# Patient Record
Sex: Female | Born: 1960 | Race: Black or African American | Hispanic: No | Marital: Married | Smoking: Current every day smoker
Health system: Southern US, Community
[De-identification: ages and names within clinical notes are randomized; demographics above are authoritative.]

## PROBLEM LIST (undated history)

## (undated) DIAGNOSIS — M810 Age-related osteoporosis without current pathological fracture: Secondary | ICD-10-CM

## (undated) DIAGNOSIS — M51369 Other intervertebral disc degeneration, lumbar region without mention of lumbar back pain or lower extremity pain: Secondary | ICD-10-CM

## (undated) DIAGNOSIS — C801 Malignant (primary) neoplasm, unspecified: Secondary | ICD-10-CM

## (undated) DIAGNOSIS — D573 Sickle-cell trait: Secondary | ICD-10-CM

## (undated) DIAGNOSIS — D649 Anemia, unspecified: Secondary | ICD-10-CM

## (undated) DIAGNOSIS — L0292 Furuncle, unspecified: Secondary | ICD-10-CM

## (undated) DIAGNOSIS — I10 Essential (primary) hypertension: Secondary | ICD-10-CM

## (undated) DIAGNOSIS — E7800 Pure hypercholesterolemia, unspecified: Secondary | ICD-10-CM

## (undated) DIAGNOSIS — M1990 Unspecified osteoarthritis, unspecified site: Secondary | ICD-10-CM

## (undated) DIAGNOSIS — L0293 Carbuncle, unspecified: Secondary | ICD-10-CM

## (undated) DIAGNOSIS — D759 Disease of blood and blood-forming organs, unspecified: Secondary | ICD-10-CM

## (undated) HISTORY — PX: BRAIN SURGERY: SHX531

## (undated) HISTORY — DX: Carbuncle, unspecified: L02.93

## (undated) HISTORY — DX: Furuncle, unspecified: L02.92

## (undated) HISTORY — PX: THERAPEUTIC ABORTION: SHX798

## (undated) HISTORY — DX: Unspecified osteoarthritis, unspecified site: M19.90

## (undated) HISTORY — DX: Anemia, unspecified: D64.9

## (undated) HISTORY — PX: APPENDECTOMY: SHX54

---

## 2001-01-21 ENCOUNTER — Encounter: Payer: Self-pay | Admitting: Emergency Medicine

## 2001-01-21 ENCOUNTER — Emergency Department (HOSPITAL_COMMUNITY): Admission: EM | Admit: 2001-01-21 | Discharge: 2001-01-21 | Payer: Self-pay | Admitting: Emergency Medicine

## 2001-11-25 ENCOUNTER — Emergency Department (HOSPITAL_COMMUNITY): Admission: EM | Admit: 2001-11-25 | Discharge: 2001-11-25 | Payer: Self-pay | Admitting: *Deleted

## 2002-02-03 ENCOUNTER — Encounter: Payer: Self-pay | Admitting: *Deleted

## 2002-02-03 ENCOUNTER — Emergency Department (HOSPITAL_COMMUNITY): Admission: EM | Admit: 2002-02-03 | Discharge: 2002-02-03 | Payer: Self-pay | Admitting: *Deleted

## 2002-02-11 ENCOUNTER — Encounter: Payer: Self-pay | Admitting: Obstetrics and Gynecology

## 2002-02-11 ENCOUNTER — Ambulatory Visit (HOSPITAL_COMMUNITY): Admission: RE | Admit: 2002-02-11 | Discharge: 2002-02-11 | Payer: Self-pay | Admitting: Obstetrics and Gynecology

## 2002-09-05 ENCOUNTER — Emergency Department (HOSPITAL_COMMUNITY): Admission: EM | Admit: 2002-09-05 | Discharge: 2002-09-05 | Payer: Self-pay | Admitting: Emergency Medicine

## 2002-12-15 ENCOUNTER — Emergency Department (HOSPITAL_COMMUNITY): Admission: EM | Admit: 2002-12-15 | Discharge: 2002-12-15 | Payer: Self-pay | Admitting: Emergency Medicine

## 2003-06-14 ENCOUNTER — Emergency Department (HOSPITAL_COMMUNITY): Admission: EM | Admit: 2003-06-14 | Discharge: 2003-06-14 | Payer: Self-pay | Admitting: Emergency Medicine

## 2003-08-06 ENCOUNTER — Emergency Department (HOSPITAL_COMMUNITY): Admission: EM | Admit: 2003-08-06 | Discharge: 2003-08-06 | Payer: Self-pay | Admitting: Emergency Medicine

## 2003-09-10 ENCOUNTER — Ambulatory Visit (HOSPITAL_COMMUNITY): Admission: RE | Admit: 2003-09-10 | Discharge: 2003-09-10 | Payer: Self-pay | Admitting: Internal Medicine

## 2003-12-22 ENCOUNTER — Emergency Department (HOSPITAL_COMMUNITY): Admission: EM | Admit: 2003-12-22 | Discharge: 2003-12-22 | Payer: Self-pay | Admitting: *Deleted

## 2004-04-03 ENCOUNTER — Emergency Department (HOSPITAL_COMMUNITY): Admission: EM | Admit: 2004-04-03 | Discharge: 2004-04-03 | Payer: Self-pay | Admitting: Emergency Medicine

## 2004-12-16 ENCOUNTER — Emergency Department (HOSPITAL_COMMUNITY): Admission: EM | Admit: 2004-12-16 | Discharge: 2004-12-16 | Payer: Self-pay | Admitting: *Deleted

## 2005-03-28 ENCOUNTER — Emergency Department (HOSPITAL_COMMUNITY): Admission: EM | Admit: 2005-03-28 | Discharge: 2005-03-28 | Payer: Self-pay | Admitting: Emergency Medicine

## 2005-09-25 ENCOUNTER — Emergency Department (HOSPITAL_COMMUNITY): Admission: EM | Admit: 2005-09-25 | Discharge: 2005-09-25 | Payer: Self-pay | Admitting: *Deleted

## 2005-11-23 ENCOUNTER — Emergency Department (HOSPITAL_COMMUNITY): Admission: EM | Admit: 2005-11-23 | Discharge: 2005-11-23 | Payer: Self-pay | Admitting: Emergency Medicine

## 2006-01-05 ENCOUNTER — Emergency Department (HOSPITAL_COMMUNITY): Admission: EM | Admit: 2006-01-05 | Discharge: 2006-01-05 | Payer: Self-pay | Admitting: Emergency Medicine
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
## 2006-02-02 ENCOUNTER — Emergency Department (HOSPITAL_COMMUNITY): Admission: EM | Admit: 2006-02-02 | Discharge: 2006-02-02 | Payer: Self-pay | Admitting: Emergency Medicine

## 2006-03-07 ENCOUNTER — Emergency Department (HOSPITAL_COMMUNITY): Admission: EM | Admit: 2006-03-07 | Discharge: 2006-03-07 | Payer: Self-pay | Admitting: Emergency Medicine

## 2006-03-28 ENCOUNTER — Emergency Department (HOSPITAL_COMMUNITY): Admission: EM | Admit: 2006-03-28 | Discharge: 2006-03-28 | Payer: Self-pay | Admitting: Emergency Medicine

## 2006-04-24 ENCOUNTER — Emergency Department (HOSPITAL_COMMUNITY): Admission: EM | Admit: 2006-04-24 | Discharge: 2006-04-24 | Payer: Self-pay | Admitting: Emergency Medicine

## 2006-06-17 ENCOUNTER — Emergency Department (HOSPITAL_COMMUNITY): Admission: EM | Admit: 2006-06-17 | Discharge: 2006-06-17 | Payer: Self-pay | Admitting: Emergency Medicine

## 2006-06-19 ENCOUNTER — Emergency Department (HOSPITAL_COMMUNITY): Admission: EM | Admit: 2006-06-19 | Discharge: 2006-06-19 | Payer: Self-pay | Admitting: Emergency Medicine

## 2006-06-30 ENCOUNTER — Emergency Department (HOSPITAL_COMMUNITY): Admission: EM | Admit: 2006-06-30 | Discharge: 2006-06-30 | Payer: Self-pay | Admitting: Emergency Medicine

## 2006-07-09 ENCOUNTER — Emergency Department (HOSPITAL_COMMUNITY): Admission: EM | Admit: 2006-07-09 | Discharge: 2006-07-09 | Payer: Self-pay | Admitting: Emergency Medicine

## 2006-07-16 ENCOUNTER — Emergency Department (HOSPITAL_COMMUNITY): Admission: EM | Admit: 2006-07-16 | Discharge: 2006-07-16 | Payer: Self-pay | Admitting: Emergency Medicine

## 2006-07-17 ENCOUNTER — Emergency Department (HOSPITAL_COMMUNITY): Admission: EM | Admit: 2006-07-17 | Discharge: 2006-07-17 | Payer: Self-pay | Admitting: Emergency Medicine

## 2006-07-25 ENCOUNTER — Emergency Department (HOSPITAL_COMMUNITY): Admission: EM | Admit: 2006-07-25 | Discharge: 2006-07-25 | Payer: Self-pay | Admitting: Emergency Medicine

## 2006-08-12 ENCOUNTER — Emergency Department (HOSPITAL_COMMUNITY): Admission: EM | Admit: 2006-08-12 | Discharge: 2006-08-12 | Payer: Self-pay | Admitting: Emergency Medicine

## 2006-08-18 ENCOUNTER — Emergency Department (HOSPITAL_COMMUNITY): Admission: EM | Admit: 2006-08-18 | Discharge: 2006-08-18 | Payer: Self-pay | Admitting: Emergency Medicine

## 2006-09-04 ENCOUNTER — Ambulatory Visit (HOSPITAL_COMMUNITY): Admission: RE | Admit: 2006-09-04 | Discharge: 2006-09-04 | Payer: Self-pay | Admitting: Family Medicine

## 2006-10-08 ENCOUNTER — Emergency Department (HOSPITAL_COMMUNITY): Admission: EM | Admit: 2006-10-08 | Discharge: 2006-10-08 | Payer: Self-pay | Admitting: Emergency Medicine

## 2006-12-29 ENCOUNTER — Emergency Department (HOSPITAL_COMMUNITY): Admission: EM | Admit: 2006-12-29 | Discharge: 2006-12-29 | Payer: Self-pay | Admitting: Emergency Medicine

## 2007-02-03 ENCOUNTER — Emergency Department (HOSPITAL_COMMUNITY): Admission: EM | Admit: 2007-02-03 | Discharge: 2007-02-03 | Payer: Self-pay | Admitting: Emergency Medicine

## 2007-03-05 ENCOUNTER — Observation Stay (HOSPITAL_COMMUNITY): Admission: RE | Admit: 2007-03-05 | Discharge: 2007-03-06 | Payer: Self-pay | Admitting: General Surgery

## 2007-03-05 ENCOUNTER — Encounter (INDEPENDENT_AMBULATORY_CARE_PROVIDER_SITE_OTHER): Payer: Self-pay | Admitting: General Surgery

## 2007-03-07 ENCOUNTER — Encounter (HOSPITAL_COMMUNITY): Admission: RE | Admit: 2007-03-07 | Discharge: 2007-04-02 | Payer: Self-pay | Admitting: General Surgery

## 2007-04-05 ENCOUNTER — Emergency Department (HOSPITAL_COMMUNITY): Admission: EM | Admit: 2007-04-05 | Discharge: 2007-04-05 | Payer: Self-pay | Admitting: Emergency Medicine

## 2007-05-01 ENCOUNTER — Emergency Department (HOSPITAL_COMMUNITY): Admission: EM | Admit: 2007-05-01 | Discharge: 2007-05-01 | Payer: Self-pay | Admitting: Emergency Medicine

## 2007-10-21 ENCOUNTER — Emergency Department (HOSPITAL_COMMUNITY): Admission: EM | Admit: 2007-10-21 | Discharge: 2007-10-21 | Payer: Self-pay | Admitting: Emergency Medicine

## 2007-11-08 ENCOUNTER — Emergency Department (HOSPITAL_COMMUNITY): Admission: EM | Admit: 2007-11-08 | Discharge: 2007-11-08 | Payer: Self-pay | Admitting: Emergency Medicine

## 2007-12-10 ENCOUNTER — Emergency Department (HOSPITAL_COMMUNITY): Admission: EM | Admit: 2007-12-10 | Discharge: 2007-12-10 | Payer: Self-pay | Admitting: Emergency Medicine

## 2008-01-19 ENCOUNTER — Emergency Department (HOSPITAL_COMMUNITY): Admission: EM | Admit: 2008-01-19 | Discharge: 2008-01-19 | Payer: Self-pay | Admitting: Emergency Medicine

## 2008-02-23 ENCOUNTER — Emergency Department (HOSPITAL_COMMUNITY): Admission: EM | Admit: 2008-02-23 | Discharge: 2008-02-23 | Payer: Self-pay | Admitting: Emergency Medicine

## 2008-03-11 ENCOUNTER — Emergency Department (HOSPITAL_COMMUNITY): Admission: EM | Admit: 2008-03-11 | Discharge: 2008-03-11 | Payer: Self-pay | Admitting: Emergency Medicine

## 2008-03-13 ENCOUNTER — Emergency Department (HOSPITAL_COMMUNITY): Admission: EM | Admit: 2008-03-13 | Discharge: 2008-03-13 | Payer: Self-pay | Admitting: Emergency Medicine

## 2008-04-22 ENCOUNTER — Emergency Department (HOSPITAL_COMMUNITY): Admission: EM | Admit: 2008-04-22 | Discharge: 2008-04-22 | Payer: Self-pay | Admitting: Emergency Medicine

## 2008-04-22 IMAGING — CT CT CHEST W/ CM
1 series · 15 of 33 positions shown, 19 images · IV contrast (Omnipaque 300)
Comparison: Prior plain films including earlier today.

CLINICAL DATA: Chest tightness.  Cough.  Headache.  Possible
density in the right lung on the plain film.  Smoker.

CT CHEST WITH CONTRAST
TECHNIQUE: Multidetector CT imaging of the chest was performed
following the standard protocol during bolus administration of
intravenous contrast.
Contrast: 80 ml [A2]

[Series 2: chestroutine 5.0 b40f · axial · 0.70mm/px · z∈[-333,-68]mm · 15 of 63 slices shown, 19 images]
[im 5/63  mediastinal]
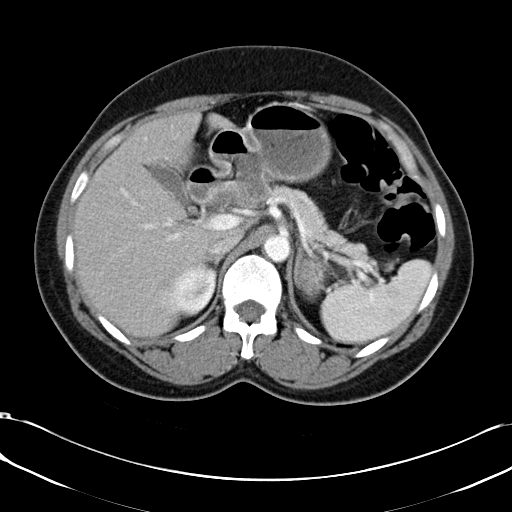
[im 5/63  lung]
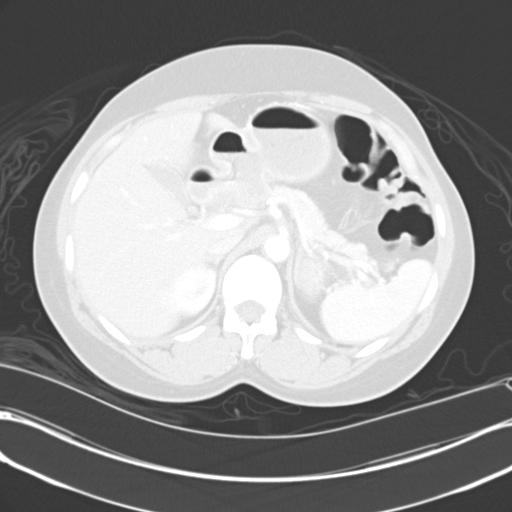
[im 10/63  lung]
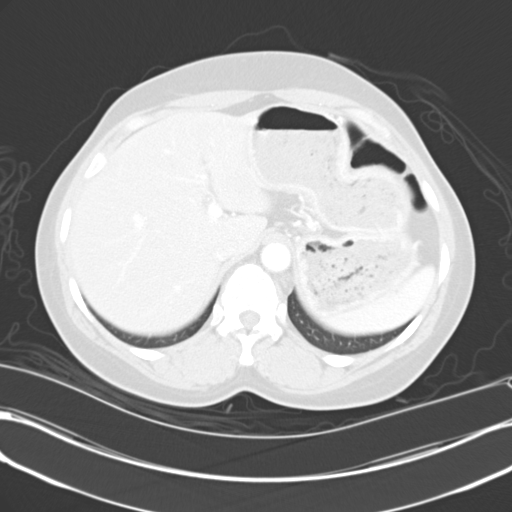
[im 13/63  lung]
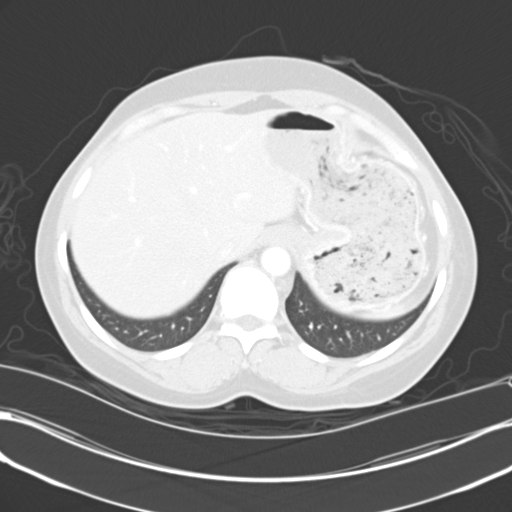
[im 17/63  lung]
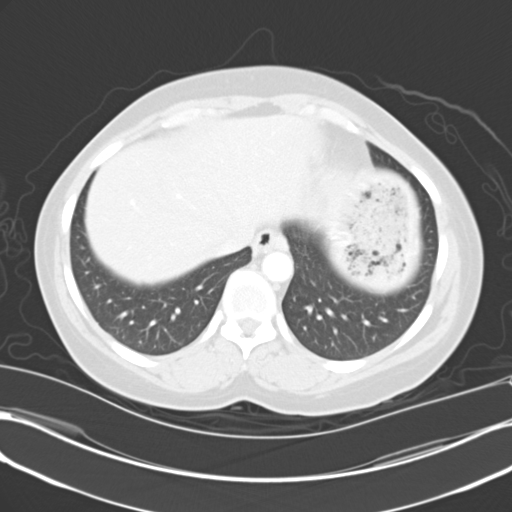
[im 21/63  mediastinal]
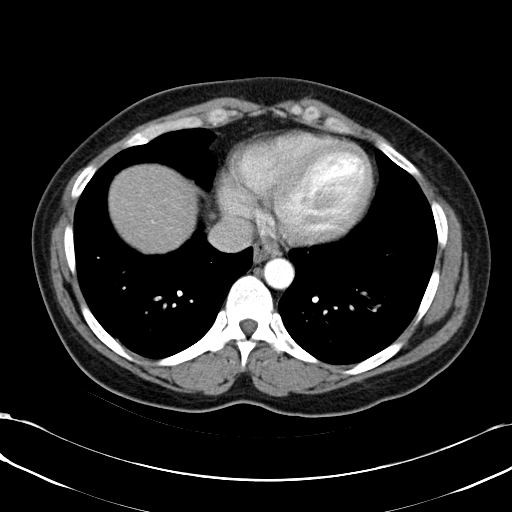
[im 21/63  lung]
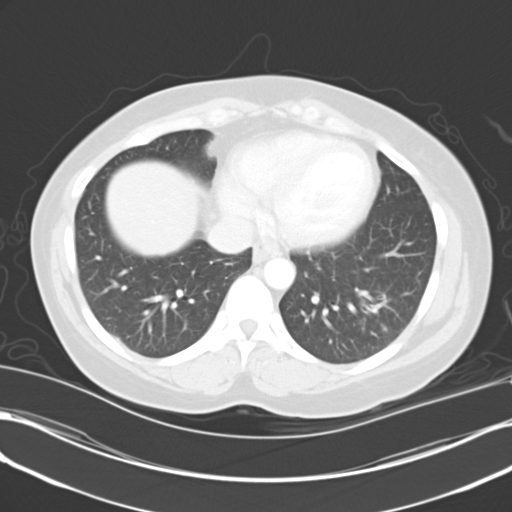
[im 25/63  lung]
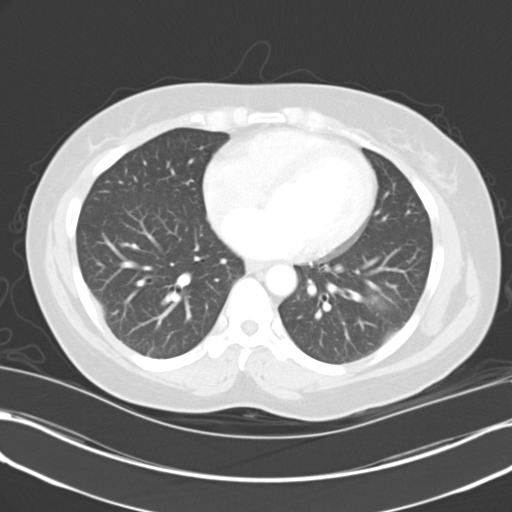
[im 28/63  lung]
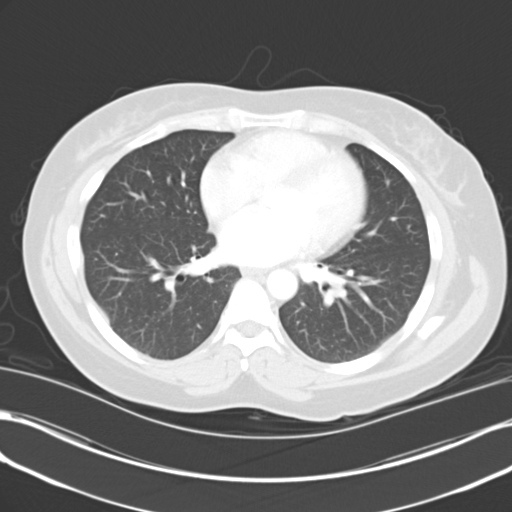
[im 33/63  lung]
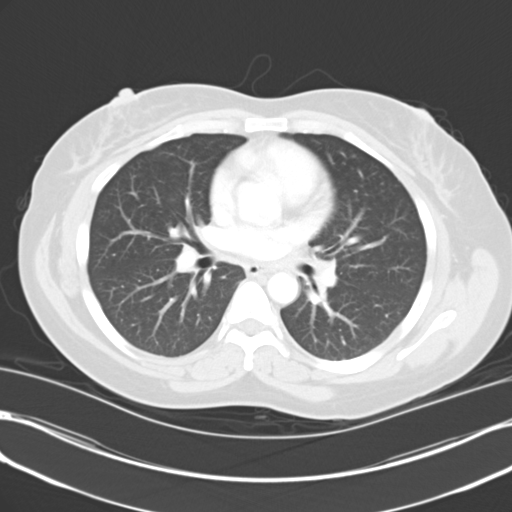
[im 35/63  mediastinal]
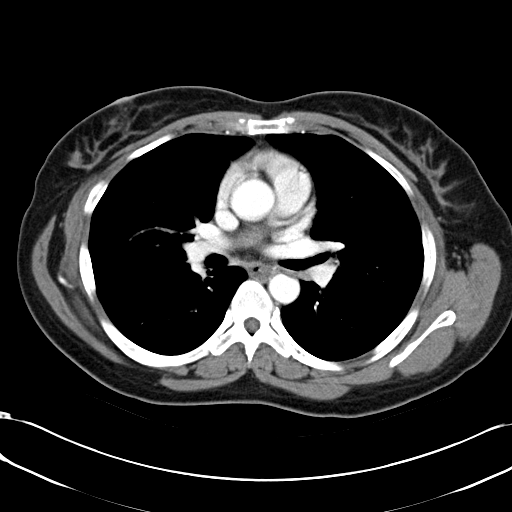
[im 35/63  lung]
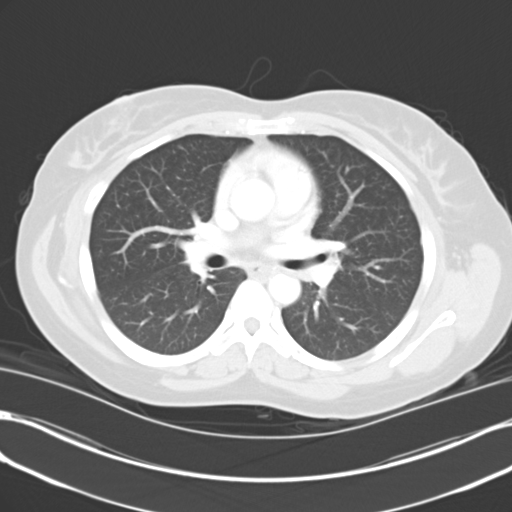
[im 38/63  lung]
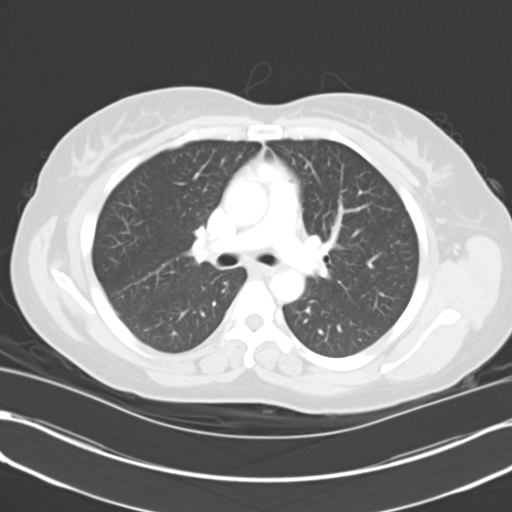
[im 42/63  lung]
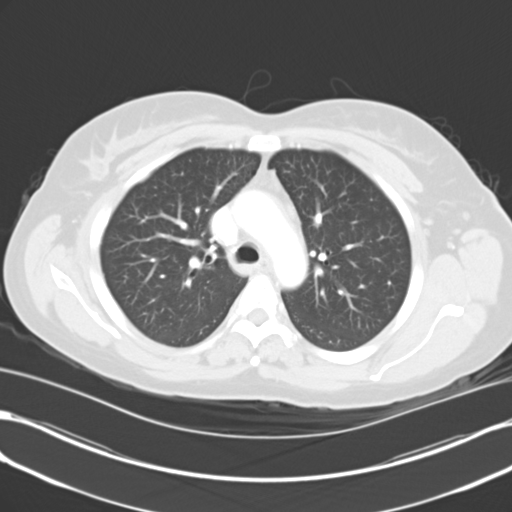
[im 46/63  lung]
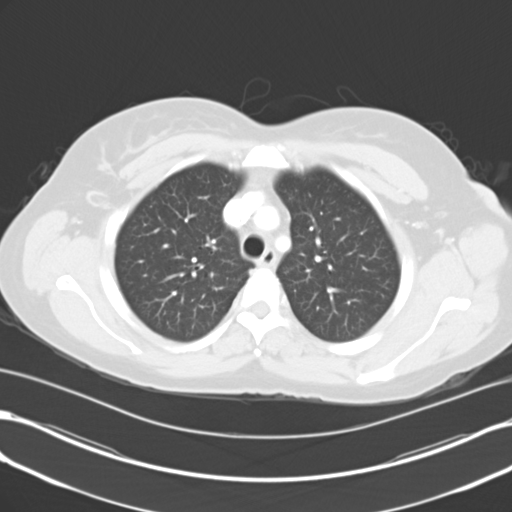
[im 50/63  mediastinal]
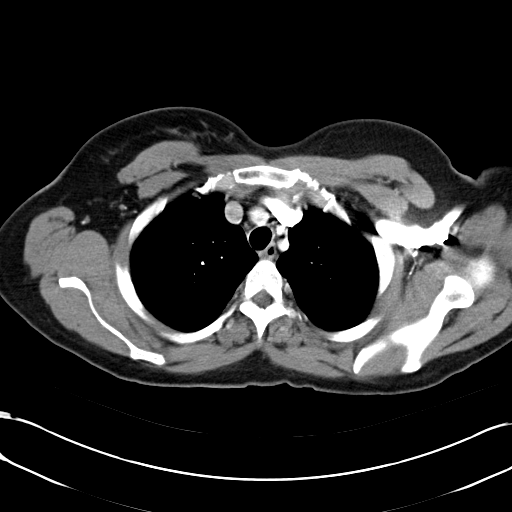
[im 50/63  lung]
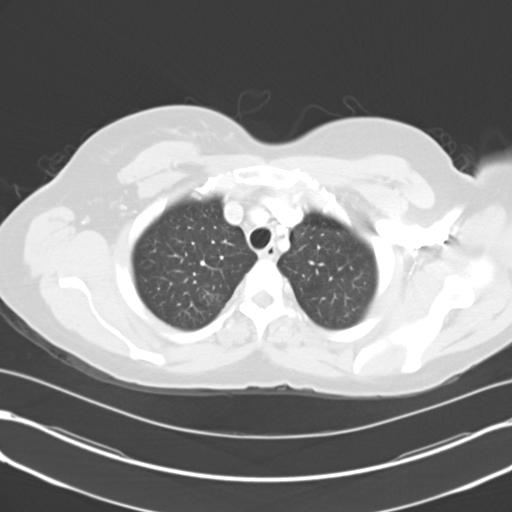
[im 53/63  lung]
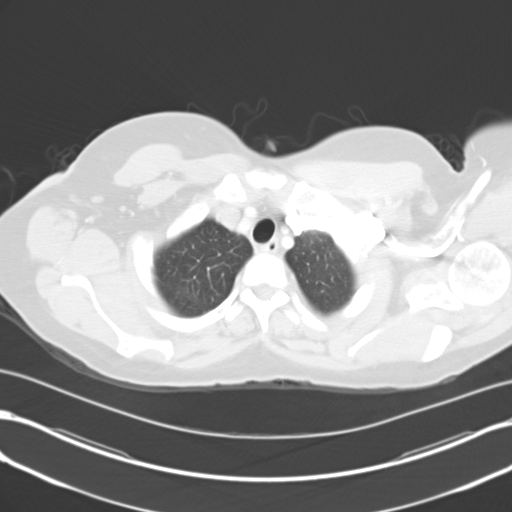
[im 58/63  lung]
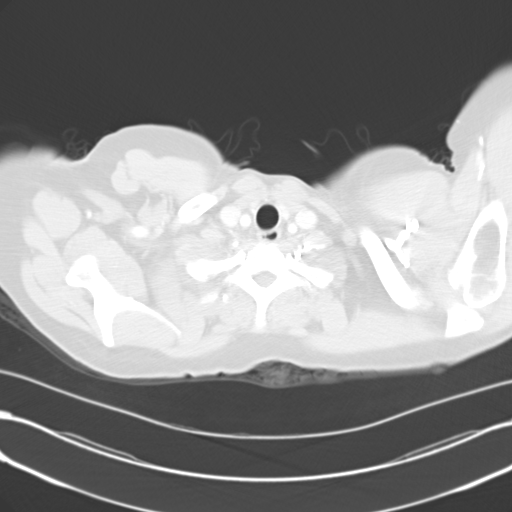

[15 of 33 positions shown; findings below may reference images not displayed]

FINDINGS: Lung windows demonstrate clustered peribronchovascular
nodular and ground-glass opacity in the left lower lobe on image
41.
No right-sided nodule or mass.

Soft tissue windows demonstrate early atherosclerosis in the
descending thoracic aorta.  Heart size upper limits of normal. No
pericardial or pleural effusion.  The plain film abnormality is
secondary to an area of pleural thickening on image 38.  Similar
findings more posteriorly inferiorly on image 42. No mediastinal or
hilar adenopathy. Mildly prominent para esophageal lymph node
measuring 7 mm on image 47.  Most likely reactive.

Limited abdominal imaging demonstrates mild fatty infiltration of
the liver. No acute osseous abnormality.
IMPRESSION: 1.  The plain film abnormality is secondary to 1 of 2 foci of mild
right-sided pleural thickening.  No evidence of right-sided
pulmonary nodule or mass.
2.  Mild left lower lobe airspace opacity and nodularity is most
consistent with infection.  Consider atypical etiologies.
3.  Fatty infiltration of the liver.

## 2008-04-22 IMAGING — CR DG CHEST 2V
2 series · 2 of 2 positions shown · non-contrast
Comparison: [DATE]

CLINICAL DATA: Chest pain and cough

CHEST - 2 VIEW

[view not recorded (1 of 2)]
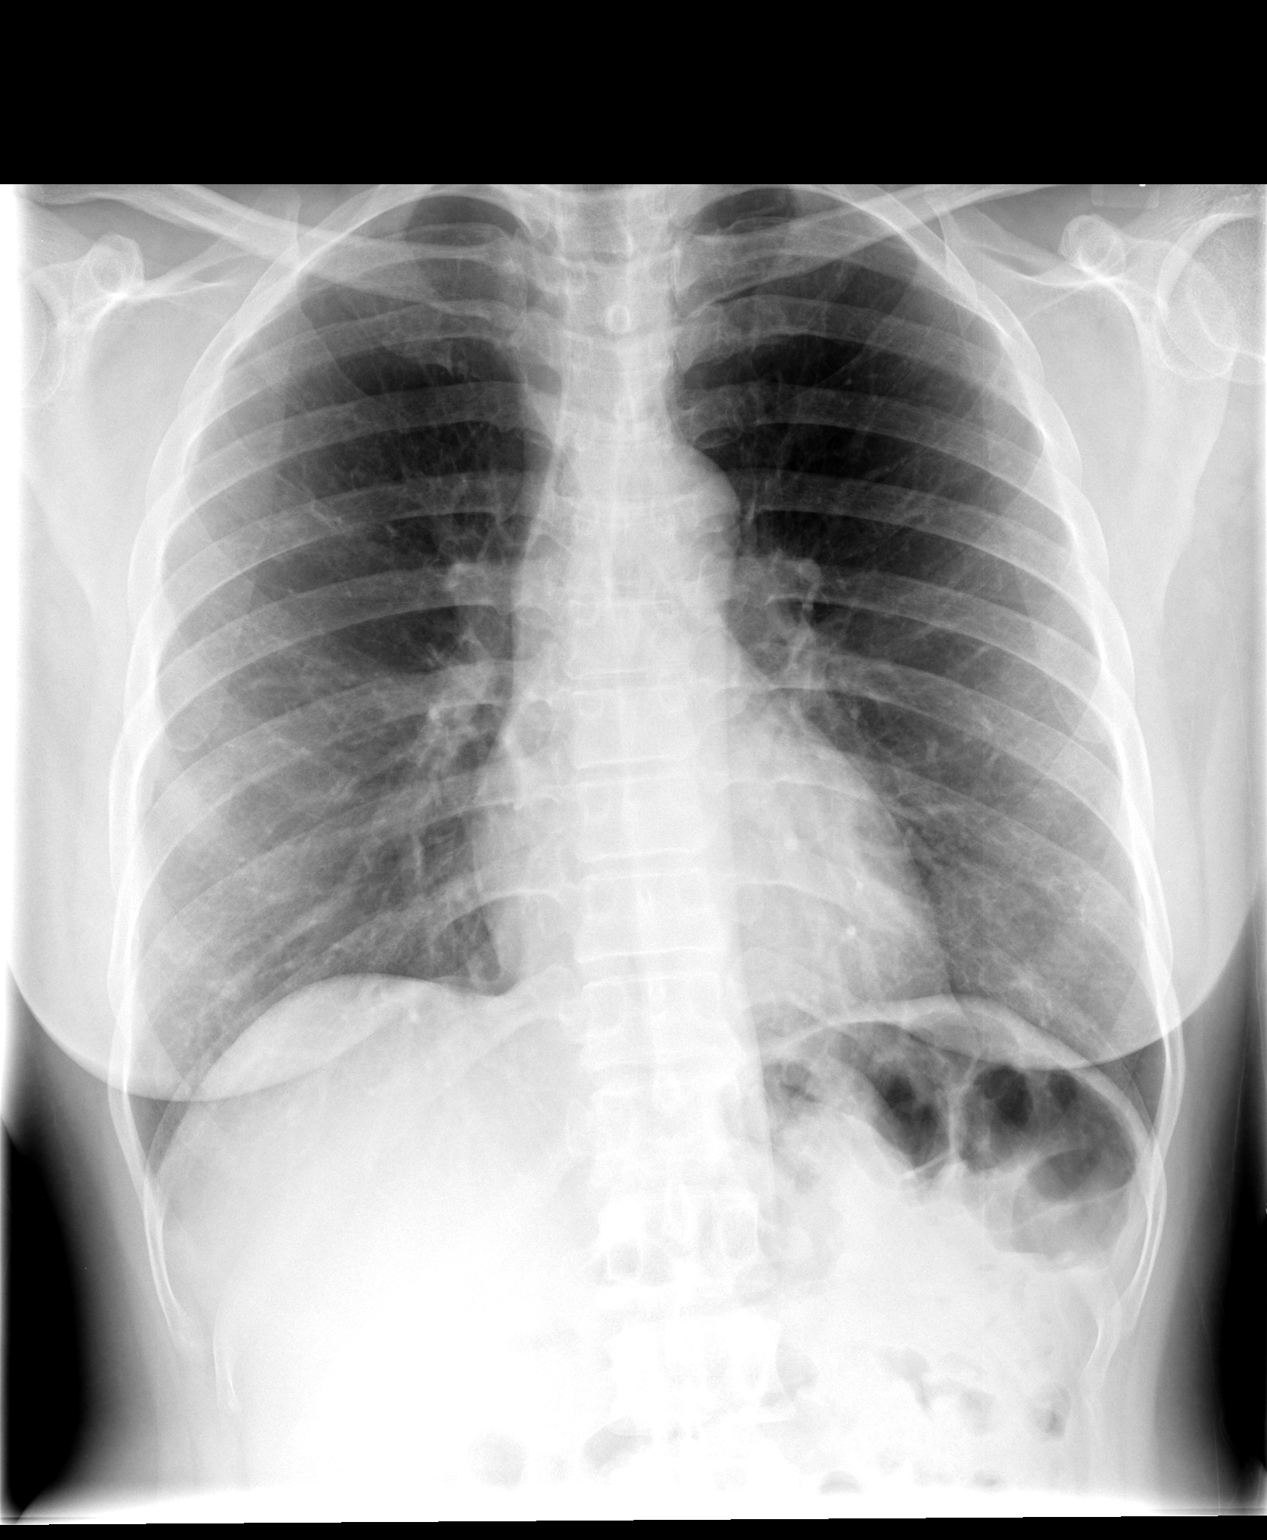

[view not recorded (2 of 2)]
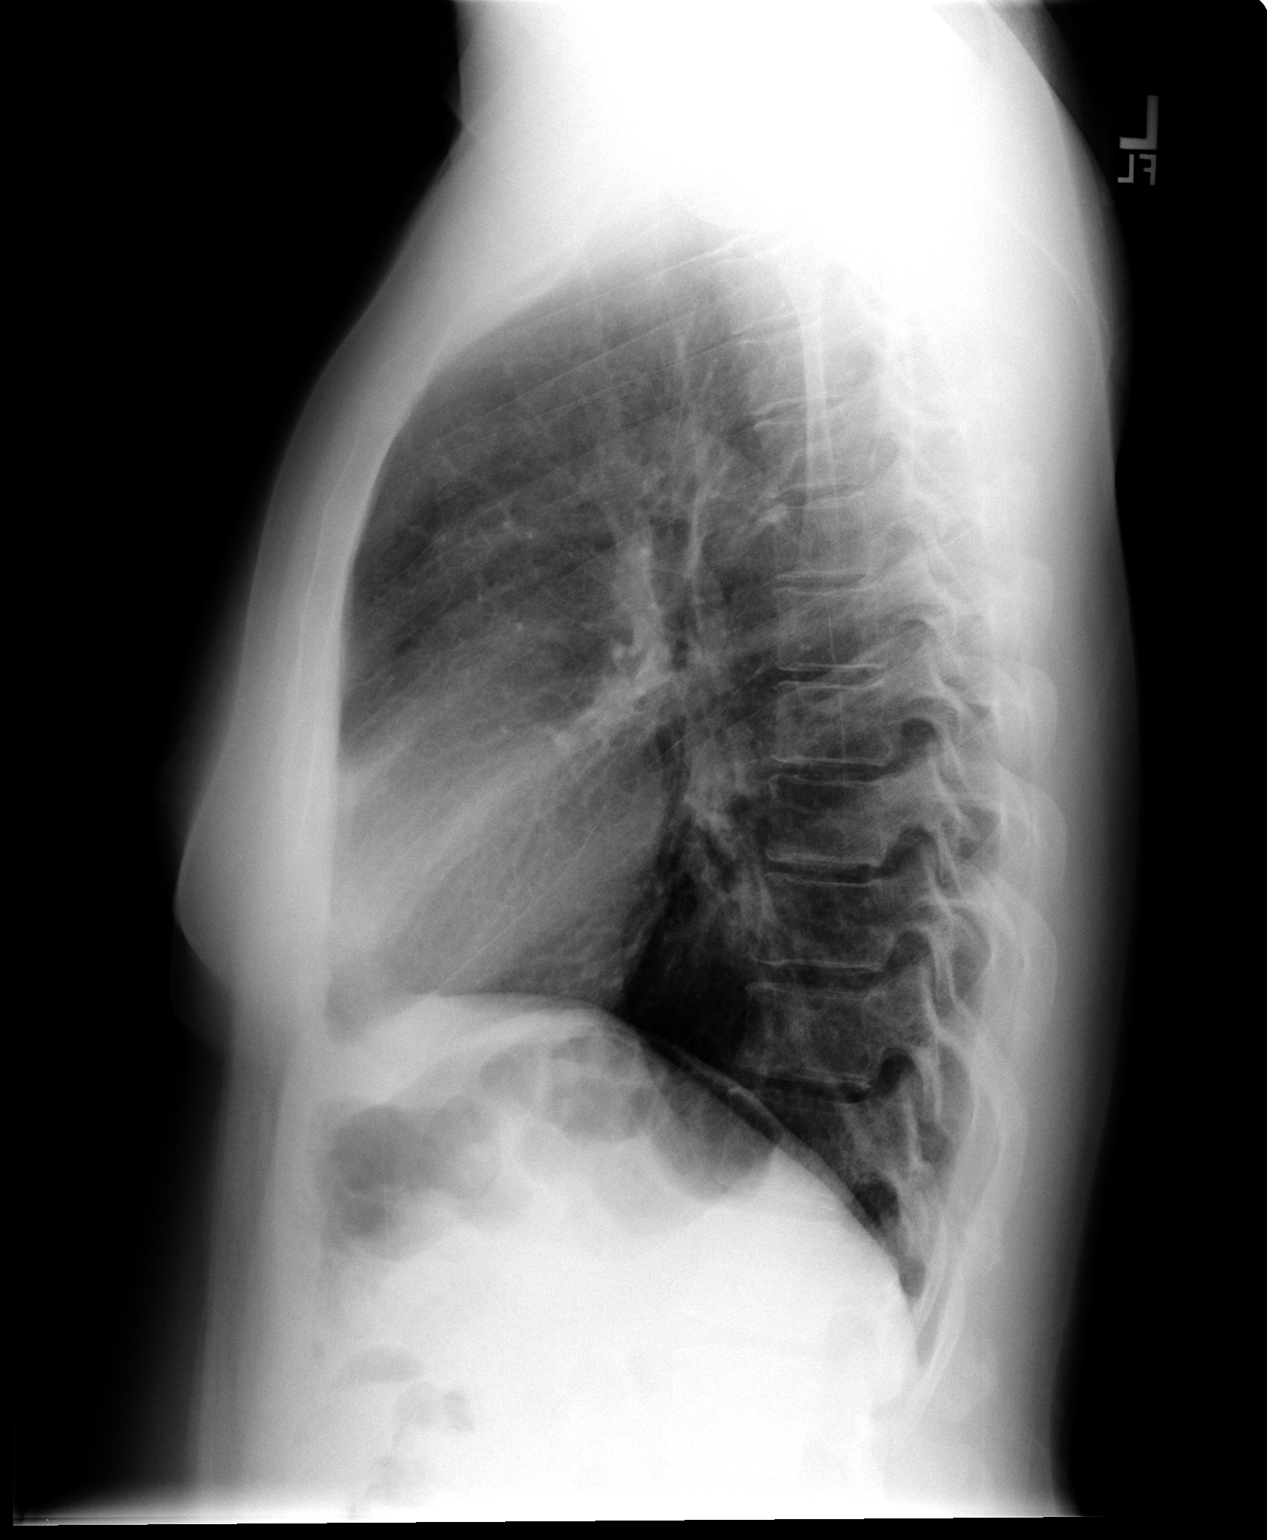

[2 of 2 positions shown; findings below may reference images not displayed]

FINDINGS: The cardiac silhouette, mediastinal and hilar contours
are within normal limits and stable.  No infiltrates, edema or
effusions.  There is a persistent area of density in the right
lower lung laterally.  Given this patient's history of smoking the
chest CT may be helpful for further evaluation.  The bony thorax is
intact.
IMPRESSION: 1.  No acute cardiopulmonary findings.
2.  Persistent vague density in the right lung.  Given history of
smoking a chest CT is suggested for further evaluation.

## 2008-09-08 ENCOUNTER — Emergency Department (HOSPITAL_COMMUNITY): Admission: EM | Admit: 2008-09-08 | Discharge: 2008-09-08 | Payer: Self-pay | Admitting: Emergency Medicine

## 2008-12-13 ENCOUNTER — Emergency Department (HOSPITAL_COMMUNITY): Admission: EM | Admit: 2008-12-13 | Discharge: 2008-12-13 | Payer: Self-pay | Admitting: Emergency Medicine

## 2008-12-24 ENCOUNTER — Emergency Department (HOSPITAL_COMMUNITY): Admission: EM | Admit: 2008-12-24 | Discharge: 2008-12-24 | Payer: Self-pay | Admitting: Emergency Medicine

## 2009-02-07 ENCOUNTER — Emergency Department (HOSPITAL_COMMUNITY): Admission: EM | Admit: 2009-02-07 | Discharge: 2009-02-07 | Payer: Self-pay | Admitting: Emergency Medicine

## 2009-02-17 ENCOUNTER — Emergency Department (HOSPITAL_COMMUNITY): Admission: EM | Admit: 2009-02-17 | Discharge: 2009-02-17 | Payer: Self-pay | Admitting: Emergency Medicine

## 2009-05-23 ENCOUNTER — Emergency Department (HOSPITAL_COMMUNITY): Admission: EM | Admit: 2009-05-23 | Discharge: 2009-05-23 | Payer: Self-pay | Admitting: Emergency Medicine

## 2009-06-24 ENCOUNTER — Emergency Department (HOSPITAL_COMMUNITY): Admission: EM | Admit: 2009-06-24 | Discharge: 2009-06-24 | Payer: Self-pay | Admitting: Emergency Medicine

## 2009-09-16 ENCOUNTER — Emergency Department (HOSPITAL_COMMUNITY): Admission: EM | Admit: 2009-09-16 | Discharge: 2009-09-16 | Payer: Self-pay | Admitting: Emergency Medicine

## 2009-09-29 ENCOUNTER — Emergency Department (HOSPITAL_COMMUNITY): Admission: EM | Admit: 2009-09-29 | Discharge: 2009-09-29 | Payer: Self-pay | Admitting: Emergency Medicine

## 2009-12-09 ENCOUNTER — Emergency Department (HOSPITAL_COMMUNITY): Admission: EM | Admit: 2009-12-09 | Discharge: 2009-12-09 | Payer: Self-pay | Admitting: Emergency Medicine

## 2009-12-18 ENCOUNTER — Emergency Department (HOSPITAL_COMMUNITY): Admission: EM | Admit: 2009-12-18 | Discharge: 2009-12-18 | Payer: Self-pay | Admitting: Emergency Medicine

## 2009-12-21 ENCOUNTER — Ambulatory Visit (HOSPITAL_COMMUNITY): Admission: RE | Admit: 2009-12-21 | Discharge: 2009-12-21 | Payer: Self-pay | Admitting: General Surgery

## 2009-12-21 HISTORY — PX: INCISION AND DRAINAGE PERIRECTAL ABSCESS: SHX1804

## 2010-02-09 ENCOUNTER — Ambulatory Visit (HOSPITAL_COMMUNITY): Admission: RE | Admit: 2010-02-09 | Payer: Self-pay | Admitting: Family Medicine

## 2010-02-12 ENCOUNTER — Emergency Department (HOSPITAL_COMMUNITY): Admission: EM | Admit: 2010-02-12 | Discharge: 2010-02-12 | Payer: Self-pay | Admitting: Emergency Medicine

## 2010-02-26 ENCOUNTER — Emergency Department (HOSPITAL_COMMUNITY): Admission: EM | Admit: 2010-02-26 | Discharge: 2010-02-26 | Payer: Self-pay | Admitting: Emergency Medicine

## 2010-03-17 ENCOUNTER — Emergency Department (HOSPITAL_COMMUNITY)
Admission: EM | Admit: 2010-03-17 | Discharge: 2010-03-17 | Payer: Self-pay | Source: Home / Self Care | Admitting: Emergency Medicine

## 2010-03-28 ENCOUNTER — Emergency Department (HOSPITAL_COMMUNITY)
Admission: EM | Admit: 2010-03-28 | Discharge: 2010-03-28 | Payer: Self-pay | Source: Home / Self Care | Admitting: Emergency Medicine

## 2010-04-19 ENCOUNTER — Emergency Department (HOSPITAL_COMMUNITY)
Admission: EM | Admit: 2010-04-19 | Discharge: 2010-04-19 | Payer: Self-pay | Source: Home / Self Care | Admitting: Emergency Medicine

## 2010-04-24 ENCOUNTER — Encounter: Payer: Self-pay | Admitting: Obstetrics and Gynecology

## 2010-04-25 ENCOUNTER — Other Ambulatory Visit: Payer: Self-pay

## 2010-04-25 DIAGNOSIS — Z1239 Encounter for other screening for malignant neoplasm of breast: Secondary | ICD-10-CM

## 2010-05-04 ENCOUNTER — Ambulatory Visit (HOSPITAL_COMMUNITY)
Admission: RE | Admit: 2010-05-04 | Discharge: 2010-05-04 | Disposition: A | Discharge status: Home / Self Care | Payer: Self-pay | Source: Ambulatory Visit

## 2010-05-04 DIAGNOSIS — Z1231 Encounter for screening mammogram for malignant neoplasm of breast: Secondary | ICD-10-CM | POA: Insufficient documentation

## 2010-05-04 DIAGNOSIS — Z1239 Encounter for other screening for malignant neoplasm of breast: Secondary | ICD-10-CM

## 2010-05-19 ENCOUNTER — Emergency Department (HOSPITAL_COMMUNITY)
Admission: EM | Admit: 2010-05-19 | Discharge: 2010-05-19 | Disposition: A | Discharge status: Home / Self Care | Payer: Medicaid Other | Attending: Emergency Medicine | Admitting: Emergency Medicine

## 2010-05-19 DIAGNOSIS — L0231 Cutaneous abscess of buttock: Secondary | ICD-10-CM | POA: Insufficient documentation

## 2010-05-19 DIAGNOSIS — Z8614 Personal history of Methicillin resistant Staphylococcus aureus infection: Secondary | ICD-10-CM | POA: Insufficient documentation

## 2010-05-30 ENCOUNTER — Emergency Department (HOSPITAL_COMMUNITY)
Admission: EM | Admit: 2010-05-30 | Discharge: 2010-05-30 | Disposition: A | Discharge status: Home / Self Care | Payer: Medicaid Other | Attending: Emergency Medicine | Admitting: Emergency Medicine

## 2010-05-30 ENCOUNTER — Emergency Department (HOSPITAL_COMMUNITY): Payer: Medicaid Other

## 2010-05-30 DIAGNOSIS — Z79899 Other long term (current) drug therapy: Secondary | ICD-10-CM | POA: Insufficient documentation

## 2010-05-30 DIAGNOSIS — M79609 Pain in unspecified limb: Secondary | ICD-10-CM | POA: Insufficient documentation

## 2010-05-30 IMAGING — CR DG FOOT COMPLETE 3+V*L*
3 series · 3 of 3 positions shown · non-contrast
Comparison: None.

CLINICAL DATA: Foot pain.  No known injury.

LEFT FOOT - COMPLETE 3+ VIEW

[view not recorded (1 of 3)]
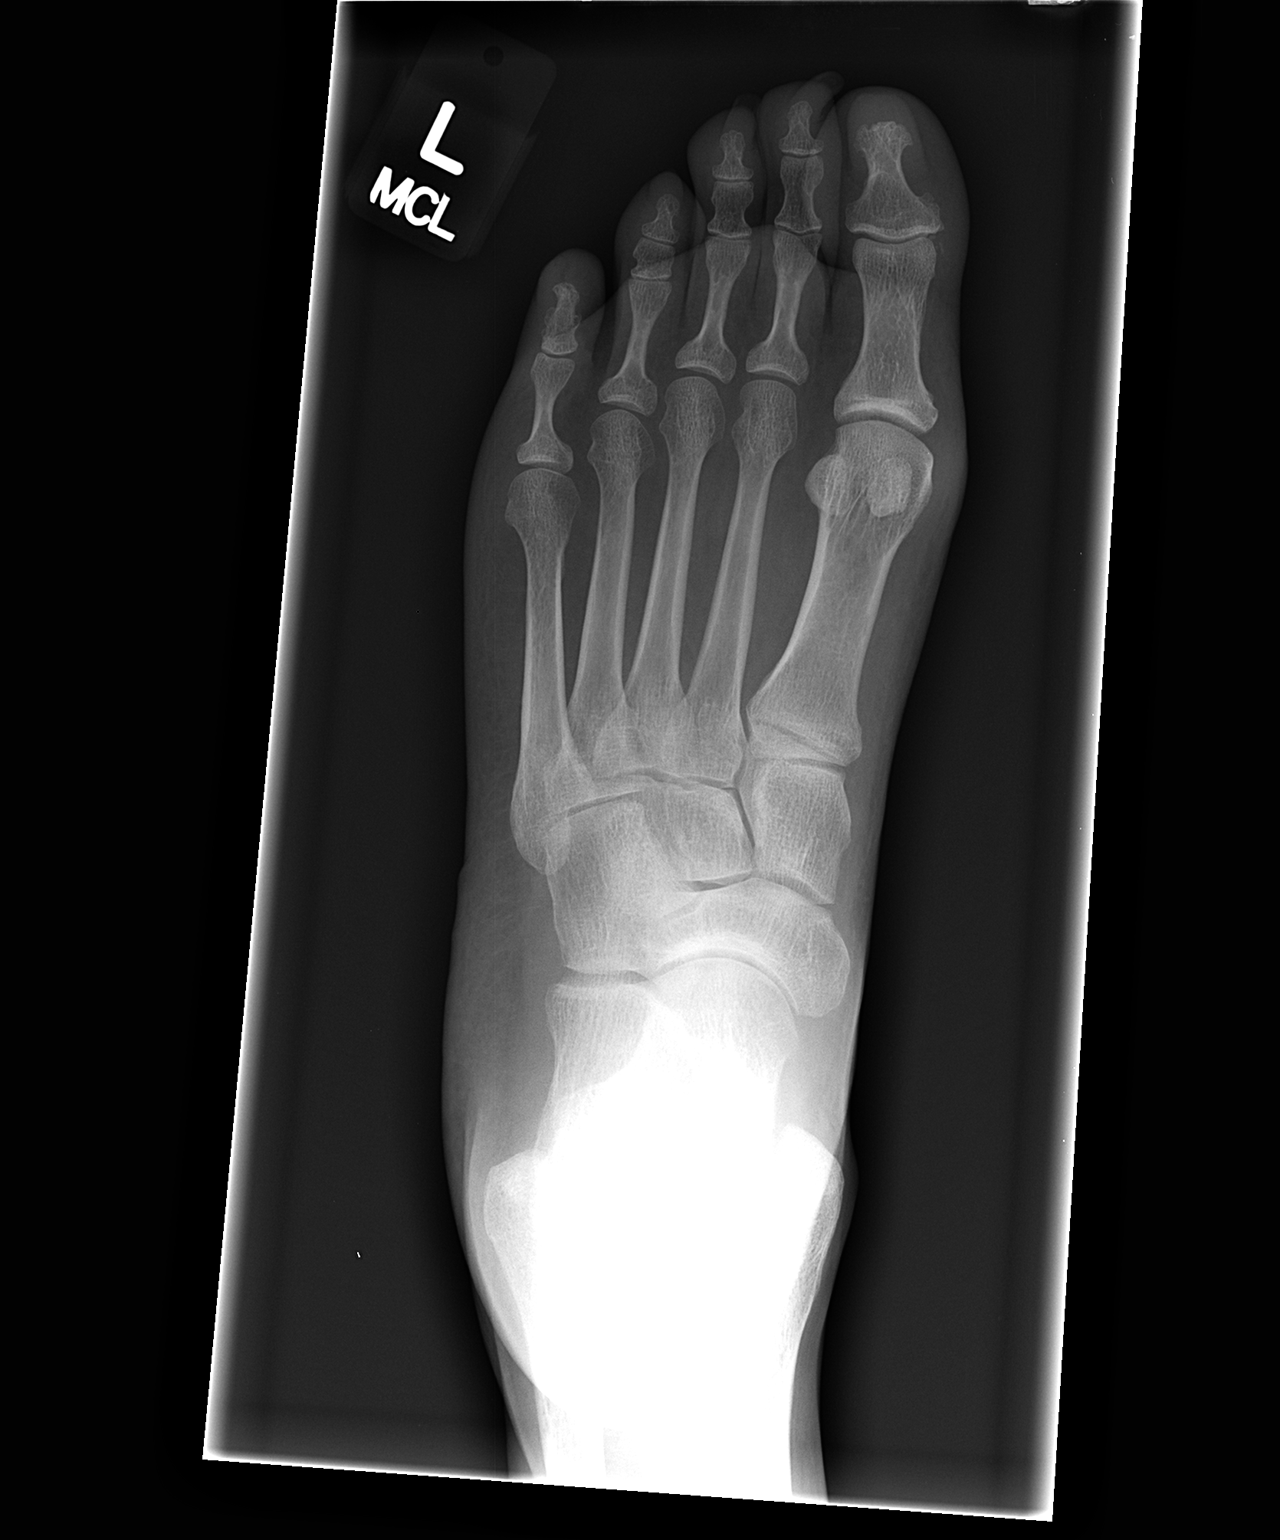

[view not recorded (2 of 3)]
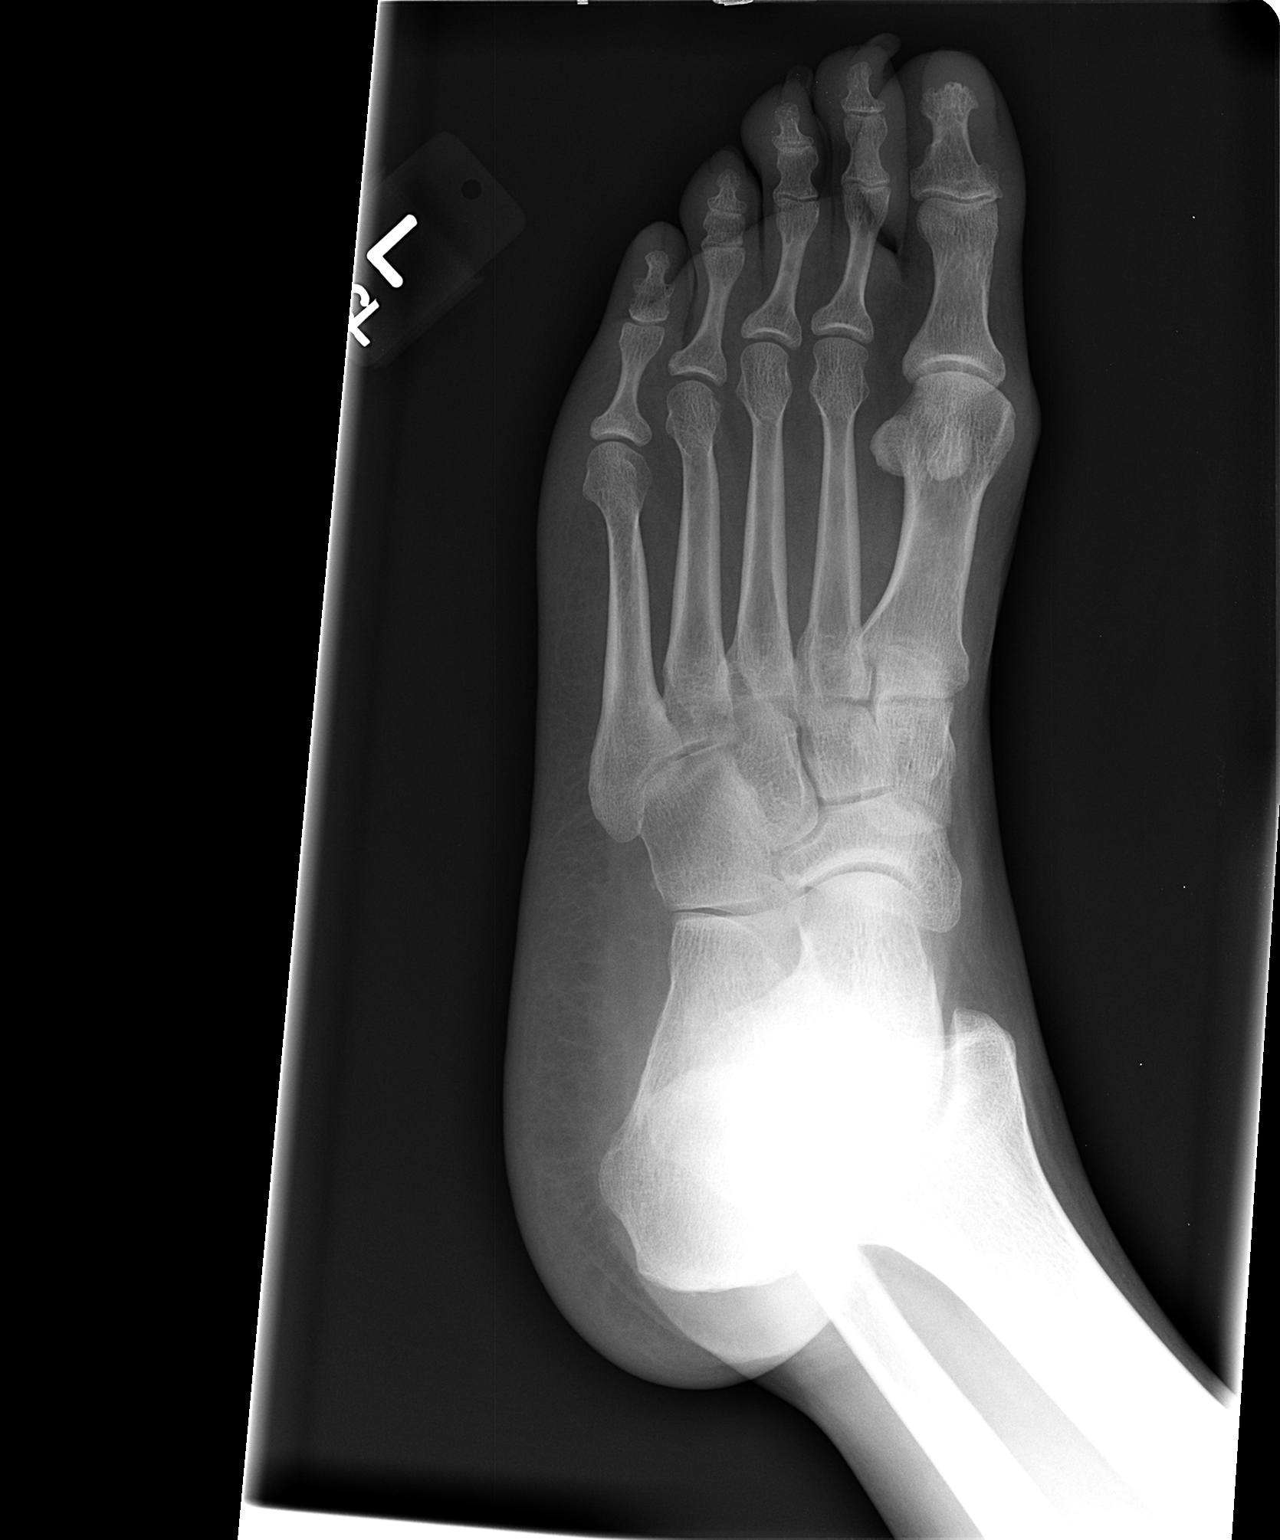

[view not recorded (3 of 3)]
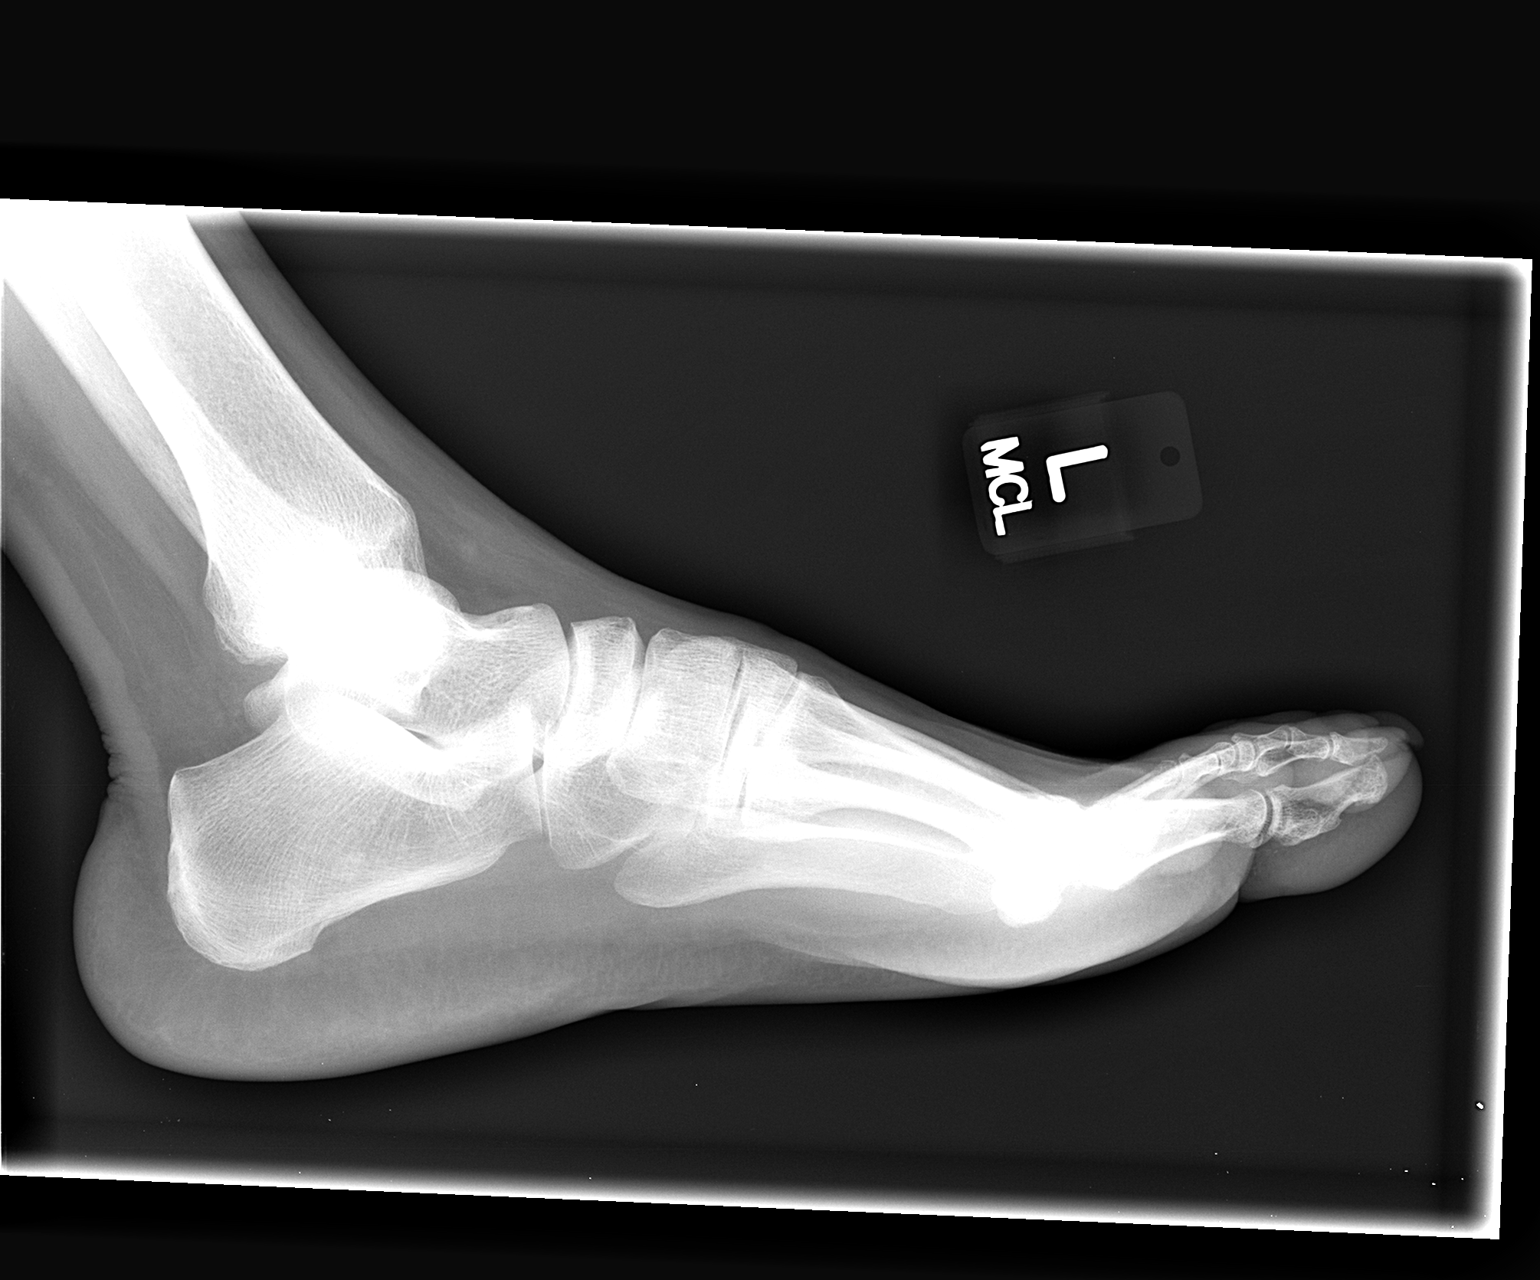

[3 of 3 positions shown; findings below may reference images not displayed]

FINDINGS: Anatomic alignment of the left foot.  Soft tissues appear
within normal limits.  There is no fracture.
IMPRESSION: Negative radiographs of the left foot.

## 2010-06-03 ENCOUNTER — Emergency Department (HOSPITAL_COMMUNITY)
Admission: EM | Admit: 2010-06-03 | Discharge: 2010-06-03 | Disposition: A | Discharge status: Home / Self Care | Payer: Medicaid Other | Attending: Emergency Medicine | Admitting: Emergency Medicine

## 2010-06-03 DIAGNOSIS — N764 Abscess of vulva: Secondary | ICD-10-CM | POA: Insufficient documentation

## 2010-06-12 LAB — GC/CHLAMYDIA PROBE AMP, GENITAL
Chlamydia, DNA Probe: NEGATIVE
GC Probe Amp, Genital: NEGATIVE

## 2010-06-12 LAB — URINE MICROSCOPIC-ADD ON

## 2010-06-12 LAB — URINALYSIS, ROUTINE W REFLEX MICROSCOPIC
Bilirubin Urine: NEGATIVE
Glucose, UA: NEGATIVE mg/dL
Leukocytes, UA: NEGATIVE
Protein, ur: NEGATIVE mg/dL

## 2010-06-12 LAB — WET PREP, GENITAL
Trich, Wet Prep: NONE SEEN
Yeast Wet Prep HPF POC: NONE SEEN

## 2010-06-15 LAB — ANAEROBIC CULTURE

## 2010-06-15 LAB — BASIC METABOLIC PANEL
BUN: 6 mg/dL (ref 6–23)
CO2: 26 mEq/L (ref 19–32)
Calcium: 9.3 mg/dL (ref 8.4–10.5)
Chloride: 103 mEq/L (ref 96–112)
Creatinine, Ser: 0.73 mg/dL (ref 0.4–1.2)
GFR calc Af Amer: >60 mL/min (ref >60)
Glucose, Bld: 90 mg/dL (ref 70–99)
Potassium: 3.6 mEq/L (ref 3.5–5.1)

## 2010-06-15 LAB — CBC
HCT: 35.2 % — ABNORMAL LOW (ref 36.0–46.0)
MCH: 29.6 pg (ref 26.0–34.0)
MCV: 87.1 fL (ref 78.0–100.0)
RBC: 4.03 MIL/uL (ref 3.87–5.11)

## 2010-06-15 LAB — DIFFERENTIAL
Eosinophils Absolute: 0.1 10*3/uL (ref 0.0–0.7)
Lymphocytes Relative: 30 % (ref 12–46)
Lymphs Abs: 1.9 10*3/uL (ref 0.7–4.0)
Monocytes Relative: 9 % (ref 3–12)
Neutro Abs: 3.8 10*3/uL (ref 1.7–7.7)
Neutrophils Relative %: 59 % (ref 43–77)

## 2010-06-15 LAB — CULTURE, ROUTINE-ABSCESS: Culture: NO GROWTH

## 2010-06-15 LAB — SURGICAL PCR SCREEN: MRSA, PCR: NEGATIVE

## 2010-06-15 LAB — RAPID STREP SCREEN (MED CTR MEBANE ONLY): Streptococcus, Group A Screen (Direct): NEGATIVE

## 2010-06-24 ENCOUNTER — Emergency Department (HOSPITAL_COMMUNITY)
Admission: EM | Admit: 2010-06-24 | Discharge: 2010-06-24 | Disposition: A | Discharge status: Home / Self Care | Payer: Medicaid Other | Attending: Emergency Medicine | Admitting: Emergency Medicine

## 2010-06-24 DIAGNOSIS — L02219 Cutaneous abscess of trunk, unspecified: Secondary | ICD-10-CM | POA: Insufficient documentation

## 2010-06-24 DIAGNOSIS — L03319 Cellulitis of trunk, unspecified: Secondary | ICD-10-CM | POA: Insufficient documentation

## 2010-07-07 LAB — BASIC METABOLIC PANEL
BUN: 2 mg/dL — ABNORMAL LOW (ref 6–23)
Chloride: 105 mEq/L (ref 96–112)
GFR calc non Af Amer: >60 mL/min (ref >60)
Glucose, Bld: 103 mg/dL — ABNORMAL HIGH (ref 70–99)
Potassium: 3.2 mEq/L — ABNORMAL LOW (ref 3.5–5.1)

## 2010-07-07 LAB — CBC
HCT: 32.2 % — ABNORMAL LOW (ref 36.0–46.0)
MCV: 86.8 fL (ref 78.0–100.0)
Platelets: 310 10*3/uL (ref 150–400)
RDW: 13 % (ref 11.5–15.5)

## 2010-07-07 LAB — POCT CARDIAC MARKERS

## 2010-07-07 LAB — DIFFERENTIAL
Basophils Absolute: 0.1 10*3/uL (ref 0.0–0.1)
Lymphs Abs: 1.9 10*3/uL (ref 0.7–4.0)
Monocytes Absolute: 0.3 10*3/uL (ref 0.1–1.0)

## 2010-07-08 ENCOUNTER — Emergency Department (HOSPITAL_COMMUNITY)
Admission: EM | Admit: 2010-07-08 | Discharge: 2010-07-08 | Disposition: A | Discharge status: Home / Self Care | Payer: Medicaid Other | Attending: Emergency Medicine | Admitting: Emergency Medicine

## 2010-07-08 ENCOUNTER — Emergency Department (HOSPITAL_COMMUNITY): Payer: Medicaid Other

## 2010-07-08 DIAGNOSIS — K219 Gastro-esophageal reflux disease without esophagitis: Secondary | ICD-10-CM | POA: Insufficient documentation

## 2010-07-08 DIAGNOSIS — I10 Essential (primary) hypertension: Secondary | ICD-10-CM | POA: Insufficient documentation

## 2010-07-08 DIAGNOSIS — R079 Chest pain, unspecified: Secondary | ICD-10-CM | POA: Insufficient documentation

## 2010-07-08 DIAGNOSIS — R1013 Epigastric pain: Secondary | ICD-10-CM | POA: Insufficient documentation

## 2010-07-08 LAB — CBC
MCH: 28.1 pg (ref 26.0–34.0)
MCV: 82.5 fL (ref 78.0–100.0)
Platelets: 329 10*3/uL (ref 150–400)
RDW: 13 % (ref 11.5–15.5)

## 2010-07-08 LAB — BASIC METABOLIC PANEL
BUN: 7 mg/dL (ref 6–23)
Chloride: 104 mEq/L (ref 96–112)
Glucose, Bld: 107 mg/dL — ABNORMAL HIGH (ref 70–99)
Potassium: 3.3 mEq/L — ABNORMAL LOW (ref 3.5–5.1)

## 2010-07-08 LAB — POCT CARDIAC MARKERS: Troponin i, poc: <0.05 ng/mL (ref 0.00–0.09)

## 2010-07-08 LAB — DIFFERENTIAL
Eosinophils Absolute: 0.1 10*3/uL (ref 0.0–0.7)
Eosinophils Relative: 2 % (ref 0–5)
Lymphs Abs: 2.6 10*3/uL (ref 0.7–4.0)
Monocytes Relative: 6 % (ref 3–12)

## 2010-07-08 IMAGING — CR DG CHEST 1V PORT
1 series · 1 of 1 positions shown · non-contrast
Comparison: Chest radiograph performed [DATE]

CLINICAL DATA: Chest pain and mid back pain; history of
hypertension and smoking.

PORTABLE CHEST - 1 VIEW

[view not recorded]
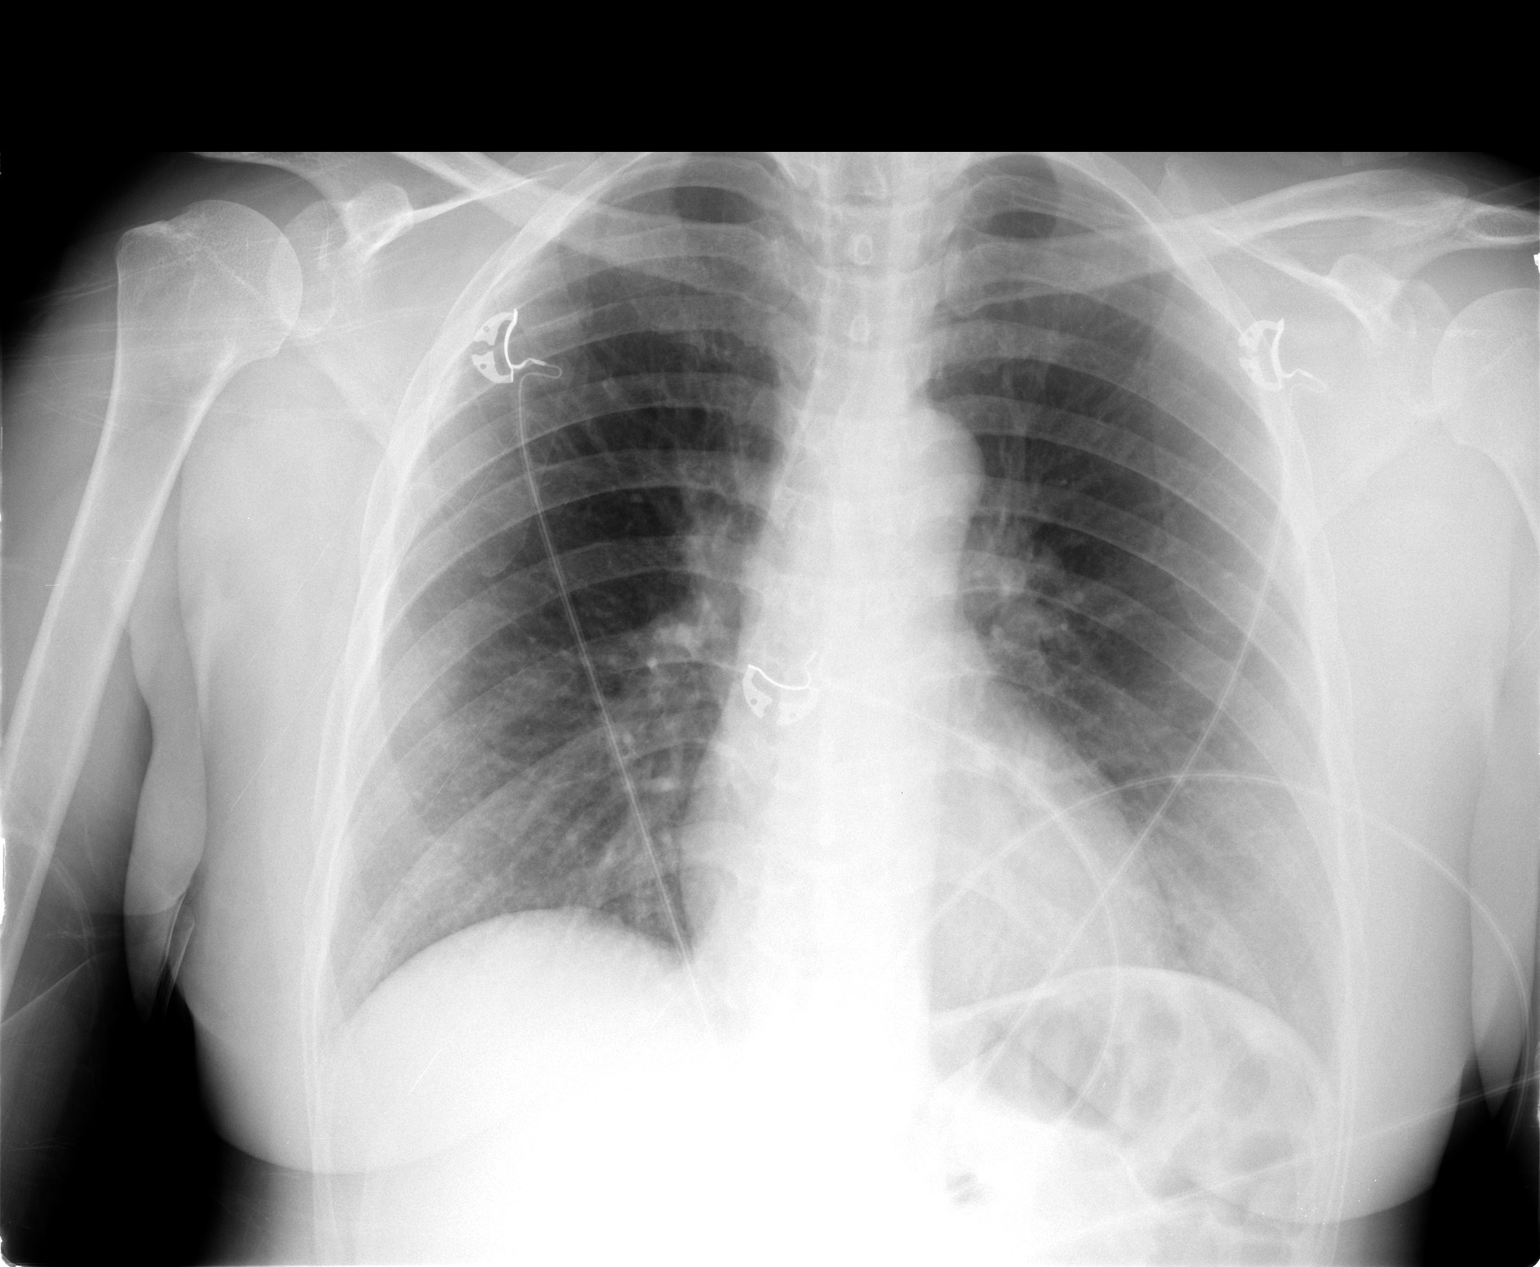

[1 of 1 positions shown; findings below may reference images not displayed]

FINDINGS: The lungs are well-aerated and clear.  There is no
evidence of focal opacification, pleural effusion or pneumothorax.

The cardiomediastinal silhouette is within normal limits.  No acute
osseous abnormalities are seen.
IMPRESSION: No acute cardiopulmonary process seen.

## 2010-07-21 ENCOUNTER — Other Ambulatory Visit (HOSPITAL_COMMUNITY): Payer: Self-pay | Admitting: Family Medicine

## 2010-07-21 DIAGNOSIS — Z1239 Encounter for other screening for malignant neoplasm of breast: Secondary | ICD-10-CM

## 2010-07-24 ENCOUNTER — Emergency Department (HOSPITAL_COMMUNITY)
Admission: EM | Admit: 2010-07-24 | Discharge: 2010-07-24 | Disposition: A | Discharge status: Home / Self Care | Payer: Medicaid Other | Attending: Emergency Medicine | Admitting: Emergency Medicine

## 2010-07-24 DIAGNOSIS — I10 Essential (primary) hypertension: Secondary | ICD-10-CM | POA: Insufficient documentation

## 2010-07-24 DIAGNOSIS — N764 Abscess of vulva: Secondary | ICD-10-CM | POA: Insufficient documentation

## 2010-08-15 NOTE — Op Note (Signed)
Amber Drake, Amber Drake NO.:  [id number]   MEDICAL RECORD NO.:  [id number]          PATIENT TYPE:  OBV   LOCATION:  A310                          FACILITY:  [facility]   PHYSICIAN:  Barbaraann Barthel, M.D. 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  2. Bilateral perianal and buttock abscesses.  3. An abscess on the right axilla.   PROCEDURES:  1. Rigid proctoscopy.  2. Excision of left posterior fistula in ano.  3. Drainage of bilateral buttock abscesses.  4. Incision and drainage of abscess of right axilla.   SPECIMEN:  Fistulous tract and cultures of perianal fistula abscesses  and right axilla abscess.   NOTE:  This is 50 year old black female who has had over a year's worth  of perianal and buttock discomfort.  She has had multiple abscesses down  in this area.  These have been treated with antibiotics and I&D  procedures in the emergency room.  She presented to my office with this  long history and it was obvious that she had a chronic subacute  infection area and we would plan to do this in the operating room.  She  was placed on antibiotics and we discussed the need to take care of this  fistula in ano and that this was an extensive procedure that would  require debridement and drainage and packing the wound open.  We  discussed in detail.  We discussed complications not limited to but  including bleeding, infection and the possibility of recurrence.  Informed consent was obtained.   GROSS OPERATIVE FINDINGS:  The patient had a fistula in ano that opened  in the midline from her left perianal area that affected chronic  abscesses that formed in her left buttock.  She also had other abscess  on her right buttock as well.  The fistula in ano with the fistulous  tract into the midline of her anal area was on the left side.  Rigid  proctoscopy  was done to 25 cm.  There was an area where there was an  internal fistulous tract noted in the midline.  There was also an  abscess on her right axilla.   TECHNIQUE:  The patient was placed in the lithotomy position and after  digital examination and rigid proctoscopy, I cannulated the external  opening and opened this over the cannula towards the midline that was  involved with abscesses in her left perianal and buttock area.  This was  quite extensive.  We unroofed quite a number of fistulous tracts and  abscesses in this area.  The wound was then irrigated and packed open.  An abscess was also drained and debrided with chronic areas of abscesses  on the right buttock as well.  These were all irrigated and packed open.  The bleeding was controlled with a cautery device and the wound was  packed open with 2-inch roll gauze that was soaked in saline and ABD  pads and the pantyhose Surgiflex  type of dressing was applied.  We then  changed gloves and attention was then turned to the right axillary  abscess.  This was a small abscess which was I&D'd and cultures were  obtained and again this was irrigated and packed open and a sterile  dressing was applied.  Prior to closure all sponge, needle and  instrument counts were found to be correct.  Estimated blood loss is  less than 100 mL.  The  patient received at least a liter of crystalloids intraoperatively, and  there were no complications.  The physical therapy department will be  coordinating outpatient wound care with this patient and family.  They  will begin dressing changes on March 06, 2007.      Barbaraann Barthel, M.D.  Electronically Signed     WB/MEDQ  D:  03/05/2007  T:  03/05/2007  Job:  [id number]   cc:   Physical Therapy Department

## 2010-08-18 NOTE — Consult Note (Signed)
[name], [name] NO.:  [id number]   MEDICAL RECORD NO.:  [id number]          PATIENT TYPE:  EMS   LOCATION:  ED                            FACILITY:  [facility]   PHYSICIAN:  Barbaraann Barthel, M.D. DATE OF BIRTH:  March 08, 1961   DATE OF CONSULTATION:  DATE OF DISCHARGE:                                   CONSULTATION   Note:  Surgery was asked to see this 50 year old black female for perianal  discomfort.  In essence, she had a previous I&D procedure in her right  perianal area, and she had some tenderness that was still present there;  however, she had no erythema, no drainage and no obvious abscess clinically  in this area.  She has no fluctuance and no crepitance and essentially no  obvious abscess present, and she is a non-diabetic.   IMPRESSION:  She has some residual tenderness from her previous I&D  procedure with no obvious abscess present.  She has been given parenteral  Cipro, and I have given her a prescription for Cipro and Flagyl, in case  there is a small inflammatory process that is beginning; however, this is  not obvious to me.  I have told her to practice good hygeine after having a  bowel movement, and I will follow her in my office, and she is told to come  to the emergency room if there are any acute changes.      Barbaraann Barthel, M.D.  Electronically Signed     WB/MEDQ  D:  11/23/2005  T:  11/23/2005  Job:  [id number]   cc:   Barbaraann Barthel, M.D.  Fax: [phone]

## 2010-08-18 NOTE — Op Note (Signed)
Amber Drake, Amber Drake                          ACCOUNT NO.:  [id number]   MEDICAL RECORD NO.:  [id number]                   PATIENT TYPE:  AMB   LOCATION:  DAY                                  FACILITY:  [facility]   PHYSICIAN:  R. Roetta Sessions, M.D.              DATE OF BIRTH:  10-Apr-1960   DATE OF PROCEDURE:  DATE OF DISCHARGE:                                 OPERATIVE REPORT   PROCEDURE:  Screening colonoscopy.   ENDOSCOPIST:  Gerrit Friends. Rourk, M.D.   INDICATIONS FOR PROCEDURE:  The patient is a 50 year old lady who complains  of intermittent, low volume painless rectal bleeding.  She has 1-2 bowel  movements daily.  She denies constipation or diarrhea.  She has not had any  melena or abdominal pain.  No change in weight and no upper GI tract  symptoms.  No family history of colorectal neoplasia.  She has never had her  colon imaged previously.  Colonoscopy is now being done.  She was referred  by  Dr. Greta Doom in the ED at [facility].  Please see my  handwritten H&P.   PROCEDURE NOTE:  O2 saturation, blood pressure, pulse and respirations were  monitored throughout the entire procedure.  Conscious sedation: Versed 3 mg IV, Demerol 75 mg IV in divided doses.   INSTRUMENT:  Olympus video chip system.   FINDINGS:  A digital exam revealed no abnormalities.   ENDOSCOPIC FINDINGS:  The prep was good.   RECTUM:  An on-face view of the anal canal revealed only anal canal  hemorrhoids.   COLON:  The colonic mucosa was surveyed from the rectosigmoid junction  through the left transverse and right colon to the area of the appendiceal  orifice, ileocecal valve, and cecum.  These structures were well seen and  photographed for the record.   From this level the scope was slowly withdrawn.  All previously mentioned  mucosal surfaces were again seen.  The colonic mucosa appeared normal.  The  patient tolerated the procedure well and was reacted in endoscopy.   IMPRESSION:  1.  Anal canal hemorrhoids, otherwise normal. rectum.  2. Normal colon.   I suspect that the patient has had low volume bleeding from hemorrhoids.   RECOMMENDATIONS:  1. Hemorrhoid literature provided to the patient.  2  A 10-day course of Anusol suppositories, 1 per rectum at bedtime.  1. Follow up with Dr. Katrinka Blazing and associates, her primary care physician.      ___________________________________________                                            Jonathon Bellows, M.D.   RMR/MEDQ  D:  09/10/2003  T:  09/10/2003  Job:  [id number]   cc:   Dirk Dress. Katrinka Blazing, M.D.  [address]  [place]  [place] [address]  Fax: [phone]   Nicoletta Dress. Colon Branch, M.D.  [address] [place]  [place] [address]  Fax: [phone]   R. Roetta Sessions, M.D.  [address]  [place]  [place] [address]  Fax: [phone]

## 2010-10-09 ENCOUNTER — Other Ambulatory Visit (HOSPITAL_COMMUNITY): Payer: Medicaid Other

## 2010-10-10 ENCOUNTER — Other Ambulatory Visit (HOSPITAL_COMMUNITY): Payer: Self-pay

## 2010-10-12 ENCOUNTER — Encounter (HOSPITAL_COMMUNITY): Payer: Self-pay

## 2010-10-12 ENCOUNTER — Encounter (HOSPITAL_COMMUNITY)
Admission: RE | Admit: 2010-10-12 | Discharge: 2010-10-12 | Disposition: A | Discharge status: Home / Self Care | Payer: Medicaid Other | Source: Ambulatory Visit | Attending: General Surgery | Admitting: General Surgery

## 2010-10-12 HISTORY — DX: Disease of blood and blood-forming organs, unspecified: D75.9

## 2010-10-12 HISTORY — DX: Sickle-cell trait: D57.3

## 2010-10-12 HISTORY — DX: Essential (primary) hypertension: I10

## 2010-10-12 LAB — DIFFERENTIAL
Basophils Absolute: 0 10*3/uL (ref 0.0–0.1)
Eosinophils Relative: 1 % (ref 0–5)
Lymphocytes Relative: 37 % (ref 12–46)
Lymphs Abs: 1.8 10*3/uL (ref 0.7–4.0)
Monocytes Absolute: 0.3 10*3/uL (ref 0.1–1.0)
Monocytes Relative: 6 % (ref 3–12)

## 2010-10-12 LAB — CBC
HCT: 34.3 % — ABNORMAL LOW (ref 36.0–46.0)
Hemoglobin: 11.9 g/dL — ABNORMAL LOW (ref 12.0–15.0)
MCV: 83.1 fL (ref 78.0–100.0)
RDW: 13.3 % (ref 11.5–15.5)
WBC: 5 10*3/uL (ref 4.0–10.5)

## 2010-10-12 LAB — BASIC METABOLIC PANEL
BUN: 8 mg/dL (ref 6–23)
CO2: 27 mEq/L (ref 19–32)
Calcium: 9.8 mg/dL (ref 8.4–10.5)
Creatinine, Ser: 0.58 mg/dL (ref 0.50–1.10)
Glucose, Bld: 89 mg/dL (ref 70–99)

## 2010-10-12 LAB — SURGICAL PCR SCREEN
MRSA, PCR: NEGATIVE
Staphylococcus aureus: NEGATIVE

## 2010-10-12 LAB — HCG, SERUM, QUALITATIVE: Preg, Serum: NEGATIVE

## 2010-10-12 NOTE — Patient Instructions (Addendum)
20 Amber Drake  10/12/2010   Your procedure is scheduled on:  10/17/2010  Report to [facility] at 0830 AM.  Call this number if you have problems the morning of surgery: [phone]   Remember:   Do not eat food:After Midnight.  Do not drink clear liquids: After Midnight.  Take these medicines the morning of surgery with A SIP OF WATER: none   Do not wear jewelry, make-up or nail polish.  Do not bring valuables to the hospital.  Contacts, dentures or bridgework may not be worn into surgery.  Leave suitcase in the car. After surgery it may be brought to your room.  For patients admitted to the hospital, checkout time is 11:00 AM the day of discharge.   Patients discharged the day of surgery will not be allowed to drive home.  Name and phone number of your driver: daughter or sister  Special Instructions: CHG Shower Shower 2 days before surgery and 1 day before surgery with Hibiclens.   Please read over the following fact sheets that you were given: Pain Booklet, MRSA Information, Surgical Site Infection Prevention and Anesthesia Post-op Instructions    PATIENT INSTRUCTIONS POST-ANESTHESIA  IMMEDIATELY FOLLOWING SURGERY:  Do not drive or operate machinery for the first twenty four hours after surgery.  Do not make any important decisions for twenty four hours after surgery or while taking narcotic pain medications or sedatives.  If you develop intractable nausea and vomiting or a severe headache please notify your doctor immediately.  FOLLOW-UP:  Please make an appointment with your surgeon as instructed. You do not need to follow up with anesthesia unless specifically instructed to do so.  WOUND CARE INSTRUCTIONS (if applicable):  Keep a dry clean dressing on the anesthesia/puncture wound site if there is drainage.  Once the wound has quit draining you may leave it open to air.  Generally you should leave the bandage intact for twenty four hours unless there is drainage.  If the  epidural site drains for more than 36-48 hours please call the anesthesia department.  QUESTIONS?:  Please feel free to call your physician or the hospital operator if you have any questions, and they will be happy to assist you.     [facility] Anesthesia Department [address] [place] [phone]    PATIENT INSTRUCTIONS POST-ANESTHESIA  IMMEDIATELY FOLLOWING SURGERY:  Do not drive or operate machinery for the first twenty four hours after surgery.  Do not make any important decisions for twenty four hours after surgery or while taking narcotic pain medications or sedatives.  If you develop intractable nausea and vomiting or a severe headache please notify your doctor immediately.  FOLLOW-UP:  Please make an appointment with your surgeon as instructed. You do not need to follow up with anesthesia unless specifically instructed to do so.  WOUND CARE INSTRUCTIONS (if applicable):  Keep a dry clean dressing on the anesthesia/puncture wound site if there is drainage.  Once the wound has quit draining you may leave it open to air.  Generally you should leave the bandage intact for twenty four hours unless there is drainage.  If the epidural site drains for more than 36-48 hours please call the anesthesia department.  QUESTIONS?:  Please feel free to call your physician or the hospital operator if you have any questions, and they will be happy to assist you.     [facility] Anesthesia Department [address] [place] [phone]

## 2010-10-13 ENCOUNTER — Ambulatory Visit (HOSPITAL_COMMUNITY): Admission: RE | Admit: 2010-10-13 | Payer: Medicaid Other | Source: Ambulatory Visit | Admitting: General Surgery

## 2010-10-13 ENCOUNTER — Encounter (HOSPITAL_COMMUNITY): Admission: RE | Payer: Self-pay | Source: Ambulatory Visit

## 2010-10-13 SURGERY — MINOR INCISION AND DRAINAGE OF ABSCESS
Anesthesia: Choice

## 2010-10-17 ENCOUNTER — Other Ambulatory Visit: Payer: Self-pay | Admitting: General Surgery

## 2010-10-17 ENCOUNTER — Ambulatory Visit (HOSPITAL_COMMUNITY)
Admission: RE | Admit: 2010-10-17 | Discharge: 2010-10-17 | Disposition: A | Discharge status: Home / Self Care | Payer: Medicaid Other | Source: Ambulatory Visit | Attending: General Surgery | Admitting: General Surgery

## 2010-10-17 ENCOUNTER — Ambulatory Visit (HOSPITAL_COMMUNITY): Payer: Medicaid Other | Admitting: Anesthesiology

## 2010-10-17 ENCOUNTER — Encounter (HOSPITAL_COMMUNITY): Payer: Self-pay | Admitting: Anesthesiology

## 2010-10-17 ENCOUNTER — Encounter (HOSPITAL_COMMUNITY)
Admission: RE | Disposition: A | Discharge status: Home / Self Care | Payer: Self-pay | Source: Ambulatory Visit | Attending: General Surgery

## 2010-10-17 DIAGNOSIS — J40 Bronchitis, not specified as acute or chronic: Secondary | ICD-10-CM | POA: Diagnosis present

## 2010-10-17 DIAGNOSIS — K612 Anorectal abscess: Secondary | ICD-10-CM | POA: Insufficient documentation

## 2010-10-17 DIAGNOSIS — D573 Sickle-cell trait: Secondary | ICD-10-CM | POA: Insufficient documentation

## 2010-10-17 DIAGNOSIS — Z01812 Encounter for preprocedural laboratory examination: Secondary | ICD-10-CM | POA: Insufficient documentation

## 2010-10-17 DIAGNOSIS — I10 Essential (primary) hypertension: Secondary | ICD-10-CM | POA: Insufficient documentation

## 2010-10-17 DIAGNOSIS — Z720 Tobacco use: Secondary | ICD-10-CM | POA: Diagnosis present

## 2010-10-17 DIAGNOSIS — N9089 Other specified noninflammatory disorders of vulva and perineum: Secondary | ICD-10-CM | POA: Insufficient documentation

## 2010-10-17 HISTORY — PX: PROCTOSCOPY: SHX2266

## 2010-10-17 SURGERY — INCISION AND DRAINAGE, ABSCESS
Anesthesia: Spinal | Site: Rectum | Wound class: Dirty or Infected

## 2010-10-17 MED ORDER — LIDOCAINE IN DEXTROSE 5-7.5 % IV SOLN
INTRAVENOUS | Status: AC
  Filled 2010-10-17: qty 2

## 2010-10-17 MED ORDER — FENTANYL CITRATE 0.05 MG/ML IJ SOLN
INTRAMUSCULAR | Status: AC
  Filled 2010-10-17: qty 2

## 2010-10-17 MED ORDER — MIDAZOLAM HCL 2 MG/2ML IJ SOLN
INTRAMUSCULAR | Status: AC
  Filled 2010-10-17: qty 2

## 2010-10-17 MED ORDER — LIDOCAINE VISCOUS 2 % MT SOLN
OROMUCOSAL | Status: AC
  Filled 2010-10-17: qty 20

## 2010-10-17 MED ORDER — DIPHENHYDRAMINE HCL 50 MG/ML IJ SOLN
INTRAMUSCULAR | Status: DC | PRN
  Administered 2010-10-17: 25 mg via INTRAVENOUS

## 2010-10-17 MED ORDER — BUPIVACAINE HCL (PF) 0.5 % IJ SOLN
INTRAMUSCULAR | Status: AC
  Filled 2010-10-17: qty 30

## 2010-10-17 MED ORDER — CIPROFLOXACIN IN D5W 400 MG/200ML IV SOLN
INTRAVENOUS | Status: AC
  Filled 2010-10-17: qty 200

## 2010-10-17 MED ORDER — FENTANYL CITRATE 0.05 MG/ML IJ SOLN
INTRAMUSCULAR | Status: DC | PRN
  Administered 2010-10-17: 25 ug via INTRATHECAL

## 2010-10-17 MED ORDER — FENTANYL CITRATE 0.05 MG/ML IJ SOLN
INTRAMUSCULAR | Status: DC | PRN
  Administered 2010-10-17: 25 ug via INTRAVENOUS
  Administered 2010-10-17: 50 ug via INTRAVENOUS

## 2010-10-17 MED ORDER — LACTATED RINGERS IV SOLN
INTRAVENOUS | Status: DC
  Administered 2010-10-17 (×2): via INTRAVENOUS

## 2010-10-17 MED ORDER — DIPHENHYDRAMINE HCL 50 MG/ML IJ SOLN
INTRAMUSCULAR | Status: AC
  Filled 2010-10-17: qty 1

## 2010-10-17 MED ORDER — LIDOCAINE HCL (PF) 0.5 % IJ SOLN
INTRAMUSCULAR | Status: DC | PRN
  Administered 2010-10-17: 75 mg via INTRATHECAL

## 2010-10-17 MED ORDER — PROPOFOL 10 MG/ML IV EMUL
INTRAVENOUS | Status: AC
  Filled 2010-10-17: qty 20

## 2010-10-17 MED ORDER — CIPROFLOXACIN IN D5W 400 MG/200ML IV SOLN
400.0000 mg | INTRAVENOUS | Status: AC
  Administered 2010-10-17: 400 mg via INTRAVENOUS

## 2010-10-17 MED ORDER — PROPOFOL 10 MG/ML IV EMUL
INTRAVENOUS | Status: DC | PRN
  Administered 2010-10-17: 25 ug/kg/min via INTRAVENOUS

## 2010-10-17 MED ORDER — MIDAZOLAM HCL 2 MG/2ML IJ SOLN
1.0000 mg | INTRAMUSCULAR | Status: DC | PRN
  Administered 2010-10-17: 2 mg via INTRAVENOUS

## 2010-10-17 MED ORDER — MIDAZOLAM HCL 5 MG/5ML IJ SOLN
INTRAMUSCULAR | Status: DC | PRN
  Administered 2010-10-17: 2 mg via INTRAVENOUS

## 2010-10-17 MED ORDER — LIDOCAINE HCL (PF) 1 % IJ SOLN
INTRAMUSCULAR | Status: AC
  Filled 2010-10-17: qty 5

## 2010-10-17 SURGICAL SUPPLY — 35 items
APL FBRTP 16 NS LF PRCTSCP (MISCELLANEOUS) ×4
BAG HAMPER (MISCELLANEOUS) ×3 IMPLANT
BANDAGE CONFORM 2  STR LF (GAUZE/BANDAGES/DRESSINGS) ×3 IMPLANT
CLOTH BEACON ORANGE TIMEOUT ST (SAFETY) ×3 IMPLANT
COVER LIGHT HANDLE STERIS (MISCELLANEOUS) ×6 IMPLANT
DRAPE PROXIMA HALF (DRAPES) ×1 IMPLANT
ELECT REM PT RETURN 9FT ADLT (ELECTROSURGICAL) ×3
ELECTRODE REM PT RTRN 9FT ADLT (ELECTROSURGICAL) ×2 IMPLANT
FORMALIN 10 PREFIL 120ML (MISCELLANEOUS) ×3 IMPLANT
GLOVE SKINSENSE NS SZ7.0 (GLOVE) ×1
GLOVE SKINSENSE STRL SZ7.0 (GLOVE) ×2 IMPLANT
GLOVE SS BIOGEL STRL SZ 6.5 (GLOVE) IMPLANT
GLOVE SUPERSENSE BIOGEL SZ 6.5 (GLOVE) ×2
GOWN BRE IMP SLV AUR XL STRL (GOWN DISPOSABLE) ×7 IMPLANT
KIT ROOM TURNOVER APOR (KITS) ×3 IMPLANT
MANIFOLD NEPTUNE II (INSTRUMENTS) ×3 IMPLANT
MARKER SKIN DUAL TIP RULER LAB (MISCELLANEOUS) ×3 IMPLANT
NDL HYPO 25X5/8 SAFETYGLIDE (NEEDLE) ×2 IMPLANT
NEEDLE HYPO 25X5/8 SAFETYGLIDE (NEEDLE) ×3 IMPLANT
NS IRRIG 1000ML POUR BTL (IV SOLUTION) ×3 IMPLANT
PACK BASIC LIMB (CUSTOM PROCEDURE TRAY) IMPLANT
PACK MINOR (CUSTOM PROCEDURE TRAY) ×3 IMPLANT
PAD ABD 5X9 TENDERSORB (GAUZE/BANDAGES/DRESSINGS) ×3 IMPLANT
PAD ARMBOARD 7.5X6 YLW CONV (MISCELLANEOUS) ×3 IMPLANT
SET BASIN LINEN APH (SET/KITS/TRAYS/PACK) ×3 IMPLANT
SHEET LAVH (DRAPES) ×1 IMPLANT
SIGMOIDOSCOPE DISPOSABLE (MISCELLANEOUS) ×3 IMPLANT
SPONGE GAUZE 4X4 12PLY (GAUZE/BANDAGES/DRESSINGS) ×3 IMPLANT
SUCT SIGMOIDOSCOPE TIP 18 W/TU (SUCTIONS) ×3 IMPLANT
SUT CHROMIC 2 0 SH (SUTURE) ×6 IMPLANT
SWAB CULTURE LIQ STUART DBL (MISCELLANEOUS) ×1 IMPLANT
SWAB PROCTOSCOPIC (MISCELLANEOUS) ×6 IMPLANT
SYR BULB IRRIGATION 50ML (SYRINGE) ×3 IMPLANT
TOWEL OR 17X26 4PK STRL BLUE (TOWEL DISPOSABLE) ×3 IMPLANT
WATER STERILE IRR 1000ML POUR (IV SOLUTION) ×6 IMPLANT

## 2010-10-17 NOTE — Op Note (Signed)
NAMESHARRI, LOYA NO.:  [id number]  MEDICAL RECORD NO.:  [id number]  LOCATION:  APPO                          FACILITY:  [facility]  PHYSICIAN:  Barbaraann Barthel, M.D. DATE OF BIRTH:  September 11, 1960  DATE OF PROCEDURE:  10/17/2010 DATE OF DISCHARGE:                              OPERATIVE REPORT   PREOPERATIVE DIAGNOSIS:  Chronic perianal abscesses.  POSTOPERATIVE DIAGNOSIS:  Chronic perianal abscesses.  PROCEDURE: 1. Examination under anesthesia. 2. Rigid proctoscopy to 20 cm. 3. Excision of perianal and (left labia majora cyst).  SPECIMEN:  Cystic type lesion left labia majora.  Wound classification, infected.  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  She was seen in my office and it was difficult to examine her, at that time she had a draining area near her perianal area and also near her vagina. This was actually located near the labia majora, this was draining at that time, she was placed on antibiotics and we had planned for an examination under anesthesia and with excision and drainage and possible treatment of perianal abscess or fistula in anal.  We discussed preoperatively complications not limited to but including bleeding, infection, and the possibility of recurrence in this patient. Informed consent was obtained.  SPECIMENS:  Taken the patient had the excision of an indurated area which was no longer draining, but was previously draining and this was possibly a cyst.  We also had cultures aerobic cultures taken of this area.  TECHNIQUE:  The patient was placed in a lithotomy position after the adequate administration of spinal anesthesia.  A vaginal exam was carried out with speculum as well as a rectal examination with rigid proctoscopy.  After placing her in the lithotomy position and after a speculum examination which did not reveal any fistulous  tract.  We also did a rectal examination which did not reveal any internal fistula opening and normal rectal mucosa to 20 cm.  I then excised elliptically around the area that was indurated and had previously been draining thinking that this was probably a chronic inflamed cyst.  This was done.  Cultures were obtained.  The wound was packed open with 2 x 2 soaked in saline solution and a sterile dressing was applied.  Prior to closure all sponge, needle instrument counts found to be correct. Estimated blood loss was minimal.  The patient received 500 mL of crystalloids intraoperatively.  No drains were placed and there were no complications.     Barbaraann Barthel, M.D.     WB/MEDQ  D:  10/17/2010  T:  10/17/2010  Job:  [id number]

## 2010-10-17 NOTE — Progress Notes (Signed)
Documented for anesthesia

## 2010-10-17 NOTE — Anesthesia Postprocedure Evaluation (Signed)
  Anesthesia Post-op Note  Patient: AMBERA FEDELE  Procedure(s) Performed:  INCISION AND DRAINAGE ABSCESS - Incision & Drainage of Perianal Abscess  procedure began at 1009; PROCTOSCOPY - Rigid Proctoscopy/Possible Fistula in Ano  Procedure ended at 1003  Patient Location: PACU  Anesthesia Type: Spinal  Level of Consciousness: alert   Airway and Oxygen Therapy: Patient Spontanous Breathing  Post-op Pain: none  Post-op Assessment: Patient's Cardiovascular Status Stable, Respiratory Function Stable, Patent Airway, No signs of Nausea or vomiting and Pain level controlled  Post-op Vital Signs: stable  Complications: No apparent anesthesia complications

## 2010-10-17 NOTE — Brief Op Note (Addendum)
10/17/2010  10:21 AM  PATIENT:  Amber Drake  50 y.o. female  PRE-OPERATIVE DIAGNOSIS:  perianal abscess  POST-OPERATIVE DIAGNOSIS:  perianal abscess  PROCEDURE:  Procedure(s): INCISION AND DRAINAGE ABSCESS PROCTOSCOPY vaginal exam under anesthesia  SURGEON:  Surgeon(s): Marlane Hatcher  PHYSICIAN ASSISTANT:   ASSISTANTS: none   ANESTHESIA:   spinal  ESTIMATED BLOOD LOSS: * No blood loss amount entered *   BLOOD ADMINISTERED:none  DRAINS: none   LOCAL MEDICATIONS USED:  NONE  SPECIMEN:  Excision  DISPOSITION OF SPECIMEN:  PATHOLOGY, cultures taken of left labia majorum  COUNTS:  YES  TOURNIQUET:  * No tourniquets in log *  DICTATION #: [id number]  PLAN OF CARE: F/U in my office 10:00 AM  10/18/10  PATIENT DISPOSITION:  discharge from short stay   Delay start of Pharmacological VTE agent (>24hrs) due to surgical blood loss or risk of bleeding:  not applicable

## 2010-10-17 NOTE — Transfer of Care (Signed)
Immediate Anesthesia Transfer of Care Note  Patient: Amber Drake  Procedure(s) Performed:  INCISION AND DRAINAGE ABSCESS - Incision & Drainage of Perianal Abscess  procedure began at 1009; PROCTOSCOPY - Rigid Proctoscopy/Possible Fistula in Ano  Procedure ended at 1003  Patient Location: PACU  Anesthesia Type: Spinal  Level of Consciousness: awake and oriented  Airway & Oxygen Therapy: Patient Spontanous Breathing and Patient connected to face mask oxygen  Post-op Assessment: Report given to PACU RN, Post -op Vital signs reviewed and stable and Patient moving all extremities  Post vital signs: Reviewed and stable  Complications: No apparent anesthesia complications

## 2010-10-17 NOTE — Anesthesia Procedure Notes (Addendum)
Spinal Block  Patient location during procedure: OR Start time: 10/17/2010 9:36 AM Staffing CRNA/Resident: Marylene Buerger Performed by: resident/CRNA  Spinal Block Patient position: sitting  Spinal Block  Staffing CRNA/Resident: Marylene Buerger Performed by: resident/CRNA  Spinal Block Patient position: sitting Prep: Betadine Patient monitoring: heart rate, cardiac monitor, continuous pulse ox and blood pressure Approach: midline Location: L4-5 Injection technique: single-shot Needle Needle type: Sprotte  Needle gauge: 24 G Needle length: 10 cm  Spinal Block  Additional Notes [id number] 2012  -  10 Lidocaine 70 Mg. Fentanyl  25 Mcg.

## 2010-10-17 NOTE — H&P (Signed)
Amber Drake is an 50 y.o. female.   Chief Complaint: perianal abscess HPI: chronic hx of perianal abscess  Past Medical History  Diagnosis Date  . Sickle cell trait   . Hypertension     does not take meds  . Blood dyscrasia     sickle cell trait    Past Surgical History  Procedure Date  . Appendectomy   . Incision and drainage perirectal abscess 12/21/09  . Cesarean section     x 2    Family History  Problem Relation Age of Onset  . Kidney failure Daughter   . Sickle cell anemia Son    Social History:  reports that she has been smoking Cigarettes.  She has a 7.5 pack-year smoking history. She does not have any smokeless tobacco history on file. She reports that she does not drink alcohol or use illicit drugs.  Allergies:  Allergies  Allergen Reactions  . Penicillins Hives  . Tape Itching    Medications Prior to Admission  Medication Dose Route Frequency Provider Last Rate Last Dose  . ciprofloxacin (CIPRO) IVPB 400 mg  400 mg Intravenous 120 min pre-op Marlane Hatcher      . lactated ringers infusion   Intravenous Continuous Laurene Footman 20 mL/hr at 10/17/10 1478     Medications Prior to Admission  Medication Sig Dispense Refill  . doxepin (SINEQUAN) 100 MG capsule Take 100 mg by mouth 2 (two) times daily. Take for 10 days      . traMADol (ULTRAM-ER) 100 MG 24 hr tablet Take 100 mg by mouth daily.          No results found for this or any previous visit (from the past 48 hour(s)). No results found.  Review of Systems  Constitutional: Negative.   HENT: Negative.   Eyes: Negative.   Respiratory: Positive for cough.   Cardiovascular: Negative.   Gastrointestinal: Negative.   Genitourinary: Negative.   Musculoskeletal: Negative.   Skin: Positive for itching.  Neurological: Negative.   Endo/Heme/Allergies: Negative.   Psychiatric/Behavioral: Negative.     Blood pressure 163/86, pulse 65, temperature 98.3 F (36.8 C), temperature source Oral, resp.  rate 13, last menstrual period 07/18/2010, SpO2 96.00%. Physical Exam  Respiratory: Breath sounds normal.  Musculoskeletal: Normal range of motion.     Assessment/Plan I and D of perianal abscess  Benzion Mesta S 10/17/2010, 8:47 AM

## 2010-10-17 NOTE — Anesthesia Preprocedure Evaluation (Addendum)
Anesthesia Evaluation  Name, MR# and DOB Patient awake  General Assessment Comment  Reviewed: Allergy & Precautions, H&P  and Patient's Chart, lab work & pertinent test results  History of Anesthesia Complications Negative for: history of anesthetic complications  Airway Mallampati: II TM Distance: >3 FB Neck ROM: Full    Dental  (+) Edentulous Upper   Pulmonary    pulmonary exam normal   Cardiovascular hypertension, Regular Normal   Neuro/Psych  GI/Hepatic/Renal   Endo/Other   Abdominal   Musculoskeletal  Hematology  (+) Blood dyscrasia, Sickle cell trait, ,   Peds  Reproductive/Obstetrics   Anesthesia Other Findings             Anesthesia Physical Anesthesia Plan  ASA: II  Anesthesia Plan: Spinal   Post-op Pain Management:    Induction:   Airway Management Planned: Nasal Cannula  Additional Equipment:   Intra-op Plan:   Post-operative Plan:   Informed Consent: I have reviewed the patients History and Physical, chart, labs and discussed the procedure including the risks, benefits and alternatives for the proposed anesthesia with the patient or authorized representative who has indicated his/her understanding and acceptance.     Plan Discussed with: CRNA  Anesthesia Plan Comments: (Saddle block, propofol sedation)        Anesthesia Quick Evaluation

## 2010-10-20 LAB — CULTURE, ROUTINE-ABSCESS
Culture: NO GROWTH
Gram Stain: NONE SEEN

## 2010-10-27 ENCOUNTER — Encounter (HOSPITAL_COMMUNITY): Payer: Self-pay | Admitting: General Surgery

## 2010-10-28 ENCOUNTER — Encounter (HOSPITAL_COMMUNITY): Payer: Self-pay | Admitting: Emergency Medicine

## 2010-10-28 ENCOUNTER — Emergency Department (HOSPITAL_COMMUNITY)
Admission: EM | Admit: 2010-10-28 | Discharge: 2010-10-28 | Disposition: A | Discharge status: Home / Self Care | Payer: Medicaid Other | Attending: Emergency Medicine | Admitting: Emergency Medicine

## 2010-10-28 DIAGNOSIS — N764 Abscess of vulva: Secondary | ICD-10-CM | POA: Insufficient documentation

## 2010-10-28 DIAGNOSIS — K612 Anorectal abscess: Secondary | ICD-10-CM

## 2010-10-28 DIAGNOSIS — F17200 Nicotine dependence, unspecified, uncomplicated: Secondary | ICD-10-CM | POA: Insufficient documentation

## 2010-10-28 MED ORDER — DOXYCYCLINE HYCLATE 100 MG PO TABS
100.0000 mg | ORAL_TABLET | Freq: Once | ORAL | Status: AC
Start: 2010-10-28 — End: 2010-10-28
  Administered 2010-10-28: 100 mg via ORAL
  Filled 2010-10-28: qty 1

## 2010-10-28 MED ORDER — DOXYCYCLINE HYCLATE 100 MG PO CAPS: 100.0000 mg | ORAL_CAPSULE | Freq: Two times a day (BID) | ORAL | Status: AC

## 2010-10-28 MED ORDER — LIDOCAINE-EPINEPHRINE 2 %-1:100000 IJ SOLN
1.7000 mL | Freq: Once | INTRAMUSCULAR | Status: AC
  Administered 2010-10-28: 1.7 mL

## 2010-10-28 MED ORDER — OXYCODONE-ACETAMINOPHEN 5-325 MG PO TABS: 1.0000 | ORAL_TABLET | ORAL | Status: AC | PRN

## 2010-10-28 MED ORDER — LIDOCAINE-EPINEPHRINE (PF) 2 %-1:200000 IJ SOLN
INTRAMUSCULAR | Status: AC
  Administered 2010-10-28: 10:00:00
  Filled 2010-10-28: qty 20

## 2010-10-28 NOTE — ED Notes (Signed)
edp in with pt at this time  

## 2010-10-28 NOTE — ED Notes (Signed)
In with EDP to I&D abscess area. Pt tolerated well but with pain. Nad. Area cleaned and dressed. Advised of several warm/hot tub soaks daily

## 2010-10-28 NOTE — ED Provider Notes (Signed)
Scribed for Dr. Ethelda Chick, the patient was seen in room 9. This chart was scribed by Jannette Fogo. This patient's care was started at 09:56.    Chief Complaint  Patient presents with  . Recurrent Skin Infections   HPI Amber Drake is a 50 y.o. female who presents to the Emergency Department for left labia majora abscess. Patient has a history of chronic perianal abscesses. She had an excision of perianal and left labia majora cyst on 10/17/2010 by Dr. Barbaraann Barthel, M.D. Patient states since the abscesses have been healing but then they came back. She complains of pain and pus drainage from the left labia majora. She denies any fever or other symptoms. Patient does not have pain medication at home. Last meal was yesterday at 18:00PM but the patient drank some water today. History of frequent tobacco use. There are no other associated symptoms and no other alleviating or aggravating factors.    Past Medical History  Diagnosis Date  . Sickle cell trait   . Hypertension     does not take meds  . Blood dyscrasia     sickle cell trait  Perianal abscesses  Past Surgical History  Procedure Date  . Appendectomy   . Incision and drainage perirectal abscess 12/21/09  . Cesarean section     x 2  . Proctoscopy 10/17/2010    Procedure: PROCTOSCOPY;  Surgeon: Marlane Hatcher;  Location: [facility];  Service: General;  Laterality: N/A;  Rigid Proctoscopy/Possible Fistula in Ano  Procedure ended at 1003    MEDICATIONS:  Previous Medications   DOXEPIN (SINEQUAN) 100 MG CAPSULE    Take 100 mg by mouth 2 (two) times daily. Take for 10 days   IBUPROFEN (ADVIL,MOTRIN) 200 MG TABLET    Take 200 mg by mouth every 6 (six) hours as needed. Pain    MEDROXYPROGESTERONE (DEPO-PROVERA) 400 MG/ML SUSP    Inject into the muscle once.     MULTIPLE VITAMIN (MULTIVITAMIN) TABLET    Take 1 tablet by mouth daily.     TRAMADOL (ULTRAM) 50 MG TABLET    Take 50 mg by mouth every 6 (six) hours as needed.     TRAMADOL (ULTRAM-ER) 100 MG 24 HR TABLET    Take 100 mg by mouth daily.       ALLERGIES:  Allergies as of 10/28/2010 - Review Complete 10/28/2010  Allergen Reaction Noted  . Penicillins Anaphylaxis and Hives 10/06/2010  . Penicillins cross reactors Other (See Comments) 10/28/2010  . Tape Itching 10/12/2010     Family History  Problem Relation Age of Onset  . Kidney failure Daughter   . Sickle cell anemia Daughter   . Sickle cell anemia Son     History  Substance Use Topics  . Smoking status: Current Everyday Smoker -- 0.5 packs/day for 30 years    Types: Cigarettes  . Smokeless tobacco: Not on file  . Alcohol Use: No    OB History    Grav Para Term Preterm Abortions TAB SAB Ect Mult Living   4 2 2  2            Review of Systems  Constitutional: Negative.   HENT: Negative.   Respiratory: Negative.   Cardiovascular: Negative.   Gastrointestinal: Negative.   Musculoskeletal: Negative.   Skin: Positive for wound (chronic abscesses ).  Neurological: Negative.   Hematological: Negative.   Psychiatric/Behavioral: Negative.     Physical Exam  BP 140/86  Pulse 69  Temp(Src) 98.4 F (36.9 C) (  Oral)  Resp 14  Ht 5\' 4"  (1.626 m)  Wt 166 lb (75.297 kg)  BMI 28.49 kg/m2  SpO2 100%  LMP 08/12/2010    Physical Exam  Constitutional: She appears well-developed and well-nourished.  HENT:  Head: Normocephalic and atraumatic.  Eyes: Conjunctivae are normal. Pupils are equal, round, and reactive to light.  Neck: Neck supple. No tracheal deviation present. No thyromegaly present.  Cardiovascular: Normal rate and regular rhythm.   No murmur heard. Pulmonary/Chest: Effort normal and breath sounds normal.  Abdominal: Soft. Bowel sounds are normal. She exhibits no distension. There is no tenderness.  Genitourinary:       0.5 cm abscess over the left labia majora. Healing abscess to left perineum   Musculoskeletal: Normal range of motion. She exhibits no edema and no  tenderness.  Neurological: She is alert. Coordination normal.  Skin: Skin is warm and dry.  Psychiatric: She has a normal mood and affect.    OTHER DATA REVIEWED: Nursing notes, vital signs, and past medical records reviewed. Prior Records reviewed and indicate: DATE OF PROCEDURE: 10/17/2010  PHYSICIAN: Barbaraann Barthel, M.D.  PREOPERATIVE and POSTOPERATIVE DIAGNOSIS: Chronic perianal abscesses.  DIAGNOSIS: Chronic perianal abscesses.  PROCEDURE:  1. Examination under anesthesia.  2. Rigid proctoscopy to 20 cm.  3. Excision of perianal and (left labia majora cyst).   DIAGNOSTIC STUDIES: Oxygen Saturation is 100% on room airl, normal by my interpretation.     PROCEDURES: INCISION AND DRAINAGE PROCEDURE NOTE: Patient identification was confirmed and consent was obtained . This procedure was performed by Dr. Ethelda Chick at 10:07AM,  Size: 0.5 cm Site: Left labia majora Sterile procedures observed Needle size: 25 gauge  Anesthetic used (type and amt): 2% Lido with Epi  Blade size: 11 Drainage: minimal amount of pus and blood.  Packing used: no  Site anesthetized, incision made over site, wound drained and explored loculations, rinsed with copious amounts of normal saline, covered with dry, sterile dressing. Patient tolerated procedure well without complications. Instructions for care discussed verbally and pt provided with additional written instructions for homecare and f/u. Procedure time out: 10:18AM.   ED COURSE / COORDINATION OF CARE: 10:07 - ED physician at bedside performing I&D of abscess.  10:25 - Call out to Dr. Malvin Johns  10:35 - The case was discussed with Dr. Malvin Johns who recommends Rx of Doxycycline and Percocet, and office follow up on 10/30/10 at 10AM.    MDM:     IMPRESSION: Abscess of anal and rectal regions    PLAN: Discharge  Follow up with Dr. Malvin Johns on 10/30/10 at 10AM.   CONDITION ON DISCHARGE: Stable    MEDICATIONS GIVEN IN THE  E.D.  Medications  Multiple Vitamin (MULTIVITAMIN) tablet (not administered)  ibuprofen (ADVIL,MOTRIN) 200 MG tablet (not administered)  traMADol (ULTRAM) 50 MG tablet (not administered)  doxycycline (VIBRAMYCIN) 100 MG capsule (not administered)  doxycycline (VIBRAMYCIN) 100 MG capsule (not administered)  lidocaine-EPINEPHrine (XYLOCAINE W/EPI) 2 %-1:200000 (PF) injection (   Given 10/28/10 1015)  lidocaine-EPINEPHrine (XYLOCAINE W/EPI) 2 %-1:100000 (with pres) injection 1.7 mL (1.7 mL Infiltration Given 10/28/10 1015)  doxycycline (VIBRA-TABS) tablet 100 mg (100 mg Oral Given 10/28/10 1032)     DISCHARGE MEDICATIONS: New Prescriptions   DOXYCYCLINE (VIBRAMYCIN) 100 MG CAPSULE    Take 1 capsule (100 mg total) by mouth 2 (two) times daily.   DOXYCYCLINE (VIBRAMYCIN) 100 MG CAPSULE    Take 1 capsule (100 mg total) by mouth 2 (two) times daily.     Procedures  I personally performed the services described in this documentation, which was scribed in my presence. The recorded information has been reviewed and considered. Doug Sou, MD   Doug Sou, MD 10/28/10 [phone]

## 2010-10-28 NOTE — ED Notes (Signed)
Abscess or boil L groin

## 2010-10-28 NOTE — ED Notes (Signed)
edp talking with dr Malvin Johns on phone at this time.

## 2011-01-08 LAB — CULTURE, ROUTINE-ABSCESS
Culture: NO GROWTH
Gram Stain: NONE SEEN
Gram Stain: NONE SEEN

## 2011-01-08 LAB — DIFFERENTIAL
Eosinophils Absolute: 0 — ABNORMAL LOW
Lymphs Abs: 2.2
Monocytes Relative: 5
Neutro Abs: 3.7
Neutrophils Relative %: 59

## 2011-01-08 LAB — ANAEROBIC CULTURE

## 2011-01-08 LAB — CBC
Platelets: 363
RBC: 3.91
WBC: 6.2

## 2011-01-08 LAB — HCG, QUANTITATIVE, PREGNANCY: hCG, Beta Chain, Quant, S: <2

## 2011-01-08 LAB — BASIC METABOLIC PANEL
BUN: 9
Calcium: 9.1
Creatinine, Ser: 0.7
GFR calc Af Amer: >60
GFR calc non Af Amer: >60

## 2011-01-11 LAB — URINALYSIS, ROUTINE W REFLEX MICROSCOPIC
Bilirubin Urine: NEGATIVE
Glucose, UA: NEGATIVE
Ketones, ur: NEGATIVE
Leukocytes, UA: NEGATIVE
Protein, ur: NEGATIVE
pH: 6

## 2011-01-11 LAB — URINE MICROSCOPIC-ADD ON

## 2011-01-16 LAB — PREGNANCY, URINE: Preg Test, Ur: NEGATIVE

## 2011-01-16 LAB — URINALYSIS, ROUTINE W REFLEX MICROSCOPIC
Glucose, UA: NEGATIVE
Protein, ur: NEGATIVE

## 2011-01-16 LAB — URINE MICROSCOPIC-ADD ON

## 2011-02-04 ENCOUNTER — Emergency Department (HOSPITAL_COMMUNITY): Payer: Medicaid Other

## 2011-02-04 ENCOUNTER — Encounter (HOSPITAL_COMMUNITY): Payer: Self-pay | Admitting: Emergency Medicine

## 2011-02-04 ENCOUNTER — Other Ambulatory Visit: Payer: Self-pay

## 2011-02-04 ENCOUNTER — Emergency Department (HOSPITAL_COMMUNITY)
Admission: EM | Admit: 2011-02-04 | Discharge: 2011-02-04 | Disposition: A | Discharge status: Home / Self Care | Payer: Medicaid Other | Attending: Emergency Medicine | Admitting: Emergency Medicine

## 2011-02-04 DIAGNOSIS — M25449 Effusion, unspecified hand: Secondary | ICD-10-CM | POA: Insufficient documentation

## 2011-02-04 DIAGNOSIS — D573 Sickle-cell trait: Secondary | ICD-10-CM | POA: Insufficient documentation

## 2011-02-04 DIAGNOSIS — R609 Edema, unspecified: Secondary | ICD-10-CM | POA: Insufficient documentation

## 2011-02-04 DIAGNOSIS — I10 Essential (primary) hypertension: Secondary | ICD-10-CM | POA: Insufficient documentation

## 2011-02-04 DIAGNOSIS — M7989 Other specified soft tissue disorders: Secondary | ICD-10-CM

## 2011-02-04 DIAGNOSIS — R0789 Other chest pain: Secondary | ICD-10-CM | POA: Insufficient documentation

## 2011-02-04 DIAGNOSIS — F17200 Nicotine dependence, unspecified, uncomplicated: Secondary | ICD-10-CM | POA: Insufficient documentation

## 2011-02-04 LAB — COMPREHENSIVE METABOLIC PANEL
Alkaline Phosphatase: 57 U/L (ref 39–117)
BUN: 10 mg/dL (ref 6–23)
CO2: 23 mEq/L (ref 19–32)
Chloride: 105 mEq/L (ref 96–112)
GFR calc Af Amer: >90 mL/min (ref >90)
Glucose, Bld: 104 mg/dL — ABNORMAL HIGH (ref 70–99)
Potassium: 3.9 mEq/L (ref 3.5–5.1)
Total Bilirubin: 0.1 mg/dL — ABNORMAL LOW (ref 0.3–1.2)

## 2011-02-04 LAB — POCT I-STAT TROPONIN I: Troponin i, poc: 0 ng/mL (ref 0.00–0.08)

## 2011-02-04 LAB — DIFFERENTIAL
Eosinophils Absolute: 0.1 10*3/uL (ref 0.0–0.7)
Lymphs Abs: 1.7 10*3/uL (ref 0.7–4.0)
Monocytes Relative: 5 % (ref 3–12)
Neutro Abs: 3 10*3/uL (ref 1.7–7.7)
Neutrophils Relative %: 60 % (ref 43–77)

## 2011-02-04 LAB — CBC
Hemoglobin: 11.4 g/dL — ABNORMAL LOW (ref 12.0–15.0)
MCH: 29.1 pg (ref 26.0–34.0)
RBC: 3.92 MIL/uL (ref 3.87–5.11)

## 2011-02-04 MED ORDER — TRAMADOL HCL 50 MG PO TABS: 50.0000 mg | ORAL_TABLET | Freq: Four times a day (QID) | ORAL | Status: AC | PRN

## 2011-02-04 NOTE — ED Notes (Signed)
Patient c/o intermittent chest pain starting several days ago and hand swelling for approximately one week. Describes chest pain as pressure that was in the middle of her chest and mid back. Denies n/v and denies diaphoresis at time of symptom onset.

## 2011-02-04 NOTE — ED Provider Notes (Signed)
Scribed for Benny Lennert, MD, the patient was seen in room [facility]04/[facility] . This chart was scribed by Ellie Lunch.   CSN: [id number] Arrival date & time: 02/04/2011  7:35 AM   First MD Initiated Contact with Patient 02/04/11 [phone]      Chief Complaint  Patient presents with  . Chest Pain  . Hand Swelling     (Consider location/radiation/quality/duration/timing/severity/associated sxs/prior treatment) HPI  Amber Drake is a 50 y.o. female who presents to the Emergency Department complaining of swelling in her fingers for the past 2 days. Pt says swelling constant. Swelling is worse at night and in the morning when she wakes up. Pt reports some associated numbness in her fingers. Pt denies any pain associated with the swelling. Nothing makes the swelling better. 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  Additionally pt reports intermittent chest pain starting 3 days ago. Pt describes the pain as a "gasy pain." Pain starts in right arm and radiates to chest and back., Pt says pain is moderate at it's worst. Pt denies any current pain. Each episode lasts ~ 5 minutes. Pt has had one episode in the last 3 days. Pt denies diaphoresis or SOB during episode. Pain is not made worse by exertion. Pain is resolved with OTC GasX. Pt denies a history of heart problems. Pt denies a family history of heart problems. Pt smokes .5 packs of cigarettes a day. There are no other associated symptoms and no other alleviating or aggravating factors.   PCP Dr. Felecia Shelling  Past Medical History  Diagnosis Date  . Sickle cell trait   . Hypertension     does not take meds  . Blood dyscrasia     sickle cell trait    Past Surgical History  Procedure Date  . Appendectomy   . Incision and drainage perirectal abscess 12/21/09  . Cesarean section     x 2  . Proctoscopy 10/17/2010    Procedure: PROCTOSCOPY;  Surgeon: Marlane Hatcher;  Location: [facility];   Service: General;  Laterality: N/A;  Rigid Proctoscopy/Possible Fistula in Ano  Procedure ended at 1003    Family History  Problem Relation Age of Onset  . Kidney failure Daughter   . Sickle cell anemia Daughter   . Sickle cell anemia Son     History  Substance Use Topics  . Smoking status: Current Everyday Smoker -- 0.5 packs/day for 30 years    Types: Cigarettes  . Smokeless tobacco: Not on file  . Alcohol Use: No    OB History    Grav Para Term Preterm Abortions TAB SAB Ect Mult Living   4 2 2  2            Review of Systems  Constitutional: Negative for fatigue.  HENT: Negative for congestion, sinus pressure and ear discharge.   Eyes: Negative for discharge.  Respiratory: Negative for cough.   Cardiovascular: Negative for chest pain.  Gastrointestinal: Negative for abdominal pain and diarrhea.  Genitourinary: Negative for frequency and hematuria.  Musculoskeletal: Positive for joint swelling (finger swelling). Negative for back pain.  Skin: Negative for rash.  Neurological: Negative for seizures and headaches.  Hematological: Negative.   Psychiatric/Behavioral: Negative for hallucinations.  All other systems reviewed and are negative.    Allergies  Penicillins; Penicillins cross reactors; and Tape  Home Medications   Current Outpatient Rx  Name Route Sig Dispense Refill  .  IBUPROFEN 200 MG PO TABS Oral Take 200 mg by mouth every 6 (six) hours as needed. Pain     . ONE-DAILY MULTI VITAMINS PO TABS Oral Take 1 tablet by mouth daily.      . TRAMADOL HCL 50 MG PO TABS Oral Take 50 mg by mouth every 6 (six) hours as needed. For pain    . MEDROXYPROGESTERONE ACETATE 400 MG/ML IM SUSP Intramuscular Inject 400 mg into the muscle every 3 (three) months. Next dose is due 02/23/11    . TRAMADOL HCL 50 MG PO TABS Oral Take 1 tablet (50 mg total) by mouth every 6 (six) hours as needed for pain. Maximum dose= 8 tablets per day 15 tablet 0    BP 165/86  Pulse 75   Temp(Src) 98.3 F (36.8 C) (Oral)  Resp 18  Ht 5\' 4"  (1.626 m)  Wt 165 lb (74.844 kg)  BMI 28.32 kg/m2  SpO2 100%  Physical Exam  Constitutional: She is oriented to person, place, and time. She appears well-developed.  HENT:  Head: Normocephalic and atraumatic.  Eyes: Conjunctivae and EOM are normal. No scleral icterus.  Neck: Neck supple. No thyromegaly present.  Cardiovascular: Normal rate and regular rhythm.  Exam reveals no gallop and no friction rub.   No murmur heard. Pulmonary/Chest: No stridor. She has no wheezes. She has no rales. She exhibits no tenderness.  Abdominal: She exhibits no distension. There is no tenderness. There is no rebound.  Musculoskeletal: Normal range of motion. She exhibits edema.       Mild swelling/stiffness noted on all 10 digits.   Lymphadenopathy:    She has no cervical adenopathy.  Neurological: She is oriented to person, place, and time. Coordination normal.  Skin: Skin is dry. No rash noted. No erythema.       Dry skin noted on hands  Psychiatric: She has a normal mood and affect. Her behavior is normal.    ED Course  Procedures (including critical care time) OTHER DATA REVIEWED: Nursing notes, vital signs, and past medical records reviewed.   DIAGNOSTIC STUDIES: Oxygen Saturation is 100% on room air, normal by my interpretation.    Results for orders placed during the hospital encounter of 02/04/11  CBC      Component Value Range   WBC 5.0  4.0 - 10.5 (K/uL)   RBC 3.92  3.87 - 5.11 (MIL/uL)   Hemoglobin 11.4 (*) 12.0 - 15.0 (g/dL)   HCT 96.0 (*) 45.4 - 46.0 (%)   MCV 83.9  78.0 - 100.0 (fL)   MCH 29.1  26.0 - 34.0 (pg)   MCHC 34.7  30.0 - 36.0 (g/dL)   RDW 09.8  11.9 - 14.7 (%)   Platelets 342  150 - 400 (K/uL)  DIFFERENTIAL      Component Value Range   Neutrophils Relative 60  43 - 77 (%)   Neutro Abs 3.0  1.7 - 7.7 (K/uL)   Lymphocytes Relative 33  12 - 46 (%)   Lymphs Abs 1.7  0.7 - 4.0 (K/uL)   Monocytes Relative 5  3  - 12 (%)   Monocytes Absolute 0.3  0.1 - 1.0 (K/uL)   Eosinophils Relative 2  0 - 5 (%)   Eosinophils Absolute 0.1  0.0 - 0.7 (K/uL)   Basophils Relative 0  0 - 1 (%)   Basophils Absolute 0.0  0.0 - 0.1 (K/uL)  COMPREHENSIVE METABOLIC PANEL      Component Value Range   Sodium 136  135 - 145 (mEq/L)   Potassium 3.9  3.5 - 5.1 (mEq/L)   Chloride 105  96 - 112 (mEq/L)   CO2 23  19 - 32 (mEq/L)   Glucose, Bld 104 (*) 70 - 99 (mg/dL)   BUN 10  6 - 23 (mg/dL)   Creatinine, Ser 1.61  0.50 - 1.10 (mg/dL)   Calcium 9.5  8.4 - 09.6 (mg/dL)   Total Protein 6.8  6.0 - 8.3 (g/dL)   Albumin 3.2 (*) 3.5 - 5.2 (g/dL)   AST 12  0 - 37 (U/L)   ALT 6  0 - 35 (U/L)   Alkaline Phosphatase 57  39 - 117 (U/L)   Total Bilirubin 0.1 (*) 0.3 - 1.2 (mg/dL)   GFR calc non Af Amer >90  >90 (mL/min)   GFR calc Af Amer >90  >90 (mL/min)  POCT I-STAT TROPONIN I      Component Value Range   Troponin i, poc 0.00  0.00 - 0.08 (ng/mL)   Comment 3            Dg Chest 2 View  02/04/2011  *RADIOLOGY REPORT*  Clinical Data: Chest pain.  CHEST - 2 VIEW  Comparison: 07/08/2010  Findings: Heart and mediastinal contours are within normal limits. No focal opacities or effusions.  No acute bony abnormality.  IMPRESSION: No active cardiopulmonary disease.  Original Report Authenticated By: Cyndie Chime, M.D.    1. Chest pain, atypical   2. Hand swelling    Pt not having pain at discharge   MDM  Inflamed hands from working with hands in soapy water all the time  The chart was scribed for me under my direct supervision.  I personally performed the history, physical, and medical decision making and all procedures in the evaluation of this patient..    Date: 02/04/2011  Rate: 69  Rhythm: normal sinus rhythm  QRS Axis: normal  Intervals: normal  ST/T Wave abnormalities: normal   Conduction Disutrbances:none   Narrative Interpretation: poor r wave progression  Old EKG Reviewed: none available       Benny Lennert, MD 02/04/11 1117

## 2011-03-22 ENCOUNTER — Encounter (HOSPITAL_COMMUNITY): Payer: Self-pay | Admitting: Pharmacy Technician

## 2011-03-22 ENCOUNTER — Encounter (HOSPITAL_COMMUNITY): Payer: Self-pay

## 2011-03-22 ENCOUNTER — Encounter (HOSPITAL_COMMUNITY)
Admission: RE | Admit: 2011-03-22 | Discharge: 2011-03-22 | Payer: Medicaid Other | Source: Ambulatory Visit | Attending: Orthopaedic Surgery | Admitting: Orthopaedic Surgery

## 2011-03-22 LAB — DIFFERENTIAL
Basophils Relative: 0 % (ref 0–1)
Eosinophils Absolute: 0.1 10*3/uL (ref 0.0–0.7)
Eosinophils Relative: 1 % (ref 0–5)
Lymphs Abs: 2.2 10*3/uL (ref 0.7–4.0)
Monocytes Relative: 5 % (ref 3–12)

## 2011-03-22 LAB — SURGICAL PCR SCREEN
MRSA, PCR: NEGATIVE
Staphylococcus aureus: NEGATIVE

## 2011-03-22 LAB — URINALYSIS, ROUTINE W REFLEX MICROSCOPIC
Leukocytes, UA: NEGATIVE
Nitrite: NEGATIVE
Specific Gravity, Urine: 1.01 (ref 1.005–1.030)
pH: 6 (ref 5.0–8.0)

## 2011-03-22 LAB — CBC
Hemoglobin: 12.2 g/dL (ref 12.0–15.0)
MCH: 28.8 pg (ref 26.0–34.0)
MCHC: 34.4 g/dL (ref 30.0–36.0)
MCV: 83.7 fL (ref 78.0–100.0)
Platelets: 394 10*3/uL (ref 150–400)
RBC: 4.24 MIL/uL (ref 3.87–5.11)

## 2011-03-22 LAB — COMPREHENSIVE METABOLIC PANEL
Albumin: 3.8 g/dL (ref 3.5–5.2)
BUN: 7 mg/dL (ref 6–23)
Calcium: 10.3 mg/dL (ref 8.4–10.5)
GFR calc Af Amer: >90 mL/min (ref >90)
Glucose, Bld: 92 mg/dL (ref 70–99)
Total Protein: 7.6 g/dL (ref 6.0–8.3)

## 2011-03-22 LAB — URINE MICROSCOPIC-ADD ON

## 2011-03-22 MED ORDER — ONDANSETRON HCL 4 MG/2ML IJ SOLN: 4.0000 mg | Freq: Once | INTRAMUSCULAR | Status: DC | PRN

## 2011-03-22 MED ORDER — FENTANYL CITRATE 0.05 MG/ML IJ SOLN: 25.0000 ug | INTRAMUSCULAR | Status: DC | PRN

## 2011-03-22 NOTE — Patient Instructions (Addendum)
20 MALAN WERK  03/22/2011   Your procedure is scheduled on:  03/23/2011  Report to [facility] at  615  AM.  Call this number if you have problems the morning of surgery: [phone]   Remember:   Do not eat food:After Midnight.  May have clear liquids:until Midnight .  Clear liquids include soda, tea, black coffee, apple or grape juice, broth.  Take these medicines the morning of surgery with A SIP OF WATER: ultram   Do not wear jewelry, make-up or nail polish.  Do not wear lotions, powders, or perfumes. You may wear deodorant.  Do not shave 48 hours prior to surgery.  Do not bring valuables to the hospital.  Contacts, dentures or bridgework may not be worn into surgery.  Leave suitcase in the car. After surgery it may be brought to your room.  For patients admitted to the hospital, checkout time is 11:00 AM the day of discharge.   Patients discharged the day of surgery will not be allowed to drive home.  Name and phone number of your driver: family  Special Instructions: CHG Shower Use Special Wash: 1/2 bottle night before surgery and 1/2 bottle morning of surgery.   Please read over the following fact sheets that you were given: Pain Booklet, MRSA Information, Surgical Site Infection Prevention, Anesthesia Post-op Instructions and Care and Recovery After Surgery Carpal Tunnel Release Carpal tunnel release is done to relieve the pressure on the nerves and tendons on the bottom side of your wrist.  LET YOUR CAREGIVER KNOW ABOUT:   Allergies to food or medicine.   Medicines taken, including vitamins, herbs, eyedrops, over-the-counter medicines, and creams.   Use of steroids (by mouth or creams).   Previous problems with anesthetics or numbing medicines.   History of bleeding problems or blood clots.   Previous surgery.   Other health problems, including diabetes and kidney problems.   Possibility of pregnancy, if this applies.  RISKS AND COMPLICATIONS  Some problems  that may happen after this procedure include:  Infection.   Damage to the nerves, arteries or tendons could occur. This would be very uncommon.   Bleeding.  BEFORE THE PROCEDURE   This surgery may be done while you are asleep (general anesthetic) or may be done under a block where only your forearm and the surgical area is numb.   If the surgery is done under a block, the numbness will gradually wear off within several hours after surgery.  HOME CARE INSTRUCTIONS   Have a responsible person with you for 24 hours.   Do not drive a car or use public transportation for 24 hours.   Only take over-the-counter or prescription medicines for pain, discomfort, or fever as directed by your caregiver. Take them as directed.   You may put ice on the palm side of the affected wrist.   Put ice in a plastic bag.   Place a towel between your skin and the bag.   Leave the ice on for 20 to 30 minutes, 4 times per day.   If you were given a splint to keep your wrist from bending, use it as directed. It is important to wear the splint at night or as directed. Use the splint for as long as you have pain or numbness in your hand, arm, or wrist. This may take 1 to 2 months.   Keep your hand raised (elevated) above the level of your heart as much as possible. This keeps swelling down  and helps with discomfort.   Change bandages (dressings) as directed.   Keep the wound clean and dry.  SEEK MEDICAL CARE IF:   You develop pain not relieved with medications.   You develop numbness of your hand.   You develop bleeding from your surgical site.   You have an oral temperature above 102 F (38.9 C).   You develop redness or swelling of the surgical site.   You develop new, unexplained problems.  SEEK IMMEDIATE MEDICAL CARE IF:   You develop a rash.   You have difficulty breathing.   You develop any reaction or side effects to medications given.  Document Released: 06/09/2003 Document Revised:  11/29/2010 Document Reviewed: 01/23/2007 [facility] Patient Information 2012 [place], [place].PATIENT INSTRUCTIONS POST-ANESTHESIA  IMMEDIATELY FOLLOWING SURGERY:  Do not drive or operate machinery for the first twenty four hours after surgery.  Do not make any important decisions for twenty four hours after surgery or while taking narcotic pain medications or sedatives.  If you develop intractable nausea and vomiting or a severe headache please notify your doctor immediately.  FOLLOW-UP:  Please make an appointment with your surgeon as instructed. You do not need to follow up with anesthesia unless specifically instructed to do so.  WOUND CARE INSTRUCTIONS (if applicable):  Keep a dry clean dressing on the anesthesia/puncture wound site if there is drainage.  Once the wound has quit draining you may leave it open to air.  Generally you should leave the bandage intact for twenty four hours unless there is drainage.  If the epidural site drains for more than 36-48 hours please call the anesthesia department.  QUESTIONS?:  Please feel free to call your physician or the hospital operator if you have any questions, and they will be happy to assist you.     [facility] Anesthesia Department [address] [place] [phone]

## 2011-03-22 NOTE — H&P (Signed)
Amber Drake is an 50 y.o. female.   Chief Complaint: Carpal tunnel  HPI: She has several month history of increasing pain of both hands with nocturnal numbness in the median nerve distribution.  EMGs by Dr. Gerilyn Pilgrim on 03/19/11 shows bilateral carpal tunnel syndrome, moderate.  She has tried rest, medications, splints, exercise without help.  I have explained the procedure and risks and imponderables of carpal tunnel release, open and she understands.  She asked appropriate questions.  She wants this done prior to Christmas.  Past Medical History  Diagnosis Date  . Sickle cell trait   . Hypertension     does not take meds  . Blood dyscrasia     sickle cell trait    Past Surgical History  Procedure Date  . Appendectomy   . Incision and drainage perirectal abscess 12/21/09  . Cesarean section     x 2  . Proctoscopy 10/17/2010    Procedure: PROCTOSCOPY;  Surgeon: Marlane Hatcher;  Location: [facility];  Service: General;  Laterality: N/A;  Rigid Proctoscopy/Possible Fistula in Ano  Procedure ended at 1003    Family History  Problem Relation Age of Onset  . Kidney failure Daughter   . Sickle cell anemia Daughter   . Sickle cell anemia Son    Social History:  reports that she has been smoking Cigarettes.  She has a 15 pack-year smoking history. She does not have any smokeless tobacco history on file. She reports that she does not drink alcohol or use illicit drugs.  Allergies:  Allergies  Allergen Reactions  . Penicillins Anaphylaxis and Hives  . Penicillins Cross Reactors Itching and Swelling  . Tape Itching    No current facility-administered medications on file as of .   Medications Prior to Admission  Medication Sig Dispense Refill  . ibuprofen (ADVIL,MOTRIN) 200 MG tablet Take 200 mg by mouth every 6 (six) hours as needed. Pain       . medroxyPROGESTERone (DEPO-PROVERA) 400 MG/ML SUSP Inject 400 mg into the muscle every 3 (three) months. Next dose is due 02/23/11        . Multiple Vitamin (MULTIVITAMIN) tablet Take 1 tablet by mouth daily.        . traMADol (ULTRAM) 50 MG tablet Take 50 mg by mouth every 6 (six) hours as needed. For pain        No results found for this or any previous visit (from the past 48 hour(s)). No results found.  Review of Systems  Constitutional: Negative.   HENT: Negative.   Eyes: Negative.   Respiratory: Negative.   Cardiovascular: Negative.   Gastrointestinal: Negative.   Genitourinary: Negative.   Musculoskeletal: Positive for joint pain (both wrists, carpal tunnel syndrome).  Skin: Negative.   Neurological: Negative.   Endo/Heme/Allergies: Negative.   Psychiatric/Behavioral: Negative.     There were no vitals taken for this visit. Physical Exam  Constitutional: She is oriented to person, place, and time. She appears well-developed and well-nourished.  HENT:  Head: Normocephalic and atraumatic.  Eyes: Conjunctivae and EOM are normal. Pupils are equal, round, and reactive to light.  Neck: Normal range of motion. Neck supple.  Cardiovascular: Normal rate, regular rhythm, normal heart sounds and intact distal pulses.   Respiratory: Effort normal and breath sounds normal.  GI: Soft. Bowel sounds are normal.  Musculoskeletal: She exhibits tenderness (pain both wrists with positive Tinel and Phalen signs.  Decreased sensation of the median nerve distribution.).  Neurological: She is alert and oriented  to person, place, and time. She has normal reflexes.  Skin: Skin is warm and dry.  Psychiatric: She has a normal mood and affect. Her behavior is normal. Judgment and thought content normal.     Assessment/Plan Carpal tunnel syndrome, bilaterally.  To do surgery on the left hand first.  This is an outpatient procedure.  Alijah Akram 03/22/2011, 2:06 PM

## 2011-03-23 ENCOUNTER — Encounter (HOSPITAL_COMMUNITY)
Admission: RE | Disposition: A | Discharge status: Home / Self Care | Payer: Self-pay | Source: Ambulatory Visit | Attending: Orthopaedic Surgery

## 2011-03-23 ENCOUNTER — Encounter (HOSPITAL_COMMUNITY): Payer: Self-pay | Admitting: *Deleted

## 2011-03-23 ENCOUNTER — Ambulatory Visit (HOSPITAL_COMMUNITY): Payer: Medicaid Other | Admitting: Anesthesiology

## 2011-03-23 ENCOUNTER — Other Ambulatory Visit: Payer: Self-pay | Admitting: Orthopaedic Surgery

## 2011-03-23 ENCOUNTER — Ambulatory Visit (HOSPITAL_COMMUNITY)
Admission: RE | Admit: 2011-03-23 | Discharge: 2011-03-23 | Disposition: A | Discharge status: Home / Self Care | Payer: Medicaid Other | Source: Ambulatory Visit | Attending: Orthopaedic Surgery | Admitting: Orthopaedic Surgery

## 2011-03-23 ENCOUNTER — Encounter (HOSPITAL_COMMUNITY): Payer: Self-pay | Admitting: Anesthesiology

## 2011-03-23 DIAGNOSIS — Z79899 Other long term (current) drug therapy: Secondary | ICD-10-CM | POA: Insufficient documentation

## 2011-03-23 DIAGNOSIS — Z01812 Encounter for preprocedural laboratory examination: Secondary | ICD-10-CM | POA: Insufficient documentation

## 2011-03-23 DIAGNOSIS — G5600 Carpal tunnel syndrome, unspecified upper limb: Secondary | ICD-10-CM | POA: Insufficient documentation

## 2011-03-23 DIAGNOSIS — I10 Essential (primary) hypertension: Secondary | ICD-10-CM | POA: Insufficient documentation

## 2011-03-23 HISTORY — PX: CARPAL TUNNEL RELEASE: SHX101

## 2011-03-23 SURGERY — CARPAL TUNNEL RELEASE
Anesthesia: Regional | Site: Wrist | Laterality: Left | Wound class: Clean

## 2011-03-23 MED ORDER — LIDOCAINE HCL (PF) 0.5 % IJ SOLN
INTRAMUSCULAR | Status: DC | PRN
  Administered 2011-03-23: 50 mL

## 2011-03-23 MED ORDER — BUPIVACAINE HCL (PF) 0.25 % IJ SOLN
INTRAMUSCULAR | Status: AC
  Filled 2011-03-23: qty 30

## 2011-03-23 MED ORDER — LIDOCAINE HCL (PF) 1 % IJ SOLN
INTRAMUSCULAR | Status: AC
  Filled 2011-03-23: qty 5

## 2011-03-23 MED ORDER — FENTANYL CITRATE 0.05 MG/ML IJ SOLN
25.0000 ug | INTRAMUSCULAR | Status: DC | PRN
  Administered 2011-03-23 (×3): 50 ug via INTRAVENOUS

## 2011-03-23 MED ORDER — SODIUM CHLORIDE 0.9 % IJ SOLN
INTRAMUSCULAR | Status: DC | PRN
  Administered 2011-03-23: 1.5 mL via INTRAVENOUS

## 2011-03-23 MED ORDER — MIDAZOLAM HCL 2 MG/2ML IJ SOLN
1.0000 mg | INTRAMUSCULAR | Status: DC | PRN
  Administered 2011-03-23: 2 mg via INTRAVENOUS

## 2011-03-23 MED ORDER — SODIUM CHLORIDE 0.9 % IR SOLN
Status: DC | PRN
  Administered 2011-03-23: 1000 mL

## 2011-03-23 MED ORDER — FENTANYL CITRATE 0.05 MG/ML IJ SOLN
INTRAMUSCULAR | Status: DC | PRN
  Administered 2011-03-23 (×2): 50 ug via INTRAVENOUS

## 2011-03-23 MED ORDER — FENTANYL CITRATE 0.05 MG/ML IJ SOLN
INTRAMUSCULAR | Status: AC
  Administered 2011-03-23: 50 ug via INTRAVENOUS
  Filled 2011-03-23: qty 2

## 2011-03-23 MED ORDER — PROPOFOL 10 MG/ML IV EMUL
INTRAVENOUS | Status: AC
  Filled 2011-03-23: qty 20

## 2011-03-23 MED ORDER — LIDOCAINE HCL (PF) 0.5 % IJ SOLN
INTRAMUSCULAR | Status: AC
  Filled 2011-03-23: qty 50

## 2011-03-23 MED ORDER — MIDAZOLAM HCL 2 MG/2ML IJ SOLN
INTRAMUSCULAR | Status: AC
  Administered 2011-03-23: 2 mg via INTRAVENOUS
  Filled 2011-03-23: qty 2

## 2011-03-23 MED ORDER — PROPOFOL 10 MG/ML IV EMUL
INTRAVENOUS | Status: DC | PRN
  Administered 2011-03-23: 50 ug/kg/min via INTRAVENOUS

## 2011-03-23 MED ORDER — LACTATED RINGERS IV SOLN
INTRAVENOUS | Status: DC
  Administered 2011-03-23: 1000 mL via INTRAVENOUS

## 2011-03-23 MED ORDER — ONDANSETRON HCL 4 MG/2ML IJ SOLN: 4.0000 mg | Freq: Once | INTRAMUSCULAR | Status: DC | PRN

## 2011-03-23 SURGICAL SUPPLY — 40 items
BAG HAMPER (MISCELLANEOUS) ×2 IMPLANT
BANDAGE ELASTIC 3 VELCRO NS (GAUZE/BANDAGES/DRESSINGS) ×2 IMPLANT
BANDAGE ESMARK 4X12 BL STRL LF (DISPOSABLE) ×1 IMPLANT
BLADE SURG 15 STRL LF DISP TIS (BLADE) ×1 IMPLANT
BLADE SURG 15 STRL SS (BLADE) ×2
BNDG CMPR 12X4 ELC STRL LF (DISPOSABLE) ×1
BNDG ESMARK 4X12 BLUE STRL LF (DISPOSABLE) ×2
CLOTH BEACON ORANGE TIMEOUT ST (SAFETY) ×2 IMPLANT
COVER LIGHT HANDLE STERIS (MISCELLANEOUS) ×4 IMPLANT
CUFF TOURNIQUET SINGLE 18IN (TOURNIQUET CUFF) ×2 IMPLANT
DRSG XEROFORM 1X8 (GAUZE/BANDAGES/DRESSINGS) IMPLANT
ELECT NDL TIP 2.8 STRL (NEEDLE) IMPLANT
ELECT NEEDLE TIP 2.8 STRL (NEEDLE) IMPLANT
ELECT REM PT RETURN 9FT ADLT (ELECTROSURGICAL) ×2
ELECTRODE REM PT RTRN 9FT ADLT (ELECTROSURGICAL) ×1 IMPLANT
FORMALIN 10 PREFIL 120ML (MISCELLANEOUS) ×2 IMPLANT
GLOVE BIO SURGEON STRL SZ8 (GLOVE) ×2 IMPLANT
GLOVE BIO SURGEON STRL SZ8.5 (GLOVE) ×2 IMPLANT
GLOVE EXAM NITRILE MD LF STRL (GLOVE) ×1 IMPLANT
GLOVE INDICATOR 7.0 STRL GRN (GLOVE) ×1 IMPLANT
GLOVE SS BIOGEL STRL SZ 6.5 (GLOVE) IMPLANT
GLOVE SUPERSENSE BIOGEL SZ 6.5 (GLOVE) ×1
GOWN STRL REIN XL XLG (GOWN DISPOSABLE) ×4 IMPLANT
KIT ROOM TURNOVER APOR (KITS) ×2 IMPLANT
NDL HYPO 18GX1.5 BLUNT FILL (NEEDLE) ×1 IMPLANT
NDL HYPO 27GX1-1/4 (NEEDLE) ×1 IMPLANT
NEEDLE HYPO 18GX1.5 BLUNT FILL (NEEDLE) ×2 IMPLANT
NEEDLE HYPO 27GX1-1/4 (NEEDLE) ×2 IMPLANT
NS IRRIG 1000ML POUR BTL (IV SOLUTION) ×2 IMPLANT
PACK BASIC LIMB (CUSTOM PROCEDURE TRAY) ×2 IMPLANT
PAD ARMBOARD 7.5X6 YLW CONV (MISCELLANEOUS) ×2 IMPLANT
PAD CAST 3X4 CTTN HI CHSV (CAST SUPPLIES) ×1 IMPLANT
PADDING CAST COTTON 3X4 STRL (CAST SUPPLIES) ×2
SET BASIN LINEN APH (SET/KITS/TRAYS/PACK) ×2 IMPLANT
SOL PREP PROV IODINE SCRUB 4OZ (MISCELLANEOUS) ×2 IMPLANT
SPONGE GAUZE 4X4 12PLY (GAUZE/BANDAGES/DRESSINGS) ×2 IMPLANT
SUT ETHILON 3 0 FSL (SUTURE) ×2 IMPLANT
SYR 3ML LL SCALE MARK (SYRINGE) ×2 IMPLANT
TOWEL OR 17X26 4PK STRL BLUE (TOWEL DISPOSABLE) ×1 IMPLANT
VESSEL LOOPS MAXI RED (MISCELLANEOUS) ×2 IMPLANT

## 2011-03-23 NOTE — Transfer of Care (Signed)
Immediate Anesthesia Transfer of Care Note  Patient: Amber Drake  Procedure(s) Performed:  CARPAL TUNNEL RELEASE  Patient Location: PACU  Anesthesia Type: Bier Block  Level of Consciousness: awake  Airway & Oxygen Therapy: Patient Spontanous Breathing.   Post-op Assessment: Report given to PACU RN, Post -op Vital signs reviewed and stable and Patient moving all extremities  Post vital signs: Reviewed and stable  Complications: No apparent anesthesia complications

## 2011-03-23 NOTE — Anesthesia Preprocedure Evaluation (Signed)
Anesthesia Evaluation  Patient identified by MRN, date of birth, ID band Patient awake    Reviewed: Allergy & Precautions, H&P , Patient's Chart, lab work & pertinent test results  History of Anesthesia Complications Negative for: history of anesthetic complications  Airway Mallampati: II TM Distance: >3 FB Neck ROM: Full    Dental  (+) Edentulous Upper   Pulmonary    Pulmonary exam normal       Cardiovascular hypertension, Regular Normal    Neuro/Psych    GI/Hepatic   Endo/Other    Renal/GU      Musculoskeletal   Abdominal   Peds  Hematology  (+) Blood dyscrasia, Sickle cell trait ,   Anesthesia Other Findings   Reproductive/Obstetrics                           Anesthesia Physical Anesthesia Plan  ASA: II  Anesthesia Plan: Bier Block   Post-op Pain Management:    Induction:   Airway Management Planned: Nasal Cannula  Additional Equipment:   Intra-op Plan:   Post-operative Plan:   Informed Consent: I have reviewed the patients History and Physical, chart, labs and discussed the procedure including the risks, benefits and alternatives for the proposed anesthesia with the patient or authorized representative who has indicated his/her understanding and acceptance.     Plan Discussed with:   Anesthesia Plan Comments:         Anesthesia Quick Evaluation

## 2011-03-23 NOTE — Progress Notes (Signed)
The History and Physical is unchanged. I have examined the patient. The patient is medically able to have surgery on the left hand for carpal tunnel release . Amber Drake

## 2011-03-23 NOTE — Anesthesia Postprocedure Evaluation (Signed)
Anesthesia Post Note  Patient: Amber Drake  Procedure(s) Performed:  CARPAL TUNNEL RELEASE  Anesthesia type: Musician  Patient location: PACU  Post pain: Pain level controlled  Post assessment: Post-op Vital signs reviewed, Patient's Cardiovascular Status Stable, Respiratory Function Stable, Patent Airway, No signs of Nausea or vomiting and Pain level controlled  Last Vitals:  Filed Vitals:   03/23/11 0803  BP: 133/86  Pulse: 82  Temp: 36.7 C  Resp: 16    Post vital signs: Reviewed and stable  Level of consciousness: awake and alert   Complications: No apparent anesthesia complications

## 2011-03-23 NOTE — Anesthesia Procedure Notes (Addendum)
Anesthesia Regional Block:  Bier block (IV Regional)  Pre-Anesthetic Checklist: ,, timeout performed, Correct Patient, Correct Site, Correct Laterality, Correct Procedure, Correct Position, site marked, Risks and benefits discussed, Surgical consent, Pre-op evaluation Bier block (IV Regional) Narrative:  Start time: 03/23/2011 7:29 AM End time: 03/23/2011 7:31 AM  Performed by: Personally   Additional Notes: Pulse checked pre and post tourniquet inflatiion   22g IV in left hand d/c per block completion   Date/Time: 03/23/2011 7:22 AM Performed by: Minerva Areola Pre-anesthesia Checklist: Patient identified, Timeout performed, Emergency Drugs available, Suction available and Patient being monitored Oxygen Delivery Method: Simple face mask

## 2011-03-23 NOTE — Brief Op Note (Signed)
03/23/2011  7:58 AM  PATIENT:  Dickie La  50 y.o. female  PRE-OPERATIVE DIAGNOSIS:  carpal tunnel syndrome left wrist  POST-OPERATIVE DIAGNOSIS:  carpal tunnel syndrome left wrist  PROCEDURE:  Procedure(s): CARPAL TUNNEL RELEASE left  SURGEON:  Surgeon(s): [employer]  PHYSICIAN ASSISTANT:   ASSISTANTS: none   ANESTHESIA:   regional  EBL:  Total I/O In: 350 [I.V.:350] Out: 0   BLOOD ADMINISTERED:none  DRAINS: none   LOCAL MEDICATIONS USED:  NONE  SPECIMEN:  Source of Specimen:  left wrist  volar carpal ligament  DISPOSITION OF SPECIMEN:  PATHOLOGY  COUNTS:  YES  TOURNIQUET:   Total Tourniquet Time Documented: Upper Arm (Left) - 27 minutes  DICTATION: .Other Dictation: Dictation Number [id number]  PLAN OF CARE: Discharge to home after PACU  PATIENT DISPOSITION:  PACU - hemodynamically stable.   Delay start of Pharmacological VTE agent (>24hrs) due to surgical blood loss or risk of bleeding:  n/a

## 2011-03-24 NOTE — Op Note (Signed)
Amber Drake, Amber Drake              ACCOUNT NO.:  [id number]  MEDICAL RECORD NO.:  [id number]  LOCATION:  [facility]                          FACILITY:  [facility]  PHYSICIAN:  J. Darreld Mclean, M.D. DATE OF BIRTH:  05-Sep-1960  DATE OF PROCEDURE:  03/23/2011 DATE OF DISCHARGE:  03/23/2011                              OPERATIVE REPORT   PREOPERATIVE DIAGNOSIS:  Carpal tunnel syndrome, left.  POSTOP DIAGNOSIS:  Carpal tunnel syndrome, left.  PROCEDURE:  Open release of volar carpal ligament, saline neurolysis, epineurotomy, left median nerve.  ANESTHESIA:  Regional Bier block.  Please refer anesthesia record for total tourniquet time.  No drains.  No splints.  SURGEON:  J. Darreld Mclean, MD  INDICATIONS:  The patient has pain and tenderness particularly nocturnal both hands, left greater than the right.  It has been going on for several months, getting progressively worse.  She has tried splints, rest, anti-inflammatories without success.  She had EMG showing bilateral carpal tunnel moderate to both wrist, this was done by Dr. Gerilyn Pilgrim this past week.  The patient might have surgery before Christmas and I saw her in my office yesterday and she wanted to have it done as soon as possible and was an opening in this schedule this morning, and she elected to take to do this.  I went over the risks and imponderables of the procedure preoperatively and she appeared to understand.  She asked appropriate questions.  DESCRIPTION OF PROCEDURE:  The patient was seen in the holding area and she identified the left wrist as the correct surgical site.  I placed a mark on the left wrist volarly.  She was brought to the operating room, given Bier block anesthesia while supine.  She was prepped and draped in the usual manner.  Had a generalized time-out identifying the patient as Amber Drake, doing carpal tunnel syndrome surgery for the left.  All instrumentations were properly positioned and working,  and OR team knew each other.  Incision was made with careful dissection, and the median nerve was identified proximally, vessel loop placed around the nerve.  A grooved director was placed within the carpal tunnel space and volar carpal ligaments, then incised, and that was obviously compressed.  Retinaculum was cut proximally.  Saline neurolysis was done after injecting the epineurium area with pure saline.  Specimen of volar carpal ligament was sent to pathology.  Nerve was inspected, no apparent injury.  Vessel loop removed.  Wound was reapproximated with 3-0 nylon interrupted vertical mattress manner.  Sterile dressing applied.  Bulky dressing applied.  Sheet cotton applied.  Sheet cotton cut dorsally.  ACE bandage applied loosely.  The patient tolerated the procedure well.  She was given appropriate medicine for pain, which will be Norco 7.5.  I will see her in the office in 1 week.  If any difficulties, she contact me through the office hospital beeper system.          ______________________________ Shela Commons. Darreld Mclean, M.D.     JWK/MEDQ  D:  03/23/2011  T:  03/24/2011  Job:  [id number]

## 2011-03-30 ENCOUNTER — Encounter (HOSPITAL_COMMUNITY): Payer: Self-pay | Admitting: Orthopaedic Surgery

## 2011-04-04 ENCOUNTER — Other Ambulatory Visit (HOSPITAL_COMMUNITY): Payer: Self-pay | Admitting: Family Medicine

## 2011-04-04 DIAGNOSIS — Z139 Encounter for screening, unspecified: Secondary | ICD-10-CM

## 2011-04-25 ENCOUNTER — Emergency Department (HOSPITAL_COMMUNITY)
Admission: EM | Admit: 2011-04-25 | Discharge: 2011-04-25 | Disposition: A | Discharge status: Home / Self Care | Payer: Medicaid Other | Attending: Emergency Medicine | Admitting: Emergency Medicine

## 2011-04-25 ENCOUNTER — Encounter (HOSPITAL_COMMUNITY): Payer: Self-pay

## 2011-04-25 DIAGNOSIS — K625 Hemorrhage of anus and rectum: Secondary | ICD-10-CM | POA: Insufficient documentation

## 2011-04-25 DIAGNOSIS — I10 Essential (primary) hypertension: Secondary | ICD-10-CM | POA: Insufficient documentation

## 2011-04-25 DIAGNOSIS — Z79899 Other long term (current) drug therapy: Secondary | ICD-10-CM | POA: Insufficient documentation

## 2011-04-25 DIAGNOSIS — L0231 Cutaneous abscess of buttock: Secondary | ICD-10-CM | POA: Insufficient documentation

## 2011-04-25 DIAGNOSIS — R110 Nausea: Secondary | ICD-10-CM | POA: Insufficient documentation

## 2011-04-25 LAB — OCCULT BLOOD, POC DEVICE: Fecal Occult Bld: NEGATIVE

## 2011-04-25 MED ORDER — HYDROCODONE-ACETAMINOPHEN 5-325 MG PO TABS: 2.0000 | ORAL_TABLET | ORAL | Status: AC | PRN

## 2011-04-25 MED ORDER — CLINDAMYCIN HCL 150 MG PO CAPS: 150.0000 mg | ORAL_CAPSULE | Freq: Four times a day (QID) | ORAL | Status: AC

## 2011-04-25 NOTE — ED Provider Notes (Signed)
This chart was scribed for No att. providers found by Williemae Natter. The patient was seen in room [facility] at 8:10 AM.  CSN: [id number]  Arrival date & time 04/25/11  7829   First MD Initiated Contact with Patient 04/25/11 [phone]      Chief Complaint  Patient presents with  . GI Bleeding  . Abscess    (Consider location/radiation/quality/duration/timing/severity/associated sxs/prior treatment) HPI Amber Drake is a 51 y.o. female who presents to the Emergency Department complaining of recurrent painful boils to her perineum. Pt has had surgery and had them lanced 1 year ago. Current episode started about 2 weeks ago and has been gradually worsening. Pt has been taking doxycycline since then but states that there has been no improvement. Pt noted blood in commode this morning. She has been nauseous since this morning and states that her hemorrhoids have been bleeding.   Past Medical History  Diagnosis Date  . Sickle cell trait   . Hypertension     does not take meds  . Blood dyscrasia     sickle cell trait    Past Surgical History  Procedure Date  . Appendectomy   . Incision and drainage perirectal abscess 12/21/09  . Cesarean section     x 2  . Proctoscopy 10/17/2010    Procedure: PROCTOSCOPY;  Surgeon: Marlane Hatcher;  Location: [facility];  Service: General;  Laterality: N/A;  Rigid Proctoscopy/Possible Fistula in Ano  Procedure ended at 1003  . Therapeutic abortion     x2  . Carpal tunnel release 03/23/2011    Procedure: CARPAL TUNNEL RELEASE;  Surgeon: Darreld Mclean;  Location: [facility];  Service: Orthopedics;  Laterality: Left;    Family History  Problem Relation Age of Onset  . Kidney failure Daughter   . Sickle cell anemia Daughter   . Sickle cell anemia Son   . Anesthesia problems Neg Hx   . Hypotension Neg Hx   . Malignant hyperthermia Neg Hx   . Pseudochol deficiency Neg Hx     History  Substance Use Topics  . Smoking status: Current Everyday Smoker  -- 0.5 packs/day for 30 years    Types: Cigarettes  . Smokeless tobacco: Not on file  . Alcohol Use: No    OB History    Grav Para Term Preterm Abortions TAB SAB Ect Mult Living   4 2 2  2            Review of Systems 10 Systems reviewed and are negative for acute change except as noted in the HPI.  Allergies  Penicillins; Penicillins cross reactors; and Tape  Home Medications   Current Outpatient Rx  Name Route Sig Dispense Refill  . DOXYCYCLINE HYCLATE 100 MG PO TABS Oral Take 100 mg by mouth 2 (two) times daily.    . IBUPROFEN 600 MG PO TABS Oral Take 600 mg by mouth every 6 (six) hours as needed. For pain    . LISINOPRIL 10 MG PO TABS Oral Take 10 mg by mouth daily.    . ADULT MULTIVITAMIN W/MINERALS CH Oral Take 1 tablet by mouth daily.    . TRAMADOL HCL 50 MG PO TABS Oral Take 50 mg by mouth every 6 (six) hours as needed. For pain    . CLINDAMYCIN HCL 150 MG PO CAPS Oral Take 1 capsule (150 mg total) by mouth every 6 (six) hours. 28 capsule 0  . HYDROCODONE-ACETAMINOPHEN 5-325 MG PO TABS Oral Take 2 tablets by mouth every 4 (four)  hours as needed for pain. 20 tablet 0  . MEDROXYPROGESTERONE ACETATE 150 MG/ML IM SUSP Intramuscular Inject 150 mg into the muscle every 3 (three) months.      Pulse oximetry on room air is 100%. Normal by my interpretation.  BP 126/95  Pulse 87  Temp(Src) 98.3 F (36.8 C) (Oral)  Resp 20  Ht 5\' 9"  (1.753 m)  Wt 167 lb (75.751 kg)  BMI 24.66 kg/m2  SpO2 100%  Physical Exam  Nursing note and vitals reviewed. Constitutional: She is oriented to person, place, and time. She appears well-developed and well-nourished.  HENT:  Head: Normocephalic and atraumatic.  Eyes: Conjunctivae are normal. Pupils are equal, round, and reactive to light.  Neck: Normal range of motion. Neck supple.  Cardiovascular: Normal rate and normal heart sounds.   Pulmonary/Chest: Effort normal and breath sounds normal.  Abdominal: Soft. She exhibits no mass. There  is no tenderness.       No costal or vertebral tenderness.  Genitourinary:       External vaginal exam is negative for acute abscess. There is old scarring and slight induration of areas of the left lower labial area. The left, buttocks , has deformity with multiple areas that have previously been incised. Also, lateral to the anus by 3 cm is a pinpoint area of drainage without associated fluctuance or swelling, as would be consistent with a very small abscess. Rectal examination is normal without palpable internal hemorrhoids and no visible stool on the examining finger. Hemoccult testing is negative for blood  Neurological: She is alert and oriented to person, place, and time.  Skin: Skin is warm and dry.  Psychiatric: She has a normal mood and affect. Her behavior is normal. Judgment and thought content normal.    ED Course  Procedures (including critical care time)   Labs Reviewed  OCCULT BLOOD, POC DEVICE  LAB REPORT - SCANNED   No results found.   1. Abscess of buttock       MDM  Perineal pain with apparently resolving skin infections. Is currently one small area of drainage that can be treated symptomatically. The patient does not need additional antibiotics at this time.  Plan: Symptomatic treatment with daily bathing x3 and observation. Prescription for Norco given    Afsheen, Antony  Home Medication Instructions [id number]   Printed on:04/25/11 0915  Medication Information                    doxycycline (VIBRA-TABS) 100 MG tablet Take 100 mg by mouth 2 (two) times daily.           ibuprofen (ADVIL,MOTRIN) 600 MG tablet Take 600 mg by mouth every 6 (six) hours as needed. For pain           lisinopril (PRINIVIL,ZESTRIL) 10 MG tablet Take 10 mg by mouth daily.           Multiple Vitamin (MULITIVITAMIN WITH MINERALS) TABS Take 1 tablet by mouth daily.           traMADol (ULTRAM) 50 MG tablet Take 50 mg by mouth every 6 (six) hours as needed. For pain            HYDROcodone-acetaminophen (NORCO) 5-325 MG per tablet Take 2 tablets by mouth every 4 (four) hours as needed for pain.           medroxyPROGESTERone (DEPO-PROVERA) 150 MG/ML injection Inject 150 mg into the muscle every 3 (three) months.  I personally performed the services described in this documentation, which was scribed in my presence. The recorded information has been reviewed and considered.     Flint Melter, MD 04/28/11 [phone]

## 2011-04-25 NOTE — ED Notes (Signed)
Pt c/o abscesses to her left labia. No redness or swelling noted. Pt also states that her hemorrhoids were bleeding this am. Alert and oriented x 3. Skin warm and dry. Color pink. Lying comfortably on stretcher. Family at bedside.

## 2011-04-25 NOTE — ED Notes (Signed)
Pt reports boils to groin and abd, reports went to urgent care 2 weeks ago and was put on doxycycline  But says isn't helping.  Also reports feeling nauseated today and noticed dark red blood in commode after having a bm.

## 2011-05-04 ENCOUNTER — Encounter (HOSPITAL_COMMUNITY): Payer: Self-pay | Admitting: Pharmacy Technician

## 2011-05-07 ENCOUNTER — Other Ambulatory Visit: Payer: Self-pay

## 2011-05-07 ENCOUNTER — Ambulatory Visit (HOSPITAL_COMMUNITY): Payer: Medicaid Other

## 2011-05-07 ENCOUNTER — Encounter (HOSPITAL_COMMUNITY)
Admission: RE | Admit: 2011-05-07 | Discharge: 2011-05-07 | Disposition: A | Discharge status: Home / Self Care | Payer: Medicaid Other | Source: Ambulatory Visit | Attending: Orthopaedic Surgery | Admitting: Orthopaedic Surgery

## 2011-05-07 ENCOUNTER — Encounter (HOSPITAL_COMMUNITY): Payer: Self-pay

## 2011-05-07 LAB — DIFFERENTIAL
Basophils Absolute: 0 10*3/uL (ref 0.0–0.1)
Eosinophils Absolute: 0 10*3/uL (ref 0.0–0.7)
Eosinophils Relative: 1 % (ref 0–5)
Monocytes Absolute: 0.2 10*3/uL (ref 0.1–1.0)

## 2011-05-07 LAB — COMPREHENSIVE METABOLIC PANEL
ALT: 8 U/L (ref 0–35)
AST: 13 U/L (ref 0–37)
CO2: 26 mEq/L (ref 19–32)
Calcium: 10.1 mg/dL (ref 8.4–10.5)
Creatinine, Ser: 0.71 mg/dL (ref 0.50–1.10)
GFR calc Af Amer: >90 mL/min (ref >90)
GFR calc non Af Amer: >90 mL/min (ref >90)
Sodium: 140 mEq/L (ref 135–145)
Total Protein: 7.3 g/dL (ref 6.0–8.3)

## 2011-05-07 LAB — CBC
HCT: 32.6 % — ABNORMAL LOW (ref 36.0–46.0)
MCH: 28 pg (ref 26.0–34.0)
MCHC: 33.4 g/dL (ref 30.0–36.0)
MCV: 83.8 fL (ref 78.0–100.0)
Platelets: 371 10*3/uL (ref 150–400)
RDW: 12.8 % (ref 11.5–15.5)

## 2011-05-07 NOTE — Patient Instructions (Addendum)
[address] [facility]  05/07/2011   Your procedure is scheduled on:  05/10/2011  Report to [facility] at 0840 AM.  Call this number if you have problems the morning of surgery: [phone]   Remember:   Do not eat food:After Midnight.  May have clear liquids:until Midnight .  Clear liquids include soda, tea, black coffee, apple or grape juice, broth.  Take these medicines the morning of surgery with A SIP OF WATER: Lisinopril. Take your Tramadol only if needed.   Do not wear jewelry, make-up or nail polish.  Do not wear lotions, powders, or perfumes. You may wear deodorant.  Do not shave 48 hours prior to surgery.  Do not bring valuables to the hospital.  Contacts, dentures or bridgework may not be worn into surgery.  Leave suitcase in the car. After surgery it may be brought to your room.  For patients admitted to the hospital, checkout time is 11:00 AM the day of discharge.   Patients discharged the day of surgery will not be allowed to drive home.  Name and phone number of your driver: Family  Special Instructions: CHG Shower Use Special Wash: 1/2 bottle night before surgery and 1/2 bottle morning of surgery.   Please read over the following fact sheets that you were given: Pain Booklet, MRSA Information, Surgical Site Infection Prevention, Anesthesia Post-op Instructions and Care and Recovery After Surgery        Carpal Tunnel Release (Repair) Care After Refer to this sheet in the next few weeks. These discharge instructions provide you with general information on caring for yourself after you leave the hospital. Your caregiver may also give you specific instructions. Your treatment has been planned according to the most current medical practices available, but unavoidable complications sometimes occur. If you have any problems or questions after discharge, please call your caregiver. HOME CARE INSTRUCTIONS   Have a responsible person with you for 24 hours.   Do not drive a car  or take public transportation for 24 hours.   Only take over-the-counter or prescription medicines for pain, discomfort, or fever as directed by your caregiver. Take them as directed.   You may put ice on the palm side of the affected wrist.   Put ice in a plastic bag.   Place a towel between your skin and the bag.   Leave the ice on for 15 to 20 minutes, 3 to 4 times per day.   If you were given a splint to keep your wrist from bending, use it as directed. It is important to wear the splint at night or as directed. Use the splint for as long as you have pain or numbness in your hand, arm, or wrist. This may take 1 to 2 months.   Keep your hand raised (elevated) above the level of your heart as much as possible. This keeps swelling down and helps with discomfort.   Change bandages (dressings) as directed.   Keep the wound clean and dry.  SEEK MEDICAL CARE IF:   You develop pain not relieved with medicines.   You develop numbness of your hand.   You develop bleeding from your surgical site.   You have an oral temperature above 102 F (38.9 C).   You develop redness or swelling of the surgical site.   You develop new, unexplained problems.  SEEK IMMEDIATE MEDICAL CARE IF:   You develop a rash.   You have difficulty breathing.   You develop any reaction or  side effects to medicines given.  MAKE SURE YOU:   Understand these instructions.   Will watch your condition.   Will get help right away if you are not doing well or get worse.  Document Released: 10/06/2004 Document Revised: 11/29/2010 Document Reviewed: 01/23/2007 [facility] Patient Information 2012 [place], [place].   PATIENT INSTRUCTIONS POST-ANESTHESIA  IMMEDIATELY FOLLOWING SURGERY:  Do not drive or operate machinery for the first twenty four hours after surgery.  Do not make any important decisions for twenty four hours after surgery or while taking narcotic pain medications or sedatives.  If you develop  intractable nausea and vomiting or a severe headache please notify your doctor immediately.  FOLLOW-UP:  Please make an appointment with your surgeon as instructed. You do not need to follow up with anesthesia unless specifically instructed to do so.  WOUND CARE INSTRUCTIONS (if applicable):  Keep a dry clean dressing on the anesthesia/puncture wound site if there is drainage.  Once the wound has quit draining you may leave it open to air.  Generally you should leave the bandage intact for twenty four hours unless there is drainage.  If the epidural site drains for more than 36-48 hours please call the anesthesia department.  QUESTIONS?:  Please feel free to call your physician or the hospital operator if you have any questions, and they will be happy to assist you.     [facility] Anesthesia Department [address] [place] [phone]

## 2011-05-09 NOTE — H&P (Signed)
Amber Drake is an 51 y.o. female.   Chief Complaint: Carpal tunnel right  HPI: She had left carpal tunnel surgery in December, 2012.  She had prior positive EMG showing bilateral carpal tunnel.  She has done well with the left hand surgery.  She has night paresthesias, pain not relieved with rest, splint on the right now.  She is scheduled now for right carpal tunnel surgery.  She understands the risks and imponderables.  Past Medical History  Diagnosis Date  . Sickle cell trait   . Hypertension     does not take meds  . Blood dyscrasia     sickle cell trait    Past Surgical History  Procedure Date  . Appendectomy   . Incision and drainage perirectal abscess 12/21/09  . Cesarean section     x 2  . Proctoscopy 10/17/2010    Procedure: PROCTOSCOPY;  Surgeon: Marlane Hatcher;  Location: [facility];  Service: General;  Laterality: N/A;  Rigid Proctoscopy/Possible Fistula in Ano  Procedure ended at 1003  . Therapeutic abortion     x2  . Carpal tunnel release 03/23/2011    Procedure: CARPAL TUNNEL RELEASE;  Surgeon: Darreld Mclean;  Location: [facility];  Service: Orthopedics;  Laterality: Left;    Family History  Problem Relation Age of Onset  . Kidney failure Daughter   . Sickle cell anemia Daughter   . Sickle cell anemia Son   . Anesthesia problems Neg Hx   . Hypotension Neg Hx   . Malignant hyperthermia Neg Hx   . Pseudochol deficiency Neg Hx    Social History:  reports that she has been smoking Cigarettes.  She has a 15 pack-year smoking history. She does not have any smokeless tobacco history on file. She reports that she does not drink alcohol or use illicit drugs.  Allergies:  Allergies  Allergen Reactions  . Penicillins Anaphylaxis and Hives  . Penicillins Cross Reactors Itching and Swelling  . Tape Itching    No current facility-administered medications on file as of .   Medications Prior to Admission  Medication Sig Dispense Refill  . Multiple Vitamin  (MULITIVITAMIN WITH MINERALS) TABS Take 1 tablet by mouth daily.      . clindamycin (CLEOCIN) 150 MG capsule Take 1 capsule (150 mg total) by mouth every 6 (six) hours.  28 capsule  0  . HYDROcodone-acetaminophen (NORCO) 5-325 MG per tablet Take 2 tablets by mouth every 4 (four) hours as needed for pain.  20 tablet  0  . ibuprofen (ADVIL,MOTRIN) 600 MG tablet Take 600 mg by mouth every 6 (six) hours as needed. For pain      . lisinopril (PRINIVIL,ZESTRIL) 10 MG tablet Take 10 mg by mouth daily.      . medroxyPROGESTERone (DEPO-PROVERA) 150 MG/ML injection Inject 150 mg into the muscle every 3 (three) months.        . traMADol (ULTRAM) 50 MG tablet Take 50 mg by mouth every 6 (six) hours as needed. For pain        No results found for this or any previous visit (from the past 48 hour(s)). No results found.  Review of Systems  Constitutional: Negative.   HENT: Negative.   Eyes: Negative.   Respiratory: Negative.   Cardiovascular: Negative.   Gastrointestinal: Negative.   Genitourinary: Negative.   Musculoskeletal: Positive for joint pain (pain right hand with night paresthesias of median nerve distribution.).  Skin: Negative.   Neurological: Negative.   Endo/Heme/Allergies: Negative.  Psychiatric/Behavioral: Negative.     There were no vitals taken for this visit. Physical Exam  Constitutional: She is oriented to person, place, and time. She appears well-developed and well-nourished.  HENT:  Head: Normocephalic and atraumatic.  Eyes: Conjunctivae and EOM are normal. Pupils are equal, round, and reactive to light.  Neck: Normal range of motion. Neck supple.  Cardiovascular: Normal rate, regular rhythm, normal heart sounds and intact distal pulses.   Respiratory: Effort normal and breath sounds normal.  GI: Soft. Bowel sounds are normal.  Musculoskeletal: She exhibits tenderness (right hand tenderness with positive Phalen and Tinel of the wrist median nerve).       Right hand: She  exhibits tenderness. decreased sensation noted. Decreased sensation is present in the medial distribution.       Hands: Neurological: She is alert and oriented to person, place, and time.  Skin: Skin is warm and dry.  Psychiatric: She has a normal mood and affect. Her behavior is normal. Judgment and thought content normal.     Assessment/Plan Carpal Tunnel Syndrome, Right. Post carpal tunnel surgery left.  Now for surgery of the right wrist for carpal tunnel.  Ebony Rickel 05/09/2011, 2:46 PM

## 2011-05-10 ENCOUNTER — Encounter (HOSPITAL_COMMUNITY)
Admission: RE | Disposition: A | Discharge status: Home / Self Care | Payer: Self-pay | Source: Ambulatory Visit | Attending: Orthopaedic Surgery

## 2011-05-10 ENCOUNTER — Encounter (HOSPITAL_COMMUNITY): Payer: Self-pay | Admitting: *Deleted

## 2011-05-10 ENCOUNTER — Ambulatory Visit (HOSPITAL_COMMUNITY): Payer: Medicaid Other | Admitting: Anesthesiology

## 2011-05-10 ENCOUNTER — Ambulatory Visit (HOSPITAL_COMMUNITY)
Admission: RE | Admit: 2011-05-10 | Discharge: 2011-05-10 | Disposition: A | Discharge status: Home / Self Care | Payer: Medicaid Other | Source: Ambulatory Visit | Attending: Orthopaedic Surgery | Admitting: Orthopaedic Surgery

## 2011-05-10 ENCOUNTER — Encounter (HOSPITAL_COMMUNITY): Payer: Self-pay | Admitting: Anesthesiology

## 2011-05-10 ENCOUNTER — Other Ambulatory Visit: Payer: Self-pay | Admitting: Orthopaedic Surgery

## 2011-05-10 DIAGNOSIS — G5600 Carpal tunnel syndrome, unspecified upper limb: Secondary | ICD-10-CM | POA: Insufficient documentation

## 2011-05-10 DIAGNOSIS — I10 Essential (primary) hypertension: Secondary | ICD-10-CM | POA: Insufficient documentation

## 2011-05-10 DIAGNOSIS — Z01812 Encounter for preprocedural laboratory examination: Secondary | ICD-10-CM | POA: Insufficient documentation

## 2011-05-10 DIAGNOSIS — Z01810 Encounter for preprocedural cardiovascular examination: Secondary | ICD-10-CM | POA: Insufficient documentation

## 2011-05-10 HISTORY — PX: CARPAL TUNNEL RELEASE: SHX101

## 2011-05-10 LAB — URINALYSIS, ROUTINE W REFLEX MICROSCOPIC
Glucose, UA: NEGATIVE mg/dL
Leukocytes, UA: NEGATIVE
Protein, ur: NEGATIVE mg/dL
Specific Gravity, Urine: 1.02 (ref 1.005–1.030)
Urobilinogen, UA: 0.2 mg/dL (ref 0.0–1.0)

## 2011-05-10 LAB — URINE MICROSCOPIC-ADD ON

## 2011-05-10 SURGERY — CARPAL TUNNEL RELEASE
Anesthesia: Regional | Site: Wrist | Laterality: Right | Wound class: Clean

## 2011-05-10 MED ORDER — MIDAZOLAM HCL 2 MG/2ML IJ SOLN
INTRAMUSCULAR | Status: AC
  Administered 2011-05-10: 2 mg via INTRAVENOUS
  Filled 2011-05-10: qty 2

## 2011-05-10 MED ORDER — FENTANYL CITRATE 0.05 MG/ML IJ SOLN
INTRAMUSCULAR | Status: AC
  Filled 2011-05-10: qty 2

## 2011-05-10 MED ORDER — FENTANYL CITRATE 0.05 MG/ML IJ SOLN
25.0000 ug | INTRAMUSCULAR | Status: DC | PRN
  Administered 2011-05-10 (×2): 25 ug via INTRAVENOUS

## 2011-05-10 MED ORDER — LACTATED RINGERS IV SOLN
INTRAVENOUS | Status: DC
  Administered 2011-05-10: 1000 mL via INTRAVENOUS

## 2011-05-10 MED ORDER — ONDANSETRON HCL 4 MG/2ML IJ SOLN: 4.0000 mg | Freq: Once | INTRAMUSCULAR | Status: AC | PRN

## 2011-05-10 MED ORDER — FENTANYL CITRATE 0.05 MG/ML IJ SOLN
25.0000 ug | INTRAMUSCULAR | Status: DC | PRN
  Administered 2011-05-10 (×2): 50 ug via INTRAVENOUS

## 2011-05-10 MED ORDER — FENTANYL CITRATE 0.05 MG/ML IJ SOLN
INTRAMUSCULAR | Status: AC
  Administered 2011-05-10: 25 ug via INTRAVENOUS
  Filled 2011-05-10: qty 2

## 2011-05-10 MED ORDER — LIDOCAINE HCL (PF) 0.5 % IJ SOLN
INTRAMUSCULAR | Status: AC
  Filled 2011-05-10: qty 50

## 2011-05-10 MED ORDER — MIDAZOLAM HCL 2 MG/2ML IJ SOLN
1.0000 mg | INTRAMUSCULAR | Status: DC | PRN
  Administered 2011-05-10: 2 mg via INTRAVENOUS

## 2011-05-10 MED ORDER — SODIUM CHLORIDE 0.9 % IR SOLN
Status: DC | PRN
  Administered 2011-05-10: 1000 mL

## 2011-05-10 MED ORDER — FENTANYL CITRATE 0.05 MG/ML IJ SOLN
INTRAMUSCULAR | Status: DC | PRN
  Administered 2011-05-10 (×2): 50 ug via INTRAVENOUS

## 2011-05-10 MED ORDER — LIDOCAINE HCL (PF) 1 % IJ SOLN
INTRAMUSCULAR | Status: AC
  Filled 2011-05-10: qty 5

## 2011-05-10 MED ORDER — LACTATED RINGERS IV SOLN
INTRAVENOUS | Status: DC | PRN
  Administered 2011-05-10: 10:00:00 via INTRAVENOUS

## 2011-05-10 MED ORDER — PROPOFOL 10 MG/ML IV EMUL
INTRAVENOUS | Status: AC
  Filled 2011-05-10: qty 20

## 2011-05-10 MED ORDER — MIDAZOLAM HCL 2 MG/2ML IJ SOLN
INTRAMUSCULAR | Status: AC
  Filled 2011-05-10: qty 2

## 2011-05-10 MED ORDER — LIDOCAINE HCL (CARDIAC) 10 MG/ML IV SOLN
INTRAVENOUS | Status: DC | PRN
  Administered 2011-05-10: 50 mg via INTRAVENOUS

## 2011-05-10 MED ORDER — FENTANYL CITRATE 0.05 MG/ML IJ SOLN
INTRAMUSCULAR | Status: AC
  Administered 2011-05-10: 50 ug via INTRAVENOUS
  Filled 2011-05-10: qty 2

## 2011-05-10 MED ORDER — SODIUM CHLORIDE 0.9 % IJ SOLN
INTRAMUSCULAR | Status: DC | PRN
  Administered 2011-05-10: 1.5 mL via INTRAVENOUS

## 2011-05-10 MED ORDER — MIDAZOLAM HCL 5 MG/5ML IJ SOLN
INTRAMUSCULAR | Status: DC | PRN
  Administered 2011-05-10: 2 mg via INTRAVENOUS

## 2011-05-10 MED ORDER — PROPOFOL 10 MG/ML IV EMUL
INTRAVENOUS | Status: DC | PRN
  Administered 2011-05-10: 50 ug/kg/min via INTRAVENOUS

## 2011-05-10 SURGICAL SUPPLY — 36 items
BAG HAMPER (MISCELLANEOUS) ×2 IMPLANT
BANDAGE ELASTIC 3 VELCRO NS (GAUZE/BANDAGES/DRESSINGS) ×2 IMPLANT
BANDAGE ESMARK 4X12 BL STRL LF (DISPOSABLE) ×1 IMPLANT
BLADE SURG 15 STRL LF DISP TIS (BLADE) ×1 IMPLANT
BLADE SURG 15 STRL SS (BLADE) ×2
BNDG CMPR 12X4 ELC STRL LF (DISPOSABLE) ×1
BNDG ESMARK 4X12 BLUE STRL LF (DISPOSABLE) ×2
CLOTH BEACON ORANGE TIMEOUT ST (SAFETY) ×2 IMPLANT
COVER LIGHT HANDLE STERIS (MISCELLANEOUS) ×4 IMPLANT
CUFF TOURNIQUET SINGLE 18IN (TOURNIQUET CUFF) ×2 IMPLANT
DRSG XEROFORM 1X8 (GAUZE/BANDAGES/DRESSINGS) IMPLANT
ELECT NDL TIP 2.8 STRL (NEEDLE) IMPLANT
ELECT NEEDLE TIP 2.8 STRL (NEEDLE) ×2 IMPLANT
ELECT REM PT RETURN 9FT ADLT (ELECTROSURGICAL) ×2
ELECTRODE REM PT RTRN 9FT ADLT (ELECTROSURGICAL) ×1 IMPLANT
FORMALIN 10 PREFIL 120ML (MISCELLANEOUS) ×2 IMPLANT
GLOVE BIO SURGEON STRL SZ8 (GLOVE) ×2 IMPLANT
GLOVE BIO SURGEON STRL SZ8.5 (GLOVE) ×2 IMPLANT
GOWN STRL REIN XL XLG (GOWN DISPOSABLE) ×4 IMPLANT
KIT ROOM TURNOVER APOR (KITS) ×2 IMPLANT
NDL HYPO 18GX1.5 BLUNT FILL (NEEDLE) ×1 IMPLANT
NDL HYPO 27GX1-1/4 (NEEDLE) ×1 IMPLANT
NEEDLE HYPO 18GX1.5 BLUNT FILL (NEEDLE) ×2 IMPLANT
NEEDLE HYPO 27GX1-1/4 (NEEDLE) ×2 IMPLANT
NS IRRIG 1000ML POUR BTL (IV SOLUTION) ×2 IMPLANT
PACK BASIC LIMB (CUSTOM PROCEDURE TRAY) ×2 IMPLANT
PAD ARMBOARD 7.5X6 YLW CONV (MISCELLANEOUS) ×2 IMPLANT
PAD CAST 3X4 CTTN HI CHSV (CAST SUPPLIES) ×1 IMPLANT
PADDING CAST COTTON 3X4 STRL (CAST SUPPLIES) ×2
SET BASIN LINEN APH (SET/KITS/TRAYS/PACK) ×2 IMPLANT
SOL PREP PROV IODINE SCRUB 4OZ (MISCELLANEOUS) ×2 IMPLANT
SPONGE GAUZE 4X4 12PLY (GAUZE/BANDAGES/DRESSINGS) ×2 IMPLANT
SUT ETHILON 3 0 FSL (SUTURE) ×2 IMPLANT
SYR 3ML LL SCALE MARK (SYRINGE) ×2 IMPLANT
TOWEL OR 17X26 4PK STRL BLUE (TOWEL DISPOSABLE) ×2 IMPLANT
VESSEL LOOPS MAXI RED (MISCELLANEOUS) ×2 IMPLANT

## 2011-05-10 NOTE — Transfer of Care (Signed)
Immediate Anesthesia Transfer of Care Note  Patient: Amber Drake  Procedure(s) Performed:  CARPAL TUNNEL RELEASE  Patient Location: PACU  Anesthesia Type: General  Level of Consciousness: awake, alert , oriented and patient cooperative  Airway & Oxygen Therapy: Patient Spontanous Breathing  Post-op Assessment: Report given to PACU RN, Post -op Vital signs reviewed and stable and Patient moving all extremities  Post vital signs: Reviewed and stable  Complications: No apparent anesthesia complications

## 2011-05-10 NOTE — Anesthesia Procedure Notes (Signed)
Anesthesia Regional Block:  Bier block (IV Regional)  Pre-Anesthetic Checklist: ,, timeout performed, Correct Patient, Correct Site, Correct Laterality, Correct Procedure, Correct Position, site marked, Risks and benefits discussed,  Surgical consent,  Pre-op evaluation,  At surgeon's request and post-op pain management  Laterality: Right     Bier block (IV Regional) Narrative:  Start time: 05/10/2011 11:35 AM End time: 05/10/2011 11:36 AM  Performed by: Other  Resident/CRNA: Corena Pilgrim, CRNA  Additional Notes: 1134 Esmark to Right arm; Tourniquet inflated to 250 mmhg ; neg. Right radial pulse; .5% Lidocaine 50 cc injected via IV catheter to right hand;  Patient tolerated well.

## 2011-05-10 NOTE — Progress Notes (Signed)
Pt discharged with arm elevated in sling with ice pack.

## 2011-05-10 NOTE — Preoperative (Signed)
Beta Blockers   Reason not to administer Beta Blockers:Not Applicable 

## 2011-05-10 NOTE — Anesthesia Preprocedure Evaluation (Signed)
Anesthesia Evaluation  Patient identified by MRN, date of birth, ID band Patient awake    Reviewed: Allergy & Precautions, H&P , Patient's Chart, lab work & pertinent test results  History of Anesthesia Complications Negative for: history of anesthetic complications  Airway Mallampati: II TM Distance: >3 FB Neck ROM: Full    Dental  (+) Edentulous Upper   Pulmonary    Pulmonary exam normal       Cardiovascular hypertension, Regular Normal    Neuro/Psych    GI/Hepatic   Endo/Other    Renal/GU      Musculoskeletal   Abdominal   Peds  Hematology  (+) Blood dyscrasia, Sickle cell trait ,   Anesthesia Other Findings   Reproductive/Obstetrics                           Anesthesia Physical Anesthesia Plan  ASA: II  Anesthesia Plan: Bier Block   Post-op Pain Management:    Induction: Intravenous  Airway Management Planned: Nasal Cannula  Additional Equipment:   Intra-op Plan:   Post-operative Plan:   Informed Consent: I have reviewed the patients History and Physical, chart, labs and discussed the procedure including the risks, benefits and alternatives for the proposed anesthesia with the patient or authorized representative who has indicated his/her understanding and acceptance.     Plan Discussed with:   Anesthesia Plan Comments:         Anesthesia Quick Evaluation

## 2011-05-10 NOTE — Progress Notes (Signed)
The History and Physical is unchanged. I have examined the patient. The patient is medically able to have surgery on the right wrist . Amber Drake  

## 2011-05-10 NOTE — Brief Op Note (Signed)
05/10/2011  12:04 PM  PATIENT:  Amber Drake  51 y.o. female  PRE-OPERATIVE DIAGNOSIS:  carpal tunnel syndrome right wrist  POST-OPERATIVE DIAGNOSIS:  carpal tunnel syndrome right wrist  PROCEDURE:  Procedure(s): CARPAL TUNNEL RELEASE right  SURGEON:  Surgeon(s): Darreld Mclean, MD  PHYSICIAN ASSISTANT:   ASSISTANTS: none   ANESTHESIA:   none  EBL:  Total I/O In: 700 [I.V.:700] Out: -   BLOOD ADMINISTERED:none  DRAINS: none   LOCAL MEDICATIONS USED:  NONE  SPECIMEN:  Source of Specimen:  right volar carpal ligament  DISPOSITION OF SPECIMEN:  PATHOLOGY  COUNTS:  YES  TOURNIQUET:  * Missing tourniquet times found for documented tourniquets in log:  22148 *  DICTATION: .Other Dictation: Dictation Number [id number]  PLAN OF CARE: Discharge to home after PACU  PATIENT DISPOSITION:  PACU - hemodynamically stable.   Delay start of Pharmacological VTE agent (>24hrs) due to surgical blood loss or risk of bleeding:  {YES/NO/NOT APPLICABLE:20182

## 2011-05-10 NOTE — Anesthesia Postprocedure Evaluation (Signed)
  Anesthesia Post-op Note  Patient: Amber Drake  Procedure(s) Performed:  CARPAL TUNNEL RELEASE  Patient Location: PACU  Anesthesia Type: General  Level of Consciousness: awake, alert , oriented and patient cooperative  Airway and Oxygen Therapy: Patient Spontanous Breathing  Post-op Pain: mild  Post-op Assessment: Post-op Vital signs reviewed, Patient's Cardiovascular Status Stable, Respiratory Function Stable and No signs of Nausea or vomiting  Post-op Vital Signs: Reviewed and stable  Complications: No apparent anesthesia complications

## 2011-05-11 NOTE — Op Note (Signed)
NAMESUZANNAH, BETTES              ACCOUNT NO.:  [id number]  MEDICAL RECORD NO.:  [id number]  LOCATION:  [facility]                          FACILITY:  [facility]  PHYSICIAN:  J. Darreld Mclean, M.D. DATE OF BIRTH:  1961-03-04  DATE OF PROCEDURE: DATE OF DISCHARGE:                              OPERATIVE REPORT   PREOPERATIVE DIAGNOSIS:  Carpal tunnel syndrome, right.  POSTOPERATIVE DIAGNOSIS:  Carpal tunnel syndrome, right.  PROCEDURE:  Release of volar carpal ligament, saline neurolysis, epineurotomy of right median nerve.  ANESTHESIA:  Bier block.  Please refer Anesthesia record for tourniquet time.  SURGEON:  J. Darreld Mclean, MD  DRAINS:  No drains.  INDICATIONS:  The patient is a 51 year old female who has carpal tunnel syndrome proven by EMGs.  She has had pain at rest, night paresthesias. No help with splinting, anti-inflammatories, or rest.  She had a carpal tunnel syndrome on the left in December, now is for carpal tunnel syndrome surgery on the right.  The patient understands the procedure, the risks and imponderables.  She has elected to have this elective surgery done today.  DESCRIPTION OF PROCEDURE:  The patient was seen in the holding area, identified the right hand as correct surgical site.  I placed a mark on the right wrist.  The patient was brought to the OR and placed supine on the operating room table.  Bier block anesthesia was given.  She was prepped and draped in the usual manner.  A generalized time-out identifying the patient as Ms. Mardella Layman, we were doing the right hand for carpal tunnel syndrome.  Operating room team knew each other.  All instrumentation properly positioned and working. Careful dissection incision was made and the median nerve identified proximally.  A vessel loop was placed around the nerve.  A grooved director was then placed in the carpal tunnel space and the volar carpal ligament was incised.  It was apparent that the patient had  compression of the nerve.  Retinaculum was cut proximally.  Saline neurolysis epineurotomy was carried out of the nerve.  Specimen of volar carpal ligament was sent to pathology.  Nerve was inspected.  No apparent injury.  Wounds were reapproximated using 3-0 nylon interrupted vertical mattress manner.  Hand was cleansed with Betadine, and a sterile dressing was applied.  Sheet cotton applied and sheet cotton was cut dorsally.  ACE bandage applied loosely.  The patient tolerated the procedure well.  She will go back to recovery in good condition. Appropriate analgesic will be given for pain.  I will see her in the office in 1 week.  If any difficulties, contact me through the office hospital beeper system.          ______________________________ Shela Commons. Darreld Mclean, M.D.     JWK/MEDQ  D:  05/10/2011  T:  05/11/2011  Job:  [id number]

## 2011-05-14 ENCOUNTER — Encounter (HOSPITAL_COMMUNITY): Payer: Self-pay | Admitting: Orthopaedic Surgery

## 2011-06-30 ENCOUNTER — Emergency Department (HOSPITAL_COMMUNITY)
Admission: EM | Admit: 2011-06-30 | Discharge: 2011-06-30 | Disposition: A | Discharge status: Home / Self Care | Payer: Medicaid Other | Attending: Emergency Medicine | Admitting: Emergency Medicine

## 2011-06-30 ENCOUNTER — Encounter (HOSPITAL_COMMUNITY): Payer: Self-pay | Admitting: *Deleted

## 2011-06-30 DIAGNOSIS — M79609 Pain in unspecified limb: Secondary | ICD-10-CM | POA: Insufficient documentation

## 2011-06-30 DIAGNOSIS — Z79899 Other long term (current) drug therapy: Secondary | ICD-10-CM | POA: Insufficient documentation

## 2011-06-30 DIAGNOSIS — IMO0001 Reserved for inherently not codable concepts without codable children: Secondary | ICD-10-CM | POA: Insufficient documentation

## 2011-06-30 DIAGNOSIS — M79641 Pain in right hand: Secondary | ICD-10-CM

## 2011-06-30 DIAGNOSIS — R209 Unspecified disturbances of skin sensation: Secondary | ICD-10-CM | POA: Insufficient documentation

## 2011-06-30 DIAGNOSIS — M25439 Effusion, unspecified wrist: Secondary | ICD-10-CM | POA: Insufficient documentation

## 2011-06-30 DIAGNOSIS — M25539 Pain in unspecified wrist: Secondary | ICD-10-CM | POA: Insufficient documentation

## 2011-06-30 DIAGNOSIS — I10 Essential (primary) hypertension: Secondary | ICD-10-CM | POA: Insufficient documentation

## 2011-06-30 DIAGNOSIS — R609 Edema, unspecified: Secondary | ICD-10-CM | POA: Insufficient documentation

## 2011-06-30 MED ORDER — OXYCODONE-ACETAMINOPHEN 5-325 MG PO TABS: 1.0000 | ORAL_TABLET | ORAL | Status: AC | PRN

## 2011-06-30 NOTE — ED Provider Notes (Signed)
History     CSN: [id number]  Arrival date & time 06/30/11  1615   First MD Initiated Contact with Patient 06/30/11 1624      Chief Complaint  Patient presents with  . Hand Pain    (Consider location/radiation/quality/duration/timing/severity/associated sxs/prior treatment) HPI Comments: Patient c/o pain and swelling to her right wrist for several days.  States the pain has been persistent since having carpal tunnel surgery in January.  States that when she uses her hand, it will begin to swell.  Pain improves somewhat with rest.  She also reports occasional tingling.  She denies numbness of her hand or fingers.  She also denies recent injury.  She states the hydrocodone that she was prescribed at the time of her surgery is not helping control the pain.  Patient is a 51 y.o. female presenting with hand pain. The history is provided by the patient. No language interpreter was used.  Hand Pain This is a chronic problem. The current episode started more than 1 month ago. The problem occurs constantly. The problem has been unchanged. Associated symptoms include arthralgias, joint swelling and myalgias. Pertinent negatives include no fever, headaches, numbness, rash, swollen glands, vomiting or weakness. Exacerbated by: Movement and palpation. She has tried NSAIDs and acetaminophen for the symptoms. The treatment provided no relief.    Past Medical History  Diagnosis Date  . Sickle cell trait   . Hypertension     does not take meds  . Blood dyscrasia     sickle cell trait    Past Surgical History  Procedure Date  . Appendectomy   . Incision and drainage perirectal abscess 12/21/09  . Cesarean section     x 2  . Proctoscopy 10/17/2010    Procedure: PROCTOSCOPY;  Surgeon: Marlane Hatcher;  Location: [facility];  Service: General;  Laterality: N/A;  Rigid Proctoscopy/Possible Fistula in Ano  Procedure ended at 1003  . Therapeutic abortion     x2  . Carpal tunnel release 03/23/2011   Procedure: CARPAL TUNNEL RELEASE;  Surgeon: Darreld Mclean;  Location: [facility];  Service: Orthopedics;  Laterality: Left;  . Carpal tunnel release 05/10/2011    Procedure: CARPAL TUNNEL RELEASE;  Surgeon: Darreld Mclean, MD;  Location: [facility];  Service: Orthopedics;  Laterality: Right;    Family History  Problem Relation Age of Onset  . Kidney failure Daughter   . Sickle cell anemia Daughter   . Sickle cell anemia Son   . Anesthesia problems Neg Hx   . Hypotension Neg Hx   . Malignant hyperthermia Neg Hx   . Pseudochol deficiency Neg Hx     History  Substance Use Topics  . Smoking status: Current Everyday Smoker -- 0.5 packs/day for 30 years    Types: Cigarettes  . Smokeless tobacco: Not on file  . Alcohol Use: No     one year and three months    OB History    Grav Para Term Preterm Abortions TAB SAB Ect Mult Living   4 2 2  2            Review of Systems  Constitutional: Negative for fever.  Gastrointestinal: Negative for vomiting.  Musculoskeletal: Positive for myalgias, joint swelling and arthralgias. Negative for back pain and gait problem.  Skin: Negative.  Negative for rash.  Neurological: Negative for dizziness, weakness, numbness and headaches.  All other systems reviewed and are negative.    Allergies  Penicillins; Penicillins cross reactors; and Tape  Home Medications  Current Outpatient Rx  Name Route Sig Dispense Refill  . HYDROCODONE-ACETAMINOPHEN 7.5-325 MG PO TABS Oral Take 1 tablet by mouth every 6 (six) hours as needed.    . IBUPROFEN 600 MG PO TABS Oral Take 600 mg by mouth every 6 (six) hours as needed. For pain    . LISINOPRIL 10 MG PO TABS Oral Take 10 mg by mouth daily.    Marland Kitchen MEDROXYPROGESTERONE ACETATE 150 MG/ML IM SUSP Intramuscular Inject 150 mg into the muscle every 3 (three) months.      . ADULT MULTIVITAMIN W/MINERALS CH Oral Take 1 tablet by mouth daily.      BP 148/98  Pulse 84  Temp(Src) 98.6 F (37 C) (Oral)  Resp 20  Ht 5\' 4"   (1.626 m)  Wt 162 lb (73.483 kg)  BMI 27.81 kg/m2  SpO2 100%  Physical Exam  Nursing note and vitals reviewed. Constitutional: She appears well-developed and well-nourished. No distress.  HENT:  Head: Normocephalic and atraumatic.  Cardiovascular: Normal rate, regular rhythm and normal heart sounds.   No murmur heard. Pulmonary/Chest: Effort normal and breath sounds normal.  Musculoskeletal: She exhibits edema and tenderness.       Right hand: She exhibits tenderness and swelling. She exhibits normal range of motion, no bony tenderness, normal two-point discrimination, normal capillary refill, no deformity and no laceration. normal sensation noted. Normal strength noted.       Hands:      Tenderness to palpation of the distal right wrist. Mild soft tissue swelling is present. Radial pulse is strong, distal sensation is intact, cap refill is less than 2 seconds.  Grip strength is strong and equal bilaterally. Patient has surgical scar to the right wrist which appears to be well-healed. No erythema of the hand or wrist.  Neurological: She exhibits normal muscle tone. Coordination normal.  Skin: Skin is warm and dry.    ED Course  Procedures (including critical care time)     Display and was applied to the right wrist for comfort. Pain is improved. She remains neurovascularly intact.  MDM   Previous ED notes and nursing notes were reviewed by me.   Patient has well healing scars to the bilateral wrist.  No erythema, mild STS of the right distal wrist.  Radial pulse is brisk, sensation intact, CR< 2 sec. mild tenderness and soft tissue swelling of the right distal wrist pain is thought to be related to tendinitis.  Do not suspect a infectious process. Patient has appt with Dr. Hilda Lias on Monday July 02, 2011.     Patient / Family / Caregiver understand and agree with initial ED impression and plan with expectations set for ED visit. Pt stable in ED with no significant deterioration  in condition. Pt feels improved after observation and/or treatment in ED.        Obryan Radu L. Rocio Roam, PA 07/01/11 0053  Saqib Cazarez L. [place], [place] 07/01/11 [phone]

## 2011-06-30 NOTE — ED Notes (Signed)
Swelling to bilateral hands, recent carpal tunnel surgery ( Jan. 2013)

## 2011-07-01 NOTE — ED Provider Notes (Signed)
Medical screening examination/treatment/procedure(s) were performed by non-physician practitioner and as supervising physician I was immediately available for consultation/collaboration.  Cheri Guppy, MD 07/01/11 [phone]

## 2011-11-17 ENCOUNTER — Emergency Department (HOSPITAL_COMMUNITY): Payer: Medicaid Other

## 2011-11-17 ENCOUNTER — Encounter (HOSPITAL_COMMUNITY): Payer: Self-pay | Admitting: *Deleted

## 2011-11-17 ENCOUNTER — Emergency Department (HOSPITAL_COMMUNITY)
Admission: EM | Admit: 2011-11-17 | Discharge: 2011-11-17 | Disposition: A | Discharge status: Home / Self Care | Payer: Medicaid Other | Attending: Emergency Medicine | Admitting: Emergency Medicine

## 2011-11-17 DIAGNOSIS — S139XXA Sprain of joints and ligaments of unspecified parts of neck, initial encounter: Secondary | ICD-10-CM | POA: Insufficient documentation

## 2011-11-17 DIAGNOSIS — Y92410 Unspecified street and highway as the place of occurrence of the external cause: Secondary | ICD-10-CM | POA: Insufficient documentation

## 2011-11-17 DIAGNOSIS — I10 Essential (primary) hypertension: Secondary | ICD-10-CM | POA: Insufficient documentation

## 2011-11-17 DIAGNOSIS — S335XXA Sprain of ligaments of lumbar spine, initial encounter: Secondary | ICD-10-CM | POA: Insufficient documentation

## 2011-11-17 DIAGNOSIS — S161XXA Strain of muscle, fascia and tendon at neck level, initial encounter: Secondary | ICD-10-CM

## 2011-11-17 DIAGNOSIS — S63509A Unspecified sprain of unspecified wrist, initial encounter: Secondary | ICD-10-CM | POA: Insufficient documentation

## 2011-11-17 DIAGNOSIS — F17200 Nicotine dependence, unspecified, uncomplicated: Secondary | ICD-10-CM | POA: Insufficient documentation

## 2011-11-17 DIAGNOSIS — Z9089 Acquired absence of other organs: Secondary | ICD-10-CM | POA: Insufficient documentation

## 2011-11-17 DIAGNOSIS — S39012A Strain of muscle, fascia and tendon of lower back, initial encounter: Secondary | ICD-10-CM

## 2011-11-17 DIAGNOSIS — Z79899 Other long term (current) drug therapy: Secondary | ICD-10-CM | POA: Insufficient documentation
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
## 2011-11-17 MED ORDER — HYDROCODONE-ACETAMINOPHEN 5-325 MG PO TABS
1.0000 | ORAL_TABLET | Freq: Once | ORAL | Status: AC
  Administered 2011-11-17: 1 via ORAL
  Filled 2011-11-17: qty 1

## 2011-11-17 MED ORDER — HYDROCODONE-ACETAMINOPHEN 7.5-325 MG PO TABS: 1.0000 | ORAL_TABLET | ORAL | Status: AC | PRN

## 2011-11-17 MED ORDER — HYDROCODONE-ACETAMINOPHEN 5-325 MG PO TABS
2.0000 | ORAL_TABLET | Freq: Once | ORAL | Status: AC
  Administered 2011-11-17: 2 via ORAL
  Filled 2011-11-17: qty 2

## 2011-11-17 MED ORDER — DIAZEPAM 5 MG PO TABS
5.0000 mg | ORAL_TABLET | Freq: Once | ORAL | Status: AC
  Administered 2011-11-17: 5 mg via ORAL
  Filled 2011-11-17: qty 1

## 2011-11-17 MED ORDER — ONDANSETRON HCL 4 MG PO TABS
4.0000 mg | ORAL_TABLET | Freq: Once | ORAL | Status: AC
  Administered 2011-11-17: 4 mg via ORAL
  Filled 2011-11-17: qty 1

## 2011-11-17 MED ORDER — METHOCARBAMOL 500 MG PO TABS: ORAL_TABLET | ORAL | Status: DC

## 2011-11-17 NOTE — ED Provider Notes (Signed)
Medical screening examination/treatment/procedure(s) were performed by non-physician practitioner and as supervising physician I was immediately available for consultation/collaboration.  Geoffery Lyons, MD 11/17/11 2107

## 2011-11-17 NOTE — ED Notes (Signed)
Pt discharged. Pt stable at time of discharge. Medications reviewed pt has no questions regarding discharge at this time. Pt voiced understanding of discharge instructions.  
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
## 2011-12-02 ENCOUNTER — Emergency Department (HOSPITAL_COMMUNITY)
Admission: EM | Admit: 2011-12-02 | Discharge: 2011-12-02 | Disposition: A | Discharge status: Home / Self Care | Payer: Medicaid Other | Attending: Emergency Medicine | Admitting: Emergency Medicine

## 2011-12-02 ENCOUNTER — Encounter (HOSPITAL_COMMUNITY): Payer: Self-pay | Admitting: Emergency Medicine

## 2011-12-02 DIAGNOSIS — F17200 Nicotine dependence, unspecified, uncomplicated: Secondary | ICD-10-CM | POA: Insufficient documentation

## 2011-12-02 DIAGNOSIS — D573 Sickle-cell trait: Secondary | ICD-10-CM | POA: Insufficient documentation

## 2011-12-02 DIAGNOSIS — J069 Acute upper respiratory infection, unspecified: Secondary | ICD-10-CM | POA: Insufficient documentation

## 2011-12-02 DIAGNOSIS — Z880 Allergy status to penicillin: Secondary | ICD-10-CM | POA: Insufficient documentation

## 2011-12-02 MED ORDER — ERYTHROMYCIN BASE 250 MG PO TABS: 250.0000 mg | ORAL_TABLET | Freq: Three times a day (TID) | ORAL | Status: AC

## 2011-12-02 NOTE — ED Notes (Signed)
Pt c/o sore throat and dry, np cough x 3 days.

## 2011-12-02 NOTE — ED Provider Notes (Signed)
History     CSN: [id number]  Arrival date & time 12/02/11  8657   First MD Initiated Contact with Patient 12/02/11 [phone]      Chief Complaint  Patient presents with  . Cough    (Consider location/radiation/quality/duration/timing/severity/associated sxs/prior treatment) Patient is a 51 y.o. female presenting with cough. The history is provided by the patient.  Cough This is a new problem. The current episode started more than 2 days ago. The problem occurs hourly. The problem has not changed since onset.The cough is non-productive. There has been no fever. Associated symptoms include rhinorrhea. Pertinent negatives include no chest pain, no chills, no shortness of breath and no wheezing. She has tried nothing for the symptoms. She is a smoker. Her past medical history is significant for bronchitis.    Past Medical History  Diagnosis Date  . Sickle cell trait   . Hypertension     does not take meds  . Blood dyscrasia     sickle cell trait    Past Surgical History  Procedure Date  . Appendectomy   . Incision and drainage perirectal abscess 12/21/09  . Cesarean section     x 2  . Proctoscopy 10/17/2010    Procedure: PROCTOSCOPY;  Surgeon: Marlane Hatcher;  Location: [facility];  Service: General;  Laterality: N/A;  Rigid Proctoscopy/Possible Fistula in Ano  Procedure ended at 1003  . Therapeutic abortion     x2  . Carpal tunnel release 03/23/2011    Procedure: CARPAL TUNNEL RELEASE;  Surgeon: Darreld Mclean;  Location: [facility];  Service: Orthopedics;  Laterality: Left;  . Carpal tunnel release 05/10/2011    Procedure: CARPAL TUNNEL RELEASE;  Surgeon: Darreld Mclean, MD;  Location: [facility];  Service: Orthopedics;  Laterality: Right;    Family History  Problem Relation Age of Onset  . Kidney failure Daughter   . Sickle cell anemia Daughter   . Sickle cell anemia Son   . Anesthesia problems Neg Hx   . Hypotension Neg Hx   . Malignant hyperthermia Neg Hx   . Pseudochol deficiency  Neg Hx     History  Substance Use Topics  . Smoking status: Current Everyday Smoker -- 0.5 packs/day for 30 years    Types: Cigarettes  . Smokeless tobacco: Not on file  . Alcohol Use: No     one year and three months    OB History    Grav Para Term Preterm Abortions TAB SAB Ect Mult Living   4 2 2  2            Review of Systems  Constitutional: Negative for chills and activity change.       All ROS Neg except as noted in HPI  HENT: Positive for rhinorrhea. Negative for nosebleeds and neck pain.   Eyes: Negative for photophobia and discharge.  Respiratory: Positive for cough. Negative for shortness of breath and wheezing.   Cardiovascular: Negative for chest pain and palpitations.  Gastrointestinal: Negative for abdominal pain and blood in stool.  Genitourinary: Negative for dysuria, frequency and hematuria.  Musculoskeletal: Negative for back pain and arthralgias.  Skin: Negative.   Neurological: Negative for dizziness, seizures and speech difficulty.  Psychiatric/Behavioral: Negative for hallucinations and confusion.    Allergies  Penicillins; Penicillins cross reactors; and Tape  Home Medications   Current Outpatient Rx  Name Route Sig Dispense Refill  . ERYTHROMYCIN BASE 250 MG PO TABS Oral Take 1 tablet (250 mg total) by mouth 3 (three) times daily  after meals. 15 tablet 0  . HYDROCODONE-ACETAMINOPHEN 7.5-325 MG PO TABS Oral Take 1 tablet by mouth every 6 (six) hours as needed.    . IBUPROFEN 600 MG PO TABS Oral Take 600 mg by mouth every 6 (six) hours as needed. For pain    . LISINOPRIL 10 MG PO TABS Oral Take 10 mg by mouth daily.    Marland Kitchen MEDROXYPROGESTERONE ACETATE 150 MG/ML IM SUSP Intramuscular Inject 150 mg into the muscle every 3 (three) months.      . METHOCARBAMOL 500 MG PO TABS  2 po bid for spasm 24 tablet 0  . ADULT MULTIVITAMIN W/MINERALS CH Oral Take 1 tablet by mouth daily.      BP 129/81  Pulse 83  Temp 98.4 F (36.9 C)  Resp 16  Ht 5\' 7"  (1.702  m)  Wt 168 lb (76.204 kg)  BMI 26.31 kg/m2  SpO2 99%  Physical Exam  Nursing note and vitals reviewed. Constitutional: She is oriented to person, place, and time. She appears well-developed and well-nourished.  Non-toxic appearance.  HENT:  Head: Normocephalic.  Right Ear: Tympanic membrane and external ear normal.  Left Ear: Tympanic membrane and external ear normal.       The uvula is enlarged. The airway is patent. There is no H. date appreciated.  Eyes: EOM and lids are normal. Pupils are equal, round, and reactive to light.  Neck: Normal range of motion. Neck supple. Carotid bruit is not present.  Cardiovascular: Normal rate, regular rhythm, normal heart sounds, intact distal pulses and normal pulses.   Pulmonary/Chest: Breath sounds normal. No respiratory distress.  Abdominal: Soft. Bowel sounds are normal. There is no tenderness. There is no guarding.  Musculoskeletal: Normal range of motion.  Lymphadenopathy:       Head (right side): No submandibular adenopathy present.       Head (left side): No submandibular adenopathy present.    She has no cervical adenopathy.  Neurological: She is alert and oriented to person, place, and time. She has normal strength. No cranial nerve deficit or sensory deficit.  Skin: Skin is warm and dry.  Psychiatric: She has a normal mood and affect. Her speech is normal.    ED Course  Procedures (including critical care time)  Labs Reviewed - No data to display No results found.   1. URI (upper respiratory infection)       MDM  I have reviewed nursing notes, vital signs, and all appropriate lab and imaging results for this patient. Patient noted to have an upper respiratory infection with some enlargement of her uvula which I believe is causing her to have the cough particularly at night. The patient is to use salt water gargles. She will be treated with erythromycin 3 times daily. She is to wash hands frequently and increase  fluids.       Kathie Dike, [place] 12/02/11 1007

## 2011-12-03 NOTE — ED Provider Notes (Signed)
Medical screening examination/treatment/procedure(s) were performed by non-physician practitioner and as supervising physician I was immediately available for consultation/collaboration.   Shelda Jakes, MD 12/03/11 [phone]

## 2011-12-26 ENCOUNTER — Emergency Department (HOSPITAL_COMMUNITY)
Admission: EM | Admit: 2011-12-26 | Discharge: 2011-12-26 | Disposition: A | Discharge status: Home / Self Care | Payer: Medicaid Other

## 2011-12-26 NOTE — ED Notes (Signed)
Called from registration. Pt left before triage.

## 2011-12-28 ENCOUNTER — Encounter (HOSPITAL_COMMUNITY): Payer: Self-pay | Admitting: *Deleted

## 2011-12-28 ENCOUNTER — Emergency Department (HOSPITAL_COMMUNITY)
Admission: EM | Admit: 2011-12-28 | Discharge: 2011-12-28 | Disposition: A | Discharge status: Home / Self Care | Payer: Medicaid Other | Attending: Emergency Medicine | Admitting: Emergency Medicine

## 2011-12-28 DIAGNOSIS — F17200 Nicotine dependence, unspecified, uncomplicated: Secondary | ICD-10-CM | POA: Insufficient documentation

## 2011-12-28 DIAGNOSIS — J40 Bronchitis, not specified as acute or chronic: Secondary | ICD-10-CM | POA: Insufficient documentation

## 2011-12-28 DIAGNOSIS — Z880 Allergy status to penicillin: Secondary | ICD-10-CM | POA: Insufficient documentation

## 2011-12-28 DIAGNOSIS — D573 Sickle-cell trait: Secondary | ICD-10-CM | POA: Insufficient documentation

## 2011-12-28 MED ORDER — AZITHROMYCIN 250 MG PO TABS: ORAL_TABLET | ORAL | Status: DC

## 2011-12-28 MED ORDER — ALBUTEROL SULFATE HFA 108 (90 BASE) MCG/ACT IN AERS
2.0000 | INHALATION_SPRAY | Freq: Once | RESPIRATORY_TRACT | Status: AC
  Administered 2011-12-28: 2 via RESPIRATORY_TRACT
  Filled 2011-12-28: qty 6.7

## 2011-12-28 MED ORDER — GUAIFENESIN-CODEINE 100-10 MG/5ML PO SYRP: 10.0000 mL | ORAL_SOLUTION | Freq: Three times a day (TID) | ORAL | Status: AC | PRN

## 2011-12-28 NOTE — ED Notes (Signed)
Cough, congestion, no fever

## 2011-12-29 NOTE — ED Provider Notes (Signed)
History     CSN: [id number]  Arrival date & time 12/28/11  1301   First MD Initiated Contact with Patient 12/28/11 1333      Chief Complaint  Patient presents with  . Cough    (Consider location/radiation/quality/duration/timing/severity/associated sxs/prior treatment) HPI Comments: patient c/o intermittently productive cough for several days.  Also c/o nasal congestion and runny nose.  She denies fever, chest pain or tightness, shortness of breath or vomiting. Pt is a smoker and has hx of COPD and sickle cell trait  Patient is a 51 y.o. female presenting with cough. The history is provided by the patient.  Cough This is a new problem. The current episode started more than 2 days ago. The problem occurs every few minutes. The problem has not changed since onset.The cough is productive of sputum. There has been no fever. Associated symptoms include rhinorrhea. Pertinent negatives include no chest pain, no chills, no sweats, no ear congestion, no ear pain, no headaches, no sore throat, no myalgias, no shortness of breath and no wheezing. She has tried nothing for the symptoms. The treatment provided no relief. She is a smoker. Her past medical history is significant for bronchitis. Her past medical history does not include pneumonia, COPD or asthma.    Past Medical History  Diagnosis Date  . Sickle cell trait   . Hypertension     does not take meds  . Blood dyscrasia     sickle cell trait    Past Surgical History  Procedure Date  . Appendectomy   . Incision and drainage perirectal abscess 12/21/09  . Cesarean section     x 2  . Proctoscopy 10/17/2010    Procedure: PROCTOSCOPY;  Surgeon: Marlane Hatcher;  Location: [facility];  Service: General;  Laterality: N/A;  Rigid Proctoscopy/Possible Fistula in Ano  Procedure ended at 1003  . Therapeutic abortion     x2  . Carpal tunnel release 03/23/2011    Procedure: CARPAL TUNNEL RELEASE;  Surgeon: Darreld Mclean;  Location: [facility];   Service: Orthopedics;  Laterality: Left;  . Carpal tunnel release 05/10/2011    Procedure: CARPAL TUNNEL RELEASE;  Surgeon: Darreld Mclean, MD;  Location: [facility];  Service: Orthopedics;  Laterality: Right;    Family History  Problem Relation Age of Onset  . Kidney failure Daughter   . Sickle cell anemia Daughter   . Sickle cell anemia Son   . Anesthesia problems Neg Hx   . Hypotension Neg Hx   . Malignant hyperthermia Neg Hx   . Pseudochol deficiency Neg Hx     History  Substance Use Topics  . Smoking status: Current Every Day Smoker -- 0.5 packs/day for 30 years    Types: Cigarettes  . Smokeless tobacco: Not on file  . Alcohol Use: No     one year and three months    OB History    Grav Para Term Preterm Abortions TAB SAB Ect Mult Living   4 2 2  2            Review of Systems  Constitutional: Negative for fever, chills, activity change and appetite change.  HENT: Positive for congestion and rhinorrhea. Negative for ear pain, sore throat, facial swelling, trouble swallowing, neck pain and neck stiffness.   Eyes: Negative for visual disturbance.  Respiratory: Positive for cough. Negative for chest tightness, shortness of breath, wheezing and stridor.   Cardiovascular: Negative for chest pain.  Gastrointestinal: Negative for nausea, vomiting and abdominal pain.  Musculoskeletal:  Negative for myalgias and arthralgias.  Skin: Negative.   Neurological: Negative for dizziness, weakness, numbness and headaches.  Hematological: Negative for adenopathy.  Psychiatric/Behavioral: Negative for confusion.  All other systems reviewed and are negative.    Allergies  Penicillins; Penicillins cross reactors; and Tape  Home Medications   Current Outpatient Rx  Name Route Sig Dispense Refill  . AZITHROMYCIN 250 MG PO TABS  Take two tablets on day one, then one tab qd days 2-5 6 tablet 0  . GUAIFENESIN-CODEINE 100-10 MG/5ML PO SYRP Oral Take 10 mLs by mouth 3 (three) times daily as  needed for cough. 120 mL 0  . HYDROCODONE-ACETAMINOPHEN 7.5-325 MG PO TABS Oral Take 1 tablet by mouth every 6 (six) hours as needed.    . IBUPROFEN 600 MG PO TABS Oral Take 600 mg by mouth every 6 (six) hours as needed. For pain    . LISINOPRIL 10 MG PO TABS Oral Take 10 mg by mouth daily.    Marland Kitchen MEDROXYPROGESTERONE ACETATE 150 MG/ML IM SUSP Intramuscular Inject 150 mg into the muscle every 3 (three) months.      . METHOCARBAMOL 500 MG PO TABS  2 po bid for spasm 24 tablet 0  . ADULT MULTIVITAMIN W/MINERALS CH Oral Take 1 tablet by mouth daily.      BP 128/74  Pulse 91  Temp 98.6 F (37 C) (Oral)  Resp 20  Ht 5\' 5"  (1.651 m)  Wt 162 lb (73.483 kg)  BMI 26.96 kg/m2  SpO2 100%  Physical Exam  Nursing note and vitals reviewed. Constitutional: She is oriented to person, place, and time. She appears well-developed and well-nourished. No distress.  HENT:  Head: Normocephalic and atraumatic.  Right Ear: Tympanic membrane and ear canal normal.  Left Ear: Tympanic membrane and ear canal normal.  Nose: Mucosal edema present.  Mouth/Throat: Uvula is midline, oropharynx is clear and moist and mucous membranes are normal. No oropharyngeal exudate.  Eyes: EOM are normal. Pupils are equal, round, and reactive to light.  Neck: Normal range of motion. Neck supple.  Cardiovascular: Normal rate, regular rhythm, normal heart sounds and intact distal pulses.   No murmur heard. Pulmonary/Chest: Effort normal. No respiratory distress. She has no wheezes. She has no rales. She exhibits no tenderness.       Coarse lungs sounds bilaterally.  No rales or wheezes  Musculoskeletal: She exhibits no edema.  Lymphadenopathy:    She has no cervical adenopathy.  Neurological: She is alert and oriented to person, place, and time. She exhibits normal muscle tone. Coordination normal.  Skin: Skin is warm and dry.    ED Course  Procedures (including critical care time)  Labs Reviewed - No data to display No  results found.   1. Bronchitis       MDM    Lung sounds are coarse bilaterally w/o wheezing or rales, nasal congestion. Pt is otherwise well appearing.  Vitals stable no hypoxia, tachycardia or tachypnea to suggest PE.  Pt agrees to close f/u with her PMD or to return here if the sx's worsen  The patient appears reasonably screened and/or stabilized for discharge and I doubt any other medical condition or other [facility] requiring further screening, evaluation, or treatment in the ED at this time prior to discharge.   Rx: zithromax Guiatuss AC (dispensed albuterol MDI)        Kirston Luty L. [place], [place] 12/29/11 [phone]

## 2012-01-01 NOTE — ED Provider Notes (Signed)
Medical screening examination/treatment/procedure(s) were performed by non-physician practitioner and as supervising physician I was immediately available for consultation/collaboration.  Donnetta Hutching, MD 01/01/12 1742

## 2012-01-21 ENCOUNTER — Ambulatory Visit (HOSPITAL_COMMUNITY)
Admission: RE | Admit: 2012-01-21 | Discharge: 2012-01-21 | Disposition: A | Discharge status: Home / Self Care | Payer: Medicaid Other | Source: Ambulatory Visit | Attending: Family Medicine | Admitting: Family Medicine

## 2012-01-21 DIAGNOSIS — Z139 Encounter for screening, unspecified: Secondary | ICD-10-CM

## 2012-01-21 DIAGNOSIS — Z1231 Encounter for screening mammogram for malignant neoplasm of breast: Secondary | ICD-10-CM | POA: Insufficient documentation

## 2012-02-04 ENCOUNTER — Emergency Department (HOSPITAL_COMMUNITY)
Admission: EM | Admit: 2012-02-04 | Discharge: 2012-02-04 | Disposition: A | Discharge status: Home / Self Care | Payer: Medicaid Other | Attending: Emergency Medicine | Admitting: Emergency Medicine

## 2012-02-04 ENCOUNTER — Encounter (HOSPITAL_COMMUNITY): Payer: Self-pay | Admitting: Emergency Medicine

## 2012-02-04 DIAGNOSIS — L02419 Cutaneous abscess of limb, unspecified: Secondary | ICD-10-CM | POA: Insufficient documentation

## 2012-02-04 DIAGNOSIS — I10 Essential (primary) hypertension: Secondary | ICD-10-CM | POA: Insufficient documentation

## 2012-02-04 DIAGNOSIS — F17200 Nicotine dependence, unspecified, uncomplicated: Secondary | ICD-10-CM | POA: Insufficient documentation

## 2012-02-04 DIAGNOSIS — L03119 Cellulitis of unspecified part of limb: Secondary | ICD-10-CM | POA: Insufficient documentation

## 2012-02-04 DIAGNOSIS — L0231 Cutaneous abscess of buttock: Secondary | ICD-10-CM | POA: Insufficient documentation

## 2012-02-04 DIAGNOSIS — Z862 Personal history of diseases of the blood and blood-forming organs and certain disorders involving the immune mechanism: Secondary | ICD-10-CM | POA: Insufficient documentation

## 2012-02-04 DIAGNOSIS — R05 Cough: Secondary | ICD-10-CM | POA: Insufficient documentation

## 2012-02-04 DIAGNOSIS — Z79899 Other long term (current) drug therapy: Secondary | ICD-10-CM | POA: Insufficient documentation

## 2012-02-04 DIAGNOSIS — J3489 Other specified disorders of nose and nasal sinuses: Secondary | ICD-10-CM | POA: Insufficient documentation

## 2012-02-04 DIAGNOSIS — R059 Cough, unspecified: Secondary | ICD-10-CM | POA: Insufficient documentation

## 2012-02-04 DIAGNOSIS — L0291 Cutaneous abscess, unspecified: Secondary | ICD-10-CM

## 2012-02-04 MED ORDER — HYDROCODONE-ACETAMINOPHEN 5-325 MG PO TABS: 1.0000 | ORAL_TABLET | ORAL | Status: AC | PRN

## 2012-02-04 MED ORDER — DOXYCYCLINE HYCLATE 100 MG PO CAPS: 100.0000 mg | ORAL_CAPSULE | Freq: Two times a day (BID) | ORAL | Status: DC

## 2012-02-04 MED ORDER — CIPROFLOXACIN HCL 500 MG PO TABS: 500.0000 mg | ORAL_TABLET | Freq: Two times a day (BID) | ORAL | Status: DC

## 2012-02-04 NOTE — ED Provider Notes (Signed)
Medical screening examination/treatment/procedure(s) were performed by non-physician practitioner and as supervising physician I was immediately available for consultation/collaboration.   Milynn Quirion L Kenlynn Houde, MD 02/04/12 1513 

## 2012-02-04 NOTE — ED Provider Notes (Signed)
History     CSN: [id number]  Arrival date & time 02/04/12  1038   First MD Initiated Contact with Patient 02/04/12 1228      Chief Complaint  Patient presents with  . Abscess    (Consider location/radiation/quality/duration/timing/severity/associated sxs/prior treatment) Patient is a 51 y.o. female presenting with abscess. The history is provided by the patient.  Abscess  This is a new problem. The current episode started less than one week ago. The problem occurs frequently. The problem has been gradually worsening. The abscess is present on the left buttock and left upper leg (right labia). The problem is moderate. The abscess is characterized by painfulness. It is unknown what she was exposed to. Associated symptoms include congestion and cough. Pertinent negatives include no fever. There were no sick contacts. She has received no recent medical care.    Past Medical History  Diagnosis Date  . Sickle cell trait   . Hypertension     does not take meds  . Blood dyscrasia     sickle cell trait    Past Surgical History  Procedure Date  . Appendectomy   . Incision and drainage perirectal abscess 12/21/09  . Cesarean section     x 2  . Proctoscopy 10/17/2010    Procedure: PROCTOSCOPY;  Surgeon: Marlane Hatcher;  Location: [facility];  Service: General;  Laterality: N/A;  Rigid Proctoscopy/Possible Fistula in Ano  Procedure ended at 1003  . Therapeutic abortion     x2  . Carpal tunnel release 03/23/2011    Procedure: CARPAL TUNNEL RELEASE;  Surgeon: Darreld Mclean;  Location: [facility];  Service: Orthopedics;  Laterality: Left;  . Carpal tunnel release 05/10/2011    Procedure: CARPAL TUNNEL RELEASE;  Surgeon: Darreld Mclean, MD;  Location: [facility];  Service: Orthopedics;  Laterality: Right;    Family History  Problem Relation Age of Onset  . Kidney failure Daughter   . Sickle cell anemia Daughter   . Sickle cell anemia Son   . Anesthesia problems Neg Hx   . Hypotension Neg Hx     . Malignant hyperthermia Neg Hx   . Pseudochol deficiency Neg Hx     History  Substance Use Topics  . Smoking status: Current Every Day Smoker -- 0.5 packs/day for 30 years    Types: Cigarettes  . Smokeless tobacco: Not on file  . Alcohol Use: No     Comment: one year and three months    OB History    Grav Para Term Preterm Abortions TAB SAB Ect Mult Living   4 2 2  2            Review of Systems  Constitutional: Negative for fever and activity change.       All ROS Neg except as noted in HPI  HENT: Positive for congestion. Negative for nosebleeds and neck pain.   Eyes: Negative for photophobia and discharge.  Respiratory: Positive for cough. Negative for shortness of breath and wheezing.   Cardiovascular: Negative for chest pain and palpitations.  Gastrointestinal: Negative for abdominal pain and blood in stool.  Genitourinary: Negative for dysuria, frequency and hematuria.  Musculoskeletal: Negative for back pain and arthralgias.  Skin: Negative.        abscess  Neurological: Negative for dizziness, seizures and speech difficulty.  Psychiatric/Behavioral: Negative for hallucinations and confusion.    Allergies  Penicillins; Penicillins cross reactors; and Tape  Home Medications   Current Outpatient Rx  Name  Route  Sig  Dispense  Refill  . IBUPROFEN 600 MG PO TABS   Oral   Take 600 mg by mouth every 6 (six) hours as needed. For pain         . LISINOPRIL 10 MG PO TABS   Oral   Take 10 mg by mouth daily.         . ADULT MULTIVITAMIN W/MINERALS CH   Oral   Take 1 tablet by mouth daily.         Marland Kitchen MEDROXYPROGESTERONE ACETATE 150 MG/ML IM SUSP   Intramuscular   Inject 150 mg into the muscle every 3 (three) months.             BP 153/66  Pulse 84  Temp 99.5 F (37.5 C)  Resp 18  Wt 162 lb (73.483 kg)  SpO2 100%  Physical Exam  Nursing note and vitals reviewed. Constitutional: She is oriented to person, place, and time. She appears  well-developed and well-nourished.  Non-toxic appearance.  HENT:  Head: Normocephalic.  Right Ear: Tympanic membrane and external ear normal.  Left Ear: Tympanic membrane and external ear normal.       Mild nasal congestion present.  Eyes: EOM and lids are normal. Pupils are equal, round, and reactive to light.  Neck: Normal range of motion. Neck supple. Carotid bruit is not present.  Cardiovascular: Normal rate, regular rhythm, normal heart sounds, intact distal pulses and normal pulses.   Pulmonary/Chest: Breath sounds normal. No respiratory distress. She has no wheezes. She has no rales.  Abdominal: Soft. Bowel sounds are normal. There is no tenderness. There is no guarding.  Genitourinary:       There is a small nonfluctuant abscess on the upper inner left thigh. A small nonfluctuant abscess on the left buttocks. In a small nonfluctuant abscess of the right labia. None of these are draining. They are warm but not hot. There is no red streaking noted at any of these areas. Chaperone present during examination.  Musculoskeletal: Normal range of motion.  Lymphadenopathy:       Head (right side): No submandibular adenopathy present.       Head (left side): No submandibular adenopathy present.    She has no cervical adenopathy.  Neurological: She is alert and oriented to person, place, and time. She has normal strength. No cranial nerve deficit or sensory deficit.  Skin: Skin is warm and dry.  Psychiatric: She has a normal mood and affect. Her speech is normal.    ED Course  Procedures (including critical care time)  Labs Reviewed - No data to display No results found.   No diagnosis found.    MDM  I have reviewed nursing notes, vital signs, and all appropriate lab and imaging results for this patient. Patient advised to use warm saltwater soaks. Prescription for Cipro and Septra given to the patient. Patient also given prescription for Norco #15 tablets given to the  patient.       Kathie Dike, [place] 02/04/12 1242

## 2012-02-04 NOTE — ED Notes (Signed)
Alert,NAd,   Sore , swollen perirectal area, and in pubic area, Pt says she has had "boils" in this area before and wants an antibiotic and something for pain.  Also a cough with yellow sputum.

## 2012-02-04 NOTE — ED Notes (Signed)
Pt c/o boils to labia/buttocks/thigh and cold x 2 days

## 2012-02-16 ENCOUNTER — Encounter (HOSPITAL_COMMUNITY): Payer: Self-pay

## 2012-02-16 ENCOUNTER — Emergency Department (HOSPITAL_COMMUNITY): Payer: Medicaid Other

## 2012-02-16 ENCOUNTER — Emergency Department (HOSPITAL_COMMUNITY)
Admission: EM | Admit: 2012-02-16 | Discharge: 2012-02-16 | Disposition: A | Discharge status: Home / Self Care | Payer: Medicaid Other | Attending: Emergency Medicine | Admitting: Emergency Medicine

## 2012-02-16 DIAGNOSIS — I10 Essential (primary) hypertension: Secondary | ICD-10-CM | POA: Insufficient documentation

## 2012-02-16 DIAGNOSIS — R109 Unspecified abdominal pain: Secondary | ICD-10-CM | POA: Insufficient documentation

## 2012-02-16 DIAGNOSIS — Z79899 Other long term (current) drug therapy: Secondary | ICD-10-CM | POA: Insufficient documentation

## 2012-02-16 DIAGNOSIS — F17200 Nicotine dependence, unspecified, uncomplicated: Secondary | ICD-10-CM | POA: Insufficient documentation

## 2012-02-16 DIAGNOSIS — R079 Chest pain, unspecified: Secondary | ICD-10-CM | POA: Insufficient documentation

## 2012-02-16 DIAGNOSIS — D573 Sickle-cell trait: Secondary | ICD-10-CM | POA: Insufficient documentation

## 2012-02-16 DIAGNOSIS — M549 Dorsalgia, unspecified: Secondary | ICD-10-CM | POA: Insufficient documentation

## 2012-02-16 LAB — CBC WITH DIFFERENTIAL/PLATELET
Basophils Absolute: 0 10*3/uL (ref 0.0–0.1)
Eosinophils Relative: 1 % (ref 0–5)
HCT: 32.5 % — ABNORMAL LOW (ref 36.0–46.0)
Lymphocytes Relative: 50 % — ABNORMAL HIGH (ref 12–46)
MCHC: 34.2 g/dL (ref 30.0–36.0)
MCV: 83.8 fL (ref 78.0–100.0)
Monocytes Absolute: 0.3 10*3/uL (ref 0.1–1.0)
Monocytes Relative: 4 % (ref 3–12)
RDW: 13.2 % (ref 11.5–15.5)
WBC: 6.1 10*3/uL (ref 4.0–10.5)

## 2012-02-16 LAB — COMPREHENSIVE METABOLIC PANEL
AST: 16 U/L (ref 0–37)
BUN: 8 mg/dL (ref 6–23)
CO2: 25 mEq/L (ref 19–32)
Calcium: 9.8 mg/dL (ref 8.4–10.5)
Creatinine, Ser: 0.73 mg/dL (ref 0.50–1.10)
GFR calc Af Amer: >90 mL/min (ref >90)
GFR calc non Af Amer: >90 mL/min (ref >90)
Glucose, Bld: 132 mg/dL — ABNORMAL HIGH (ref 70–99)

## 2012-02-16 LAB — URINALYSIS, ROUTINE W REFLEX MICROSCOPIC
Ketones, ur: NEGATIVE mg/dL
Leukocytes, UA: NEGATIVE
Nitrite: NEGATIVE
Protein, ur: NEGATIVE mg/dL
Urobilinogen, UA: 0.2 mg/dL (ref 0.0–1.0)

## 2012-02-16 LAB — TROPONIN I: Troponin I: <0.3 ng/mL (ref <0.30)

## 2012-02-16 LAB — LIPASE, BLOOD: Lipase: 40 U/L (ref 11–59)

## 2012-02-16 MED ORDER — ONDANSETRON HCL 4 MG/2ML IJ SOLN
4.0000 mg | Freq: Once | INTRAMUSCULAR | Status: AC
  Administered 2012-02-16: 4 mg via INTRAVENOUS
  Filled 2012-02-16: qty 2

## 2012-02-16 MED ORDER — OXYCODONE-ACETAMINOPHEN 5-325 MG PO TABS: 1.0000 | ORAL_TABLET | Freq: Four times a day (QID) | ORAL | Status: DC | PRN

## 2012-02-16 MED ORDER — SODIUM CHLORIDE 0.9 % IV BOLUS (SEPSIS)
1000.0000 mL | Freq: Once | INTRAVENOUS | Status: AC
  Administered 2012-02-16: 1000 mL via INTRAVENOUS

## 2012-02-16 MED ORDER — DIPHENHYDRAMINE HCL 50 MG/ML IJ SOLN
25.0000 mg | Freq: Once | INTRAMUSCULAR | Status: AC
  Administered 2012-02-16: 25 mg via INTRAVENOUS
  Filled 2012-02-16: qty 1

## 2012-02-16 MED ORDER — OXYCODONE-ACETAMINOPHEN 5-325 MG PO TABS
1.0000 | ORAL_TABLET | Freq: Once | ORAL | Status: AC
  Administered 2012-02-16: 1 via ORAL
  Filled 2012-02-16: qty 1

## 2012-02-16 MED ORDER — KETOROLAC TROMETHAMINE 30 MG/ML IJ SOLN
30.0000 mg | Freq: Once | INTRAMUSCULAR | Status: AC
  Administered 2012-02-16: 30 mg via INTRAVENOUS
  Filled 2012-02-16: qty 1

## 2012-02-16 MED ORDER — HYDROMORPHONE HCL PF 1 MG/ML IJ SOLN
0.5000 mg | Freq: Once | INTRAMUSCULAR | Status: AC
  Administered 2012-02-16: 0.5 mg via INTRAVENOUS
  Filled 2012-02-16: qty 1

## 2012-02-16 MED ORDER — PROMETHAZINE HCL 25 MG PO TABS: 25.0000 mg | ORAL_TABLET | Freq: Four times a day (QID) | ORAL | Status: DC | PRN

## 2012-02-16 NOTE — ED Notes (Signed)
1. Pt reports back, ab pain and chest pain for 2 days, pain comes and goes. Denies sob, no vomiting, or diarrhea.

## 2012-02-16 NOTE — ED Notes (Signed)
Pt c/o chest, abdominal, and back pain since yesterday. Pt denies SOB, headache, dizziness. Pt states "i think it's gas".

## 2012-02-16 NOTE — ED Provider Notes (Signed)
History     CSN: [id number]  Arrival date & time 02/16/12  4098   First MD Initiated Contact with Patient 02/16/12 1821      Chief Complaint  Patient presents with  . Chest Pain  . Back Pain  . Abdominal Pain    (Consider location/radiation/quality/duration/timing/severity/associated sxs/prior treatment) HPI.... Constellation of symptoms including vague chest pain, abdominal pain, right flank pain. Symptoms are intermittent and last a short period of time and not associated with any activity. Severity is mild to moderate .  No fever, sweats, chills, dysuria, diaphoresis, dyspnea, nausea.   nothing makes symptoms better or worse.  No previous cardiac disease. She has had hematuria in the past for unknown reasons  Past Medical History  Diagnosis Date  . Sickle cell trait   . Hypertension     does not take meds  . Blood dyscrasia     sickle cell trait    Past Surgical History  Procedure Date  . Appendectomy   . Incision and drainage perirectal abscess 12/21/09  . Cesarean section     x 2  . Proctoscopy 10/17/2010    Procedure: PROCTOSCOPY;  Surgeon: Marlane Hatcher;  Location: [facility];  Service: General;  Laterality: N/A;  Rigid Proctoscopy/Possible Fistula in Ano  Procedure ended at 1003  . Therapeutic abortion     x2  . Carpal tunnel release 03/23/2011    Procedure: CARPAL TUNNEL RELEASE;  Surgeon: Darreld Mclean;  Location: [facility];  Service: Orthopedics;  Laterality: Left;  . Carpal tunnel release 05/10/2011    Procedure: CARPAL TUNNEL RELEASE;  Surgeon: Darreld Mclean, MD;  Location: [facility];  Service: Orthopedics;  Laterality: Right;    Family History  Problem Relation Age of Onset  . Kidney failure Daughter   . Sickle cell anemia Daughter   . Sickle cell anemia Son   . Anesthesia problems Neg Hx   . Hypotension Neg Hx   . Malignant hyperthermia Neg Hx   . Pseudochol deficiency Neg Hx     History  Substance Use Topics  . Smoking status: Current Every Day  Smoker -- 0.5 packs/day for 30 years    Types: Cigarettes  . Smokeless tobacco: Not on file  . Alcohol Use: No     Comment: one year and three months    OB History    Grav Para Term Preterm Abortions TAB SAB Ect Mult Living   4 2 2  2            Review of Systems  All other systems reviewed and are negative.    Allergies  Penicillins; Penicillins cross reactors; and Tape  Home Medications   Current Outpatient Rx  Name  Route  Sig  Dispense  Refill  . CIPROFLOXACIN HCL 500 MG PO TABS   Oral   Take 1 tablet (500 mg total) by mouth 2 (two) times daily.   14 tablet   0   . DOXYCYCLINE HYCLATE 100 MG PO CAPS   Oral   Take 1 capsule (100 mg total) by mouth 2 (two) times daily.   14 capsule   0   . IBUPROFEN 600 MG PO TABS   Oral   Take 600 mg by mouth every 6 (six) hours as needed. For pain         . LISINOPRIL 10 MG PO TABS   Oral   Take 10 mg by mouth daily.         Marland Kitchen MEDROXYPROGESTERONE ACETATE 150 MG/ML IM  SUSP   Intramuscular   Inject 150 mg into the muscle every 3 (three) months.           . ADULT MULTIVITAMIN W/MINERALS CH   Oral   Take 1 tablet by mouth daily.           BP 139/59  Pulse 66  Temp 98.2 F (36.8 C) (Oral)  Resp 20  Ht 5\' 7"  (1.702 m)  Wt 172 lb (78.019 kg)  BMI 26.94 kg/m2  SpO2 100%  Physical Exam  Nursing note and vitals reviewed. Constitutional: She is oriented to person, place, and time. She appears well-developed and well-nourished.  HENT:  Head: Normocephalic and atraumatic.  Eyes: Conjunctivae normal and EOM are normal. Pupils are equal, round, and reactive to light.  Neck: Normal range of motion. Neck supple.  Cardiovascular: Normal rate, regular rhythm and normal heart sounds.   Pulmonary/Chest: Effort normal and breath sounds normal.  Abdominal: Soft. Bowel sounds are normal.  Genitourinary:       Minimal right flank tenderness  Musculoskeletal: Normal range of motion.  Neurological: She is alert and  oriented to person, place, and time.  Skin: Skin is warm and dry.  Psychiatric: She has a normal mood and affect.    ED Course  Procedures (including critical care time)  Labs Reviewed  CBC WITH DIFFERENTIAL - Abnormal; Notable for the following:    Hemoglobin 11.1 (*)     HCT 32.5 (*)     Lymphocytes Relative 50 (*)     All other components within normal limits  COMPREHENSIVE METABOLIC PANEL - Abnormal; Notable for the following:    Potassium 3.2 (*)     Glucose, Bld 132 (*)     Total Bilirubin 0.1 (*)     All other components within normal limits  URINALYSIS, ROUTINE W REFLEX MICROSCOPIC - Abnormal; Notable for the following:    Hgb urine dipstick LARGE (*)     All other components within normal limits  LIPASE, BLOOD  TROPONIN I  URINE MICROSCOPIC-ADD ON    Ct Abdomen Pelvis Wo Contrast  02/16/2012  *RADIOLOGY REPORT*  Clinical Data: Right flank pain  CT ABDOMEN AND PELVIS WITHOUT CONTRAST  Technique:  Multidetector CT imaging of the abdomen and pelvis was performed following the standard protocol without intravenous contrast.  Comparison: None.  Findings: Mild atelectasis at the lung bases. There is mild pleural thickening along the right lung base posteriorly, similar to prior. Normal heart size.  No pleural or pericardial effusion.  Organ abnormality/lesion detection is limited in the absence of intravenous contrast. Within this limitation, unremarkable liver, biliary system, spleen, pancreas, adrenal glands.  Symmetric renal size.  No hydronephrosis or hydroureter.  No urinary tract calculi identified.  No bowel obstruction or CT evidence for colitis. No free intraperitoneal air or fluid.  No lymphadenopathy.  There is scattered atherosclerotic calcification of the aorta and its branches. No aneurysmal dilatation.  Decompressed bladder.  Nonspecific appearance to the uterus.  No adnexal mass. Trace fluid within the pelvis.  No acute osseous abnormality.  IMPRESSION: No acute  process identified on this unenhanced abdominal pelvic CT. No hydronephrosis or urinary tract calculi.   Original Report Authenticated By: Jearld Lesch, M.D.    Dg Chest 2 View  02/16/2012  *RADIOLOGY REPORT*  Clinical Data: Cough and congestion.  Back pain for 2 days. Smoking history.  CHEST - 2 VIEW  Comparison: 02/04/2011.  Findings: Normal cardiac and mediastinal silhouette.  Clear lung fields.  No  effusion or pneumothorax.  Negative osseous structures. No change from priors.  IMPRESSION: No active cardiopulmonary disease.   Original Report Authenticated By: Davonna Belling, M.D.    No results found.   No diagnosis found.   Date: 02/16/2012  Rate: 64  Rhythm: normal sinus rhythm  QRS Axis: normal  Intervals: normal  ST/T Wave abnormalities: normal  Conduction Disutrbances: none  Narrative Interpretation: unremarkable     MDM   Screening tests show minimal hematuria.  CT A/P neg acute.  Patient is in no acute distress.  D/C home c percocet #15, phenergan 25 mg #10          Donnetta Hutching, MD 02/16/12 2118

## 2012-03-07 ENCOUNTER — Encounter (HOSPITAL_COMMUNITY): Payer: Self-pay | Admitting: Emergency Medicine

## 2012-03-07 ENCOUNTER — Emergency Department (HOSPITAL_COMMUNITY)
Admission: EM | Admit: 2012-03-07 | Discharge: 2012-03-07 | Disposition: A | Discharge status: Home / Self Care | Payer: Medicaid Other | Attending: Emergency Medicine | Admitting: Emergency Medicine

## 2012-03-07 DIAGNOSIS — F17200 Nicotine dependence, unspecified, uncomplicated: Secondary | ICD-10-CM | POA: Insufficient documentation

## 2012-03-07 DIAGNOSIS — H9209 Otalgia, unspecified ear: Secondary | ICD-10-CM | POA: Insufficient documentation

## 2012-03-07 DIAGNOSIS — K0889 Other specified disorders of teeth and supporting structures: Secondary | ICD-10-CM

## 2012-03-07 DIAGNOSIS — I10 Essential (primary) hypertension: Secondary | ICD-10-CM | POA: Insufficient documentation

## 2012-03-07 DIAGNOSIS — R51 Headache: Secondary | ICD-10-CM | POA: Insufficient documentation

## 2012-03-07 DIAGNOSIS — K089 Disorder of teeth and supporting structures, unspecified: Secondary | ICD-10-CM | POA: Insufficient documentation

## 2012-03-07 DIAGNOSIS — D573 Sickle-cell trait: Secondary | ICD-10-CM | POA: Insufficient documentation

## 2012-03-07 DIAGNOSIS — Z79899 Other long term (current) drug therapy: Secondary | ICD-10-CM | POA: Insufficient documentation

## 2012-03-07 DIAGNOSIS — K029 Dental caries, unspecified: Secondary | ICD-10-CM | POA: Insufficient documentation

## 2012-03-07 MED ORDER — HYDROCODONE-ACETAMINOPHEN 5-325 MG PO TABS: 1.0000 | ORAL_TABLET | Freq: Four times a day (QID) | ORAL | Status: AC | PRN

## 2012-03-07 MED ORDER — CLINDAMYCIN HCL 150 MG PO CAPS
300.0000 mg | ORAL_CAPSULE | Freq: Once | ORAL | Status: AC
  Administered 2012-03-07: 300 mg via ORAL
  Filled 2012-03-07: qty 2

## 2012-03-07 MED ORDER — IBUPROFEN 800 MG PO TABS
800.0000 mg | ORAL_TABLET | Freq: Once | ORAL | Status: AC
  Administered 2012-03-07: 800 mg via ORAL
  Filled 2012-03-07: qty 1

## 2012-03-07 MED ORDER — CLINDAMYCIN HCL 150 MG PO CAPS: 300.0000 mg | ORAL_CAPSULE | Freq: Three times a day (TID) | ORAL | Status: DC

## 2012-03-07 MED ORDER — HYDROCODONE-ACETAMINOPHEN 5-325 MG PO TABS
1.0000 | ORAL_TABLET | Freq: Once | ORAL | Status: AC
  Administered 2012-03-07: 1 via ORAL
  Filled 2012-03-07: qty 1

## 2012-03-07 NOTE — ED Provider Notes (Signed)
History     CSN: [id number]  Arrival date & time 03/07/12  9562   First MD Initiated Contact with Patient 03/07/12 1011      Chief Complaint  Patient presents with  . Dental Pain    (Consider location/radiation/quality/duration/timing/severity/associated sxs/prior treatment) HPI Comments: Has known dental problem.  States she will see dr. Blondell Reveal, her dentist on Monday.  Pain in R ear also and R headache.  No fever or chills.  Patient is a 51 y.o. female presenting with tooth pain. The history is provided by the patient. No language interpreter was used.  Dental PainThe primary symptoms include mouth pain and headaches. Primary symptoms do not include dental injury, fever or sore throat. The symptoms began yesterday. The symptoms are worsening. The symptoms occur constantly.  Additional symptoms include: ear pain. Additional symptoms do not include: purulent gums, trismus, facial swelling and trouble swallowing. Associated symptoms comments: R ear pain. Medical issues include: smoking. Medical issues do not include: immunosuppression and periodontal disease.    Past Medical History  Diagnosis Date  . Sickle cell trait   . Hypertension     does not take meds  . Blood dyscrasia     sickle cell trait    Past Surgical History  Procedure Date  . Appendectomy   . Incision and drainage perirectal abscess 12/21/09  . Cesarean section     x 2  . Proctoscopy 10/17/2010    Procedure: PROCTOSCOPY;  Surgeon: Marlane Hatcher;  Location: [facility];  Service: General;  Laterality: N/A;  Rigid Proctoscopy/Possible Fistula in Ano  Procedure ended at 1003  . Therapeutic abortion     x2  . Carpal tunnel release 03/23/2011    Procedure: CARPAL TUNNEL RELEASE;  Surgeon: Darreld Mclean;  Location: [facility];  Service: Orthopedics;  Laterality: Left;  . Carpal tunnel release 05/10/2011    Procedure: CARPAL TUNNEL RELEASE;  Surgeon: Darreld Mclean, MD;  Location: [facility];  Service: Orthopedics;   Laterality: Right;    Family History  Problem Relation Age of Onset  . Kidney failure Daughter   . Sickle cell anemia Daughter   . Sickle cell anemia Son   . Anesthesia problems Neg Hx   . Hypotension Neg Hx   . Malignant hyperthermia Neg Hx   . Pseudochol deficiency Neg Hx     History  Substance Use Topics  . Smoking status: Current Every Day Smoker -- 0.5 packs/day for 30 years    Types: Cigarettes  . Smokeless tobacco: Not on file  . Alcohol Use: No     Comment: one year and three months    OB History    Grav Para Term Preterm Abortions TAB SAB Ect Mult Living   4 2 2  2            Review of Systems  Constitutional: Negative for fever and chills.  HENT: Positive for ear pain and dental problem. Negative for sore throat, facial swelling, trouble swallowing and neck pain.   Neurological: Positive for headaches.  All other systems reviewed and are negative.    Allergies  Penicillins; Penicillins cross reactors; and Tape  Home Medications   Current Outpatient Rx  Name  Route  Sig  Dispense  Refill  . HYDROCODONE-ACETAMINOPHEN 7.5-325 MG PO TABS   Oral   Take 1 tablet by mouth every 6 (six) hours as needed. pain         . IBUPROFEN 600 MG PO TABS   Oral   Take 600  mg by mouth every 6 (six) hours as needed. For pain         . LISINOPRIL 10 MG PO TABS   Oral   Take 10 mg by mouth daily.         Marland Kitchen MEDROXYPROGESTERONE ACETATE 150 MG/ML IM SUSP   Intramuscular   Inject 150 mg into the muscle every 3 (three) months.           Marland Kitchen CLINDAMYCIN HCL 150 MG PO CAPS   Oral   Take 2 capsules (300 mg total) by mouth 3 (three) times daily.   60 capsule   0   . HYDROCODONE-ACETAMINOPHEN 5-325 MG PO TABS   Oral   Take 1 tablet by mouth every 6 (six) hours as needed for pain.   20 tablet   0     BP 125/82  Pulse 76  Temp 98.4 F (36.9 C) (Oral)  Resp 20  Wt 172 lb (78.019 kg)  SpO2 100%  Physical Exam  Nursing note and vitals  reviewed. Constitutional: She is oriented to person, place, and time. She appears well-developed and well-nourished. No distress.  HENT:  Head: Normocephalic and atraumatic.  Mouth/Throat: Uvula is midline and oropharynx is clear and moist. Dental caries present. No uvula swelling.    Eyes: EOM are normal.  Neck: Normal range of motion.  Cardiovascular: Normal rate and regular rhythm.   Pulmonary/Chest: Effort normal.  Abdominal: Soft. She exhibits no distension. There is no tenderness.  Musculoskeletal: Normal range of motion.  Neurological: She is alert and oriented to person, place, and time.  Skin: Skin is warm and dry.  Psychiatric: She has a normal mood and affect. Judgment normal.    ED Course  Procedures (including critical care time)  Labs Reviewed - No data to display No results found.   1. Pain, dental       MDM  rx-clindamycin 300 mg TID x 10 days rx-hydrocodone, 20 Ibuprofen 800 mg TID F/u with your dentist ASAP        Evalina Field, PA 03/07/12 1040

## 2012-03-07 NOTE — ED Provider Notes (Signed)
Medical screening examination/treatment/procedure(s) were performed by non-physician practitioner and as supervising physician I was immediately available for consultation/collaboration.  Cadan Maggart, MD 03/07/12 1438 

## 2012-03-07 NOTE — ED Notes (Signed)
Pt states went to dentist Wednesday to get teeth fixed. Pt states area is painful and swelling.

## 2012-04-30 ENCOUNTER — Encounter (HOSPITAL_COMMUNITY): Payer: Self-pay | Admitting: Emergency Medicine

## 2012-04-30 ENCOUNTER — Emergency Department (HOSPITAL_COMMUNITY): Payer: Medicaid Other

## 2012-04-30 ENCOUNTER — Emergency Department (HOSPITAL_COMMUNITY)
Admission: EM | Admit: 2012-04-30 | Discharge: 2012-04-30 | Disposition: A | Discharge status: Home / Self Care | Payer: Medicaid Other | Attending: Emergency Medicine | Admitting: Emergency Medicine

## 2012-04-30 DIAGNOSIS — Z79899 Other long term (current) drug therapy: Secondary | ICD-10-CM | POA: Insufficient documentation

## 2012-04-30 DIAGNOSIS — K2970 Gastritis, unspecified, without bleeding: Secondary | ICD-10-CM

## 2012-04-30 DIAGNOSIS — I10 Essential (primary) hypertension: Secondary | ICD-10-CM | POA: Insufficient documentation

## 2012-04-30 DIAGNOSIS — Z8639 Personal history of other endocrine, nutritional and metabolic disease: Secondary | ICD-10-CM | POA: Insufficient documentation

## 2012-04-30 DIAGNOSIS — F17200 Nicotine dependence, unspecified, uncomplicated: Secondary | ICD-10-CM | POA: Insufficient documentation

## 2012-04-30 DIAGNOSIS — Z862 Personal history of diseases of the blood and blood-forming organs and certain disorders involving the immune mechanism: Secondary | ICD-10-CM | POA: Insufficient documentation

## 2012-04-30 DIAGNOSIS — Z9089 Acquired absence of other organs: Secondary | ICD-10-CM | POA: Insufficient documentation

## 2012-04-30 DIAGNOSIS — R1013 Epigastric pain: Secondary | ICD-10-CM | POA: Insufficient documentation

## 2012-04-30 DIAGNOSIS — Z9889 Other specified postprocedural states: Secondary | ICD-10-CM | POA: Insufficient documentation

## 2012-04-30 DIAGNOSIS — R0789 Other chest pain: Secondary | ICD-10-CM | POA: Insufficient documentation

## 2012-04-30 HISTORY — DX: Pure hypercholesterolemia, unspecified: E78.00

## 2012-04-30 IMAGING — CR DG CHEST 2V
2 series · 2 of 2 positions shown · non-contrast
Comparison: Two-view chest [DATE].

CLINICAL DATA: Chest pain.  Sickle cell disease.

CHEST - 2 VIEW

[view not recorded (1 of 2)]
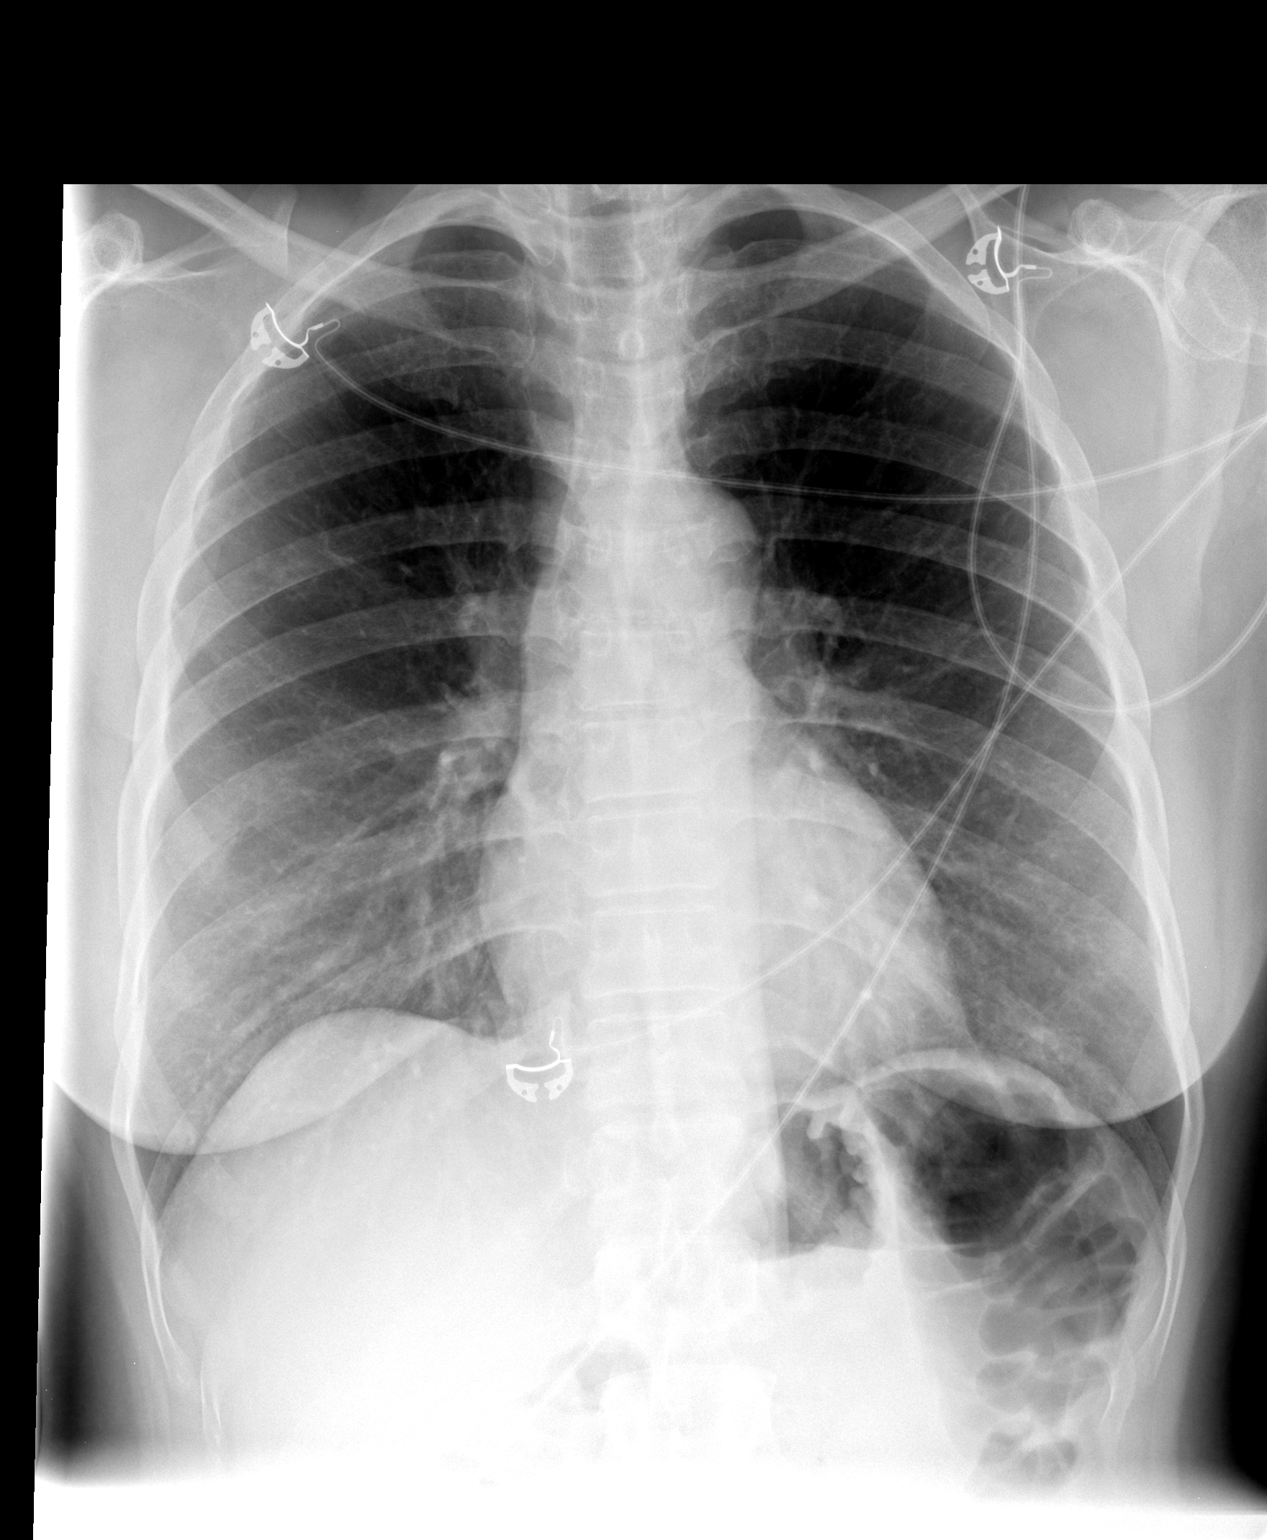

[view not recorded (2 of 2)]
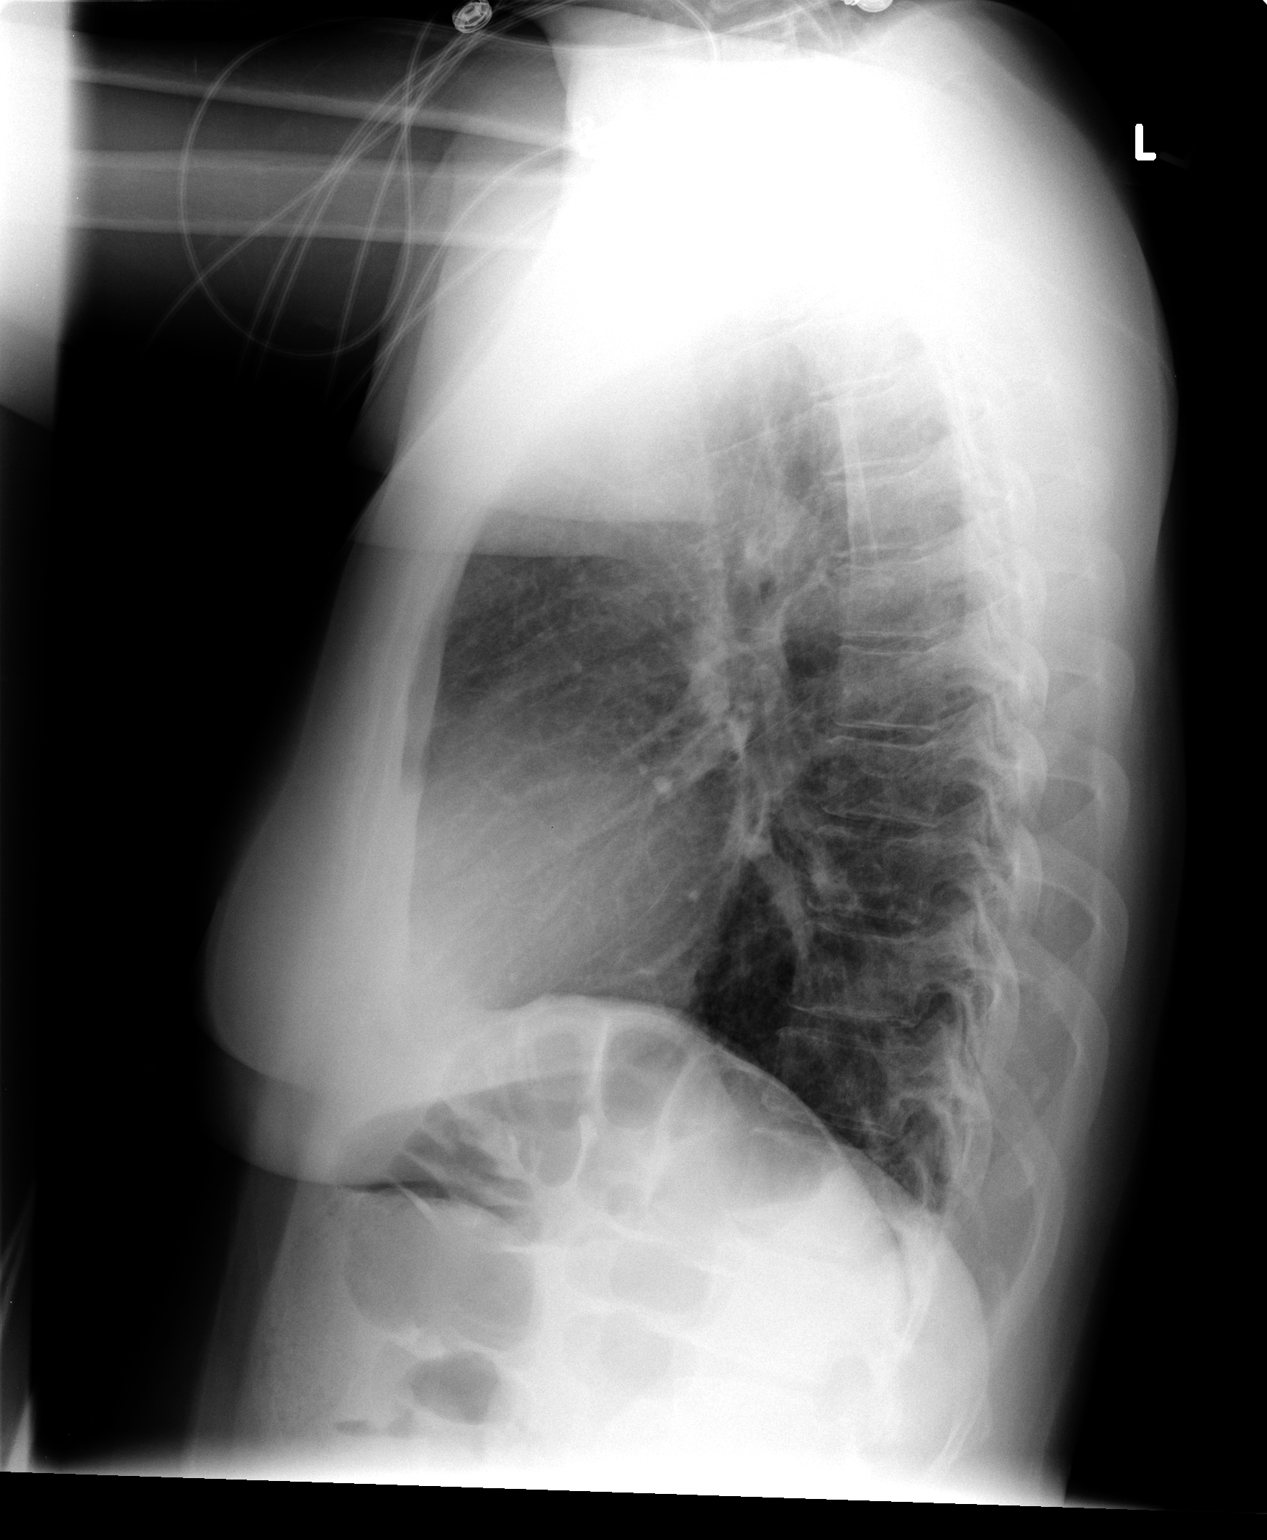

[2 of 2 positions shown; findings below may reference images not displayed]

FINDINGS: The heart size is normal.  Interstitial coarsening is
stable and chronic.  No acute cardiopulmonary disease is present.
Chronic end plate changes are noted within the thoracolumbar spine.
IMPRESSION: 1.  No acute cardiopulmonary disease or significant interval
change.
2.  Stable chronic interstitial coarsening associated with sickle
cell disease.

## 2012-04-30 MED ORDER — GI COCKTAIL ~~LOC~~: 30.0000 mL | Freq: Once | ORAL | Status: DC

## 2012-04-30 MED ORDER — GI COCKTAIL ~~LOC~~
30.0000 mL | Freq: Once | ORAL | Status: AC
  Administered 2012-04-30: 30 mL via ORAL
  Filled 2012-04-30: qty 30

## 2012-04-30 NOTE — ED Notes (Signed)
Pt c/o lower chest pain with radiation around r side back. Took rx ranitidine and helped a little per pt. Nausea. Denies v/d. Denies dizziness/sweating/sob. States had this before and dx with acid reflux. Describes cp as pressure

## 2012-04-30 NOTE — ED Provider Notes (Signed)
History     CSN: [id number]  Arrival date & time 04/30/12  1728   First MD Initiated Contact with Patient 04/30/12 1741      Chief Complaint  Patient presents with  . Chest Pain    (Consider location/radiation/quality/duration/timing/severity/associated sxs/prior treatment) Patient is a 52 y.o. female presenting with chest pain. The history is provided by the patient (the pt complains of burning in chest). No language interpreter was used.  Chest Pain The chest pain began 12 - 24 hours ago. Chest pain occurs constantly. The chest pain is unchanged. At its most intense, the pain is at 4/10. The pain is currently at 3/10. The severity of the pain is moderate. The quality of the pain is described as aching. The pain does not radiate. Chest pain is worsened by eating. Pertinent negatives for primary symptoms include no fever, no fatigue, no cough and no abdominal pain.  Pertinent negatives for past medical history include no seizures.     Past Medical History  Diagnosis Date  . Sickle cell trait   . Hypertension     does not take meds  . Blood dyscrasia     sickle cell trait  . Hypercholesterolemia     Past Surgical History  Procedure Date  . Appendectomy   . Incision and drainage perirectal abscess 12/21/09  . Cesarean section     x 2  . Proctoscopy 10/17/2010    Procedure: PROCTOSCOPY;  Surgeon: Marlane Hatcher;  Location: [facility];  Service: General;  Laterality: N/A;  Rigid Proctoscopy/Possible Fistula in Ano  Procedure ended at 1003  . Therapeutic abortion     x2  . Carpal tunnel release 03/23/2011    Procedure: CARPAL TUNNEL RELEASE;  Surgeon: Darreld Mclean;  Location: [facility];  Service: Orthopedics;  Laterality: Left;  . Carpal tunnel release 05/10/2011    Procedure: CARPAL TUNNEL RELEASE;  Surgeon: Darreld Mclean, MD;  Location: [facility];  Service: Orthopedics;  Laterality: Right;    Family History  Problem Relation Age of Onset  . Kidney failure Daughter   . Sickle  cell anemia Daughter   . Sickle cell anemia Son   . Anesthesia problems Neg Hx   . Hypotension Neg Hx   . Malignant hyperthermia Neg Hx   . Pseudochol deficiency Neg Hx     History  Substance Use Topics  . Smoking status: Current Every Day Smoker -- 0.5 packs/day for 30 years    Types: Cigarettes  . Smokeless tobacco: Not on file  . Alcohol Use: No     Comment: one year and three months    OB History    Grav Para Term Preterm Abortions TAB SAB Ect Mult Living   4 2 2  2            Review of Systems  Constitutional: Negative for fever and fatigue.  HENT: Negative for congestion, sinus pressure and ear discharge.   Eyes: Negative for discharge.  Respiratory: Negative for cough.   Cardiovascular: Positive for chest pain.  Gastrointestinal: Negative for abdominal pain and diarrhea.  Genitourinary: Negative for frequency and hematuria.  Musculoskeletal: Negative for back pain.  Skin: Negative for rash.  Neurological: Negative for seizures and headaches.  Hematological: Negative.   Psychiatric/Behavioral: Negative for hallucinations.    Allergies  Penicillins; Penicillins cross reactors; and Tape  Home Medications   Current Outpatient Rx  Name  Route  Sig  Dispense  Refill  . HYDROCODONE-ACETAMINOPHEN 7.5-325 MG PO TABS   Oral  Take 1 tablet by mouth every 6 (six) hours as needed. pain         . IBUPROFEN 600 MG PO TABS   Oral   Take 600 mg by mouth every 6 (six) hours as needed. For pain         . LISINOPRIL 10 MG PO TABS   Oral   Take 10 mg by mouth daily.         Marland Kitchen MEDROXYPROGESTERONE ACETATE 150 MG/ML IM SUSP   Intramuscular   Inject 150 mg into the muscle every 3 (three) months.             BP 135/78  Pulse 81  Temp 98.1 F (36.7 C) (Oral)  Resp 20  Ht 5\' 9"  (1.753 m)  Wt 164 lb (74.39 kg)  BMI 24.22 kg/m2  SpO2 100%  Physical Exam  Constitutional: She is oriented to person, place, and time. She appears well-developed.  HENT:  Head:  Normocephalic and atraumatic.  Eyes: Conjunctivae normal and EOM are normal. No scleral icterus.  Neck: Neck supple. No thyromegaly present.  Cardiovascular: Normal rate and regular rhythm.  Exam reveals no gallop and no friction rub.   No murmur heard. Pulmonary/Chest: No stridor. She has no wheezes. She has no rales. She exhibits no tenderness.  Abdominal: She exhibits no distension. There is tenderness. There is no rebound.       Tender epigastric  Musculoskeletal: Normal range of motion. She exhibits no edema.  Lymphadenopathy:    She has no cervical adenopathy.  Neurological: She is oriented to person, place, and time. Coordination normal.  Skin: No rash noted. No erythema.  Psychiatric: She has a normal mood and affect. Her behavior is normal.    ED Course  Procedures (including critical care time)  Labs Reviewed - No data to display Dg Chest 2 View  04/30/2012  *RADIOLOGY REPORT*  Clinical Data: Chest pain.  Sickle cell disease.  CHEST - 2 VIEW  Comparison: Two-view chest 02/16/2012.  Findings: The heart size is normal.  Interstitial coarsening is stable and chronic.  No acute cardiopulmonary disease is present. Chronic end plate changes are noted within the thoracolumbar spine.  IMPRESSION:  1.  No acute cardiopulmonary disease or significant interval change. 2.  Stable chronic interstitial coarsening associated with sickle cell disease.   Original Report Authenticated By: Marin Roberts, M.D.      1. Gastritis     Date: 04/30/2012  Rate: 80  Rhythm: normal sinus rhythm  QRS Axis: normal  Intervals: normal  ST/T Wave abnormalities: normal  Conduction Disutrbances:none  Narrative Interpretation:   Old EKG Reviewed: none available    MDM  Pt improved with tx        Benny Lennert, MD 04/30/12 [phone]

## 2012-05-10 ENCOUNTER — Encounter (HOSPITAL_COMMUNITY): Payer: Self-pay

## 2012-05-10 ENCOUNTER — Emergency Department (HOSPITAL_COMMUNITY)
Admission: EM | Admit: 2012-05-10 | Discharge: 2012-05-10 | Disposition: A | Discharge status: Home / Self Care | Payer: Medicaid Other | Attending: Emergency Medicine | Admitting: Emergency Medicine

## 2012-05-10 DIAGNOSIS — Z79899 Other long term (current) drug therapy: Secondary | ICD-10-CM | POA: Insufficient documentation

## 2012-05-10 DIAGNOSIS — E7800 Pure hypercholesterolemia, unspecified: Secondary | ICD-10-CM | POA: Insufficient documentation

## 2012-05-10 DIAGNOSIS — F17200 Nicotine dependence, unspecified, uncomplicated: Secondary | ICD-10-CM | POA: Insufficient documentation

## 2012-05-10 DIAGNOSIS — N750 Cyst of Bartholin's gland: Secondary | ICD-10-CM | POA: Insufficient documentation

## 2012-05-10 DIAGNOSIS — R110 Nausea: Secondary | ICD-10-CM | POA: Insufficient documentation

## 2012-05-10 DIAGNOSIS — I10 Essential (primary) hypertension: Secondary | ICD-10-CM | POA: Insufficient documentation

## 2012-05-10 DIAGNOSIS — Z862 Personal history of diseases of the blood and blood-forming organs and certain disorders involving the immune mechanism: Secondary | ICD-10-CM | POA: Insufficient documentation

## 2012-05-10 MED ORDER — HYDROCODONE-ACETAMINOPHEN 5-325 MG PO TABS: 1.0000 | ORAL_TABLET | ORAL | Status: DC | PRN

## 2012-05-10 MED ORDER — CIPROFLOXACIN HCL 250 MG PO TABS
500.0000 mg | ORAL_TABLET | Freq: Once | ORAL | Status: AC
Start: 2012-05-10 — End: 2012-05-10
  Administered 2012-05-10: 500 mg via ORAL
  Filled 2012-05-10: qty 2

## 2012-05-10 MED ORDER — SULFAMETHOXAZOLE-TRIMETHOPRIM 800-160 MG PO TABS: 1.0000 | ORAL_TABLET | Freq: Two times a day (BID) | ORAL | Status: DC

## 2012-05-10 MED ORDER — SULFAMETHOXAZOLE-TMP DS 800-160 MG PO TABS
1.0000 | ORAL_TABLET | Freq: Once | ORAL | Status: AC
  Administered 2012-05-10: 1 via ORAL
  Filled 2012-05-10: qty 1

## 2012-05-10 MED ORDER — ONDANSETRON 8 MG PO TBDP
8.0000 mg | ORAL_TABLET | Freq: Once | ORAL | Status: AC
  Administered 2012-05-10: 8 mg via ORAL
  Filled 2012-05-10: qty 1

## 2012-05-10 MED ORDER — HYDROCODONE-ACETAMINOPHEN 5-325 MG PO TABS
1.0000 | ORAL_TABLET | Freq: Once | ORAL | Status: AC
  Administered 2012-05-10: 1 via ORAL
  Filled 2012-05-10: qty 1

## 2012-05-10 MED ORDER — CIPROFLOXACIN HCL 500 MG PO TABS: 500.0000 mg | ORAL_TABLET | Freq: Two times a day (BID) | ORAL | Status: DC

## 2012-05-10 NOTE — ED Notes (Signed)
Pt report multiple abcesses to perineal area for 1 week, also having nausea this am

## 2012-05-10 NOTE — ED Notes (Signed)
Patient with no complaints at this time. Respirations even and unlabored. Skin warm/dry. Discharge instructions reviewed with patient at this time. Patient given opportunity to voice concerns/ask questions. Patient discharged at this time and left Emergency Department with steady gait.   

## 2012-05-10 NOTE — ED Provider Notes (Signed)
History    This chart was scribed for Amber Cooper III, MD by Charolett Bumpers, ED Scribe. The patient was seen in room [facility]. Patient's care was started at 0707.   CSN: [id number]  Arrival date & time 05/10/12  4098   First MD Initiated Contact with Patient 05/10/12 [phone]      Chief Complaint  Patient presents with  . Wound Check    The history is provided by the patient. No language interpreter was used.   Amber Drake is a 52 y.o. female who presents to the Emergency Department complaining of a gradually worsening, severely painful abscess to her left labia that she noticed this morning. She has a h/o similar abscess in the past and reports she has multiple abscess to her perineal area. She reports she has had an I&D preformed by Dr. Malvin Johns on her abscesses 4 years ago, but states she has reoccurring flare ups. She also reports associated nausea. She states that she is otherwise normally healthy and takes HTN medication. She has a surgical hx of appendectomy and 2 cesareans. She is allergic to penicillin. She admits to smoking 1/2 pack daily.   Past Medical History  Diagnosis Date  . Sickle cell trait   . Hypertension     does not take meds  . Blood dyscrasia     sickle cell trait  . Hypercholesterolemia     Past Surgical History  Procedure Laterality Date  . Appendectomy    . Incision and drainage perirectal abscess  12/21/09  . Cesarean section      x 2  . Proctoscopy  10/17/2010    Procedure: PROCTOSCOPY;  Surgeon: Marlane Hatcher;  Location: [facility];  Service: General;  Laterality: N/A;  Rigid Proctoscopy/Possible Fistula in Ano  Procedure ended at 1003  . Therapeutic abortion      x2  . Carpal tunnel release  03/23/2011    Procedure: CARPAL TUNNEL RELEASE;  Surgeon: Darreld Mclean;  Location: [facility];  Service: Orthopedics;  Laterality: Left;  . Carpal tunnel release  05/10/2011    Procedure: CARPAL TUNNEL RELEASE;  Surgeon: Darreld Mclean, MD;  Location:  [facility];  Service: Orthopedics;  Laterality: Right;    Family History  Problem Relation Age of Onset  . Kidney failure Daughter   . Sickle cell anemia Daughter   . Sickle cell anemia Son   . Anesthesia problems Neg Hx   . Hypotension Neg Hx   . Malignant hyperthermia Neg Hx   . Pseudochol deficiency Neg Hx     History  Substance Use Topics  . Smoking status: Current Every Day Smoker -- 0.50 packs/day for 30 years    Types: Cigarettes  . Smokeless tobacco: Not on file  . Alcohol Use: No     Comment: one year and three months    OB History   Grav Para Term Preterm Abortions TAB SAB Ect Mult Living   4 2 2  2            Review of Systems  Gastrointestinal: Positive for nausea. Negative for vomiting.  Skin: Positive for wound. Negative for rash.  All other systems reviewed and are negative.    Allergies  Penicillins; Penicillins cross reactors; and Tape  Home Medications   Current Outpatient Rx  Name  Route  Sig  Dispense  Refill  . HYDROcodone-acetaminophen (NORCO) 7.5-325 MG per tablet   Oral   Take 1 tablet by mouth every 6 (six) hours as needed. pain         .  ibuprofen (ADVIL,MOTRIN) 600 MG tablet   Oral   Take 600 mg by mouth every 6 (six) hours as needed. For pain         . lisinopril (PRINIVIL,ZESTRIL) 10 MG tablet   Oral   Take 10 mg by mouth daily.         . medroxyPROGESTERone (DEPO-PROVERA) 150 MG/ML injection   Intramuscular   Inject 150 mg into the muscle every 3 (three) months.             Triage Vitals: BP 123/71  Pulse 88  Temp(Src) 98.8 F (37.1 C) (Oral)  Resp 18  SpO2 99%  Physical Exam  Nursing note and vitals reviewed. Constitutional: She is oriented to person, place, and time. She appears well-developed and well-nourished. No distress.  HENT:  Head: Normocephalic and atraumatic.  Eyes: EOM are normal. Pupils are equal, round, and reactive to light.  Neck: Normal range of motion. Neck supple. No tracheal deviation  present.  Cardiovascular: Normal rate.   Pulmonary/Chest: Effort normal. No respiratory distress.  Abdominal: Soft. She exhibits no distension.  Genitourinary:  On the left labia majora, inflammed 1.5 cm in diameter nodule that is firm but not fluctuant. Nodule is very tender to palpation. No drainage noted. Chaperon present during exam.  Musculoskeletal: Normal range of motion. She exhibits no edema.  Lymphadenopathy:       Left: Inguinal adenopathy present.  Tender left inguinal lymph node that is 1 cm in diameter.  Neurological: She is alert and oriented to person, place, and time.  Skin: Skin is warm and dry.  Psychiatric: She has a normal mood and affect. Her behavior is normal.    ED Course  Procedures (including critical care time)  DIAGNOSTIC STUDIES: Oxygen Saturation is 99% on room air, normal by my interpretation.    COORDINATION OF CARE:  07:17-Discussed planned course of treatment with the patient including starting pt on antibiotics and pain management, who is agreeable at this time. Advised to take warm sit baths to help with pain.  07:30-Medication Orders: Ciprofloxacin (Cipro) tablet 500 mg-once; Sulfamethoxazole-trimethoprim (Bactrim DS) 800-160 mg per tablet 1 tablet-once; Hydrocodone-acetaminophen (Norco/Vicodin) 5-325 mg per tablet 1 tablet-once.   Will d/c pt home with prescriptions for the same.       1. Infected cyst of Bartholin's gland duct    I personally performed the services described in this documentation, which was scribed in my presence. The recorded information has been reviewed and is accurate. Osvaldo Human, MD      Amber Cooper III, MD 05/10/12 [phone]

## 2012-08-17 ENCOUNTER — Emergency Department (HOSPITAL_COMMUNITY)
Admission: EM | Admit: 2012-08-17 | Discharge: 2012-08-17 | Disposition: A | Discharge status: Home / Self Care | Payer: Medicaid Other | Attending: Emergency Medicine | Admitting: Emergency Medicine

## 2012-08-17 ENCOUNTER — Encounter (HOSPITAL_COMMUNITY): Payer: Self-pay | Admitting: *Deleted

## 2012-08-17 DIAGNOSIS — M79609 Pain in unspecified limb: Secondary | ICD-10-CM | POA: Insufficient documentation

## 2012-08-17 DIAGNOSIS — Z79899 Other long term (current) drug therapy: Secondary | ICD-10-CM | POA: Insufficient documentation

## 2012-08-17 DIAGNOSIS — Z862 Personal history of diseases of the blood and blood-forming organs and certain disorders involving the immune mechanism: Secondary | ICD-10-CM | POA: Insufficient documentation

## 2012-08-17 DIAGNOSIS — M62838 Other muscle spasm: Secondary | ICD-10-CM

## 2012-08-17 DIAGNOSIS — Z880 Allergy status to penicillin: Secondary | ICD-10-CM | POA: Insufficient documentation

## 2012-08-17 DIAGNOSIS — Z8639 Personal history of other endocrine, nutritional and metabolic disease: Secondary | ICD-10-CM | POA: Insufficient documentation

## 2012-08-17 DIAGNOSIS — I10 Essential (primary) hypertension: Secondary | ICD-10-CM | POA: Insufficient documentation

## 2012-08-17 DIAGNOSIS — M5412 Radiculopathy, cervical region: Secondary | ICD-10-CM

## 2012-08-17 DIAGNOSIS — F17200 Nicotine dependence, unspecified, uncomplicated: Secondary | ICD-10-CM | POA: Insufficient documentation

## 2012-08-17 MED ORDER — HYDROCODONE-ACETAMINOPHEN 5-325 MG PO TABS: 1.0000 | ORAL_TABLET | ORAL | Status: DC | PRN

## 2012-08-17 MED ORDER — CYCLOBENZAPRINE HCL 5 MG PO TABS: 5.0000 mg | ORAL_TABLET | Freq: Three times a day (TID) | ORAL | Status: DC | PRN
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
## 2012-08-17 NOTE — ED Provider Notes (Signed)
Medical screening examination/treatment/procedure(s) were performed by non-physician practitioner and as supervising physician I was immediately available for consultation/collaboration.   Joya Gaskins, MD 08/17/12 [phone]

## 2012-08-17 NOTE — ED Notes (Signed)
Pt co bilateral hand pain, has HX of carpal tunnel syndrome and has had surgery for same. Pt states headache come and goes, associated with burning/pain in neck, both currently resolved.

## 2012-08-17 NOTE — ED Notes (Signed)
Pt alert & oriented x4, stable gait. Patient given discharge instructions, paperwork & prescription(s). Patient  instructed to stop at the registration desk to finish any additional paperwork. Patient verbalized understanding. Pt left department w/ no further questions. 

## 2012-08-17 NOTE — ED Notes (Signed)
Pt c/o headache, bilateral hand pain, nausea, right side neck pain  that started yesterday.

## 2012-08-23 ENCOUNTER — Emergency Department (HOSPITAL_COMMUNITY): Payer: Medicaid Other

## 2012-08-23 ENCOUNTER — Emergency Department (HOSPITAL_COMMUNITY)
Admission: EM | Admit: 2012-08-23 | Discharge: 2012-08-23 | Disposition: A | Discharge status: Home / Self Care | Payer: Medicaid Other | Attending: Emergency Medicine | Admitting: Emergency Medicine

## 2012-08-23 ENCOUNTER — Encounter (HOSPITAL_COMMUNITY): Payer: Self-pay | Admitting: *Deleted

## 2012-08-23 DIAGNOSIS — R079 Chest pain, unspecified: Secondary | ICD-10-CM | POA: Insufficient documentation

## 2012-08-23 DIAGNOSIS — Z880 Allergy status to penicillin: Secondary | ICD-10-CM | POA: Insufficient documentation

## 2012-08-23 DIAGNOSIS — I10 Essential (primary) hypertension: Secondary | ICD-10-CM | POA: Insufficient documentation

## 2012-08-23 DIAGNOSIS — R110 Nausea: Secondary | ICD-10-CM | POA: Insufficient documentation

## 2012-08-23 DIAGNOSIS — Z9089 Acquired absence of other organs: Secondary | ICD-10-CM | POA: Insufficient documentation

## 2012-08-23 DIAGNOSIS — M542 Cervicalgia: Secondary | ICD-10-CM | POA: Insufficient documentation

## 2012-08-23 DIAGNOSIS — R0602 Shortness of breath: Secondary | ICD-10-CM | POA: Insufficient documentation

## 2012-08-23 DIAGNOSIS — Z8639 Personal history of other endocrine, nutritional and metabolic disease: Secondary | ICD-10-CM | POA: Insufficient documentation

## 2012-08-23 DIAGNOSIS — Z862 Personal history of diseases of the blood and blood-forming organs and certain disorders involving the immune mechanism: Secondary | ICD-10-CM | POA: Insufficient documentation

## 2012-08-23 DIAGNOSIS — Z79899 Other long term (current) drug therapy: Secondary | ICD-10-CM | POA: Insufficient documentation

## 2012-08-23 DIAGNOSIS — R142 Eructation: Secondary | ICD-10-CM | POA: Insufficient documentation

## 2012-08-23 DIAGNOSIS — M549 Dorsalgia, unspecified: Secondary | ICD-10-CM | POA: Insufficient documentation

## 2012-08-23 DIAGNOSIS — R141 Gas pain: Secondary | ICD-10-CM | POA: Insufficient documentation

## 2012-08-23 DIAGNOSIS — R42 Dizziness and giddiness: Secondary | ICD-10-CM | POA: Insufficient documentation

## 2012-08-23 DIAGNOSIS — R1013 Epigastric pain: Secondary | ICD-10-CM | POA: Insufficient documentation

## 2012-08-23 DIAGNOSIS — F17200 Nicotine dependence, unspecified, uncomplicated: Secondary | ICD-10-CM | POA: Insufficient documentation

## 2012-08-23 LAB — CBC WITH DIFFERENTIAL/PLATELET
Basophils Absolute: 0 10*3/uL (ref 0.0–0.1)
Basophils Relative: 0 % (ref 0–1)
Eosinophils Absolute: 0.1 10*3/uL (ref 0.0–0.7)
MCH: 29.1 pg (ref 26.0–34.0)
MCHC: 34.9 g/dL (ref 30.0–36.0)
Neutrophils Relative %: 63 % (ref 43–77)
Platelets: 348 10*3/uL (ref 150–400)
RDW: 12.7 % (ref 11.5–15.5)

## 2012-08-23 LAB — COMPREHENSIVE METABOLIC PANEL
AST: 16 U/L (ref 0–37)
Albumin: 3.3 g/dL — ABNORMAL LOW (ref 3.5–5.2)
Alkaline Phosphatase: 64 U/L (ref 39–117)
BUN: 10 mg/dL (ref 6–23)
Potassium: 3.3 mEq/L — ABNORMAL LOW (ref 3.5–5.1)
Sodium: 139 mEq/L (ref 135–145)
Total Protein: 7.2 g/dL (ref 6.0–8.3)

## 2012-08-23 LAB — LIPASE, BLOOD: Lipase: 26 U/L (ref 11–59)

## 2012-08-23 IMAGING — CR DG CHEST 2V
2 series · 2 of 2 positions shown · non-contrast
Comparison: [DATE]

CLINICAL DATA: Epigastric pain.  Chest pain.

CHEST - 2 VIEW

[view not recorded (1 of 2)]
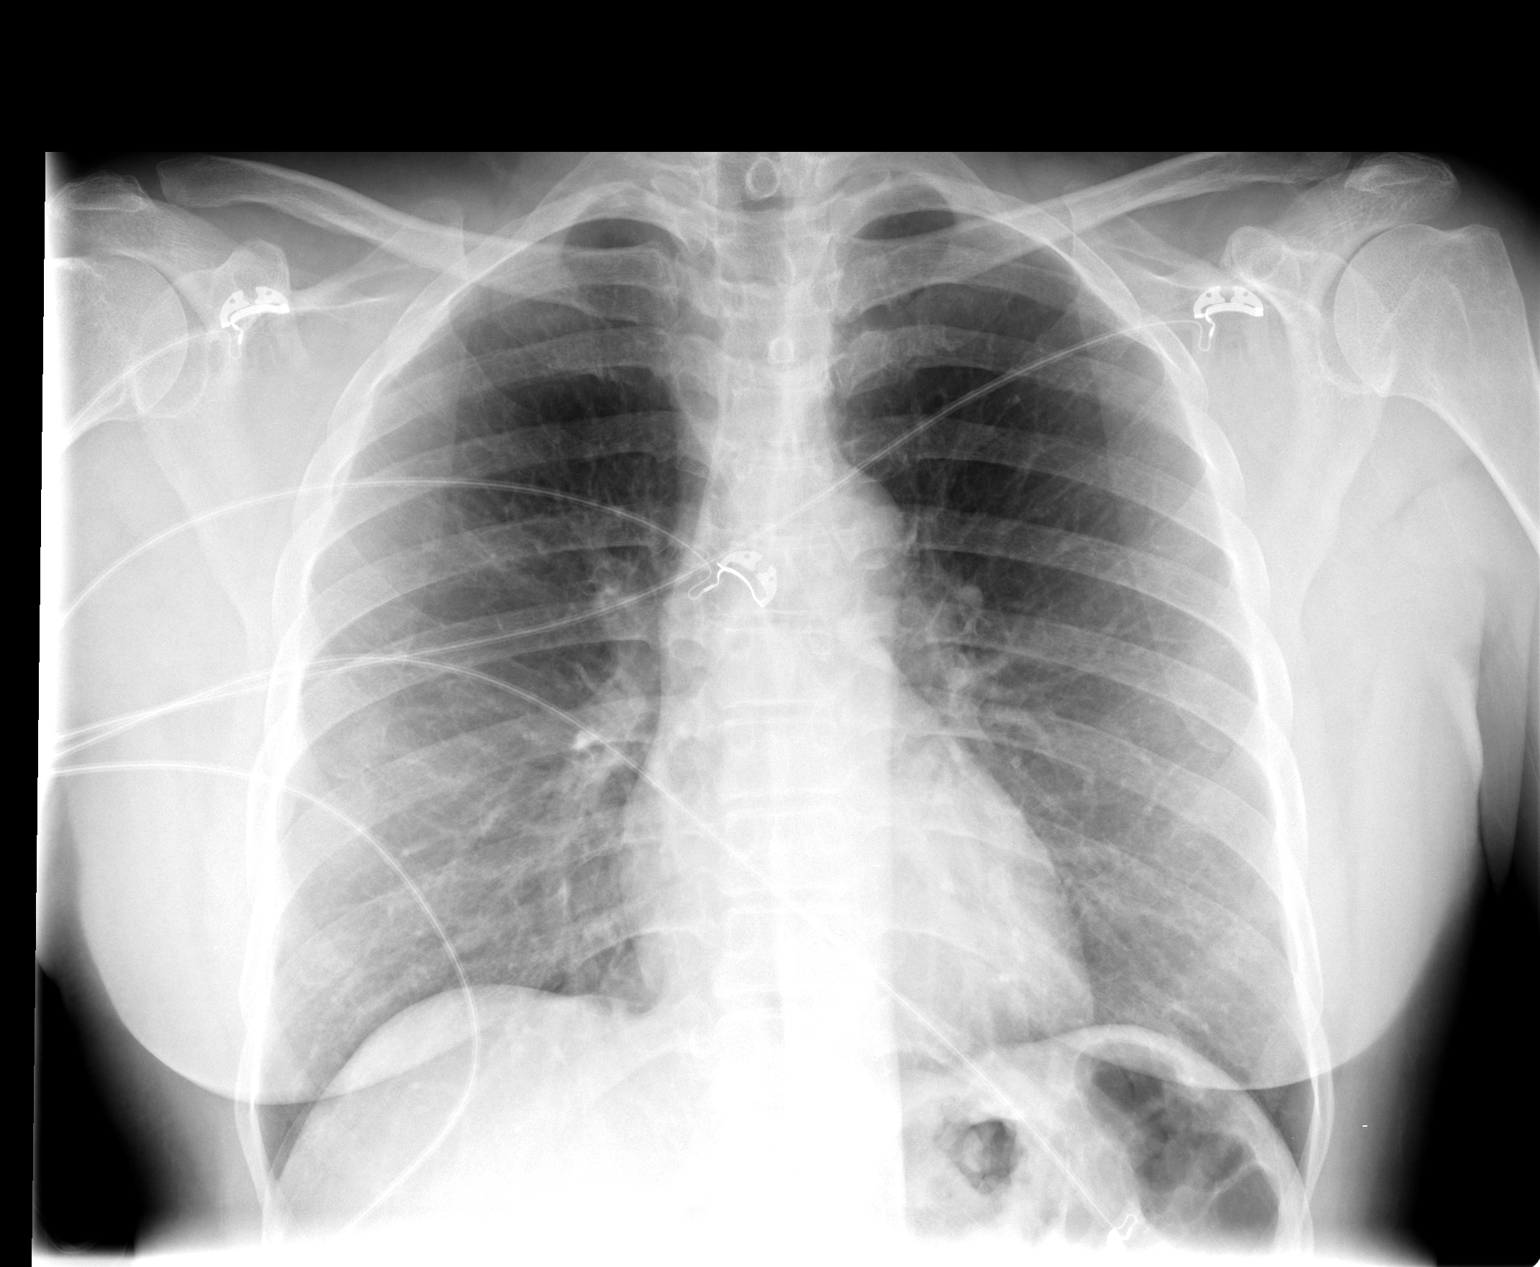

[view not recorded (2 of 2)]
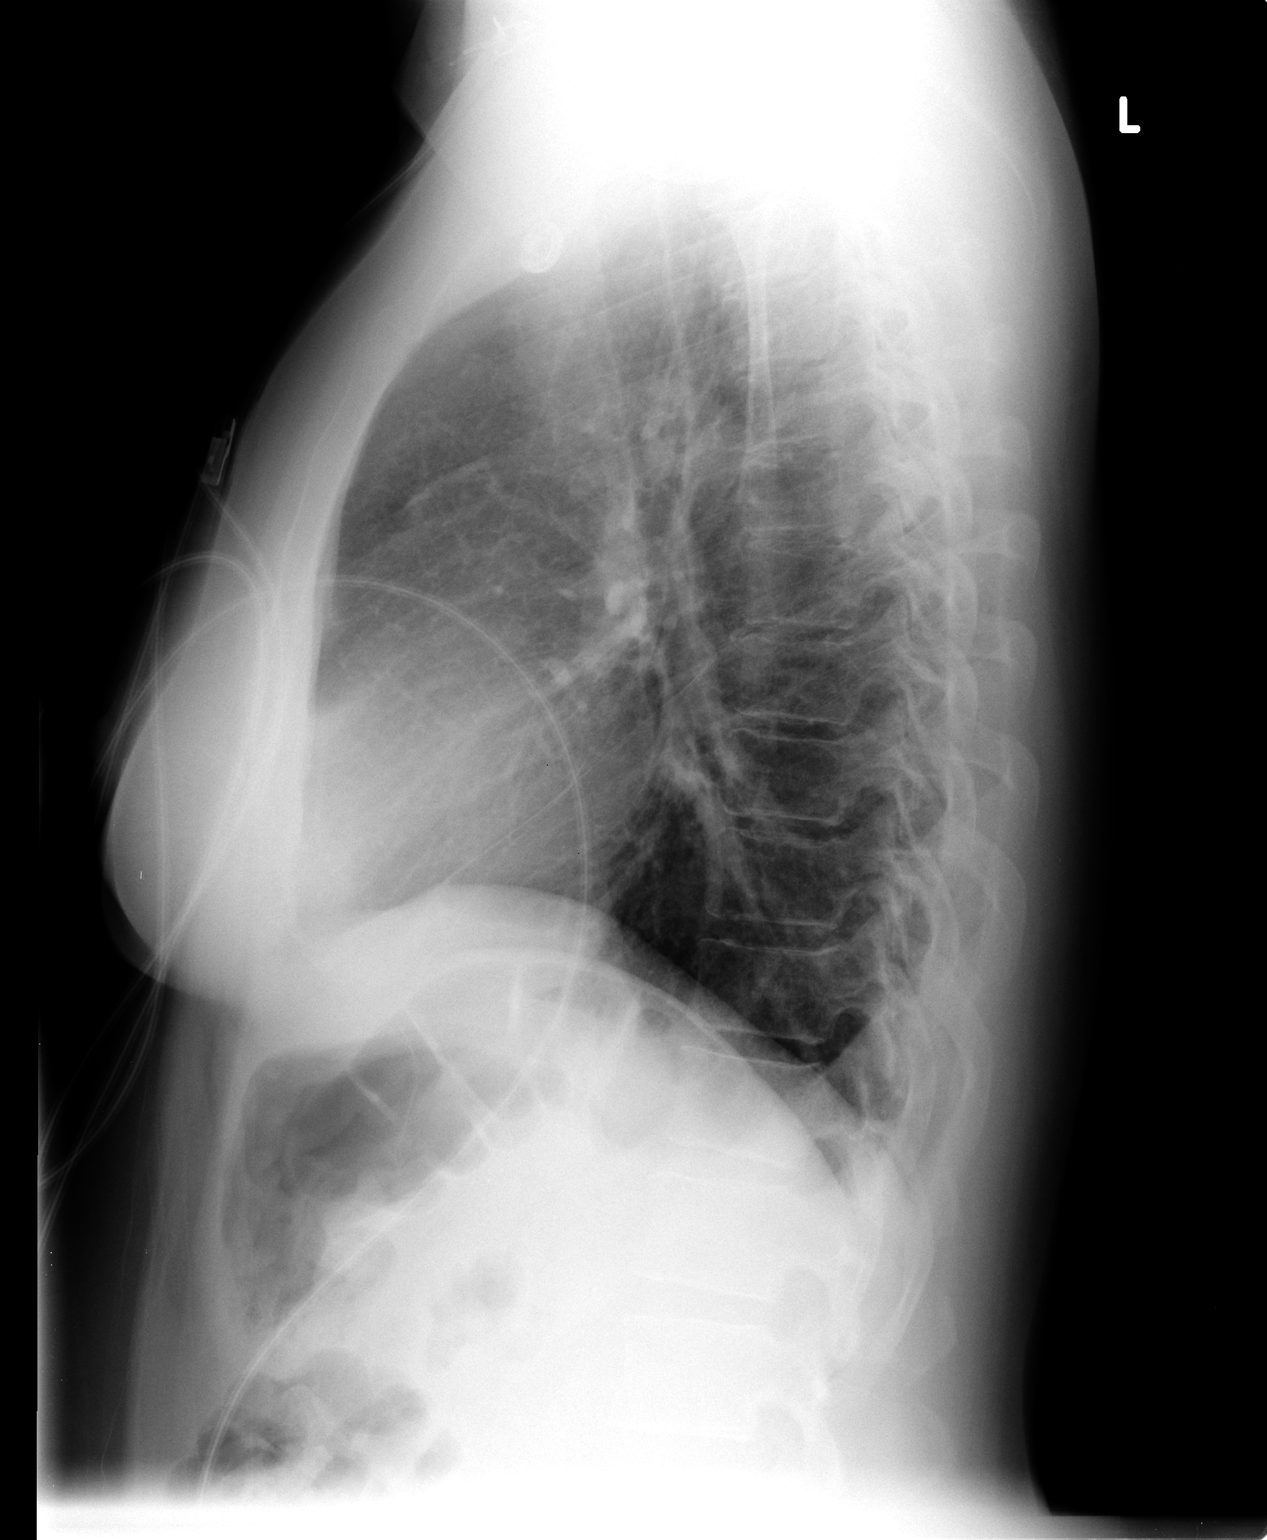

[2 of 2 positions shown; findings below may reference images not displayed]

FINDINGS: Normal heart size. Stable mild interstitial changes.
Mild hyperaeration.
IMPRESSION: No active cardiopulmonary disease.  Chronic changes.

## 2012-08-23 MED ORDER — ONDANSETRON HCL 4 MG/2ML IJ SOLN
4.0000 mg | Freq: Once | INTRAMUSCULAR | Status: AC
  Administered 2012-08-23: 4 mg via INTRAVENOUS
  Filled 2012-08-23: qty 2

## 2012-08-23 MED ORDER — GI COCKTAIL ~~LOC~~
30.0000 mL | Freq: Once | ORAL | Status: AC
  Administered 2012-08-23: 30 mL via ORAL
  Filled 2012-08-23: qty 30

## 2012-08-23 MED ORDER — OXYCODONE-ACETAMINOPHEN 5-325 MG PO TABS
ORAL_TABLET | ORAL | Status: AC
  Administered 2012-08-23: 1 via ORAL
  Filled 2012-08-23: qty 1

## 2012-08-23 MED ORDER — FAMOTIDINE IN NACL 20-0.9 MG/50ML-% IV SOLN
20.0000 mg | Freq: Once | INTRAVENOUS | Status: AC
  Administered 2012-08-23: 20 mg via INTRAVENOUS
  Filled 2012-08-23: qty 50

## 2012-08-23 MED ORDER — HYDROCODONE-ACETAMINOPHEN 5-325 MG PO TABS: 1.0000 | ORAL_TABLET | ORAL | Status: DC | PRN

## 2012-08-23 MED ORDER — OXYCODONE-ACETAMINOPHEN 5-325 MG PO TABS
1.0000 | ORAL_TABLET | Freq: Once | ORAL | Status: AC
  Administered 2012-08-23: 1 via ORAL

## 2012-08-23 NOTE — ED Notes (Signed)
Pt presents to er with pain that starts lower rib cage area and radiates to mid center chest, bilateral shoulder and back area, pain is described as sharp, did get better with tums, pain started two days ago, has been intermittent, has had same type pain in the past and was told that it was acid reflux.

## 2012-08-23 NOTE — ED Provider Notes (Signed)
History     CSN: [id number]  Arrival date & time 08/23/12  0801   First MD Initiated Contact with Patient 08/23/12 0815      Chief Complaint  Patient presents with  . Chest Pain    (Consider location/radiation/quality/duration/timing/severity/associated sxs/prior treatment) Patient is a 52 y.o. female presenting with chest pain. The history is provided by the patient.  Chest Pain Pain location:  Epigastric Pain quality: aching and radiating   Pain radiates to:  Upper back, L shoulder, R shoulder and neck Pain radiates to the back: yes   Pain severity:  Moderate Onset quality:  Gradual Duration:  2 days Timing:  Intermittent Progression:  Waxing and waning Chronicity:  Recurrent Context: eating   Relieved by:  Antacids Exacerbated by: eating. Associated symptoms: back pain, heartburn, nausea and shortness of breath   Associated symptoms: no fever, no headache, no palpitations, no syncope and not vomiting  Abdominal pain: epigastric.   Risk factors: high cholesterol, hypertension and smoking   Risk factors: no coronary artery disease and no diabetes mellitus    Amber Drake is a 52 y.o. female who presents to the ED with epigastric pain. The pain started 2 days ago. She ran out of her Zantac about a month ago. She has had a similar episode to this about 3 months ago. Some foods make the symptoms worse.   Past Medical History  Diagnosis Date  . Sickle cell trait   . Hypertension     does not take meds  . Blood dyscrasia     sickle cell trait  . Hypercholesterolemia     Past Surgical History  Procedure Laterality Date  . Appendectomy    . Incision and drainage perirectal abscess  12/21/09  . Cesarean section      x 2  . Proctoscopy  10/17/2010    Procedure: PROCTOSCOPY;  Surgeon: Marlane Hatcher;  Location: [facility];  Service: General;  Laterality: N/A;  Rigid Proctoscopy/Possible Fistula in Ano  Procedure ended at 1003  . Therapeutic abortion      x2  . Carpal  tunnel release  03/23/2011    Procedure: CARPAL TUNNEL RELEASE;  Surgeon: Darreld Mclean;  Location: [facility];  Service: Orthopedics;  Laterality: Left;  . Carpal tunnel release  05/10/2011    Procedure: CARPAL TUNNEL RELEASE;  Surgeon: Darreld Mclean, MD;  Location: [facility];  Service: Orthopedics;  Laterality: Right;    Family History  Problem Relation Age of Onset  . Kidney failure Daughter   . Sickle cell anemia Daughter   . Sickle cell anemia Son   . Anesthesia problems Neg Hx   . Hypotension Neg Hx   . Malignant hyperthermia Neg Hx   . Pseudochol deficiency Neg Hx     History  Substance Use Topics  . Smoking status: Current Every Day Smoker -- 0.50 packs/day for 30 years    Types: Cigarettes  . Smokeless tobacco: Not on file  . Alcohol Use: No     Comment: one year and three months    OB History   Grav Para Term Preterm Abortions TAB SAB Ect Mult Living   4 2 2  2            Review of Systems  Constitutional: Negative for fever and chills.  HENT: Positive for neck pain.   Eyes: Negative for visual disturbance.  Respiratory: Positive for shortness of breath. Negative for wheezing.   Cardiovascular: Positive for chest pain. Negative for palpitations, leg swelling and  syncope.  Gastrointestinal: Positive for heartburn and nausea. Negative for vomiting, diarrhea and constipation. Abdominal pain: epigastric.  Musculoskeletal: Positive for back pain. Negative for joint swelling.  Skin: Negative for rash.  Allergic/Immunologic: Negative for environmental allergies and food allergies.  Neurological: Positive for light-headedness. Negative for syncope and headaches.  Psychiatric/Behavioral: Negative for confusion. The patient is not nervous/anxious.     Allergies  Penicillins; Penicillins cross reactors; and Tape  Home Medications   Current Outpatient Rx  Name  Route  Sig  Dispense  Refill  . cyclobenzaprine (FLEXERIL) 5 MG tablet   Oral   Take 1 tablet (5 mg total) by  mouth 3 (three) times daily as needed for muscle spasms.   15 tablet   0   . HYDROcodone-acetaminophen (NORCO/VICODIN) 5-325 MG per tablet   Oral   Take 1 tablet by mouth every 4 (four) hours as needed for pain.         Marland Kitchen lisinopril (PRINIVIL,ZESTRIL) 10 MG tablet   Oral   Take 10 mg by mouth daily.         . Multiple Vitamin (MULTIVITAMIN WITH MINERALS) TABS   Oral   Take 1 tablet by mouth daily.         Marland Kitchen acetaminophen (TYLENOL) 500 MG tablet   Oral   Take 1,000 mg by mouth every 6 (six) hours as needed for pain.         Marland Kitchen ibuprofen (ADVIL,MOTRIN) 600 MG tablet   Oral   Take 600 mg by mouth every 6 (six) hours as needed. For pain         . medroxyPROGESTERone (DEPO-PROVERA) 150 MG/ML injection   Intramuscular   Inject 150 mg into the muscle every 3 (three) months.             BP 123/80  Pulse 66  Temp(Src) 98.4 F (36.9 C) (Oral)  Resp 20  SpO2 100%  Physical Exam  Nursing note and vitals reviewed. Constitutional: She is oriented to person, place, and time. She appears well-developed and well-nourished. No distress.  HENT:  Head: Normocephalic and atraumatic.  Eyes: EOM are normal.  Neck: Normal range of motion. Neck supple.  Cardiovascular: Normal rate, regular rhythm and normal heart sounds.   Pulmonary/Chest: Effort normal and breath sounds normal.  Abdominal: Soft. Bowel sounds are normal. She exhibits no shifting dullness and no distension. There is tenderness in the right upper quadrant, epigastric area and left upper quadrant. There is no rigidity, no rebound, no guarding and no CVA tenderness.  Musculoskeletal: Normal range of motion. She exhibits no edema.  Neurological: She is alert and oriented to person, place, and time. No cranial nerve deficit.  Skin: Skin is warm and dry.  Psychiatric: She has a normal mood and affect. Her behavior is normal. Judgment and thought content normal.    ED Course  Procedures (including critical care  time) Results for orders placed during the hospital encounter of 08/23/12 (from the past 24 hour(s))  CBC WITH DIFFERENTIAL     Status: Abnormal   Collection Time    08/23/12  8:32 AM      Result Value Range   WBC 6.8  4.0 - 10.5 K/uL   RBC 3.92  3.87 - 5.11 MIL/uL   Hemoglobin 11.4 (*) 12.0 - 15.0 g/dL   HCT 47.8 (*) 29.5 - 62.1 %   MCV 83.4  78.0 - 100.0 fL   MCH 29.1  26.0 - 34.0 pg   MCHC 34.9  30.0 - 36.0 g/dL   RDW 98.1  19.1 - 47.8 %   Platelets 348  150 - 400 K/uL   Neutrophils Relative % 63  43 - 77 %   Neutro Abs 4.3  1.7 - 7.7 K/uL   Lymphocytes Relative 31  12 - 46 %   Lymphs Abs 2.1  0.7 - 4.0 K/uL   Monocytes Relative 5  3 - 12 %   Monocytes Absolute 0.4  0.1 - 1.0 K/uL   Eosinophils Relative 1  0 - 5 %   Eosinophils Absolute 0.1  0.0 - 0.7 K/uL   Basophils Relative 0  0 - 1 %   Basophils Absolute 0.0  0.0 - 0.1 K/uL  COMPREHENSIVE METABOLIC PANEL     Status: Abnormal   Collection Time    08/23/12  8:32 AM      Result Value Range   Sodium 139  135 - 145 mEq/L   Potassium 3.3 (*) 3.5 - 5.1 mEq/L   Chloride 104  96 - 112 mEq/L   CO2 26  19 - 32 mEq/L   Glucose, Bld 92  70 - 99 mg/dL   BUN 10  6 - 23 mg/dL   Creatinine, Ser 2.95  0.50 - 1.10 mg/dL   Calcium 9.7  8.4 - 62.1 mg/dL   Total Protein 7.2  6.0 - 8.3 g/dL   Albumin 3.3 (*) 3.5 - 5.2 g/dL   AST 16  0 - 37 U/L   ALT 11  0 - 35 U/L   Alkaline Phosphatase 64  39 - 117 U/L   Total Bilirubin 0.2 (*) 0.3 - 1.2 mg/dL   GFR calc non Af Amer >90  >90 mL/min   GFR calc Af Amer >90  >90 mL/min  LIPASE, BLOOD     Status: None   Collection Time    08/23/12  8:32 AM      Result Value Range   Lipase 26  11 - 59 U/L  POCT I-STAT TROPONIN I     Status: None   Collection Time    08/23/12  8:33 AM      Result Value Range   Troponin i, poc 0.00  0.00 - 0.08 ng/mL   Comment 3             Dg Chest 2 View  08/23/2012   *RADIOLOGY REPORT*  Clinical Data: Epigastric pain.  Chest pain.  CHEST - 2 VIEW  Comparison:  04/30/2012  Findings: Normal heart size. Stable mild interstitial changes. Mild hyperaeration.  IMPRESSION: No active cardiopulmonary disease.  Chronic changes.   Original Report Authenticated By: Jolaine Click, M.D.   US Abdomen Complete  08/23/2012   *RADIOLOGY REPORT*  Clinical Data:  Epigastric pain.  Prior appendectomy.  COMPLETE ABDOMINAL ULTRASOUND  Comparison:  CT of the abdomen and pelvis 02/16/2012  Findings:  Gallbladder:  Multiple falls are identified within the gallbladder. No stones are identified.  No pericholecystic fluid.  Gallbladder wall is normal in thickness, 1.3 mm.  No sonographic Murphy's sign.  Common bile duct:  4.3 mm  Liver:  No focal mass.  Normal echotexture.  IVC:  Appears normal.  Pancreas:  The pancreas is not well seen because of overlying bowel gas.  Spleen:  Normal in appearance, 4.8 cm.  Right Kidney:  Normal in appearance, 9.2 cm.  Left Kidney:  Normal appearance, 5.3 cm.  Abdominal aorta:  Not aneurysmal, 2.4 cm.  IMPRESSION:  1.  No evidence for acute cholecystitis.  2.  No evidence for acute abnormality of the abdomen.   Original Report Authenticated By: Norva Pavlov, M.D.     After IV Pepcid and Zofran patient felt better for a while but then started hurting again. Gave GI cocktail and patient could tell no difference. Gave Percocet and patient states pain completely gone and she is ready to go home. She will follow up with Dr. Felecia Shelling. MDM  EKG: normal EKG, normal sinus rhythm.  52 y.o. female with nausea and epigastric pain that has resolved. Labs, EKG x-ray and ultrasound are normal. No findings concerning for MI. Patient stable for discharge home.    Medication List    TAKE these medications       HYDROcodone-acetaminophen 5-325 MG per tablet  Commonly known as:  NORCO/VICODIN  Take 1 tablet by mouth every 4 (four) hours as needed.      ASK your doctor about these medications       acetaminophen 500 MG tablet  Commonly known as:  TYLENOL  Take 1,000  mg by mouth every 6 (six) hours as needed for pain.     cyclobenzaprine 5 MG tablet  Commonly known as:  FLEXERIL  Take 1 tablet (5 mg total) by mouth 3 (three) times daily as needed for muscle spasms.     ibuprofen 600 MG tablet  Commonly known as:  ADVIL,MOTRIN  Take 600 mg by mouth every 6 (six) hours as needed. For pain     lisinopril 10 MG tablet  Commonly known as:  PRINIVIL,ZESTRIL  Take 10 mg by mouth daily.     medroxyPROGESTERone 150 MG/ML injection  Commonly known as:  DEPO-PROVERA  Inject 150 mg into the muscle every 3 (three) months.     multivitamin with minerals Tabs  Take 1 tablet by mouth daily.               [place], [place] 08/23/12 [phone]

## 2012-08-23 NOTE — ED Notes (Signed)
Patient with no complaints at this time. Respirations even and unlabored. Skin warm/dry. Discharge instructions reviewed with patient at this time. Patient given opportunity to voice concerns/ask questions. Patient discharged at this time and left Emergency Department with steady gait.   

## 2012-08-23 NOTE — ED Notes (Signed)
C/O aching pain radiating under breasts bilaterally, epigastric region, mid chest, shoulders and upper back.  Pain began after eating 2-3 days ago, relieved shortly by use of TUMS.  Has hx of GERD w/Zantac use.  Ran out of medication 1 month ago.  Pain is worse lying down at night.  No SOB, diaphoresis.  Slight nausea w/out vomiting, occasional dizziness.

## 2012-08-26 NOTE — ED Provider Notes (Signed)
Medical screening examination/treatment/procedure(s) were performed by non-physician practitioner and as supervising physician I was immediately available for consultation/collaboration.   Sumer Moorehouse M Bracy Pepper, DO 08/26/12 1343 

## 2012-09-07 ENCOUNTER — Emergency Department (HOSPITAL_COMMUNITY)
Admission: EM | Admit: 2012-09-07 | Discharge: 2012-09-07 | Disposition: A | Discharge status: Home / Self Care | Payer: Medicaid Other | Attending: Emergency Medicine | Admitting: Emergency Medicine

## 2012-09-07 ENCOUNTER — Encounter (HOSPITAL_COMMUNITY): Payer: Self-pay | Admitting: *Deleted

## 2012-09-07 DIAGNOSIS — Z862 Personal history of diseases of the blood and blood-forming organs and certain disorders involving the immune mechanism: Secondary | ICD-10-CM | POA: Insufficient documentation

## 2012-09-07 DIAGNOSIS — M25569 Pain in unspecified knee: Secondary | ICD-10-CM | POA: Insufficient documentation

## 2012-09-07 DIAGNOSIS — Z880 Allergy status to penicillin: Secondary | ICD-10-CM | POA: Insufficient documentation

## 2012-09-07 DIAGNOSIS — M25562 Pain in left knee: Secondary | ICD-10-CM

## 2012-09-07 DIAGNOSIS — I10 Essential (primary) hypertension: Secondary | ICD-10-CM | POA: Insufficient documentation

## 2012-09-07 DIAGNOSIS — Z8639 Personal history of other endocrine, nutritional and metabolic disease: Secondary | ICD-10-CM | POA: Insufficient documentation

## 2012-09-07 DIAGNOSIS — Z8669 Personal history of other diseases of the nervous system and sense organs: Secondary | ICD-10-CM | POA: Insufficient documentation

## 2012-09-07 DIAGNOSIS — F17200 Nicotine dependence, unspecified, uncomplicated: Secondary | ICD-10-CM | POA: Insufficient documentation

## 2012-09-07 NOTE — ED Notes (Signed)
Pt c/o left knee pain that started three days ago,, pain is intermittent, denies any injury. Cms intact distal

## 2012-09-07 NOTE — ED Provider Notes (Signed)
History     CSN: [id number]  Arrival date & time 09/07/12  4098   First MD Initiated Contact with Patient 09/07/12 0901      Chief Complaint  Patient presents with  . Knee Pain    (Consider location/radiation/quality/duration/timing/severity/associated sxs/prior treatment) HPI Comments: 52 year old female presents the emergency department complaining of left knee pain x1 month. Denies any injury or trauma. Pain described as nagging, intermittent rated 7/10, worse when she goes from a squatting position to standing position. She's been taking Vicodin prescribed by Dr. Hilda Lias for her carpal tunnel with relief of her knee pain. Also has been applying a heating pad with relief. Denies any swelling. Denies back pain or injury. No numbness or tingling down her extremity.  Patient is a 52 y.o. female presenting with knee pain. The history is provided by the patient.  Knee Pain Associated symptoms: no back pain     Past Medical History  Diagnosis Date  . Sickle cell trait   . Hypertension     does not take meds  . Blood dyscrasia     sickle cell trait  . Hypercholesterolemia     Past Surgical History  Procedure Laterality Date  . Appendectomy    . Incision and drainage perirectal abscess  12/21/09  . Cesarean section      x 2  . Proctoscopy  10/17/2010    Procedure: PROCTOSCOPY;  Surgeon: Marlane Hatcher;  Location: [facility];  Service: General;  Laterality: N/A;  Rigid Proctoscopy/Possible Fistula in Ano  Procedure ended at 1003  . Therapeutic abortion      x2  . Carpal tunnel release  03/23/2011    Procedure: CARPAL TUNNEL RELEASE;  Surgeon: Darreld Mclean;  Location: [facility];  Service: Orthopedics;  Laterality: Left;  . Carpal tunnel release  05/10/2011    Procedure: CARPAL TUNNEL RELEASE;  Surgeon: Darreld Mclean, MD;  Location: [facility];  Service: Orthopedics;  Laterality: Right;    Family History  Problem Relation Age of Onset  . Kidney failure Daughter   . Sickle cell  anemia Daughter   . Sickle cell anemia Son   . Anesthesia problems Neg Hx   . Hypotension Neg Hx   . Malignant hyperthermia Neg Hx   . Pseudochol deficiency Neg Hx     History  Substance Use Topics  . Smoking status: Current Every Day Smoker -- 0.50 packs/day for 30 years    Types: Cigarettes  . Smokeless tobacco: Not on file  . Alcohol Use: No     Comment: one year and three months    OB History   Grav Para Term Preterm Abortions TAB SAB Ect Mult Living   4 2 2  2            Review of Systems  Musculoskeletal: Negative for back pain and joint swelling.       Positive for left knee pain.  Neurological: Negative for numbness.  All other systems reviewed and are negative.    Allergies  Penicillins; Penicillins cross reactors; and Tape  Home Medications   Current Outpatient Rx  Name  Route  Sig  Dispense  Refill  . acetaminophen (TYLENOL) 500 MG tablet   Oral   Take 1,000 mg by mouth every 6 (six) hours as needed for pain.         . cyclobenzaprine (FLEXERIL) 5 MG tablet   Oral   Take 1 tablet (5 mg total) by mouth 3 (three) times daily as needed for muscle  spasms.   15 tablet   0   . HYDROcodone-acetaminophen (NORCO/VICODIN) 5-325 MG per tablet   Oral   Take 1 tablet by mouth every 4 (four) hours as needed.   10 tablet   0   . ibuprofen (ADVIL,MOTRIN) 600 MG tablet   Oral   Take 600 mg by mouth every 6 (six) hours as needed. For pain         . lisinopril (PRINIVIL,ZESTRIL) 10 MG tablet   Oral   Take 10 mg by mouth daily.         . medroxyPROGESTERone (DEPO-PROVERA) 150 MG/ML injection   Intramuscular   Inject 150 mg into the muscle every 3 (three) months.           . Multiple Vitamin (MULTIVITAMIN WITH MINERALS) TABS   Oral   Take 1 tablet by mouth daily.           BP 125/85  Pulse 87  Temp(Src) 98.5 F (36.9 C)  Resp 18  SpO2 100%  Physical Exam  Constitutional: She is oriented to person, place, and time. She appears  well-developed and well-nourished. No distress.  HENT:  Head: Normocephalic and atraumatic.  Mouth/Throat: Oropharynx is clear and moist.  Eyes: Conjunctivae are normal.  Neck: Normal range of motion. Neck supple.  Cardiovascular: Normal rate, regular rhythm and normal heart sounds.   Pulmonary/Chest: Effort normal and breath sounds normal.  Musculoskeletal: Normal range of motion. She exhibits no edema.  TTP of L knee superior to patella. No deformity, swelling. Full ROM without pain. Crepitus noted on ROM of L knee. Tendinous and ligamentous structures intact. No laxity.   Neurological: She is alert and oriented to person, place, and time. She has normal strength. No sensory deficit. Gait normal.  Skin: Skin is warm and dry. She is not diaphoretic. No erythema.  Psychiatric: She has a normal mood and affect. Her behavior is normal.    ED Course  Procedures (including critical care time)  Labs Reviewed - No data to display No results found.   1. Knee pain, left       MDM  52 y/o female with L knee pain, arthritis. Crepitus on ROM, physical exam otherwise unremarkable. She has an appt with her orthopedist Dr. Hilda Lias this coming Friday. I advised her to discuss knee pain at that visit as she has never brought it up. Advised tylenol, motrin or aleve along with rest, ice/heat and elevation. Patient states understanding of plan and is agreeable.        Trevor Mace, PA-C 09/07/12 [phone]

## 2012-09-07 NOTE — ED Notes (Signed)
Went in to assess pt, pt is on cell phone.

## 2012-09-11 NOTE — ED Provider Notes (Signed)
Medical screening examination/treatment/procedure(s) were performed by non-physician practitioner and as supervising physician I was immediately available for consultation/collaboration.   Marcello Tuzzolino W. Ikeya Brockel, MD 09/11/12 1207 

## 2012-10-22 ENCOUNTER — Emergency Department (HOSPITAL_COMMUNITY)
Admission: EM | Admit: 2012-10-22 | Discharge: 2012-10-22 | Discharge status: Against Medical Advice | Payer: Medicaid Other | Attending: Emergency Medicine | Admitting: Emergency Medicine

## 2012-10-22 ENCOUNTER — Encounter (HOSPITAL_COMMUNITY): Payer: Self-pay | Admitting: Emergency Medicine

## 2012-10-22 DIAGNOSIS — N764 Abscess of vulva: Secondary | ICD-10-CM | POA: Insufficient documentation

## 2012-10-22 DIAGNOSIS — R110 Nausea: Secondary | ICD-10-CM | POA: Insufficient documentation

## 2012-10-22 NOTE — ED Notes (Signed)
States that she woke up this morning feeling nauseated, denies any other symptoms.

## 2012-10-22 NOTE — ED Notes (Signed)
States that she does have an abscess near her vagina that the nausea may be related to.

## 2012-10-22 NOTE — ED Notes (Signed)
Called in pt's room, pt states she does not need to be seen at this time, changed her mind. She is going to go home and rest, reports will return if she deems necessary. NAD noted at this time. Pt ambulated from unit with steady gait.

## 2012-10-22 NOTE — ED Notes (Signed)
Pt presents with epigastric nausea and mild discomfort that began this morning.  Pt also states she has an abscess on her labia, reports has them once in awhile. Denies pain at this time. Denies emesis, fever and diarrhea. NAD noted at this time.

## 2013-01-10 ENCOUNTER — Emergency Department (HOSPITAL_COMMUNITY): Payer: Self-pay

## 2013-01-10 ENCOUNTER — Encounter (HOSPITAL_COMMUNITY): Payer: Self-pay | Admitting: Emergency Medicine

## 2013-01-10 ENCOUNTER — Emergency Department (HOSPITAL_COMMUNITY)
Admission: EM | Admit: 2013-01-10 | Discharge: 2013-01-10 | Disposition: A | Discharge status: Home / Self Care | Payer: Self-pay | Attending: Emergency Medicine | Admitting: Emergency Medicine

## 2013-01-10 DIAGNOSIS — Z3202 Encounter for pregnancy test, result negative: Secondary | ICD-10-CM | POA: Insufficient documentation

## 2013-01-10 DIAGNOSIS — K5900 Constipation, unspecified: Secondary | ICD-10-CM | POA: Insufficient documentation

## 2013-01-10 DIAGNOSIS — I10 Essential (primary) hypertension: Secondary | ICD-10-CM | POA: Insufficient documentation

## 2013-01-10 DIAGNOSIS — Z862 Personal history of diseases of the blood and blood-forming organs and certain disorders involving the immune mechanism: Secondary | ICD-10-CM | POA: Insufficient documentation

## 2013-01-10 DIAGNOSIS — Z79899 Other long term (current) drug therapy: Secondary | ICD-10-CM | POA: Insufficient documentation

## 2013-01-10 DIAGNOSIS — Z8639 Personal history of other endocrine, nutritional and metabolic disease: Secondary | ICD-10-CM | POA: Insufficient documentation

## 2013-01-10 DIAGNOSIS — Z880 Allergy status to penicillin: Secondary | ICD-10-CM | POA: Insufficient documentation

## 2013-01-10 DIAGNOSIS — F17200 Nicotine dependence, unspecified, uncomplicated: Secondary | ICD-10-CM | POA: Insufficient documentation

## 2013-01-10 DIAGNOSIS — R109 Unspecified abdominal pain: Secondary | ICD-10-CM | POA: Insufficient documentation

## 2013-01-10 LAB — URINALYSIS, ROUTINE W REFLEX MICROSCOPIC
Bilirubin Urine: NEGATIVE
Ketones, ur: NEGATIVE mg/dL
Nitrite: NEGATIVE
Protein, ur: NEGATIVE mg/dL
Specific Gravity, Urine: 1.01 (ref 1.005–1.030)
Urobilinogen, UA: 0.2 mg/dL (ref 0.0–1.0)

## 2013-01-10 LAB — COMPREHENSIVE METABOLIC PANEL
ALT: 9 U/L (ref 0–35)
AST: 15 U/L (ref 0–37)
BUN: 8 mg/dL (ref 6–23)
CO2: 23 mEq/L (ref 19–32)
Calcium: 9.6 mg/dL (ref 8.4–10.5)
Chloride: 102 mEq/L (ref 96–112)
Creatinine, Ser: 0.78 mg/dL (ref 0.50–1.10)
GFR calc Af Amer: >90 mL/min (ref >90)
GFR calc non Af Amer: >90 mL/min (ref >90)
Glucose, Bld: 108 mg/dL — ABNORMAL HIGH (ref 70–99)
Total Bilirubin: 0.2 mg/dL — ABNORMAL LOW (ref 0.3–1.2)

## 2013-01-10 LAB — CBC WITH DIFFERENTIAL/PLATELET
Eosinophils Relative: 1 % (ref 0–5)
HCT: 33.1 % — ABNORMAL LOW (ref 36.0–46.0)
Hemoglobin: 11.3 g/dL — ABNORMAL LOW (ref 12.0–15.0)
Lymphocytes Relative: 31 % (ref 12–46)
Lymphs Abs: 2.2 10*3/uL (ref 0.7–4.0)
MCV: 83.8 fL (ref 78.0–100.0)
Monocytes Absolute: 0.3 10*3/uL (ref 0.1–1.0)
Monocytes Relative: 5 % (ref 3–12)
Neutro Abs: 4.5 10*3/uL (ref 1.7–7.7)
RBC: 3.95 MIL/uL (ref 3.87–5.11)
WBC: 7 10*3/uL (ref 4.0–10.5)

## 2013-01-10 LAB — URINE MICROSCOPIC-ADD ON

## 2013-01-10 MED ORDER — ONDANSETRON HCL 4 MG PO TABS: 4.0000 mg | ORAL_TABLET | Freq: Four times a day (QID) | ORAL | Status: DC

## 2013-01-10 MED ORDER — POLYETHYLENE GLYCOL 3350 17 G PO PACK: 17.0000 g | PACK | Freq: Every day | ORAL | Status: DC

## 2013-01-10 MED ORDER — ONDANSETRON 4 MG PO TBDP
4.0000 mg | ORAL_TABLET | Freq: Once | ORAL | Status: AC
  Administered 2013-01-10: 4 mg via ORAL
  Filled 2013-01-10: qty 1

## 2013-01-10 NOTE — ED Provider Notes (Signed)
CSN: [id number]     Arrival date & time 01/10/13  0121 History   First MD Initiated Contact with Patient 01/10/13 0151     Chief Complaint  Patient presents with  . Constipation   (Consider location/radiation/quality/duration/timing/severity/associated sxs/prior Treatment) HPI Comments: Patient patient states she feels constipated and has not been able to have a bowel movement for the past 3 days. His diffuse abdominal pain and low back pain. She's no previous history of constipation. She denies any nausea or vomiting or fever. She's a good by mouth intake. Denies any blood in her stool. She's had previous surgeries for hemorrhoids and boils. She denies any chest pain, abdominal pain, nausea or vomiting.  The history is provided by the patient.    Past Medical History  Diagnosis Date  . Sickle cell trait   . Hypertension     does not take meds  . Blood dyscrasia     sickle cell trait  . Hypercholesterolemia    Past Surgical History  Procedure Laterality Date  . Appendectomy    . Incision and drainage perirectal abscess  12/21/09  . Cesarean section      x 2  . Proctoscopy  10/17/2010    Procedure: PROCTOSCOPY;  Surgeon: Marlane Hatcher;  Location: [facility];  Service: General;  Laterality: N/A;  Rigid Proctoscopy/Possible Fistula in Ano  Procedure ended at 1003  . Therapeutic abortion      x2  . Carpal tunnel release  03/23/2011    Procedure: CARPAL TUNNEL RELEASE;  Surgeon: Darreld Mclean;  Location: [facility];  Service: Orthopedics;  Laterality: Left;  . Carpal tunnel release  05/10/2011    Procedure: CARPAL TUNNEL RELEASE;  Surgeon: Darreld Mclean, MD;  Location: [facility];  Service: Orthopedics;  Laterality: Right;   Family History  Problem Relation Age of Onset  . Kidney failure Daughter   . Sickle cell anemia Daughter   . Sickle cell anemia Son   . Anesthesia problems Neg Hx   . Hypotension Neg Hx   . Malignant hyperthermia Neg Hx   . Pseudochol deficiency Neg Hx     History  Substance Use Topics  . Smoking status: Current Every Day Smoker -- 0.50 packs/day for 30 years    Types: Cigarettes  . Smokeless tobacco: Not on file  . Alcohol Use: No     Comment: one year and three months   OB History   Grav Para Term Preterm Abortions TAB SAB Ect Mult Living   4 2 2  2           Review of Systems  Constitutional: Negative for fever, activity change and appetite change.  HENT: Negative for mouth sores.   Respiratory: Negative for cough, chest tightness and shortness of breath.   Cardiovascular: Negative for chest pain.  Gastrointestinal: Positive for abdominal pain and constipation. Negative for nausea, vomiting, diarrhea and rectal pain.  Genitourinary: Negative for dysuria, hematuria, vaginal bleeding and vaginal discharge.  Musculoskeletal: Negative for back pain.  Skin: Negative for rash.  Neurological: Negative for dizziness, weakness and headaches.  A complete 10 system review of systems was obtained and all systems are negative except as noted in the HPI and PMH.    Allergies  Penicillins; Penicillins cross reactors; and Tape  Home Medications   Current Outpatient Rx  Name  Route  Sig  Dispense  Refill  . acetaminophen (TYLENOL) 500 MG tablet   Oral   Take 1,000 mg by mouth every 6 (six) hours as  needed for pain.         . cyclobenzaprine (FLEXERIL) 5 MG tablet   Oral   Take 1 tablet (5 mg total) by mouth 3 (three) times daily as needed for muscle spasms.   15 tablet   0   . HYDROcodone-acetaminophen (NORCO/VICODIN) 5-325 MG per tablet   Oral   Take 1 tablet by mouth every 4 (four) hours as needed.   10 tablet   0   . ibuprofen (ADVIL,MOTRIN) 600 MG tablet   Oral   Take 600 mg by mouth every 6 (six) hours as needed. For pain         . lisinopril (PRINIVIL,ZESTRIL) 10 MG tablet   Oral   Take 10 mg by mouth daily.         . medroxyPROGESTERone (DEPO-PROVERA) 150 MG/ML injection   Intramuscular   Inject 150 mg into  the muscle every 3 (three) months.           . Multiple Vitamin (MULTIVITAMIN WITH MINERALS) TABS   Oral   Take 1 tablet by mouth daily.         . ondansetron (ZOFRAN) 4 MG tablet   Oral   Take 1 tablet (4 mg total) by mouth every 6 (six) hours.   12 tablet   0   . polyethylene glycol (MIRALAX) packet   Oral   Take 17 g by mouth daily.   14 each   0    BP 147/79  Pulse 86  Temp(Src) 98.7 F (37.1 C) (Oral)  Resp 18  Ht 5\' 9"  (1.753 m)  Wt 172 lb (78.019 kg)  BMI 25.39 kg/m2  SpO2 100% Physical Exam  Constitutional: She appears well-developed and well-nourished. No distress.  HENT:  Head: Normocephalic and atraumatic.  Mouth/Throat: Oropharynx is clear and moist. No oropharyngeal exudate.  Eyes: Conjunctivae and EOM are normal. Pupils are equal, round, and reactive to light.  Neck: Normal range of motion. Neck supple.  Cardiovascular: Normal rate, regular rhythm and normal heart sounds.   No murmur heard. Pulmonary/Chest: Effort normal and breath sounds normal. No respiratory distress.  Abdominal: Soft. There is tenderness. There is no rebound and no guarding.  minimal diffuse abdominal tenderness without guarding or rebound  Genitourinary: Guaiac negative stool.  No fecal impaction, no hemorrhoids.  Chaperone present.  Well healed surgical sites from previous I+Ds  Musculoskeletal: Normal range of motion. She exhibits tenderness. She exhibits no edema.  Paraspinal lumbar tenderness, no midline tenderness  Neurological: She is alert. No cranial nerve deficit. She exhibits normal muscle tone. Coordination normal.  5/5 strength in bilateral lower extremities. Ankle plantar and dorsiflexion intact. Great toe extension intact bilaterally. +2 DP and PT pulses. +2 patellar reflexes bilaterally. Normal gait.   Skin: Skin is warm.    ED Course  Procedures (including critical care time) Labs Review Labs Reviewed  URINALYSIS, ROUTINE W REFLEX MICROSCOPIC - Abnormal;  Notable for the following:    Hgb urine dipstick LARGE (*)    All other components within normal limits  CBC WITH DIFFERENTIAL - Abnormal; Notable for the following:    Hemoglobin 11.3 (*)    HCT 33.1 (*)    All other components within normal limits  COMPREHENSIVE METABOLIC PANEL - Abnormal; Notable for the following:    Potassium 3.1 (*)    Glucose, Bld 108 (*)    Albumin 3.4 (*)    Total Bilirubin 0.2 (*)    All other components within normal limits  PREGNANCY,  URINE  URINE MICROSCOPIC-ADD ON  LIPASE, BLOOD   Imaging Review Dg Abd Acute W/chest  01/10/2013   *RADIOLOGY REPORT*  Clinical Data: Abdominal pain; no bowel movements for 1 week.  ACUTE ABDOMEN SERIES (ABDOMEN 2 VIEW & CHEST 1 VIEW)  Comparison: Chest radiograph performed 08/23/2012, and CT of the abdomen and pelvis from 02/16/2012  Findings: The lungs are well-aerated and clear.  There is no evidence of focal opacification, pleural effusion or pneumothorax. The cardiomediastinal silhouette is within normal limits.  The visualized bowel gas pattern is unremarkable.  Scattered stool and air are seen within the colon; there is no evidence of small bowel dilatation to suggest obstruction.  No free intra-abdominal air is identified on the provided upright view.  No acute osseous abnormalities are seen; the sacroiliac joints are unremarkable in appearance.  IMPRESSION:  1.  Unremarkable bowel gas pattern; no free intra-abdominal air seen.  Small to moderate amount of stool noted in the colon. 2.  No acute cardiopulmonary process identified.   Original Report Authenticated By: Tonia Ghent, M.D.    EKG Interpretation   None       MDM   1. Constipation    Abdominal pain with constipation.  Abdomen soft without peritoneal signs.  X-ray shows stool without obstruction. Urinalysis shows hematuria which patient states a chronic problem of uncertain etiology.  She states she is feeling better and denies any abdominal pain at  this time. Her abdomen is soft with no peritoneal signs. She'll be discharged on MiraLAX and a bowel regimen and followup with PCP. Return to precautions discussed.  Glynn Octave, MD 01/10/13 [phone]

## 2013-01-10 NOTE — ED Notes (Signed)
Patient reports feels as if she must be constipated. States diffuse abdominal and back pain. Also reports when she attempts to have bowel movement small amount of stool and mucus pass.

## 2013-01-12 LAB — OCCULT BLOOD, POC DEVICE: Fecal Occult Bld: NEGATIVE

## 2013-03-09 ENCOUNTER — Other Ambulatory Visit (HOSPITAL_COMMUNITY): Payer: Self-pay | Admitting: Physician Assistant

## 2013-03-09 DIAGNOSIS — Z139 Encounter for screening, unspecified: Secondary | ICD-10-CM

## 2013-03-16 ENCOUNTER — Ambulatory Visit (HOSPITAL_COMMUNITY): Payer: Self-pay

## 2013-03-17 ENCOUNTER — Ambulatory Visit (HOSPITAL_COMMUNITY): Payer: Self-pay

## 2013-03-20 ENCOUNTER — Ambulatory Visit (HOSPITAL_COMMUNITY): Payer: Self-pay

## 2013-03-25 ENCOUNTER — Ambulatory Visit (HOSPITAL_COMMUNITY): Payer: Self-pay

## 2013-03-27 ENCOUNTER — Encounter (HOSPITAL_COMMUNITY): Payer: Self-pay | Admitting: Emergency Medicine

## 2013-03-27 ENCOUNTER — Emergency Department (HOSPITAL_COMMUNITY)
Admission: EM | Admit: 2013-03-27 | Discharge: 2013-03-27 | Disposition: A | Discharge status: Home / Self Care | Payer: Self-pay | Attending: Emergency Medicine | Admitting: Emergency Medicine

## 2013-03-27 DIAGNOSIS — Z862 Personal history of diseases of the blood and blood-forming organs and certain disorders involving the immune mechanism: Secondary | ICD-10-CM | POA: Insufficient documentation

## 2013-03-27 DIAGNOSIS — Z79899 Other long term (current) drug therapy: Secondary | ICD-10-CM | POA: Insufficient documentation

## 2013-03-27 DIAGNOSIS — K2970 Gastritis, unspecified, without bleeding: Secondary | ICD-10-CM

## 2013-03-27 DIAGNOSIS — Z880 Allergy status to penicillin: Secondary | ICD-10-CM | POA: Insufficient documentation

## 2013-03-27 DIAGNOSIS — I10 Essential (primary) hypertension: Secondary | ICD-10-CM | POA: Insufficient documentation

## 2013-03-27 DIAGNOSIS — Z8639 Personal history of other endocrine, nutritional and metabolic disease: Secondary | ICD-10-CM | POA: Insufficient documentation

## 2013-03-27 DIAGNOSIS — F17200 Nicotine dependence, unspecified, uncomplicated: Secondary | ICD-10-CM | POA: Insufficient documentation

## 2013-03-27 LAB — COMPREHENSIVE METABOLIC PANEL
ALT: 12 U/L (ref 0–35)
AST: 34 U/L (ref 0–37)
Albumin: 3.7 g/dL (ref 3.5–5.2)
BUN: 9 mg/dL (ref 6–23)
CO2: 26 mEq/L (ref 19–32)
Calcium: 9.8 mg/dL (ref 8.4–10.5)
Creatinine, Ser: 0.75 mg/dL (ref 0.50–1.10)
GFR calc Af Amer: >90 mL/min (ref >90)
GFR calc non Af Amer: >90 mL/min (ref >90)
Glucose, Bld: 104 mg/dL — ABNORMAL HIGH (ref 70–99)
Sodium: 141 mEq/L (ref 135–145)
Total Bilirubin: 0.3 mg/dL (ref 0.3–1.2)
Total Protein: 7.8 g/dL (ref 6.0–8.3)

## 2013-03-27 LAB — CBC WITH DIFFERENTIAL/PLATELET
Basophils Absolute: 0 10*3/uL (ref 0.0–0.1)
Basophils Relative: 0 % (ref 0–1)
Eosinophils Absolute: 0 10*3/uL (ref 0.0–0.7)
Eosinophils Relative: 1 % (ref 0–5)
HCT: 34.9 % — ABNORMAL LOW (ref 36.0–46.0)
Lymphocytes Relative: 25 % (ref 12–46)
MCH: 28.8 pg (ref 26.0–34.0)
MCHC: 33.8 g/dL (ref 30.0–36.0)
MCV: 85.1 fL (ref 78.0–100.0)
Monocytes Absolute: 0.3 10*3/uL (ref 0.1–1.0)
Monocytes Relative: 5 % (ref 3–12)
Neutro Abs: 4.1 10*3/uL (ref 1.7–7.7)
Platelets: 352 10*3/uL (ref 150–400)
RDW: 13.1 % (ref 11.5–15.5)
WBC: 5.9 10*3/uL (ref 4.0–10.5)

## 2013-03-27 LAB — LIPASE, BLOOD: Lipase: 27 U/L (ref 11–59)

## 2013-03-27 MED ORDER — ONDANSETRON HCL 4 MG/2ML IJ SOLN
4.0000 mg | Freq: Once | INTRAMUSCULAR | Status: AC
  Administered 2013-03-27: 4 mg via INTRAVENOUS
  Filled 2013-03-27: qty 2

## 2013-03-27 MED ORDER — RANITIDINE HCL 150 MG PO TABS: 150.0000 mg | ORAL_TABLET | Freq: Two times a day (BID) | ORAL | Status: DC

## 2013-03-27 MED ORDER — SUCRALFATE 1 GM/10ML PO SUSP: 1.0000 g | Freq: Three times a day (TID) | ORAL | Status: DC

## 2013-03-27 MED ORDER — GI COCKTAIL ~~LOC~~
30.0000 mL | Freq: Once | ORAL | Status: AC
  Administered 2013-03-27: 30 mL via ORAL
  Filled 2013-03-27: qty 30

## 2013-03-27 NOTE — Discharge Instructions (Signed)
Gastritis, Adult  Gastritis is soreness and puffiness (inflammation) of the lining of the stomach. If you do not get help, gastritis can cause bleeding and sores (ulcers) in the stomach.  HOME CARE   · Only take medicine as told by your doctor.  · If you were given antibiotic medicines, take them as told. Finish the medicines even if you start to feel better.  · Drink enough fluids to keep your pee (urine) clear or pale yellow.  · Avoid foods and drinks that make your problems worse. Foods you may want to avoid include:  · Caffeine or alcohol.  · Chocolate.  · Mint.  · Garlic and onions.  · Spicy foods.  · Citrus fruits, including oranges, lemons, or limes.  · Food containing tomatoes, including sauce, chili, salsa, and pizza.  · Fried and fatty foods.  · Eat small meals throughout the day instead of large meals.  GET HELP RIGHT AWAY IF:   · You have black or dark red poop (stools).  · You throw up (vomit) blood. It may look like coffee grounds.  · You cannot keep fluids down.  · Your belly (abdominal) pain gets worse.  · You have a fever.  · You do not feel better after 1 week.  · You have any other questions or concerns.  MAKE SURE YOU:   · Understand these instructions.  · Will watch your condition.  · Will get help right away if you are not doing well or get worse.  Document Released: 09/05/2007 Document Revised: 06/11/2011 Document Reviewed: 05/02/2011  [employer]® Patient Information ©2014 [employer].

## 2013-03-27 NOTE — ED Notes (Signed)
Patient reports: -nausea and pain has subsided

## 2013-03-27 NOTE — ED Notes (Signed)
Epigastric pain with nausea x 2 days.  Worse with eating.

## 2013-03-27 NOTE — ED Provider Notes (Signed)
CSN: [id number]     Arrival date & time 03/27/13  1031 History  This chart was scribed for Gilda Crease, MD, by Yevette Edwards, ED Scribe. This patient was seen in room [facility] and the patient's care was started at 11:20 AM.   None    Chief Complaint  Patient presents with  . epgastric pain    The history is provided by the patient. No language interpreter was used.   HPI Comments: Amber Drake is a 52 y.o. female, with a h/o HTN, who presents to the Emergency Department complaining of epigastric pain with associated nausea which has been occurring intermittently for three days. She reports the pain is increased with eating.  She has treated the symptoms with Pepto-Bismal , but she has not used any TUMs. She denies emesis, diarrhea, hematochezia, black stool, fever, chest pain, or SOB. She also denies a h/o similar symptoms. The pt has a h/o abdominal surgeries including appendectomy and Cesarean sections. She is a current smoker.  Past Medical History  Diagnosis Date  . Sickle cell trait   . Hypertension     does not take meds  . Blood dyscrasia     sickle cell trait  . Hypercholesterolemia    Past Surgical History  Procedure Laterality Date  . Appendectomy    . Incision and drainage perirectal abscess  12/21/09  . Cesarean section      x 2  . Proctoscopy  10/17/2010    Procedure: PROCTOSCOPY;  Surgeon: Marlane Hatcher;  Location: [facility];  Service: General;  Laterality: N/A;  Rigid Proctoscopy/Possible Fistula in Ano  Procedure ended at 1003  . Therapeutic abortion      x2  . Carpal tunnel release  03/23/2011    Procedure: CARPAL TUNNEL RELEASE;  Surgeon: Darreld Mclean;  Location: [facility];  Service: Orthopedics;  Laterality: Left;  . Carpal tunnel release  05/10/2011    Procedure: CARPAL TUNNEL RELEASE;  Surgeon: Darreld Mclean, MD;  Location: [facility];  Service: Orthopedics;  Laterality: Right;   Family History  Problem Relation Age of Onset  . Kidney failure  Daughter   . Sickle cell anemia Daughter   . Sickle cell anemia Son   . Anesthesia problems Neg Hx   . Hypotension Neg Hx   . Malignant hyperthermia Neg Hx   . Pseudochol deficiency Neg Hx    History  Substance Use Topics  . Smoking status: Current Every Day Smoker -- 0.50 packs/day for 30 years    Types: Cigarettes  . Smokeless tobacco: Not on file  . Alcohol Use: No     Comment: one year and three months   OB History   Grav Para Term Preterm Abortions TAB SAB Ect Mult Living   4 2 2  2           Review of Systems  Constitutional: Negative for fever.  Respiratory: Negative for shortness of breath.   Cardiovascular: Negative for chest pain.  Gastrointestinal: Positive for nausea and abdominal pain. Negative for vomiting and blood in stool.  All other systems reviewed and are negative.    Allergies  Penicillins; Penicillins cross reactors; and Tape  Home Medications   Current Outpatient Rx  Name  Route  Sig  Dispense  Refill  . acetaminophen (TYLENOL) 500 MG tablet   Oral   Take 1,000 mg by mouth every 6 (six) hours as needed for pain.         . cyclobenzaprine (FLEXERIL) 5 MG tablet  Oral   Take 1 tablet (5 mg total) by mouth 3 (three) times daily as needed for muscle spasms.   15 tablet   0   . HYDROcodone-acetaminophen (NORCO/VICODIN) 5-325 MG per tablet   Oral   Take 1 tablet by mouth every 4 (four) hours as needed.   10 tablet   0   . ibuprofen (ADVIL,MOTRIN) 600 MG tablet   Oral   Take 600 mg by mouth every 6 (six) hours as needed. For pain         . lisinopril (PRINIVIL,ZESTRIL) 10 MG tablet   Oral   Take 10 mg by mouth daily.         . medroxyPROGESTERone (DEPO-PROVERA) 150 MG/ML injection   Intramuscular   Inject 150 mg into the muscle every 3 (three) months.           . Multiple Vitamin (MULTIVITAMIN WITH MINERALS) TABS   Oral   Take 1 tablet by mouth daily.         . ondansetron (ZOFRAN) 4 MG tablet   Oral   Take 1 tablet (4  mg total) by mouth every 6 (six) hours.   12 tablet   0   . polyethylene glycol (MIRALAX) packet   Oral   Take 17 g by mouth daily.   14 each   0    Triage Vitals: BP 148/83  Pulse 75  Temp(Src) 98.4 F (36.9 C) (Oral)  Resp 16  Ht 5\' 9"  (1.753 m)  Wt 171 lb (77.565 kg)  BMI 25.24 kg/m2  SpO2 100%  Physical Exam  Constitutional: She is oriented to person, place, and time. She appears well-developed and well-nourished. No distress.  HENT:  Head: Normocephalic and atraumatic.  Right Ear: Hearing normal.  Left Ear: Hearing normal.  Nose: Nose normal.  Mouth/Throat: Oropharynx is clear and moist and mucous membranes are normal.  Eyes: Conjunctivae and EOM are normal. Pupils are equal, round, and reactive to light.  Neck: Normal range of motion. Neck supple.  Cardiovascular: Regular rhythm, S1 normal and S2 normal.  Exam reveals no gallop and no friction rub.   No murmur heard. Pulmonary/Chest: Effort normal and breath sounds normal. No respiratory distress. She exhibits no tenderness.  Abdominal: Soft. Normal appearance and bowel sounds are normal. There is no hepatosplenomegaly. There is tenderness. There is no rebound, no guarding, no tenderness at McBurney's point and negative Murphy's sign. No hernia.  Epigastric tenderness.  Musculoskeletal: Normal range of motion.  Neurological: She is alert and oriented to person, place, and time. She has normal strength. No cranial nerve deficit or sensory deficit. Coordination normal. GCS eye subscore is 4. GCS verbal subscore is 5. GCS motor subscore is 6.  Skin: Skin is warm, dry and intact. No rash noted. No cyanosis.  Psychiatric: She has a normal mood and affect. Her speech is normal and behavior is normal. Thought content normal.    ED Course  Procedures (including critical care time)  DIAGNOSTIC STUDIES: Oxygen Saturation is 100% on room air, normal by my interpretation.    COORDINATION OF CARE:  11:24 AM- Discussed  treatment plan with patient, and the patient agreed to the plan.   Labs Review Labs Reviewed  CBC WITH DIFFERENTIAL - Abnormal; Notable for the following:    Hemoglobin 11.8 (*)    HCT 34.9 (*)    All other components within normal limits  COMPREHENSIVE METABOLIC PANEL - Abnormal; Notable for the following:    Glucose, Bld 104 (*)  All other components within normal limits  LIPASE, BLOOD  TROPONIN I   Imaging Review No results found.  EKG Interpretation    Date/Time:  Friday March 27 2013 12:09:57 EST Ventricular Rate:  65 PR Interval:  170 QRS Duration: 82 QT Interval:  384 QTC Calculation: 399 R Axis:   39 Text Interpretation:  Normal sinus rhythm Anterior infarct , age undetermined Abnormal ECG When compared with ECG of 23-Aug-2012 08:09, Nonspecific T wave abnormality now evident in Anterior leads Confirmed by POLLINA  MD, CHRISTOPHER ([id number]) on 03/27/2013 12:56:03 PM            MDM  Diagnosis: 1 abdominal pain 2. Gastritis  Patient presents to the ER with complaints of pain in her upper abdominal area. Pain has been present for 2 days it worsens when she eats. She did have tenderness in the epigastric area. This is very atypical for cardiac etiology. EKG does not show any obvious ischemia or infarction troponin was negative. Patient had complete resolution of the symptoms after GI cocktail. This is most consistent with GI etiology of the pain. She did not have tenderness in the right upper quadrant or any evidence of tenderness over the gallbladder. Labs are normal. I suspect gastritis or possibly even peptic ulcer disease. This could be treated empirically, followup with primary doctor. Return if symptoms worsen, especially if she has vomiting blood or  I personally performed the services described in this documentation, which was scribed in my presence. The recorded information has been reviewed and is accurate.     Gilda Crease, MD 03/27/13 1257

## 2013-03-30 ENCOUNTER — Ambulatory Visit (HOSPITAL_COMMUNITY)
Admission: RE | Admit: 2013-03-30 | Discharge: 2013-03-30 | Disposition: A | Discharge status: Home / Self Care | Payer: Self-pay | Source: Ambulatory Visit | Attending: Physician Assistant | Admitting: Physician Assistant

## 2013-03-30 DIAGNOSIS — Z139 Encounter for screening, unspecified: Secondary | ICD-10-CM | POA: Insufficient documentation

## 2013-04-24 ENCOUNTER — Encounter (HOSPITAL_COMMUNITY): Payer: Self-pay | Admitting: Emergency Medicine

## 2013-04-24 ENCOUNTER — Emergency Department (HOSPITAL_COMMUNITY)
Admission: EM | Admit: 2013-04-24 | Discharge: 2013-04-24 | Disposition: A | Discharge status: Home / Self Care | Payer: Self-pay | Attending: Emergency Medicine | Admitting: Emergency Medicine

## 2013-04-24 DIAGNOSIS — Z8639 Personal history of other endocrine, nutritional and metabolic disease: Secondary | ICD-10-CM | POA: Insufficient documentation

## 2013-04-24 DIAGNOSIS — Z880 Allergy status to penicillin: Secondary | ICD-10-CM | POA: Insufficient documentation

## 2013-04-24 DIAGNOSIS — I10 Essential (primary) hypertension: Secondary | ICD-10-CM | POA: Insufficient documentation

## 2013-04-24 DIAGNOSIS — L0291 Cutaneous abscess, unspecified: Secondary | ICD-10-CM

## 2013-04-24 DIAGNOSIS — L03319 Cellulitis of trunk, unspecified: Principal | ICD-10-CM

## 2013-04-24 DIAGNOSIS — L02219 Cutaneous abscess of trunk, unspecified: Secondary | ICD-10-CM | POA: Insufficient documentation

## 2013-04-24 DIAGNOSIS — Z79899 Other long term (current) drug therapy: Secondary | ICD-10-CM | POA: Insufficient documentation

## 2013-04-24 DIAGNOSIS — Z862 Personal history of diseases of the blood and blood-forming organs and certain disorders involving the immune mechanism: Secondary | ICD-10-CM | POA: Insufficient documentation

## 2013-04-24 DIAGNOSIS — Z9089 Acquired absence of other organs: Secondary | ICD-10-CM | POA: Insufficient documentation

## 2013-04-24 DIAGNOSIS — F17200 Nicotine dependence, unspecified, uncomplicated: Secondary | ICD-10-CM | POA: Insufficient documentation

## 2013-04-24 MED ORDER — SULFAMETHOXAZOLE-TRIMETHOPRIM 800-160 MG PO TABS: 1.0000 | ORAL_TABLET | Freq: Two times a day (BID) | ORAL | Status: DC

## 2013-04-24 MED ORDER — HYDROCODONE-ACETAMINOPHEN 5-325 MG PO TABS: ORAL_TABLET | ORAL | Status: DC

## 2013-04-24 MED ORDER — SULFAMETHOXAZOLE-TMP DS 800-160 MG PO TABS
1.0000 | ORAL_TABLET | Freq: Once | ORAL | Status: AC
  Administered 2013-04-24: 1 via ORAL
  Filled 2013-04-24: qty 1

## 2013-04-24 NOTE — Discharge Instructions (Signed)

## 2013-04-24 NOTE — ED Notes (Signed)
Abscess to left buttock x 2 days

## 2013-04-26 NOTE — ED Provider Notes (Signed)
CSN: [id number]     Arrival date & time 04/24/13  0844 History   First MD Initiated Contact with Patient 04/24/13 [phone]     Chief Complaint  Patient presents with  . Abscess   (Consider location/radiation/quality/duration/timing/severity/associated sxs/prior Treatment) Patient is a 53 y.o. female presenting with abscess. The history is provided by the patient.  Abscess Location:  Ano-genital Ano-genital abscess location:  Perineum Size:  2 cm Abscess quality: induration and painful   Abscess quality: not draining, no fluctuance, no itching, no redness and not weeping   Red streaking: no   Duration:  2 days Progression:  Unchanged Pain details:    Quality:  Pressure and throbbing   Severity:  Mild   Timing:  Constant   Progression:  Unchanged Chronicity:  Recurrent Context: not diabetes and not immunosuppression   Relieved by:  Nothing Worsened by:  Nothing tried Ineffective treatments:  Warm compresses Associated symptoms: no fever, no headaches, no nausea and no vomiting   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 Sickle cell trait   . Hypertension     does not take meds  . Blood dyscrasia     sickle cell trait  . Hypercholesterolemia    Past Surgical History  Procedure Laterality Date  . Appendectomy    . Incision and drainage perirectal abscess  12/21/09  . Cesarean section      x 2  . Proctoscopy  10/17/2010    Procedure: PROCTOSCOPY;  Surgeon: Scherry Ran;  Location: [facility];  Service: General;  Laterality: N/A;  Rigid Proctoscopy/Possible Fistula in Ano  Procedure ended at 1003  . Therapeutic abortion      x2  . Carpal tunnel release  03/23/2011    Procedure: CARPAL TUNNEL RELEASE;  Surgeon: Sanjuana Kava;  Location: [facility];  Service: Orthopedics;  Laterality: Left;  . Carpal tunnel release  05/10/2011    Procedure: CARPAL TUNNEL RELEASE;  Surgeon: Sanjuana Kava, MD;  Location: [facility];  Service: Orthopedics;   Laterality: Right;   Family History  Problem Relation Age of Onset  . Kidney failure Daughter   . Sickle cell anemia Daughter   . Sickle cell anemia Son   . Anesthesia problems Neg Hx   . Hypotension Neg Hx   . Malignant hyperthermia Neg Hx   . Pseudochol deficiency Neg Hx    History  Substance Use Topics  . Smoking status: Current Every Day Smoker -- 0.50 packs/day for 30 years    Types: Cigarettes  . Smokeless tobacco: Not on file  . Alcohol Use: No     Comment: one year and three months   OB History   Grav Para Term Preterm Abortions TAB SAB Ect Mult Living   4 2 2  2           Review of Systems  Constitutional: Negative for fever and chills.  Gastrointestinal: Negative for nausea and vomiting.  Genitourinary: Negative for dysuria, hematuria, vaginal bleeding, vaginal discharge and pelvic pain.  Musculoskeletal: Negative for arthralgias and joint swelling.  Skin: Positive for color change.       Abscess   Neurological: Negative for headaches.  Hematological: Negative for adenopathy.  All other systems reviewed and are negative.    Allergies  Penicillins; Penicillins cross reactors; and Tape  Home Medications   Current Outpatient Rx  Name  Route  Sig  Dispense  Refill  . cyclobenzaprine (FLEXERIL) 5 MG  tablet   Oral   Take 1 tablet (5 mg total) by mouth 3 (three) times daily as needed for muscle spasms.   15 tablet   0   . HYDROcodone-acetaminophen (NORCO/VICODIN) 5-325 MG per tablet   Oral   Take 1 tablet by mouth every 4 (four) hours as needed.   10 tablet   0   . lisinopril (PRINIVIL,ZESTRIL) 10 MG tablet   Oral   Take 10 mg by mouth daily.         . Multiple Vitamin (MULTIVITAMIN WITH MINERALS) TABS   Oral   Take 1 tablet by mouth daily.         . polyethylene glycol (MIRALAX / GLYCOLAX) packet   Oral   Take 17 g by mouth daily as needed for mild constipation.         . ranitidine (ZANTAC) 150 MG tablet   Oral   Take 1 tablet (150 mg  total) by mouth 2 (two) times daily.   60 tablet   0   . HYDROcodone-acetaminophen (NORCO/VICODIN) 5-325 MG per tablet      Take one-two tabs po q 4-6 hrs prn pain   12 tablet   0   . sulfamethoxazole-trimethoprim (SEPTRA DS) 800-160 MG per tablet   Oral   Take 1 tablet by mouth 2 (two) times daily.   28 tablet   0    BP 124/73  Pulse 97  Temp(Src) 98.4 F (36.9 C)  Resp 16  Ht 5\' 9"  (1.753 m)  Wt 172 lb (78.019 kg)  BMI 25.39 kg/m2  SpO2 99%  LMP 08/12/2010 Physical Exam  Nursing note and vitals reviewed. Constitutional: She is oriented to person, place, and time. She appears well-developed and well-nourished. No distress.  HENT:  Head: Normocephalic and atraumatic.  Cardiovascular: Normal rate, regular rhythm and normal heart sounds.   No murmur heard. Pulmonary/Chest: Effort normal and breath sounds normal. No respiratory distress.  Abdominal: Soft. She exhibits no distension. There is no tenderness. There is no rebound and no guarding.  Genitourinary:     Musculoskeletal: Normal range of motion.  Neurological: She is alert and oriented to person, place, and time. She exhibits normal muscle tone. Coordination normal.  Skin: Skin is warm and dry.    ED Course  Procedures (including critical care time) Labs Review Labs Reviewed - No data to display Imaging Review No results found.  EKG Interpretation   None       MDM   1. Abscess    Patient with 2 cm area of induration to the left perineum.  Pt has hx of same.  Pt declines I&D at this time.  Prefers to try antibiotics and continue warm soaks.  States that her previous abscesses resolve with antibiotics.  She agrees to return here in 2-3 days if the sx's are not improving.  She is well appearing, VSS.  Stable for d/c    Indianna Boran L. Vanessa Genesee, PA-C 04/26/13 1758

## 2013-04-27 NOTE — ED Provider Notes (Signed)
Medical screening examination/treatment/procedure(s) were performed by non-physician practitioner and as supervising physician I was immediately available for consultation/collaboration.  EKG Interpretation   None         Alfonzo Feller, DO 04/27/13 [phone]

## 2013-06-19 ENCOUNTER — Emergency Department (HOSPITAL_COMMUNITY)
Admission: EM | Admit: 2013-06-19 | Discharge: 2013-06-19 | Disposition: A | Discharge status: Home / Self Care | Payer: Self-pay | Attending: Emergency Medicine | Admitting: Emergency Medicine

## 2013-06-19 ENCOUNTER — Encounter (HOSPITAL_COMMUNITY): Payer: Self-pay | Admitting: Emergency Medicine

## 2013-06-19 DIAGNOSIS — Z79899 Other long term (current) drug therapy: Secondary | ICD-10-CM | POA: Insufficient documentation

## 2013-06-19 DIAGNOSIS — I10 Essential (primary) hypertension: Secondary | ICD-10-CM | POA: Insufficient documentation

## 2013-06-19 DIAGNOSIS — J069 Acute upper respiratory infection, unspecified: Secondary | ICD-10-CM | POA: Insufficient documentation

## 2013-06-19 DIAGNOSIS — Z880 Allergy status to penicillin: Secondary | ICD-10-CM | POA: Insufficient documentation

## 2013-06-19 DIAGNOSIS — F17200 Nicotine dependence, unspecified, uncomplicated: Secondary | ICD-10-CM | POA: Insufficient documentation

## 2013-06-19 DIAGNOSIS — Z8639 Personal history of other endocrine, nutritional and metabolic disease: Secondary | ICD-10-CM | POA: Insufficient documentation

## 2013-06-19 DIAGNOSIS — Z862 Personal history of diseases of the blood and blood-forming organs and certain disorders involving the immune mechanism: Secondary | ICD-10-CM | POA: Insufficient documentation

## 2013-06-19 LAB — RAPID STREP SCREEN (MED CTR MEBANE ONLY): STREPTOCOCCUS, GROUP A SCREEN (DIRECT): NEGATIVE

## 2013-06-19 MED ORDER — DEXAMETHASONE 4 MG PO TABS
12.0000 mg | ORAL_TABLET | Freq: Once | ORAL | Status: AC
  Administered 2013-06-19: 12 mg via ORAL
  Filled 2013-06-19: qty 3

## 2013-06-19 NOTE — Discharge Instructions (Signed)
You may use Afrin nasal spray to try and keep your nose clear. However, he cannot use it for more than 3 days her lack she make your nose symptoms worse. The steroid that you got in the ED will help with inflammation for the next 2-3 days.  Upper Respiratory Infection, Adult An upper respiratory infection (URI) is also sometimes known as the common cold. The upper respiratory tract includes the nose, sinuses, throat, trachea, and bronchi. Bronchi are the airways leading to the lungs. Most people improve within 1 week, but symptoms can last up to 2 weeks. A residual cough may last even longer.  CAUSES Many different viruses can infect the tissues lining the upper respiratory tract. The tissues become irritated and inflamed and often become very moist. Mucus production is also common. A cold is contagious. You can easily spread the virus to others by oral contact. This includes kissing, sharing a glass, coughing, or sneezing. Touching your mouth or nose and then touching a surface, which is then touched by another person, can also spread the virus. SYMPTOMS  Symptoms typically develop 1 to 3 days after you come in contact with a cold virus. Symptoms vary from person to person. They may include:  Runny nose.  Sneezing.  Nasal congestion.  Sinus irritation.  Sore throat.  Loss of voice (laryngitis).  Cough.  Fatigue.  Muscle aches.  Loss of appetite.  Headache.  Low-grade fever. DIAGNOSIS  You might diagnose your own cold based on familiar symptoms, since most people get a cold 2 to 3 times a year. Your caregiver can confirm this based on your exam. Most importantly, your caregiver can check that your symptoms are not due to another disease such as strep throat, sinusitis, pneumonia, asthma, or epiglottitis. Blood tests, throat tests, and X-rays are not necessary to diagnose a common cold, but they may sometimes be helpful in excluding other more serious diseases. Your caregiver will  decide if any further tests are required. RISKS AND COMPLICATIONS  You may be at risk for a more severe case of the common cold if you smoke cigarettes, have chronic heart disease (such as heart failure) or lung disease (such as asthma), or if you have a weakened immune system. The very young and very old are also at risk for more serious infections. Bacterial sinusitis, middle ear infections, and bacterial pneumonia can complicate the common cold. The common cold can worsen asthma and chronic obstructive pulmonary disease (COPD). Sometimes, these complications can require emergency medical care and may be life-threatening. PREVENTION  The best way to protect against getting a cold is to practice good hygiene. Avoid oral or hand contact with people with cold symptoms. Wash your hands often if contact occurs. There is no clear evidence that vitamin C, vitamin E, echinacea, or exercise reduces the chance of developing a cold. However, it is always recommended to get plenty of rest and practice good nutrition. TREATMENT  Treatment is directed at relieving symptoms. There is no cure. Antibiotics are not effective, because the infection is caused by a virus, not by bacteria. Treatment may include:  Increased fluid intake. Sports drinks offer valuable electrolytes, sugars, and fluids.  Breathing heated mist or steam (vaporizer or shower).  Eating chicken soup or other clear broths, and maintaining good nutrition.  Getting plenty of rest.  Using gargles or lozenges for comfort.  Controlling fevers with ibuprofen or acetaminophen as directed by your caregiver.  Increasing usage of your inhaler if you have asthma. Zinc gel  and zinc lozenges, taken in the first 24 hours of the common cold, can shorten the duration and lessen the severity of symptoms. Pain medicines may help with fever, muscle aches, and throat pain. A variety of non-prescription medicines are available to treat congestion and runny nose.  Your caregiver can make recommendations and may suggest nasal or lung inhalers for other symptoms.  HOME CARE INSTRUCTIONS   Only take over-the-counter or prescription medicines for pain, discomfort, or fever as directed by your caregiver.  Use a warm mist humidifier or inhale steam from a shower to increase air moisture. This may keep secretions moist and make it easier to breathe.  Drink enough water and fluids to keep your urine clear or pale yellow.  Rest as needed.  Return to work when your temperature has returned to normal or as your caregiver advises. You may need to stay home longer to avoid infecting others. You can also use a face mask and careful hand washing to prevent spread of the virus. SEEK MEDICAL CARE IF:   After the first few days, you feel you are getting worse rather than better.  You need your caregiver's advice about medicines to control symptoms.  You develop chills, worsening shortness of breath, or brown or red sputum. These may be signs of pneumonia.  You develop yellow or brown nasal discharge or pain in the face, especially when you bend forward. These may be signs of sinusitis.  You develop a fever, swollen neck glands, pain with swallowing, or white areas in the back of your throat. These may be signs of strep throat. SEEK IMMEDIATE MEDICAL CARE IF:   You have a fever.  You develop severe or persistent headache, ear pain, sinus pain, or chest pain.  You develop wheezing, a prolonged cough, cough up blood, or have a change in your usual mucus (if you have chronic lung disease).  You develop sore muscles or a stiff neck. Document Released: 09/12/2000 Document Revised: 06/11/2011 Document Reviewed: 07/21/2010 [facility] Patient Information 2014 [place], [place].

## 2013-06-19 NOTE — ED Provider Notes (Signed)
CSN: [id number]     Arrival date & time 06/19/13  0609 History   First MD Initiated Contact with Patient 06/19/13 [phone]     Chief Complaint  Patient presents with  . Sore Throat     (Consider location/radiation/quality/duration/timing/severity/associated sxs/prior Treatment) Patient is a 53 y.o. female presenting with pharyngitis. The history is provided by the patient.  Sore Throat  She had onset yesterday of nasal congestion with clear rhinorrhea, sore throat, and cough productive of some clear sputum. She denies fever, chills, sweats. There's been no dyspnea. She denies nausea or vomiting or diarrhea. There has been some mild body aches. She has had a sick contact. She is treated herself with over-the-counter cough drops and has also taken some leftover erythromycin but it has not helping her.  Past Medical History  Diagnosis Date  . Sickle cell trait   . Hypertension     does not take meds  . Blood dyscrasia     sickle cell trait  . Hypercholesterolemia    Past Surgical History  Procedure Laterality Date  . Appendectomy    . Incision and drainage perirectal abscess  12/21/09  . Cesarean section      x 2  . Proctoscopy  10/17/2010    Procedure: PROCTOSCOPY;  Surgeon: Scherry Ran;  Location: [facility];  Service: General;  Laterality: N/A;  Rigid Proctoscopy/Possible Fistula in Ano  Procedure ended at 1003  . Therapeutic abortion      x2  . Carpal tunnel release  03/23/2011    Procedure: CARPAL TUNNEL RELEASE;  Surgeon: Sanjuana Kava;  Location: [facility];  Service: Orthopedics;  Laterality: Left;  . Carpal tunnel release  05/10/2011    Procedure: CARPAL TUNNEL RELEASE;  Surgeon: Sanjuana Kava, MD;  Location: [facility];  Service: Orthopedics;  Laterality: Right;   Family History  Problem Relation Age of Onset  . Kidney failure Daughter   . Sickle cell anemia Daughter   . Sickle cell anemia Son   . Anesthesia problems Neg Hx   . Hypotension Neg Hx   . Malignant hyperthermia  Neg Hx   . Pseudochol deficiency Neg Hx    History  Substance Use Topics  . Smoking status: Current Every Day Smoker -- 0.50 packs/day for 30 years    Types: Cigarettes  . Smokeless tobacco: Not on file  . Alcohol Use: No     Comment: one year and three months   OB History   Grav Para Term Preterm Abortions TAB SAB Ect Mult Living   4 2 2  2           Review of Systems  All other systems reviewed and are negative.      Allergies  Penicillins; Penicillins cross reactors; and Tape  Home Medications   Current Outpatient Rx  Name  Route  Sig  Dispense  Refill  . cyclobenzaprine (FLEXERIL) 5 MG tablet   Oral   Take 1 tablet (5 mg total) by mouth 3 (three) times daily as needed for muscle spasms.   15 tablet   0   . lisinopril (PRINIVIL,ZESTRIL) 10 MG tablet   Oral   Take 10 mg by mouth daily.         . Multiple Vitamin (MULTIVITAMIN WITH MINERALS) TABS   Oral   Take 1 tablet by mouth daily.         Marland Kitchen HYDROcodone-acetaminophen (NORCO/VICODIN) 5-325 MG per tablet   Oral   Take 1 tablet by mouth every 4 (four)  hours as needed.   10 tablet   0   . HYDROcodone-acetaminophen (NORCO/VICODIN) 5-325 MG per tablet      Take one-two tabs po q 4-6 hrs prn pain   12 tablet   0   . polyethylene glycol (MIRALAX / GLYCOLAX) packet   Oral   Take 17 g by mouth daily as needed for mild constipation.         . ranitidine (ZANTAC) 150 MG tablet   Oral   Take 1 tablet (150 mg total) by mouth 2 (two) times daily.   60 tablet   0   . sulfamethoxazole-trimethoprim (SEPTRA DS) 800-160 MG per tablet   Oral   Take 1 tablet by mouth 2 (two) times daily.   28 tablet   0    BP 138/69  Pulse 87  Temp(Src) 98.2 F (36.8 C) (Oral)  Resp 17  Ht 5\' 9"  (1.753 m)  Wt 172 lb (78.019 kg)  BMI 25.39 kg/m2  SpO2 98%  LMP 08/12/2010 Physical Exam  Nursing note and vitals reviewed.  53 year old female, resting comfortably and in no acute distress. Vital signs are normal.  Oxygen saturation is 98%, which is normal. Head is normocephalic and atraumatic. PERRLA, EOMI. Oropharynx is mildly erythematous without exudate. Examination of the nasal cavity shows mild edema and slight mucoid drainage from the turbinates on the left. Neck is nontender and supple without adenopathy or JVD. Back is nontender and there is no CVA tenderness. Lungs are clear without rales, wheezes, or rhonchi. Chest is nontender. Heart has regular rate and rhythm without murmur. Abdomen is soft, flat, nontender without masses or hepatosplenomegaly and peristalsis is normoactive. Extremities have no cyanosis or edema, full range of motion is present. Skin is warm and dry without rash. Neurologic: Mental status is normal, cranial nerves are intact, there are no motor or sensory deficits.  ED Course  Procedures (including critical care time)  MDM   Final diagnoses:  Upper respiratory infection    Upper respiratory infection which is likely to be influenza. Strep screen will be obtained and she's given a dose of dexamethasone.    Delora Fuel, MD 09/47/09 [id number]

## 2013-06-19 NOTE — ED Notes (Signed)
Pt c/o sore throat and nasal congestion.

## 2013-06-21 LAB — CULTURE, GROUP A STREP

## 2013-06-24 ENCOUNTER — Emergency Department (HOSPITAL_COMMUNITY): Payer: Self-pay

## 2013-06-24 ENCOUNTER — Emergency Department (HOSPITAL_COMMUNITY)
Admission: EM | Admit: 2013-06-24 | Discharge: 2013-06-24 | Disposition: A | Discharge status: Home / Self Care | Payer: Self-pay | Attending: Emergency Medicine | Admitting: Emergency Medicine

## 2013-06-24 ENCOUNTER — Encounter (HOSPITAL_COMMUNITY): Payer: Self-pay | Admitting: Emergency Medicine

## 2013-06-24 DIAGNOSIS — J40 Bronchitis, not specified as acute or chronic: Secondary | ICD-10-CM | POA: Insufficient documentation

## 2013-06-24 DIAGNOSIS — F17200 Nicotine dependence, unspecified, uncomplicated: Secondary | ICD-10-CM | POA: Insufficient documentation

## 2013-06-24 DIAGNOSIS — I10 Essential (primary) hypertension: Secondary | ICD-10-CM | POA: Insufficient documentation

## 2013-06-24 DIAGNOSIS — Z880 Allergy status to penicillin: Secondary | ICD-10-CM | POA: Insufficient documentation

## 2013-06-24 DIAGNOSIS — Z862 Personal history of diseases of the blood and blood-forming organs and certain disorders involving the immune mechanism: Secondary | ICD-10-CM | POA: Insufficient documentation

## 2013-06-24 DIAGNOSIS — Z79899 Other long term (current) drug therapy: Secondary | ICD-10-CM | POA: Insufficient documentation

## 2013-06-24 DIAGNOSIS — Z8639 Personal history of other endocrine, nutritional and metabolic disease: Secondary | ICD-10-CM | POA: Insufficient documentation

## 2013-06-24 IMAGING — CR DG CHEST 2V
2 series · 2 of 2 positions shown · non-contrast
Comparison: DG ABD ACUTE W/CHEST dated [DATE]

CLINICAL DATA: Cough, chest pain

EXAM:
CHEST  2 VIEW

[view not recorded (1 of 2)]
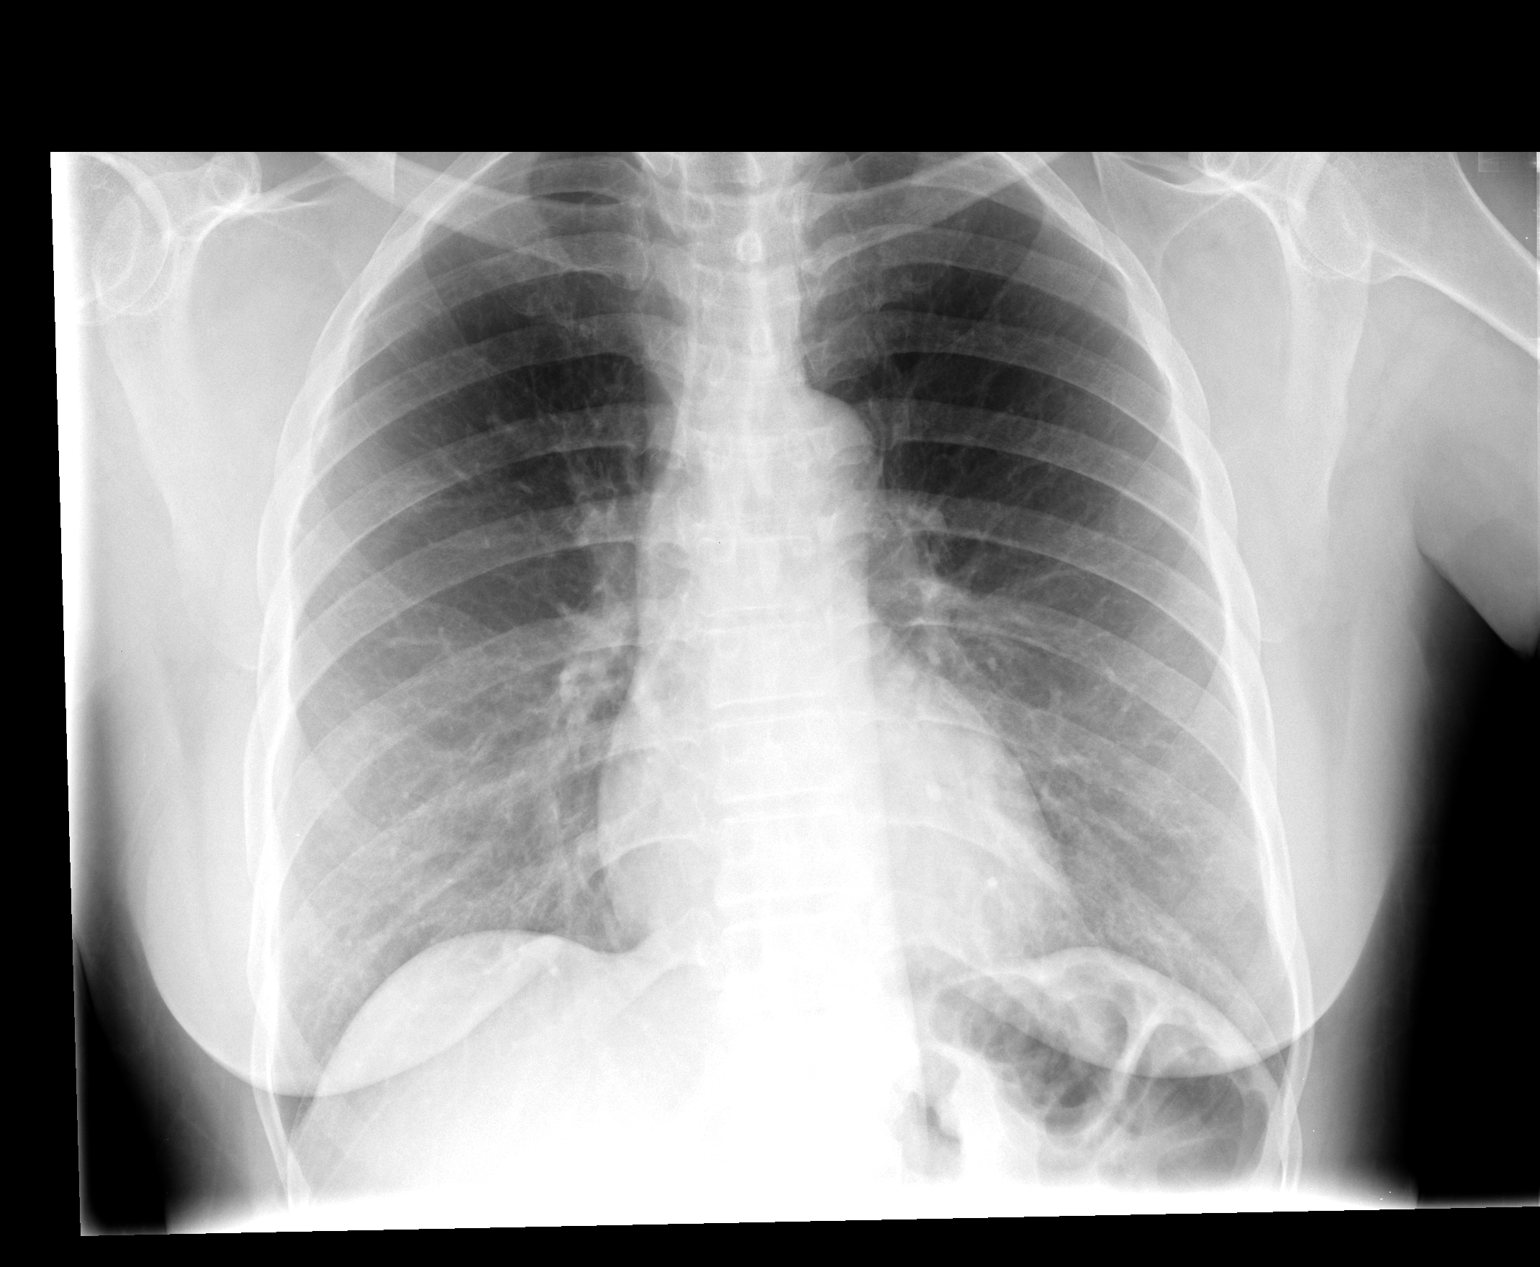

[view not recorded (2 of 2)]
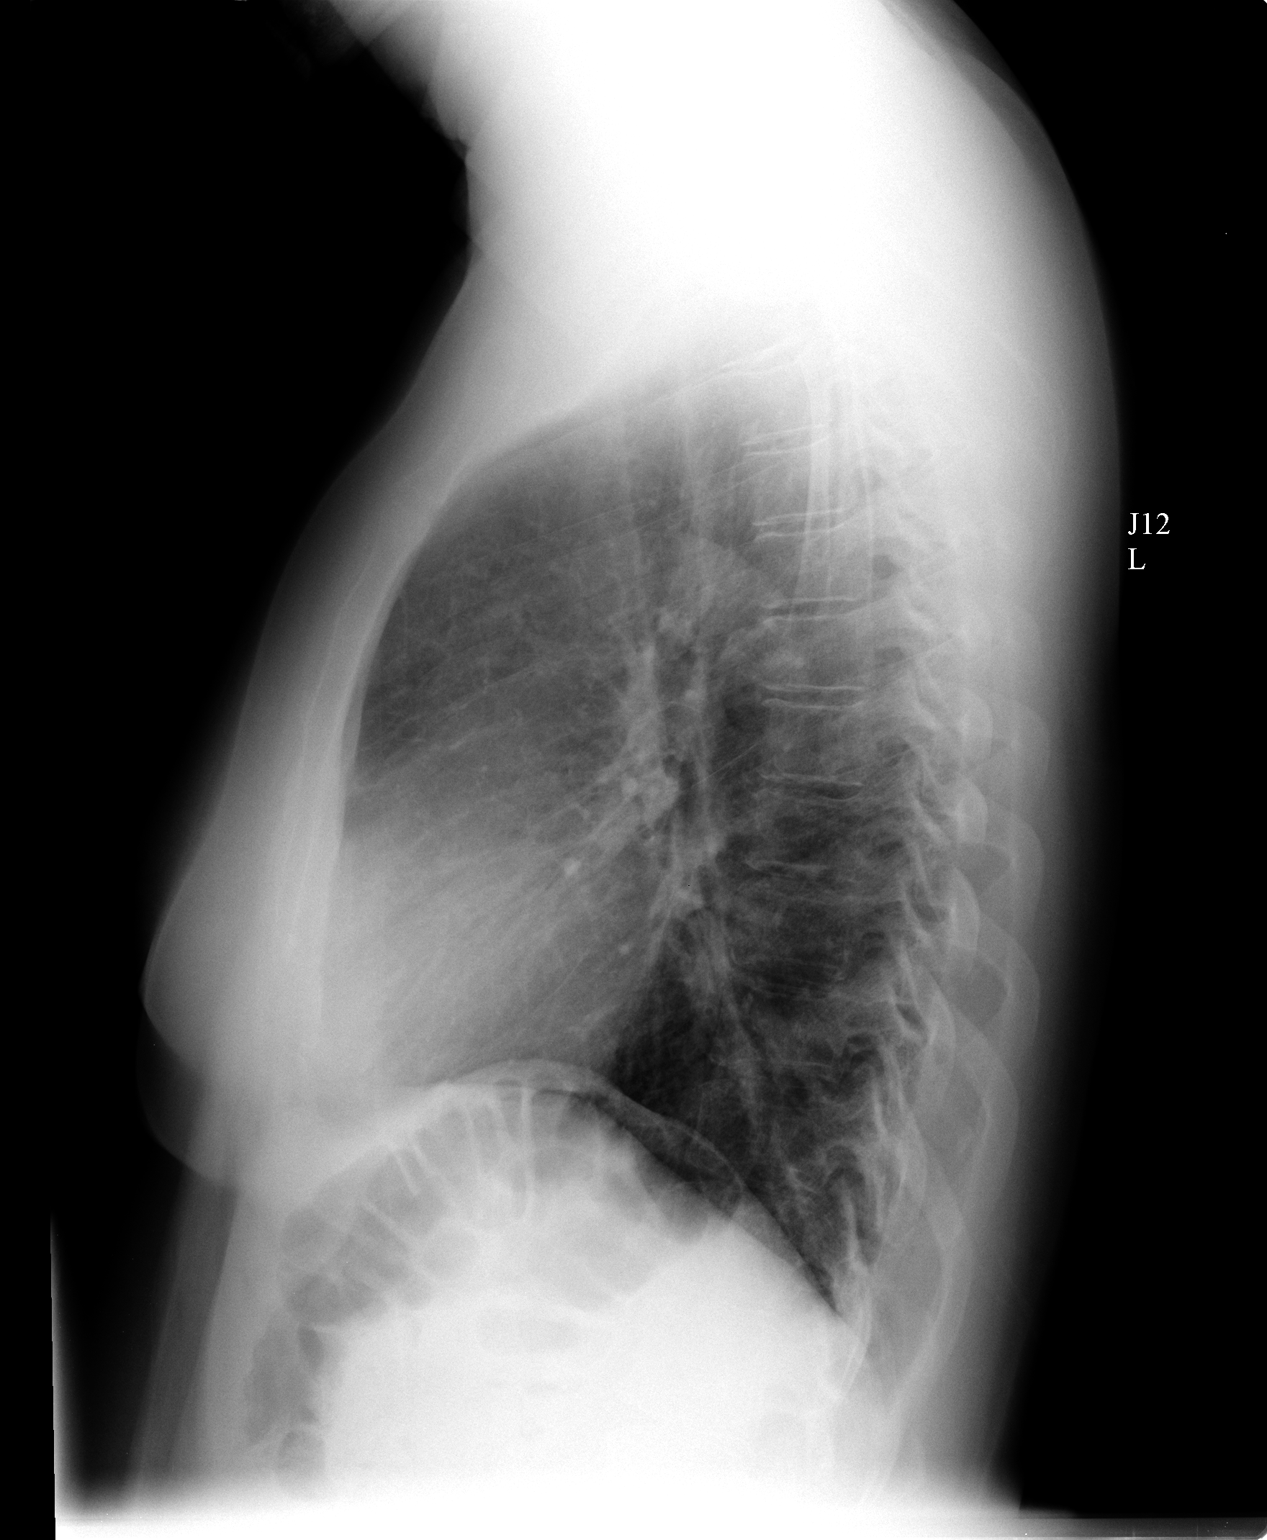

[2 of 2 positions shown; findings below may reference images not displayed]

FINDINGS: Normal mediastinum and cardiac silhouette. Normal pulmonary
vasculature. No evidence of effusion, infiltrate, or pneumothorax.
No acute bony abnormality.
IMPRESSION: No acute cardiopulmonary process.

## 2013-06-24 MED ORDER — SULFAMETHOXAZOLE-TMP DS 800-160 MG PO TABS
1.0000 | ORAL_TABLET | Freq: Once | ORAL | Status: AC
Start: 2013-06-24 — End: 2013-06-24
  Administered 2013-06-24: 1 via ORAL
  Filled 2013-06-24: qty 1

## 2013-06-24 MED ORDER — SULFAMETHOXAZOLE-TRIMETHOPRIM 800-160 MG PO TABS: 1.0000 | ORAL_TABLET | Freq: Two times a day (BID) | ORAL | Status: AC

## 2013-06-24 NOTE — Discharge Instructions (Signed)
Bronchitis Bronchitis is inflammation of the airways that extend from the windpipe into the lungs (bronchi). The inflammation often causes mucus to develop, which leads to a cough. If the inflammation becomes severe, it may cause shortness of breath. CAUSES  Bronchitis may be caused by:   Viral infections.   Bacteria.   Cigarette smoke.   Allergens, pollutants, and other irritants.  SIGNS AND SYMPTOMS  The most common symptom of bronchitis is a frequent cough that produces mucus. Other symptoms include:  Fever.   Body aches.   Chest congestion.   Chills.   Shortness of breath.   Sore throat.  DIAGNOSIS  Bronchitis is usually diagnosed through a medical history and physical exam. Tests, such as chest X-rays, are sometimes done to rule out other conditions.  TREATMENT  You may need to avoid contact with whatever caused the problem (smoking, for example). Medicines are sometimes needed. These may include:  Antibiotics. These may be prescribed if the condition is caused by bacteria.  Cough suppressants. These may be prescribed for relief of cough symptoms.   Inhaled medicines. These may be prescribed to help open your airways and make it easier for you to breathe.   Steroid medicines. These may be prescribed for those with recurrent (chronic) bronchitis. HOME CARE INSTRUCTIONS  Get plenty of rest.   Drink enough fluids to keep your urine clear or pale yellow (unless you have a medical condition that requires fluid restriction). Increasing fluids may help thin your secretions and will prevent dehydration.   Only take over-the-counter or prescription medicines as directed by your health care provider.  Only take antibiotics as directed. Make sure you finish them even if you start to feel better.  Avoid secondhand smoke, irritating chemicals, and strong fumes. These will make bronchitis worse. If you are a smoker, quit smoking. Consider using nicotine gum or  skin patches to help control withdrawal symptoms. Quitting smoking will help your lungs heal faster.   Put a cool-mist humidifier in your bedroom at night to moisten the air. This may help loosen mucus. Change the water in the humidifier daily. You can also run the hot water in your shower and sit in the bathroom with the door closed for 5 10 minutes.   Follow up with your health care provider as directed.   Wash your hands frequently to avoid catching bronchitis again or spreading an infection to others.  SEEK MEDICAL CARE IF: Your symptoms do not improve after 1 week of treatment.  SEEK IMMEDIATE MEDICAL CARE IF:  Your fever increases.  You have chills.   You have chest pain.   You have worsening shortness of breath.   You have bloody sputum.  You faint.  You have lightheadedness.  You have a severe headache.   You vomit repeatedly. MAKE SURE YOU:   Understand these instructions.  Will watch your condition.  Will get help right away if you are not doing well or get worse. Document Released: 03/19/2005 Document Revised: 01/07/2013 Document Reviewed: 11/11/2012 [facility] Patient Information 2014 [place].    Use humidifier or steamy bathroom Drink a lot of fluids Use albuterol as needed Return if high fever, shortness of breath or wheezing

## 2013-06-24 NOTE — ED Provider Notes (Signed)
CSN: [id number]     Arrival date & time 06/24/13  0747 History   First MD Initiated Contact with Patient 06/24/13 0825     Chief Complaint  Patient presents with  . URI     (Consider location/radiation/quality/duration/timing/severity/associated sxs/prior Treatment) Patient is a 53 y.o. female presenting with URI. The history is provided by the patient. No language interpreter was used.  URI Presenting symptoms: congestion, cough and rhinorrhea   Cough:    Cough characteristics:  Productive   Sputum characteristics:  Green   Severity:  Moderate   Duration:  7 days   Timing:  Intermittent   Progression:  Worsening  patient is a 53 year old female who presents with cough and congestion. She reports that she was seen here 4-5 days ago for an upper respiratory infection and given steroids. She feels like she has worsened since that time and is coughing up green sputum. She reports that she has had a slight wheeze. She denies any shortness of breath, difficulty breathing, fever or chills.   Past Medical History  Diagnosis Date  . Sickle cell trait   . Hypertension     does not take meds  . Blood dyscrasia     sickle cell trait  . Hypercholesterolemia    Past Surgical History  Procedure Laterality Date  . Appendectomy    . Incision and drainage perirectal abscess  12/21/09  . Cesarean section      x 2  . Proctoscopy  10/17/2010    Procedure: PROCTOSCOPY;  Surgeon: Scherry Ran;  Location: [facility];  Service: General;  Laterality: N/A;  Rigid Proctoscopy/Possible Fistula in Ano  Procedure ended at 1003  . Therapeutic abortion      x2  . Carpal tunnel release  03/23/2011    Procedure: CARPAL TUNNEL RELEASE;  Surgeon: Sanjuana Kava;  Location: [facility];  Service: Orthopedics;  Laterality: Left;  . Carpal tunnel release  05/10/2011    Procedure: CARPAL TUNNEL RELEASE;  Surgeon: Sanjuana Kava, MD;  Location: [facility];  Service: Orthopedics;  Laterality: Right;   Family History   Problem Relation Age of Onset  . Kidney failure Daughter   . Sickle cell anemia Daughter   . Sickle cell anemia Son   . Anesthesia problems Neg Hx   . Hypotension Neg Hx   . Malignant hyperthermia Neg Hx   . Pseudochol deficiency Neg Hx    History  Substance Use Topics  . Smoking status: Current Every Day Smoker -- 0.50 packs/day for 30 years    Types: Cigarettes  . Smokeless tobacco: Not on file  . Alcohol Use: No     Comment: one year and three months   OB History   Grav Para Term Preterm Abortions TAB SAB Ect Mult Living   4 2 2  2           Review of Systems  HENT: Positive for congestion and rhinorrhea.   Respiratory: Positive for cough.   Cardiovascular: Negative for chest pain.  Gastrointestinal: Negative for nausea, vomiting, abdominal pain and diarrhea.  Neurological: Negative for dizziness and weakness.  All other systems reviewed and are negative.      Allergies  Penicillins; Penicillins cross reactors; and Tape  Home Medications   Current Outpatient Rx  Name  Route  Sig  Dispense  Refill  . cyclobenzaprine (FLEXERIL) 5 MG tablet   Oral   Take 1 tablet (5 mg total) by mouth 3 (three) times daily as needed for muscle spasms.  15 tablet   0   . HYDROcodone-acetaminophen (NORCO/VICODIN) 5-325 MG per tablet   Oral   Take 1 tablet by mouth every 4 (four) hours as needed.   10 tablet   0   . HYDROcodone-acetaminophen (NORCO/VICODIN) 5-325 MG per tablet      Take one-two tabs po q 4-6 hrs prn pain   12 tablet   0   . lisinopril (PRINIVIL,ZESTRIL) 10 MG tablet   Oral   Take 10 mg by mouth daily.         . Multiple Vitamin (MULTIVITAMIN WITH MINERALS) TABS   Oral   Take 1 tablet by mouth daily.         . polyethylene glycol (MIRALAX / GLYCOLAX) packet   Oral   Take 17 g by mouth daily as needed for mild constipation.         . ranitidine (ZANTAC) 150 MG tablet   Oral   Take 1 tablet (150 mg total) by mouth 2 (two) times daily.   60  tablet   0   . sulfamethoxazole-trimethoprim (BACTRIM DS,SEPTRA DS) 800-160 MG per tablet   Oral   Take 1 tablet by mouth 2 (two) times daily.   13 tablet   0   . sulfamethoxazole-trimethoprim (SEPTRA DS) 800-160 MG per tablet   Oral   Take 1 tablet by mouth 2 (two) times daily.   28 tablet   0    BP 140/74  Pulse 99  Temp(Src) 98.4 F (36.9 C) (Oral)  Resp 18  Ht 5\' 9"  (1.753 m)  Wt 170 lb (77.111 kg)  BMI 25.09 kg/m2  SpO2 98%  LMP 08/12/2010 Physical Exam  Nursing note and vitals reviewed. Constitutional: She is oriented to person, place, and time. She appears well-developed and well-nourished. No distress.  HENT:  Head: Normocephalic and atraumatic.  Right Ear: External ear normal.  Left Ear: External ear normal.  Nose: Mucosal edema present.  Mouth/Throat: Oropharynx is clear and moist.  Eyes: Conjunctivae and EOM are normal.  Neck: Normal range of motion. Neck supple. No JVD present. No tracheal deviation present. No thyromegaly present.  Cardiovascular: Normal rate, regular rhythm and normal heart sounds.   Pulmonary/Chest: Effort normal and breath sounds normal. No respiratory distress. She has no wheezes.  Lymphadenopathy:    She has no cervical adenopathy.  Neurological: She is alert and oriented to person, place, and time. No cranial nerve deficit. Coordination normal.  Skin: Skin is warm and dry. No rash noted.  Psychiatric: She has a normal mood and affect. Her behavior is normal. Judgment and thought content normal.    ED Course  Procedures (including critical care time) Labs Review Labs Reviewed - No data to display Imaging Review No results found.   EKG Interpretation None      MDM   Final diagnoses:  Bronchitis    Chest x-ray; negative. Afebrile. Cough, productive. Pt seen a few days ago and given steroids with no improvement. Prescription for Bactrim DS given. Discussed plan of care with pt. Return precautions given.       Elisha Headland, NP 06/27/13 [id number]

## 2013-06-24 NOTE — ED Notes (Signed)
Pt reports productive cough with yellow sputum x 5 days.  Reports was seen here 4 days ago and was given a dose of steroid medication.  Denies fever.

## 2013-07-01 NOTE — ED Provider Notes (Signed)
Medical screening examination/treatment/procedure(s) were performed by non-physician practitioner and as supervising physician I was immediately available for consultation/collaboration.   EKG Interpretation None        Maudry Diego, MD 07/01/13 [phone]

## 2013-07-08 ENCOUNTER — Encounter (HOSPITAL_COMMUNITY): Payer: Self-pay | Admitting: Emergency Medicine

## 2013-07-08 ENCOUNTER — Emergency Department (HOSPITAL_COMMUNITY)
Admission: EM | Admit: 2013-07-08 | Discharge: 2013-07-08 | Disposition: A | Discharge status: Home / Self Care | Payer: Self-pay | Attending: Emergency Medicine | Admitting: Emergency Medicine

## 2013-07-08 DIAGNOSIS — R110 Nausea: Secondary | ICD-10-CM | POA: Insufficient documentation

## 2013-07-08 DIAGNOSIS — K625 Hemorrhage of anus and rectum: Secondary | ICD-10-CM | POA: Insufficient documentation

## 2013-07-08 DIAGNOSIS — Z79899 Other long term (current) drug therapy: Secondary | ICD-10-CM | POA: Insufficient documentation

## 2013-07-08 DIAGNOSIS — Z862 Personal history of diseases of the blood and blood-forming organs and certain disorders involving the immune mechanism: Secondary | ICD-10-CM | POA: Insufficient documentation

## 2013-07-08 DIAGNOSIS — Z9889 Other specified postprocedural states: Secondary | ICD-10-CM | POA: Insufficient documentation

## 2013-07-08 DIAGNOSIS — Z792 Long term (current) use of antibiotics: Secondary | ICD-10-CM | POA: Insufficient documentation

## 2013-07-08 DIAGNOSIS — R109 Unspecified abdominal pain: Secondary | ICD-10-CM | POA: Insufficient documentation

## 2013-07-08 DIAGNOSIS — Z8639 Personal history of other endocrine, nutritional and metabolic disease: Secondary | ICD-10-CM | POA: Insufficient documentation

## 2013-07-08 DIAGNOSIS — I10 Essential (primary) hypertension: Secondary | ICD-10-CM | POA: Insufficient documentation

## 2013-07-08 DIAGNOSIS — F17200 Nicotine dependence, unspecified, uncomplicated: Secondary | ICD-10-CM | POA: Insufficient documentation

## 2013-07-08 DIAGNOSIS — Z880 Allergy status to penicillin: Secondary | ICD-10-CM | POA: Insufficient documentation

## 2013-07-08 LAB — COMPREHENSIVE METABOLIC PANEL
ALBUMIN: 3.2 g/dL — AB (ref 3.5–5.2)
ALT: 10 U/L (ref 0–35)
AST: 15 U/L (ref 0–37)
Alkaline Phosphatase: 79 U/L (ref 39–117)
BUN: 10 mg/dL (ref 6–23)
CHLORIDE: 105 meq/L (ref 96–112)
CO2: 23 mEq/L (ref 19–32)
CREATININE: 0.76 mg/dL (ref 0.50–1.10)
Calcium: 9.5 mg/dL (ref 8.4–10.5)
GFR calc Af Amer: >90 mL/min (ref >90)
GFR calc non Af Amer: >90 mL/min (ref >90)
Glucose, Bld: 121 mg/dL — ABNORMAL HIGH (ref 70–99)
Potassium: 3.7 mEq/L (ref 3.7–5.3)
Sodium: 140 mEq/L (ref 137–147)
Total Bilirubin: 0.2 mg/dL — ABNORMAL LOW (ref 0.3–1.2)
Total Protein: 7.3 g/dL (ref 6.0–8.3)

## 2013-07-08 LAB — CBC WITH DIFFERENTIAL/PLATELET
BASOS ABS: 0 10*3/uL (ref 0.0–0.1)
BASOS PCT: 0 % (ref 0–1)
Eosinophils Absolute: 0.1 10*3/uL (ref 0.0–0.7)
Eosinophils Relative: 1 % (ref 0–5)
HCT: 30.9 % — ABNORMAL LOW (ref 36.0–46.0)
Hemoglobin: 10.3 g/dL — ABNORMAL LOW (ref 12.0–15.0)
Lymphocytes Relative: 28 % (ref 12–46)
Lymphs Abs: 1.5 10*3/uL (ref 0.7–4.0)
MCH: 28.5 pg (ref 26.0–34.0)
MCHC: 33.3 g/dL (ref 30.0–36.0)
MCV: 85.4 fL (ref 78.0–100.0)
Monocytes Absolute: 0.2 10*3/uL (ref 0.1–1.0)
Monocytes Relative: 5 % (ref 3–12)
NEUTROS ABS: 3.5 10*3/uL (ref 1.7–7.7)
Neutrophils Relative %: 66 % (ref 43–77)
Platelets: 288 10*3/uL (ref 150–400)
RBC: 3.62 MIL/uL — ABNORMAL LOW (ref 3.87–5.11)
RDW: 12.6 % (ref 11.5–15.5)
WBC: 5.4 10*3/uL (ref 4.0–10.5)

## 2013-07-08 MED ORDER — HYDROCORTISONE ACETATE 25 MG RE SUPP: 25.0000 mg | Freq: Two times a day (BID) | RECTAL | Status: DC

## 2013-07-08 NOTE — ED Notes (Signed)
Pt states hemorrhoid pain since yesterday. Noticing bright  Red blood with bowel movements.

## 2013-07-08 NOTE — ED Provider Notes (Signed)
CSN: [id number]     Arrival date & time 07/08/13  0814 History  This chart was scribed for Veryl Speak, MD by Zettie Pho, ED Scribe. This patient was seen in room APA07/APA07 and the patient's care was started at 8:14 AM.    Chief Complaint  Patient presents with  . Hemorrhoids   The history is provided by the patient. No language interpreter was used.   HPI Comments: Amber Drake is a 53 y.o. Female with a history of hemorrhoids who presents to the Emergency Department complaining of noticing some bright red blood in the toilet after a BM with associated mild nausea onset yesterday. Patient reports some associated lower abdominal pain yesterday that she states has since resolved. She states her current symptoms feel similar to her prior hemorrhoid flare-ups. Patient denies rectal pain, fever, lightheadedness, dizziness, shortness of breath. She reports allergies to penicillins/cross reactors and tape. Patient has a surgical history of perirectal abscess with incision and drainage, appendectomy, cesarean section, and proctoscopy. Patient has a history of sickle cell trait/blood dyscrasia, HTN, and hypercholesterolemia.   Past Medical History  Diagnosis Date  . Sickle cell trait   . Hypertension     does not take meds  . Blood dyscrasia     sickle cell trait  . Hypercholesterolemia    Past Surgical History  Procedure Laterality Date  . Appendectomy    . Incision and drainage perirectal abscess  12/21/09  . Cesarean section      x 2  . Proctoscopy  10/17/2010    Procedure: PROCTOSCOPY;  Surgeon: Scherry Ran;  Location: [facility];  Service: General;  Laterality: N/A;  Rigid Proctoscopy/Possible Fistula in Ano  Procedure ended at 1003  . Therapeutic abortion      x2  . Carpal tunnel release  03/23/2011    Procedure: CARPAL TUNNEL RELEASE;  Surgeon: Sanjuana Kava;  Location: [facility];  Service: Orthopedics;  Laterality: Left;  . Carpal tunnel release  05/10/2011    Procedure: CARPAL  TUNNEL RELEASE;  Surgeon: Sanjuana Kava, MD;  Location: [facility];  Service: Orthopedics;  Laterality: Right;   Family History  Problem Relation Age of Onset  . Kidney failure Daughter   . Sickle cell anemia Daughter   . Sickle cell anemia Son   . Anesthesia problems Neg Hx   . Hypotension Neg Hx   . Malignant hyperthermia Neg Hx   . Pseudochol deficiency Neg Hx    History  Substance Use Topics  . Smoking status: Current Every Day Smoker -- 0.50 packs/day for 30 years    Types: Cigarettes  . Smokeless tobacco: Not on file  . Alcohol Use: No     Comment: one year and three months   OB History   Grav Para Term Preterm Abortions TAB SAB Ect Mult Living   4 2 2  2           Review of Systems  A complete 10 system review of systems was obtained and all systems are negative except as noted in the HPI and PMH.    Allergies  Penicillins; Penicillins cross reactors; and Tape  Home Medications   Current Outpatient Rx  Name  Route  Sig  Dispense  Refill  . cyclobenzaprine (FLEXERIL) 5 MG tablet   Oral   Take 1 tablet (5 mg total) by mouth 3 (three) times daily as needed for muscle spasms.   15 tablet   0   . HYDROcodone-acetaminophen (NORCO/VICODIN) 5-325 MG per tablet  Oral   Take 1 tablet by mouth every 4 (four) hours as needed.   10 tablet   0   . HYDROcodone-acetaminophen (NORCO/VICODIN) 5-325 MG per tablet      Take one-two tabs po q 4-6 hrs prn pain   12 tablet   0   . lisinopril (PRINIVIL,ZESTRIL) 10 MG tablet   Oral   Take 10 mg by mouth daily.         . Multiple Vitamin (MULTIVITAMIN WITH MINERALS) TABS   Oral   Take 1 tablet by mouth daily.         . polyethylene glycol (MIRALAX / GLYCOLAX) packet   Oral   Take 17 g by mouth daily as needed for mild constipation.         . ranitidine (ZANTAC) 150 MG tablet   Oral   Take 1 tablet (150 mg total) by mouth 2 (two) times daily.   60 tablet   0   . sulfamethoxazole-trimethoprim (SEPTRA DS) 800-160  MG per tablet   Oral   Take 1 tablet by mouth 2 (two) times daily.   28 tablet   0    Triage Vitals: BP 121/71  Pulse 87  Temp(Src) 98.3 F (36.8 C) (Oral)  Resp 16  Ht 5\' 9"  (1.753 m)  Wt 172 lb (78.019 kg)  BMI 25.39 kg/m2  SpO2 98%  LMP 08/12/2010  Physical Exam  Nursing note and vitals reviewed. Constitutional: She is oriented to person, place, and time. She appears well-developed and well-nourished. No distress.  HENT:  Head: Normocephalic and atraumatic.  Eyes: Conjunctivae are normal.  Neck: Normal range of motion. Neck supple.  Pulmonary/Chest: Effort normal. No respiratory distress.  Abdominal: Soft. She exhibits no distension and no mass. There is no tenderness. There is no guarding.  Genitourinary: Rectal exam shows no external hemorrhoid, no fissure, no mass, no tenderness and anal tone normal.  Exam was performed with patient's permission. Chaperone (scribe) was present. The exam was performed with no discomfort or complications.  Musculoskeletal: Normal range of motion.  Neurological: She is alert and oriented to person, place, and time.  Skin: Skin is warm and dry.  Psychiatric: She has a normal mood and affect. Her behavior is normal.    ED Course  Procedures (including critical care time)  DIAGNOSTIC STUDIES: Oxygen Saturation is 98% on room air, normal by my interpretation.    COORDINATION OF CARE: 8:22 AM- Performed rectal exam. Will order CBC and CMP. Discussed treatment plan with patient at bedside and patient verbalized agreement.     Labs Review Labs Reviewed  CBC WITH DIFFERENTIAL - Abnormal; Notable for the following:    RBC 3.62 (*)    Hemoglobin 10.3 (*)    HCT 30.9 (*)    All other components within normal limits  COMPREHENSIVE METABOLIC PANEL - Abnormal; Notable for the following:    Glucose, Bld 121 (*)    Albumin 3.2 (*)    Total Bilirubin 0.2 (*)    All other components within normal limits   Imaging Review No results  found.   EKG Interpretation None      MDM   Final diagnoses:  None    Patient is a 53 year old female who presents with complaints of rectal bleeding since last night. She denies any discomfort, pain, or fever. She also states she has not been passing any hard stools. She has a history of internal hemorrhoids and believes that she is bleeding from these. Rectal examination reveals  no abnormalities. There is some bright red blood noted. Remainder of the workup reveals a hemoglobin of 10.3 which is not far from her baseline and she is not having any symptoms with this. At this point I feel as though she is stable for discharge. I will treat with steroid suppositories for presumptive flareup of internal hemorrhoids. If her bleeding worsens she understands to return to the ER if this does not resolve she is to followup with her gastroenterologist. She tells me she had a colonoscopy performed approximately 2-3 years ago which revealed internal hemorrhoids, however no other acute abnormality.  I personally performed the services described in this documentation, which was scribed in my presence. The recorded information has been reviewed and is accurate.       Veryl Speak, MD 07/08/13 [phone]

## 2013-07-08 NOTE — Care Management Note (Signed)
ED/CM noted patient did not have health insurance and/or PCP listed in the computer.  Patient was given the [facility] with information on the clinics, food pantries, and the handout for new health insurance sign-up.  Patient expressed appreciation for information received. Pt was given a prescription discount card, also.

## 2013-07-08 NOTE — Discharge Instructions (Signed)
Anusol suppositories as prescribed.  Return to the ER for worsening bleeding, dizziness, or difficulty breathing.  Followup with your gastroenterologist if this is not improving in the next 2 days.   Rectal Bleeding Rectal bleeding is when blood passes out of the anus. It is usually a sign that something is wrong. It may not be serious, but it should always be evaluated. Rectal bleeding may present as bright red blood or extremely dark stools. The color may range from dark red or maroon to black (like tar). It is important that the cause of rectal bleeding be identified so treatment can be started and the problem corrected. CAUSES   Hemorrhoids. These are enlarged (dilated) blood vessels or veins in the anal or rectal area.  Fistulas. Theseare abnormal, burrowing channels that usually run from inside the rectum to the skin around the anus. They can bleed.  Anal fissures. This is a tear in the tissue of the anus. Bleeding occurs with bowel movements.  Diverticulosis. This is a condition in which pockets or sacs project from the bowel wall. Occasionally, the sacs can bleed.  Diverticulitis. Thisis an infection involving diverticulosis of the colon.  Proctitis and colitis. These are conditions in which the rectum, colon, or both, can become inflamed and pitted (ulcerated).  Polyps and cancer. Polyps are non-cancerous (benign) growths in the colon that may bleed. Certain types of polyps turn into cancer.  Protrusion of the rectum. Part of the rectum can project from the anus and bleed.  Certain medicines.  Intestinal infections.  Blood vessel abnormalities. HOME CARE INSTRUCTIONS  Eat a high-fiber diet to keep your stool soft.  Limit activity.  Drink enough fluids to keep your urine clear or pale yellow.  Warm baths may be useful to soothe rectal pain.  Follow up with your caregiver as directed. SEEK IMMEDIATE MEDICAL CARE IF:  You develop increased bleeding.  You have  black or dark red stools.  You vomit blood or material that looks like coffee grounds.  You have abdominal pain or tenderness.  You have a fever.  You feel weak, nauseous, or you faint.  You have severe rectal pain or you are unable to have a bowel movement. MAKE SURE YOU:  Understand these instructions.  Will watch your condition.  Will get help right away if you are not doing well or get worse. Document Released: 09/08/2001 Document Revised: 06/11/2011 Document Reviewed: 09/03/2010 [facility] Patient Information 2014 [place], [place].

## 2013-07-09 ENCOUNTER — Encounter (HOSPITAL_COMMUNITY): Payer: Self-pay | Admitting: Emergency Medicine

## 2013-07-09 ENCOUNTER — Emergency Department (HOSPITAL_COMMUNITY)
Admission: EM | Admit: 2013-07-09 | Discharge: 2013-07-09 | Disposition: A | Discharge status: Home / Self Care | Payer: Self-pay | Attending: Emergency Medicine | Admitting: Emergency Medicine

## 2013-07-09 DIAGNOSIS — R110 Nausea: Secondary | ICD-10-CM | POA: Insufficient documentation

## 2013-07-09 DIAGNOSIS — Z9089 Acquired absence of other organs: Secondary | ICD-10-CM | POA: Insufficient documentation

## 2013-07-09 DIAGNOSIS — I10 Essential (primary) hypertension: Secondary | ICD-10-CM | POA: Insufficient documentation

## 2013-07-09 DIAGNOSIS — Z3202 Encounter for pregnancy test, result negative: Secondary | ICD-10-CM | POA: Insufficient documentation

## 2013-07-09 DIAGNOSIS — Z79899 Other long term (current) drug therapy: Secondary | ICD-10-CM | POA: Insufficient documentation

## 2013-07-09 DIAGNOSIS — F17200 Nicotine dependence, unspecified, uncomplicated: Secondary | ICD-10-CM | POA: Insufficient documentation

## 2013-07-09 DIAGNOSIS — IMO0002 Reserved for concepts with insufficient information to code with codable children: Secondary | ICD-10-CM | POA: Insufficient documentation

## 2013-07-09 DIAGNOSIS — R1032 Left lower quadrant pain: Secondary | ICD-10-CM | POA: Insufficient documentation

## 2013-07-09 DIAGNOSIS — Z8639 Personal history of other endocrine, nutritional and metabolic disease: Secondary | ICD-10-CM | POA: Insufficient documentation

## 2013-07-09 DIAGNOSIS — R1031 Right lower quadrant pain: Secondary | ICD-10-CM | POA: Insufficient documentation

## 2013-07-09 DIAGNOSIS — Z862 Personal history of diseases of the blood and blood-forming organs and certain disorders involving the immune mechanism: Secondary | ICD-10-CM | POA: Insufficient documentation

## 2013-07-09 DIAGNOSIS — Z880 Allergy status to penicillin: Secondary | ICD-10-CM | POA: Insufficient documentation

## 2013-07-09 DIAGNOSIS — K648 Other hemorrhoids: Secondary | ICD-10-CM | POA: Insufficient documentation

## 2013-07-09 DIAGNOSIS — R109 Unspecified abdominal pain: Secondary | ICD-10-CM

## 2013-07-09 DIAGNOSIS — K625 Hemorrhage of anus and rectum: Secondary | ICD-10-CM | POA: Insufficient documentation

## 2013-07-09 LAB — I-STAT CHEM 8, ED
BUN: 7 mg/dL (ref 6–23)
CHLORIDE: 104 meq/L (ref 96–112)
Calcium, Ion: 1.26 mmol/L — ABNORMAL HIGH (ref 1.12–1.23)
Creatinine, Ser: 0.8 mg/dL (ref 0.50–1.10)
Glucose, Bld: 109 mg/dL — ABNORMAL HIGH (ref 70–99)
HCT: 34 % — ABNORMAL LOW (ref 36.0–46.0)
Hemoglobin: 11.6 g/dL — ABNORMAL LOW (ref 12.0–15.0)
POTASSIUM: 3.4 meq/L — AB (ref 3.7–5.3)
SODIUM: 143 meq/L (ref 137–147)
TCO2: 24 mmol/L (ref 0–100)

## 2013-07-09 LAB — URINALYSIS, ROUTINE W REFLEX MICROSCOPIC
Bilirubin Urine: NEGATIVE
GLUCOSE, UA: NEGATIVE mg/dL
Ketones, ur: NEGATIVE mg/dL
LEUKOCYTES UA: NEGATIVE
Nitrite: NEGATIVE
PH: 6 (ref 5.0–8.0)
Protein, ur: NEGATIVE mg/dL
SPECIFIC GRAVITY, URINE: 1.025 (ref 1.005–1.030)
Urobilinogen, UA: 0.2 mg/dL (ref 0.0–1.0)

## 2013-07-09 LAB — URINE MICROSCOPIC-ADD ON

## 2013-07-09 LAB — POC URINE PREG, ED: Preg Test, Ur: NEGATIVE

## 2013-07-09 NOTE — ED Provider Notes (Signed)
CSN: [id number]     Arrival date & time 07/09/13  1748 History  This chart was scribed for Sharyon Cable, MD by Jenne Campus, ED Scribe. This patient was seen in room APA04/APA04 and the patient's care was started at 6:04 PM.   Chief Complaint  Patient presents with  . Abdominal Pain  . Hemorrhoids     Patient is a 53 y.o. female presenting with abdominal pain. The history is provided by the patient. No language interpreter was used.  Abdominal Pain Pain location:  LLQ and RLQ Pain quality: cramping   Onset quality:  Gradual Duration:  1 day Progression:  Waxing and waning Chronicity:  New Context: laxative use   Associated symptoms: hematochezia and nausea   Associated symptoms: no diarrhea, no hematemesis, no vaginal bleeding and no vomiting     HPI Comments: Amber Drake is a 53 y.o. female who presents to the Emergency Department complaining of lower abdominal pain described as the urge to have a BM that started today with associated nausea and continued bright red rectal bleeding that she was seen in the ED for yesterday. She was diagnosed with internal hemorrhoids which pt states that she has a history of and was prescribed suppositories which she was unable to fill due to the cost. She states that she has been using laxatives since yesterday with 3 subsequent 3 BMs. She denies that all the stools have been bloody or hard and she denies having rectal pain with passing stool. She denies any melena, vaginal bleeding, dysuria or emesis as associated symptoms. She denies being on any anticoagulants currently. LNMP was 2 years ago and pt reports that she is currently on the Depo injection for birth control.  Past Medical History  Diagnosis Date  . Sickle cell trait   . Hypertension     does not take meds  . Blood dyscrasia     sickle cell trait  . Hypercholesterolemia    Past Surgical History  Procedure Laterality Date  . Appendectomy    . Incision and drainage  perirectal abscess  12/21/09  . Cesarean section      x 2  . Proctoscopy  10/17/2010    Procedure: PROCTOSCOPY;  Surgeon: Scherry Ran;  Location: [facility];  Service: General;  Laterality: N/A;  Rigid Proctoscopy/Possible Fistula in Ano  Procedure ended at 1003  . Therapeutic abortion      x2  . Carpal tunnel release  03/23/2011    Procedure: CARPAL TUNNEL RELEASE;  Surgeon: Sanjuana Kava;  Location: [facility];  Service: Orthopedics;  Laterality: Left;  . Carpal tunnel release  05/10/2011    Procedure: CARPAL TUNNEL RELEASE;  Surgeon: Sanjuana Kava, MD;  Location: [facility];  Service: Orthopedics;  Laterality: Right;   Family History  Problem Relation Age of Onset  . Kidney failure Daughter   . Sickle cell anemia Daughter   . Sickle cell anemia Son   . Anesthesia problems Neg Hx   . Hypotension Neg Hx   . Malignant hyperthermia Neg Hx   . Pseudochol deficiency Neg Hx    History  Substance Use Topics  . Smoking status: Current Every Day Smoker -- 0.50 packs/day for 30 years    Types: Cigarettes  . Smokeless tobacco: Not on file  . Alcohol Use: No     Comment: one year and three months   OB History   Grav Para Term Preterm Abortions TAB SAB Ect Mult Living   4 2 2   2  Review of Systems  Gastrointestinal: Positive for nausea, abdominal pain, hematochezia and anal bleeding. Negative for vomiting, diarrhea, rectal pain and hematemesis.  Genitourinary: Negative for vaginal bleeding.  All other systems reviewed and are negative.   Allergies  Penicillins; Penicillins cross reactors; and Tape  Home Medications   Current Outpatient Rx  Name  Route  Sig  Dispense  Refill  . hydrocortisone (ANUSOL-HC) 25 MG suppository   Rectal   Place 1 suppository (25 mg total) rectally 2 (two) times daily. For 7 days   14 suppository   0   . ibuprofen (ADVIL,MOTRIN) 200 MG tablet   Oral   Take 800 mg by mouth every 6 (six) hours as needed for moderate pain.         Marland Kitchen lisinopril  (PRINIVIL,ZESTRIL) 10 MG tablet   Oral   Take 10 mg by mouth daily.         . Multiple Vitamin (MULTIVITAMIN WITH MINERALS) TABS   Oral   Take 1 tablet by mouth daily.          Triage Vitals: BP 149/78  Pulse 88  Temp(Src) 98.8 F (37.1 C) (Oral)  Resp 20  Ht 5\' 4"  (1.626 m)  Wt 172 lb (78.019 kg)  BMI 29.51 kg/m2  SpO2 100%  LMP 08/12/2010  Physical Exam  Nursing note and vitals reviewed.  CONSTITUTIONAL: Well developed/well nourished HEAD: Normocephalic/atraumatic EYES: EOMI/PERRL, conjunctivae pink ENMT: Mucous membranes moist NECK: supple no meningeal signs SPINE:entire spine nontender CV: S1/S2 noted, no murmurs/rubs/gallops noted LUNGS: Lungs are clear to auscultation bilaterally, no apparent distress ABDOMEN: soft, nontender, no rebound or guarding GU:no cva tenderness Rectal exam deferred as just had one yesterday in the ED NEURO: Pt is awake/alert, moves all extremitiesx4 EXTREMITIES: pulses normal, full ROM SKIN: warm, color normal PSYCH: no abnormalities of mood noted  ED Course  Procedures   DIAGNOSTIC STUDIES: Oxygen Saturation is 100% on RA, normal by my interpretation.    COORDINATION OF CARE: 6:10 PM-Discussed treatment plan which includes UA with pt at bedside and pt agreed to plan.   6:44 PM Labs reassuring No drop in HGB I doubt acute GI bleed at this time She needs to continue outpatient treatment and f/u Her abdominal exam is benign, no further testing needed  Labs Review Labs Reviewed  URINALYSIS, ROUTINE W REFLEX MICROSCOPIC - Abnormal; Notable for the following:    Hgb urine dipstick LARGE (*)    All other components within normal limits  I-STAT CHEM 8, ED - Abnormal; Notable for the following:    Potassium 3.4 (*)    Glucose, Bld 109 (*)    Calcium, Ion 1.26 (*)    Hemoglobin 11.6 (*)    HCT 34.0 (*)    All other components within normal limits  URINE MICROSCOPIC-ADD ON  POC URINE PREG, ED     MDM   Final  diagnoses:  Abdominal pain  Rectal bleeding    Nursing notes including past medical history and social history reviewed and considered in documentation Labs/vital reviewed and considered Previous records reviewed and considered    I personally performed the services described in this documentation, which was scribed in my presence. The recorded information has been reviewed and is accurate.       Sharyon Cable, MD 07/09/13 [phone]

## 2013-07-09 NOTE — ED Notes (Signed)
Pt reports was seen here yesterday for rectal bleeding.  Reports was told blood was coming from hemorrhoids.  Today c/o abd pain.  Reports nausea, no vomiting or diarrhea.  LBM was today after taking a laxative.

## 2013-07-09 NOTE — Discharge Instructions (Signed)

## 2013-08-13 ENCOUNTER — Emergency Department (HOSPITAL_COMMUNITY)
Admission: EM | Admit: 2013-08-13 | Discharge: 2013-08-13 | Disposition: A | Discharge status: Home / Self Care | Payer: Self-pay | Attending: Emergency Medicine | Admitting: Emergency Medicine

## 2013-08-13 ENCOUNTER — Encounter (HOSPITAL_COMMUNITY): Payer: Self-pay | Admitting: Emergency Medicine

## 2013-08-13 DIAGNOSIS — Z79899 Other long term (current) drug therapy: Secondary | ICD-10-CM | POA: Insufficient documentation

## 2013-08-13 DIAGNOSIS — Z7982 Long term (current) use of aspirin: Secondary | ICD-10-CM | POA: Insufficient documentation

## 2013-08-13 DIAGNOSIS — K610 Anal abscess: Secondary | ICD-10-CM

## 2013-08-13 DIAGNOSIS — Z9089 Acquired absence of other organs: Secondary | ICD-10-CM | POA: Insufficient documentation

## 2013-08-13 DIAGNOSIS — R112 Nausea with vomiting, unspecified: Secondary | ICD-10-CM | POA: Insufficient documentation

## 2013-08-13 DIAGNOSIS — I10 Essential (primary) hypertension: Secondary | ICD-10-CM | POA: Insufficient documentation

## 2013-08-13 DIAGNOSIS — Z862 Personal history of diseases of the blood and blood-forming organs and certain disorders involving the immune mechanism: Secondary | ICD-10-CM | POA: Insufficient documentation

## 2013-08-13 DIAGNOSIS — Z880 Allergy status to penicillin: Secondary | ICD-10-CM | POA: Insufficient documentation

## 2013-08-13 DIAGNOSIS — Z9889 Other specified postprocedural states: Secondary | ICD-10-CM | POA: Insufficient documentation

## 2013-08-13 DIAGNOSIS — L659 Nonscarring hair loss, unspecified: Secondary | ICD-10-CM | POA: Insufficient documentation

## 2013-08-13 DIAGNOSIS — F17200 Nicotine dependence, unspecified, uncomplicated: Secondary | ICD-10-CM | POA: Insufficient documentation

## 2013-08-13 DIAGNOSIS — E7800 Pure hypercholesterolemia, unspecified: Secondary | ICD-10-CM | POA: Insufficient documentation

## 2013-08-13 DIAGNOSIS — IMO0002 Reserved for concepts with insufficient information to code with codable children: Secondary | ICD-10-CM | POA: Insufficient documentation

## 2013-08-13 DIAGNOSIS — K612 Anorectal abscess: Secondary | ICD-10-CM | POA: Insufficient documentation

## 2013-08-13 MED ORDER — SULFAMETHOXAZOLE-TRIMETHOPRIM 800-160 MG PO TABS: 1.0000 | ORAL_TABLET | Freq: Two times a day (BID) | ORAL | Status: AC

## 2013-08-13 NOTE — Discharge Instructions (Signed)
Bactrim as prescribed. Followup with Dr. Romona Curls if not improving in the next several days.  Stop your simvastatin and discuss alternate medications with your Dr.   Abscess An abscess is an infected area that contains a collection of pus and debris.It can occur in almost any part of the body. An abscess is also known as a furuncle or boil. CAUSES  An abscess occurs when tissue gets infected. This can occur from blockage of oil or sweat glands, infection of hair follicles, or a minor injury to the skin. As the body tries to fight the infection, pus collects in the area and creates pressure under the skin. This pressure causes pain. People with weakened immune systems have difficulty fighting infections and get certain abscesses more often.  SYMPTOMS Usually an abscess develops on the skin and becomes a painful mass that is red, warm, and tender. If the abscess forms under the skin, you may feel a moveable soft area under the skin. Some abscesses break open (rupture) on their own, but most will continue to get worse without care. The infection can spread deeper into the body and eventually into the bloodstream, causing you to feel ill.  DIAGNOSIS  Your caregiver will take your medical history and perform a physical exam. A sample of fluid may also be taken from the abscess to determine what is causing your infection. TREATMENT  Your caregiver may prescribe antibiotic medicines to fight the infection. However, taking antibiotics alone usually does not cure an abscess. Your caregiver may need to make a small cut (incision) in the abscess to drain the pus. In some cases, gauze is packed into the abscess to reduce pain and to continue draining the area. HOME CARE INSTRUCTIONS   Only take over-the-counter or prescription medicines for pain, discomfort, or fever as directed by your caregiver.  If you were prescribed antibiotics, take them as directed. Finish them even if you start to feel better.  If  gauze is used, follow your caregiver's directions for changing the gauze.  To avoid spreading the infection:  Keep your draining abscess covered with a bandage.  Wash your hands well.  Do not share personal care items, towels, or whirlpools with others.  Avoid skin contact with others.  Keep your skin and clothes clean around the abscess.  Keep all follow-up appointments as directed by your caregiver. SEEK MEDICAL CARE IF:   You have increased pain, swelling, redness, fluid drainage, or bleeding.  You have muscle aches, chills, or a general ill feeling.  You have a fever. MAKE SURE YOU:   Understand these instructions.  Will watch your condition.  Will get help right away if you are not doing well or get worse. Document Released: 12/27/2004 Document Revised: 09/18/2011 Document Reviewed: 06/01/2011 [facility] Patient Information 2014 [place].

## 2013-08-13 NOTE — ED Notes (Signed)
reveiwed discharge instructions and script with pt. Verbalized understanding. Stated going to see MD re - simvistatin after leaving ED.

## 2013-08-13 NOTE — ED Provider Notes (Addendum)
CSN: [id number]     Arrival date & time 08/13/13  2725 History  This chart was scribed for Veryl Speak, MD,  by Stacy Gardner, ED Scribe. The patient was seen in room APA01/APA01 and the patient's care was started at 7:57 AM.    First MD Initiated Contact with Patient 08/13/13 0753     Chief Complaint  Patient presents with  . Abscess     (Consider location/radiation/quality/duration/timing/severity/associated sxs/prior Treatment) HPI HPI Comments: Amber Drake is a 53 y.o. female who presents to the Emergency Department complaining of abscess. She speculates her symptoms are associated with taking the new cholesterol medication prescribed three months ago. She has associated symptoms of hair loss, nausea, mild abdominal pain, and recurrent boils to her medial buttock near her genitals. Pt states the last time she had the abscess she tried antibiotics prior to having an I&D. Pt feels stressed about taking care of her children with sickle cell.  She has a past medical hx hypercholesterolemia, blood dyscrasia, HTN and sickle cell trait.   Past Medical History  Diagnosis Date  . Sickle cell trait   . Hypertension     does not take meds  . Blood dyscrasia     sickle cell trait  . Hypercholesterolemia    Past Surgical History  Procedure Laterality Date  . Appendectomy    . Incision and drainage perirectal abscess  12/21/09  . Cesarean section      x 2  . Proctoscopy  10/17/2010    Procedure: PROCTOSCOPY;  Surgeon: Scherry Ran;  Location: [facility];  Service: General;  Laterality: N/A;  Rigid Proctoscopy/Possible Fistula in Ano  Procedure ended at 1003  . Therapeutic abortion      x2  . Carpal tunnel release  03/23/2011    Procedure: CARPAL TUNNEL RELEASE;  Surgeon: Sanjuana Kava;  Location: [facility];  Service: Orthopedics;  Laterality: Left;  . Carpal tunnel release  05/10/2011    Procedure: CARPAL TUNNEL RELEASE;  Surgeon: Sanjuana Kava, MD;  Location: [facility];  Service:  Orthopedics;  Laterality: Right;   Family History  Problem Relation Age of Onset  . Kidney failure Daughter   . Sickle cell anemia Daughter   . Sickle cell anemia Son   . Anesthesia problems Neg Hx   . Hypotension Neg Hx   . Malignant hyperthermia Neg Hx   . Pseudochol deficiency Neg Hx    History  Substance Use Topics  . Smoking status: Current Every Day Smoker -- 0.50 packs/day for 30 years    Types: Cigarettes  . Smokeless tobacco: Not on file  . Alcohol Use: No     Comment: one year and three months   OB History   Grav Para Term Preterm Abortions TAB SAB Ect Mult Living   4 2 2  2           Review of Systems  HENT:       Hair loss  Gastrointestinal: Positive for nausea and vomiting.  Skin:       abscess to lower buttocks  All other systems reviewed and are negative.     Allergies  Penicillins; Penicillins cross reactors; and Tape  Home Medications   Prior to Admission medications   Medication Sig Start Date End Date Taking? Authorizing Provider  hydrocortisone (ANUSOL-HC) 25 MG suppository Place 1 suppository (25 mg total) rectally 2 (two) times daily. For 7 days 07/08/13   Veryl Speak, MD  ibuprofen (ADVIL,MOTRIN) 200 MG tablet Take 800 mg by  mouth every 6 (six) hours as needed for moderate pain.    Historical Provider, MD  lisinopril (PRINIVIL,ZESTRIL) 10 MG tablet Take 10 mg by mouth daily.    Historical Provider, MD  Multiple Vitamin (MULTIVITAMIN WITH MINERALS) TABS Take 1 tablet by mouth daily.    Historical Provider, MD   BP 166/97  Pulse 88  Temp(Src) 98.2 F (36.8 C) (Oral)  Resp 15  Ht 5\' 9"  (1.753 m)  Wt 173 lb (78.472 kg)  BMI 25.54 kg/m2  SpO2 100%  LMP 08/12/2010 Physical Exam  Nursing note and vitals reviewed. Constitutional: She is oriented to person, place, and time. She appears well-developed and well-nourished. No distress.  HENT:  Head: Normocephalic and atraumatic.  Mouth/Throat: Oropharynx is clear and moist.  Neck: Normal range  of motion. Neck supple.  Cardiovascular: Normal rate, regular rhythm and normal heart sounds.   No murmur heard. Pulmonary/Chest: Effort normal and breath sounds normal. No respiratory distress. She has no wheezes.  Abdominal: Soft. Bowel sounds are normal. She exhibits no distension. There is no tenderness.  Genitourinary:  There are multiple, small, tender areas to the inside of the left buttock in the perianal region. I feel no fluctuance.  Musculoskeletal: Normal range of motion. She exhibits no edema.  Lymphadenopathy:    She has no cervical adenopathy.  Neurological: She is alert and oriented to person, place, and time.  Skin: Skin is warm and dry. She is not diaphoretic.    ED Course  Procedures (including critical care time) DIAGNOSTIC STUDIES: Oxygen Saturation is 100% on room air, normal by my interpretation.    COORDINATION OF CARE:  8:00 AM Discussed course of care with pt . Pt understands and agrees.   Labs Review Labs Reviewed - No data to display  Imaging Review No results found.   EKG Interpretation None      MDM   Final diagnoses:  None    Patient is a 53 year old female who presents with complaints of recurrent boils in the perirectal area. When these occur, she generally gets antibiotics and ago away. In the past she has had surgery by Dr. Romona Curls. I will treat these with antibiotics and she can followup as needed.  She also complains of her hair falling out and believes this is due to her cholesterol filled. She is currently taking simvastatin, and from what I can tell on the PDR, this may well be a side effect of this. She would like to stop taking this medication and discuss alternate medications with her Dr. due to this potential side effect.   I personally performed the services described in this documentation, which was scribed in my presence. The recorded information has been reviewed and is accurate.      Veryl Speak, MD 08/13/13  [id number]  Veryl Speak, MD 08/13/13 [phone]

## 2013-08-13 NOTE — ED Notes (Signed)
Md in rooom

## 2013-08-13 NOTE — ED Notes (Signed)
Pt states she is falling apat. complai of boils, hair falling out and slight abdominal pain

## 2014-02-01 ENCOUNTER — Encounter (HOSPITAL_COMMUNITY): Payer: Self-pay | Admitting: Emergency Medicine

## 2014-02-28 ENCOUNTER — Emergency Department (HOSPITAL_COMMUNITY)
Admission: EM | Admit: 2014-02-28 | Discharge: 2014-02-28 | Discharge status: Against Medical Advice | Payer: Self-pay | Attending: Emergency Medicine | Admitting: Emergency Medicine

## 2014-02-28 ENCOUNTER — Encounter (HOSPITAL_COMMUNITY): Payer: Self-pay | Admitting: Cardiology

## 2014-02-28 DIAGNOSIS — Z720 Tobacco use: Secondary | ICD-10-CM | POA: Insufficient documentation

## 2014-02-28 DIAGNOSIS — E780 Pure hypercholesterolemia: Secondary | ICD-10-CM | POA: Insufficient documentation

## 2014-02-28 DIAGNOSIS — I10 Essential (primary) hypertension: Secondary | ICD-10-CM | POA: Insufficient documentation

## 2014-02-28 DIAGNOSIS — M79604 Pain in right leg: Secondary | ICD-10-CM | POA: Insufficient documentation

## 2014-02-28 NOTE — ED Notes (Signed)
Right leg pain times 2 weeks.  Denies any injury.

## 2014-02-28 NOTE — ED Notes (Signed)
Pt states that she "cant wait any longer to see a doctor", stated she was "going to go home to get some stuff done and will prob come back in the morning". Patient signed out AMA at this time.

## 2014-03-02 ENCOUNTER — Emergency Department (HOSPITAL_COMMUNITY)
Admission: EM | Admit: 2014-03-02 | Discharge: 2014-03-02 | Disposition: A | Discharge status: Home / Self Care | Payer: Self-pay | Attending: Emergency Medicine | Admitting: Emergency Medicine

## 2014-03-02 ENCOUNTER — Encounter (HOSPITAL_COMMUNITY): Payer: Self-pay | Admitting: Emergency Medicine

## 2014-03-02 DIAGNOSIS — Z720 Tobacco use: Secondary | ICD-10-CM | POA: Insufficient documentation

## 2014-03-02 DIAGNOSIS — Z792 Long term (current) use of antibiotics: Secondary | ICD-10-CM | POA: Insufficient documentation

## 2014-03-02 DIAGNOSIS — I10 Essential (primary) hypertension: Secondary | ICD-10-CM | POA: Insufficient documentation

## 2014-03-02 DIAGNOSIS — Z862 Personal history of diseases of the blood and blood-forming organs and certain disorders involving the immune mechanism: Secondary | ICD-10-CM | POA: Insufficient documentation

## 2014-03-02 DIAGNOSIS — Z880 Allergy status to penicillin: Secondary | ICD-10-CM | POA: Insufficient documentation

## 2014-03-02 DIAGNOSIS — E780 Pure hypercholesterolemia: Secondary | ICD-10-CM | POA: Insufficient documentation

## 2014-03-02 DIAGNOSIS — L02215 Cutaneous abscess of perineum: Secondary | ICD-10-CM | POA: Insufficient documentation

## 2014-03-02 DIAGNOSIS — Z79899 Other long term (current) drug therapy: Secondary | ICD-10-CM | POA: Insufficient documentation

## 2014-03-02 DIAGNOSIS — L0291 Cutaneous abscess, unspecified: Secondary | ICD-10-CM

## 2014-03-02 MED ORDER — CLINDAMYCIN HCL 300 MG PO CAPS: 300.0000 mg | ORAL_CAPSULE | Freq: Four times a day (QID) | ORAL | Status: DC

## 2014-03-02 MED ORDER — HYDROCODONE-ACETAMINOPHEN 5-325 MG PO TABS: 1.0000 | ORAL_TABLET | Freq: Four times a day (QID) | ORAL | Status: DC | PRN

## 2014-03-02 NOTE — ED Notes (Signed)
Pt reports "boil" between buttocks, and LLE pain x2 weeks. Pt denies any known injury.

## 2014-03-02 NOTE — ED Provider Notes (Signed)
CSN: [id number]     Arrival date & time 03/02/14  0750 History   First MD Initiated Contact with Patient 03/02/14 0805     Chief Complaint  Patient presents with  . Abscess     (Consider location/radiation/quality/duration/timing/severity/associated sxs/prior Treatment) HPI Comments: Pt comes in today with complaint of recurring abscess over the last 2 weeks. Pt states that she has had this multiple times in the same area. Denies any drainage or fever. She states that she is hurting in her groin as well. Hasn't taken anything for the symptoms.  The history is provided by the patient. No language interpreter was used.    Past Medical History  Diagnosis Date  . Sickle cell trait   . Hypertension     does not take meds  . Blood dyscrasia     sickle cell trait  . Hypercholesterolemia    Past Surgical History  Procedure Laterality Date  . Appendectomy    . Incision and drainage perirectal abscess  12/21/09  . Cesarean section      x 2  . Proctoscopy  10/17/2010    Procedure: PROCTOSCOPY;  Surgeon: Scherry Ran;  Location: [facility];  Service: General;  Laterality: N/A;  Rigid Proctoscopy/Possible Fistula in Ano  Procedure ended at 1003  . Therapeutic abortion      x2  . Carpal tunnel release  03/23/2011    Procedure: CARPAL TUNNEL RELEASE;  Surgeon: Sanjuana Kava;  Location: [facility];  Service: Orthopedics;  Laterality: Left;  . Carpal tunnel release  05/10/2011    Procedure: CARPAL TUNNEL RELEASE;  Surgeon: Sanjuana Kava, MD;  Location: [facility];  Service: Orthopedics;  Laterality: Right;   Family History  Problem Relation Age of Onset  . Kidney failure Daughter   . Sickle cell anemia Daughter   . Sickle cell anemia Son   . Anesthesia problems Neg Hx   . Hypotension Neg Hx   . Malignant hyperthermia Neg Hx   . Pseudochol deficiency Neg Hx    History  Substance Use Topics  . Smoking status: Current Every Day Smoker -- 0.50 packs/day for 30 years    Types: Cigarettes  .  Smokeless tobacco: Not on file  . Alcohol Use: No     Comment: one year and three months   OB History    Gravida Para Term Preterm AB TAB SAB Ectopic Multiple Living   4 2 2  2           Review of Systems  All other systems reviewed and are negative.     Allergies  Penicillins; Penicillins cross reactors; and Tape  Home Medications   Prior to Admission medications   Medication Sig Start Date End Date Taking? Authorizing Provider  clindamycin (CLEOCIN) 300 MG capsule Take 1 capsule (300 mg total) by mouth every 6 (six) hours. 03/02/14   Glendell Docker, NP  HYDROcodone-acetaminophen (NORCO/VICODIN) 5-325 MG per tablet Take 1-2 tablets by mouth every 6 (six) hours as needed. 03/02/14   Glendell Docker, NP  hydrocortisone (ANUSOL-HC) 25 MG suppository Place 1 suppository (25 mg total) rectally 2 (two) times daily. For 7 days Patient not taking: Reported on 02/28/2014 07/08/13   Veryl Speak, MD  ibuprofen (ADVIL,MOTRIN) 200 MG tablet Take 800 mg by mouth every 6 (six) hours as needed for moderate pain.    Historical Provider, MD  lisinopril (PRINIVIL,ZESTRIL) 10 MG tablet Take 10 mg by mouth daily.    Historical Provider, MD  Multiple Vitamin (MULTIVITAMIN WITH MINERALS) TABS  Take 1 tablet by mouth daily.    Historical Provider, MD  ranitidine (ZANTAC) 150 MG tablet Take 150 mg by mouth 2 (two) times daily.    Historical Provider, MD  simvastatin (ZOCOR) 10 MG tablet Take 10 mg by mouth daily.    Historical Provider, MD   BP 136/73 mmHg  Pulse 77  Temp(Src) 98.4 F (36.9 C) (Oral)  Resp 18  Ht 5\' 8"  (1.727 m)  Wt 174 lb (78.926 kg)  BMI 26.46 kg/m2  SpO2 100%  LMP 08/12/2010 Physical Exam  Constitutional: She is oriented to person, place, and time. She appears well-developed and well-nourished.  Cardiovascular: Normal rate and regular rhythm.   Pulmonary/Chest: Effort normal and breath sounds normal.  Musculoskeletal: Normal range of motion.  Neurological: She is alert and  oriented to person, place, and time.  Skin:  2 areas or tenderness noted over old scar tissue to the right perineal area. No fluctuance of firmness noted. Mild lymphadenopathy noted in the groin area  Nursing note and vitals reviewed.   ED Course  Procedures (including critical care time) Labs Review Labs Reviewed - No data to display  Imaging Review No results found.   EKG Interpretation None      MDM   Final diagnoses:  Abscess    Will treat with antibiotics and pain medication. Nothing to be drained at this time.pt had bactrim about 6 months ago for similar symptoms. Will due clinda    Glendell Docker, NP 03/02/14 [place], MD 03/02/14 [phone]

## 2014-03-02 NOTE — ED Notes (Signed)
PA at bedside.

## 2014-03-02 NOTE — Discharge Instructions (Signed)

## 2014-03-04 ENCOUNTER — Other Ambulatory Visit (HOSPITAL_COMMUNITY): Payer: Self-pay | Admitting: Physician Assistant

## 2014-03-04 DIAGNOSIS — Z1231 Encounter for screening mammogram for malignant neoplasm of breast: Secondary | ICD-10-CM

## 2014-03-23 ENCOUNTER — Encounter (HOSPITAL_COMMUNITY): Payer: Self-pay | Admitting: Emergency Medicine

## 2014-03-23 ENCOUNTER — Emergency Department (HOSPITAL_COMMUNITY)
Admission: EM | Admit: 2014-03-23 | Discharge: 2014-03-23 | Disposition: A | Discharge status: Home / Self Care | Payer: Self-pay | Attending: Emergency Medicine | Admitting: Emergency Medicine

## 2014-03-23 DIAGNOSIS — D573 Sickle-cell trait: Secondary | ICD-10-CM | POA: Insufficient documentation

## 2014-03-23 DIAGNOSIS — Z79899 Other long term (current) drug therapy: Secondary | ICD-10-CM | POA: Insufficient documentation

## 2014-03-23 DIAGNOSIS — E780 Pure hypercholesterolemia: Secondary | ICD-10-CM | POA: Insufficient documentation

## 2014-03-23 DIAGNOSIS — Z880 Allergy status to penicillin: Secondary | ICD-10-CM | POA: Insufficient documentation

## 2014-03-23 DIAGNOSIS — M5431 Sciatica, right side: Secondary | ICD-10-CM | POA: Insufficient documentation

## 2014-03-23 DIAGNOSIS — Z720 Tobacco use: Secondary | ICD-10-CM | POA: Insufficient documentation

## 2014-03-23 DIAGNOSIS — M62830 Muscle spasm of back: Secondary | ICD-10-CM | POA: Insufficient documentation

## 2014-03-23 DIAGNOSIS — I10 Essential (primary) hypertension: Secondary | ICD-10-CM | POA: Insufficient documentation

## 2014-03-23 MED ORDER — NAPROXEN 500 MG PO TABS: 500.0000 mg | ORAL_TABLET | Freq: Two times a day (BID) | ORAL | Status: DC

## 2014-03-23 MED ORDER — CYCLOBENZAPRINE HCL 10 MG PO TABS: 10.0000 mg | ORAL_TABLET | Freq: Two times a day (BID) | ORAL | Status: DC | PRN

## 2014-03-23 NOTE — ED Notes (Signed)
Pt reports leg and back pain x 1 month.

## 2014-03-23 NOTE — ED Provider Notes (Signed)
CSN: [id number]     Arrival date & time 03/23/14  1740 History   First MD Initiated Contact with Patient 03/23/14 1843     Chief Complaint  Patient presents with  . Back Pain     (Consider location/radiation/quality/duration/timing/severity/associated sxs/prior Treatment) Patient is a 53 y.o. female presenting with back pain. The history is provided by the patient.  Back Pain Location:  Lumbar spine Quality:  Shooting Radiates to:  R knee, R thigh and R posterior upper leg Pain severity:  Moderate Pain is:  Worse during the night Onset quality:  Gradual Duration:  1 month Timing:  Constant Progression:  Worsening Chronicity:  New Relieved by:  Nothing Worsened by:  Movement and ambulation Ineffective treatments:  None tried Associated symptoms: no bladder incontinence and no bowel incontinence    Amber Drake is a 53 y.o. female who presents to the ED with right lower back pain that started about a month ago. She has taken nothing for pain. She states that the pain is worse at night when she is trying to sleep. She describes the pain as shooting down the right side of her buttocks and into her leg and around to the groin. She denies UTI symptoms.   Past Medical History  Diagnosis Date  . Sickle cell trait   . Hypertension     does not take meds  . Blood dyscrasia     sickle cell trait  . Hypercholesterolemia    Past Surgical History  Procedure Laterality Date  . Appendectomy    . Incision and drainage perirectal abscess  12/21/09  . Cesarean section      x 2  . Proctoscopy  10/17/2010    Procedure: PROCTOSCOPY;  Surgeon: Scherry Ran;  Location: [facility];  Service: General;  Laterality: N/A;  Rigid Proctoscopy/Possible Fistula in Ano  Procedure ended at 1003  . Therapeutic abortion      x2  . Carpal tunnel release  03/23/2011    Procedure: CARPAL TUNNEL RELEASE;  Surgeon: Sanjuana Kava;  Location: [facility];  Service: Orthopedics;  Laterality: Left;  . Carpal  tunnel release  05/10/2011    Procedure: CARPAL TUNNEL RELEASE;  Surgeon: Sanjuana Kava, MD;  Location: [facility];  Service: Orthopedics;  Laterality: Right;   Family History  Problem Relation Age of Onset  . Kidney failure Daughter   . Sickle cell anemia Daughter   . Sickle cell anemia Son   . Anesthesia problems Neg Hx   . Hypotension Neg Hx   . Malignant hyperthermia Neg Hx   . Pseudochol deficiency Neg Hx    History  Substance Use Topics  . Smoking status: Current Every Day Smoker -- 0.50 packs/day for 30 years    Types: Cigarettes  . Smokeless tobacco: Not on file  . Alcohol Use: No     Comment: one year and three months   OB History    Gravida Para Term Preterm AB TAB SAB Ectopic Multiple Living   4 2 2  2           Review of Systems  Gastrointestinal: Negative for bowel incontinence.  Genitourinary: Negative for bladder incontinence.  Musculoskeletal: Positive for back pain.   All other systems negative.   Allergies  Penicillins; Penicillins cross reactors; and Tape  Home Medications   Prior to Admission medications   Medication Sig Start Date End Date Taking? Authorizing Provider  clindamycin (CLEOCIN) 300 MG capsule Take 1 capsule (300 mg total) by mouth every 6 (six)  hours. 03/02/14   Glendell Docker, NP  cyclobenzaprine (FLEXERIL) 10 MG tablet Take 1 tablet (10 mg total) by mouth 2 (two) times daily as needed for muscle spasms. 03/23/14   Hope Bunnie Pion, NP  HYDROcodone-acetaminophen (NORCO/VICODIN) 5-325 MG per tablet Take 1-2 tablets by mouth every 6 (six) hours as needed. 03/02/14   Glendell Docker, NP  hydrocortisone (ANUSOL-HC) 25 MG suppository Place 1 suppository (25 mg total) rectally 2 (two) times daily. For 7 days Patient not taking: Reported on 02/28/2014 07/08/13   Veryl Speak, MD  lisinopril (PRINIVIL,ZESTRIL) 10 MG tablet Take 10 mg by mouth daily.    Historical Provider, MD  Multiple Vitamin (MULTIVITAMIN WITH MINERALS) TABS Take 1 tablet by mouth  daily.    Historical Provider, MD  naproxen (NAPROSYN) 500 MG tablet Take 1 tablet (500 mg total) by mouth 2 (two) times daily. 03/23/14   Hope Bunnie Pion, NP  ranitidine (ZANTAC) 150 MG tablet Take 150 mg by mouth 2 (two) times daily.    Historical Provider, MD  simvastatin (ZOCOR) 10 MG tablet Take 10 mg by mouth daily.    Historical Provider, MD   BP 149/78 mmHg  Pulse 79  Temp(Src) 98.8 F (37.1 C) (Oral)  Resp 18  Ht 5\' 4"  (1.626 m)  Wt 172 lb (78.019 kg)  BMI 29.51 kg/m2  SpO2 100%  LMP 08/12/2010 Physical Exam  Constitutional: She is oriented to person, place, and time. She appears well-developed and well-nourished. No distress.  HENT:  Head: Normocephalic and atraumatic.  Right Ear: Tympanic membrane normal.  Left Ear: Tympanic membrane normal.  Nose: Nose normal.  Mouth/Throat: Uvula is midline, oropharynx is clear and moist and mucous membranes are normal.  Eyes: EOM are normal.  Neck: Normal range of motion. Neck supple.  Cardiovascular: Normal rate and regular rhythm.   Pulmonary/Chest: Effort normal and breath sounds normal. She has no wheezes. She has no rales.  Abdominal: Soft. Bowel sounds are normal. There is no tenderness.  Musculoskeletal: Normal range of motion.       Lumbar back: She exhibits tenderness, pain and spasm. She exhibits normal pulse.       Back:  Neurological: She is alert and oriented to person, place, and time. She has normal strength. No cranial nerve deficit or sensory deficit. Gait normal.  Reflex Scores:      Bicep reflexes are 2+ on the right side and 2+ on the left side.      Brachioradialis reflexes are 2+ on the right side and 2+ on the left side.      Patellar reflexes are 2+ on the right side and 2+ on the left side.      Achilles reflexes are 2+ on the right side and 2+ on the left side. Skin: Skin is warm and dry.  Psychiatric: She has a normal mood and affect. Her behavior is normal.  Nursing note and vitals reviewed.   ED  Course  Procedures (including critical care time) Labs Review  MDM  53 y.o. female with low back pain that radiates to the right hip x 1 month. Stable for discharge without neuro deficits. Will treat for pain and muscle spasm. Discussed with patient and she voices understanding and agrees with plaln.    Medication List    STOP taking these medications        ibuprofen 200 MG tablet  Commonly known as:  ADVIL,MOTRIN      TAKE these medications  cyclobenzaprine 10 MG tablet  Commonly known as:  FLEXERIL  Take 1 tablet (10 mg total) by mouth 2 (two) times daily as needed for muscle spasms.     naproxen 500 MG tablet  Commonly known as:  NAPROSYN  Take 1 tablet (500 mg total) by mouth 2 (two) times daily.      ASK your doctor about these medications        clindamycin 300 MG capsule  Commonly known as:  CLEOCIN  Take 1 capsule (300 mg total) by mouth every 6 (six) hours.     HYDROcodone-acetaminophen 5-325 MG per tablet  Commonly known as:  NORCO/VICODIN  Take 1-2 tablets by mouth every 6 (six) hours as needed.     hydrocortisone 25 MG suppository  Commonly known as:  ANUSOL-HC  Place 1 suppository (25 mg total) rectally 2 (two) times daily. For 7 days     lisinopril 10 MG tablet  Commonly known as:  PRINIVIL,ZESTRIL  Take 10 mg by mouth daily.     multivitamin with minerals Tabs tablet  Take 1 tablet by mouth daily.     ranitidine 150 MG tablet  Commonly known as:  ZANTAC  Take 150 mg by mouth 2 (two) times daily.     simvastatin 10 MG tablet  Commonly known as:  ZOCOR  Take 10 mg by mouth daily.        Final diagnoses:  Sciatica, right        Ashley Murrain, NP 03/25/14 Fitzgerald, MD 03/27/14 [phone]

## 2014-03-31 ENCOUNTER — Ambulatory Visit (HOSPITAL_COMMUNITY): Payer: Self-pay

## 2014-04-07 ENCOUNTER — Encounter (HOSPITAL_COMMUNITY): Payer: Self-pay

## 2014-04-25 ENCOUNTER — Encounter (HOSPITAL_COMMUNITY): Payer: Self-pay | Admitting: Emergency Medicine

## 2014-04-25 ENCOUNTER — Emergency Department (HOSPITAL_COMMUNITY)
Admission: EM | Admit: 2014-04-25 | Discharge: 2014-04-25 | Disposition: A | Discharge status: Home / Self Care | Payer: Self-pay | Attending: Emergency Medicine | Admitting: Emergency Medicine

## 2014-04-25 DIAGNOSIS — L0231 Cutaneous abscess of buttock: Secondary | ICD-10-CM | POA: Insufficient documentation

## 2014-04-25 DIAGNOSIS — I10 Essential (primary) hypertension: Secondary | ICD-10-CM | POA: Insufficient documentation

## 2014-04-25 DIAGNOSIS — Z7952 Long term (current) use of systemic steroids: Secondary | ICD-10-CM | POA: Insufficient documentation

## 2014-04-25 DIAGNOSIS — Z862 Personal history of diseases of the blood and blood-forming organs and certain disorders involving the immune mechanism: Secondary | ICD-10-CM | POA: Insufficient documentation

## 2014-04-25 DIAGNOSIS — Z720 Tobacco use: Secondary | ICD-10-CM | POA: Insufficient documentation

## 2014-04-25 DIAGNOSIS — Z8639 Personal history of other endocrine, nutritional and metabolic disease: Secondary | ICD-10-CM | POA: Insufficient documentation

## 2014-04-25 DIAGNOSIS — Z792 Long term (current) use of antibiotics: Secondary | ICD-10-CM | POA: Insufficient documentation

## 2014-04-25 DIAGNOSIS — M79604 Pain in right leg: Secondary | ICD-10-CM | POA: Insufficient documentation

## 2014-04-25 DIAGNOSIS — Z791 Long term (current) use of non-steroidal anti-inflammatories (NSAID): Secondary | ICD-10-CM | POA: Insufficient documentation

## 2014-04-25 DIAGNOSIS — Z880 Allergy status to penicillin: Secondary | ICD-10-CM | POA: Insufficient documentation

## 2014-04-25 MED ORDER — SULFAMETHOXAZOLE-TRIMETHOPRIM 800-160 MG PO TABS: 1.0000 | ORAL_TABLET | Freq: Two times a day (BID) | ORAL | Status: DC

## 2014-04-25 MED ORDER — NAPROXEN 500 MG PO TABS: 500.0000 mg | ORAL_TABLET | Freq: Two times a day (BID) | ORAL | Status: DC

## 2014-04-25 NOTE — ED Provider Notes (Signed)
CSN: [id number]     Arrival date & time 04/25/14  1520 History   First MD Initiated Contact with Patient 04/25/14 1620     Chief Complaint  Patient presents with  . Abscess  . Leg Pain     (Consider location/radiation/quality/duration/timing/severity/associated sxs/prior Treatment) HPI Comments: The patient is a 54 year old female, history of occasional abscesses in her perineal area who presents with a complaint of swelling of her right buttock as well as her left labia which she states has been going on for 2 days, mild, gradually worsening, not associated with fevers chills nausea vomiting or drainage. She has recently been seen for similar symptoms, she states that this is not as bad as it has been in the past. She denies systemic symptoms. She also complains of right lower extremity pain from her right inguinal area that shoots down her leg and gets worse at night. She denies swelling of the leg, denies trauma  Patient is a 54 y.o. female presenting with abscess and leg pain. The history is provided by the patient.  Abscess Associated symptoms: no fever, no nausea and no vomiting   Leg Pain Associated symptoms: no fever     Past Medical History  Diagnosis Date  . Sickle cell trait   . Hypertension     does not take meds  . Blood dyscrasia     sickle cell trait  . Hypercholesterolemia    Past Surgical History  Procedure Laterality Date  . Appendectomy    . Incision and drainage perirectal abscess  12/21/09  . Cesarean section      x 2  . Proctoscopy  10/17/2010    Procedure: PROCTOSCOPY;  Surgeon: Scherry Ran;  Location: [facility];  Service: General;  Laterality: N/A;  Rigid Proctoscopy/Possible Fistula in Ano  Procedure ended at 1003  . Therapeutic abortion      x2  . Carpal tunnel release  03/23/2011    Procedure: CARPAL TUNNEL RELEASE;  Surgeon: Sanjuana Kava;  Location: [facility];  Service: Orthopedics;  Laterality: Left;  . Carpal tunnel release  05/10/2011   Procedure: CARPAL TUNNEL RELEASE;  Surgeon: Sanjuana Kava, MD;  Location: [facility];  Service: Orthopedics;  Laterality: Right;   Family History  Problem Relation Age of Onset  . Kidney failure Daughter   . Sickle cell anemia Daughter   . Sickle cell anemia Son   . Anesthesia problems Neg Hx   . Hypotension Neg Hx   . Malignant hyperthermia Neg Hx   . Pseudochol deficiency Neg Hx    History  Substance Use Topics  . Smoking status: Current Every Day Smoker -- 0.50 packs/day for 30 years    Types: Cigarettes  . Smokeless tobacco: Not on file  . Alcohol Use: No     Comment: one year and three months   OB History    Gravida Para Term Preterm AB TAB SAB Ectopic Multiple Living   4 2 2  2     2      Review of Systems  Constitutional: Negative for fever and chills.  Gastrointestinal: Negative for nausea and vomiting.  Skin: Positive for rash.       abscess      Allergies  Penicillins; Penicillins cross reactors; and Tape  Home Medications   Prior to Admission medications   Medication Sig Start Date End Date Taking? Authorizing Provider  lisinopril (PRINIVIL,ZESTRIL) 10 MG tablet Take 10 mg by mouth daily.   Yes Historical Provider, MD  Multiple Vitamin (  MULTIVITAMIN WITH MINERALS) TABS Take 1 tablet by mouth daily.   Yes Historical Provider, MD  ranitidine (ZANTAC) 150 MG tablet Take 150 mg by mouth 2 (two) times daily.   Yes Historical Provider, MD  simvastatin (ZOCOR) 10 MG tablet Take 10 mg by mouth daily.   Yes Historical Provider, MD  clindamycin (CLEOCIN) 300 MG capsule Take 1 capsule (300 mg total) by mouth every 6 (six) hours. Patient not taking: Reported on 04/25/2014 03/02/14   Glendell Docker, NP  cyclobenzaprine (FLEXERIL) 10 MG tablet Take 1 tablet (10 mg total) by mouth 2 (two) times daily as needed for muscle spasms. Patient not taking: Reported on 04/25/2014 03/23/14   Ashley Murrain, NP  HYDROcodone-acetaminophen (NORCO/VICODIN) 5-325 MG per tablet Take 1-2 tablets  by mouth every 6 (six) hours as needed. Patient not taking: Reported on 04/25/2014 03/02/14   Glendell Docker, NP  hydrocortisone (ANUSOL-HC) 25 MG suppository Place 1 suppository (25 mg total) rectally 2 (two) times daily. For 7 days Patient not taking: Reported on 02/28/2014 07/08/13   Veryl Speak, MD  naproxen (NAPROSYN) 500 MG tablet Take 1 tablet (500 mg total) by mouth 2 (two) times daily. Patient not taking: Reported on 04/25/2014 03/23/14   Ashley Murrain, NP  naproxen (NAPROSYN) 500 MG tablet Take 1 tablet (500 mg total) by mouth 2 (two) times daily with a meal. 04/25/14   Johnna Acosta, MD  sulfamethoxazole-trimethoprim (SEPTRA DS) 800-160 MG per tablet Take 1 tablet by mouth every 12 (twelve) hours. 04/25/14   Johnna Acosta, MD   LMP 08/12/2010 Physical Exam  Constitutional: She appears well-developed and well-nourished. No distress.  HENT:  Head: Normocephalic and atraumatic.  Eyes: Conjunctivae are normal. Right eye exhibits no discharge. Left eye exhibits no discharge. No scleral icterus.  Cardiovascular: Normal rate and regular rhythm.   No murmur heard. Pulmonary/Chest: Effort normal and breath sounds normal.  Genitourinary:  Chaperone present for exam, no obvious abscess detected when labial folds are palpated, no masses, no redness. Right medial proximal thigh at the edge of the buttocks in the perineum with area of tenderness without discrete mass induration redness or fluctuance.  Musculoskeletal: She exhibits tenderness ( Tender to palpation over the right inguinal area, no masses, no femoral hernias, no swelling). She exhibits no edema.  Normal range of motion of the right lower extremity, no asymmetry or edema, joints are supple, all compartments are soft  Skin: Skin is warm and dry. She is not diaphoretic.  Nursing note and vitals reviewed.   ED Course  Procedures (including critical care time) Labs Review Labs Reviewed - No data to display  Imaging Review No  results found.   EKG Interpretation None      MDM   Final diagnoses:  Abscess of right buttock  Right leg pain    The patient has normal vital signs, she appears nonacute, she has no fever, appears well, will be placed on medications for pain and infection, no indication to incise and drain any wounds at this time.  Meds given in ED:  Medications - No data to display  New Prescriptions   NAPROXEN (NAPROSYN) 500 MG TABLET    Take 1 tablet (500 mg total) by mouth 2 (two) times daily with a meal.   SULFAMETHOXAZOLE-TRIMETHOPRIM (SEPTRA DS) 800-160 MG PER TABLET    Take 1 tablet by mouth every 12 (twelve) hours.      Johnna Acosta, MD 04/25/14 [phone]

## 2014-04-25 NOTE — Discharge Instructions (Signed)
Take the antibiotics twice daily for 10 days  Please call your doctor for a followup appointment within 24-48 hours. When you talk to your doctor please let them know that you were seen in the emergency department and have them acquire all of your records so that they can discuss the findings with you and formulate a treatment plan to fully care for your new and ongoing problems.

## 2014-04-25 NOTE — ED Notes (Signed)
Patient c/o abscess to right buttock that started yesterday and is progressively getting worse. Denies any drainage or fevers. Patient also c/o right hip pain that radiates down right leg. Denies any injury.

## 2014-05-04 ENCOUNTER — Encounter (HOSPITAL_COMMUNITY): Payer: Self-pay

## 2014-05-04 ENCOUNTER — Emergency Department (HOSPITAL_COMMUNITY)
Admission: EM | Admit: 2014-05-04 | Discharge: 2014-05-05 | Disposition: A | Discharge status: Home / Self Care | Payer: Self-pay | Attending: Emergency Medicine | Admitting: Emergency Medicine

## 2014-05-04 DIAGNOSIS — Z862 Personal history of diseases of the blood and blood-forming organs and certain disorders involving the immune mechanism: Secondary | ICD-10-CM | POA: Insufficient documentation

## 2014-05-04 DIAGNOSIS — Z792 Long term (current) use of antibiotics: Secondary | ICD-10-CM | POA: Insufficient documentation

## 2014-05-04 DIAGNOSIS — Z7952 Long term (current) use of systemic steroids: Secondary | ICD-10-CM | POA: Insufficient documentation

## 2014-05-04 DIAGNOSIS — Z791 Long term (current) use of non-steroidal anti-inflammatories (NSAID): Secondary | ICD-10-CM | POA: Insufficient documentation

## 2014-05-04 DIAGNOSIS — M5431 Sciatica, right side: Secondary | ICD-10-CM | POA: Insufficient documentation

## 2014-05-04 DIAGNOSIS — Z720 Tobacco use: Secondary | ICD-10-CM | POA: Insufficient documentation

## 2014-05-04 DIAGNOSIS — E780 Pure hypercholesterolemia: Secondary | ICD-10-CM | POA: Insufficient documentation

## 2014-05-04 DIAGNOSIS — I10 Essential (primary) hypertension: Secondary | ICD-10-CM | POA: Insufficient documentation

## 2014-05-04 DIAGNOSIS — Z880 Allergy status to penicillin: Secondary | ICD-10-CM | POA: Insufficient documentation

## 2014-05-04 NOTE — ED Notes (Signed)
Pain to right leg x 2 weeks

## 2014-05-05 ENCOUNTER — Other Ambulatory Visit (HOSPITAL_COMMUNITY): Payer: Self-pay | Admitting: Physician Assistant

## 2014-05-05 DIAGNOSIS — Z1231 Encounter for screening mammogram for malignant neoplasm of breast: Secondary | ICD-10-CM

## 2014-05-05 MED ORDER — OXYCODONE-ACETAMINOPHEN 5-325 MG PO TABS: 1.0000 | ORAL_TABLET | ORAL | Status: DC | PRN

## 2014-05-05 MED ORDER — OXYCODONE-ACETAMINOPHEN 5-325 MG PO TABS
1.0000 | ORAL_TABLET | Freq: Once | ORAL | Status: AC
Start: 2014-05-05 — End: 2014-05-05
  Administered 2014-05-05: 1 via ORAL
  Filled 2014-05-05: qty 1

## 2014-05-05 NOTE — Discharge Instructions (Signed)
Sciatica °Sciatica is pain, weakness, numbness, or tingling along your sciatic nerve. The nerve starts in the lower back and runs down the back of each leg. Nerve damage or certain conditions pinch or put pressure on the sciatic nerve. This causes the pain, weakness, and other discomforts of sciatica. °HOME CARE  °· Only take medicine as told by your doctor. °· Apply ice to the affected area for 20 minutes. Do this 3-4 times a day for the first 48-72 hours. Then try heat in the same way. °· Exercise, stretch, or do your usual activities if these do not make your pain worse. °· Go to physical therapy as told by your doctor. °· Keep all doctor visits as told. °· Do not wear high heels or shoes that are not supportive. °· Get a firm mattress if your mattress is too soft to lessen pain and discomfort. °GET HELP RIGHT AWAY IF:  °· You cannot control when you poop (bowel movement) or pee (urinate). °· You have more weakness in your lower back, lower belly (pelvis), butt (buttocks), or legs. °· You have redness or puffiness (swelling) of your back. °· You have a burning feeling when you pee. °· You have pain that gets worse when you lie down. °· You have pain that wakes you from your sleep. °· Your pain is worse than past pain. °· Your pain lasts longer than 4 weeks. °· You are suddenly losing weight without reason. °MAKE SURE YOU:  °· Understand these instructions. °· Will watch this condition. °· Will get help right away if you are not doing well or get worse. °Document Released: 12/27/2007 Document Revised: 09/18/2011 Document Reviewed: 07/29/2011 °[employer]® Patient Information ©2015 [employer]. This information is not intended to replace advice given to you by your health care provider. Make sure you discuss any questions you have with your health care provider. ° °

## 2014-05-05 NOTE — ED Provider Notes (Signed)
CSN: [id number]     Arrival date & time 05/04/14  2331 History   First MD Initiated Contact with Patient 05/04/14 2354     Chief Complaint  Patient presents with  . Leg Pain     (Consider location/radiation/quality/duration/timing/severity/associated sxs/prior Treatment) HPI  Amber Drake is a 54 y.o. female who presents to the Emergency Department complaining of right buttock, hip and thigh pain. Pain has been persisting for two weeks.  She describes the pain as sharp and stabbing and seems worse at night and with weight bearing.  Pain radiates from her right buttocks down her thigh, into her groin and down the front of her lower leg.  She states symptoms are similar to previous.  She has taken naprosyn without relief.  She denies fever, chills, urine or bowel changes, recent injury, abdominal pain, numbness or weakness of the extremity.  She was seen here on 04/25/14 for boils, and reports improvement.     Past Medical History  Diagnosis Date  . Sickle cell trait   . Hypertension     does not take meds  . Blood dyscrasia     sickle cell trait  . Hypercholesterolemia    Past Surgical History  Procedure Laterality Date  . Appendectomy    . Incision and drainage perirectal abscess  12/21/09  . Cesarean section      x 2  . Proctoscopy  10/17/2010    Procedure: PROCTOSCOPY;  Surgeon: Scherry Ran;  Location: [facility];  Service: General;  Laterality: N/A;  Rigid Proctoscopy/Possible Fistula in Ano  Procedure ended at 1003  . Therapeutic abortion      x2  . Carpal tunnel release  03/23/2011    Procedure: CARPAL TUNNEL RELEASE;  Surgeon: Sanjuana Kava;  Location: [facility];  Service: Orthopedics;  Laterality: Left;  . Carpal tunnel release  05/10/2011    Procedure: CARPAL TUNNEL RELEASE;  Surgeon: Sanjuana Kava, MD;  Location: [facility];  Service: Orthopedics;  Laterality: Right;   Family History  Problem Relation Age of Onset  . Kidney failure Daughter   . Sickle cell anemia Daughter    . Sickle cell anemia Son   . Anesthesia problems Neg Hx   . Hypotension Neg Hx   . Malignant hyperthermia Neg Hx   . Pseudochol deficiency Neg Hx    History  Substance Use Topics  . Smoking status: Current Every Day Smoker -- 0.50 packs/day for 30 years    Types: Cigarettes  . Smokeless tobacco: Not on file  . Alcohol Use: No     Comment: one year and three months   OB History    Gravida Para Term Preterm AB TAB SAB Ectopic Multiple Living   4 2 2  2     2      Review of Systems  Constitutional: Negative for fever.  Respiratory: Negative for shortness of breath.   Gastrointestinal: Negative for vomiting, abdominal pain and constipation.  Genitourinary: Negative for dysuria, hematuria, flank pain, decreased urine volume and difficulty urinating.  Musculoskeletal: Positive for back pain. Negative for joint swelling.  Skin: Negative for rash.  Neurological: Negative for weakness and numbness.  All other systems reviewed and are negative.     Allergies  Penicillins; Penicillins cross reactors; and Tape  Home Medications   Prior to Admission medications   Medication Sig Start Date End Date Taking? Authorizing Provider  clindamycin (CLEOCIN) 300 MG capsule Take 1 capsule (300 mg total) by mouth every 6 (six) hours. 03/02/14  Yes Glendell Docker, NP  cyclobenzaprine (FLEXERIL) 10 MG tablet Take 1 tablet (10 mg total) by mouth 2 (two) times daily as needed for muscle spasms. 03/23/14  Yes [place], NP  lisinopril (PRINIVIL,ZESTRIL) 10 MG tablet Take 10 mg by mouth daily.   Yes Historical Provider, MD  Multiple Vitamin (MULTIVITAMIN WITH MINERALS) TABS Take 1 tablet by mouth daily.   Yes Historical Provider, MD  ranitidine (ZANTAC) 150 MG tablet Take 150 mg by mouth 2 (two) times daily.   Yes Historical Provider, MD  simvastatin (ZOCOR) 10 MG tablet Take 10 mg by mouth daily.   Yes Historical Provider, MD  sulfamethoxazole-trimethoprim (SEPTRA DS) 800-160 MG per tablet Take  1 tablet by mouth every 12 (twelve) hours. 04/25/14  Yes Johnna Acosta, MD  HYDROcodone-acetaminophen (NORCO/VICODIN) 5-325 MG per tablet Take 1-2 tablets by mouth every 6 (six) hours as needed. Patient not taking: Reported on 04/25/2014 03/02/14   Glendell Docker, NP  hydrocortisone (ANUSOL-HC) 25 MG suppository Place 1 suppository (25 mg total) rectally 2 (two) times daily. For 7 days Patient not taking: Reported on 02/28/2014 07/08/13   Veryl Speak, MD  naproxen (NAPROSYN) 500 MG tablet Take 1 tablet (500 mg total) by mouth 2 (two) times daily. Patient not taking: Reported on 04/25/2014 03/23/14   Ashley Murrain, NP  naproxen (NAPROSYN) 500 MG tablet Take 1 tablet (500 mg total) by mouth 2 (two) times daily with a meal. 04/25/14   Johnna Acosta, MD   BP 140/82 mmHg  Pulse 98  Temp(Src) 98.2 F (36.8 C) (Oral)  Resp 20  SpO2 100%  LMP 08/12/2010 Physical Exam  Constitutional: She is oriented to person, place, and time. She appears well-developed and well-nourished. No distress.  HENT:  Head: Normocephalic and atraumatic.  Neck: Normal range of motion. Neck supple.  Cardiovascular: Normal rate, regular rhythm, normal heart sounds and intact distal pulses.   No murmur heard. Pulmonary/Chest: Effort normal and breath sounds normal. No respiratory distress.  Abdominal: Soft. She exhibits no distension. There is no tenderness.  Musculoskeletal: She exhibits tenderness. She exhibits no edema.       Lumbar back: She exhibits tenderness and pain. She exhibits normal range of motion, no swelling, no deformity, no laceration and normal pulse.  ttp of the right lumbar paraspinal muscles and SI joint.  No spinal tenderness.  DP pulses are brisk and symmetrical.  Distal sensation intact.  Hip Flexors/Extensors are intact.  Pt has 5/5 strength against resistance of bilateral lower extremities.     Neurological: She is alert and oriented to person, place, and time. She has normal strength. No sensory  deficit. She exhibits normal muscle tone. Coordination and gait normal.  Reflex Scores:      Patellar reflexes are 2+ on the right side and 2+ on the left side.      Achilles reflexes are 2+ on the right side and 2+ on the left side. Skin: Skin is warm and dry. No rash noted.  Nursing note and vitals reviewed.   ED Course  Procedures (including critical care time) Labs Review Labs Reviewed - No data to display  Imaging Review No results found.   EKG Interpretation None      MDM   Final diagnoses:  Sciatica, right    Pt is well appearing.  No focal neuro deficits on exam, ambulates with steady gait.  Sx's likely related to sciatica.  Pt agrees to close f/u with her PMD and states she has  an upcoming appt with Dr. Luna Glasgow.   Khalidah Herbold L. Vanessa [place], PA-C 05/05/14 [phone]

## 2014-05-05 NOTE — ED Notes (Signed)
Given prepack of percocet 6 tabs to go

## 2014-05-10 MED FILL — Oxycodone w/ Acetaminophen Tab 5-325 MG: ORAL | Qty: 6 | Status: AC

## 2014-05-13 ENCOUNTER — Encounter (HOSPITAL_COMMUNITY): Payer: Self-pay

## 2014-05-16 ENCOUNTER — Encounter (HOSPITAL_COMMUNITY): Payer: Self-pay | Admitting: Emergency Medicine

## 2014-05-16 ENCOUNTER — Emergency Department (HOSPITAL_COMMUNITY)
Admission: EM | Admit: 2014-05-16 | Discharge: 2014-05-16 | Disposition: A | Discharge status: Home / Self Care | Payer: Self-pay | Attending: Emergency Medicine | Admitting: Emergency Medicine

## 2014-05-16 DIAGNOSIS — Z79899 Other long term (current) drug therapy: Secondary | ICD-10-CM | POA: Insufficient documentation

## 2014-05-16 DIAGNOSIS — M5431 Sciatica, right side: Secondary | ICD-10-CM | POA: Insufficient documentation

## 2014-05-16 DIAGNOSIS — I10 Essential (primary) hypertension: Secondary | ICD-10-CM | POA: Insufficient documentation

## 2014-05-16 DIAGNOSIS — E780 Pure hypercholesterolemia: Secondary | ICD-10-CM | POA: Insufficient documentation

## 2014-05-16 DIAGNOSIS — Z9889 Other specified postprocedural states: Secondary | ICD-10-CM | POA: Insufficient documentation

## 2014-05-16 DIAGNOSIS — Z880 Allergy status to penicillin: Secondary | ICD-10-CM | POA: Insufficient documentation

## 2014-05-16 DIAGNOSIS — Z720 Tobacco use: Secondary | ICD-10-CM | POA: Insufficient documentation

## 2014-05-16 DIAGNOSIS — Z862 Personal history of diseases of the blood and blood-forming organs and certain disorders involving the immune mechanism: Secondary | ICD-10-CM | POA: Insufficient documentation

## 2014-05-16 MED ORDER — OXYCODONE-ACETAMINOPHEN 5-325 MG PO TABS: 2.0000 | ORAL_TABLET | ORAL | Status: DC | PRN

## 2014-05-16 MED ORDER — METHOCARBAMOL 500 MG PO TABS: 500.0000 mg | ORAL_TABLET | Freq: Two times a day (BID) | ORAL | Status: DC

## 2014-05-16 MED ORDER — PREDNISONE (PAK) 10 MG PO TABS: ORAL_TABLET | Freq: Every day | ORAL | Status: DC

## 2014-05-16 NOTE — ED Provider Notes (Signed)
CSN: [id number]     Arrival date & time 05/16/14  1011 History   First MD Initiated Contact with Patient 05/16/14 1027     Chief Complaint  Patient presents with  . Back Pain     (Consider location/radiation/quality/duration/timing/severity/associated sxs/prior Treatment) Patient is a 54 y.o. female presenting with back pain. The history is provided by the patient.  Back Pain Location:  Lumbar spine Quality:  Shooting Radiates to:  R posterior upper leg Pain severity:  Severe Pain is:  Same all the time Onset quality:  Gradual Timing:  Constant Progression:  Worsening Chronicity:  Chronic Relieved by:  Nothing Worsened by:  Movement, bending and ambulation  Amber Drake is a 54 y.o. female who presents to the ED with low back pain that goes to the right leg. She has been evaluated several times in the past for the same. Patient denies any new or recent injuries. She denies loss of control of bladder or bowels. She denies fever, chills or UTI symptoms.   Past Medical History  Diagnosis Date  . Sickle cell trait   . Hypertension     does not take meds  . Blood dyscrasia     sickle cell trait  . Hypercholesterolemia    Past Surgical History  Procedure Laterality Date  . Appendectomy    . Incision and drainage perirectal abscess  12/21/09  . Cesarean section      x 2  . Proctoscopy  10/17/2010    Procedure: PROCTOSCOPY;  Surgeon: Scherry Ran;  Location: [facility];  Service: General;  Laterality: N/A;  Rigid Proctoscopy/Possible Fistula in Ano  Procedure ended at 1003  . Therapeutic abortion      x2  . Carpal tunnel release  03/23/2011    Procedure: CARPAL TUNNEL RELEASE;  Surgeon: Sanjuana Kava;  Location: [facility];  Service: Orthopedics;  Laterality: Left;  . Carpal tunnel release  05/10/2011    Procedure: CARPAL TUNNEL RELEASE;  Surgeon: Sanjuana Kava, MD;  Location: [facility];  Service: Orthopedics;  Laterality: Right;   Family History  Problem Relation Age of Onset    . Kidney failure Daughter   . Sickle cell anemia Daughter   . Sickle cell anemia Son   . Anesthesia problems Neg Hx   . Hypotension Neg Hx   . Malignant hyperthermia Neg Hx   . Pseudochol deficiency Neg Hx    History  Substance Use Topics  . Smoking status: Current Every Day Smoker -- 0.50 packs/day for 30 years    Types: Cigarettes  . Smokeless tobacco: Not on file  . Alcohol Use: No     Comment: one year and three months   OB History    Gravida Para Term Preterm AB TAB SAB Ectopic Multiple Living   4 2 2  2     2      Review of Systems  Musculoskeletal: Positive for back pain.  all other systems negative    Allergies  Penicillins; Penicillins cross reactors; and Tape  Home Medications   Prior to Admission medications   Medication Sig Start Date End Date Taking? Authorizing Provider  lisinopril (PRINIVIL,ZESTRIL) 10 MG tablet Take 10 mg by mouth daily.    Historical Provider, MD  methocarbamol (ROBAXIN) 500 MG tablet Take 1 tablet (500 mg total) by mouth 2 (two) times daily. 05/16/14   Kiing Deakin Bunnie Pion, NP  Multiple Vitamin (MULTIVITAMIN WITH MINERALS) TABS Take 1 tablet by mouth daily.    Historical Provider, MD  oxyCODONE-acetaminophen (PERCOCET/ROXICET)  5-325 MG per tablet Take 2 tablets by mouth every 4 (four) hours as needed for moderate pain or severe pain. 05/16/14   Talayla Doyel Bunnie Pion, NP  predniSONE (STERAPRED UNI-PAK) 10 MG tablet Take by mouth daily. Take 6 tablets PO today and then 5, 4, 3, 2, 1 05/16/14   Aryana Wonnacott Bunnie Pion, NP  ranitidine (ZANTAC) 150 MG tablet Take 150 mg by mouth 2 (two) times daily.    Historical Provider, MD  simvastatin (ZOCOR) 10 MG tablet Take 10 mg by mouth daily.    Historical Provider, MD  sulfamethoxazole-trimethoprim (SEPTRA DS) 800-160 MG per tablet Take 1 tablet by mouth every 12 (twelve) hours. 04/25/14   Johnna Acosta, MD   BP 157/79 mmHg  Pulse 83  Temp(Src) 97.9 F (36.6 C) (Oral)  Resp 18  Ht 5\' 4"  (1.626 m)  Wt 172 lb (78.019 kg)   BMI 29.51 kg/m2  SpO2 100%  LMP 08/12/2010 Physical Exam  Constitutional: She is oriented to person, place, and time. She appears well-developed and well-nourished. No distress.  HENT:  Head: Normocephalic and atraumatic.  Right Ear: Tympanic membrane normal.  Left Ear: Tympanic membrane normal.  Nose: Nose normal.  Mouth/Throat: Uvula is midline, oropharynx is clear and moist and mucous membranes are normal.  Eyes: EOM are normal.  Neck: Normal range of motion. Neck supple.  Cardiovascular: Normal rate and regular rhythm.   Pulmonary/Chest: Effort normal. She has no wheezes. She has no rales.  Abdominal: Soft. Bowel sounds are normal. There is no tenderness.  Musculoskeletal: Normal range of motion.       Lumbar back: She exhibits tenderness, pain and spasm. She exhibits normal range of motion and normal pulse.       Back:  Pedal pulses equal, adequate circulation, good touch sensation.   Neurological: She is alert and oriented to person, place, and time. She has normal strength. No cranial nerve deficit or sensory deficit. Gait normal.  Reflex Scores:      Bicep reflexes are 2+ on the right side and 2+ on the left side.      Brachioradialis reflexes are 2+ on the right side and 2+ on the left side.      Patellar reflexes are 2+ on the right side and 2+ on the left side.      Achilles reflexes are 2+ on the right side and 2+ on the left side. Skin: Skin is warm and dry.  Psychiatric: She has a normal mood and affect. Her behavior is normal.  Nursing note and vitals reviewed.   ED Course  Procedures (  MDM  54 y.o. female with right lower back pain and pain radiating to the right sciatic nerve. Stable for d/c without neuro deficits. Will treat for muscle spasm and inflammation. She is to follow up with her PCP or ortho if symptoms persist. Discussed with the patient and all questioned fully answered. Final diagnoses:  Sciatica, right      [facility], NP 05/16/14  [place], MD 05/18/14 2152

## 2014-05-16 NOTE — ED Notes (Signed)
Pt reports lower back pain radiating to right leg. Pt reports seen for same in the past several times. Pt denies any new or recent injury.

## 2014-05-17 MED FILL — Oxycodone w/ Acetaminophen Tab 5-325 MG: ORAL | Qty: 6 | Status: AC

## 2014-05-30 ENCOUNTER — Encounter (HOSPITAL_COMMUNITY): Payer: Self-pay | Admitting: Emergency Medicine

## 2014-05-30 ENCOUNTER — Emergency Department (HOSPITAL_COMMUNITY)
Admission: EM | Admit: 2014-05-30 | Discharge: 2014-05-31 | Disposition: A | Discharge status: Home / Self Care | Payer: Self-pay | Attending: Emergency Medicine | Admitting: Emergency Medicine

## 2014-05-30 DIAGNOSIS — Z862 Personal history of diseases of the blood and blood-forming organs and certain disorders involving the immune mechanism: Secondary | ICD-10-CM | POA: Insufficient documentation

## 2014-05-30 DIAGNOSIS — Z9889 Other specified postprocedural states: Secondary | ICD-10-CM | POA: Insufficient documentation

## 2014-05-30 DIAGNOSIS — R110 Nausea: Secondary | ICD-10-CM | POA: Insufficient documentation

## 2014-05-30 DIAGNOSIS — Z880 Allergy status to penicillin: Secondary | ICD-10-CM | POA: Insufficient documentation

## 2014-05-30 DIAGNOSIS — I10 Essential (primary) hypertension: Secondary | ICD-10-CM | POA: Insufficient documentation

## 2014-05-30 DIAGNOSIS — L02211 Cutaneous abscess of abdominal wall: Secondary | ICD-10-CM | POA: Insufficient documentation

## 2014-05-30 DIAGNOSIS — E780 Pure hypercholesterolemia: Secondary | ICD-10-CM | POA: Insufficient documentation

## 2014-05-30 DIAGNOSIS — L0291 Cutaneous abscess, unspecified: Secondary | ICD-10-CM

## 2014-05-30 DIAGNOSIS — Z720 Tobacco use: Secondary | ICD-10-CM | POA: Insufficient documentation

## 2014-05-30 DIAGNOSIS — Z792 Long term (current) use of antibiotics: Secondary | ICD-10-CM | POA: Insufficient documentation

## 2014-05-30 DIAGNOSIS — Z79899 Other long term (current) drug therapy: Secondary | ICD-10-CM | POA: Insufficient documentation

## 2014-05-30 DIAGNOSIS — Z7952 Long term (current) use of systemic steroids: Secondary | ICD-10-CM | POA: Insufficient documentation

## 2014-05-30 MED ORDER — MORPHINE SULFATE 4 MG/ML IJ SOLN
4.0000 mg | Freq: Once | INTRAMUSCULAR | Status: AC
  Administered 2014-05-30: 4 mg via INTRAMUSCULAR
  Filled 2014-05-30: qty 1

## 2014-05-30 MED ORDER — LIDOCAINE-EPINEPHRINE (PF) 2 %-1:200000 IJ SOLN
10.0000 mL | Freq: Once | INTRAMUSCULAR | Status: AC
  Administered 2014-05-31: 10 mL
  Filled 2014-05-30: qty 20

## 2014-05-30 NOTE — ED Provider Notes (Signed)
CSN: [id number]     Arrival date & time 05/30/14  2247 History  This chart was scribed for Janice Norrie, MD by Hilda Lias, ED Scribe. This patient was seen in room APA16A/[facility]16A and the patient's care was started at 11:33 PM.    Chief Complaint  Patient presents with  . Abscess     The history is provided by the patient. No language interpreter was used.     HPI Comments: Amber Drake is a 54 y.o. female who presents to the Emergency Department complaining of an abscess on the midline of her suprapubic abdomen where she has a vertical C-section scar that has been present for two days. Pt states that her pain is worsening, and states that she feels nauseous as well. Pt states that she feels she has a boil in the area, and notes that over the past couple of weeks it has been growing and shrinking in size sporadically. Pt denies dysuria, fever, or vomiting.   PCP Dr Legrand Rams  Past Medical History  Diagnosis Date  . Sickle cell trait   . Hypertension     does not take meds  . Blood dyscrasia     sickle cell trait  . Hypercholesterolemia    Past Surgical History  Procedure Laterality Date  . Appendectomy    . Incision and drainage perirectal abscess  12/21/09  . Cesarean section      x 2  . Proctoscopy  10/17/2010    Procedure: PROCTOSCOPY;  Surgeon: Scherry Ran;  Location: [facility];  Service: General;  Laterality: N/A;  Rigid Proctoscopy/Possible Fistula in Ano  Procedure ended at 1003  . Therapeutic abortion      x2  . Carpal tunnel release  03/23/2011    Procedure: CARPAL TUNNEL RELEASE;  Surgeon: Sanjuana Kava;  Location: [facility];  Service: Orthopedics;  Laterality: Left;  . Carpal tunnel release  05/10/2011    Procedure: CARPAL TUNNEL RELEASE;  Surgeon: Sanjuana Kava, MD;  Location: [facility];  Service: Orthopedics;  Laterality: Right;   Family History  Problem Relation Age of Onset  . Kidney failure Daughter   . Sickle cell anemia Daughter   . Sickle cell anemia Son   .  Anesthesia problems Neg Hx   . Hypotension Neg Hx   . Malignant hyperthermia Neg Hx   . Pseudochol deficiency Neg Hx    History  Substance Use Topics  . Smoking status: Current Every Day Smoker -- 0.50 packs/day for 30 years    Types: Cigarettes  . Smokeless tobacco: Not on file  . 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  Gastrointestinal: Positive for nausea. Negative for vomiting.  Genitourinary: Negative for dysuria.  All other systems reviewed and are negative.     Allergies  Penicillins; Penicillins cross reactors; and Tape  Home Medications   Prior to Admission medications   Medication Sig Start Date End Date Taking? Authorizing Provider  lisinopril (PRINIVIL,ZESTRIL) 10 MG tablet Take 10 mg by mouth daily.   Yes Historical Provider, MD  methocarbamol (ROBAXIN) 500 MG tablet Take 1 tablet (500 mg total) by mouth 2 (two) times daily. 05/16/14  Yes Hope Bunnie Pion, NP  Multiple Vitamin (MULTIVITAMIN WITH MINERALS) TABS Take 1 tablet by mouth daily.   Yes Historical Provider, MD  oxyCODONE-acetaminophen (PERCOCET/ROXICET) 5-325 MG per tablet Take 2 tablets by mouth every 4 (four) hours as needed for moderate pain or severe pain. 05/16/14  Yes Hope Bunnie Pion, NP  ranitidine (ZANTAC) 150 MG tablet Take 150 mg by mouth 2 (two) times daily.   Yes Historical Provider, MD  simvastatin (ZOCOR) 10 MG tablet Take 10 mg by mouth daily.   Yes Historical Provider, MD  predniSONE (STERAPRED UNI-PAK) 10 MG tablet Take by mouth daily. Take 6 tablets PO today and then 5, 4, 3, 2, 1 05/16/14   Hope Bunnie Pion, NP  sulfamethoxazole-trimethoprim (SEPTRA DS) 800-160 MG per tablet Take 1 tablet by mouth every 12 (twelve) hours. 04/25/14   Johnna Acosta, MD   BP 169/78 mmHg  Pulse 90  Temp(Src)  99 F (37.2 C) (Oral)  Resp 20  Ht 5\' 9"  (1.753 m)  Wt 180 lb (81.647 kg)  BMI 26.57 kg/m2  SpO2 100%  LMP 08/12/2010  Vital signs normal   Physical Exam  Constitutional: She is oriented to person, place, and time. She appears well-developed and well-nourished.  Non-toxic appearance. She does not appear ill. No distress.  HENT:  Head: Normocephalic and atraumatic.  Right Ear: External ear normal.  Left Ear: External ear normal.  Nose: Nose normal. No mucosal edema or rhinorrhea.  Mouth/Throat: Mucous membranes are normal. No dental abscesses or uvula swelling.  Eyes: Conjunctivae and EOM are normal. Pupils are equal, round, and reactive to light.  Neck: Normal range of motion and full passive range of motion without pain. Neck supple.  Pulmonary/Chest: Effort normal. No respiratory distress. She has no rhonchi. She exhibits no crepitus.  Abdominal: Soft. Normal appearance and bowel sounds are normal. She exhibits no distension. There is no tenderness. There is no rebound and no guarding.    Musculoskeletal: Normal range of motion. She exhibits no edema or tenderness.  Moves all extremities well.   Neurological: She is alert and oriented to person, place, and time. She has normal strength. No cranial nerve deficit.  Skin: Skin is warm, dry and intact. No rash noted. No erythema. No pallor.  Pt has an area of swelling in the inferior portion of vertical C-section scar with 2x2 cm swelling and firmness underneath it.   Psychiatric: She has a normal mood and affect. Her speech is normal and behavior is normal. Her mood appears not anxious.  Nursing note and vitals reviewed.   ED Course  Procedures (including critical care time) Medications  lidocaine-EPINEPHrine (XYLOCAINE W/EPI) 2 %-1:200000 (PF) injection 10 mL (not administered)  povidone-iodine (BETADINE) 10 % external solution (not administered)  morphine 4 MG/ML injection 4 mg (4 mg Intramuscular Given 05/30/14 2356)   We  discussed not using antibiotics since she does not have cellulitis around the area.   DIAGNOSTIC STUDIES: Oxygen Saturation is 100% on RA, normal by my interpretation.    COORDINATION OF CARE: 11:38 PM Discussed treatment plan with pt at bedside and pt agreed to plan.  INCISION AND DRAINAGE Performed by: Rolland Porter L Consent: Verbal consent obtained. Risks and benefits: risks, benefits and alternatives were discussed Type: abscess  Body area: suprapubic inferior incisional scar  Anesthesia: local infiltration  Incision was made with a 11 scalpel.  Local anesthetic: lidocaine 1% + 1 epinephrine  Anesthetic total: 1 ml  Complexity: complex Blunt dissection to break up loculations  Drainage: purulent  Drainage amount:  mild  Packing material: 1/4 in iodoform gauze  Patient tolerance: Patient tolerated the procedure well with no immediate complications.     Labs Review Labs Reviewed - No data to display  Imaging Review No results found.   EKG Interpretation None      MDM   Final diagnoses:  Abscess   New Prescriptions   TRAMADOL (ULTRAM) 50 MG TABLET    Take 2 tablets (100 mg total) by mouth every 6 (six) hours as needed.    Plan discharge  Rolland Porter, MD, FACEP   I personally performed the services described in this documentation, which was scribed in my presence. The recorded information has been reviewed and considered.  Rolland Porter, MD, FACEP    Janice Norrie, MD 05/31/14 0200

## 2014-05-30 NOTE — ED Notes (Signed)
Patient c/o abscess to suprapubic area x 2 days; states pain is getting worse.

## 2014-05-31 MED ORDER — POVIDONE-IODINE 10 % EX SOLN
CUTANEOUS | Status: AC
  Filled 2014-05-31: qty 118

## 2014-05-31 MED ORDER — TRAMADOL HCL 50 MG PO TABS: 100.0000 mg | ORAL_TABLET | Freq: Four times a day (QID) | ORAL | Status: DC | PRN

## 2014-05-31 NOTE — Discharge Instructions (Signed)
Try to leave the packing in for 2 days and you can remove it while in the shower or bathing in a tub. Recheck if you get a fever, worsening swelling or pain.

## 2014-06-17 ENCOUNTER — Other Ambulatory Visit (HOSPITAL_COMMUNITY): Payer: Self-pay | Admitting: Physician Assistant

## 2014-06-17 DIAGNOSIS — Z1231 Encounter for screening mammogram for malignant neoplasm of breast: Secondary | ICD-10-CM

## 2014-06-28 ENCOUNTER — Ambulatory Visit (HOSPITAL_COMMUNITY)
Admission: RE | Admit: 2014-06-28 | Discharge: 2014-06-28 | Disposition: A | Discharge status: Home / Self Care | Payer: Self-pay | Source: Ambulatory Visit | Attending: Physician Assistant | Admitting: Physician Assistant

## 2014-06-28 DIAGNOSIS — Z1231 Encounter for screening mammogram for malignant neoplasm of breast: Secondary | ICD-10-CM | POA: Insufficient documentation

## 2014-09-18 ENCOUNTER — Encounter (HOSPITAL_COMMUNITY): Payer: Self-pay | Admitting: Emergency Medicine

## 2014-09-18 ENCOUNTER — Emergency Department (HOSPITAL_COMMUNITY)
Admission: EM | Admit: 2014-09-18 | Discharge: 2014-09-18 | Disposition: A | Discharge status: Home / Self Care | Payer: Self-pay | Attending: Emergency Medicine | Admitting: Emergency Medicine

## 2014-09-18 DIAGNOSIS — L02421 Furuncle of right axilla: Secondary | ICD-10-CM | POA: Insufficient documentation

## 2014-09-18 DIAGNOSIS — Z720 Tobacco use: Secondary | ICD-10-CM | POA: Insufficient documentation

## 2014-09-18 DIAGNOSIS — N764 Abscess of vulva: Secondary | ICD-10-CM | POA: Insufficient documentation

## 2014-09-18 DIAGNOSIS — M5431 Sciatica, right side: Secondary | ICD-10-CM | POA: Insufficient documentation

## 2014-09-18 DIAGNOSIS — E780 Pure hypercholesterolemia: Secondary | ICD-10-CM | POA: Insufficient documentation

## 2014-09-18 DIAGNOSIS — Z862 Personal history of diseases of the blood and blood-forming organs and certain disorders involving the immune mechanism: Secondary | ICD-10-CM | POA: Insufficient documentation

## 2014-09-18 DIAGNOSIS — Z79899 Other long term (current) drug therapy: Secondary | ICD-10-CM | POA: Insufficient documentation

## 2014-09-18 DIAGNOSIS — I10 Essential (primary) hypertension: Secondary | ICD-10-CM | POA: Insufficient documentation

## 2014-09-18 DIAGNOSIS — L739 Follicular disorder, unspecified: Secondary | ICD-10-CM

## 2014-09-18 DIAGNOSIS — Z880 Allergy status to penicillin: Secondary | ICD-10-CM | POA: Insufficient documentation

## 2014-09-18 MED ORDER — SULFAMETHOXAZOLE-TRIMETHOPRIM 800-160 MG PO TABS: 1.0000 | ORAL_TABLET | Freq: Two times a day (BID) | ORAL | Status: AC

## 2014-09-18 MED ORDER — METHOCARBAMOL 500 MG PO TABS: 500.0000 mg | ORAL_TABLET | Freq: Three times a day (TID) | ORAL | Status: DC

## 2014-09-18 MED ORDER — MUPIROCIN 2 % EX OINT: TOPICAL_OINTMENT | CUTANEOUS | Status: DC

## 2014-09-18 MED ORDER — ONDANSETRON 8 MG PO TBDP
8.0000 mg | ORAL_TABLET | Freq: Once | ORAL | Status: AC
  Administered 2014-09-18: 8 mg via ORAL
  Filled 2014-09-18: qty 1

## 2014-09-18 NOTE — ED Notes (Signed)
Pt c/o abscess to right axilla and abscess to bilateral groin. Pt also c/o right leg and left foot pain. Denies injury. nad noted.

## 2014-09-18 NOTE — ED Provider Notes (Signed)
CSN: [id number]     Arrival date & time 09/18/14  0941 History   First MD Initiated Contact with Patient 09/18/14 [phone]     Chief Complaint  Patient presents with  . Abscess     (Consider location/radiation/quality/duration/timing/severity/associated sxs/prior Treatment) HPI   Amber Drake is a 54 y.o. female who presents to the Emergency Department complaining of recurrent "boils" to her vagina and one to her right armpit.  She states they have been present for one week and appear to be healing, but are still tender and drain occasionally.  She also c/o pain to her right lower back that radiates down her buttock, hip and into her right leg.  She describes the pain as sharp and burning and worse with excessive sitting and weight bearing.  She denies fever, chills, vomiting, urine or bowel incontinence/retention, numbness or weakness of the lower extremities.      Past Medical History  Diagnosis Date  . Sickle cell trait   . Hypertension     does not take meds  . Blood dyscrasia     sickle cell trait  . Hypercholesterolemia    Past Surgical History  Procedure Laterality Date  . Appendectomy    . Incision and drainage perirectal abscess  12/21/09  . Cesarean section      x 2  . Proctoscopy  10/17/2010    Procedure: PROCTOSCOPY;  Surgeon: Scherry Ran;  Location: [facility];  Service: General;  Laterality: N/A;  Rigid Proctoscopy/Possible Fistula in Ano  Procedure ended at 1003  . Therapeutic abortion      x2  . Carpal tunnel release  03/23/2011    Procedure: CARPAL TUNNEL RELEASE;  Surgeon: Sanjuana Kava;  Location: [facility];  Service: Orthopedics;  Laterality: Left;  . Carpal tunnel release  05/10/2011    Procedure: CARPAL TUNNEL RELEASE;  Surgeon: Sanjuana Kava, MD;  Location: [facility];  Service: Orthopedics;  Laterality: Right;   Family History  Problem Relation Age of Onset  . Kidney failure Daughter   . Sickle cell anemia Daughter   . Sickle cell anemia Son   .  Anesthesia problems Neg Hx   . Hypotension Neg Hx   . Malignant hyperthermia Neg Hx   . Pseudochol deficiency Neg Hx    History  Substance Use Topics  . Smoking status: Current Every Day Smoker -- 0.50 packs/day for 30 years    Types: Cigarettes  . Smokeless tobacco: Not on file  . Alcohol Use: No     Comment: one year and three months   OB History    Gravida Para Term Preterm AB TAB SAB Ectopic Multiple Living   4 2 2  2     2      Review of Systems  Constitutional: Negative for fever, chills and appetite change.  HENT: Negative for sore throat.   Gastrointestinal: Negative for nausea, vomiting and abdominal pain.  Genitourinary: Negative for dysuria, vaginal bleeding, vaginal discharge and difficulty urinating.  Musculoskeletal: Negative for joint swelling and arthralgias.  Skin: Negative for color change.       Abscess to vagina and right armpit  Neurological: Negative for dizziness, weakness and numbness.  Hematological: Negative for adenopathy.  All other systems reviewed and are negative.     Allergies  Penicillins; Penicillins cross reactors; and Tape  Home Medications   Prior to Admission medications   Medication Sig Start Date End Date Taking? Authorizing Provider  lisinopril (PRINIVIL,ZESTRIL) 10 MG tablet Take 10 mg by mouth  daily.    Historical Provider, MD  methocarbamol (ROBAXIN) 500 MG tablet Take 1 tablet (500 mg total) by mouth 2 (two) times daily. 05/16/14   Hope Bunnie Pion, NP  Multiple Vitamin (MULTIVITAMIN WITH MINERALS) TABS Take 1 tablet by mouth daily.    Historical Provider, MD  oxyCODONE-acetaminophen (PERCOCET/ROXICET) 5-325 MG per tablet Take 2 tablets by mouth every 4 (four) hours as needed for moderate pain or severe pain. 05/16/14   Hope Bunnie Pion, NP  predniSONE (STERAPRED UNI-PAK) 10 MG tablet Take by mouth daily. Take 6 tablets PO today and then 5, 4, 3, 2, 1 05/16/14   Hope Bunnie Pion, NP  ranitidine (ZANTAC) 150 MG tablet Take 150 mg by mouth 2  (two) times daily.    Historical Provider, MD  simvastatin (ZOCOR) 10 MG tablet Take 10 mg by mouth daily.    Historical Provider, MD  sulfamethoxazole-trimethoprim (SEPTRA DS) 800-160 MG per tablet Take 1 tablet by mouth every 12 (twelve) hours. 04/25/14   Noemi Chapel, MD  traMADol (ULTRAM) 50 MG tablet Take 2 tablets (100 mg total) by mouth every 6 (six) hours as needed. 05/31/14   Rolland Porter, MD   BP 152/85 mmHg  Pulse 65  Temp(Src) 98.5 F (36.9 C)  Resp 18  Ht 5\' 9"  (1.753 m)  Wt 177 lb (80.287 kg)  BMI 26.13 kg/m2  SpO2 100%  LMP 08/12/2010 Physical Exam  Constitutional: She is oriented to person, place, and time. She appears well-developed and well-nourished. No distress.  HENT:  Head: Normocephalic and atraumatic.  Neck: Normal range of motion. Neck supple.  Cardiovascular: Normal rate, regular rhythm, normal heart sounds and intact distal pulses.   No murmur heard. Pulmonary/Chest: Effort normal and breath sounds normal. No respiratory distress.  Abdominal: Soft. She exhibits no distension. There is no tenderness. There is no rebound and no guarding.  Musculoskeletal: She exhibits tenderness. She exhibits no edema.       Lumbar back: She exhibits tenderness and pain. She exhibits normal range of motion, no swelling, no deformity, no laceration and normal pulse.  ttp of the right lumbar paraspinal muscles and SI joint space.  No spinal tenderness.  DP pulses are brisk and symmetrical.  Distal sensation intact.  Hip Flexors/Extensors are intact.  Pt has 5/5 strength against resistance of bilateral lower extremities.     Neurological: She is alert and oriented to person, place, and time. She has normal strength. No sensory deficit. She exhibits normal muscle tone. Coordination and gait normal.  Reflex Scores:      Patellar reflexes are 2+ on the right side and 2+ on the left side.      Achilles reflexes are 2+ on the right side and 2+ on the left side. Skin: Skin is warm and dry.  No rash noted.  Two small papules to vulva with mild central crusting.  Appear to be healing.  Flesh colored papule  To right axilla.  No induration or erythema.    Nursing note and vitals reviewed.   ED Course  Procedures (including critical care time) Labs Review Labs Reviewed - No data to display  Imaging Review No results found.   EKG Interpretation None      MDM   Final diagnoses:  Folliculitis  Sciatica neuralgia, right    Pt is well appearing.  Vitals stable.  Has two healing pustules to the vulva and one to the right axilla.  No indication for I&D at this time.  Also has right  sided sciatica that is recurrent.  Ambulates with steady gait.  No concerning sx's for emergent neurological or infectious process. Agrees to tx with bactroban oint, bactrim and robaxin    Kem Parkinson, PA-C 09/18/14 [place], DO 09/18/14 1433

## 2014-09-18 NOTE — Discharge Instructions (Signed)
Folliculitis  Folliculitis is redness, soreness, and swelling (inflammation) of the hair follicles. This condition can occur anywhere on the body. People with weakened immune systems, diabetes, or obesity have a greater risk of getting folliculitis. CAUSES  Bacterial infection. This is the most common cause.  Fungal infection.  Viral infection.  Contact with certain chemicals, especially oils and tars. Long-term folliculitis can result from bacteria that live in the nostrils. The bacteria may trigger multiple outbreaks of folliculitis over time. SYMPTOMS Folliculitis most commonly occurs on the scalp, thighs, legs, back, buttocks, and areas where hair is shaved frequently. An early sign of folliculitis is a small, white or yellow, pus-filled, itchy lesion (pustule). These lesions appear on a red, inflamed follicle. They are usually less than 0.2 inches (5 mm) wide. When there is an infection of the follicle that goes deeper, it becomes a boil or furuncle. A group of closely packed boils creates a larger lesion (carbuncle). Carbuncles tend to occur in hairy, sweaty areas of the body. DIAGNOSIS  Your caregiver can usually tell what is wrong by doing a physical exam. A sample may be taken from one of the lesions and tested in a lab. This can help determine what is causing your folliculitis. TREATMENT  Treatment may include:  Applying warm compresses to the affected areas.  Taking antibiotic medicines orally or applying them to the skin.  Draining the lesions if they contain a large amount of pus or fluid.  Laser hair removal for cases of long-lasting folliculitis. This helps to prevent regrowth of the hair. HOME CARE INSTRUCTIONS  Apply warm compresses to the affected areas as directed by your caregiver.  If antibiotics are prescribed, take them as directed. Finish them even if you start to feel better.  You may take over-the-counter medicines to relieve itching.  Do not shave  irritated skin.  Follow up with your caregiver as directed. SEEK IMMEDIATE MEDICAL CARE IF:   You have increasing redness, swelling, or pain in the affected area.  You have a fever. MAKE SURE YOU:  Understand these instructions.  Will watch your condition.  Will get help right away if you are not doing well or get worse. Document Released: 05/28/2001 Document Revised: 09/18/2011 Document Reviewed: 06/19/2011 [facility] Patient Information 2015 [place], [place]. This information is not intended to replace advice given to you by your health care provider. Make sure you discuss any questions you have with your health care provider.  Sciatica Sciatica is pain, weakness, numbness, or tingling along your sciatic nerve. The nerve starts in the lower back and runs down the back of each leg. Nerve damage or certain conditions pinch or put pressure on the sciatic nerve. This causes the pain, weakness, and other discomforts of sciatica. HOME CARE   Only take medicine as told by your doctor.  Apply ice to the affected area for 20 minutes. Do this 3-4 times a day for the first 48-72 hours. Then try heat in the same way.  Exercise, stretch, or do your usual activities if these do not make your pain worse.  Go to physical therapy as told by your doctor.  Keep all doctor visits as told.  Do not wear high heels or shoes that are not supportive.  Get a firm mattress if your mattress is too soft to lessen pain and discomfort. GET HELP RIGHT AWAY IF:   You cannot control when you poop (bowel movement) or pee (urinate).  You have more weakness in your lower back, lower belly (  pelvis), butt (buttocks), or legs.  You have redness or puffiness (swelling) of your back.  You have a burning feeling when you pee.  You have pain that gets worse when you lie down.  You have pain that wakes you from your sleep.  Your pain is worse than past pain.  Your pain lasts longer than 4 weeks.  You are  suddenly losing weight without reason. MAKE SURE YOU:   Understand these instructions.  Will watch this condition.  Will get help right away if you are not doing well or get worse. Document Released: 12/27/2007 Document Revised: 09/18/2011 Document Reviewed: 07/29/2011 [facility] Patient Information 2015 [place], [place]. This information is not intended to replace advice given to you by your health care provider. Make sure you discuss any questions you have with your health care provider.

## 2014-10-03 ENCOUNTER — Emergency Department (HOSPITAL_COMMUNITY)
Admission: EM | Admit: 2014-10-03 | Discharge: 2014-10-03 | Disposition: A | Discharge status: Home / Self Care | Payer: Self-pay | Attending: Emergency Medicine | Admitting: Emergency Medicine

## 2014-10-03 ENCOUNTER — Encounter (HOSPITAL_COMMUNITY): Payer: Self-pay | Admitting: Emergency Medicine

## 2014-10-03 DIAGNOSIS — M5431 Sciatica, right side: Secondary | ICD-10-CM

## 2014-10-03 DIAGNOSIS — I10 Essential (primary) hypertension: Secondary | ICD-10-CM | POA: Insufficient documentation

## 2014-10-03 DIAGNOSIS — Z7952 Long term (current) use of systemic steroids: Secondary | ICD-10-CM | POA: Insufficient documentation

## 2014-10-03 DIAGNOSIS — Z79899 Other long term (current) drug therapy: Secondary | ICD-10-CM | POA: Insufficient documentation

## 2014-10-03 DIAGNOSIS — Z862 Personal history of diseases of the blood and blood-forming organs and certain disorders involving the immune mechanism: Secondary | ICD-10-CM | POA: Insufficient documentation

## 2014-10-03 DIAGNOSIS — M5441 Lumbago with sciatica, right side: Secondary | ICD-10-CM | POA: Insufficient documentation

## 2014-10-03 DIAGNOSIS — R200 Anesthesia of skin: Secondary | ICD-10-CM | POA: Insufficient documentation

## 2014-10-03 DIAGNOSIS — Z880 Allergy status to penicillin: Secondary | ICD-10-CM | POA: Insufficient documentation

## 2014-10-03 DIAGNOSIS — Z720 Tobacco use: Secondary | ICD-10-CM | POA: Insufficient documentation

## 2014-10-03 DIAGNOSIS — Z8639 Personal history of other endocrine, nutritional and metabolic disease: Secondary | ICD-10-CM | POA: Insufficient documentation

## 2014-10-03 MED ORDER — HYDROCODONE-ACETAMINOPHEN 5-325 MG PO TABS
1.0000 | ORAL_TABLET | Freq: Once | ORAL | Status: AC
  Administered 2014-10-03: 1 via ORAL
  Filled 2014-10-03: qty 1

## 2014-10-03 MED ORDER — IBUPROFEN 800 MG PO TABS
800.0000 mg | ORAL_TABLET | Freq: Once | ORAL | Status: AC
  Administered 2014-10-03: 800 mg via ORAL
  Filled 2014-10-03: qty 1

## 2014-10-03 MED ORDER — IBUPROFEN 600 MG PO TABS: 600.0000 mg | ORAL_TABLET | Freq: Four times a day (QID) | ORAL | Status: DC | PRN

## 2014-10-03 MED ORDER — HYDROCODONE-ACETAMINOPHEN 5-325 MG PO TABS: 1.0000 | ORAL_TABLET | ORAL | Status: DC | PRN

## 2014-10-03 NOTE — ED Notes (Signed)
Pt requesting medication for pain. PA Evalee Jefferson made aware.

## 2014-10-03 NOTE — Discharge Instructions (Signed)
Sciatica °Sciatica is pain, weakness, numbness, or tingling along the path of the sciatic nerve. The nerve starts in the lower back and runs down the back of each leg. The nerve controls the muscles in the lower leg and in the back of the knee, while also providing sensation to the back of the thigh, lower leg, and the sole of your foot. Sciatica is a symptom of another medical condition. For instance, nerve damage or certain conditions, such as a herniated disk or bone spur on the spine, pinch or put pressure on the sciatic nerve. This causes the pain, weakness, or other sensations normally associated with sciatica. Generally, sciatica only affects one side of the body. °CAUSES  °· Herniated or slipped disc. °· Degenerative disk disease. °· A pain disorder involving the narrow muscle in the buttocks (piriformis syndrome). °· Pelvic injury or fracture. °· Pregnancy. °· Tumor (rare). °SYMPTOMS  °Symptoms can vary from mild to very severe. The symptoms usually travel from the low back to the buttocks and down the back of the leg. Symptoms can include: °· Mild tingling or dull aches in the lower back, leg, or hip. °· Numbness in the back of the calf or sole of the foot. °· Burning sensations in the lower back, leg, or hip. °· Sharp pains in the lower back, leg, or hip. °· Leg weakness. °· Severe back pain inhibiting movement. °These symptoms may get worse with coughing, sneezing, laughing, or prolonged sitting or standing. Also, being overweight may worsen symptoms. °DIAGNOSIS  °Your caregiver will perform a physical exam to look for common symptoms of sciatica. He or she may ask you to do certain movements or activities that would trigger sciatic nerve pain. Other tests may be performed to find the cause of the sciatica. These may include: °· Blood tests. °· X-rays. °· Imaging tests, such as an MRI or CT scan. °TREATMENT  °Treatment is directed at the cause of the sciatic pain. Sometimes, treatment is not necessary  and the pain and discomfort goes away on its own. If treatment is needed, your caregiver may suggest: °· Over-the-counter medicines to relieve pain. °· Prescription medicines, such as anti-inflammatory medicine, muscle relaxants, or narcotics. °· Applying heat or ice to the painful area. °· Steroid injections to lessen pain, irritation, and inflammation around the nerve. °· Reducing activity during periods of pain. °· Exercising and stretching to strengthen your abdomen and improve flexibility of your spine. Your caregiver may suggest losing weight if the extra weight makes the back pain worse. °· Physical therapy. °· Surgery to eliminate what is pressing or pinching the nerve, such as a bone spur or part of a herniated disk. °HOME CARE INSTRUCTIONS  °· Only take over-the-counter or prescription medicines for pain or discomfort as directed by your caregiver. °· Apply ice to the affected area for 20 minutes, 3-4 times a day for the first 48-72 hours. Then try heat in the same way. °· Exercise, stretch, or perform your usual activities if these do not aggravate your pain. °· Attend physical therapy sessions as directed by your caregiver. °· Keep all follow-up appointments as directed by your caregiver. °· Do not wear high heels or shoes that do not provide proper support. °· Check your mattress to see if it is too soft. A firm mattress may lessen your pain and discomfort. °SEEK IMMEDIATE MEDICAL CARE IF:  °· You lose control of your bowel or bladder (incontinence). °· You have increasing weakness in the lower back, pelvis, buttocks,   or legs.  You have redness or swelling of your back.  You have a burning sensation when you urinate.  You have pain that gets worse when you lie down or awakens you at night.  Your pain is worse than you have experienced in the past.  Your pain is lasting longer than 4 weeks.  You are suddenly losing weight without reason. MAKE SURE YOU:  Understand these  instructions.  Will watch your condition.  Will get help right away if you are not doing well or get worse. Document Released: 03/13/2001 Document Revised: 09/18/2011 Document Reviewed: 07/29/2011 [facility] Patient Information 2015 [place], [place]. This information is not intended to replace advice given to you by your health care provider. Make sure you discuss any questions you have with your health care provider.    Do not drive within 4 hours of taking hydrocodone as this will make you drowsy.  Avoid lifting,  Bending,  Twisting or any other activity that worsens your pain over the next week.  Apply an  icepack  to your lower back for 10-15 minutes every 2 hours for the next 2 days.  You should get rechecked if your symptoms are not better over the next 5 days,  Or you develop increased pain,  Weakness in your leg(s) or loss of bladder or bowel function - these are symptoms of a worse injury.

## 2014-10-03 NOTE — ED Notes (Signed)
Patient reports pain shooting from right side of lower back down through entire right leg. States has been diagnosed with sciatica before. States this pain has been going on for approximately 4 months, but has worsened.

## 2014-10-03 NOTE — ED Provider Notes (Signed)
History  This chart was scribed for non-physician practitioner, Evalee Jefferson, PA-C,working with Francine Graven, DO, by Marlowe Kays, ED Scribe. This patient was seen in room APFT22/APFT22 and the patient's care was started at 7:53 PM.  Chief Complaint  Patient presents with  . Back Pain  . Leg Pain   The history is provided by the patient and medical records. No language interpreter was used.    HPI Comments:  Amber Drake is a 54 y.o. female with PMHx of sciatica who presents to the Emergency Department complaining of severe shooting low back pain that radiates down the right leg that worsened a couple of days ago although endorses has had this pain for the past 4 months. She reports associated intermittent numbness in the right foot which she has had with prior episodes of sciatica. She states she has seen Dr. Luna Glasgow for this and was prescribed Naproxen with no significant relief of the pain. She states she has an appt with Dr. Luna Glasgow in 4 days for ongoing care of this problem.   Movement makes the pain worse and she can get relief when lying on her side. Denies tingling or weakness of the lower extremities, bowel or bladder incontinence, fever, chills, abdominal pain, nausea or vomiting. She denies injury, trauma or fall.   Past Medical History  Diagnosis Date  . Sickle cell trait   . Hypertension     does not take meds  . Blood dyscrasia     sickle cell trait  . Hypercholesterolemia    Past Surgical History  Procedure Laterality Date  . Appendectomy    . Incision and drainage perirectal abscess  12/21/09  . Cesarean section      x 2  . Proctoscopy  10/17/2010    Procedure: PROCTOSCOPY;  Surgeon: Scherry Ran;  Location: [facility];  Service: General;  Laterality: N/A;  Rigid Proctoscopy/Possible Fistula in Ano  Procedure ended at 1003  . Therapeutic abortion      x2  . Carpal tunnel release  03/23/2011    Procedure: CARPAL TUNNEL RELEASE;  Surgeon: Sanjuana Kava;   Location: [facility];  Service: Orthopedics;  Laterality: Left;  . Carpal tunnel release  05/10/2011    Procedure: CARPAL TUNNEL RELEASE;  Surgeon: Sanjuana Kava, MD;  Location: [facility];  Service: Orthopedics;  Laterality: Right;   Family History  Problem Relation Age of Onset  . Kidney failure Daughter   . Sickle cell anemia Daughter   . Sickle cell anemia Son   . Anesthesia problems Neg Hx   . Hypotension Neg Hx   . Malignant hyperthermia Neg Hx   . Pseudochol deficiency Neg Hx    History  Substance Use Topics  . Smoking status: Current Every Day Smoker -- 0.50 packs/day for 30 years    Types: Cigarettes  . Smokeless tobacco: Not on file  . Alcohol Use: No     Comment: one year and three months   OB History    Gravida Para Term Preterm AB TAB SAB Ectopic Multiple Living   4 2 2  2     2      Review of Systems  Constitutional: Negative for fever and chills.  Gastrointestinal: Negative for nausea, vomiting and abdominal pain.  Musculoskeletal: Positive for back pain. Negative for myalgias and joint swelling.  Neurological: Positive for numbness. Negative for weakness.    Allergies  Penicillins; Penicillins cross reactors; and Tape  Home Medications   Prior to Admission medications   Medication Sig  Start Date End Date Taking? Authorizing Provider  lisinopril (PRINIVIL,ZESTRIL) 20 MG tablet Take 20 mg by mouth daily.   Yes Historical Provider, MD  Multiple Vitamin (MULTIVITAMIN WITH MINERALS) TABS Take 1 tablet by mouth daily.   Yes Historical Provider, MD  ranitidine (ZANTAC) 150 MG tablet Take 150 mg by mouth 2 (two) times daily.   Yes Historical Provider, MD  simvastatin (ZOCOR) 10 MG tablet Take 10 mg by mouth daily.   Yes Historical Provider, MD  HYDROcodone-acetaminophen (NORCO/VICODIN) 5-325 MG per tablet Take 1 tablet by mouth every 4 (four) hours as needed. 10/03/14   Evalee Jefferson, PA-C  ibuprofen (ADVIL,MOTRIN) 600 MG tablet Take 1 tablet (600 mg total) by mouth every 6  (six) hours as needed. 10/03/14   Evalee Jefferson, PA-C  methocarbamol (ROBAXIN) 500 MG tablet Take 1 tablet (500 mg total) by mouth 3 (three) times daily. 09/18/14   Tammy Triplett, PA-C  mupirocin ointment (BACTROBAN) 2 % Apply to the affected area TID x 10 days 09/18/14   Tammy Triplett, PA-C  oxyCODONE-acetaminophen (PERCOCET/ROXICET) 5-325 MG per tablet Take 2 tablets by mouth every 4 (four) hours as needed for moderate pain or severe pain. 05/16/14   Hope Bunnie Pion, NP  predniSONE (STERAPRED UNI-PAK) 10 MG tablet Take by mouth daily. Take 6 tablets PO today and then 5, 4, 3, 2, 1 05/16/14   Hope Bunnie Pion, NP  traMADol (ULTRAM) 50 MG tablet Take 2 tablets (100 mg total) by mouth every 6 (six) hours as needed. 05/31/14   Rolland Porter, MD   Triage Vitals: BP 147/68 mmHg  Pulse 90  Temp(Src) 98.2 F (36.8 C) (Oral)  Resp 20  Ht 5\' 9"  (1.753 m)  Wt 170 lb (77.111 kg)  BMI 25.09 kg/m2  SpO2 99%  LMP 08/12/2010 Physical Exam  Constitutional: She appears well-developed and well-nourished.  HENT:  Head: Normocephalic.  Eyes: Conjunctivae are normal.  Neck: Normal range of motion. Neck supple.  Cardiovascular: Normal rate and intact distal pulses.   Pedal pulses normal.  Pulmonary/Chest: Effort normal.  Abdominal: Soft. Bowel sounds are normal. She exhibits no distension and no mass.  Musculoskeletal: Normal range of motion. She exhibits tenderness. She exhibits no edema.       Lumbar back: She exhibits tenderness. She exhibits no swelling, no edema and no spasm.  TTP to lumbar, midline and right paralumbar spine.  Neurological: She is alert. She has normal strength. She displays no atrophy and no tremor. No sensory deficit. Gait normal.  Reflex Scores:      Patellar reflexes are 2+ on the right side and 2+ on the left side.      Achilles reflexes are 2+ on the right side and 2+ on the left side. No strength deficit noted in hip and knee flexor and extensor muscle groups.  Ankle flexion and extension  intact.  Skin: Skin is warm and dry.  Psychiatric: She has a normal mood and affect.  Nursing note and vitals reviewed.   ED Course  Procedures (including critical care time) DIAGNOSTIC STUDIES: Oxygen Saturation is 99% on RA, normal by my interpretation.   COORDINATION OF CARE: 8:00 PM- Will prescribe pain medication and encourage pt to keep appt with Dr. Luna Glasgow. Pt verbalizes understanding and agrees to plan.  Medications  HYDROcodone-acetaminophen (NORCO/VICODIN) 5-325 MG per tablet 1 tablet (1 tablet Oral Given 10/03/14 2053)  ibuprofen (ADVIL,MOTRIN) tablet 800 mg (800 mg Oral Given 10/03/14 2053)    Labs Review Labs Reviewed - No data  to display  Imaging Review No results found.   EKG Interpretation None      MDM   Final diagnoses:  Sciatica, right    No neuro deficit on exam or by history to suggest emergent or surgical presentation.  Also discussed worsened sx that should prompt immediate re-evaluation including distal weakness, bowel/bladder retention/incontinence.  Pt prescribed short course of hydrocodone, ibuprofen. The patient appears reasonably screened and/or stabilized for discharge and I doubt any other medical condition or other [facility] requiring further screening, evaluation, or treatment in the ED at this time prior to discharge.  I personally performed the services described in this documentation, which was scribed in my presence. The recorded information has been reviewed and is accurate.    Evalee Jefferson, PA-C 10/05/14 [place], DO 10/05/14 [phone]

## 2014-11-12 ENCOUNTER — Emergency Department (HOSPITAL_COMMUNITY)
Admission: EM | Admit: 2014-11-12 | Discharge: 2014-11-12 | Disposition: A | Discharge status: Home / Self Care | Payer: Self-pay | Attending: Emergency Medicine | Admitting: Emergency Medicine

## 2014-11-12 ENCOUNTER — Encounter (HOSPITAL_COMMUNITY): Payer: Self-pay | Admitting: Emergency Medicine

## 2014-11-12 DIAGNOSIS — Z862 Personal history of diseases of the blood and blood-forming organs and certain disorders involving the immune mechanism: Secondary | ICD-10-CM | POA: Insufficient documentation

## 2014-11-12 DIAGNOSIS — Z880 Allergy status to penicillin: Secondary | ICD-10-CM | POA: Insufficient documentation

## 2014-11-12 DIAGNOSIS — I10 Essential (primary) hypertension: Secondary | ICD-10-CM | POA: Insufficient documentation

## 2014-11-12 DIAGNOSIS — Z79899 Other long term (current) drug therapy: Secondary | ICD-10-CM | POA: Insufficient documentation

## 2014-11-12 DIAGNOSIS — I889 Nonspecific lymphadenitis, unspecified: Secondary | ICD-10-CM | POA: Insufficient documentation

## 2014-11-12 DIAGNOSIS — Z720 Tobacco use: Secondary | ICD-10-CM | POA: Insufficient documentation

## 2014-11-12 DIAGNOSIS — E780 Pure hypercholesterolemia: Secondary | ICD-10-CM | POA: Insufficient documentation

## 2014-11-12 DIAGNOSIS — Z7952 Long term (current) use of systemic steroids: Secondary | ICD-10-CM | POA: Insufficient documentation

## 2014-11-12 MED ORDER — CLINDAMYCIN HCL 150 MG PO CAPS
300.0000 mg | ORAL_CAPSULE | Freq: Once | ORAL | Status: AC
  Administered 2014-11-12: 300 mg via ORAL
  Filled 2014-11-12: qty 2

## 2014-11-12 MED ORDER — CLINDAMYCIN HCL 150 MG PO CAPS: 150.0000 mg | ORAL_CAPSULE | Freq: Four times a day (QID) | ORAL | Status: DC

## 2014-11-12 NOTE — ED Notes (Signed)
Pco swollen painful area under her lt ear - radiating down into her throat - ? Ear

## 2014-11-12 NOTE — Discharge Instructions (Signed)
Cervical Adenitis You have a swollen lymph gland in your neck. This commonly happens with Strep and virus infections, dental problems, insect bites, and injuries about the face, scalp, or neck. The lymph glands swell as the body fights the infection or heals the injury. Swelling and firmness typically lasts for several weeks after the infection or injury is healed. Rarely lymph glands can become swollen because of cancer or TB. Antibiotics are prescribed if there is evidence of an infection. Sometimes an infected lymph gland becomes filled with pus. This condition may require opening up the abscessed gland by draining it surgically. Most of the time infected glands return to normal within two weeks. Do not poke or squeeze the swollen lymph nodes. That may keep them from shrinking back to their normal size. If the lymph gland is still swollen after 2 weeks, further medical evaluation is needed.  SEEK IMMEDIATE MEDICAL CARE IF:  You have difficulty swallowing or breathing, increased swelling, severe pain, or a high fever.  Document Released: 03/19/2005 Document Revised: 06/11/2011 Document Reviewed: 09/08/2006 [facility] Patient Information 2015 [place], [place]. This information is not intended to replace advice given to you by your health care provider. Make sure you discuss any questions you have with your health care provider.  Prescription for antibiotic. Warm moist heat.   If symptoms do not improve in 10-14 days, you will need follow-up.

## 2014-11-12 NOTE — ED Provider Notes (Signed)
CSN: [id number]     Arrival date & time 11/12/14  1151 History   First MD Initiated Contact with Patient 11/12/14 1209     Chief Complaint  Patient presents with  . Sore Throat     (Consider location/radiation/quality/duration/timing/severity/associated sxs/prior Treatment) HPI.... Tender puffy nodular area left superior anterior lateral neck area for 2 days. No fever, sweats, chills. No sore throat or earache. Patient smoker. Severity is mild. No radiation of pain  Past Medical History  Diagnosis Date  . Sickle cell trait   . Hypertension     does not take meds  . Blood dyscrasia     sickle cell trait  . Hypercholesterolemia    Past Surgical History  Procedure Laterality Date  . Appendectomy    . Incision and drainage perirectal abscess  12/21/09  . Cesarean section      x 2  . Proctoscopy  10/17/2010    Procedure: PROCTOSCOPY;  Surgeon: Scherry Ran;  Location: [facility];  Service: General;  Laterality: N/A;  Rigid Proctoscopy/Possible Fistula in Ano  Procedure ended at 1003  . Therapeutic abortion      x2  . Carpal tunnel release  03/23/2011    Procedure: CARPAL TUNNEL RELEASE;  Surgeon: Sanjuana Kava;  Location: [facility];  Service: Orthopedics;  Laterality: Left;  . Carpal tunnel release  05/10/2011    Procedure: CARPAL TUNNEL RELEASE;  Surgeon: Sanjuana Kava, MD;  Location: [facility];  Service: Orthopedics;  Laterality: Right;   Family History  Problem Relation Age of Onset  . Kidney failure Daughter   . Sickle cell anemia Daughter   . Sickle cell anemia Son   . Anesthesia problems Neg Hx   . Hypotension Neg Hx   . Malignant hyperthermia Neg Hx   . Pseudochol deficiency Neg Hx    Social History  Substance Use Topics  . Smoking status: Current Every Day Smoker -- 0.50 packs/day for 30 years    Types: Cigarettes  . Smokeless tobacco: None  . Alcohol Use: No   OB History    Gravida Para Term Preterm AB TAB SAB Ectopic Multiple Living   4 2 2  2     2      Review  of Systems  All other systems reviewed and are negative.     Allergies  Penicillins; Penicillins cross reactors; and Tape  Home Medications   Prior to Admission medications   Medication Sig Start Date End Date Taking? Authorizing Provider  clindamycin (CLEOCIN) 150 MG capsule Take 1 capsule (150 mg total) by mouth every 6 (six) hours. 11/12/14   Nat Christen, MD  HYDROcodone-acetaminophen (NORCO/VICODIN) 5-325 MG per tablet Take 1 tablet by mouth every 4 (four) hours as needed. 10/03/14   Evalee Jefferson, PA-C  ibuprofen (ADVIL,MOTRIN) 600 MG tablet Take 1 tablet (600 mg total) by mouth every 6 (six) hours as needed. 10/03/14   Evalee Jefferson, PA-C  lisinopril (PRINIVIL,ZESTRIL) 20 MG tablet Take 20 mg by mouth daily.    Historical Provider, MD  methocarbamol (ROBAXIN) 500 MG tablet Take 1 tablet (500 mg total) by mouth 3 (three) times daily. 09/18/14   Tammy Triplett, PA-C  Multiple Vitamin (MULTIVITAMIN WITH MINERALS) TABS Take 1 tablet by mouth daily.    Historical Provider, MD  mupirocin ointment (BACTROBAN) 2 % Apply to the affected area TID x 10 days 09/18/14   Tammy Triplett, PA-C  oxyCODONE-acetaminophen (PERCOCET/ROXICET) 5-325 MG per tablet Take 2 tablets by mouth every 4 (four) hours as needed for  moderate pain or severe pain. 05/16/14   Hope Bunnie Pion, NP  predniSONE (STERAPRED UNI-PAK) 10 MG tablet Take by mouth daily. Take 6 tablets PO today and then 5, 4, 3, 2, 1 05/16/14   Hope Bunnie Pion, NP  ranitidine (ZANTAC) 150 MG tablet Take 150 mg by mouth 2 (two) times daily.    Historical Provider, MD  simvastatin (ZOCOR) 10 MG tablet Take 10 mg by mouth daily.    Historical Provider, MD  traMADol (ULTRAM) 50 MG tablet Take 2 tablets (100 mg total) by mouth every 6 (six) hours as needed. 05/31/14   Rolland Porter, MD   BP 121/87 mmHg  Pulse 92  Temp(Src) 98.9 F (37.2 C) (Oral)  Ht 5' 8.5" (1.74 m)  Wt 172 lb (78.019 kg)  BMI 25.77 kg/m2  SpO2 100%  LMP 08/12/2010 Physical Exam  Constitutional: She is  oriented to person, place, and time. She appears well-developed and well-nourished.  HENT:  Head: Normocephalic and atraumatic.  Eyes: Conjunctivae and EOM are normal. Pupils are equal, round, and reactive to light.  Neck:  Tender 1.5 cm nodular area left superior anterior lateral neck region  Cardiovascular: Normal rate and regular rhythm.   Pulmonary/Chest: Effort normal and breath sounds normal.  Abdominal: Soft. Bowel sounds are normal.  Musculoskeletal: Normal range of motion.  Neurological: She is alert and oriented to person, place, and time.  Skin: Skin is warm and dry.  Psychiatric: She has a normal mood and affect. Her behavior is normal.  Nursing note and vitals reviewed.   ED Course  Procedures (including critical care time) Labs Review Labs Reviewed - No data to display  Imaging Review No results found. I, Reason Helzer, personally reviewed and evaluated these images and lab results as part of my medical decision-making.   EKG Interpretation None      MDM   Final diagnoses:  Adenitis    Patient is nontoxic-appearing. She is allergic to penicillin. Rx clindamycin. She has been instructed to return if nodular area continues to bother her. Possibility of cancer discussed.    Nat Christen, MD 11/12/14 [phone]

## 2014-11-15 ENCOUNTER — Encounter (HOSPITAL_COMMUNITY): Payer: Self-pay | Admitting: Emergency Medicine

## 2014-11-15 ENCOUNTER — Emergency Department (HOSPITAL_COMMUNITY)
Admission: EM | Admit: 2014-11-15 | Discharge: 2014-11-16 | Disposition: A | Discharge status: Home / Self Care | Payer: Self-pay | Attending: Emergency Medicine | Admitting: Emergency Medicine

## 2014-11-15 DIAGNOSIS — H748X2 Other specified disorders of left middle ear and mastoid: Secondary | ICD-10-CM | POA: Insufficient documentation

## 2014-11-15 DIAGNOSIS — I10 Essential (primary) hypertension: Secondary | ICD-10-CM | POA: Insufficient documentation

## 2014-11-15 DIAGNOSIS — Z880 Allergy status to penicillin: Secondary | ICD-10-CM | POA: Insufficient documentation

## 2014-11-15 DIAGNOSIS — Z720 Tobacco use: Secondary | ICD-10-CM | POA: Insufficient documentation

## 2014-11-15 DIAGNOSIS — H6592 Unspecified nonsuppurative otitis media, left ear: Secondary | ICD-10-CM

## 2014-11-15 DIAGNOSIS — Z862 Personal history of diseases of the blood and blood-forming organs and certain disorders involving the immune mechanism: Secondary | ICD-10-CM | POA: Insufficient documentation

## 2014-11-15 DIAGNOSIS — Z79899 Other long term (current) drug therapy: Secondary | ICD-10-CM | POA: Insufficient documentation

## 2014-11-15 DIAGNOSIS — E780 Pure hypercholesterolemia: Secondary | ICD-10-CM | POA: Insufficient documentation

## 2014-11-15 DIAGNOSIS — I889 Nonspecific lymphadenitis, unspecified: Secondary | ICD-10-CM | POA: Insufficient documentation

## 2014-11-15 NOTE — ED Provider Notes (Signed)
CSN: [id number]     Arrival date & time 11/15/14  2331 History  This chart was scribed for Amber Hacker, MD by Helane Gunther, ED Scribe. This patient was seen in room APA12/APA12 and the patient's care was started at 12:01 AM.     Chief Complaint  Patient presents with  . Otalgia   The history is provided by the patient. No language interpreter was used.   HPI Comments: Amber Drake is a 54 y.o. female who presents to the Emergency Department complaining of constant, unchanged left ear pain onset 5 days ago. She currently rates it as a 7/10. She states that her ear hurt as well as the left side of her neck. She has been to the ED for the same when she was prescribed antibiotics (Clindamycin) which have not provided any relief. At that time she was diagnosed with adenitis and per chart review, she did not endorse your pain at that time. Pt denies drainage, sore throat, and fever.  She's not taken anything at home for the pain. She states that she was told to come back if pain did not improve. She is allergic to penicillin.  Past Medical History  Diagnosis Date  . Sickle cell trait   . Hypertension     does not take meds  . Blood dyscrasia     sickle cell trait  . Hypercholesterolemia    Past Surgical History  Procedure Laterality Date  . Appendectomy    . Incision and drainage perirectal abscess  12/21/09  . Cesarean section      x 2  . Proctoscopy  10/17/2010    Procedure: PROCTOSCOPY;  Surgeon: Scherry Ran;  Location: [facility];  Service: General;  Laterality: N/A;  Rigid Proctoscopy/Possible Fistula in Ano  Procedure ended at 1003  . Therapeutic abortion      x2  . Carpal tunnel release  03/23/2011    Procedure: CARPAL TUNNEL RELEASE;  Surgeon: Sanjuana Kava;  Location: [facility];  Service: Orthopedics;  Laterality: Left;  . Carpal tunnel release  05/10/2011    Procedure: CARPAL TUNNEL RELEASE;  Surgeon: Sanjuana Kava, MD;  Location: [facility];  Service: Orthopedics;   Laterality: Right;   Family History  Problem Relation Age of Onset  . Kidney failure Daughter   . Sickle cell anemia Daughter   . Sickle cell anemia Son   . Anesthesia problems Neg Hx   . Hypotension Neg Hx   . Malignant hyperthermia Neg Hx   . Pseudochol deficiency Neg Hx    Social History  Substance Use Topics  . Smoking status: Current Every Day Smoker -- 0.50 packs/day for 30 years    Types: Cigarettes  . Smokeless tobacco: None  . Alcohol Use: No   OB History    Gravida Para Term Preterm AB TAB SAB Ectopic Multiple Living   4 2 2  2     2      Review of Systems  Constitutional: Negative for fever.  HENT: Positive for ear pain. Negative for congestion, ear discharge, facial swelling, sore throat, trouble swallowing and voice change.   Respiratory: Negative for cough, chest tightness and shortness of breath.   Cardiovascular: Negative for chest pain.  Gastrointestinal: Negative for abdominal pain.  Neurological: Negative for headaches.  All other systems reviewed and are negative.   Allergies  Penicillins; Penicillins cross reactors; and Tape  Home Medications   Prior to Admission medications   Medication Sig Start Date End Date Taking? Authorizing Provider  azithromycin (ZITHROMAX) 250 MG tablet Take 1 tablet (250 mg total) by mouth daily. Take first 2 tablets together, then 1 every day until finished. 11/16/14   Amber Hacker, MD  fluticasone (FLONASE) 50 MCG/ACT nasal spray Place 2 sprays into both nostrils daily. 11/16/14   Amber Hacker, MD  HYDROcodone-acetaminophen (NORCO/VICODIN) 5-325 MG per tablet Take 1 tablet by mouth every 4 (four) hours as needed. 10/03/14   Evalee Jefferson, PA-C  ibuprofen (ADVIL,MOTRIN) 600 MG tablet Take 1 tablet (600 mg total) by mouth every 6 (six) hours as needed. 10/03/14   Evalee Jefferson, PA-C  lisinopril (PRINIVIL,ZESTRIL) 20 MG tablet Take 20 mg by mouth daily.    Historical Provider, MD  methocarbamol (ROBAXIN) 500 MG tablet Take 1  tablet (500 mg total) by mouth 3 (three) times daily. 09/18/14   Tammy Triplett, PA-C  Multiple Vitamin (MULTIVITAMIN WITH MINERALS) TABS Take 1 tablet by mouth daily.    Historical Provider, MD  mupirocin ointment (BACTROBAN) 2 % Apply to the affected area TID x 10 days 09/18/14   Tammy Triplett, PA-C  oxyCODONE-acetaminophen (PERCOCET/ROXICET) 5-325 MG per tablet Take 2 tablets by mouth every 4 (four) hours as needed for moderate pain or severe pain. 05/16/14   Hope Bunnie Pion, NP  predniSONE (STERAPRED UNI-PAK) 10 MG tablet Take by mouth daily. Take 6 tablets PO today and then 5, 4, 3, 2, 1 05/16/14   Hope Bunnie Pion, NP  ranitidine (ZANTAC) 150 MG tablet Take 150 mg by mouth 2 (two) times daily.    Historical Provider, MD  simvastatin (ZOCOR) 10 MG tablet Take 10 mg by mouth daily.    Historical Provider, MD  sodium chloride (OCEAN) 0.65 % SOLN nasal spray Place 1 spray into both nostrils as needed for congestion. 11/16/14   Amber Hacker, MD  traMADol (ULTRAM) 50 MG tablet Take 2 tablets (100 mg total) by mouth every 6 (six) hours as needed. 05/31/14   Rolland Porter, MD   BP 133/90 mmHg  Pulse 80  Temp(Src) 98.9 F (37.2 C)  Resp 18  Ht 5\' 4"  (1.626 m)  Wt 174 lb (78.926 kg)  BMI 29.85 kg/m2  SpO2 100%  LMP 08/12/2010 Physical Exam  Constitutional: She is oriented to person, place, and time. She appears well-developed and well-nourished.  HENT:  Head: Normocephalic and atraumatic.  Mouth/Throat: Oropharynx is clear and moist. No oropharyngeal exudate.  Effusion noted behind left TM with dull light reflex, no significant erythema, right TM normal Multiple missing teeth, no obvious dental infection noted, no trismus, uvula midline  Eyes: Pupils are equal, round, and reactive to light.  Neck: Neck supple.  Left anterior cervical chain adenopathy and mobile tender left submandibular lymph node  Cardiovascular: Normal rate, regular rhythm and normal heart sounds.   Pulmonary/Chest: Effort normal.  No respiratory distress. She has no wheezes.  Lymphadenopathy:    She has cervical adenopathy.  Neurological: She is alert and oriented to person, place, and time.  Skin: Skin is warm and dry.  Psychiatric: She has a normal mood and affect.  Nursing note and vitals reviewed.   ED Course  Procedures  DIAGNOSTIC STUDIES: Oxygen Saturation is 100% on RA, normal by my interpretation.    COORDINATION OF CARE: 12:04 AM - Discussed plans to change antibiotics and order nasal sprays. Pt advised of plan for treatment and pt agrees.  Labs Review Labs Reviewed - No data to display  Imaging Review No results found. I, Jaylynne Birkhead F, personally reviewed  and evaluated these images and lab results as part of my medical decision-making.   EKG Interpretation None      MDM   Final diagnoses:  Middle ear effusion, left  Lymphadenitis    Patient presents with persistent left ear pain. Recently diagnosed with adenitis and started on clindamycin. No documentation of middle ear effusion at that time. Given evidence of middle ear effusion on this exam, patient likely has early otitis media.  She is nontoxic and the utility of antibiotics at this time is questionable; however, given that she is on clindamycin, will switch to azithromycin given her penicillin allergy to cover better for otitis media. Will also start the patient on Flonase and nasal saline.  Patient was given strict return precautions.  After history, exam, and medical workup I feel the patient has been appropriately medically screened and is safe for discharge home. Pertinent diagnoses were discussed with the patient. Patient was given return precautions.  I personally performed the services described in this documentation, which was scribed in my presence. The recorded information has been reviewed and is accurate.   Amber Hacker, MD 11/16/14 [phone]

## 2014-11-15 NOTE — ED Notes (Signed)
Pt c/o left ear since Friday and pt was seen here for the same.

## 2014-11-16 MED ORDER — FLUTICASONE PROPIONATE 50 MCG/ACT NA SUSP
2.0000 | Freq: Every day | NASAL | Status: DC
  Administered 2014-11-16: 2 via NASAL
  Filled 2014-11-16: qty 16

## 2014-11-16 MED ORDER — FLUTICASONE PROPIONATE 50 MCG/ACT NA SUSP
NASAL | Status: AC
  Filled 2014-11-16: qty 16

## 2014-11-16 MED ORDER — FLUTICASONE PROPIONATE 50 MCG/ACT NA SUSP: 2.0000 | Freq: Every day | NASAL | Status: DC

## 2014-11-16 MED ORDER — SALINE SPRAY 0.65 % NA SOLN
1.0000 | NASAL | Status: DC | PRN
  Administered 2014-11-16: 1 via NASAL
  Filled 2014-11-16: qty 44

## 2014-11-16 MED ORDER — SALINE SPRAY 0.65 % NA SOLN
NASAL | Status: AC
  Filled 2014-11-16: qty 44

## 2014-11-16 MED ORDER — SALINE SPRAY 0.65 % NA SOLN: 1.0000 | NASAL | Status: DC | PRN

## 2014-11-16 MED ORDER — AZITHROMYCIN 250 MG PO TABS: 250.0000 mg | ORAL_TABLET | Freq: Every day | ORAL | Status: DC

## 2014-11-16 NOTE — Discharge Instructions (Signed)
It appears that you have an effusion behind her left ear.  This can be due to infection, allergy, or virus. Your antibiotics will be changed. Discontinued taking clindamycin. You should also start nasal saline and Flonase to clear out the eustachian tube. If you develop fever or any new or worsening symptoms she should be reevaluated by her primary physician.  Otitis Media With Effusion Otitis media with effusion is the presence of fluid in the middle ear. This is a common problem in children, which often follows ear infections. It may be present for weeks or longer after the infection. Unlike an acute ear infection, otitis media with effusion refers only to fluid behind the ear drum and not infection. Children with repeated ear and sinus infections and allergy problems are the most likely to get otitis media with effusion. CAUSES  The most frequent cause of the fluid buildup is dysfunction of the eustachian tubes. These are the tubes that drain fluid in the ears to the back of the nose (nasopharynx). SYMPTOMS   The main symptom of this condition is hearing loss. As a result, you or your child may:  Listen to the TV at a loud volume.  Not respond to questions.  Ask "what" often when spoken to.  Mistake or confuse one sound or word for another.  There may be a sensation of fullness or pressure but usually not pain. DIAGNOSIS   Your health care provider will diagnose this condition by examining you or your child's ears.  Your health care provider may test the pressure in you or your child's ear with a tympanometer.  A hearing test may be conducted if the problem persists. TREATMENT   Treatment depends on the duration and the effects of the effusion.  Antibiotics, decongestants, nose drops, and cortisone-type drugs (tablets or nasal spray) may not be helpful.  Children with persistent ear effusions may have delayed language or behavioral problems. Children at risk for developmental  delays in hearing, learning, and speech may require referral to a specialist earlier than children not at risk.  You or your child's health care provider may suggest a referral to an ear, nose, and throat surgeon for treatment. The following may help restore normal hearing:  Drainage of fluid.  Placement of ear tubes (tympanostomy tubes).  Removal of adenoids (adenoidectomy). HOME CARE INSTRUCTIONS   Avoid secondhand smoke.  Infants who are breastfed are less likely to have this condition.  Avoid feeding infants while they are lying flat.  Avoid known environmental allergens.  Avoid people who are sick. SEEK MEDICAL CARE IF:   Hearing is not better in 3 months.  Hearing is worse.  Ear pain.  Drainage from the ear.  Dizziness. MAKE SURE YOU:   Understand these instructions.  Will watch your condition.  Will get help right away if you are not doing well or get worse. Document Released: 04/26/2004 Document Revised: 08/03/2013 Document Reviewed: 10/14/2012 [facility] Patient Information 2015 [place], [place]. This information is not intended to replace advice given to you by your health care provider. Make sure you discuss any questions you have with your health care provider.

## 2014-12-30 ENCOUNTER — Emergency Department (HOSPITAL_COMMUNITY)
Admission: EM | Admit: 2014-12-30 | Discharge: 2014-12-31 | Disposition: A | Discharge status: Home / Self Care | Payer: Self-pay | Attending: Emergency Medicine | Admitting: Emergency Medicine

## 2014-12-30 ENCOUNTER — Encounter (HOSPITAL_COMMUNITY): Payer: Self-pay

## 2014-12-30 DIAGNOSIS — S299XXA Unspecified injury of thorax, initial encounter: Secondary | ICD-10-CM | POA: Insufficient documentation

## 2014-12-30 DIAGNOSIS — Z7952 Long term (current) use of systemic steroids: Secondary | ICD-10-CM | POA: Insufficient documentation

## 2014-12-30 DIAGNOSIS — E780 Pure hypercholesterolemia: Secondary | ICD-10-CM | POA: Insufficient documentation

## 2014-12-30 DIAGNOSIS — Z862 Personal history of diseases of the blood and blood-forming organs and certain disorders involving the immune mechanism: Secondary | ICD-10-CM | POA: Insufficient documentation

## 2014-12-30 DIAGNOSIS — Y92410 Unspecified street and highway as the place of occurrence of the external cause: Secondary | ICD-10-CM | POA: Insufficient documentation

## 2014-12-30 DIAGNOSIS — Y9389 Activity, other specified: Secondary | ICD-10-CM | POA: Insufficient documentation

## 2014-12-30 DIAGNOSIS — Z880 Allergy status to penicillin: Secondary | ICD-10-CM | POA: Insufficient documentation

## 2014-12-30 DIAGNOSIS — S39012A Strain of muscle, fascia and tendon of lower back, initial encounter: Secondary | ICD-10-CM | POA: Insufficient documentation

## 2014-12-30 DIAGNOSIS — Z79899 Other long term (current) drug therapy: Secondary | ICD-10-CM | POA: Insufficient documentation

## 2014-12-30 DIAGNOSIS — Z720 Tobacco use: Secondary | ICD-10-CM | POA: Insufficient documentation

## 2014-12-30 DIAGNOSIS — S161XXA Strain of muscle, fascia and tendon at neck level, initial encounter: Secondary | ICD-10-CM | POA: Insufficient documentation

## 2014-12-30 DIAGNOSIS — I10 Essential (primary) hypertension: Secondary | ICD-10-CM | POA: Insufficient documentation

## 2014-12-30 DIAGNOSIS — Z7951 Long term (current) use of inhaled steroids: Secondary | ICD-10-CM | POA: Insufficient documentation

## 2014-12-30 DIAGNOSIS — Y998 Other external cause status: Secondary | ICD-10-CM | POA: Insufficient documentation
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
## 2014-12-31 ENCOUNTER — Emergency Department (HOSPITAL_COMMUNITY): Payer: Self-pay
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
## 2014-12-31 MED ORDER — NAPROXEN 250 MG PO TABS
500.0000 mg | ORAL_TABLET | Freq: Once | ORAL | Status: AC
  Administered 2014-12-31: 500 mg via ORAL
  Filled 2014-12-31: qty 2

## 2014-12-31 MED ORDER — NAPROXEN 500 MG PO TABS: 500.0000 mg | ORAL_TABLET | Freq: Two times a day (BID) | ORAL | Status: DC

## 2014-12-31 MED ORDER — CYCLOBENZAPRINE HCL 10 MG PO TABS: 10.0000 mg | ORAL_TABLET | Freq: Three times a day (TID) | ORAL | Status: DC | PRN

## 2014-12-31 MED ORDER — CYCLOBENZAPRINE HCL 10 MG PO TABS
10.0000 mg | ORAL_TABLET | Freq: Once | ORAL | Status: AC
  Administered 2014-12-31: 10 mg via ORAL
  Filled 2014-12-31: qty 1

## 2014-12-31 NOTE — ED Notes (Signed)
Pt ambulated to restroom & returned to room w/ no complications. 
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
## 2014-12-31 NOTE — ED Notes (Signed)
Pt alert & oriented x4, stable gait. Patient given discharge instructions, paperwork & prescription(s). Patient  instructed to stop at the registration desk to finish any additional paperwork. Patient verbalized understanding. Pt left department w/ no further questions. 

## 2015-01-03 ENCOUNTER — Telehealth: Payer: Self-pay | Admitting: Orthopedic Surgery

## 2015-01-03 ENCOUNTER — Encounter (HOSPITAL_COMMUNITY): Payer: Self-pay | Admitting: Emergency Medicine

## 2015-01-03 ENCOUNTER — Emergency Department (HOSPITAL_COMMUNITY)
Admission: EM | Admit: 2015-01-03 | Discharge: 2015-01-03 | Disposition: A | Discharge status: Home / Self Care | Payer: Self-pay | Attending: Emergency Medicine | Admitting: Emergency Medicine

## 2015-01-03 DIAGNOSIS — Z791 Long term (current) use of non-steroidal anti-inflammatories (NSAID): Secondary | ICD-10-CM | POA: Insufficient documentation

## 2015-01-03 DIAGNOSIS — Z880 Allergy status to penicillin: Secondary | ICD-10-CM | POA: Insufficient documentation

## 2015-01-03 DIAGNOSIS — Z7951 Long term (current) use of inhaled steroids: Secondary | ICD-10-CM | POA: Insufficient documentation

## 2015-01-03 DIAGNOSIS — Z7952 Long term (current) use of systemic steroids: Secondary | ICD-10-CM | POA: Insufficient documentation

## 2015-01-03 DIAGNOSIS — M25551 Pain in right hip: Secondary | ICD-10-CM | POA: Insufficient documentation

## 2015-01-03 DIAGNOSIS — Z720 Tobacco use: Secondary | ICD-10-CM | POA: Insufficient documentation

## 2015-01-03 DIAGNOSIS — R079 Chest pain, unspecified: Secondary | ICD-10-CM | POA: Insufficient documentation

## 2015-01-03 DIAGNOSIS — M7918 Myalgia, other site: Secondary | ICD-10-CM

## 2015-01-03 DIAGNOSIS — M79672 Pain in left foot: Secondary | ICD-10-CM | POA: Insufficient documentation

## 2015-01-03 DIAGNOSIS — M542 Cervicalgia: Secondary | ICD-10-CM | POA: Insufficient documentation

## 2015-01-03 DIAGNOSIS — I10 Essential (primary) hypertension: Secondary | ICD-10-CM | POA: Insufficient documentation

## 2015-01-03 DIAGNOSIS — Z862 Personal history of diseases of the blood and blood-forming organs and certain disorders involving the immune mechanism: Secondary | ICD-10-CM | POA: Insufficient documentation

## 2015-01-03 DIAGNOSIS — E7800 Pure hypercholesterolemia, unspecified: Secondary | ICD-10-CM | POA: Insufficient documentation

## 2015-01-03 DIAGNOSIS — M25511 Pain in right shoulder: Secondary | ICD-10-CM | POA: Insufficient documentation

## 2015-01-03 MED ORDER — KETOROLAC TROMETHAMINE 10 MG PO TABS
10.0000 mg | ORAL_TABLET | Freq: Once | ORAL | Status: AC
  Administered 2015-01-03: 10 mg via ORAL
  Filled 2015-01-03: qty 1

## 2015-01-03 MED ORDER — DEXAMETHASONE 4 MG PO TABS: 4.0000 mg | ORAL_TABLET | Freq: Two times a day (BID) | ORAL | Status: DC

## 2015-01-03 MED ORDER — PREDNISONE 50 MG PO TABS
60.0000 mg | ORAL_TABLET | Freq: Once | ORAL | Status: AC
  Administered 2015-01-03: 60 mg via ORAL
  Filled 2015-01-03 (×2): qty 1
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
## 2015-01-03 NOTE — ED Notes (Signed)
Ambulatory to restroom without difficulty.

## 2015-01-08 ENCOUNTER — Encounter (HOSPITAL_COMMUNITY): Payer: Self-pay | Admitting: Emergency Medicine

## 2015-01-08 ENCOUNTER — Emergency Department (HOSPITAL_COMMUNITY)
Admission: EM | Admit: 2015-01-08 | Discharge: 2015-01-08 | Disposition: A | Discharge status: Home / Self Care | Payer: Self-pay | Attending: Emergency Medicine | Admitting: Emergency Medicine

## 2015-01-08 DIAGNOSIS — Z720 Tobacco use: Secondary | ICD-10-CM | POA: Insufficient documentation

## 2015-01-08 DIAGNOSIS — Z880 Allergy status to penicillin: Secondary | ICD-10-CM | POA: Insufficient documentation

## 2015-01-08 DIAGNOSIS — E7800 Pure hypercholesterolemia, unspecified: Secondary | ICD-10-CM | POA: Insufficient documentation

## 2015-01-08 DIAGNOSIS — Z79899 Other long term (current) drug therapy: Secondary | ICD-10-CM | POA: Insufficient documentation

## 2015-01-08 DIAGNOSIS — Z7952 Long term (current) use of systemic steroids: Secondary | ICD-10-CM | POA: Insufficient documentation

## 2015-01-08 DIAGNOSIS — Z862 Personal history of diseases of the blood and blood-forming organs and certain disorders involving the immune mechanism: Secondary | ICD-10-CM | POA: Insufficient documentation

## 2015-01-08 DIAGNOSIS — IMO0002 Reserved for concepts with insufficient information to code with codable children: Secondary | ICD-10-CM

## 2015-01-08 DIAGNOSIS — I10 Essential (primary) hypertension: Secondary | ICD-10-CM | POA: Insufficient documentation

## 2015-01-08 DIAGNOSIS — H8110 Benign paroxysmal vertigo, unspecified ear: Secondary | ICD-10-CM | POA: Insufficient documentation

## 2015-01-08 LAB — BASIC METABOLIC PANEL
Anion gap: 6 (ref 5–15)
BUN: 16 mg/dL (ref 6–20)
CALCIUM: 9 mg/dL (ref 8.9–10.3)
CO2: 27 mmol/L (ref 22–32)
CREATININE: 1.05 mg/dL — AB (ref 0.44–1.00)
Chloride: 103 mmol/L (ref 101–111)
GFR calc Af Amer: >60 mL/min (ref >60)
GFR, EST NON AFRICAN AMERICAN: 60 mL/min — AB (ref >60)
Glucose, Bld: 107 mg/dL — ABNORMAL HIGH (ref 65–99)
Potassium: 3.7 mmol/L (ref 3.5–5.1)
SODIUM: 136 mmol/L (ref 135–145)

## 2015-01-08 LAB — CBC WITH DIFFERENTIAL/PLATELET
BASOS ABS: 0 10*3/uL (ref 0.0–0.1)
Basophils Relative: 0 %
EOS ABS: 0.1 10*3/uL (ref 0.0–0.7)
EOS PCT: 1 %
HCT: 35.2 % — ABNORMAL LOW (ref 36.0–46.0)
Hemoglobin: 12 g/dL (ref 12.0–15.0)
Lymphocytes Relative: 39 %
Lymphs Abs: 2.2 10*3/uL (ref 0.7–4.0)
MCH: 29.1 pg (ref 26.0–34.0)
MCHC: 34.1 g/dL (ref 30.0–36.0)
MCV: 85.4 fL (ref 78.0–100.0)
Monocytes Absolute: 0.4 10*3/uL (ref 0.1–1.0)
Monocytes Relative: 6 %
Neutro Abs: 3.1 10*3/uL (ref 1.7–7.7)
Neutrophils Relative %: 54 %
PLATELETS: 267 10*3/uL (ref 150–400)
RBC: 4.12 MIL/uL (ref 3.87–5.11)
RDW: 12.7 % (ref 11.5–15.5)
WBC: 5.7 10*3/uL (ref 4.0–10.5)

## 2015-01-08 LAB — URINALYSIS, ROUTINE W REFLEX MICROSCOPIC
BILIRUBIN URINE: NEGATIVE
GLUCOSE, UA: NEGATIVE mg/dL
Ketones, ur: NEGATIVE mg/dL
LEUKOCYTES UA: NEGATIVE
NITRITE: NEGATIVE
PROTEIN: NEGATIVE mg/dL
Specific Gravity, Urine: 1.01 (ref 1.005–1.030)
Urobilinogen, UA: 0.2 mg/dL (ref 0.0–1.0)
pH: 6 (ref 5.0–8.0)

## 2015-01-08 LAB — URINE MICROSCOPIC-ADD ON

## 2015-01-08 MED ORDER — MECLIZINE HCL 12.5 MG PO TABS
25.0000 mg | ORAL_TABLET | Freq: Once | ORAL | Status: AC
Start: 2015-01-08 — End: 2015-01-08
  Administered 2015-01-08: 25 mg via ORAL
  Filled 2015-01-08: qty 2

## 2015-01-08 MED ORDER — MECLIZINE HCL 25 MG PO TABS: 25.0000 mg | ORAL_TABLET | Freq: Three times a day (TID) | ORAL | Status: DC | PRN

## 2015-01-08 NOTE — ED Provider Notes (Signed)
CSN: [id number]     Arrival date & time 01/08/15  0804 History   First MD Initiated Contact with Patient 01/08/15 [phone]     Chief Complaint  Patient presents with  . Dizziness     (Consider location/radiation/quality/duration/timing/severity/associated sxs/prior Treatment) HPI   Amber Drake is a 54 y.o. female who presents to the Emergency Department complaining of intermittent dizziness for one week.  She reports feeling "light-headed and woozy" with position change.  Episodes are brief and occur few times per day.  She has not tried any therapies or medications.  She denies new medications or recent dose changes, pain, visual changes, headaches, nausea/vomiting, CP, fever, numbness or weakness, or palpitations.     Past Medical History  Diagnosis Date  . Sickle cell trait ([place])   . Hypertension     does not take meds  . Blood dyscrasia     sickle cell trait  . Hypercholesterolemia    Past Surgical History  Procedure Laterality Date  . Appendectomy    . Incision and drainage perirectal abscess  12/21/09  . Cesarean section      x 2  . Proctoscopy  10/17/2010    Procedure: PROCTOSCOPY;  Surgeon: Scherry Ran;  Location: [facility];  Service: General;  Laterality: N/A;  Rigid Proctoscopy/Possible Fistula in Ano  Procedure ended at 1003  . Therapeutic abortion      x2  . Carpal tunnel release  03/23/2011    Procedure: CARPAL TUNNEL RELEASE;  Surgeon: Sanjuana Kava;  Location: [facility];  Service: Orthopedics;  Laterality: Left;  . Carpal tunnel release  05/10/2011    Procedure: CARPAL TUNNEL RELEASE;  Surgeon: Sanjuana Kava, MD;  Location: [facility];  Service: Orthopedics;  Laterality: Right;   Family History  Problem Relation Age of Onset  . Kidney failure Daughter   . Sickle cell anemia Daughter   . Sickle cell anemia Son   . Anesthesia problems Neg Hx   . Hypotension Neg Hx   . Malignant hyperthermia Neg Hx   . Pseudochol deficiency Neg Hx    Social History  Substance  Use Topics  . Smoking status: Current Every Day Smoker -- 0.50 packs/day for 30 years    Types: Cigarettes  . Smokeless tobacco: None  . Alcohol Use: No   OB History    Gravida Para Term Preterm AB TAB SAB Ectopic Multiple Living   4 2 2  2     2      Review of Systems  Constitutional: Negative for fever, activity change and appetite change.  HENT: Negative for congestion, sinus pressure, sore throat and trouble swallowing.   Respiratory: Negative for chest tightness and shortness of breath.   Cardiovascular: Negative for chest pain and palpitations.  Gastrointestinal: Negative for nausea, vomiting and abdominal pain.  Genitourinary: Negative for dysuria.  Musculoskeletal: Negative for neck pain and neck stiffness.  Neurological: Positive for dizziness. Negative for seizures, syncope, speech difficulty, weakness, numbness and headaches.  Psychiatric/Behavioral: Negative for confusion. The patient is not nervous/anxious.   All other systems reviewed and are negative.     Allergies  Penicillins; Penicillins cross reactors; and Tape  Home Medications   Prior to Admission medications   Medication Sig Start Date End Date Taking? Authorizing Provider  azithromycin (ZITHROMAX) 250 MG tablet Take 1 tablet (250 mg total) by mouth daily. Take first 2 tablets together, then 1 every day until finished. Patient not taking: Reported on 01/03/2015 11/16/14   [name], MD  cyclobenzaprine (FLEXERIL) 10 MG tablet Take 1 tablet (10 mg total) by mouth 3 (three) times daily as needed. 12/31/14   Dorie Ohms, PA-C  dexamethasone (DECADRON) 4 MG tablet Take 1 tablet (4 mg total) by mouth 2 (two) times daily with a meal. 01/03/15   Lily Kocher, PA-C  fluticasone (FLONASE) 50 MCG/ACT nasal spray Place 2 sprays into both nostrils daily. Patient taking differently: Place 2 sprays into both nostrils daily as needed for allergies.  11/16/14   [name], MD  HYDROcodone-acetaminophen  (NORCO/VICODIN) 5-325 MG per tablet Take 1 tablet by mouth every 4 (four) hours as needed. 10/03/14   Evalee Jefferson, PA-C  ibuprofen (ADVIL,MOTRIN) 600 MG tablet Take 1 tablet (600 mg total) by mouth every 6 (six) hours as needed. 10/03/14   Evalee Jefferson, PA-C  lisinopril-hydrochlorothiazide (PRINZIDE,ZESTORETIC) 20-12.5 MG tablet Take 1 tablet by mouth daily. 12/15/14   Historical Provider, MD  methocarbamol (ROBAXIN) 500 MG tablet Take 1 tablet (500 mg total) by mouth 3 (three) times daily. Patient not taking: Reported on 01/03/2015 09/18/14   Geanine Vandekamp, PA-C  Multiple Vitamin (MULTIVITAMIN WITH MINERALS) TABS Take 1 tablet by mouth daily.    Historical Provider, MD  mupirocin ointment (BACTROBAN) 2 % Apply to the affected area TID x 10 days Patient not taking: Reported on 01/03/2015 09/18/14   Alizay Bronkema, PA-C  naproxen (NAPROSYN) 500 MG tablet Take 1 tablet (500 mg total) by mouth 2 (two) times daily with a meal. Patient not taking: Reported on 01/03/2015 12/31/14   Acasia Skilton, PA-C  ranitidine (ZANTAC) 150 MG tablet Take 150 mg by mouth 2 (two) times daily.    Historical Provider, MD  simvastatin (ZOCOR) 10 MG tablet Take 10 mg by mouth daily.    Historical Provider, MD  sodium chloride (OCEAN) 0.65 % SOLN nasal spray Place 1 spray into both nostrils as needed for congestion. 11/16/14   [name], MD   BP 119/75 mmHg  Pulse 77  Temp(Src) 98 F (36.7 C)  Resp 18  Ht 5\' 9"  (1.753 m)  Wt 176 lb (79.833 kg)  BMI 25.98 kg/m2  SpO2 100%  LMP 08/12/2010 Physical Exam  Constitutional: She is oriented to person, place, and time. She appears well-developed and well-nourished. No distress.  HENT:  Head: Normocephalic and atraumatic.  Right Ear: Tympanic membrane and ear canal normal.  Left Ear: Tympanic membrane and ear canal normal.  Mouth/Throat: Oropharynx is clear and moist.  Eyes: Conjunctivae and EOM are normal. Pupils are equal, round, and reactive to light.  Neck: Normal range  of motion. Neck supple. No thyromegaly present.  Cardiovascular: Normal rate, regular rhythm, normal heart sounds and intact distal pulses.   No murmur heard. Pulmonary/Chest: Effort normal and breath sounds normal. No respiratory distress. She exhibits no tenderness.  Abdominal: Soft. She exhibits no distension. There is no tenderness. There is no rebound.  Musculoskeletal: Normal range of motion.  Lymphadenopathy:    She has no cervical adenopathy.  Neurological: She is alert and oriented to person, place, and time. She has normal strength. No sensory deficit. She exhibits normal muscle tone. Coordination and gait normal.  Reflex Scores:      Tricep reflexes are 2+ on the right side and 2+ on the left side.      Bicep reflexes are 2+ on the right side and 2+ on the left side.      Patellar reflexes are 2+ on the right side and 2+ on the left side.  Achilles reflexes are 2+ on the right side and 2+ on the left side. Skin: Skin is warm and dry.  Psychiatric: She has a normal mood and affect. Thought content normal.  Nursing note and vitals reviewed.   ED Course  Procedures (including critical care time) Labs Review Labs Reviewed  BASIC METABOLIC PANEL - Abnormal; Notable for the following:    Glucose, Bld 107 (*)    Creatinine, Ser 1.05 (*)    GFR calc non Af Amer 60 (*)    All other components within normal limits  CBC WITH DIFFERENTIAL/PLATELET - Abnormal; Notable for the following:    HCT 35.2 (*)    All other components within normal limits  URINALYSIS, ROUTINE W REFLEX MICROSCOPIC (NOT AT [facility]) - Abnormal; Notable for the following:    Hgb urine dipstick LARGE (*)    All other components within normal limits  URINE MICROSCOPIC-ADD ON - Abnormal; Notable for the following:    Squamous Epithelial / LPF MANY (*)    All other components within normal limits    Imaging Review No results found. I have personally reviewed and evaluated these images and lab results as part  of my medical decision-making.   EKG Interpretation   Date/Time:  Saturday January 08 2015 09:52:13 EDT Ventricular Rate:  66 PR Interval:  194 QRS Duration: 89 QT Interval:  401 QTC Calculation: 420 R Axis:   62 Text Interpretation:  Sinus rhythm Anteroseptal infarct, old Confirmed by  ZAMMIT  MD, JOSEPH [phone]) on 01/08/2015 9:57:18 AM      MDM   Final diagnoses:  Positional vertigo, unspecified laterality    Orthostatic VS for the past 24 hrs (Last 3 readings):  BP- Lying Pulse- Lying BP- Sitting Pulse- Sitting BP- Standing at 0 minutes Pulse- Standing at 0 minutes  01/08/15 0841 111/74 mmHg 71 106/67 mmHg 78 117/72 mmHg 85     Pt is well appearing, watching TV.  Vitals stable, no orthostatic hypotension, CP, palpitations, headaches, or focal neuro deficits on exam.  Sx's associated with positional change.  Labs and EKG are reassuring, has hematuria which is consistent with previous u/a results and pt aware.  Pt is feeling better after Antivert.  [facility].  Ambulates with steady gait.  She appears stable for d/c and agrees to return here for any worsening sx's.     Kem Parkinson, PA-C 01/08/15 1008  Milton Ferguson, MD 01/08/15 1018

## 2015-01-08 NOTE — ED Notes (Signed)
Pt reports dizziness x1 week. Pt denies headache,numbness, and tingling.

## 2015-01-08 NOTE — Discharge Instructions (Signed)
Dizziness Dizziness is a common problem. It makes you feel unsteady or lightheaded. You may feel like you are about to pass out (faint). Dizziness can lead to injury if you stumble or fall. Anyone can get dizzy, but dizziness is more common in older adults. This condition can be caused by a number of things, including:  Medicines.  Dehydration.  Illness. HOME CARE Following these instructions may help with your condition: Eating and Drinking  Drink enough fluid to keep your pee (urine) clear or pale yellow. This helps to keep you from getting dehydrated. Try to drink more clear fluids, such as water.  Do not drink alcohol.  Limit how much caffeine you drink or eat if told by your doctor.  Limit how much salt you drink or eat if told by your doctor. Activity  Avoid making quick movements.  When you stand up from sitting in a chair, steady yourself until you feel okay.  In the morning, first sit up on the side of the bed. When you feel okay, stand slowly while you hold onto something. Do this until you know that your balance is fine.  Move your legs often if you need to stand in one place for a long time. Tighten and relax your muscles in your legs while you are standing.  Do not drive or use heavy machinery if you feel dizzy.  Avoid bending down if you feel dizzy. Place items in your home so that they are easy for you to reach without leaning over. Lifestyle  Do not use any tobacco products, including cigarettes, chewing tobacco, or electronic cigarettes. If you need help quitting, ask your doctor.  Try to lower your stress level, such as with yoga or meditation. Talk with your doctor if you need help. General Instructions  Watch your dizziness for any changes.  Take medicines only as told by your doctor. Talk with your doctor if you think that your dizziness is caused by a medicine that you are taking.  Tell a friend or a family member that you are feeling dizzy. If he or  she notices any changes in your behavior, have this person call your doctor.  Keep all follow-up visits as told by your doctor. This is important. GET HELP IF:  Your dizziness does not go away.  Your dizziness or light-headedness gets worse.  You feel sick to your stomach (nauseous).  You have trouble hearing.  You have new symptoms.  You are unsteady on your feet or you feel like the room is spinning. GET HELP RIGHT AWAY IF:  You throw up (vomit) or have diarrhea and are unable to eat or drink anything.  You have trouble:  Talking.  Walking.  Swallowing.  Using your arms, hands, or legs.  You feel generally weak.  You are not thinking clearly or you have trouble forming sentences. It may take a friend or family member to notice this.  You have:  Chest pain.  Pain in your belly (abdomen).  Shortness of breath.  Sweating.  Your vision changes.  You are bleeding.  You have a headache.  You have neck pain or a stiff neck.  You have a fever.   This information is not intended to replace advice given to you by your health care provider. Make sure you discuss any questions you have with your health care provider.   Document Released: 03/08/2011 Document Revised: 08/03/2014 Document Reviewed: 03/15/2014 Elsevier Interactive Patient Education 2016 [employer]  Benign Positional Vertigo Vertigo  is the feeling that you or your surroundings are moving when they are not. Benign positional vertigo is the most common form of vertigo. The cause of this condition is not serious (is benign). This condition is triggered by certain movements and positions (is positional). This condition can be dangerous if it occurs while you are doing something that could endanger you or others, such as driving.  CAUSES In many cases, the cause of this condition is not known. It may be caused by a disturbance in an area of the inner ear that helps your brain to sense movement and  balance. This disturbance can be caused by a viral infection (labyrinthitis), head injury, or repetitive motion. RISK FACTORS This condition is more likely to develop in:  Women.  People who are 1 years of age or older. SYMPTOMS Symptoms of this condition usually happen when you move your head or your eyes in different directions. Symptoms may start suddenly, and they usually last for less than a minute. Symptoms may include:  Loss of balance and falling.  Feeling like you are spinning or moving.  Feeling like your surroundings are spinning or moving.  Nausea and vomiting.  Blurred vision.  Dizziness.  Involuntary eye movement (nystagmus). Symptoms can be mild and cause only slight annoyance, or they can be severe and interfere with daily life. Episodes of benign positional vertigo may return (recur) over time, and they may be triggered by certain movements. Symptoms may improve over time. DIAGNOSIS This condition is usually diagnosed by medical history and a physical exam of the head, neck, and ears. You may be referred to a health care provider who specializes in ear, nose, and throat (ENT) problems (otolaryngologist) or a provider who specializes in disorders of the nervous system (neurologist). You may have additional testing, including:  MRI.  A CT scan.  Eye movement tests. Your health care provider may ask you to change positions quickly while he or she watches you for symptoms of benign positional vertigo, such as nystagmus. Eye movement may be tested with an electronystagmogram (ENG), caloric stimulation, the Dix-Hallpike test, or the roll test.  An electroencephalogram (EEG). This records electrical activity in your brain.  Hearing tests. TREATMENT Usually, your health care provider will treat this by moving your head in specific positions to adjust your inner ear back to normal. Surgery may be needed in severe cases, but this is rare. In some cases, benign positional  vertigo may resolve on its own in 2-4 weeks. HOME CARE INSTRUCTIONS Safety  Move slowly.Avoid sudden body or head movements.  Avoid driving.  Avoid operating heavy machinery.  Avoid doing any tasks that would be dangerous to you or others if a vertigo episode would occur.  If you have trouble walking or keeping your balance, try using a cane for stability. If you feel dizzy or unstable, sit down right away.  Return to your normal activities as told by your health care provider. Ask your health care provider what activities are safe for you. General Instructions  Take over-the-counter and prescription medicines only as told by your health care provider.  Avoid certain positions or movements as told by your health care provider.  Drink enough fluid to keep your urine clear or pale yellow.  Keep all follow-up visits as told by your health care provider. This is important. SEEK MEDICAL CARE IF:  You have a fever.  Your condition gets worse or you develop new symptoms.  Your family or friends notice any behavioral  changes.  Your nausea or vomiting gets worse.  You have numbness or a "pins and needles" sensation. SEEK IMMEDIATE MEDICAL CARE IF:  You have difficulty speaking or moving.  You are always dizzy.  You faint.  You develop severe headaches.  You have weakness in your legs or arms.  You have changes in your hearing or vision.  You develop a stiff neck.  You develop sensitivity to light.   This information is not intended to replace advice given to you by your health care provider. Make sure you discuss any questions you have with your health care provider.   Document Released: 12/25/2005 Document Revised: 12/08/2014 Document Reviewed: 07/12/2014 Elsevier Interactive Patient Education [employer].

## 2015-01-12 ENCOUNTER — Encounter: Payer: Self-pay | Admitting: Physician Assistant

## 2015-01-12 ENCOUNTER — Ambulatory Visit: Payer: Self-pay | Admitting: Physician Assistant

## 2015-01-12 VITALS — BP 112/60 | HR 80 | Temp 97.7°F | Ht 67.75 in | Wt 170.0 lb

## 2015-01-12 DIAGNOSIS — I10 Essential (primary) hypertension: Secondary | ICD-10-CM

## 2015-01-12 DIAGNOSIS — F17210 Nicotine dependence, cigarettes, uncomplicated: Secondary | ICD-10-CM

## 2015-01-12 NOTE — Progress Notes (Signed)
BP 112/60 mmHg  Pulse 80  Temp(Src) 97.7 F (36.5 C)  Ht 5' 7.75" (1.721 m)  Wt 170 lb (77.111 kg)  BMI 26.03 kg/m2  SpO2 99%  LMP 08/12/2010   Subjective:    Patient ID: Amber Drake, female    DOB: 27-Nov-1960, 54 y.o.   MRN: [id number]  HPI: Amber Drake is a 54 y.o. female presenting on 01/12/2015 for HTN  HPI   Chief Complaint  Patient presents with  . Hypertension    pt states she is feeling well     Relevant past medical, surgical, family and social history reviewed and updated as indicated. Interim medical history since our last visit reviewed. Allergies and medications reviewed and updated.   Current outpatient prescriptions:  .  fluticasone (FLONASE) 50 MCG/ACT nasal spray, Place 2 sprays into both nostrils daily. (Patient taking differently: Place 2 sprays into both nostrils daily as needed for allergies. ), Disp: 16 g, Rfl: 0 .  ibuprofen (ADVIL,MOTRIN) 600 MG tablet, Take 1 tablet (600 mg total) by mouth every 6 (six) hours as needed., Disp: 30 tablet, Rfl: 0 .  lisinopril-hydrochlorothiazide (PRINZIDE,ZESTORETIC) 20-12.5 MG tablet, Take 1 tablet by mouth daily., Disp: , Rfl: 1 .  meclizine (ANTIVERT) 25 MG tablet, Take 1 tablet (25 mg total) by mouth 3 (three) times daily as needed for dizziness., Disp: 20 tablet, Rfl: 0 .  Multiple Vitamin (MULTIVITAMIN WITH MINERALS) TABS, Take 1 tablet by mouth daily., Disp: , Rfl:  .  ranitidine (ZANTAC) 150 MG tablet, Take 150 mg by mouth 2 (two) times daily., Disp: , Rfl:  .  rosuvastatin (CRESTOR) 20 MG tablet, Take 20 mg by mouth daily., Disp: , Rfl:  .  sodium chloride (OCEAN) 0.65 % SOLN nasal spray, Place 1 spray into both nostrils as needed for congestion., Disp: 480 mL, Rfl: 0   Review of Systems  Constitutional: Negative for fever, chills, diaphoresis, appetite change, fatigue and unexpected weight change.  HENT: Negative for congestion, drooling, ear pain, facial swelling, hearing loss, mouth sores,  sneezing, sore throat, trouble swallowing and voice change.   Eyes: Negative for pain, discharge, redness, itching and visual disturbance.  Respiratory: Negative for cough, choking, shortness of breath and wheezing.   Cardiovascular: Negative for chest pain, palpitations and leg swelling.  Gastrointestinal: Negative for vomiting, abdominal pain, diarrhea, constipation and blood in stool.  Endocrine: Negative for cold intolerance, heat intolerance and polydipsia.  Genitourinary: Negative for dysuria, hematuria and decreased urine volume.  Musculoskeletal: Negative for back pain, arthralgias and gait problem.  Skin: Negative for rash.  Allergic/Immunologic: Negative for environmental allergies.  Neurological: Negative for seizures, syncope, light-headedness and headaches.  Hematological: Negative for adenopathy.  Psychiatric/Behavioral: Negative for suicidal ideas, dysphoric mood and agitation. The patient is not nervous/anxious.     Per HPI unless specifically indicated above     Objective:    BP 112/60 mmHg  Pulse 80  Temp(Src) 97.7 F (36.5 C)  Ht 5' 7.75" (1.721 m)  Wt 170 lb (77.111 kg)  BMI 26.03 kg/m2  SpO2 99%  LMP 08/12/2010  Wt Readings from Last 3 Encounters:  01/12/15 170 lb (77.111 kg)  01/08/15 176 lb (79.833 kg)  01/03/15 172 lb (78.019 kg)    Physical Exam  Constitutional: She is oriented to person, place, and time. She appears well-developed and well-nourished.  HENT:  Head: Normocephalic and atraumatic.  Neck: Neck supple.  Cardiovascular: Normal rate and regular rhythm.   Pulmonary/Chest: Effort normal and breath sounds normal.  Abdominal: Soft. Bowel sounds are normal. She exhibits no mass. There is no tenderness.  Musculoskeletal: She exhibits no edema.  Lymphadenopathy:    She has no cervical adenopathy.  Neurological: She is alert and oriented to person, place, and time.  Skin: Skin is warm and dry.  Psychiatric: She has a normal mood and affect.  Her behavior is normal.  Vitals reviewed.     Assessment & Plan:    Encounter Diagnoses  Name Primary?  . Essential hypertension Yes  . Cigarette nicotine dependence, uncomplicated     bp improved on current meds  Smoking cessation counseled  F/u 2 mo to check lipids and bp

## 2015-01-12 NOTE — Patient Instructions (Signed)
Bring meds to every appointment.  Smoking Cessation, Tips for Success If you are ready to quit smoking, congratulations! You have chosen to help yourself be healthier. Cigarettes bring nicotine, tar, carbon monoxide, and other irritants into your body. Your lungs, heart, and blood vessels will be able to work better without these poisons. There are many different ways to quit smoking. Nicotine gum, nicotine patches, a nicotine inhaler, or nicotine nasal spray can help with physical craving. Hypnosis, support groups, and medicines help break the habit of smoking. WHAT THINGS CAN I DO TO MAKE QUITTING EASIER?  Here are some tips to help you quit for good:  Pick a date when you will quit smoking completely. Tell all of your friends and family about your plan to quit on that date.  Do not try to slowly cut down on the number of cigarettes you are smoking. Pick a quit date and quit smoking completely starting on that day.  Throw away all cigarettes.   Clean and remove all ashtrays from your home, work, and car.  On a card, write down your reasons for quitting. Carry the card with you and read it when you get the urge to smoke.  Cleanse your body of nicotine. Drink enough water and fluids to keep your urine clear or pale yellow. Do this after quitting to flush the nicotine from your body.  Learn to predict your moods. Do not let a bad situation be your excuse to have a cigarette. Some situations in your life might tempt you into wanting a cigarette.  Never have "just one" cigarette. It leads to wanting another and another. Remind yourself of your decision to quit.  Change habits associated with smoking. If you smoked while driving or when feeling stressed, try other activities to replace smoking. Stand up when drinking your coffee. Brush your teeth after eating. Sit in a different chair when you read the paper. Avoid alcohol while trying to quit, and try to drink fewer caffeinated beverages.  Alcohol and caffeine may urge you to smoke.  Avoid foods and drinks that can trigger a desire to smoke, such as sugary or spicy foods and alcohol.  Ask people who smoke not to smoke around you.  Have something planned to do right after eating or having a cup of coffee. For example, plan to take a walk or exercise.  Try a relaxation exercise to calm you down and decrease your stress. Remember, you may be tense and nervous for the first 2 weeks after you quit, but this will pass.  Find new activities to keep your hands busy. Play with a pen, coin, or rubber band. Doodle or draw things on paper.  Brush your teeth right after eating. This will help cut down on the craving for the taste of tobacco after meals. You can also try mouthwash.   Use oral substitutes in place of cigarettes. Try using lemon drops, carrots, cinnamon sticks, or chewing gum. Keep them handy so they are available when you have the urge to smoke.  When you have the urge to smoke, try deep breathing.  Designate your home as a nonsmoking area.  If you are a heavy smoker, ask your health care provider about a prescription for nicotine chewing gum. It can ease your withdrawal from nicotine.  Reward yourself. Set aside the cigarette money you save and buy yourself something nice.  Look for support from others. Join a support group or smoking cessation program. Ask someone at home or at work  to help you with your plan to quit smoking.  Always ask yourself, "Do I need this cigarette or is this just a reflex?" Tell yourself, "Today, I choose not to smoke," or "I do not want to smoke." You are reminding yourself of your decision to quit.  Do not replace cigarette smoking with electronic cigarettes (commonly called e-cigarettes). The safety of e-cigarettes is unknown, and some may contain harmful chemicals.  If you relapse, do not give up! Plan ahead and think about what you will do the next time you get the urge to smoke. HOW  WILL I FEEL WHEN I QUIT SMOKING? You may have symptoms of withdrawal because your body is used to nicotine (the addictive substance in cigarettes). You may crave cigarettes, be irritable, feel very hungry, cough often, get headaches, or have difficulty concentrating. The withdrawal symptoms are only temporary. They are strongest when you first quit but will go away within 10-14 days. When withdrawal symptoms occur, stay in control. Think about your reasons for quitting. Remind yourself that these are signs that your body is healing and getting used to being without cigarettes. Remember that withdrawal symptoms are easier to treat than the major diseases that smoking can cause.  Even after the withdrawal is over, expect periodic urges to smoke. However, these cravings are generally short lived and will go away whether you smoke or not. Do not smoke! WHAT RESOURCES ARE AVAILABLE TO HELP ME QUIT SMOKING? Your health care provider can direct you to community resources or hospitals for support, which may include:  Group support.  Education.  Hypnosis.  Therapy.   This information is not intended to replace advice given to you by your health care provider. Make sure you discuss any questions you have with your health care provider.   Document Released: 12/16/2003 Document Revised: 04/09/2014 Document Reviewed: 09/04/2012 Elsevier Interactive Patient Education [employer].

## 2015-03-01 ENCOUNTER — Emergency Department (HOSPITAL_COMMUNITY)
Admission: EM | Admit: 2015-03-01 | Discharge: 2015-03-01 | Disposition: A | Discharge status: Home / Self Care | Payer: Self-pay | Attending: Emergency Medicine | Admitting: Emergency Medicine

## 2015-03-01 ENCOUNTER — Emergency Department (HOSPITAL_COMMUNITY): Payer: Self-pay

## 2015-03-01 ENCOUNTER — Encounter (HOSPITAL_COMMUNITY): Payer: Self-pay | Admitting: Emergency Medicine

## 2015-03-01 DIAGNOSIS — R319 Hematuria, unspecified: Secondary | ICD-10-CM | POA: Insufficient documentation

## 2015-03-01 DIAGNOSIS — R42 Dizziness and giddiness: Secondary | ICD-10-CM | POA: Insufficient documentation

## 2015-03-01 DIAGNOSIS — R1013 Epigastric pain: Secondary | ICD-10-CM | POA: Insufficient documentation

## 2015-03-01 DIAGNOSIS — R634 Abnormal weight loss: Secondary | ICD-10-CM | POA: Insufficient documentation

## 2015-03-01 DIAGNOSIS — Z880 Allergy status to penicillin: Secondary | ICD-10-CM | POA: Insufficient documentation

## 2015-03-01 DIAGNOSIS — Z862 Personal history of diseases of the blood and blood-forming organs and certain disorders involving the immune mechanism: Secondary | ICD-10-CM | POA: Insufficient documentation

## 2015-03-01 DIAGNOSIS — I10 Essential (primary) hypertension: Secondary | ICD-10-CM | POA: Insufficient documentation

## 2015-03-01 DIAGNOSIS — Z79899 Other long term (current) drug therapy: Secondary | ICD-10-CM | POA: Insufficient documentation

## 2015-03-01 DIAGNOSIS — F17210 Nicotine dependence, cigarettes, uncomplicated: Secondary | ICD-10-CM | POA: Insufficient documentation

## 2015-03-01 DIAGNOSIS — Z7951 Long term (current) use of inhaled steroids: Secondary | ICD-10-CM | POA: Insufficient documentation

## 2015-03-01 DIAGNOSIS — M545 Low back pain: Secondary | ICD-10-CM | POA: Insufficient documentation

## 2015-03-01 DIAGNOSIS — E7800 Pure hypercholesterolemia, unspecified: Secondary | ICD-10-CM | POA: Insufficient documentation

## 2015-03-01 LAB — URINALYSIS, ROUTINE W REFLEX MICROSCOPIC
Bilirubin Urine: NEGATIVE
GLUCOSE, UA: NEGATIVE mg/dL
KETONES UR: NEGATIVE mg/dL
LEUKOCYTES UA: NEGATIVE
NITRITE: NEGATIVE
PROTEIN: NEGATIVE mg/dL
Specific Gravity, Urine: 1.015 (ref 1.005–1.030)
pH: 6 (ref 5.0–8.0)

## 2015-03-01 LAB — COMPREHENSIVE METABOLIC PANEL
ALT: 13 U/L — ABNORMAL LOW (ref 14–54)
ANION GAP: 10 (ref 5–15)
AST: 18 U/L (ref 15–41)
Albumin: 4 g/dL (ref 3.5–5.0)
Alkaline Phosphatase: 49 U/L (ref 38–126)
BILIRUBIN TOTAL: 0.4 mg/dL (ref 0.3–1.2)
BUN: 15 mg/dL (ref 6–20)
CHLORIDE: 99 mmol/L — AB (ref 101–111)
CO2: 29 mmol/L (ref 22–32)
Calcium: 10.1 mg/dL (ref 8.9–10.3)
Creatinine, Ser: 0.85 mg/dL (ref 0.44–1.00)
Glucose, Bld: 109 mg/dL — ABNORMAL HIGH (ref 65–99)
POTASSIUM: 3 mmol/L — AB (ref 3.5–5.1)
Sodium: 138 mmol/L (ref 135–145)
TOTAL PROTEIN: 7.9 g/dL (ref 6.5–8.1)

## 2015-03-01 LAB — CBC WITH DIFFERENTIAL/PLATELET
BASOS ABS: 0 10*3/uL (ref 0.0–0.1)
Basophils Relative: 0 %
EOS PCT: 1 %
Eosinophils Absolute: 0.1 10*3/uL (ref 0.0–0.7)
HEMATOCRIT: 32.3 % — AB (ref 36.0–46.0)
Hemoglobin: 11.2 g/dL — ABNORMAL LOW (ref 12.0–15.0)
LYMPHS PCT: 35 %
Lymphs Abs: 2.3 10*3/uL (ref 0.7–4.0)
MCH: 29.2 pg (ref 26.0–34.0)
MCHC: 34.7 g/dL (ref 30.0–36.0)
MCV: 84.3 fL (ref 78.0–100.0)
MONO ABS: 0.4 10*3/uL (ref 0.1–1.0)
MONOS PCT: 6 %
Neutro Abs: 3.8 10*3/uL (ref 1.7–7.7)
Neutrophils Relative %: 58 %
PLATELETS: 358 10*3/uL (ref 150–400)
RBC: 3.83 MIL/uL — ABNORMAL LOW (ref 3.87–5.11)
RDW: 12.7 % (ref 11.5–15.5)
WBC: 6.6 10*3/uL (ref 4.0–10.5)

## 2015-03-01 LAB — URINE MICROSCOPIC-ADD ON

## 2015-03-01 LAB — LIPASE, BLOOD: LIPASE: 45 U/L (ref 11–51)

## 2015-03-01 IMAGING — CT CT ABD-PELV W/O CM
2 of 4 series · 16 of 46 positions shown, 18 images · non-contrast
Comparison: [DATE]

CLINICAL DATA: Right lower quadrant abdominal pain, intermittent
for 2 days.

EXAM:
CT ABDOMEN AND PELVIS WITHOUT CONTRAST
TECHNIQUE: Multidetector CT imaging of the abdomen and pelvis was performed
following the standard protocol without IV contrast.

[Series 2: standard/full over (age)lbs 5.0 · axial · 0.59mm/px · z∈[-374,+21]mm · 13 of 87 slices shown, 15 images]
[im 4/87  soft-tissue]
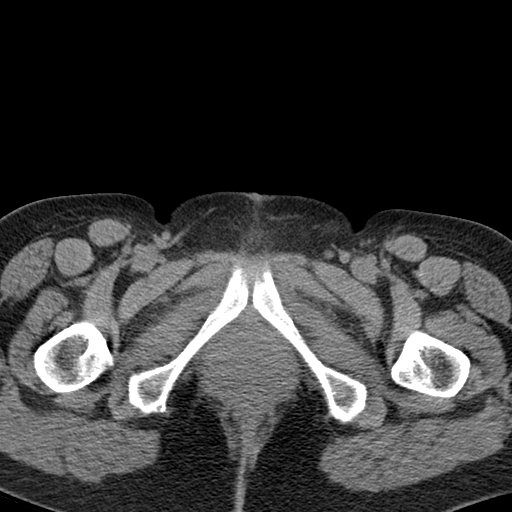
[im 4/87  bone]
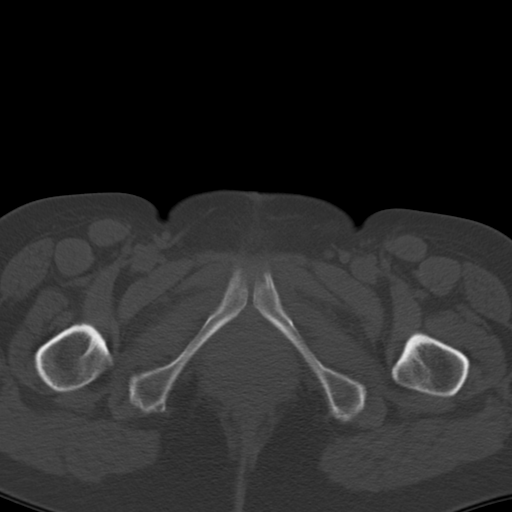
[im 11/87  soft-tissue]
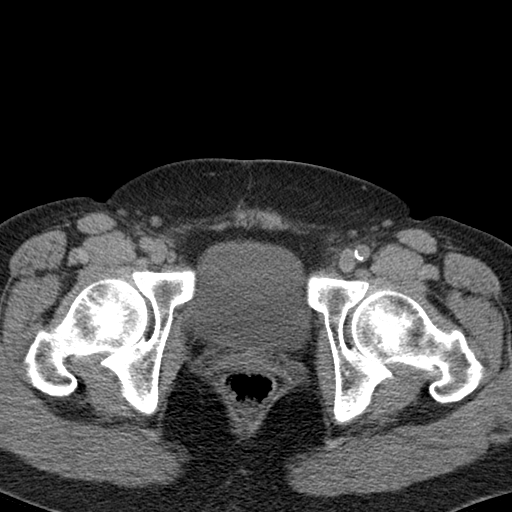
[im 18/87  soft-tissue]
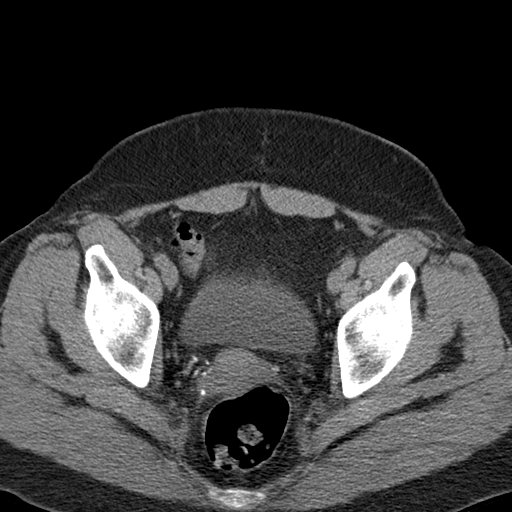
[im 26/87  soft-tissue]
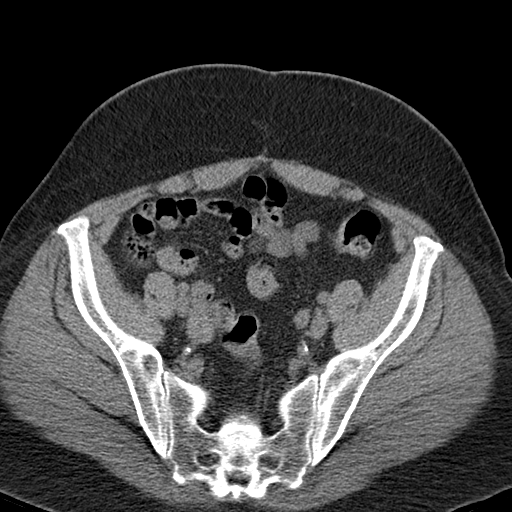
[im 29/87  soft-tissue]
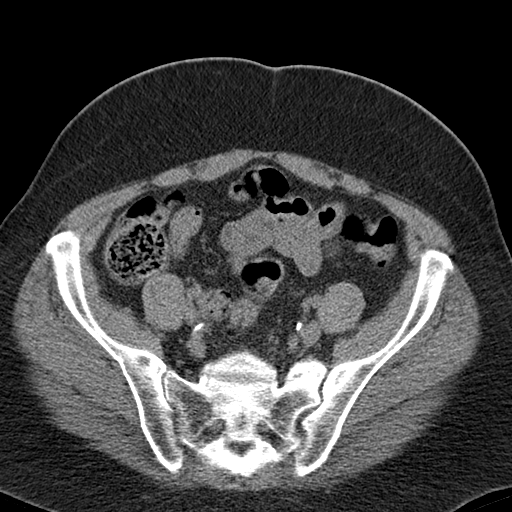
[im 36/87  soft-tissue]
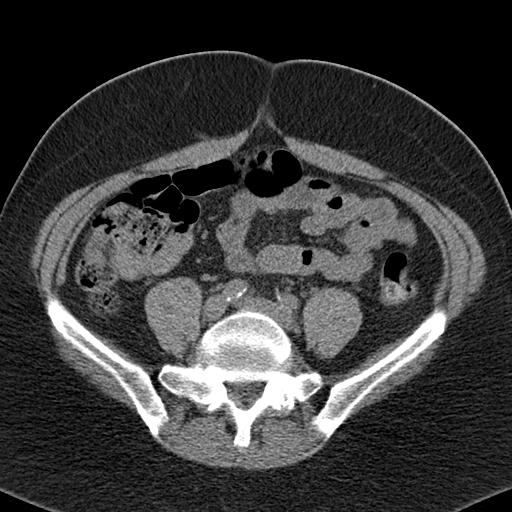
[im 44/87  soft-tissue]
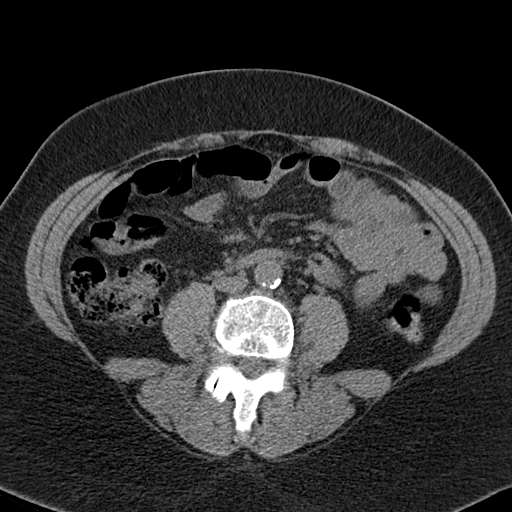
[im 51/87  soft-tissue]
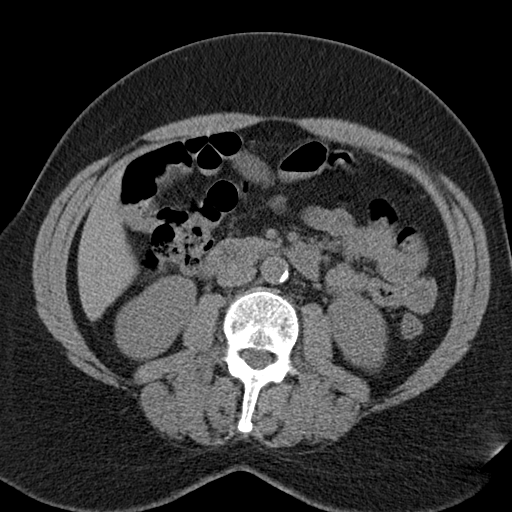
[im 58/87  soft-tissue]
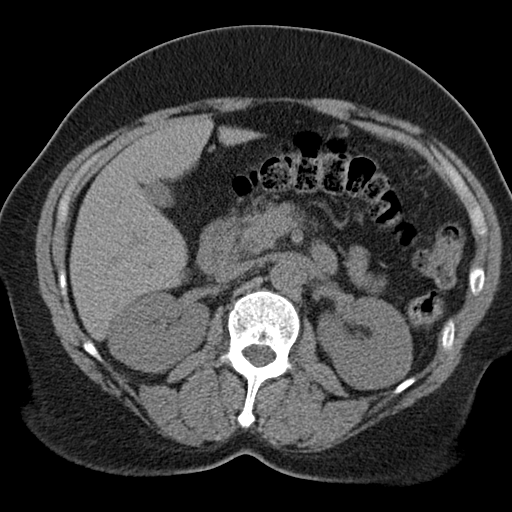
[im 58/87  bone]
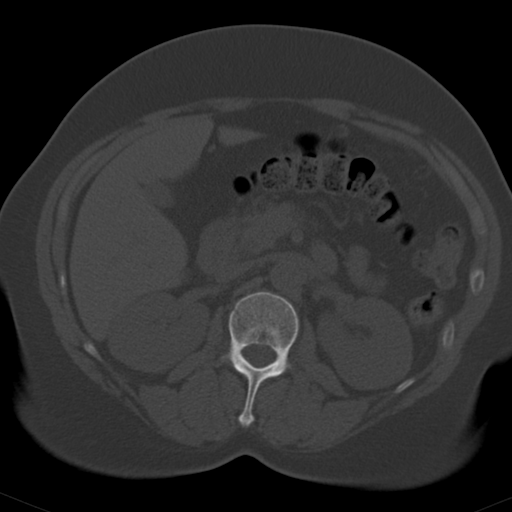
[im 61/87  soft-tissue]
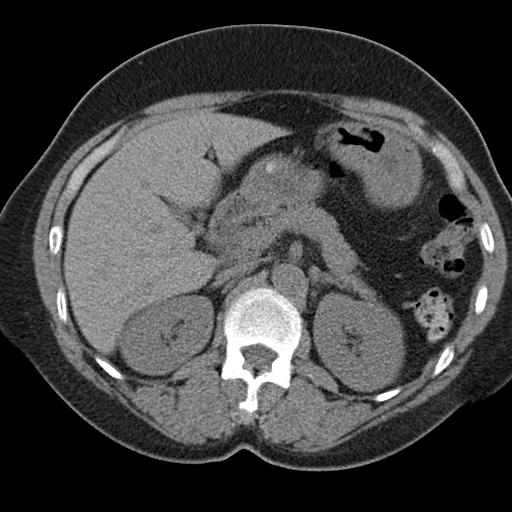
[im 69/87  soft-tissue]
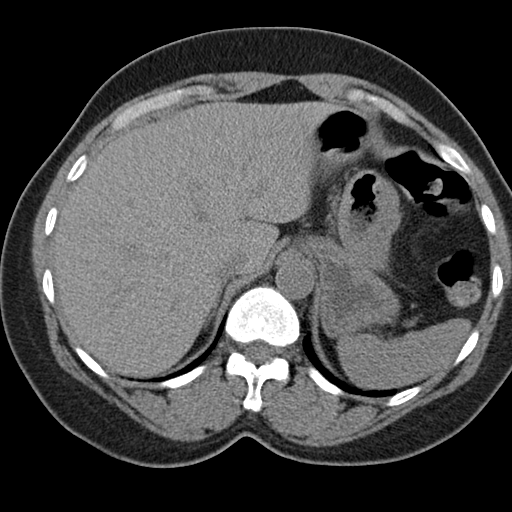
[im 76/87  soft-tissue]
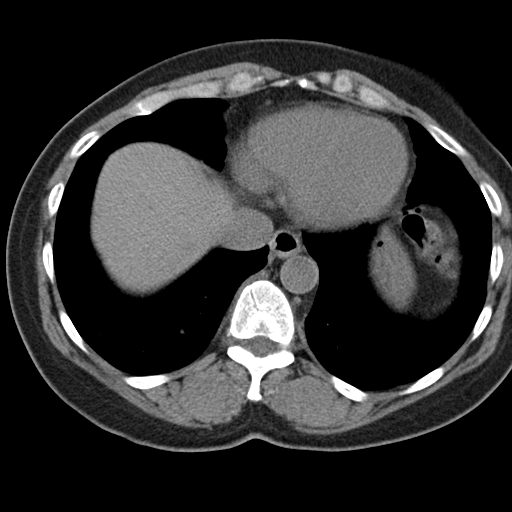
[im 83/87  soft-tissue]
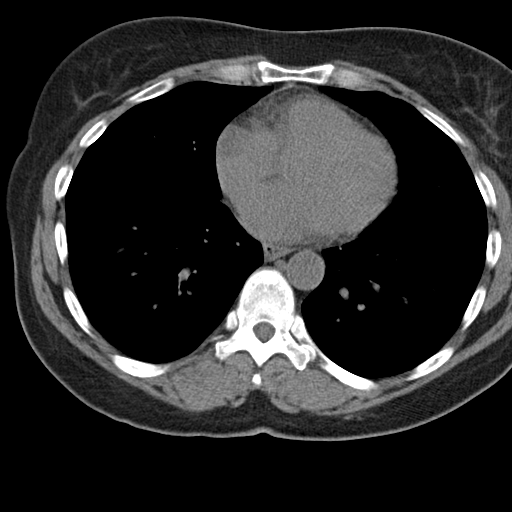

[Series 3: mpr coronal · coronal · 0.66mm/px · 3 of 102 slices shown]
[im 34/102  soft-tissue]
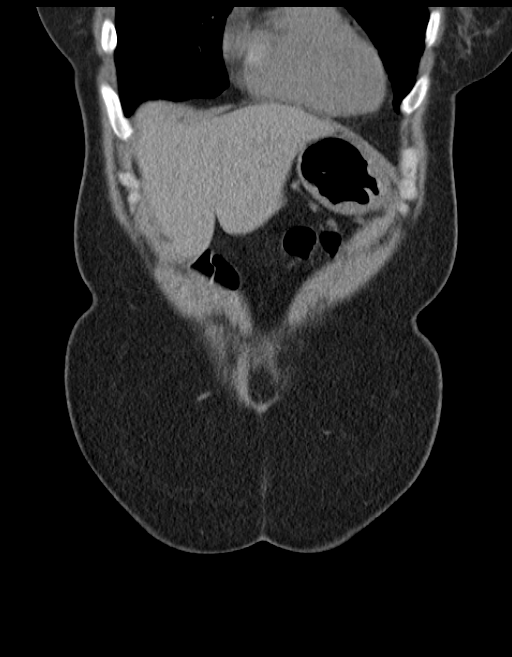
[im 45/102  soft-tissue]
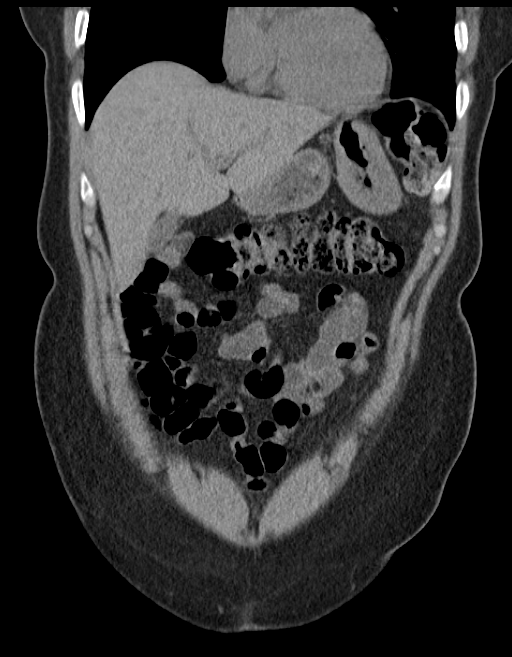
[im 57/102  soft-tissue]
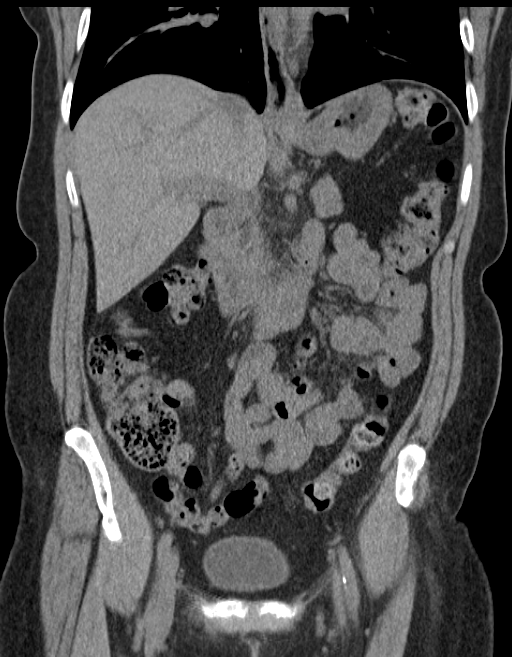

[16 of 46 positions shown; findings below may reference images not displayed]

FINDINGS: There are unremarkable unenhanced appearances of the liver,
gallbladder, bile ducts, pancreas, spleen, adrenals, kidneys and
ureters. The abdominal aorta is normal in caliber. There is mild
atherosclerotic calcification. There is no adenopathy in the abdomen
or pelvis. The stomach, small bowel and colon are remarkable only
for mild uncomplicated colonic diverticulosis. Unremarkable uterus
and ovaries. No acute inflammatory changes are evident in the
abdomen or pelvis. No significant abnormality is evident in the
lower chest. No significant musculoskeletal abnormality is evident.
Tiny fat containing umbilical hernia incidentally noted.
IMPRESSION: No acute findings are evident in the abdomen or pelvis. Incidental
findings include mild calcified aortic plaque and a small fat
containing umbilical hernia.

## 2015-03-01 MED ORDER — PANTOPRAZOLE SODIUM 40 MG PO TBEC
40.0000 mg | DELAYED_RELEASE_TABLET | Freq: Once | ORAL | Status: AC
  Administered 2015-03-01: 40 mg via ORAL
  Filled 2015-03-01: qty 1

## 2015-03-01 MED ORDER — MECLIZINE HCL 50 MG PO TABS: 50.0000 mg | ORAL_TABLET | Freq: Three times a day (TID) | ORAL | Status: DC | PRN

## 2015-03-01 MED ORDER — POTASSIUM CHLORIDE CRYS ER 20 MEQ PO TBCR
40.0000 meq | EXTENDED_RELEASE_TABLET | Freq: Once | ORAL | Status: AC
  Administered 2015-03-01: 40 meq via ORAL
  Filled 2015-03-01: qty 2

## 2015-03-01 MED ORDER — GI COCKTAIL ~~LOC~~
30.0000 mL | Freq: Once | ORAL | Status: AC
  Administered 2015-03-01: 30 mL via ORAL
  Filled 2015-03-01: qty 30

## 2015-03-01 MED ORDER — PANTOPRAZOLE SODIUM 40 MG PO TBEC: 40.0000 mg | DELAYED_RELEASE_TABLET | Freq: Every day | ORAL | Status: DC

## 2015-03-01 NOTE — Discharge Instructions (Signed)
Possible Gastritis, Adult Gastritis is soreness and swelling (inflammation) of the lining of the stomach. Gastritis can develop as a sudden onset (acute) or long-term (chronic) condition. If gastritis is not treated, it can lead to stomach bleeding and ulcers. CAUSES  Gastritis occurs when the stomach lining is weak or damaged. Digestive juices from the stomach then inflame the weakened stomach lining. The stomach lining may be weak or damaged due to viral or bacterial infections. One common bacterial infection is the Helicobacter pylori infection. Gastritis can also result from excessive alcohol consumption, taking certain medicines, or having too much acid in the stomach.  SYMPTOMS  In some cases, there are no symptoms. When symptoms are present, they may include:  Pain or a burning sensation in the upper abdomen.  Nausea.  Vomiting.  An uncomfortable feeling of fullness after eating. DIAGNOSIS  Your caregiver may suspect you have gastritis based on your symptoms and a physical exam. To determine the cause of your gastritis, your caregiver may perform the following:  Blood or stool tests to check for the H pylori bacterium.  Gastroscopy. A thin, flexible tube (endoscope) is passed down the esophagus and into the stomach. The endoscope has a light and camera on the end. Your caregiver uses the endoscope to view the inside of the stomach.  Taking a tissue sample (biopsy) from the stomach to examine under a microscope. TREATMENT  Depending on the cause of your gastritis, medicines may be prescribed. If you have a bacterial infection, such as an H pylori infection, antibiotics may be given. If your gastritis is caused by too much acid in the stomach, H2 blockers or antacids may be given. Your caregiver may recommend that you stop taking aspirin, ibuprofen, or other nonsteroidal anti-inflammatory drugs (NSAIDs). HOME CARE INSTRUCTIONS  Only take over-the-counter or prescription medicines as  directed by your caregiver.  If you were given antibiotic medicines, take them as directed. Finish them even if you start to feel better.  Drink enough fluids to keep your urine clear or pale yellow.  Avoid foods and drinks that make your symptoms worse, such as:  Caffeine or alcoholic drinks.  Chocolate.  Peppermint or mint flavorings.  Garlic and onions.  Spicy foods.  Citrus fruits, such as oranges, lemons, or limes.  Tomato-based foods such as sauce, chili, salsa, and pizza.  Fried and fatty foods.  Eat small, frequent meals instead of large meals. SEEK IMMEDIATE MEDICAL CARE IF:   You have black or dark red stools.  You vomit blood or material that looks like coffee grounds.  You are unable to keep fluids down.  Your abdominal pain gets worse.  You have a fever.  You do not feel better after 1 week.  You have any other questions or concerns. MAKE SURE YOU:  Understand these instructions.  Will watch your condition.  Will get help right away if you are not doing well or get worse.   This information is not intended to replace advice given to you by your health care provider. Make sure you discuss any questions you have with your health care provider.   Document Released: 03/13/2001 Document Revised: 09/18/2011 Document Reviewed: 05/02/2011 Elsevier Interactive Patient Education 2016 Elsevier Inc.    Dizziness Dizziness is a common problem. It is a feeling of unsteadiness or light-headedness. You may feel like you are about to faint. Dizziness can lead to injury if you stumble or fall. Anyone can become dizzy, but dizziness is more common in older adults. This  condition can be caused by a number of things, including medicines, dehydration, or illness. HOME CARE INSTRUCTIONS Taking these steps may help with your condition: Eating and Drinking  Drink enough fluid to keep your urine clear or pale yellow. This helps to keep you from becoming dehydrated.  Try to drink more clear fluids, such as water.  Do not drink alcohol.  Limit your caffeine intake if directed by your health care provider.  Limit your salt intake if directed by your health care provider. Activity  Avoid making quick movements.  Rise slowly from chairs and steady yourself until you feel okay.  In the morning, first sit up on the side of the bed. When you feel okay, stand slowly while you hold onto something until you know that your balance is fine.  Move your legs often if you need to stand in one place for a long time. Tighten and relax your muscles in your legs while you are standing.  Do not drive or operate heavy machinery if you feel dizzy.  Avoid bending down if you feel dizzy. Place items in your home so that they are easy for you to reach without leaning over. Lifestyle  Do not use any tobacco products, including cigarettes, chewing tobacco, or electronic cigarettes. If you need help quitting, ask your health care provider.  Try to reduce your stress level, such as with yoga or meditation. Talk with your health care provider if you need help. General Instructions  Watch your dizziness for any changes.  Take medicines only as directed by your health care provider. Talk with your health care provider if you think that your dizziness is caused by a medicine that you are taking.  Tell a friend or a family member that you are feeling dizzy. If he or she notices any changes in your behavior, have this person call your health care provider.  Keep all follow-up visits as directed by your health care provider. This is important. SEEK MEDICAL CARE IF:  Your dizziness does not go away.  Your dizziness or light-headedness gets worse.  You feel nauseous.  You have reduced hearing.  You have new symptoms.  You are unsteady on your feet or you feel like the room is spinning. SEEK IMMEDIATE MEDICAL CARE IF:  You vomit or have diarrhea and are unable to eat  or drink anything.  You have problems talking, walking, swallowing, or using your arms, hands, or legs.  You feel generally weak.  You are not thinking clearly or you have trouble forming sentences. It may take a friend or family member to notice this.  You have chest pain, abdominal pain, shortness of breath, or sweating.  Your vision changes.  You notice any bleeding.  You have a headache.  You have neck pain or a stiff neck.  You have a fever.   This information is not intended to replace advice given to you by your health care provider. Make sure you discuss any questions you have with your health care provider.   Document Released: 09/12/2000 Document Revised: 08/03/2014 Document Reviewed: 03/15/2014 Elsevier Interactive Patient Education 2016 Elsevier Inc.  Hematuria, Adult Hematuria is blood in your urine. It can be caused by a bladder infection, kidney infection, prostate infection, kidney stone, or cancer of your urinary tract. Infections can usually be treated with medicine, and a kidney stone usually will pass through your urine. If neither of these is the cause of your hematuria, further workup to find out the reason may  be needed. It is very important that you tell your health care provider about any blood you see in your urine, even if the blood stops without treatment or happens without causing pain. Blood in your urine that happens and then stops and then happens again can be a symptom of a very serious condition. Also, pain is not a symptom in the initial stages of many urinary cancers. HOME CARE INSTRUCTIONS   Drink lots of fluid, 3-4 quarts a day. If you have been diagnosed with an infection, cranberry juice is especially recommended, in addition to large amounts of water.  Avoid caffeine, tea, and carbonated beverages because they tend to irritate the bladder.  Avoid alcohol because it may irritate the prostate.  Take all medicines as directed by your health  care provider.  If you were prescribed an antibiotic medicine, finish it all even if you start to feel better.  If you have been diagnosed with a kidney stone, follow your health care provider's instructions regarding straining your urine to catch the stone.  Empty your bladder often. Avoid holding urine for long periods of time.  After a bowel movement, women should cleanse front to back. Use each tissue only once.  Empty your bladder before and after sexual intercourse if you are a female. SEEK MEDICAL CARE IF:  You develop back pain.  You have a fever.  You have a feeling of sickness in your stomach (nausea) or vomiting.  Your symptoms are not better in 3 days. Return sooner if you are getting worse. SEEK IMMEDIATE MEDICAL CARE IF:   You develop severe vomiting and are unable to keep the medicine down.  You develop severe back or abdominal pain despite taking your medicines.  You begin passing a large amount of blood or clots in your urine.  You feel extremely weak or faint, or you pass out. MAKE SURE YOU:   Understand these instructions.  Will watch your condition.  Will get help right away if you are not doing well or get worse.   This information is not intended to replace advice given to you by your health care provider. Make sure you discuss any questions you have with your health care provider.   Document Released: 03/19/2005 Document Revised: 04/09/2014 Document Reviewed: 11/17/2012 Elsevier Interactive Patient Education [employer].

## 2015-03-01 NOTE — ED Provider Notes (Signed)
By signing my name below, I, Helane Gunther, attest that this documentation has been prepared under the direction and in the presence of [employer], DO. Electronically Signed: Helane Gunther, ED Scribe. 03/01/2015. 12:20 AM.  TIME SEEN: 12:14 AM  CHIEF COMPLAINT: Abdominal Pain  HPI: Amber Drake is a 54 y.o. female smoker at 0.5 ppd with a PMHx of Sickle Cell Trait, HTN, and hypercholesteremia who presents to the Emergency Department complaining of constant, aching, right lower back pain and central abdominal pain that started 2 days ago. She reports associated unexpected weight loss (8lbs in the last month) and chronic dizziness from standing up or walking (onset several months ago). Was seen in the emergency department for her dizziness with negative workup and sent home on Antivert which she reports helped. States she is out of this medication. She denies ever having similar pains before. She notes alleviation of the pain after taking Pepto Bismol. She denies exacerbating factors. States back pain is not worse with movement. No changes with food. She reports having normal BM's. She notes Roslynn Amble PA is still her PCP. Pt denies fever, chills, night sweats, n/v/d, dysuria, and hematuria.  No vaginal bleeding or discharge. LMP in 2012. No known h/o cancer.  Reports she has had a previous colonoscopy that was normal. She is status post appendectomy and C-section 2.    Pt is allergic to penicillins.   ROS: See HPI Constitutional: no fever  Eyes: no drainage  ENT: no runny nose   Cardiovascular:  no chest pain  Resp: no SOB  GI: no vomiting GU: no dysuria Integumentary: no rash  Allergy: no hives  Musculoskeletal: no leg swelling  Neurological: no slurred speech ROS otherwise negative  PAST MEDICAL HISTORY/PAST SURGICAL HISTORY:  Past Medical History  Diagnosis Date  . Sickle cell trait ([place])   . Hypertension     does not take meds  . Blood dyscrasia     sickle cell trait  .  Hypercholesterolemia     MEDICATIONS:  Prior to Admission medications   Medication Sig Start Date End Date Taking? Authorizing Provider  fluticasone (FLONASE) 50 MCG/ACT nasal spray Place 2 sprays into both nostrils daily. Patient taking differently: Place 2 sprays into both nostrils daily as needed for allergies.  11/16/14   [name], MD  ibuprofen (ADVIL,MOTRIN) 600 MG tablet Take 1 tablet (600 mg total) by mouth every 6 (six) hours as needed. 10/03/14   Evalee Jefferson, PA-C  lisinopril-hydrochlorothiazide (PRINZIDE,ZESTORETIC) 20-12.5 MG tablet Take 1 tablet by mouth daily. 12/15/14   Historical Provider, MD  meclizine (ANTIVERT) 25 MG tablet Take 1 tablet (25 mg total) by mouth 3 (three) times daily as needed for dizziness. 01/08/15   Tammy Triplett, PA-C  Multiple Vitamin (MULTIVITAMIN WITH MINERALS) TABS Take 1 tablet by mouth daily.    Historical Provider, MD  ranitidine (ZANTAC) 150 MG tablet Take 150 mg by mouth 2 (two) times daily.    Historical Provider, MD  rosuvastatin (CRESTOR) 20 MG tablet Take 20 mg by mouth daily.    Historical Provider, MD  sodium chloride (OCEAN) 0.65 % SOLN nasal spray Place 1 spray into both nostrils as needed for congestion. 11/16/14   [name], MD    ALLERGIES:  Allergies  Allergen Reactions  . Penicillins Anaphylaxis and Hives    Has patient had a PCN reaction causing immediate rash, facial/tongue/throat swelling, SOB or lightheadedness with hypotension: YES Has patient had a PCN reaction causing severe rash involving mucus membranes  or skin necrosis: No Has patient had a PCN reaction that required hospitalization: NO Has patient had a PCN reaction occurring within the last 10 years: NO If all of the above answers are "NO", then may proceed with Cephalosporin use.   Marland Kitchen Penicillins Cross Reactors Itching and Swelling  . Tape Itching    SOCIAL HISTORY:  Social History  Substance Use Topics  . Smoking status: Current Every Day Smoker --  0.50 packs/day for 30 years    Types: Cigarettes  . Smokeless tobacco: Not on file  . Alcohol Use: No    FAMILY HISTORY: Family History  Problem Relation Age of Onset  . Kidney failure Daughter   . Sickle cell anemia Daughter   . Sickle cell anemia Son   . Anesthesia problems Neg Hx   . Hypotension Neg Hx   . Malignant hyperthermia Neg Hx   . Pseudochol deficiency Neg Hx     EXAM: BP 121/82 mmHg  Pulse 83  Temp(Src) 98.3 F (36.8 C)  Resp 18  Ht 5\' 4"  (1.626 m)  Wt 166 lb (75.297 kg)  BMI 28.48 kg/m2  SpO2 100%  LMP 08/12/2010 CONSTITUTIONAL: Alert and oriented and responds appropriately to questions. Well-appearing; well-nourished HEAD: Normocephalic EYES: Conjunctivae clear, PERRL ENT: normal nose; no rhinorrhea; moist mucous membranes; pharynx without lesions noted NECK: Supple, no meningismus, no LAD  CARD: RRR; S1 and S2 appreciated; no murmurs, no clicks, no rubs, no gallops RESP: Normal chest excursion without splinting or tachypnea; breath sounds clear and equal bilaterally; no wheezes, no rhonchi, no rales, no hypoxia or respiratory distress, speaking full sentences ABD/GI: Normal bowel sounds; non-distended; soft, minimally tender in the epigastric region, no rebound, no guarding, no peritoneal signs, negative Murphy sign BACK:  The back appears normal and is only tender over the right lumbar paraspinal musculature without associated lesions, no midline spinal tenderness or step-off or deformity, there is no CVA tenderness EXT: Normal ROM in all joints; non-tender to palpation; no edema; normal capillary refill; no cyanosis, no calf tenderness or swelling    SKIN: Normal color for age and race; warm NEURO: Moves all extremities equally, sensation to light touch intact diffusely, cranial nerves II through XII intact; normal gait PSYCH: The patient's mood and manner are appropriate. Grooming and personal hygiene are appropriate.  MEDICAL DECISION MAKING: Patient  here with complaints of lower back pain and epigastric abdominal pain. Her exam is benign. She is hemodynamically stable, afebrile, nontoxic appearing. Patient has a history of daily stand. Differential diagnosis includes UTI, pyelonephritis, kidney stones, gastritis, less likely cholecystitis or cholelithiasis, pancreatitis. Will give GI cocktail, Protonix and reassess. Will check abdominal labs and urinalysis.   ED PROGRESS: Patient's urine shows large hemoglobin. This appears chronic for patient for her to have microscopic sure you but now she has too numerous to count RBCs. She has few bacteria but no other sign of infection. She denies any urinary symptoms. She does have new onset flank pain will proceed with CT scan to evaluate for possible stone, kidney mass.  Labs otherwise unremarkable.  No leukocytosis. LFTs, creatinine and lipase normal. Potassium slightly low at 3.0, will place in the emergency department. She reports her abdominal pain has improved after GI cocktail and protonix.  2:20 AM  Pt still feeling well. Her CT scan shows no acute abnormality. Suspect that her epigastric pain may be related to gastritis, GERD. Recommended that she follow-up with primary care physician for further management for her chronic symptoms. Discussed again  with patient that she may need a colonoscopy, endoscopy as an outpatient if she continues to have abdominal pain and weight loss. Hematuria is chronic for patient and have again advised her to follow-up with her PCP for this. She states that she does have intermittent dizziness that changes with changes in position. She has not been orthostatic in the past. No vomiting or diarrhea. Eating and drinking well. Reports Antivert has helped her. We'll give her refill for this medication but have again advised her to follow-up with her PCP. Discussed return precautions. She verbalized understanding and is comfortable with this plan.   I personally performed the  services described in this documentation, which was scribed in my presence. The recorded information has been reviewed and is accurate.   [place], DO 03/01/15 0222

## 2015-03-01 NOTE — ED Notes (Signed)
Pt c/o rt lower back pain that radiates to lower abd.

## 2015-03-02 ENCOUNTER — Ambulatory Visit: Payer: Self-pay | Admitting: Physician Assistant

## 2015-03-02 ENCOUNTER — Telehealth: Payer: Self-pay | Admitting: Student

## 2015-03-02 ENCOUNTER — Encounter: Payer: Self-pay | Admitting: Physician Assistant

## 2015-03-02 ENCOUNTER — Other Ambulatory Visit: Payer: Self-pay | Admitting: Physician Assistant

## 2015-03-02 VITALS — BP 120/68 | HR 99 | Temp 98.4°F | Ht 67.75 in | Wt 166.2 lb

## 2015-03-02 DIAGNOSIS — F32A Depression, unspecified: Secondary | ICD-10-CM

## 2015-03-02 DIAGNOSIS — F329 Major depressive disorder, single episode, unspecified: Secondary | ICD-10-CM

## 2015-03-02 DIAGNOSIS — I10 Essential (primary) hypertension: Secondary | ICD-10-CM

## 2015-03-02 DIAGNOSIS — E876 Hypokalemia: Secondary | ICD-10-CM

## 2015-03-02 DIAGNOSIS — R319 Hematuria, unspecified: Secondary | ICD-10-CM

## 2015-03-02 DIAGNOSIS — F419 Anxiety disorder, unspecified: Secondary | ICD-10-CM

## 2015-03-02 DIAGNOSIS — K219 Gastro-esophageal reflux disease without esophagitis: Secondary | ICD-10-CM

## 2015-03-02 LAB — POTASSIUM: Potassium: 3.7 mmol/L (ref 3.5–5.3)

## 2015-03-02 MED ORDER — CITALOPRAM HYDROBROMIDE 20 MG PO TABS: 20.0000 mg | ORAL_TABLET | Freq: Every day | ORAL | Status: DC

## 2015-03-02 NOTE — Telephone Encounter (Signed)
Called and notified pt that lab order had been sent for [place] to recheck her potassium due to it being low when she went to the hospital yesterday. Pt understood and stated she would go today.

## 2015-03-02 NOTE — Progress Notes (Signed)
BP 120/68 mmHg  Pulse 99  Temp(Src) 98.4 F (36.9 C)  Ht 5' 7.75" (1.721 m)  Wt 166 lb 3.2 oz (75.388 kg)  BMI 25.45 kg/m2  SpO2 97%  LMP 08/12/2010   Subjective:    Patient ID: Amber Drake, female    DOB: 05/11/1960, 54 y.o.   MRN: [id number]  HPI: Amber Drake is a 54 y.o. female presenting on 03/02/2015 for Weight Loss; Hematuria; and Depression   HPI Chief Complaint  Patient presents with  . Weight Loss    is tearful due to does not know why is losing weight  . Hematuria    was told had blood in urine @ [facility] Monday night, had CT scan  . Depression    her baby's dady died last minth and 2 kids with Sickle Cell are in and out of hospital   -Pt says she has had blood in urine for a long time but has never had evaluation for it -pt has no SI or HI  Relevant past medical, surgical, family and social history reviewed and updated as indicated. Interim medical history since our last visit reviewed. Allergies and medications reviewed and updated.  Review of Systems  Constitutional: Positive for unexpected weight change. Negative for fever, chills, diaphoresis, appetite change and fatigue.  HENT: Negative for congestion, dental problem, drooling, ear pain, facial swelling, hearing loss, mouth sores, sneezing, sore throat, trouble swallowing and voice change.   Eyes: Positive for redness. Negative for pain, discharge, itching and visual disturbance.  Respiratory: Negative for cough, choking, shortness of breath and wheezing.   Cardiovascular: Negative for chest pain, palpitations and leg swelling.  Gastrointestinal: Negative for vomiting, abdominal pain, diarrhea, constipation and blood in stool.  Endocrine: Negative for cold intolerance, heat intolerance and polydipsia.  Genitourinary: Positive for hematuria. Negative for dysuria and decreased urine volume.  Musculoskeletal: Negative for back pain, arthralgias and gait problem.  Skin: Negative for rash.   Allergic/Immunologic: Negative for environmental allergies.  Neurological: Negative for seizures, syncope, light-headedness and headaches.  Hematological: Negative for adenopathy.  Psychiatric/Behavioral: Negative for suicidal ideas, dysphoric mood and agitation. The patient is not nervous/anxious.     Per HPI unless specifically indicated above     Objective:    BP 120/68 mmHg  Pulse 99  Temp(Src) 98.4 F (36.9 C)  Ht 5' 7.75" (1.721 m)  Wt 166 lb 3.2 oz (75.388 kg)  BMI 25.45 kg/m2  SpO2 97%  LMP 08/12/2010  Wt Readings from Last 3 Encounters:  03/02/15 166 lb 3.2 oz (75.388 kg)  03/01/15 166 lb (75.297 kg)  01/12/15 170 lb (77.111 kg)     phq-9 score 5 Gad 7 score 16  Physical Exam  Constitutional: She is oriented to person, place, and time. She appears well-developed and well-nourished.  HENT:  Head: Normocephalic and atraumatic.  Neck: Neck supple.  Cardiovascular: Normal rate and regular rhythm.   Pulmonary/Chest: Effort normal and breath sounds normal.  Abdominal: Soft. Bowel sounds are normal. She exhibits no mass. There is no tenderness.  Musculoskeletal: She exhibits no edema.  Lymphadenopathy:    She has no cervical adenopathy.  Neurological: She is alert and oriented to person, place, and time.  Skin: Skin is warm and dry.  Psychiatric: She has a normal mood and affect. Her behavior is normal.  Vitals reviewed.     Results for orders placed or performed during the hospital encounter of 03/01/15  CBC with Differential  Result Value Ref Range   WBC 6.6  4.0 - 10.5 K/uL   RBC 3.83 (L) 3.87 - 5.11 MIL/uL   Hemoglobin 11.2 (L) 12.0 - 15.0 g/dL   HCT 32.3 (L) 36.0 - 46.0 %   MCV 84.3 78.0 - 100.0 fL   MCH 29.2 26.0 - 34.0 pg   MCHC 34.7 30.0 - 36.0 g/dL   RDW 12.7 11.5 - 15.5 %   Platelets 358 150 - 400 K/uL   Neutrophils Relative % 58 %   Neutro Abs 3.8 1.7 - 7.7 K/uL   Lymphocytes Relative 35 %   Lymphs Abs 2.3 0.7 - 4.0 K/uL   Monocytes  Relative 6 %   Monocytes Absolute 0.4 0.1 - 1.0 K/uL   Eosinophils Relative 1 %   Eosinophils Absolute 0.1 0.0 - 0.7 K/uL   Basophils Relative 0 %   Basophils Absolute 0.0 0.0 - 0.1 K/uL  Comprehensive metabolic panel  Result Value Ref Range   Sodium 138 135 - 145 mmol/L   Potassium 3.0 (L) 3.5 - 5.1 mmol/L   Chloride 99 (L) 101 - 111 mmol/L   CO2 29 22 - 32 mmol/L   Glucose, Bld 109 (H) 65 - 99 mg/dL   BUN 15 6 - 20 mg/dL   Creatinine, Ser 0.85 0.44 - 1.00 mg/dL   Calcium 10.1 8.9 - 10.3 mg/dL   Total Protein 7.9 6.5 - 8.1 g/dL   Albumin 4.0 3.5 - 5.0 g/dL   AST 18 15 - 41 U/L   ALT 13 (L) 14 - 54 U/L   Alkaline Phosphatase 49 38 - 126 U/L   Total Bilirubin 0.4 0.3 - 1.2 mg/dL   GFR calc non Af Amer >60 >60 mL/min   GFR calc Af Amer >60 >60 mL/min   Anion gap 10 5 - 15  Lipase, blood  Result Value Ref Range   Lipase 45 11 - 51 U/L  Urinalysis, Routine w reflex microscopic (not at [facility])  Result Value Ref Range   Color, Urine YELLOW YELLOW   APPearance CLEAR CLEAR   Specific Gravity, Urine 1.015 1.005 - 1.030   pH 6.0 5.0 - 8.0   Glucose, UA NEGATIVE NEGATIVE mg/dL   Hgb urine dipstick LARGE (A) NEGATIVE   Bilirubin Urine NEGATIVE NEGATIVE   Ketones, ur NEGATIVE NEGATIVE mg/dL   Protein, ur NEGATIVE NEGATIVE mg/dL   Nitrite NEGATIVE NEGATIVE   Leukocytes, UA NEGATIVE NEGATIVE  Urine microscopic-add on  Result Value Ref Range   Squamous Epithelial / LPF 0-5 (A) NONE SEEN   WBC, UA 0-5 0 - 5 WBC/hpf   RBC / HPF TOO NUMEROUS TO COUNT 0 - 5 RBC/hpf   Bacteria, UA FEW (A) NONE SEEN      Assessment & Plan:   Encounter Diagnoses  Name Primary?  . Hematuria Yes  . Depression   . Anxiety disorder, unspecified anxiety disorder type   . Gastroesophageal reflux disease, esophagitis presence not specified   . Essential hypertension, benign   . Hypokalemia    -rx citalopram. Pt to call [facility] for couseling -Send urine for culture.  If negative will refer to urology.   -Cont protonix -recheck K+ -F/u 12/12 as scheduled

## 2015-03-03 DIAGNOSIS — K219 Gastro-esophageal reflux disease without esophagitis: Secondary | ICD-10-CM | POA: Insufficient documentation

## 2015-03-03 DIAGNOSIS — R319 Hematuria, unspecified: Secondary | ICD-10-CM | POA: Insufficient documentation

## 2015-03-03 DIAGNOSIS — I10 Essential (primary) hypertension: Secondary | ICD-10-CM | POA: Insufficient documentation

## 2015-03-06 LAB — URINE CULTURE

## 2015-03-09 ENCOUNTER — Other Ambulatory Visit: Payer: Self-pay | Admitting: Physician Assistant

## 2015-03-14 ENCOUNTER — Ambulatory Visit: Payer: Self-pay | Admitting: Physician Assistant

## 2015-03-14 ENCOUNTER — Encounter: Payer: Self-pay | Admitting: Physician Assistant

## 2015-03-14 VITALS — BP 120/68 | HR 92 | Temp 97.9°F | Ht 67.75 in | Wt 169.4 lb

## 2015-03-14 DIAGNOSIS — N3001 Acute cystitis with hematuria: Secondary | ICD-10-CM

## 2015-03-14 DIAGNOSIS — E785 Hyperlipidemia, unspecified: Secondary | ICD-10-CM

## 2015-03-14 DIAGNOSIS — I10 Essential (primary) hypertension: Secondary | ICD-10-CM

## 2015-03-14 DIAGNOSIS — L0390 Cellulitis, unspecified: Secondary | ICD-10-CM

## 2015-03-14 DIAGNOSIS — L0291 Cutaneous abscess, unspecified: Secondary | ICD-10-CM

## 2015-03-14 DIAGNOSIS — F419 Anxiety disorder, unspecified: Secondary | ICD-10-CM

## 2015-03-14 DIAGNOSIS — F17210 Nicotine dependence, cigarettes, uncomplicated: Secondary | ICD-10-CM

## 2015-03-14 DIAGNOSIS — K219 Gastro-esophageal reflux disease without esophagitis: Secondary | ICD-10-CM

## 2015-03-14 MED ORDER — ATORVASTATIN CALCIUM 20 MG PO TABS: 20.0000 mg | ORAL_TABLET | Freq: Every day | ORAL | Status: AC

## 2015-03-14 MED ORDER — SULFAMETHOXAZOLE-TRIMETHOPRIM 800-160 MG PO TABS: 1.0000 | ORAL_TABLET | Freq: Two times a day (BID) | ORAL | Status: AC

## 2015-03-14 NOTE — Progress Notes (Signed)
BP 120/68 mmHg  Pulse 92  Temp(Src) 97.9 F (36.6 C)  Ht 5' 7.75" (1.721 m)  Wt 169 lb 6.4 oz (76.839 kg)  BMI 25.94 kg/m2  SpO2 99%  LMP 08/12/2010   Subjective:    Patient ID: Amber Drake, female    DOB: [birth year], 54 y.o.   MRN: [id number]  HPI: Amber Drake is a 54 y.o. female presenting on 03/14/2015 for Hyperlipidemia and Hypertension   HPI  Chief Complaint  Patient presents with  . Hyperlipidemia    pt states she feels much better compared to last OV!  Marland Kitchen Hypertension  -Pt called cardinal. She was awaiting call back but says she is planning to go to walk in in [place]. -pt also c/o abscess rectum that flared up this past weekend  Relevant past medical, surgical, family and social history reviewed and updated as indicated. Interim medical history since our last visit reviewed. Allergies and medications reviewed and updated.  Current outpatient prescriptions:  .  citalopram (CELEXA) 20 MG tablet, Take 1 tablet (20 mg total) by mouth daily., Disp: 30 tablet, Rfl: 0 .  fluticasone (FLONASE) 50 MCG/ACT nasal spray, Place 2 sprays into both nostrils daily. (Patient taking differently: Place 2 sprays into both nostrils daily as needed for allergies. ), Disp: 16 g, Rfl: 0 .  lisinopril-hydrochlorothiazide (PRINZIDE,ZESTORETIC) 20-12.5 MG tablet, Take 1 tablet by mouth daily., Disp: , Rfl: 1 .  Multiple Vitamin (MULTIVITAMIN WITH MINERALS) TABS, Take 1 tablet by mouth daily., Disp: , Rfl:  .  pantoprazole (PROTONIX) 40 MG tablet, Take 1 tablet (40 mg total) by mouth daily., Disp: 30 tablet, Rfl: 1 .  ranitidine (ZANTAC) 150 MG tablet, Take 150 mg by mouth 2 (two) times daily as needed. , Disp: , Rfl:  .  rosuvastatin (CRESTOR) 20 MG tablet, Take 20 mg by mouth daily., Disp: , Rfl:  .  sodium chloride (OCEAN) 0.65 % SOLN nasal spray, Place 1 spray into both nostrils as needed for congestion., Disp: 480 mL, Rfl: 0   Review of Systems  Constitutional: Negative for  fever, chills, diaphoresis, appetite change, fatigue and unexpected weight change.  HENT: Positive for dental problem. Negative for congestion, drooling, ear pain, facial swelling, hearing loss, mouth sores, sneezing, sore throat, trouble swallowing and voice change.   Eyes: Negative for pain, discharge, redness, itching and visual disturbance.  Respiratory: Negative for cough, choking, shortness of breath and wheezing.   Cardiovascular: Negative for chest pain, palpitations and leg swelling.  Gastrointestinal: Negative for vomiting, abdominal pain, diarrhea, constipation and blood in stool.  Endocrine: Negative for cold intolerance, heat intolerance and polydipsia.  Genitourinary: Negative for dysuria, hematuria and decreased urine volume.  Musculoskeletal: Negative for back pain, arthralgias and gait problem.  Skin: Negative for rash.  Allergic/Immunologic: Negative for environmental allergies.  Neurological: Negative for seizures, syncope, light-headedness and headaches.  Hematological: Negative for adenopathy.  Psychiatric/Behavioral: Negative for suicidal ideas, dysphoric mood and agitation. The patient is not nervous/anxious.     Per HPI unless specifically indicated above     Objective:    BP 120/68 mmHg  Pulse 92  Temp(Src) 97.9 F (36.6 C)  Ht 5' 7.75" (1.721 m)  Wt 169 lb 6.4 oz (76.839 kg)  BMI 25.94 kg/m2  SpO2 99%  LMP 08/12/2010  Wt Readings from Last 3 Encounters:  03/14/15 169 lb 6.4 oz (76.839 kg)  03/02/15 166 lb 3.2 oz (75.388 kg)  03/01/15 166 lb (75.297 kg)    Physical Exam  Constitutional: She  is oriented to person, place, and time. She appears well-developed and well-nourished.  HENT:  Head: Normocephalic and atraumatic.  Neck: Neck supple.  Cardiovascular: Normal rate and regular rhythm.   Pulmonary/Chest: Effort normal and breath sounds normal.  Abdominal: Soft. Bowel sounds are normal. She exhibits no mass. There is no tenderness.  Genitourinary:   Peri-rectal scarring present. Small early abscess- appears as pimple presently with no fluctuance. (nurse Berenice assisted)  Musculoskeletal: She exhibits no edema.  Lymphadenopathy:    She has no cervical adenopathy.  Neurological: She is alert and oriented to person, place, and time.  Skin: Skin is warm and dry.  Psychiatric: She has a normal mood and affect. Her behavior is normal.  Vitals reviewed.   Results for orders placed or performed in visit on 03/02/15  Potassium  Result Value Ref Range   Potassium 3.7 3.5 - 5.3 mmol/L      Assessment & Plan:   Encounter Diagnoses  Name Primary?  . Hyperlipemia Yes  . Essential hypertension, benign   . Gastroesophageal reflux disease, esophagitis presence not specified   . Anxiety disorder, unspecified anxiety disorder type   . Cigarette nicotine dependence, uncomplicated   . Acute cystitis with hematuria   . Cellulitis and abscess     -switch to atorvastatin as medassist no longer carries crestor -Check lipids and cmp -Cont citalopram- go to [place] appt -Counseled smoking cessation -Warm soaks for early abscess.  No I&D indicated at this time. She will RTO if it worsens -reviewed urine culture results with pt -rx septra for uti and abscesses -f/u 3 mo. rto sooner prn

## 2015-03-14 NOTE — Patient Instructions (Signed)
Warm soaks.  Smoking Cessation, Tips for Success If you are ready to quit smoking, congratulations! You have chosen to help yourself be healthier. Cigarettes bring nicotine, tar, carbon monoxide, and other irritants into your body. Your lungs, heart, and blood vessels will be able to work better without these poisons. There are many different ways to quit smoking. Nicotine gum, nicotine patches, a nicotine inhaler, or nicotine nasal spray can help with physical craving. Hypnosis, support groups, and medicines help break the habit of smoking. WHAT THINGS CAN I DO TO MAKE QUITTING EASIER?  Here are some tips to help you quit for good:  Pick a date when you will quit smoking completely. Tell all of your friends and family about your plan to quit on that date.  Do not try to slowly cut down on the number of cigarettes you are smoking. Pick a quit date and quit smoking completely starting on that day.  Throw away all cigarettes.   Clean and remove all ashtrays from your home, work, and car.  On a card, write down your reasons for quitting. Carry the card with you and read it when you get the urge to smoke.  Cleanse your body of nicotine. Drink enough water and fluids to keep your urine clear or pale yellow. Do this after quitting to flush the nicotine from your body.  Learn to predict your moods. Do not let a bad situation be your excuse to have a cigarette. Some situations in your life might tempt you into wanting a cigarette.  Never have "just one" cigarette. It leads to wanting another and another. Remind yourself of your decision to quit.  Change habits associated with smoking. If you smoked while driving or when feeling stressed, try other activities to replace smoking. Stand up when drinking your coffee. Brush your teeth after eating. Sit in a different chair when you read the paper. Avoid alcohol while trying to quit, and try to drink fewer caffeinated beverages. Alcohol and caffeine may  urge you to smoke.  Avoid foods and drinks that can trigger a desire to smoke, such as sugary or spicy foods and alcohol.  Ask people who smoke not to smoke around you.  Have something planned to do right after eating or having a cup of coffee. For example, plan to take a walk or exercise.  Try a relaxation exercise to calm you down and decrease your stress. Remember, you may be tense and nervous for the first 2 weeks after you quit, but this will pass.  Find new activities to keep your hands busy. Play with a pen, coin, or rubber band. Doodle or draw things on paper.  Brush your teeth right after eating. This will help cut down on the craving for the taste of tobacco after meals. You can also try mouthwash.   Use oral substitutes in place of cigarettes. Try using lemon drops, carrots, cinnamon sticks, or chewing gum. Keep them handy so they are available when you have the urge to smoke.  When you have the urge to smoke, try deep breathing.  Designate your home as a nonsmoking area.  If you are a heavy smoker, ask your health care provider about a prescription for nicotine chewing gum. It can ease your withdrawal from nicotine.  Reward yourself. Set aside the cigarette money you save and buy yourself something nice.  Look for support from others. Join a support group or smoking cessation program. Ask someone at home or at work to help you  with your plan to quit smoking.  Always ask yourself, "Do I need this cigarette or is this just a reflex?" Tell yourself, "Today, I choose not to smoke," or "I do not want to smoke." You are reminding yourself of your decision to quit.  Do not replace cigarette smoking with electronic cigarettes (commonly called e-cigarettes). The safety of e-cigarettes is unknown, and some may contain harmful chemicals.  If you relapse, do not give up! Plan ahead and think about what you will do the next time you get the urge to smoke. HOW WILL I FEEL WHEN I QUIT  SMOKING? You may have symptoms of withdrawal because your body is used to nicotine (the addictive substance in cigarettes). You may crave cigarettes, be irritable, feel very hungry, cough often, get headaches, or have difficulty concentrating. The withdrawal symptoms are only temporary. They are strongest when you first quit but will go away within 10-14 days. When withdrawal symptoms occur, stay in control. Think about your reasons for quitting. Remind yourself that these are signs that your body is healing and getting used to being without cigarettes. Remember that withdrawal symptoms are easier to treat than the major diseases that smoking can cause.  Even after the withdrawal is over, expect periodic urges to smoke. However, these cravings are generally short lived and will go away whether you smoke or not. Do not smoke! WHAT RESOURCES ARE AVAILABLE TO HELP ME QUIT SMOKING? Your health care provider can direct you to community resources or hospitals for support, which may include:  Group support.  Education.  Hypnosis.  Therapy.   This information is not intended to replace advice given to you by your health care provider. Make sure you discuss any questions you have with your health care provider.   Document Released: 12/16/2003 Document Revised: 04/09/2014 Document Reviewed: 09/04/2012 Elsevier Interactive Patient Education [employer].

## 2015-04-03 ENCOUNTER — Encounter (HOSPITAL_COMMUNITY): Payer: Self-pay | Admitting: Emergency Medicine

## 2015-04-03 ENCOUNTER — Emergency Department (HOSPITAL_COMMUNITY)
Admission: EM | Admit: 2015-04-03 | Discharge: 2015-04-03 | Disposition: A | Discharge status: Home / Self Care | Payer: Self-pay | Attending: Emergency Medicine | Admitting: Emergency Medicine

## 2015-04-03 DIAGNOSIS — J029 Acute pharyngitis, unspecified: Secondary | ICD-10-CM

## 2015-04-03 DIAGNOSIS — M791 Myalgia: Secondary | ICD-10-CM | POA: Insufficient documentation

## 2015-04-03 DIAGNOSIS — E7800 Pure hypercholesterolemia, unspecified: Secondary | ICD-10-CM | POA: Insufficient documentation

## 2015-04-03 DIAGNOSIS — Z79899 Other long term (current) drug therapy: Secondary | ICD-10-CM | POA: Insufficient documentation

## 2015-04-03 DIAGNOSIS — Z862 Personal history of diseases of the blood and blood-forming organs and certain disorders involving the immune mechanism: Secondary | ICD-10-CM | POA: Insufficient documentation

## 2015-04-03 DIAGNOSIS — Z880 Allergy status to penicillin: Secondary | ICD-10-CM | POA: Insufficient documentation

## 2015-04-03 DIAGNOSIS — I10 Essential (primary) hypertension: Secondary | ICD-10-CM | POA: Insufficient documentation

## 2015-04-03 DIAGNOSIS — F17210 Nicotine dependence, cigarettes, uncomplicated: Secondary | ICD-10-CM | POA: Insufficient documentation

## 2015-04-03 DIAGNOSIS — Z7951 Long term (current) use of inhaled steroids: Secondary | ICD-10-CM | POA: Insufficient documentation

## 2015-04-03 DIAGNOSIS — J069 Acute upper respiratory infection, unspecified: Secondary | ICD-10-CM | POA: Insufficient documentation

## 2015-04-03 MED ORDER — OXYMETAZOLINE HCL 0.05 % NA SOLN
2.0000 | Freq: Once | NASAL | Status: AC
  Administered 2015-04-03: 2 via NASAL
  Filled 2015-04-03: qty 15

## 2015-04-03 MED ORDER — AZITHROMYCIN 250 MG PO TABS: ORAL_TABLET | ORAL | Status: DC

## 2015-04-03 MED ORDER — IBUPROFEN 800 MG PO TABS
800.0000 mg | ORAL_TABLET | Freq: Once | ORAL | Status: AC
  Administered 2015-04-03: 800 mg via ORAL
  Filled 2015-04-03: qty 1

## 2015-04-03 MED ORDER — CLINDAMYCIN HCL 150 MG PO CAPS: 150.0000 mg | ORAL_CAPSULE | Freq: Four times a day (QID) | ORAL | Status: DC

## 2015-04-03 MED ORDER — AZITHROMYCIN 250 MG PO TABS
500.0000 mg | ORAL_TABLET | Freq: Once | ORAL | Status: AC
  Administered 2015-04-03: 500 mg via ORAL
  Filled 2015-04-03: qty 2

## 2015-04-03 NOTE — ED Provider Notes (Signed)
CSN: [id number]     Arrival date & time 04/03/15  2008 History   First MD Initiated Contact with Patient 04/03/15 2226     Chief Complaint  Patient presents with  . URI     (Consider location/radiation/quality/duration/timing/severity/associated sxs/prior Treatment) Patient is a 55 y.o. female presenting with URI.  URI Presenting symptoms: congestion, cough and sore throat   Severity:  Moderate Onset quality:  Gradual Duration:  3 days Timing:  Intermittent Progression:  Worsening Chronicity:  New Relieved by:  Nothing Worsened by:  Nothing tried Associated symptoms: headaches, myalgias and sinus pain   Associated symptoms comment:  Cough Risk factors: sick contacts   Risk factors: no chronic respiratory disease, no diabetes mellitus, no immunosuppression and no recent travel     Past Medical History  Diagnosis Date  . Sickle cell trait ([place])   . Hypertension     does not take meds  . Blood dyscrasia     sickle cell trait  . Hypercholesterolemia    Past Surgical History  Procedure Laterality Date  . Appendectomy    . Incision and drainage perirectal abscess  12/21/09  . Cesarean section      x 2  . Proctoscopy  10/17/2010    Procedure: PROCTOSCOPY;  Surgeon: Scherry Ran;  Location: [facility];  Service: General;  Laterality: N/A;  Rigid Proctoscopy/Possible Fistula in Ano  Procedure ended at 1003  . Therapeutic abortion      x2  . Carpal tunnel release  03/23/2011    Procedure: CARPAL TUNNEL RELEASE;  Surgeon: Sanjuana Kava;  Location: [facility];  Service: Orthopedics;  Laterality: Left;  . Carpal tunnel release  05/10/2011    Procedure: CARPAL TUNNEL RELEASE;  Surgeon: Sanjuana Kava, MD;  Location: [facility];  Service: Orthopedics;  Laterality: Right;   Family History  Problem Relation Age of Onset  . Kidney failure Daughter   . Sickle cell anemia Daughter   . Sickle cell anemia Son   . Anesthesia problems Neg Hx   . Hypotension Neg Hx   . Malignant hyperthermia  Neg Hx   . Pseudochol deficiency Neg Hx    Social History  Substance Use Topics  . Smoking status: Current Every Day Smoker -- 0.50 packs/day for 30 years    Types: Cigarettes  . Smokeless tobacco: None  . Alcohol Use: No   OB History    Gravida Para Term Preterm AB TAB SAB Ectopic Multiple Living   4 2 2  2     2      Review of Systems  HENT: Positive for congestion and sore throat.   Respiratory: Positive for cough.   Musculoskeletal: Positive for myalgias.  Neurological: Positive for headaches.  All other systems reviewed and are negative.     Allergies  Penicillins; Penicillins cross reactors; and Tape  Home Medications   Prior to Admission medications   Medication Sig Start Date End Date Taking? Authorizing Provider  atorvastatin (LIPITOR) 20 MG tablet Take 1 tablet (20 mg total) by mouth daily. 03/14/15  Yes Soyla Dryer, PA-C  citalopram (CELEXA) 20 MG tablet Take 1 tablet (20 mg total) by mouth daily. 03/02/15  Yes Soyla Dryer, PA-C  fluticasone (FLONASE) 50 MCG/ACT nasal spray Place 2 sprays into both nostrils daily. Patient taking differently: Place 2 sprays into both nostrils daily as needed for allergies.  11/16/14  Yes [name], MD  lisinopril-hydrochlorothiazide (PRINZIDE,ZESTORETIC) 20-12.5 MG tablet Take 1 tablet by mouth daily. 12/15/14  Yes Historical Provider,  MD  Multiple Vitamin (MULTIVITAMIN WITH MINERALS) TABS Take 1 tablet by mouth daily.   Yes Historical Provider, MD  pantoprazole (PROTONIX) 40 MG tablet Take 1 tablet (40 mg total) by mouth daily. 03/01/15  Yes Kristen N Ward, DO  ranitidine (ZANTAC) 150 MG tablet Take 150 mg by mouth 2 (two) times daily as needed for heartburn.    Yes Historical Provider, MD  rosuvastatin (CRESTOR) 20 MG tablet Take 20 mg by mouth daily.   Yes Historical Provider, MD  sodium chloride (OCEAN) 0.65 % SOLN nasal spray Place 1 spray into both nostrils as needed for congestion. 11/16/14  Yes [name],  MD  clindamycin (CLEOCIN) 150 MG capsule Take 1 capsule (150 mg total) by mouth every 6 (six) hours. 04/03/15   Lily Kocher, PA-C   BP 128/74 mmHg  Pulse 90  Temp(Src) 99.2 F (37.3 C) (Temporal)  Resp 18  Ht 5\' 4"  (1.626 m)  Wt 79.47 kg  BMI 30.06 kg/m2  SpO2 100%  LMP 08/12/2010 Physical Exam  Constitutional: She is oriented to person, place, and time. She appears well-developed and well-nourished.  Non-toxic appearance.  HENT:  Head: Normocephalic.  Right Ear: Tympanic membrane and external ear normal.  Left Ear: Tympanic membrane and external ear normal.  Mouth/Throat: No oropharyngeal exudate.  Nasal congestion present. Increase redness of the posterior pharynx. Uvula enlarged. No abscess noted. Airway patent.  Eyes: EOM and lids are normal. Pupils are equal, round, and reactive to light.  Neck: Normal range of motion. Neck supple. Carotid bruit is not present.  Cardiovascular: Normal rate, regular rhythm, normal heart sounds, intact distal pulses and normal pulses.   Pulmonary/Chest: Breath sounds normal. No respiratory distress.  Abdominal: Soft. Bowel sounds are normal. There is no tenderness. There is no guarding.  Musculoskeletal: Normal range of motion.  Lymphadenopathy:       Head (right side): No submandibular adenopathy present.       Head (left side): No submandibular adenopathy present.    She has no cervical adenopathy.  Neurological: She is alert and oriented to person, place, and time. She has normal strength. No cranial nerve deficit or sensory deficit.  Skin: Skin is warm and dry. No rash noted.  Psychiatric: She has a normal mood and affect. Her speech is normal.  Nursing note and vitals reviewed.   ED Course  Procedures (including critical care time) Labs Review Labs Reviewed - No data to display  Imaging Review No results found. I have personally reviewed and evaluated these images and lab results as part of my medical decision-making.   EKG  Interpretation None      MDM  Exam favors pharyngitis and URI. Pt to increase fluids.  Use tylenol or ibuprofen for fever or aching. I have suggested salt water gargles, clindamycin, and afrin for congestion. Pt to return if not improving.   Final diagnoses:  Pharyngitis    **I have reviewed nursing notes, vital signs, and all appropriate lab and imaging results for this patient.Lily Kocher, PA-C 04/03/15 [id number]  Daleen Bo, MD 04/04/15 1140

## 2015-04-03 NOTE — ED Notes (Signed)
Cough, cold, headache, eyes red, body aches

## 2015-04-03 NOTE — Discharge Instructions (Signed)
Please use afrin spray every 8 hours. Increase water and gatorade. Use tylenol or ibuprofen for fever and pain. Use clindamycin four times daily with food. See Dr Roslynn Amble for recheck if not improving. Pharyngitis Pharyngitis is a sore throat (pharynx). There is redness, pain, and swelling of your throat. HOME CARE   Drink enough fluids to keep your pee (urine) clear or pale yellow.  Only take medicine as told by your doctor.  You may get sick again if you do not take medicine as told. Finish your medicines, even if you start to feel better.  Do not take aspirin.  Rest.  Rinse your mouth (gargle) with salt water ( tsp of salt per 1 qt of water) every 1-2 hours. This will help the pain.  If you are not at risk for choking, you can suck on hard candy or sore throat lozenges. GET HELP IF:  You have large, tender lumps on your neck.  You have a rash.  You cough up green, yellow-brown, or bloody spit. GET HELP RIGHT AWAY IF:   You have a stiff neck.  You drool or cannot swallow liquids.  You throw up (vomit) or are not able to keep medicine or liquids down.  You have very bad pain that does not go away with medicine.  You have problems breathing (not from a stuffy nose). MAKE SURE YOU:   Understand these instructions.  Will watch your condition.  Will get help right away if you are not doing well or get worse.   This information is not intended to replace advice given to you by your health care provider. Make sure you discuss any questions you have with your health care provider.   Document Released: 09/05/2007 Document Revised: 01/07/2013 Document Reviewed: 11/24/2012 Elsevier Interactive Patient Education 2016 [employer]  Upper Respiratory Infection, Adult Most upper respiratory infections (URIs) are caused by a virus. A URI affects the nose, throat, and upper air passages. The most common type of URI is often called "the common cold." HOME CARE   Take medicines  only as told by your doctor.  Gargle warm saltwater or take cough drops to comfort your throat as told by your doctor.  Use a warm mist humidifier or inhale steam from a shower to increase air moisture. This may make it easier to breathe.  Drink enough fluid to keep your pee (urine) clear or pale yellow.  Eat soups and other clear broths.  Have a healthy diet.  Rest as needed.  Go back to work when your fever is gone or your doctor says it is okay.  You may need to stay home longer to avoid giving your URI to others.  You can also wear a face mask and wash your hands often to prevent spread of the virus.  Use your inhaler more if you have asthma.  Do not use any tobacco products, including cigarettes, chewing tobacco, or electronic cigarettes. If you need help quitting, ask your doctor. GET HELP IF:  You are getting worse, not better.  Your symptoms are not helped by medicine.  You have chills.  You are getting more short of breath.  You have brown or red mucus.  You have yellow or brown discharge from your nose.  You have pain in your face, especially when you bend forward.  You have a fever.  You have puffy (swollen) neck glands.  You have pain while swallowing.  You have white areas in the back of your throat. GET  HELP RIGHT AWAY IF:   You have very bad or constant:  Headache.  Ear pain.  Pain in your forehead, behind your eyes, and over your cheekbones (sinus pain).  Chest pain.  You have long-lasting (chronic) lung disease and any of the following:  Wheezing.  Long-lasting cough.  Coughing up blood.  A change in your usual mucus.  You have a stiff neck.  You have changes in your:  Vision.  Hearing.  Thinking.  Mood. MAKE SURE YOU:   Understand these instructions.  Will watch your condition.  Will get help right away if you are not doing well or get worse.   This information is not intended to replace advice given to you by  your health care provider. Make sure you discuss any questions you have with your health care provider.   Document Released: 09/05/2007 Document Revised: 08/03/2014 Document Reviewed: 06/24/2013 Elsevier Interactive Patient Education [employer].

## 2015-04-18 ENCOUNTER — Encounter (HOSPITAL_COMMUNITY): Payer: Self-pay | Admitting: Emergency Medicine

## 2015-04-18 DIAGNOSIS — L02411 Cutaneous abscess of right axilla: Secondary | ICD-10-CM | POA: Insufficient documentation

## 2015-04-18 DIAGNOSIS — I10 Essential (primary) hypertension: Secondary | ICD-10-CM | POA: Diagnosis not present

## 2015-04-18 DIAGNOSIS — F17210 Nicotine dependence, cigarettes, uncomplicated: Secondary | ICD-10-CM | POA: Insufficient documentation

## 2015-04-18 DIAGNOSIS — Z7951 Long term (current) use of inhaled steroids: Secondary | ICD-10-CM | POA: Insufficient documentation

## 2015-04-18 DIAGNOSIS — Z880 Allergy status to penicillin: Secondary | ICD-10-CM | POA: Insufficient documentation

## 2015-04-18 DIAGNOSIS — Z792 Long term (current) use of antibiotics: Secondary | ICD-10-CM | POA: Diagnosis not present

## 2015-04-18 DIAGNOSIS — E7800 Pure hypercholesterolemia, unspecified: Secondary | ICD-10-CM | POA: Diagnosis not present

## 2015-04-18 DIAGNOSIS — Z862 Personal history of diseases of the blood and blood-forming organs and certain disorders involving the immune mechanism: Secondary | ICD-10-CM | POA: Insufficient documentation

## 2015-04-18 NOTE — ED Notes (Signed)
Pt states abscess under right area, and left groin

## 2015-04-19 ENCOUNTER — Emergency Department (HOSPITAL_COMMUNITY)
Admission: EM | Admit: 2015-04-19 | Discharge: 2015-04-19 | Disposition: A | Discharge status: Home / Self Care | Payer: BLUE CROSS/BLUE SHIELD | Attending: Emergency Medicine | Admitting: Emergency Medicine

## 2015-04-19 DIAGNOSIS — L02411 Cutaneous abscess of right axilla: Secondary | ICD-10-CM

## 2015-04-19 MED ORDER — SULFAMETHOXAZOLE-TRIMETHOPRIM 800-160 MG PO TABS: 2.0000 | ORAL_TABLET | Freq: Two times a day (BID) | ORAL | Status: AC

## 2015-04-19 MED ORDER — SULFAMETHOXAZOLE-TRIMETHOPRIM 800-160 MG PO TABS
2.0000 | ORAL_TABLET | Freq: Once | ORAL | Status: AC
  Administered 2015-04-19: 2 via ORAL
  Filled 2015-04-19: qty 2

## 2015-04-19 MED ORDER — LIDOCAINE HCL (PF) 2 % IJ SOLN
10.0000 mL | Freq: Once | INTRAMUSCULAR | Status: AC
  Administered 2015-04-19: 10 mL
  Filled 2015-04-19: qty 10

## 2015-04-19 NOTE — Discharge Instructions (Signed)
Abscess °An abscess is an infected area that contains a collection of pus and debris. It can occur in almost any part of the body. An abscess is also known as a furuncle or boil. °CAUSES  °An abscess occurs when tissue gets infected. This can occur from blockage of oil or sweat glands, infection of hair follicles, or a minor injury to the skin. As the body tries to fight the infection, pus collects in the area and creates pressure under the skin. This pressure causes pain. People with weakened immune systems have difficulty fighting infections and get certain abscesses more often.  °SYMPTOMS °Usually an abscess develops on the skin and becomes a painful mass that is red, warm, and tender. If the abscess forms under the skin, you may feel a moveable soft area under the skin. Some abscesses break open (rupture) on their own, but most will continue to get worse without care. The infection can spread deeper into the body and eventually into the bloodstream, causing you to feel ill.  °DIAGNOSIS  °Your caregiver will take your medical history and perform a physical exam. A sample of fluid may also be taken from the abscess to determine what is causing your infection. °TREATMENT  °Your caregiver may prescribe antibiotic medicines to fight the infection. However, taking antibiotics alone usually does not cure an abscess. Your caregiver may need to make a small cut (incision) in the abscess to drain the pus. In some cases, gauze is packed into the abscess to reduce pain and to continue draining the area. °HOME CARE INSTRUCTIONS  °· Only take over-the-counter or prescription medicines for pain, discomfort, or fever as directed by your caregiver. °· If you were prescribed antibiotics, take them as directed. Finish them even if you start to feel better. °· If gauze is used, follow your caregiver's directions for changing the gauze. °· To avoid spreading the infection: °· Keep your draining abscess covered with a  bandage. °· Wash your hands well. °· Do not share personal care items, towels, or whirlpools with others. °· Avoid skin contact with others. °· Keep your skin and clothes clean around the abscess. °· Keep all follow-up appointments as directed by your caregiver. °SEEK MEDICAL CARE IF:  °· You have increased pain, swelling, redness, fluid drainage, or bleeding. °· You have muscle aches, chills, or a general ill feeling. °· You have a fever. °MAKE SURE YOU:  °· Understand these instructions. °· Will watch your condition. °· Will get help right away if you are not doing well or get worse. °  °This information is not intended to replace advice given to you by your health care provider. Make sure you discuss any questions you have with your health care provider. °  °Document Released: 12/27/2004 Document Revised: 09/18/2011 Document Reviewed: 06/01/2011 °Elsevier Interactive Patient Education ©2016 [employer] ° °Incision and Drainage °Incision and drainage is a procedure in which a sac-like structure (cystic structure) is opened and drained. The area to be drained usually contains material such as pus, fluid, or blood.  °LET YOUR CAREGIVER KNOW ABOUT:  °· Allergies to medicine. °· Medicines taken, including vitamins, herbs, eyedrops, over-the-counter medicines, and creams. °· Use of steroids (by mouth or creams). °· Previous problems with anesthetics or numbing medicines. °· History of bleeding problems or blood clots. °· Previous surgery. °· Other health problems, including diabetes and kidney problems. °· Possibility of pregnancy, if this applies. °RISKS AND COMPLICATIONS °· Pain. °· Bleeding. °· Scarring. °· Infection. °BEFORE THE PROCEDURE  °  You may need to have an ultrasound or other imaging tests to see how large or deep your cystic structure is. Blood tests may also be used to determine if you have an infection or how severe the infection is. You may need to have a tetanus shot. °PROCEDURE  °The affected area  is cleaned with a cleaning fluid. The cyst area will then be numbed with a medicine (local anesthetic). A small incision will be made in the cystic structure. A syringe or catheter may be used to drain the contents of the cystic structure, or the contents may be squeezed out. The area will then be flushed with a cleansing solution. After cleansing the area, it is often gently packed with a gauze or another wound dressing. Once it is packed, it will be covered with gauze and tape or some other type of wound dressing.  °AFTER THE PROCEDURE  °· Often, you will be allowed to go home right after the procedure. °· You may be given antibiotic medicine to prevent or heal an infection. °· If the area was packed with gauze or some other wound dressing, you will likely need to come back in 1 to 2 days to get it removed. °· The area should heal in about 14 days. °  °This information is not intended to replace advice given to you by your health care provider. Make sure you discuss any questions you have with your health care provider. °  °Document Released: 09/12/2000 Document Revised: 09/18/2011 Document Reviewed: 05/14/2011 °Elsevier Interactive Patient Education ©2016 [employer] Inc[employer] ° °

## 2015-04-19 NOTE — ED Notes (Signed)
Pt states understanding of care given and follow up instructions 

## 2015-04-19 NOTE — ED Provider Notes (Signed)
CSN: [id number]     Arrival date & time 04/18/15  2322 History  By signing my name below, I, [facility], attest that this documentation has been prepared under the direction and in the presence of Orpah Greek, MD. Electronically Signed: Virgel Bouquet, ED Scribe. 04/19/2015. 12:50 AM.   Chief Complaint  Patient presents with  . Abscess   The history is provided by the patient. No language interpreter was used.   HPI Comments: Amber Drake is a 55 y.o. female who presents to the Emergency Department complaining of constant, mildly painful, gradually worsening abscess on the right axilla that appeared 2 days ago. She notes similar abscesses in the past that resolved with I&D. Patient endorses similar smaller bumps on her groin that appeared simultaneously with the abscess on the arm. She denies any other complaints currently.  Past Medical History  Diagnosis Date  . Sickle cell trait ([place])   . Hypertension     does not take meds  . Blood dyscrasia     sickle cell trait  . Hypercholesterolemia    Past Surgical History  Procedure Laterality Date  . Appendectomy    . Incision and drainage perirectal abscess  12/21/09  . Cesarean section      x 2  . Proctoscopy  10/17/2010    Procedure: PROCTOSCOPY;  Surgeon: Scherry Ran;  Location: [facility];  Service: General;  Laterality: N/A;  Rigid Proctoscopy/Possible Fistula in Ano  Procedure ended at 1003  . Therapeutic abortion      x2  . Carpal tunnel release  03/23/2011    Procedure: CARPAL TUNNEL RELEASE;  Surgeon: Sanjuana Kava;  Location: [facility];  Service: Orthopedics;  Laterality: Left;  . Carpal tunnel release  05/10/2011    Procedure: CARPAL TUNNEL RELEASE;  Surgeon: Sanjuana Kava, MD;  Location: [facility];  Service: Orthopedics;  Laterality: Right;   Family History  Problem Relation Age of Onset  . Kidney failure Daughter   . Sickle cell anemia Daughter   . Sickle cell anemia Son   . Anesthesia problems  Neg Hx   . Hypotension Neg Hx   . Malignant hyperthermia Neg Hx   . Pseudochol deficiency Neg Hx    Social History  Substance Use Topics  . Smoking status: Current Every Day Smoker -- 0.50 packs/day for 30 years    Types: Cigarettes  . Smokeless tobacco: None  . Alcohol Use: No   OB History    Gravida Para Term Preterm AB TAB SAB Ectopic Multiple Living   4 2 2  2     2      Review of Systems  Skin: Positive for wound (Abscess on right axilla; small bumps on the groin).  All other systems reviewed and are negative.     Allergies  Penicillins; Penicillins cross reactors; and Tape  Home Medications   Prior to Admission medications   Medication Sig Start Date End Date Taking? Authorizing Provider  atorvastatin (LIPITOR) 20 MG tablet Take 1 tablet (20 mg total) by mouth daily. 03/14/15   Soyla Dryer, PA-C  citalopram (CELEXA) 20 MG tablet Take 1 tablet (20 mg total) by mouth daily. 03/02/15   Soyla Dryer, PA-C  clindamycin (CLEOCIN) 150 MG capsule Take 1 capsule (150 mg total) by mouth every 6 (six) hours. 04/03/15   Lily Kocher, PA-C  fluticasone (FLONASE) 50 MCG/ACT nasal spray Place 2 sprays into both nostrils daily. Patient taking differently: Place 2 sprays into both nostrils daily as needed for allergies.  11/16/14   [name], MD  lisinopril-hydrochlorothiazide (PRINZIDE,ZESTORETIC) 20-12.5 MG tablet Take 1 tablet by mouth daily. 12/15/14   Historical Provider, MD  Multiple Vitamin (MULTIVITAMIN WITH MINERALS) TABS Take 1 tablet by mouth daily.    Historical Provider, MD  pantoprazole (PROTONIX) 40 MG tablet Take 1 tablet (40 mg total) by mouth daily. 03/01/15   Kristen N Ward, DO  ranitidine (ZANTAC) 150 MG tablet Take 150 mg by mouth 2 (two) times daily as needed for heartburn.     Historical Provider, MD  rosuvastatin (CRESTOR) 20 MG tablet Take 20 mg by mouth daily.    Historical Provider, MD  sodium chloride (OCEAN) 0.65 % SOLN nasal spray Place 1 spray  into both nostrils as needed for congestion. 11/16/14   [name], MD   BP 109/71 mmHg  Pulse 91  Temp(Src) 98.6 F (37 C) (Temporal)  Resp 20  Ht 5\' 8"  (1.727 m)  Wt 171 lb (77.565 kg)  BMI 26.01 kg/m2  SpO2 100%  LMP 08/12/2010 Physical Exam  Constitutional: She is oriented to person, place, and time. She appears well-developed and well-nourished. No distress.  HENT:  Head: Normocephalic and atraumatic.  Right Ear: Hearing normal.  Left Ear: Hearing normal.  Nose: Nose normal.  Mouth/Throat: Oropharynx is clear and moist and mucous membranes are normal.  Eyes: Conjunctivae and EOM are normal. Pupils are equal, round, and reactive to light.  Neck: Normal range of motion. Neck supple.  Cardiovascular: Regular rhythm, S1 normal and S2 normal.  Exam reveals no gallop and no friction rub.   No murmur heard. Pulmonary/Chest: Effort normal and breath sounds normal. No respiratory distress. She exhibits no tenderness.  Abdominal: Soft. Normal appearance and bowel sounds are normal. There is no hepatosplenomegaly. There is no tenderness. There is no rebound, no guarding, no tenderness at McBurney's point and negative Murphy's sign. No hernia.  Musculoskeletal: Normal range of motion.  Neurological: She is alert and oriented to person, place, and time. She has normal strength. No cranial nerve deficit or sensory deficit. Coordination normal. GCS eye subscore is 4. GCS verbal subscore is 5. GCS motor subscore is 6.  Skin: Skin is warm, dry and intact. Lesion noted. No rash noted. No cyanosis.  1x1.5 cm tender lesion to the right axilla. Several small papules in the inguinal area.  Psychiatric: She has a normal mood and affect. Her speech is normal and behavior is normal. Thought content normal.  Nursing note and vitals reviewed.   ED Course  Procedures   INCISION AND DRAINAGE Performed by: Orpah Greek. Consent: Verbal consent obtained. Risks and benefits: risks,  benefits and alternatives were discussed Type: abscess  Body area: axilla  Anesthesia: local infiltration  Incision was made with a scalpel.  Local anesthetic: lidocaine 2% w/o epinephrine  Anesthetic total: 1 ml  Complexity: complex Blunt dissection to break up loculations  Drainage: purulent  Drainage amount: moderate  Packing material: none  Patient tolerance: Patient tolerated the procedure well with no immediate complications.    DIAGNOSTIC STUDIES: Oxygen Saturation is 100% on RA, normal by my interpretation.    COORDINATION OF CARE: 12:42 AM Performed I&D. Will prescribe antibiotics. Discussed treatment plan with pt at bedside and pt agreed to plan.  Labs Review Labs Reviewed - No data to display  Imaging Review No results found. I have personally reviewed and evaluated these images and lab results as part of my medical decision-making.   EKG Interpretation None  MDM   Final diagnoses:  Cutaneous abscess of right axilla    Patient presents to the ER for evaluation of abscess. Patient has abscess in her right axilla that required drainage as outlined above. Patient has multiple other small areas of what appears to be folliculitis in the groin area that does not require drainage. She will be treated with antibiotic coverage.  I personally performed the services described in this documentation, which was scribed in my presence. The recorded information has been reviewed and is accurate.   Orpah Greek, MD 04/19/15 [id number]

## 2015-04-26 ENCOUNTER — Emergency Department (HOSPITAL_COMMUNITY)
Admission: EM | Admit: 2015-04-26 | Discharge: 2015-04-27 | Disposition: A | Discharge status: Home / Self Care | Payer: BLUE CROSS/BLUE SHIELD | Attending: Emergency Medicine | Admitting: Emergency Medicine

## 2015-04-26 ENCOUNTER — Encounter (HOSPITAL_COMMUNITY): Payer: Self-pay | Admitting: Emergency Medicine

## 2015-04-26 ENCOUNTER — Emergency Department (HOSPITAL_COMMUNITY): Payer: BLUE CROSS/BLUE SHIELD

## 2015-04-26 DIAGNOSIS — Z79899 Other long term (current) drug therapy: Secondary | ICD-10-CM | POA: Insufficient documentation

## 2015-04-26 DIAGNOSIS — R195 Other fecal abnormalities: Secondary | ICD-10-CM | POA: Diagnosis not present

## 2015-04-26 DIAGNOSIS — R1013 Epigastric pain: Secondary | ICD-10-CM | POA: Diagnosis present

## 2015-04-26 DIAGNOSIS — K5900 Constipation, unspecified: Secondary | ICD-10-CM | POA: Diagnosis not present

## 2015-04-26 DIAGNOSIS — Z880 Allergy status to penicillin: Secondary | ICD-10-CM | POA: Diagnosis not present

## 2015-04-26 DIAGNOSIS — E7800 Pure hypercholesterolemia, unspecified: Secondary | ICD-10-CM | POA: Diagnosis not present

## 2015-04-26 DIAGNOSIS — Z87891 Personal history of nicotine dependence: Secondary | ICD-10-CM | POA: Diagnosis not present

## 2015-04-26 DIAGNOSIS — I10 Essential (primary) hypertension: Secondary | ICD-10-CM | POA: Insufficient documentation

## 2015-04-26 DIAGNOSIS — R110 Nausea: Secondary | ICD-10-CM | POA: Insufficient documentation

## 2015-04-26 DIAGNOSIS — Z862 Personal history of diseases of the blood and blood-forming organs and certain disorders involving the immune mechanism: Secondary | ICD-10-CM | POA: Diagnosis not present

## 2015-04-26 DIAGNOSIS — Z7951 Long term (current) use of inhaled steroids: Secondary | ICD-10-CM | POA: Diagnosis not present

## 2015-04-26 LAB — CBC WITH DIFFERENTIAL/PLATELET
BASOS ABS: 0 10*3/uL (ref 0.0–0.1)
BASOS PCT: 0 %
EOS ABS: 0.1 10*3/uL (ref 0.0–0.7)
EOS PCT: 1 %
HCT: 29.4 % — ABNORMAL LOW (ref 36.0–46.0)
HEMOGLOBIN: 10.1 g/dL — AB (ref 12.0–15.0)
Lymphocytes Relative: 40 %
Lymphs Abs: 2.6 10*3/uL (ref 0.7–4.0)
MCH: 28.9 pg (ref 26.0–34.0)
MCHC: 34.4 g/dL (ref 30.0–36.0)
MCV: 84 fL (ref 78.0–100.0)
Monocytes Absolute: 0.4 10*3/uL (ref 0.1–1.0)
Monocytes Relative: 7 %
NEUTROS PCT: 52 %
Neutro Abs: 3.3 10*3/uL (ref 1.7–7.7)
PLATELETS: 343 10*3/uL (ref 150–400)
RBC: 3.5 MIL/uL — AB (ref 3.87–5.11)
RDW: 12.8 % (ref 11.5–15.5)
WBC: 6.4 10*3/uL (ref 4.0–10.5)

## 2015-04-26 IMAGING — DX DG ABDOMEN 2V
2 series · 2 of 2 positions shown · non-contrast
Comparison: CT abdomen and pelvis [DATE]. Abdominal series

CLINICAL DATA: Intermittent sharp epigastric abdominal pain
beginning yesterday. Nausea, constipation, blood in stool. History
of hypertension, sickle cell, appendectomy, C-sections, former
smoker.

EXAM:
ABDOMEN - 2 VIEW

[abdomen erect]
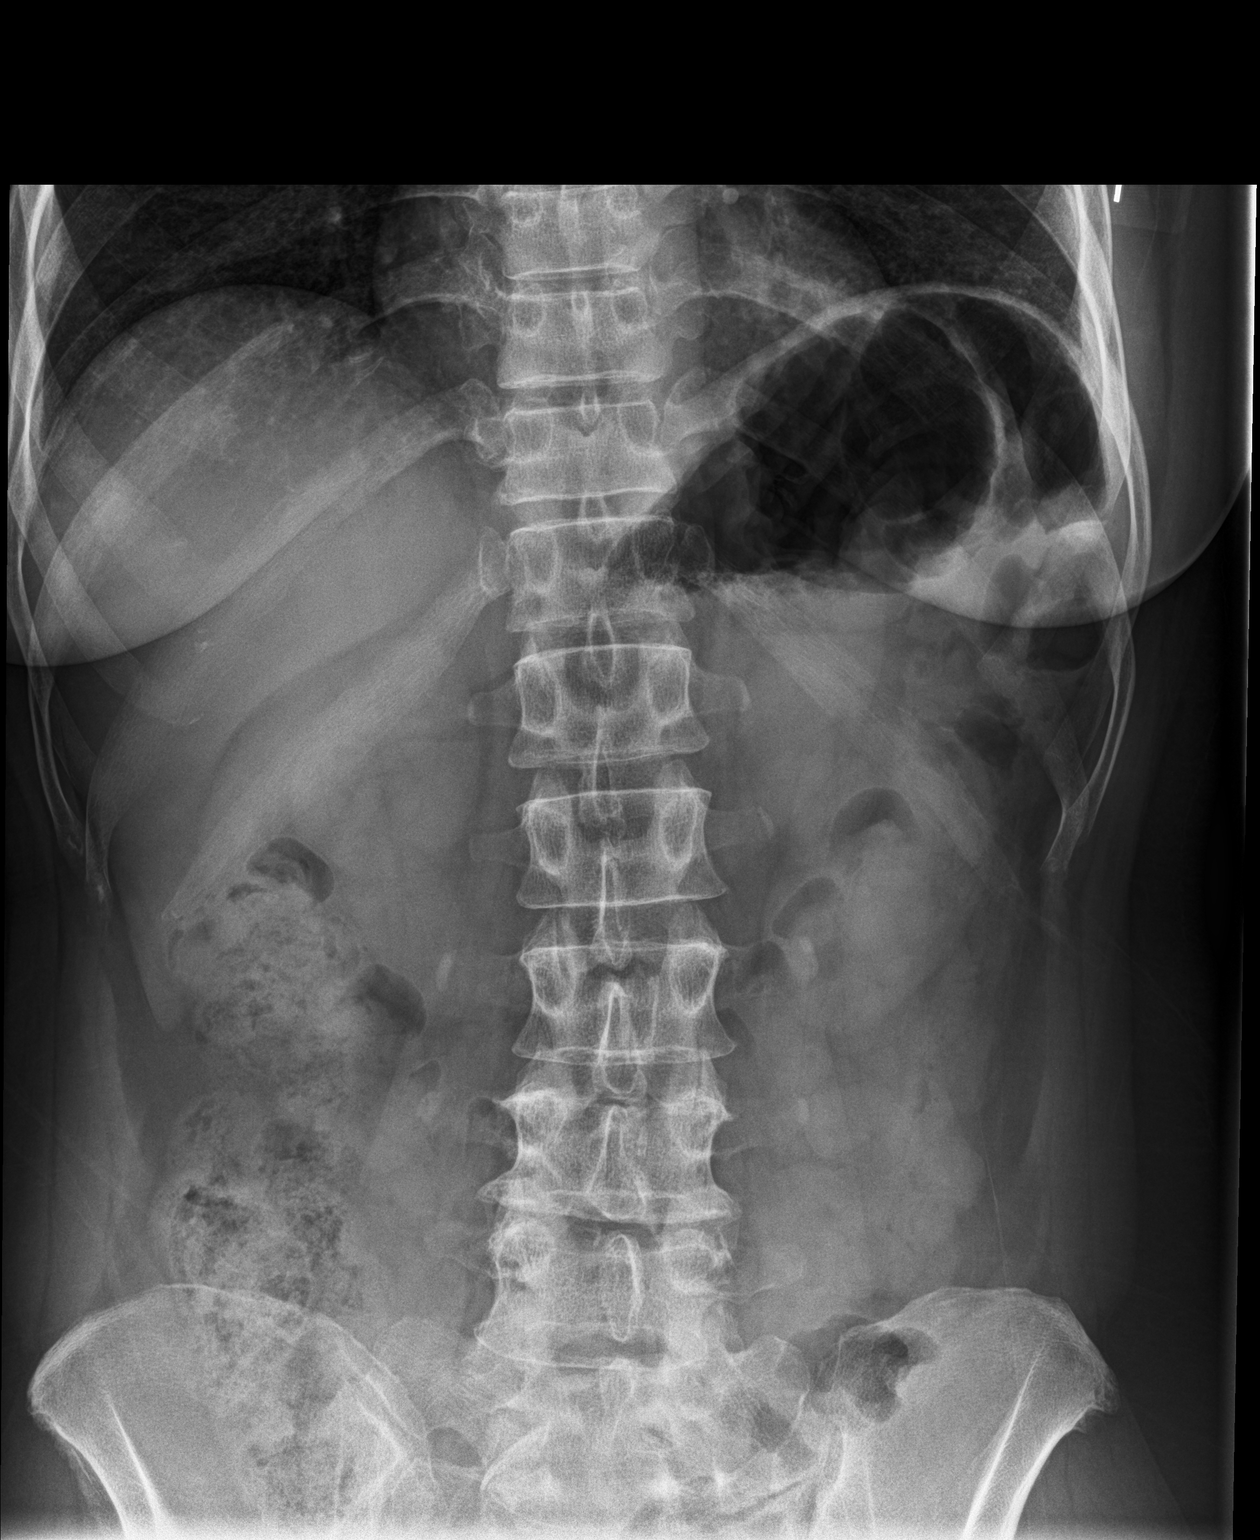

[abdomen supine]
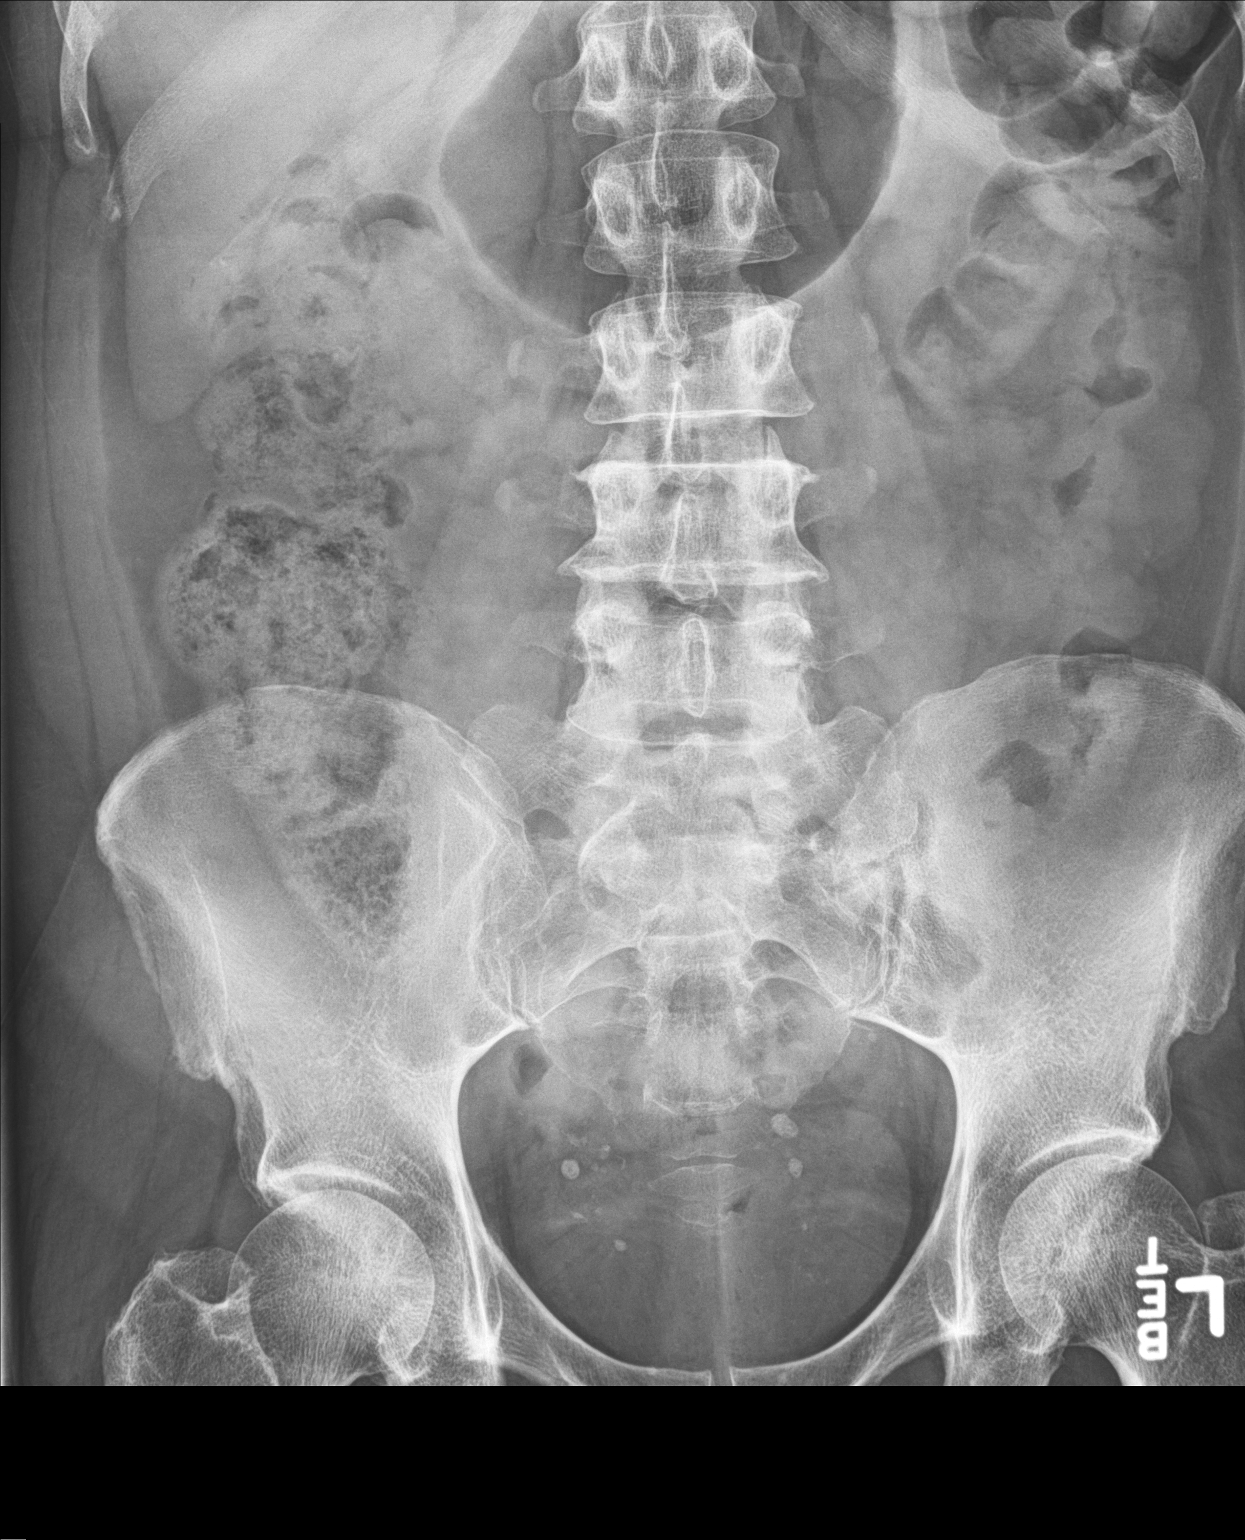

[2 of 2 positions shown; findings below may reference images not displayed]

FINDINGS: Gas and stool throughout the colon. No small or large bowel
distention. No free intra-abdominal air. No abnormal air-fluid
levels. No radiopaque stones. Visualized bones appear intact.
Calcified phleboliths in the pelvis.
IMPRESSION: Normal nonobstructive bowel gas pattern.  Stool-filled colon.

## 2015-04-26 NOTE — ED Provider Notes (Signed)
CSN: [id number]     Arrival date & time 04/26/15  2158 History  By signing my name below, I, [facility], attest that this documentation has been prepared under the direction and in the presence of Forde Dandy, MD. Electronically Signed: Virgel Bouquet, ED Scribe. 04/26/2015. 1:34 AM.   Chief Complaint  Patient presents with  . Abdominal Pain   The history is provided by the patient. No language interpreter was used.   HPI Comments: Amber Drake is a 55 y.o. female with an PMHx of sick cell trait, HTN, and hypercholesterolemia and a PSHx of appendectomy and 2x cesarean section who presents to the Emergency Department complaining of intermittent, nonradiating, epigastric abdominal pain that began yesterday. She endorses nausea, constipation, and bright red blood in her stool. She has taken Miralax with relief. She states that she has been regularly straining during BMs before. Passing gas. Per patient, she has had a colonoscopy in the past and reports an hx of hemorrhoids. She denies fever, vomiting, chills, dysuria, urinary frequency, or flank pain.   Past Medical History  Diagnosis Date  . Sickle cell trait ([place])   . Hypertension     does not take meds  . Blood dyscrasia     sickle cell trait  . Hypercholesterolemia    Past Surgical History  Procedure Laterality Date  . Appendectomy    . Incision and drainage perirectal abscess  12/21/09  . Cesarean section      x 2  . Proctoscopy  10/17/2010    Procedure: PROCTOSCOPY;  Surgeon: Scherry Ran;  Location: [facility];  Service: General;  Laterality: N/A;  Rigid Proctoscopy/Possible Fistula in Ano  Procedure ended at 1003  . Therapeutic abortion      x2  . Carpal tunnel release  03/23/2011    Procedure: CARPAL TUNNEL RELEASE;  Surgeon: Sanjuana Kava;  Location: [facility];  Service: Orthopedics;  Laterality: Left;  . Carpal tunnel release  05/10/2011    Procedure: CARPAL TUNNEL RELEASE;  Surgeon: Sanjuana Kava, MD;   Location: [facility];  Service: Orthopedics;  Laterality: Right;   Family History  Problem Relation Age of Onset  . Kidney failure Daughter   . Sickle cell anemia Daughter   . Sickle cell anemia Son   . Anesthesia problems Neg Hx   . Hypotension Neg Hx   . Malignant hyperthermia Neg Hx   . Pseudochol deficiency Neg Hx    Social History  Substance Use Topics  . Smoking status: Former Smoker -- 0.50 packs/day for 30 years    Types: Cigarettes    Quit date: 04/21/2015  . Smokeless tobacco: None  . Alcohol Use: No   OB History    Gravida Para Term Preterm AB TAB SAB Ectopic Multiple Living   4 2 2  2     2      Review of Systems  Constitutional: Negative for fever.  Gastrointestinal: Positive for nausea, abdominal pain, constipation and blood in stool.  All other systems reviewed and are negative.     Allergies  Penicillins; Penicillins cross reactors; and Tape  Home Medications   Prior to Admission medications   Medication Sig Start Date End Date Taking? Authorizing Provider  atorvastatin (LIPITOR) 20 MG tablet Take 1 tablet (20 mg total) by mouth daily. 03/14/15  Yes Soyla Dryer, PA-C  citalopram (CELEXA) 20 MG tablet Take 1 tablet (20 mg total) by mouth daily. 03/02/15  Yes Soyla Dryer, PA-C  fluticasone (FLONASE) 50 MCG/ACT nasal spray Place  2 sprays into both nostrils daily. Patient taking differently: Place 2 sprays into both nostrils daily as needed for allergies.  11/16/14  Yes [name], MD  HYDROcodone-acetaminophen (NORCO) 7.5-325 MG tablet Take 1 tablet by mouth 4 (four) times daily as needed for moderate pain or severe pain.  04/21/15  Yes Historical Provider, MD  lisinopril-hydrochlorothiazide (PRINZIDE,ZESTORETIC) 20-12.5 MG tablet Take 1 tablet by mouth daily. 12/15/14  Yes Historical Provider, MD  Multiple Vitamin (MULTIVITAMIN WITH MINERALS) TABS Take 1 tablet by mouth daily.   Yes Historical Provider, MD  pantoprazole (PROTONIX) 40 MG tablet Take  1 tablet (40 mg total) by mouth daily. 03/01/15  Yes Kristen N Ward, DO  polyethylene glycol powder (GLYCOLAX/MIRALAX) powder Take 17 g by mouth daily as needed for mild constipation or moderate constipation.   Yes Historical Provider, MD  ranitidine (ZANTAC) 150 MG tablet Take 150 mg by mouth 2 (two) times daily as needed for heartburn.    Yes Historical Provider, MD  sodium chloride (OCEAN) 0.65 % SOLN nasal spray Place 1 spray into both nostrils as needed for congestion. 11/16/14  Yes [name], MD  clindamycin (CLEOCIN) 150 MG capsule Take 1 capsule (150 mg total) by mouth every 6 (six) hours. Patient not taking: Reported on 04/26/2015 04/03/15   Lily Kocher, PA-C   BP 118/61 mmHg  Pulse 77  Temp(Src) 97.7 F (36.5 C) (Oral)  Resp 16  Ht 5\' 9"  (1.753 m)  Wt 171 lb (77.565 kg)  BMI 25.24 kg/m2  SpO2 100%  LMP 08/12/2010 Physical Exam Physical Exam  Nursing note and vitals reviewed. Constitutional: Well developed, well nourished, non-toxic, and in no acute distress Head: Normocephalic and atraumatic.  Mouth/Throat: Oropharynx is clear and moist.  Neck: Normal range of motion. Neck supple.  Cardiovascular: Normal rate and regular rhythm.   Pulmonary/Chest: Effort normal and breath sounds normal.  Abdominal: Soft. There is no rebound and no guarding. Minimal epigastric tenderness to palpation. Non-distended. Musculoskeletal: Normal range of motion.  Neurological: Alert, no facial droop, fluent speech, moves all extremities symmetrically Skin: Skin is warm and dry.  Psychiatric: Cooperative  ED Course  Procedures  DIAGNOSTIC STUDIES: Oxygen Saturation is 100% on RA, normal by my interpretation.    COORDINATION OF CARE: 10:54 PM Discussed treatment plan with pt at bedside and pt agreed to plan.  Labs Review Labs Reviewed  CBC WITH DIFFERENTIAL/PLATELET - Abnormal; Notable for the following:    RBC 3.50 (*)    Hemoglobin 10.1 (*)    HCT 29.4 (*)    All other  components within normal limits  COMPREHENSIVE METABOLIC PANEL - Abnormal; Notable for the following:    Sodium 132 (*)    Glucose, Bld 112 (*)    BUN 38 (*)    Creatinine, Ser 1.92 (*)    ALT 11 (*)    GFR calc non Af Amer 28 (*)    GFR calc Af Amer 33 (*)    All other components within normal limits  URINALYSIS, ROUTINE W REFLEX MICROSCOPIC (NOT AT [facility]) - Abnormal; Notable for the following:    Hgb urine dipstick LARGE (*)    All other components within normal limits  URINE MICROSCOPIC-ADD ON - Abnormal; Notable for the following:    Squamous Epithelial / LPF 0-5 (*)    Bacteria, UA FEW (*)    All other components within normal limits  LIPASE, BLOOD    Imaging Review Dg Abd 2 Views  04/27/2015  CLINICAL DATA:  Intermittent sharp epigastric abdominal pain beginning yesterday. Nausea, constipation, blood in stool. History of hypertension, sickle cell, appendectomy, C-sections, former smoker. EXAM: ABDOMEN - 2 VIEW COMPARISON:  CT abdomen and pelvis 03/01/2015. Abdominal series [id number] FINDINGS: Gas and stool throughout the colon. No small or large bowel distention. No free intra-abdominal air. No abnormal air-fluid levels. No radiopaque stones. Visualized bones appear intact. Calcified phleboliths in the pelvis. IMPRESSION: Normal nonobstructive bowel gas pattern.  Stool-filled colon. Electronically Signed   By: Lucienne Capers M.D.   On: 04/27/2015 00:12   I have personally reviewed and evaluated these images and lab results as part of my medical decision-making.   EKG Interpretation None      MDM   Final diagnoses:  Constipation  Epigastric pain    55 year old female who presents with intermittent upper abdominal pain for one day. Well appearing on arrival with non-concerning vital signs. On exam abdomen is soft and benign. Currently states that she's not having much abdominal pain, only mild epigastric discomfort to palpation. Clinically does not seem obstructed as she has  been tolerating oral intake today without any difficulty, passing gas, and had a normal bowel movement with MiraLAX. Evidence of hemorrhoids externally on exam, but no active bleeding. No blood or stool within her rectum, and is is fecal occult negative. X-ray of her abdomen shows stool throughout her colon, and no evidence of free air or obstructive pattern. UA does not show infection but persistent hemoglobin, with 6-30 wbc's. Has been worked up for renal stone in the setting in the past that has been negative. Chronically shows blood within her urine, and she is yet to follow up with urology. He is given follow-up information. She does have evidence of acute kidney injury of 1.9, increased from her baseline of 0.8-0.9. He is given 1 L of IV fluids, and continues to tolerate by mouth intake while here in the ED. At this time, I do not suspect obstruction or other serious intra-abdominal cause of her symptoms. Discussed follow-up with primary care doctor in 1-2 days for a creatinine recheck. She is given warning signs for obstruction and other serious intra-abdominal processes. Strict return instructions reviewed. She expressed understanding of all discharge instructions and felt comfortable to plan of care.  I personally performed the services described in this documentation, which was scribed in my presence. The recorded information has been reviewed and is accurate.   Forde Dandy, MD 04/27/15 [id number]

## 2015-04-26 NOTE — ED Notes (Signed)
Pt c/o abd pain, and eye redness.

## 2015-04-27 ENCOUNTER — Other Ambulatory Visit: Payer: Self-pay | Admitting: Physician Assistant

## 2015-04-27 DIAGNOSIS — D649 Anemia, unspecified: Secondary | ICD-10-CM

## 2015-04-27 DIAGNOSIS — R7989 Other specified abnormal findings of blood chemistry: Secondary | ICD-10-CM

## 2015-04-27 LAB — URINE MICROSCOPIC-ADD ON

## 2015-04-27 LAB — COMPREHENSIVE METABOLIC PANEL
ALK PHOS: 53 U/L (ref 38–126)
ALT: 11 U/L — ABNORMAL LOW (ref 14–54)
ANION GAP: 9 (ref 5–15)
AST: 17 U/L (ref 15–41)
Albumin: 4.1 g/dL (ref 3.5–5.0)
BUN: 38 mg/dL — ABNORMAL HIGH (ref 6–20)
CALCIUM: 9.6 mg/dL (ref 8.9–10.3)
CO2: 22 mmol/L (ref 22–32)
Chloride: 101 mmol/L (ref 101–111)
Creatinine, Ser: 1.92 mg/dL — ABNORMAL HIGH (ref 0.44–1.00)
GFR calc non Af Amer: 28 mL/min — ABNORMAL LOW (ref >60)
GFR, EST AFRICAN AMERICAN: 33 mL/min — AB (ref >60)
Glucose, Bld: 112 mg/dL — ABNORMAL HIGH (ref 65–99)
POTASSIUM: 5.1 mmol/L (ref 3.5–5.1)
SODIUM: 132 mmol/L — AB (ref 135–145)
Total Bilirubin: 0.3 mg/dL (ref 0.3–1.2)
Total Protein: 8 g/dL (ref 6.5–8.1)

## 2015-04-27 LAB — URINALYSIS, ROUTINE W REFLEX MICROSCOPIC
BILIRUBIN URINE: NEGATIVE
Glucose, UA: NEGATIVE mg/dL
Ketones, ur: NEGATIVE mg/dL
Leukocytes, UA: NEGATIVE
NITRITE: NEGATIVE
Protein, ur: NEGATIVE mg/dL
SPECIFIC GRAVITY, URINE: 1.015 (ref 1.005–1.030)
pH: 7 (ref 5.0–8.0)

## 2015-04-27 LAB — LIPASE, BLOOD: Lipase: 36 U/L (ref 11–51)

## 2015-04-27 MED ORDER — SODIUM CHLORIDE 0.9 % IV BOLUS (SEPSIS)
1000.0000 mL | Freq: Once | INTRAVENOUS | Status: AC
  Administered 2015-04-27: 1000 mL via INTRAVENOUS

## 2015-04-27 NOTE — Discharge Instructions (Signed)
There does not seem to be a serious cause of your abdominal pain today. There continues to be blood in the urine, and you will need to follow-up with a urologist about this (see number above to call). Drink plenty of fluids, as your kidneys appear dry today. Please see your primary care doctor this week for a recheck on your kidney function.  Return without fail for worsening symptoms, including fever, worsening pain, vomiting and unable to keep down food/fluids, or any other symptoms concerning to you.  Abdominal Pain, Adult Many things can cause abdominal pain. Usually, abdominal pain is not caused by a disease and will improve without treatment. It can often be observed and treated at home. Your health care provider will do a physical exam and possibly order blood tests and X-rays to help determine the seriousness of your pain. However, in many cases, more time must pass before a clear cause of the pain can be found. Before that point, your health care provider may not know if you need more testing or further treatment. HOME CARE INSTRUCTIONS Monitor your abdominal pain for any changes. The following actions may help to alleviate any discomfort you are experiencing:  Only take over-the-counter or prescription medicines as directed by your health care provider.  Do not take laxatives unless directed to do so by your health care provider.  Try a clear liquid diet (broth, tea, or water) as directed by your health care provider. Slowly move to a bland diet as tolerated. SEEK MEDICAL CARE IF:  You have unexplained abdominal pain.  You have abdominal pain associated with nausea or diarrhea.  You have pain when you urinate or have a bowel movement.  You experience abdominal pain that wakes you in the night.  You have abdominal pain that is worsened or improved by eating food.  You have abdominal pain that is worsened with eating fatty foods.  You have a fever. SEEK IMMEDIATE MEDICAL CARE  IF:  Your pain does not go away within 2 hours.  You keep throwing up (vomiting).  Your pain is felt only in portions of the abdomen, such as the right side or the left lower portion of the abdomen.  You pass bloody or black tarry stools. MAKE SURE YOU:  Understand these instructions.  Will watch your condition.  Will get help right away if you are not doing well or get worse.   This information is not intended to replace advice given to you by your health care provider. Make sure you discuss any questions you have with your health care provider.   Document Released: 12/27/2004 Document Revised: 12/08/2014 Document Reviewed: 11/26/2012 Elsevier Interactive Patient Education [employer].

## 2015-04-27 NOTE — ED Notes (Signed)
Pt states understanding of care given and follow up instructions 

## 2015-04-28 LAB — BASIC METABOLIC PANEL
BUN: 22 mg/dL (ref 7–25)
CALCIUM: 9.6 mg/dL (ref 8.6–10.4)
CO2: 23 mmol/L (ref 20–31)
CREATININE: 1.42 mg/dL — AB (ref 0.50–1.05)
Chloride: 102 mmol/L (ref 98–110)
GLUCOSE: 101 mg/dL — AB (ref 65–99)
Potassium: 5.3 mmol/L (ref 3.5–5.3)
Sodium: 132 mmol/L — ABNORMAL LOW (ref 135–146)

## 2015-04-28 LAB — HEMOGLOBIN: Hemoglobin: 10.7 g/dL — ABNORMAL LOW (ref 12.0–15.0)

## 2015-04-28 LAB — HEMATOCRIT: HEMATOCRIT: 32.9 % — AB (ref 36.0–46.0)

## 2015-05-04 ENCOUNTER — Other Ambulatory Visit: Payer: Self-pay | Admitting: Physician Assistant

## 2015-05-04 DIAGNOSIS — R7989 Other specified abnormal findings of blood chemistry: Secondary | ICD-10-CM

## 2015-05-04 DIAGNOSIS — D649 Anemia, unspecified: Secondary | ICD-10-CM

## 2015-05-04 LAB — BASIC METABOLIC PANEL
BUN: 19 mg/dL (ref 7–25)
CO2: 23 mmol/L (ref 20–31)
CREATININE: 0.92 mg/dL (ref 0.50–1.05)
Calcium: 9.3 mg/dL (ref 8.6–10.4)
Chloride: 105 mmol/L (ref 98–110)
Glucose, Bld: 109 mg/dL — ABNORMAL HIGH (ref 65–99)
Potassium: 4.2 mmol/L (ref 3.5–5.3)
Sodium: 139 mmol/L (ref 135–146)

## 2015-05-04 LAB — HEMOGLOBIN: Hemoglobin: 10.2 g/dL — ABNORMAL LOW (ref 12.0–15.0)

## 2015-05-11 ENCOUNTER — Other Ambulatory Visit: Payer: Self-pay | Admitting: Physician Assistant

## 2015-05-11 ENCOUNTER — Telehealth: Payer: Self-pay | Admitting: Orthopaedic Surgery

## 2015-05-11 DIAGNOSIS — D649 Anemia, unspecified: Secondary | ICD-10-CM

## 2015-05-11 DIAGNOSIS — R1013 Epigastric pain: Secondary | ICD-10-CM

## 2015-05-11 LAB — IFOBT (OCCULT BLOOD): IMMUNOLOGICAL FECAL OCCULT BLOOD TEST: NEGATIVE

## 2015-05-11 NOTE — Telephone Encounter (Signed)
Patient called and requested a refill on Norco 7.5-325 mgs. Qty 120.  This was last written on 04-14-15

## 2015-05-12 MED ORDER — HYDROCODONE-ACETAMINOPHEN 7.5-325 MG PO TABS: 1.0000 | ORAL_TABLET | ORAL | Status: DC | PRN

## 2015-05-12 NOTE — Telephone Encounter (Signed)
Rx printed

## 2015-05-12 NOTE — Telephone Encounter (Signed)
Prescription available, called patient, no answer 

## 2015-05-19 ENCOUNTER — Ambulatory Visit (INDEPENDENT_AMBULATORY_CARE_PROVIDER_SITE_OTHER): Payer: BLUE CROSS/BLUE SHIELD | Admitting: Orthopaedic Surgery

## 2015-05-19 ENCOUNTER — Encounter: Payer: Self-pay | Admitting: Orthopaedic Surgery

## 2015-05-19 VITALS — BP 122/70 | HR 77 | Temp 97.7°F | Resp 18 | Ht 69.0 in | Wt 172.0 lb

## 2015-05-19 DIAGNOSIS — M79641 Pain in right hand: Secondary | ICD-10-CM | POA: Insufficient documentation

## 2015-05-19 DIAGNOSIS — M79642 Pain in left hand: Secondary | ICD-10-CM | POA: Insufficient documentation

## 2015-05-19 MED ORDER — DICLOFENAC SODIUM 1 % TD GEL: 4.0000 g | Freq: Four times a day (QID) | TRANSDERMAL | Status: DC

## 2015-05-19 NOTE — Progress Notes (Signed)
Patient [id number] Amber Drake, female DOB:Apr 23, 1960, 55 y.o. [id number]  Chief Complaint  Patient presents with  . Follow-up    follow up low back pain and bilateral hand pain    HPI  Amber Drake is a 55 y.o. female who has pain in both hands more in the morning.  Cold mornings make her hands worse.  She has some swelling but no numbness, no trauma, no redness. She is taking her Naprosyn which helps.  I will order Voltaren Gel.   HPI  Body mass index is 25.39 kg/(m^2).  Review of Systems  Patient does not have Diabetes Mellitus. Patient does have hypertension. Patient does not have COPD or shortness of breath. Patient does not have BMI > 35. Patient does not have current smoking history.  Review of Systems  Cardiovascular:       Hypertension  Musculoskeletal: Positive for back pain and arthralgias (both hands).  Hematological:       Sickle Cell Trait  All other systems reviewed and are negative.   Past Medical History  Diagnosis Date  . Sickle cell trait ([place])   . Hypertension     does not take meds  . Blood dyscrasia     sickle cell trait  . Hypercholesterolemia     Past Surgical History  Procedure Laterality Date  . Appendectomy    . Incision and drainage perirectal abscess  12/21/09  . Cesarean section      x 2  . Proctoscopy  10/17/2010    Procedure: PROCTOSCOPY;  Surgeon: Scherry Ran;  Location: [facility];  Service: General;  Laterality: N/A;  Rigid Proctoscopy/Possible Fistula in Ano  Procedure ended at 1003  . Therapeutic abortion      x2  . Carpal tunnel release  03/23/2011    Procedure: CARPAL TUNNEL RELEASE;  Surgeon: Sanjuana Kava;  Location: [facility];  Service: Orthopedics;  Laterality: Left;  . Carpal tunnel release  05/10/2011    Procedure: CARPAL TUNNEL RELEASE;  Surgeon: Sanjuana Kava, MD;  Location: [facility];  Service: Orthopedics;  Laterality: Right;    Family History  Problem Relation Age of Onset  . Kidney failure Daughter   . Sickle  cell anemia Daughter   . Sickle cell anemia Son   . Anesthesia problems Neg Hx   . Hypotension Neg Hx   . Malignant hyperthermia Neg Hx   . Pseudochol deficiency Neg Hx     Social History Social History  Substance Use Topics  . Smoking status: Former Smoker -- 0.50 packs/day for 30 years    Types: Cigarettes    Quit date: 04/21/2015  . Smokeless tobacco: None  . Alcohol Use: No    Allergies  Allergen Reactions  . Penicillins Anaphylaxis and Hives    Has patient had a PCN reaction causing immediate rash, facial/tongue/throat swelling, SOB or lightheadedness with hypotension: YES Has patient had a PCN reaction causing severe rash involving mucus membranes or skin necrosis: No Has patient had a PCN reaction that required hospitalization: NO Has patient had a PCN reaction occurring within the last 10 years: NO If all of the above answers are "NO", then may proceed with Cephalosporin use.   Marland Kitchen Penicillins Cross Reactors Itching and Swelling  . Tape Itching    Current Outpatient Prescriptions  Medication Sig Dispense Refill  . atorvastatin (LIPITOR) 20 MG tablet Take 1 tablet (20 mg total) by mouth daily. 90 tablet 2  . fluticasone (FLONASE) 50 MCG/ACT nasal spray Place 2 sprays into both nostrils daily. (  Patient taking differently: Place 2 sprays into both nostrils daily as needed for allergies. ) 16 g 0  . HYDROcodone-acetaminophen (NORCO) 7.5-325 MG tablet Take 1 tablet by mouth every 4 (four) hours as needed for moderate pain (Must last 30 days.  Do not drive or operate machinery while taking this medicine.). 120 tablet 0  . lisinopril-hydrochlorothiazide (PRINZIDE,ZESTORETIC) 20-12.5 MG tablet Take 1 tablet by mouth daily.  1  . Multiple Vitamin (MULTIVITAMIN WITH MINERALS) TABS Take 1 tablet by mouth daily.    . pantoprazole (PROTONIX) 40 MG tablet Take 1 tablet (40 mg total) by mouth daily. 30 tablet 1  . polyethylene glycol powder (GLYCOLAX/MIRALAX) powder Take 17 g by mouth  daily as needed for mild constipation or moderate constipation.    . ranitidine (ZANTAC) 150 MG tablet Take 150 mg by mouth 2 (two) times daily as needed for heartburn.     . sodium chloride (OCEAN) 0.65 % SOLN nasal spray Place 1 spray into both nostrils as needed for congestion. 480 mL 0  . diclofenac sodium (VOLTAREN) 1 % GEL Apply 4 g topically 4 (four) times daily. 5 Tube 5   No current facility-administered medications for this visit.     Physical Exam  Blood pressure 122/70, pulse 77, temperature 97.7 F (36.5 C), resp. rate 18, height 5\' 9"  (1.753 m), weight 172 lb (78.019 kg), last menstrual period 08/12/2010.  Constitutional: overall normal hygiene, normal nutrition, well developed, normal grooming, normal body habitus. Assistive device:none  Musculoskeletal: gait and station Limp none, muscle tone and strength are normal, no tremors or atrophy is present.  .  Neurological: coordination overall normal.  Deep tendon reflex/nerve stretch intact.  Sensation normal.  Cranial nerves II-XII intact.   Skin:   normal overall no scars, lesions, ulcers or rashes. No psoriasis.  Psychiatric: Alert and oriented x 3.  Recent memory intact, remote memory unclear.  Normal mood and affect. Well groomed.  Good eye contact.  Cardiovascular: overall no swelling, no varicosities, no edema bilaterally, normal temperatures of the legs and arms, no clubbing, cyanosis and good capillary refill.  Lymphatic: palpation is normal.   Extremities:both hands are tender but have no swelling. NV is intact. Inspection normal Strength and tone normal Range of motion full of both hands  The patient has been educated about the nature of the problem(s) and counseled on treatment options.  The patient appeared to understand what I have discussed and is in agreement with it.  PLAN Call if any problems.  Precautions discussed.  Continue current medications.   Return to clinic 3 months

## 2015-05-19 NOTE — Patient Instructions (Signed)
Pick up creme from pharmacy

## 2015-06-06 ENCOUNTER — Encounter (HOSPITAL_COMMUNITY): Payer: Self-pay | Admitting: Emergency Medicine

## 2015-06-06 DIAGNOSIS — Z87891 Personal history of nicotine dependence: Secondary | ICD-10-CM | POA: Insufficient documentation

## 2015-06-06 DIAGNOSIS — Z79899 Other long term (current) drug therapy: Secondary | ICD-10-CM | POA: Insufficient documentation

## 2015-06-06 DIAGNOSIS — Z791 Long term (current) use of non-steroidal anti-inflammatories (NSAID): Secondary | ICD-10-CM | POA: Diagnosis not present

## 2015-06-06 DIAGNOSIS — E7800 Pure hypercholesterolemia, unspecified: Secondary | ICD-10-CM | POA: Diagnosis not present

## 2015-06-06 DIAGNOSIS — I10 Essential (primary) hypertension: Secondary | ICD-10-CM | POA: Insufficient documentation

## 2015-06-06 DIAGNOSIS — J111 Influenza due to unidentified influenza virus with other respiratory manifestations: Secondary | ICD-10-CM | POA: Insufficient documentation

## 2015-06-06 DIAGNOSIS — R52 Pain, unspecified: Secondary | ICD-10-CM | POA: Diagnosis present

## 2015-06-06 NOTE — ED Notes (Signed)
Patient complaining of body aches starting this morning. Patient denies other symptoms.

## 2015-06-07 ENCOUNTER — Emergency Department (HOSPITAL_COMMUNITY)
Admission: EM | Admit: 2015-06-07 | Discharge: 2015-06-07 | Disposition: A | Discharge status: Home / Self Care | Payer: BLUE CROSS/BLUE SHIELD | Attending: Emergency Medicine | Admitting: Emergency Medicine

## 2015-06-07 DIAGNOSIS — J111 Influenza due to unidentified influenza virus with other respiratory manifestations: Secondary | ICD-10-CM

## 2015-06-07 MED ORDER — OSELTAMIVIR PHOSPHATE 75 MG PO CAPS: 75.0000 mg | ORAL_CAPSULE | Freq: Two times a day (BID) | ORAL | Status: DC

## 2015-06-07 MED ORDER — OSELTAMIVIR PHOSPHATE 75 MG PO CAPS
75.0000 mg | ORAL_CAPSULE | Freq: Once | ORAL | Status: AC
  Administered 2015-06-07: 75 mg via ORAL
  Filled 2015-06-07: qty 1

## 2015-06-07 NOTE — Discharge Instructions (Signed)

## 2015-06-07 NOTE — ED Provider Notes (Signed)
CSN: [id number]     Arrival date & time 06/06/15  2325 History   By signing my name below, I, Nicole Kindred, attest that this documentation has been prepared under the direction and in the presence of No att. providers found.   Electronically Signed: Nicole Kindred, ED Scribe. 06/07/2015. 12:45 AM   Chief Complaint  Patient presents with  . Generalized Body Aches    The history is provided by the patient. No language interpreter was used.   HPI Comments: Amber Drake is a 55 y.o. female who presents to the Emergency Department complaining of gradual onset, constant generalized body aches, onset one day ago. Pt reports associated chills, and burning sensation in her chest. She states she has had positive sick contact with family members who have been diagnosed with the flu. No worsening or alleviating factors noted. Pt denies fever, congestion, cough, or any other pertinent symptoms.    Past Medical History  Diagnosis Date  . Sickle cell trait ([place])   . Hypertension     does not take meds  . Blood dyscrasia     sickle cell trait  . Hypercholesterolemia    Past Surgical History  Procedure Laterality Date  . Appendectomy    . Incision and drainage perirectal abscess  12/21/09  . Cesarean section      x 2  . Proctoscopy  10/17/2010    Procedure: PROCTOSCOPY;  Surgeon: Scherry Ran;  Location: [facility];  Service: General;  Laterality: N/A;  Rigid Proctoscopy/Possible Fistula in Ano  Procedure ended at 1003  . Therapeutic abortion      x2  . Carpal tunnel release  03/23/2011    Procedure: CARPAL TUNNEL RELEASE;  Surgeon: Sanjuana Kava;  Location: [facility];  Service: Orthopedics;  Laterality: Left;  . Carpal tunnel release  05/10/2011    Procedure: CARPAL TUNNEL RELEASE;  Surgeon: Sanjuana Kava, MD;  Location: [facility];  Service: Orthopedics;  Laterality: Right;   Family History  Problem Relation Age of Onset  . Kidney failure Daughter   . Sickle cell anemia Daughter   .  Sickle cell anemia Son   . Anesthesia problems Neg Hx   . Hypotension Neg Hx   . Malignant hyperthermia Neg Hx   . Pseudochol deficiency Neg Hx    Social History  Substance Use Topics  . Smoking status: Former Smoker -- 0.50 packs/day for 30 years    Types: Cigarettes    Quit date: 04/21/2015  . Smokeless tobacco: None  . Alcohol Use: No   OB History    Gravida Para Term Preterm AB TAB SAB Ectopic Multiple Living   4 2 2  2     2      Review of Systems  Constitutional: Positive for chills. Negative for fever.  HENT: Negative for congestion.   Respiratory: Negative for cough.   Musculoskeletal: Positive for myalgias.  All other systems reviewed and are negative.    Allergies  Penicillins; Penicillins cross reactors; and Tape  Home Medications   Prior to Admission medications   Medication Sig Start Date End Date Taking? Authorizing Provider  atorvastatin (LIPITOR) 20 MG tablet Take 1 tablet (20 mg total) by mouth daily. 03/14/15   Soyla Dryer, PA-C  diclofenac sodium (VOLTAREN) 1 % GEL Apply 4 g topically 4 (four) times daily. 05/19/15   Sanjuana Kava, MD  fluticasone (FLONASE) 50 MCG/ACT nasal spray Place 2 sprays into both nostrils daily. Patient taking differently: Place 2 sprays into both nostrils daily as  needed for allergies.  11/16/14   [name], MD  HYDROcodone-acetaminophen (NORCO) 7.5-325 MG tablet Take 1 tablet by mouth every 4 (four) hours as needed for moderate pain (Must last 30 days.  Do not drive or operate machinery while taking this medicine.). 05/12/15   Sanjuana Kava, MD  lisinopril-hydrochlorothiazide (PRINZIDE,ZESTORETIC) 20-12.5 MG tablet Take 1 tablet by mouth daily. 12/15/14   Historical Provider, MD  Multiple Vitamin (MULTIVITAMIN WITH MINERALS) TABS Take 1 tablet by mouth daily.    Historical Provider, MD  pantoprazole (PROTONIX) 40 MG tablet Take 1 tablet (40 mg total) by mouth daily. 03/01/15   Kristen N Ward, DO  polyethylene glycol powder  (GLYCOLAX/MIRALAX) powder Take 17 g by mouth daily as needed for mild constipation or moderate constipation.    Historical Provider, MD  ranitidine (ZANTAC) 150 MG tablet Take 150 mg by mouth 2 (two) times daily as needed for heartburn.     Historical Provider, MD  sodium chloride (OCEAN) 0.65 % SOLN nasal spray Place 1 spray into both nostrils as needed for congestion. 11/16/14   [name], MD   BP 108/66 mmHg  Pulse 111  Temp(Src) 98.9 F (37.2 C) (Oral)  Resp 18  Ht 5\' 9"  (1.753 m)  Wt 172 lb (78.019 kg)  BMI 25.39 kg/m2  SpO2 100%  LMP 08/12/2010 Physical Exam  Constitutional: She is oriented to person, place, and time. She appears well-developed and well-nourished. No distress.  HENT:  Head: Normocephalic and atraumatic.  Right Ear: Hearing normal.  Left Ear: Hearing normal.  Nose: Nose normal.  Mouth/Throat: Oropharynx is clear and moist and mucous membranes are normal.  Eyes: Conjunctivae and EOM are normal. Pupils are equal, round, and reactive to light.  Neck: Normal range of motion. Neck supple.  Cardiovascular: Regular rhythm, S1 normal and S2 normal.  Exam reveals no gallop and no friction rub.   No murmur heard. Pulmonary/Chest: Effort normal and breath sounds normal. No respiratory distress. She exhibits no tenderness.  Abdominal: Soft. Normal appearance and bowel sounds are normal. There is no hepatosplenomegaly. There is no tenderness. There is no rebound, no guarding, no tenderness at McBurney's point and negative Murphy's sign. No hernia.  Musculoskeletal: Normal range of motion.  Neurological: She is alert and oriented to person, place, and time. She has normal strength. No cranial nerve deficit or sensory deficit. Coordination normal. GCS eye subscore is 4. GCS verbal subscore is 5. GCS motor subscore is 6.  Skin: Skin is warm, dry and intact. No rash noted. No cyanosis.  Psychiatric: She has a normal mood and affect. Her speech is normal and behavior is  normal. Thought content normal.  Nursing note and vitals reviewed.   ED Course  Procedures (including critical care time) DIAGNOSTIC STUDIES: Oxygen Saturation is 100% on RA, normal by my interpretation.    COORDINATION OF CARE: 12:45 AM-Discussed treatment plan which includes Tamiflu and symptom monitoring with pt at bedside and pt agreed to plan.    Labs Review Labs Reviewed - No data to display  Imaging Review No results found.   EKG Interpretation None      MDM   Final diagnoses:  None  Influenza  Presents to the emergency department for evaluation of diffuse body aches. Patient does not have any other specific symptoms. Both of her children have recently been diagnosed with a treated for influenza. Her symptoms are likely secondary to onset of influenza.  Examination is normal. She is not experiencing any shortness of  breath and oxygenation is 100%. Lungs are clear. No abdominal pain. Entire exam is benign. Patient will be treated empirically with Tamiflu, rest, hydration, Motrin and Tylenol.  I personally performed the services described in this documentation, which was scribed in my presence. The recorded information has been reviewed and is accurate.      Orpah Greek, MD 06/07/15 [phone]

## 2015-06-09 ENCOUNTER — Other Ambulatory Visit: Payer: Self-pay | Admitting: Physician Assistant

## 2015-06-14 ENCOUNTER — Ambulatory Visit: Payer: Self-pay | Admitting: Physician Assistant

## 2015-06-15 ENCOUNTER — Encounter: Payer: Self-pay | Admitting: Physician Assistant

## 2015-06-15 ENCOUNTER — Telehealth: Payer: Self-pay | Admitting: Orthopaedic Surgery

## 2015-06-15 ENCOUNTER — Ambulatory Visit: Payer: BLUE CROSS/BLUE SHIELD | Admitting: Physician Assistant

## 2015-06-15 ENCOUNTER — Other Ambulatory Visit: Payer: Self-pay | Admitting: *Deleted

## 2015-06-15 VITALS — BP 120/58 | HR 82 | Temp 98.1°F | Ht 69.0 in | Wt 177.8 lb

## 2015-06-15 DIAGNOSIS — I10 Essential (primary) hypertension: Secondary | ICD-10-CM

## 2015-06-15 DIAGNOSIS — R319 Hematuria, unspecified: Secondary | ICD-10-CM

## 2015-06-15 DIAGNOSIS — K219 Gastro-esophageal reflux disease without esophagitis: Secondary | ICD-10-CM

## 2015-06-15 DIAGNOSIS — E785 Hyperlipidemia, unspecified: Secondary | ICD-10-CM | POA: Insufficient documentation

## 2015-06-15 MED ORDER — HYDROCODONE-ACETAMINOPHEN 7.5-325 MG PO TABS: 1.0000 | ORAL_TABLET | ORAL | Status: DC | PRN

## 2015-06-15 NOTE — Telephone Encounter (Signed)
Patient is requesting refill of Hydrocodone 7.5/325mg   Qty 120 Tablets  1 Tablet every 4 hours as needed for moderate pain.

## 2015-06-15 NOTE — Progress Notes (Signed)
BP 120/58 mmHg  Pulse 82  Temp(Src) 98.1 F (36.7 C)  Ht 5\' 9"  (1.753 m)  Wt 177 lb 12.8 oz (80.65 kg)  BMI 26.24 kg/m2  SpO2 98%  LMP 08/12/2010   Subjective:    Patient ID: Amber Drake, female    DOB: 1960-08-15, 55 y.o.   MRN: [id number]  HPI: Phenix Malo is a 55 y.o. female presenting on 06/15/2015 for Hyperlipidemia; Hypertension; and Hematuria   HPI Pt did not call for MH appt as recommended at last OV. Pt has not smoked x 2 months Pt very stressed about her kids who are sick with sickle cell.  Relevant past medical, surgical, family and social history reviewed and updated as indicated. Interim medical history since our last visit reviewed. Allergies and medications reviewed and updated.   Current outpatient prescriptions:  .  atorvastatin (LIPITOR) 20 MG tablet, Take 1 tablet (20 mg total) by mouth daily., Disp: 90 tablet, Rfl: 2 .  diclofenac sodium (VOLTAREN) 1 % GEL, Apply 4 g topically 4 (four) times daily., Disp: 5 Tube, Rfl: 5 .  fluticasone (FLONASE) 50 MCG/ACT nasal spray, Place 2 sprays into both nostrils daily. (Patient taking differently: Place 2 sprays into both nostrils daily as needed for allergies. ), Disp: 16 g, Rfl: 0 .  HYDROcodone-acetaminophen (NORCO) 7.5-325 MG tablet, Take 1 tablet by mouth every 4 (four) hours as needed for moderate pain (Must last 30 days.  Do not drive or operate machinery while taking this medicine.)., Disp: 120 tablet, Rfl: 0 .  lisinopril-hydrochlorothiazide (PRINZIDE,ZESTORETIC) 20-12.5 MG tablet, TAKE 1 Tablet BY MOUTH ONCE DAILY, Disp: 90 tablet, Rfl: 1 .  Multiple Vitamin (MULTIVITAMIN WITH MINERALS) TABS, Take 1 tablet by mouth daily., Disp: , Rfl:  .  pantoprazole (PROTONIX) 40 MG tablet, Take 1 tablet (40 mg total) by mouth daily., Disp: 30 tablet, Rfl: 1 .  polyethylene glycol powder (GLYCOLAX/MIRALAX) powder, Take 17 g by mouth daily as needed for mild constipation or moderate constipation. Reported on 06/15/2015,  Disp: , Rfl:  .  ranitidine (ZANTAC) 150 MG tablet, Take 150 mg by mouth 2 (two) times daily as needed for heartburn. , Disp: , Rfl:  .  sodium chloride (OCEAN) 0.65 % SOLN nasal spray, Place 1 spray into both nostrils as needed for congestion., Disp: 480 mL, Rfl: 0   Review of Systems  Constitutional: Negative for fever, chills, diaphoresis, appetite change, fatigue and unexpected weight change.  HENT: Positive for dental problem. Negative for congestion, drooling, ear pain, facial swelling, hearing loss, mouth sores, sneezing, sore throat, trouble swallowing and voice change.   Eyes: Negative for pain, discharge, redness, itching and visual disturbance.  Respiratory: Negative for cough, choking, shortness of breath and wheezing.   Cardiovascular: Negative for chest pain, palpitations and leg swelling.  Gastrointestinal: Negative for vomiting, abdominal pain, diarrhea, constipation and blood in stool.  Endocrine: Negative for cold intolerance, heat intolerance and polydipsia.  Genitourinary: Negative for dysuria, hematuria and decreased urine volume.  Musculoskeletal: Negative for back pain, arthralgias and gait problem.  Skin: Negative for rash.  Allergic/Immunologic: Negative for environmental allergies.  Neurological: Negative for seizures, syncope, light-headedness and headaches.  Hematological: Negative for adenopathy.  Psychiatric/Behavioral: Positive for dysphoric mood. Negative for suicidal ideas and agitation. The patient is not nervous/anxious.     Per HPI unless specifically indicated above     Objective:    BP 120/58 mmHg  Pulse 82  Temp(Src) 98.1 F (36.7 C)  Ht 5\' 9"  (1.753 m)  Wt 177 lb 12.8 oz (80.65 kg)  BMI 26.24 kg/m2  SpO2 98%  LMP 08/12/2010  Wt Readings from Last 3 Encounters:  06/15/15 177 lb 12.8 oz (80.65 kg)  06/06/15 172 lb (78.019 kg)  05/19/15 172 lb (78.019 kg)    Physical Exam  Constitutional: She is oriented to person, place, and time. She  appears well-developed and well-nourished.  HENT:  Head: Normocephalic and atraumatic.  Neck: Neck supple.  Cardiovascular: Normal rate and regular rhythm.   Pulmonary/Chest: Effort normal and breath sounds normal.  Abdominal: Soft. Bowel sounds are normal. She exhibits no mass. There is no hepatosplenomegaly. There is no tenderness.  Musculoskeletal: She exhibits no edema.  Lymphadenopathy:    She has no cervical adenopathy.  Neurological: She is alert and oriented to person, place, and time.  Skin: Skin is warm and dry.  Psychiatric: She has a normal mood and affect. Her behavior is normal.  Tearful   Vitals reviewed.   Results for orders placed or performed in visit on 05/11/15  IFOBT POC (occult bld, rslt in office)  Result Value Ref Range   IFOBT Negative       Assessment & Plan:   Encounter Diagnoses  Name Primary?  . Essential hypertension, benign Yes  . Hyperlipidemia   . Gastroesophageal reflux disease, esophagitis presence not specified   . Hematuria      Discussed screening tests including blood labs and mammogram.   Pt states she has no insurance. However, after pt left office, [employer] card found on file and I called and spoke with [place] who confirmed that pt has active [employer].   Ms. Mendel Ryder is thus dismissed from this practice as we do not see people with insurance and no tests will be ordered.  She is encouraged to get new pcp soon to manage her multiple medical conditions (Christy Vaughn left VM on pt's phone for her to call clinic to get this information)

## 2015-07-04 ENCOUNTER — Emergency Department (HOSPITAL_COMMUNITY)
Admission: EM | Admit: 2015-07-04 | Discharge: 2015-07-04 | Disposition: A | Discharge status: Home / Self Care | Payer: BLUE CROSS/BLUE SHIELD | Attending: Emergency Medicine | Admitting: Emergency Medicine

## 2015-07-04 ENCOUNTER — Encounter (HOSPITAL_COMMUNITY): Payer: Self-pay | Admitting: Emergency Medicine

## 2015-07-04 DIAGNOSIS — I10 Essential (primary) hypertension: Secondary | ICD-10-CM | POA: Insufficient documentation

## 2015-07-04 DIAGNOSIS — Z79899 Other long term (current) drug therapy: Secondary | ICD-10-CM | POA: Diagnosis not present

## 2015-07-04 DIAGNOSIS — N764 Abscess of vulva: Secondary | ICD-10-CM | POA: Diagnosis not present

## 2015-07-04 DIAGNOSIS — E7800 Pure hypercholesterolemia, unspecified: Secondary | ICD-10-CM | POA: Insufficient documentation

## 2015-07-04 DIAGNOSIS — Z87891 Personal history of nicotine dependence: Secondary | ICD-10-CM | POA: Insufficient documentation

## 2015-07-04 MED ORDER — PROMETHAZINE HCL 25 MG PO TABS: 25.0000 mg | ORAL_TABLET | Freq: Four times a day (QID) | ORAL | Status: DC | PRN

## 2015-07-04 MED ORDER — SULFAMETHOXAZOLE-TRIMETHOPRIM 800-160 MG PO TABS: 1.0000 | ORAL_TABLET | Freq: Two times a day (BID) | ORAL | Status: AC

## 2015-07-04 NOTE — Discharge Instructions (Signed)
Abscess °An abscess (boil or furuncle) is an infected area on or under the skin. This area is filled with yellowish-white fluid (pus) and other material (debris). °HOME CARE  °· Only take medicines as told by your doctor. °· If you were given antibiotic medicine, take it as directed. Finish the medicine even if you start to feel better. °· If gauze is used, follow your doctor's directions for changing the gauze. °· To avoid spreading the infection: °¨ Keep your abscess covered with a bandage. °¨ Wash your hands well. °¨ Do not share personal care items, towels, or whirlpools with others. °¨ Avoid skin contact with others. °· Keep your skin and clothes clean around the abscess. °· Keep all doctor visits as told. °GET HELP RIGHT AWAY IF:  °· You have more pain, puffiness (swelling), or redness in the wound site. °· You have more fluid or blood coming from the wound site. °· You have muscle aches, chills, or you feel sick. °· You have a fever. °MAKE SURE YOU:  °· Understand these instructions. °· Will watch your condition. °· Will get help right away if you are not doing well or get worse. °  °This information is not intended to replace advice given to you by your health care provider. Make sure you discuss any questions you have with your health care provider. °  °Document Released: 09/05/2007 Document Revised: 09/18/2011 Document Reviewed: 06/02/2011 °Elsevier Interactive Patient Education ©2016 [employer] ° °

## 2015-07-04 NOTE — ED Notes (Signed)
Pt c/o boil to labia x 2 days.

## 2015-07-06 NOTE — ED Provider Notes (Signed)
CSN: [id number]     Arrival date & time 07/04/15  1207 History   First MD Initiated Contact with Patient 07/04/15 1217     Chief Complaint  Patient presents with  . Recurrent Skin Infections     (Consider location/radiation/quality/duration/timing/severity/associated sxs/prior Treatment) HPI   Amber Drake is a 55 y.o. female who presents to the Emergency Department complaining of recurrent abscess to her left labia for 2 days.  She states that she has been soaking in warm water and the abscess began draining several hours prior to arrival.  She denies fever, vomiting, abd pain or vaginal pain.     Past Medical History  Diagnosis Date  . Sickle cell trait ([place])   . Hypertension     does not take meds  . Blood dyscrasia     sickle cell trait  . Hypercholesterolemia    Past Surgical History  Procedure Laterality Date  . Appendectomy    . Incision and drainage perirectal abscess  12/21/09  . Cesarean section      x 2  . Proctoscopy  10/17/2010    Procedure: PROCTOSCOPY;  Surgeon: Scherry Ran;  Location: [facility];  Service: General;  Laterality: N/A;  Rigid Proctoscopy/Possible Fistula in Ano  Procedure ended at 1003  . Therapeutic abortion      x2  . Carpal tunnel release  03/23/2011    Procedure: CARPAL TUNNEL RELEASE;  Surgeon: Sanjuana Kava;  Location: [facility];  Service: Orthopedics;  Laterality: Left;  . Carpal tunnel release  05/10/2011    Procedure: CARPAL TUNNEL RELEASE;  Surgeon: Sanjuana Kava, MD;  Location: [facility];  Service: Orthopedics;  Laterality: Right;   Family History  Problem Relation Age of Onset  . Kidney failure Daughter   . Sickle cell anemia Daughter   . Sickle cell anemia Son   . Anesthesia problems Neg Hx   . Hypotension Neg Hx   . Malignant hyperthermia Neg Hx   . Pseudochol deficiency Neg Hx    Social History  Substance Use Topics  . Smoking status: Former Smoker -- 0.50 packs/day for 30 years    Types: Cigarettes    Quit date:  04/21/2015  . Smokeless tobacco: None  . Alcohol Use: No   OB History    Gravida Para Term Preterm AB TAB SAB Ectopic Multiple Living   4 2 2  2     2      Review of Systems  Constitutional: Negative for fever and chills.  Gastrointestinal: Negative for nausea and vomiting.  Musculoskeletal: Negative for joint swelling and arthralgias.  Skin: Negative for color change.       Abscess   Hematological: Negative for adenopathy.  All other systems reviewed and are negative.     Allergies  Penicillins; Penicillins cross reactors; and Tape  Home Medications   Prior to Admission medications   Medication Sig Start Date End Date Taking? Authorizing Provider  atorvastatin (LIPITOR) 20 MG tablet Take 1 tablet (20 mg total) by mouth daily. 03/14/15   Soyla Dryer, PA-C  diclofenac sodium (VOLTAREN) 1 % GEL Apply 4 g topically 4 (four) times daily. 05/19/15   Sanjuana Kava, MD  fluticasone (FLONASE) 50 MCG/ACT nasal spray Place 2 sprays into both nostrils daily. Patient taking differently: Place 2 sprays into both nostrils daily as needed for allergies.  11/16/14   [name], MD  HYDROcodone-acetaminophen (NORCO) 7.5-325 MG tablet Take 1 tablet by mouth every 4 (four) hours as needed for moderate pain (Must  last 30 days.  Do not drive or operate machinery while taking this medicine.). 06/15/15   Carole Civil, MD  lisinopril-hydrochlorothiazide (PRINZIDE,ZESTORETIC) 20-12.5 MG tablet TAKE 1 Tablet BY MOUTH ONCE DAILY 06/09/15   Soyla Dryer, PA-C  Multiple Vitamin (MULTIVITAMIN WITH MINERALS) TABS Take 1 tablet by mouth daily.    Historical Provider, MD  pantoprazole (PROTONIX) 40 MG tablet Take 1 tablet (40 mg total) by mouth daily. 03/01/15   Kristen N Ward, DO  polyethylene glycol powder (GLYCOLAX/MIRALAX) powder Take 17 g by mouth daily as needed for mild constipation or moderate constipation. Reported on 06/15/2015    Historical Provider, MD  promethazine (PHENERGAN) 25 MG  tablet Take 1 tablet (25 mg total) by mouth every 6 (six) hours as needed for nausea or vomiting. 07/04/15   Daphney Hopke, PA-C  ranitidine (ZANTAC) 150 MG tablet Take 150 mg by mouth 2 (two) times daily as needed for heartburn.     Historical Provider, MD  sodium chloride (OCEAN) 0.65 % SOLN nasal spray Place 1 spray into both nostrils as needed for congestion. 11/16/14   [name], MD  sulfamethoxazole-trimethoprim (BACTRIM DS,SEPTRA DS) 800-160 MG tablet Take 1 tablet by mouth 2 (two) times daily. 07/04/15 07/11/15  Per Beagley, PA-C   BP 124/62 mmHg  Pulse 82  Temp(Src) 98 F (36.7 C)  Resp 16  Ht 5\' 4"  (1.626 m)  Wt 78.472 kg  BMI 29.68 kg/m2  SpO2 100%  LMP 08/12/2010 Physical Exam  Constitutional: She is oriented to person, place, and time. She appears well-developed and well-nourished. No distress.  HENT:  Head: Normocephalic and atraumatic.  Cardiovascular: Normal rate, regular rhythm and normal heart sounds.   No murmur heard. Pulmonary/Chest: Effort normal and breath sounds normal. No respiratory distress.  Abdominal: Soft. She exhibits no distension. There is no tenderness.  Musculoskeletal: Normal range of motion.  Neurological: She is alert and oriented to person, place, and time. She exhibits normal muscle tone. Coordination normal.  Skin: Skin is warm and dry. There is erythema.  1 cm indurated area to the left labia majora with minimal drainage.  No surrounding erythema.    Nursing note and vitals reviewed.   ED Course  Procedures (including critical care time) Labs Review Labs Reviewed - No data to display  Imaging Review No results found. I have personally reviewed and evaluated these images and lab results as part of my medical decision-making.   EKG Interpretation None      MDM   Final diagnoses:  Labial abscess    Pt well appearing.  Hx of frequent boils.  Small abscess to the left labia majora, currently draining.  No indication for I&D  at present.  Agrees to return if sx's worsen.  Rx for bactrim.      Kem Parkinson, PA-C 07/06/15 [place], MD 07/07/15 [phone]

## 2015-07-14 ENCOUNTER — Telehealth: Payer: Self-pay | Admitting: Orthopaedic Surgery

## 2015-07-14 MED ORDER — HYDROCODONE-ACETAMINOPHEN 7.5-325 MG PO TABS: 1.0000 | ORAL_TABLET | ORAL | Status: DC | PRN

## 2015-07-14 NOTE — Telephone Encounter (Signed)
Rx done. 

## 2015-07-14 NOTE — Telephone Encounter (Signed)
Hydrocodone-Acetaminophen 7.5/*3256mg   Qty 120 Tablets

## 2015-08-07 ENCOUNTER — Emergency Department (HOSPITAL_COMMUNITY)
Admission: EM | Admit: 2015-08-07 | Discharge: 2015-08-07 | Disposition: A | Discharge status: Home / Self Care | Payer: BLUE CROSS/BLUE SHIELD | Attending: Emergency Medicine | Admitting: Emergency Medicine

## 2015-08-07 ENCOUNTER — Emergency Department (HOSPITAL_COMMUNITY): Payer: BLUE CROSS/BLUE SHIELD

## 2015-08-07 ENCOUNTER — Encounter (HOSPITAL_COMMUNITY): Payer: Self-pay | Admitting: *Deleted

## 2015-08-07 DIAGNOSIS — I10 Essential (primary) hypertension: Secondary | ICD-10-CM | POA: Insufficient documentation

## 2015-08-07 DIAGNOSIS — Z79899 Other long term (current) drug therapy: Secondary | ICD-10-CM | POA: Diagnosis not present

## 2015-08-07 DIAGNOSIS — W228XXA Striking against or struck by other objects, initial encounter: Secondary | ICD-10-CM | POA: Diagnosis not present

## 2015-08-07 DIAGNOSIS — Y999 Unspecified external cause status: Secondary | ICD-10-CM | POA: Insufficient documentation

## 2015-08-07 DIAGNOSIS — Z87891 Personal history of nicotine dependence: Secondary | ICD-10-CM | POA: Diagnosis not present

## 2015-08-07 DIAGNOSIS — Y929 Unspecified place or not applicable: Secondary | ICD-10-CM | POA: Insufficient documentation

## 2015-08-07 DIAGNOSIS — M79675 Pain in left toe(s): Secondary | ICD-10-CM | POA: Diagnosis not present

## 2015-08-07 DIAGNOSIS — S90112A Contusion of left great toe without damage to nail, initial encounter: Secondary | ICD-10-CM | POA: Diagnosis not present

## 2015-08-07 DIAGNOSIS — Y939 Activity, unspecified: Secondary | ICD-10-CM | POA: Diagnosis not present

## 2015-08-07 DIAGNOSIS — S90122A Contusion of left lesser toe(s) without damage to nail, initial encounter: Secondary | ICD-10-CM

## 2015-08-07 NOTE — ED Notes (Signed)
Pt c/o pain to left 4th toe; pt states she hit it last week and is still having pain

## 2015-08-07 NOTE — ED Provider Notes (Signed)
CSN: [id number]     Arrival date & time 08/07/15  1921 History   First MD Initiated Contact with Patient 08/07/15 2022     Chief Complaint  Patient presents with  . Toe Pain     (Consider location/radiation/quality/duration/timing/severity/associated sxs/prior Treatment) Patient is a 55 y.o. female presenting with toe pain. The history is provided by the patient.  Toe Pain This is a new problem. The current episode started in the past 7 days. The problem occurs intermittently. The problem has been unchanged. Pertinent negatives include no numbness or weakness. The symptoms are aggravated by walking. She has tried nothing for the symptoms. The treatment provided no relief.    Past Medical History  Diagnosis Date  . Sickle cell trait ([place])   . Hypertension     does not take meds  . Blood dyscrasia     sickle cell trait  . Hypercholesterolemia    Past Surgical History  Procedure Laterality Date  . Appendectomy    . Incision and drainage perirectal abscess  12/21/09  . Cesarean section      x 2  . Proctoscopy  10/17/2010    Procedure: PROCTOSCOPY;  Surgeon: Scherry Ran;  Location: [facility];  Service: General;  Laterality: N/A;  Rigid Proctoscopy/Possible Fistula in Ano  Procedure ended at 1003  . Therapeutic abortion      x2  . Carpal tunnel release  03/23/2011    Procedure: CARPAL TUNNEL RELEASE;  Surgeon: Sanjuana Kava;  Location: [facility];  Service: Orthopedics;  Laterality: Left;  . Carpal tunnel release  05/10/2011    Procedure: CARPAL TUNNEL RELEASE;  Surgeon: Sanjuana Kava, MD;  Location: [facility];  Service: Orthopedics;  Laterality: Right;   Family History  Problem Relation Age of Onset  . Kidney failure Daughter   . Sickle cell anemia Daughter   . Sickle cell anemia Son   . Anesthesia problems Neg Hx   . Hypotension Neg Hx   . Malignant hyperthermia Neg Hx   . Pseudochol deficiency Neg Hx    Social History  Substance Use Topics  . Smoking status: Former Smoker --  0.50 packs/day for 30 years    Types: Cigarettes    Quit date: 04/21/2015  . Smokeless tobacco: None  . Alcohol Use: No   OB History    Gravida Para Term Preterm AB TAB SAB Ectopic Multiple Living   4 2 2  2     2      Review of Systems  Musculoskeletal:       Toe pain  Neurological: Negative for weakness and numbness.  All other systems reviewed and are negative.     Allergies  Penicillins; Penicillins cross reactors; and Tape  Home Medications   Prior to Admission medications   Medication Sig Start Date End Date Taking? Authorizing Provider  atorvastatin (LIPITOR) 20 MG tablet Take 1 tablet (20 mg total) by mouth daily. 03/14/15   Soyla Dryer, PA-C  diclofenac sodium (VOLTAREN) 1 % GEL Apply 4 g topically 4 (four) times daily. 05/19/15   Sanjuana Kava, MD  fluticasone (FLONASE) 50 MCG/ACT nasal spray Place 2 sprays into both nostrils daily. Patient taking differently: Place 2 sprays into both nostrils daily as needed for allergies.  11/16/14   [name], MD  HYDROcodone-acetaminophen (NORCO) 7.5-325 MG tablet Take 1 tablet by mouth every 4 (four) hours as needed for moderate pain (Must last 30 days.  Do not drive or operate machinery while taking this medicine.). 07/14/15  Sanjuana Kava, MD  lisinopril-hydrochlorothiazide (PRINZIDE,ZESTORETIC) 20-12.5 MG tablet TAKE 1 Tablet BY MOUTH ONCE DAILY 06/09/15   Soyla Dryer, PA-C  Multiple Vitamin (MULTIVITAMIN WITH MINERALS) TABS Take 1 tablet by mouth daily.    Historical Provider, MD  pantoprazole (PROTONIX) 40 MG tablet Take 1 tablet (40 mg total) by mouth daily. 03/01/15   Kristen N Ward, DO  polyethylene glycol powder (GLYCOLAX/MIRALAX) powder Take 17 g by mouth daily as needed for mild constipation or moderate constipation. Reported on 06/15/2015    Historical Provider, MD  promethazine (PHENERGAN) 25 MG tablet Take 1 tablet (25 mg total) by mouth every 6 (six) hours as needed for nausea or vomiting. 07/04/15   Tammy  Triplett, PA-C  ranitidine (ZANTAC) 150 MG tablet Take 150 mg by mouth 2 (two) times daily as needed for heartburn.     Historical Provider, MD  sodium chloride (OCEAN) 0.65 % SOLN nasal spray Place 1 spray into both nostrils as needed for congestion. 11/16/14   [name], MD   BP 123/61 mmHg  Pulse 73  Temp(Src) 98.2 F (36.8 C) (Oral)  Resp 20  Ht 5\' 9"  (1.753 m)  Wt 82.101 kg  BMI 26.72 kg/m2  SpO2 100%  LMP 08/12/2010 Physical Exam  Constitutional: She is oriented to person, place, and time. She appears well-developed and well-nourished.  Non-toxic appearance.  HENT:  Head: Normocephalic.  Right Ear: Tympanic membrane and external ear normal.  Left Ear: Tympanic membrane and external ear normal.  Eyes: EOM and lids are normal. Pupils are equal, round, and reactive to light.  Neck: Normal range of motion. Neck supple. Carotid bruit is not present.  Cardiovascular: Normal rate, regular rhythm, normal heart sounds, intact distal pulses and normal pulses.   Pulmonary/Chest: Breath sounds normal. No respiratory distress.  Abdominal: Soft. Bowel sounds are normal. There is no tenderness. There is no guarding.  Musculoskeletal: Normal range of motion.       Left foot: There is tenderness. There is normal capillary refill and no deformity.       Feet:  Lymphadenopathy:       Head (right side): No submandibular adenopathy present.       Head (left side): No submandibular adenopathy present.    She has no cervical adenopathy.  Neurological: She is alert and oriented to person, place, and time. She has normal strength. No cranial nerve deficit or sensory deficit.  Skin: Skin is warm and dry.  Psychiatric: She has a normal mood and affect. Her speech is normal.  Nursing note and vitals reviewed.   ED Course  Procedures (including critical care time) Labs Review Labs Reviewed - No data to display  Imaging Review Dg Foot Complete Left  08/07/2015  CLINICAL DATA:  Injured  foot last evening.  Fourth toe pain. EXAM: LEFT FOOT - COMPLETE 3+ VIEW COMPARISON:  05/30/2010. FINDINGS: The joint spaces are maintained. No definite fractures identified. No significant degenerative changes or erosive findings. IMPRESSION: No acute fracture. Electronically Signed   By: Marijo Sanes M.D.   On: 08/07/2015 19:51   I have personally reviewed and evaluated these images and lab results as part of my medical decision-making.   EKG Interpretation None      MDM  X-ray of the left foot is negative for fracture or dislocation. The examination favors contusion/sprain of the left fourth toe. I discussed the findings with the patient in terms which he understands. She will use Tylenol and ibuprofen for soreness. She will see  Dr. Luna Glasgow for additional evaluation if not improving.    Final diagnoses:  Contusion, toe, left, initial encounter    *I have reviewed nursing notes, vital signs, and all appropriate lab and imaging results for this patient.[address] PA-C 08/08/15 0131  Tanna Furry, MD 08/19/15 [phone]

## 2015-08-07 NOTE — Discharge Instructions (Signed)
The x-ray of your left foot is negative for fracture or dislocation.Your exam favors contusion/bruise  to the toes.  Please see the podiatry specialist listed above, if pain and swelling continue. Please elevate your foot is much as possible.

## 2015-08-09 DIAGNOSIS — I10 Essential (primary) hypertension: Secondary | ICD-10-CM | POA: Diagnosis not present

## 2015-08-09 DIAGNOSIS — R739 Hyperglycemia, unspecified: Secondary | ICD-10-CM | POA: Diagnosis not present

## 2015-08-09 DIAGNOSIS — E785 Hyperlipidemia, unspecified: Secondary | ICD-10-CM | POA: Diagnosis not present

## 2015-08-09 DIAGNOSIS — K219 Gastro-esophageal reflux disease without esophagitis: Secondary | ICD-10-CM | POA: Diagnosis not present

## 2015-08-17 ENCOUNTER — Encounter: Payer: Self-pay | Admitting: Orthopaedic Surgery

## 2015-08-17 ENCOUNTER — Ambulatory Visit (INDEPENDENT_AMBULATORY_CARE_PROVIDER_SITE_OTHER): Payer: BLUE CROSS/BLUE SHIELD | Admitting: Orthopaedic Surgery

## 2015-08-17 VITALS — BP 108/68 | HR 64 | Temp 97.5°F | Resp 16 | Ht 69.0 in | Wt 181.0 lb

## 2015-08-17 DIAGNOSIS — M79641 Pain in right hand: Secondary | ICD-10-CM | POA: Diagnosis not present

## 2015-08-17 DIAGNOSIS — I10 Essential (primary) hypertension: Secondary | ICD-10-CM | POA: Diagnosis not present

## 2015-08-17 DIAGNOSIS — M79642 Pain in left hand: Secondary | ICD-10-CM

## 2015-08-17 MED ORDER — HYDROCODONE-ACETAMINOPHEN 7.5-325 MG PO TABS: 1.0000 | ORAL_TABLET | ORAL | Status: DC | PRN

## 2015-08-17 NOTE — Progress Notes (Signed)
Patient [id number] Amber Drake, female DOB:1960/07/22, 55 y.o. [id number]  Chief Complaint  Patient presents with  . Follow-up    hand pain    HPI  Amber Drake is a 55 y.o. female who has bilateral hand pain and tenderness at times.  She has some swelling and the hands hurt more first thing in the morning.  She has had carpal tunnel surgery and she has no numbness.  She uses the Voltaren Gel which helps and takes her pain medicine.  She gets better after an hour or so.  Cold mornings make her hands hurt more.  HPI  Body mass index is 26.72 kg/(m^2).  ROS  Review of Systems  HENT: Negative for congestion.   Respiratory: Negative for cough and shortness of breath.   Cardiovascular: Negative for chest pain and leg swelling.  Endocrine: Positive for cold intolerance.  Musculoskeletal: Positive for arthralgias.  Allergic/Immunologic: Positive for environmental allergies.    Past Medical History  Diagnosis Date  . Sickle cell trait ([place])   . Hypertension     does not take meds  . Blood dyscrasia     sickle cell trait  . Hypercholesterolemia     Past Surgical History  Procedure Laterality Date  . Appendectomy    . Incision and drainage perirectal abscess  12/21/09  . Cesarean section      x 2  . Proctoscopy  10/17/2010    Procedure: PROCTOSCOPY;  Surgeon: Scherry Ran;  Location: [facility];  Service: General;  Laterality: N/A;  Rigid Proctoscopy/Possible Fistula in Ano  Procedure ended at 1003  . Therapeutic abortion      x2  . Carpal tunnel release  03/23/2011    Procedure: CARPAL TUNNEL RELEASE;  Surgeon: Sanjuana Kava;  Location: [facility];  Service: Orthopedics;  Laterality: Left;  . Carpal tunnel release  05/10/2011    Procedure: CARPAL TUNNEL RELEASE;  Surgeon: Sanjuana Kava, MD;  Location: [facility];  Service: Orthopedics;  Laterality: Right;    Family History  Problem Relation Age of Onset  . Kidney failure Daughter   . Sickle cell anemia Daughter   . Sickle cell  anemia Son   . Anesthesia problems Neg Hx   . Hypotension Neg Hx   . Malignant hyperthermia Neg Hx   . Pseudochol deficiency Neg Hx     Social History Social History  Substance Use Topics  . Smoking status: Former Smoker -- 0.50 packs/day for 30 years    Types: Cigarettes    Quit date: 04/21/2015  . Smokeless tobacco: None  . Alcohol Use: No    Allergies  Allergen Reactions  . Penicillins Anaphylaxis and Hives    Has patient had a PCN reaction causing immediate rash, facial/tongue/throat swelling, SOB or lightheadedness with hypotension: YES Has patient had a PCN reaction causing severe rash involving mucus membranes or skin necrosis: No Has patient had a PCN reaction that required hospitalization: NO Has patient had a PCN reaction occurring within the last 10 years: NO If all of the above answers are "NO", then may proceed with Cephalosporin use.   Marland Kitchen Penicillins Cross Reactors Itching and Swelling  . Tape Itching    Current Outpatient Prescriptions  Medication Sig Dispense Refill  . atorvastatin (LIPITOR) 20 MG tablet Take 1 tablet (20 mg total) by mouth daily. 90 tablet 2  . diclofenac sodium (VOLTAREN) 1 % GEL Apply 4 g topically 4 (four) times daily. 5 Tube 5  . fluticasone (FLONASE) 50 MCG/ACT nasal spray Place 2 sprays  into both nostrils daily. (Patient taking differently: Place 2 sprays into both nostrils daily as needed for allergies. ) 16 g 0  . HYDROcodone-acetaminophen (NORCO) 7.5-325 MG tablet Take 1 tablet by mouth every 4 (four) hours as needed for moderate pain (Must last 30 days.  Do not drive or operate machinery while taking this medicine.). 120 tablet 0  . lisinopril-hydrochlorothiazide (PRINZIDE,ZESTORETIC) 20-12.5 MG tablet TAKE 1 Tablet BY MOUTH ONCE DAILY 90 tablet 1  . Multiple Vitamin (MULTIVITAMIN WITH MINERALS) TABS Take 1 tablet by mouth daily.    . pantoprazole (PROTONIX) 40 MG tablet Take 1 tablet (40 mg total) by mouth daily. 30 tablet 1  .  polyethylene glycol powder (GLYCOLAX/MIRALAX) powder Take 17 g by mouth daily as needed for mild constipation or moderate constipation. Reported on 06/15/2015    . promethazine (PHENERGAN) 25 MG tablet Take 1 tablet (25 mg total) by mouth every 6 (six) hours as needed for nausea or vomiting. 12 tablet 0  . ranitidine (ZANTAC) 150 MG tablet Take 150 mg by mouth 2 (two) times daily as needed for heartburn.     . sodium chloride (OCEAN) 0.65 % SOLN nasal spray Place 1 spray into both nostrils as needed for congestion. 480 mL 0   No current facility-administered medications for this visit.     Physical Exam  Blood pressure 108/68, pulse 64, temperature 97.5 F (36.4 C), resp. rate 16, height 5\' 9"  (1.753 m), weight 181 lb (82.101 kg), last menstrual period 08/12/2010.  Constitutional: overall normal hygiene, normal nutrition, well developed, normal grooming, normal body habitus. Assistive device:none  Musculoskeletal: gait and station Limp none, muscle tone and strength are normal, no tremors or atrophy is present.  .  Neurological: coordination overall normal.  Deep tendon reflex/nerve stretch intact.  Sensation normal.  Cranial nerves II-XII intact.   Skin:   normal overall no scars, lesions, ulcers or rashes. No psoriasis.  Psychiatric: Alert and oriented x 3.  Recent memory intact, remote memory unclear.  Normal mood and affect. Well groomed.  Good eye contact.  Cardiovascular: overall no swelling, no varicosities, no edema bilaterally, normal temperatures of the legs and arms, no clubbing, cyanosis and good capillary refill.  Lymphatic: palpation is normal.  Right Hand Exam   Tenderness  The patient is experiencing tenderness in the dorsal area.  Range of Motion  The patient has normal right wrist ROM.   Muscle Strength  The patient has normal right wrist strength.  Tests  Phalen's Sign: negative Tinel's Sign (Medial Nerve): negative  Other  Erythema: absent Sensation:  normal Pulse: present   Left Hand Exam   Tenderness  The patient is experiencing tenderness in the dorsal area.   Range of Motion  The patient has normal left wrist ROM.  Muscle Strength  The patient has normal left wrist strength.  Tests  Phalen's Sign: negative Tinel's Sign (Medial Nerve): negative  Other  Erythema: absent Sensation: normal Pulse: present     The patient has been educated about the nature of the problem(s) and counseled on treatment options.  The patient appeared to understand what I have discussed and is in agreement with it.  Encounter Diagnoses  Name Primary?  . Pain of left hand Yes  . Hand pain, right   . Essential hypertension, benign     PLAN Call if any problems.  Precautions discussed.  Continue current medications.   Return to clinic 3 months

## 2015-09-12 ENCOUNTER — Ambulatory Visit: Payer: BLUE CROSS/BLUE SHIELD | Admitting: Physician Assistant

## 2015-09-14 ENCOUNTER — Telehealth: Payer: Self-pay | Admitting: Orthopaedic Surgery

## 2015-09-14 MED ORDER — HYDROCODONE-ACETAMINOPHEN 7.5-325 MG PO TABS: 1.0000 | ORAL_TABLET | ORAL | Status: DC | PRN

## 2015-09-14 NOTE — Telephone Encounter (Signed)
Rx done. 

## 2015-09-14 NOTE — Telephone Encounter (Signed)
Hydrocodone-Acetaminophen 7.5/325mg Qty 120 Tablets °

## 2015-10-11 ENCOUNTER — Telehealth: Payer: Self-pay | Admitting: Orthopaedic Surgery

## 2015-10-11 MED ORDER — HYDROCODONE-ACETAMINOPHEN 7.5-325 MG PO TABS: 1.0000 | ORAL_TABLET | ORAL | Status: DC | PRN

## 2015-10-11 NOTE — Telephone Encounter (Signed)
Patient called to request refill on medication: HYDROcodone-acetaminophen ([place]) 7.5-325 MG tablet [id number] - quantity 120; patient states pharmacy relayed to her refill is due on 10/13/15.

## 2015-10-11 NOTE — Telephone Encounter (Signed)
Rx done. 

## 2015-10-24 ENCOUNTER — Emergency Department (HOSPITAL_COMMUNITY)
Admission: EM | Admit: 2015-10-24 | Discharge: 2015-10-25 | Disposition: A | Discharge status: Home / Self Care | Payer: BLUE CROSS/BLUE SHIELD | Attending: Emergency Medicine | Admitting: Emergency Medicine

## 2015-10-24 ENCOUNTER — Encounter (HOSPITAL_COMMUNITY): Payer: Self-pay | Admitting: Emergency Medicine

## 2015-10-24 DIAGNOSIS — N390 Urinary tract infection, site not specified: Secondary | ICD-10-CM | POA: Diagnosis not present

## 2015-10-24 DIAGNOSIS — M5441 Lumbago with sciatica, right side: Secondary | ICD-10-CM | POA: Diagnosis not present

## 2015-10-24 DIAGNOSIS — E785 Hyperlipidemia, unspecified: Secondary | ICD-10-CM | POA: Insufficient documentation

## 2015-10-24 DIAGNOSIS — Z87891 Personal history of nicotine dependence: Secondary | ICD-10-CM | POA: Insufficient documentation

## 2015-10-24 DIAGNOSIS — I10 Essential (primary) hypertension: Secondary | ICD-10-CM | POA: Diagnosis not present

## 2015-10-24 DIAGNOSIS — M545 Low back pain: Secondary | ICD-10-CM | POA: Diagnosis not present

## 2015-10-24 NOTE — ED Triage Notes (Signed)
Pt c/o right lower back pain, denies injury or bowel/bladder dysfunction.

## 2015-10-24 NOTE — ED Notes (Signed)
Pt reports right lower back pain. Says has had this in the past. Denies any injury or new activity. No urinary or bowel problems.

## 2015-10-24 NOTE — ED Provider Notes (Signed)
[facility] [facility] DEPT Provider Note   CSN: [id number] Arrival date & time: 10/24/15  2258  First Provider Contact:  First MD Initiated Contact with Patient 10/24/15 2344        History   Chief Complaint Chief Complaint  Patient presents with  . Back Pain    HPI    Back Pain   Pertinent negatives include no chest pain, no fever, no numbness, no abdominal pain, no dysuria and no weakness.  Amber Drake is a 55 y.o. female presenting with gradual onset right lower back pain with radiation into her buttock which started ytd morning which is consistent with prior episodes of sciatica. She denies any new injury of falls. She has nearly constant pain that radiates into her right buttock. She reports increased urinary frequency without dysuria and reports chronic hematuria which "no one can figure out", although has not followed up with urology as recommended by pcp.  She denies fevers, chills, nausea, vomiting,  abdominal pain, weakness or numbness in her extremities and has had no urinary or fecal incontinence.  She currently takes hydrocodone tid for chronic pain associated with carpal tunnel syndrome.  This has not alleviated her back symptoms.     Past Medical History:  Diagnosis Date  . Blood dyscrasia    sickle cell trait  . Hypercholesterolemia   . Hypertension    does not take meds  . Sickle cell trait [facility])     Patient Active Problem List   Diagnosis Date Noted  . Hyperlipidemia 06/15/2015  . Pain of left hand 05/19/2015  . Hand pain, right 05/19/2015  . Hematuria 03/03/2015  . Esophageal reflux 03/03/2015  . Essential hypertension, benign 03/03/2015  . Bronchitis due to tobacco use ([place]) 10/17/2010    Past Surgical History:  Procedure Laterality Date  . APPENDECTOMY    . CARPAL TUNNEL RELEASE  03/23/2011   Procedure: CARPAL TUNNEL RELEASE;  Surgeon: Sanjuana Kava;  Location: [facility];  Service: Orthopedics;  Laterality: Left;  . CARPAL TUNNEL RELEASE  05/10/2011   Procedure: CARPAL TUNNEL RELEASE;  Surgeon: Sanjuana Kava, MD;  Location: [facility];  Service: Orthopedics;  Laterality: Right;  . CESAREAN SECTION     x 2  . INCISION AND DRAINAGE PERIRECTAL ABSCESS  12/21/09  . PROCTOSCOPY  10/17/2010   Procedure: PROCTOSCOPY;  Surgeon: Scherry Ran;  Location: [facility];  Service: General;  Laterality: N/A;  Rigid Proctoscopy/Possible Fistula in Ano  Procedure ended at 1003  . THERAPEUTIC ABORTION     x2    OB History    Gravida Para Term Preterm AB Living   4 2 2   2 2    SAB TAB Ectopic Multiple Live Births                   Home Medications    Prior to Admission medications   Medication Sig Start Date End Date Taking? Authorizing Provider  atorvastatin (LIPITOR) 20 MG tablet Take 1 tablet (20 mg total) by mouth daily. 03/14/15   Soyla Dryer, PA-C  diclofenac sodium (VOLTAREN) 1 % GEL Apply 4 g topically 4 (four) times daily. 05/19/15   Sanjuana Kava, MD  fluticasone (FLONASE) 50 MCG/ACT nasal spray Place 2 sprays into both nostrils daily. Patient taking differently: Place 2 sprays into both nostrils daily as needed for allergies.  11/16/14   [name], MD  HYDROcodone-acetaminophen (NORCO) 7.5-325 MG tablet Take 1 tablet by mouth every 4 (four) hours as needed for moderate pain (Must last  30 days.  Do not drive or operate machinery while taking this medicine.). 10/11/15   Sanjuana Kava, MD  lisinopril-hydrochlorothiazide (PRINZIDE,ZESTORETIC) 20-12.5 MG tablet TAKE 1 Tablet BY MOUTH ONCE DAILY 06/09/15   Soyla Dryer, PA-C  methocarbamol (ROBAXIN) 500 MG tablet Take 2 tablets (1,000 mg total) by mouth 4 (four) times daily. 10/25/15 11/04/15  Evalee Jefferson, PA-C  Multiple Vitamin (MULTIVITAMIN WITH MINERALS) TABS Take 1 tablet by mouth daily.    Historical Provider, MD  pantoprazole (PROTONIX) 40 MG tablet Take 1 tablet (40 mg total) by mouth daily. 03/01/15   Kristen N Ward, DO  polyethylene glycol powder (GLYCOLAX/MIRALAX) powder Take 17 g  by mouth daily as needed for mild constipation or moderate constipation. Reported on 06/15/2015    Historical Provider, MD  predniSONE (DELTASONE) 10 MG tablet 6, 5, 4, 3, 2 then 1 tablet by mouth daily for 6 days total. 10/25/15   Evalee Jefferson, PA-C  promethazine (PHENERGAN) 25 MG tablet Take 1 tablet (25 mg total) by mouth every 6 (six) hours as needed for nausea or vomiting. 07/04/15   Tammy Triplett, PA-C  ranitidine (ZANTAC) 150 MG tablet Take 150 mg by mouth 2 (two) times daily as needed for heartburn.     Historical Provider, MD  sodium chloride (OCEAN) 0.65 % SOLN nasal spray Place 1 spray into both nostrils as needed for congestion. 11/16/14   [name], MD  sulfamethoxazole-trimethoprim (BACTRIM DS,SEPTRA DS) 800-160 MG tablet Take 1 tablet by mouth 2 (two) times daily. 10/25/15 11/01/15  Evalee Jefferson, PA-C    Family History Family History  Problem Relation Age of Onset  . Kidney failure Daughter   . Sickle cell anemia Daughter   . Sickle cell anemia Son   . Anesthesia problems Neg Hx   . Hypotension Neg Hx   . Malignant hyperthermia Neg Hx   . Pseudochol deficiency Neg Hx     Social History Social History  Substance Use Topics  . Smoking status: Former Smoker    Packs/day: 0.50    Years: 30.00    Types: Cigarettes    Quit date: 04/21/2015  . Smokeless tobacco: Never Used  . Alcohol use No     Allergies   Penicillins; Penicillins cross reactors; and Tape   Review of Systems Review of Systems  Constitutional: Negative for fever.  Respiratory: Negative for shortness of breath.   Cardiovascular: Negative for chest pain and leg swelling.  Gastrointestinal: Negative for abdominal distention, abdominal pain and constipation.  Genitourinary: Positive for frequency and hematuria. Negative for difficulty urinating, dysuria, flank pain and urgency.  Musculoskeletal: Positive for back pain. Negative for gait problem and joint swelling.  Skin: Negative for rash.  Neurological:  Negative for weakness and numbness.     Physical Exam Updated Vital Signs BP 127/67 (BP Location: Left Arm)   Pulse 75   Temp 98.3 F (36.8 C) (Oral)   Resp 14   Ht 5\' 9"  (1.753 m)   Wt 83 kg   LMP 07/18/2010   SpO2 100%   BMI 27.02 kg/m   Physical Exam  Constitutional: She appears well-developed and well-nourished.  HENT:  Head: Normocephalic.  Eyes: Conjunctivae are normal.  Neck: Normal range of motion. Neck supple.  Cardiovascular: Normal rate and intact distal pulses.   Pedal pulses normal.  Pulmonary/Chest: Effort normal.  Abdominal: Soft. Bowel sounds are normal. She exhibits no distension and no mass.  Musculoskeletal: Normal range of motion. She exhibits no edema.  Lumbar back: She exhibits tenderness. She exhibits no swelling, no edema and no spasm.  Right paralumbar ttp.  Neurological: She is alert. She has normal strength. She displays no atrophy and no tremor. No sensory deficit. Gait normal.  Reflex Scores:      Patellar reflexes are 2+ on the right side and 2+ on the left side.      Achilles reflexes are 2+ on the right side and 2+ on the left side. No strength deficit noted in hip and knee flexor and extensor muscle groups.  Ankle flexion and extension intact.  Skin: Skin is warm and dry.  Psychiatric: She has a normal mood and affect.  Nursing note and vitals reviewed.    ED Treatments / Results  Labs (all labs ordered are listed, but only abnormal results are displayed) Labs Reviewed  URINALYSIS, ROUTINE W REFLEX MICROSCOPIC (NOT AT [facility]) - Abnormal; Notable for the following:       Result Value   Hgb urine dipstick LARGE (*)    All other components within normal limits  URINE MICROSCOPIC-ADD ON - Abnormal; Notable for the following:    Squamous Epithelial / LPF 6-30 (*)    Bacteria, UA MANY (*)    All other components within normal limits  URINE CULTURE       Procedures Procedures (including critical care time)  Medications  Ordered in ED Medications  methocarbamol (ROBAXIN) tablet 500 mg (500 mg Oral Given 10/25/15 0014)  ketorolac (TORADOL) injection 60 mg (60 mg Intramuscular Given 10/25/15 0015)  oxyCODONE-acetaminophen (PERCOCET/ROXICET) 5-325 MG per tablet 1 tablet (1 tablet Oral Given 10/25/15 0014)     Initial Impression / Assessment and Plan / ED Course  I have reviewed the triage vital signs and the nursing notes.  Pertinent labs & imaging results that were available during my care of the patient were reviewed by me and considered in my medical decision making (see chart for details).  Clinical Course    Pt with right lower back pain with radiation into right buttock suggesting return of her sciatica.  She has chronic hematuria, but with bacteruria as well as increased frequency will tx for uti, bactrim started.  Urine cx sent. Advised she needs recheck by pcp once abx completed.  Added robaxin and prednisone for sciatica sx.  Heat tx.  No neuro deficit on exam or by history to suggest emergent or surgical presentation.  Discussed worsened sx that should prompt immediate re-evaluation including distal weakness, bowel/bladder retention/incontinence.        Final Clinical Impressions(s) / ED Diagnoses   Final diagnoses:  Right-sided low back pain with right-sided sciatica  UTI (lower urinary tract infection)    New Prescriptions New Prescriptions   METHOCARBAMOL (ROBAXIN) 500 MG TABLET    Take 2 tablets (1,000 mg total) by mouth 4 (four) times daily.   PREDNISONE (DELTASONE) 10 MG TABLET    6, 5, 4, 3, 2 then 1 tablet by mouth daily for 6 days total.   SULFAMETHOXAZOLE-TRIMETHOPRIM (BACTRIM DS,SEPTRA DS) 800-160 MG TABLET    Take 1 tablet by mouth 2 (two) times daily.     Evalee Jefferson, PA-C 10/25/15 0130    Rolland Porter, MD 10/25/15 [phone]

## 2015-10-25 LAB — URINALYSIS, ROUTINE W REFLEX MICROSCOPIC
BILIRUBIN URINE: NEGATIVE
GLUCOSE, UA: NEGATIVE mg/dL
KETONES UR: NEGATIVE mg/dL
Leukocytes, UA: NEGATIVE
Nitrite: NEGATIVE
PROTEIN: NEGATIVE mg/dL
Specific Gravity, Urine: 1.015 (ref 1.005–1.030)
pH: 6 (ref 5.0–8.0)

## 2015-10-25 LAB — URINE MICROSCOPIC-ADD ON

## 2015-10-25 MED ORDER — KETOROLAC TROMETHAMINE 60 MG/2ML IM SOLN
60.0000 mg | Freq: Once | INTRAMUSCULAR | Status: AC
  Administered 2015-10-25: 60 mg via INTRAMUSCULAR
  Filled 2015-10-25: qty 2

## 2015-10-25 MED ORDER — SULFAMETHOXAZOLE-TRIMETHOPRIM 800-160 MG PO TABS: 1.0000 | ORAL_TABLET | Freq: Two times a day (BID) | ORAL | 0 refills | Status: AC

## 2015-10-25 MED ORDER — METHOCARBAMOL 500 MG PO TABS: 1000.0000 mg | ORAL_TABLET | Freq: Four times a day (QID) | ORAL | 0 refills | Status: AC

## 2015-10-25 MED ORDER — OXYCODONE-ACETAMINOPHEN 5-325 MG PO TABS
1.0000 | ORAL_TABLET | Freq: Once | ORAL | Status: AC
  Administered 2015-10-25: 1 via ORAL
  Filled 2015-10-25: qty 1

## 2015-10-25 MED ORDER — METHOCARBAMOL 500 MG PO TABS
500.0000 mg | ORAL_TABLET | Freq: Once | ORAL | Status: AC
  Administered 2015-10-25: 500 mg via ORAL
  Filled 2015-10-25: qty 1

## 2015-10-25 MED ORDER — PREDNISONE 10 MG PO TABS: ORAL_TABLET | ORAL | 0 refills | Status: DC

## 2015-10-25 NOTE — ED Notes (Signed)
Pt alert & oriented x4, stable gait. Patient given discharge instructions, paperwork & prescription(s). Patient  instructed to stop at the registration desk to finish any additional paperwork. Patient verbalized understanding. Pt left department w/ no further questions. 

## 2015-10-26 LAB — URINE CULTURE

## 2015-11-10 ENCOUNTER — Encounter: Payer: Self-pay | Admitting: Orthopaedic Surgery

## 2015-11-10 ENCOUNTER — Ambulatory Visit (INDEPENDENT_AMBULATORY_CARE_PROVIDER_SITE_OTHER): Payer: BLUE CROSS/BLUE SHIELD | Admitting: Orthopaedic Surgery

## 2015-11-10 VITALS — BP 109/70 | HR 70 | Ht 67.0 in | Wt 178.0 lb

## 2015-11-10 DIAGNOSIS — M79642 Pain in left hand: Secondary | ICD-10-CM

## 2015-11-10 DIAGNOSIS — M79641 Pain in right hand: Secondary | ICD-10-CM | POA: Diagnosis not present

## 2015-11-10 DIAGNOSIS — I10 Essential (primary) hypertension: Secondary | ICD-10-CM

## 2015-11-10 MED ORDER — HYDROCODONE-ACETAMINOPHEN 7.5-325 MG PO TABS: 1.0000 | ORAL_TABLET | ORAL | 0 refills | Status: DC | PRN

## 2015-11-10 NOTE — Progress Notes (Signed)
Patient [id number] Amber Drake, female DOB:1960/05/17, 55 y.o. [id number]  Chief Complaint  Patient presents with  . Follow-up    BILATERAL HAND PAIN    HPI  Amber Drake is a 55 y.o. female who has bilateral hand pain.  She is using the Voltaren Gel and taking her medicine.  She has no new trauma.  She has no redness or numbness.  Her pain is worse early in the morning. HPI  Body mass index is 27.88 kg/m.  ROS  Review of Systems  HENT: Negative for congestion.   Respiratory: Negative for cough and shortness of breath.   Cardiovascular: Negative for chest pain and leg swelling.  Endocrine: Positive for cold intolerance.  Musculoskeletal: Positive for arthralgias.  Allergic/Immunologic: Positive for environmental allergies.    Past Medical History:  Diagnosis Date  . Blood dyscrasia    sickle cell trait  . Hypercholesterolemia   . Hypertension    does not take meds  . Sickle cell trait [facility])     Past Surgical History:  Procedure Laterality Date  . APPENDECTOMY    . CARPAL TUNNEL RELEASE  03/23/2011   Procedure: CARPAL TUNNEL RELEASE;  Surgeon: Sanjuana Kava;  Location: [facility];  Service: Orthopedics;  Laterality: Left;  . CARPAL TUNNEL RELEASE  05/10/2011   Procedure: CARPAL TUNNEL RELEASE;  Surgeon: Sanjuana Kava, MD;  Location: [facility];  Service: Orthopedics;  Laterality: Right;  . CESAREAN SECTION     x 2  . INCISION AND DRAINAGE PERIRECTAL ABSCESS  12/21/09  . PROCTOSCOPY  10/17/2010   Procedure: PROCTOSCOPY;  Surgeon: Scherry Ran;  Location: [facility];  Service: General;  Laterality: N/A;  Rigid Proctoscopy/Possible Fistula in Ano  Procedure ended at 1003  . THERAPEUTIC ABORTION     x2    Family History  Problem Relation Age of Onset  . Kidney failure Daughter   . Sickle cell anemia Daughter   . Sickle cell anemia Son   . Anesthesia problems Neg Hx   . Hypotension Neg Hx   . Malignant hyperthermia Neg Hx   . Pseudochol deficiency Neg Hx     Social  History Social History  Substance Use Topics  . Smoking status: Former Smoker    Packs/day: 0.50    Years: 30.00    Types: Cigarettes    Quit date: 04/21/2015  . Smokeless tobacco: Never Used  . Alcohol use No      Current Outpatient Prescriptions  Medication Sig Dispense Refill  . atorvastatin (LIPITOR) 20 MG tablet Take 1 tablet (20 mg total) by mouth daily. 90 tablet 2  . diclofenac sodium (VOLTAREN) 1 % GEL Apply 4 g topically 4 (four) times daily. 5 Tube 5  . fluticasone (FLONASE) 50 MCG/ACT nasal spray Place 2 sprays into both nostrils daily. (Patient taking differently: Place 2 sprays into both nostrils daily as needed for allergies. ) 16 g 0  . HYDROcodone-acetaminophen (NORCO) 7.5-325 MG tablet Take 1 tablet by mouth every 4 (four) hours as needed for moderate pain (Must last 30 days.  Do not drive or operate machinery while taking this medicine.). 120 tablet 0  . lisinopril-hydrochlorothiazide (PRINZIDE,ZESTORETIC) 20-12.5 MG tablet TAKE 1 Tablet BY MOUTH ONCE DAILY 90 tablet 1  . Multiple Vitamin (MULTIVITAMIN WITH MINERALS) TABS Take 1 tablet by mouth daily.    . pantoprazole (PROTONIX) 40 MG tablet Take 1 tablet (40 mg total) by mouth daily. 30 tablet 1  . polyethylene glycol powder (GLYCOLAX/MIRALAX) powder Take 17 g by mouth daily as  needed for mild constipation or moderate constipation. Reported on 06/15/2015    . predniSONE (DELTASONE) 10 MG tablet 6, 5, 4, 3, 2 then 1 tablet by mouth daily for 6 days total. 21 tablet 0  . promethazine (PHENERGAN) 25 MG tablet Take 1 tablet (25 mg total) by mouth every 6 (six) hours as needed for nausea or vomiting. 12 tablet 0  . ranitidine (ZANTAC) 150 MG tablet Take 150 mg by mouth 2 (two) times daily as needed for heartburn.     . sodium chloride (OCEAN) 0.65 % SOLN nasal spray Place 1 spray into both nostrils as needed for congestion. 480 mL 0   No current facility-administered medications for this visit.      Physical  Exam  Blood pressure 109/70, pulse 70, height 5\' 7"  (1.702 m), weight 178 lb (80.7 kg), last menstrual period 07/18/2010.  Constitutional: overall normal hygiene, normal nutrition, well developed, normal grooming, normal body habitus. Assistive device:none  Musculoskeletal: gait and station Limp none, muscle tone and strength are normal, no tremors or atrophy is present.  .  Neurological: coordination overall normal.  Deep tendon reflex/nerve stretch intact.  Sensation normal.  Cranial nerves II-XII intact.   Skin:   normal overall no scars, lesions, ulcers or rashes. No psoriasis.  Psychiatric: Alert and oriented x 3.  Recent memory intact, remote memory unclear.  Normal mood and affect. Well groomed.  Good eye contact.  Cardiovascular: overall no swelling, no varicosities, no edema bilaterally, normal temperatures of the legs and arms, no clubbing, cyanosis and good capillary refill.  Lymphatic: palpation is normal.  She has no swelling of either hand, normal ROM, no redness.  NV intact.  The patient has been educated about the nature of the problem(s) and counseled on treatment options.  The patient appeared to understand what I have discussed and is in agreement with it.  Encounter Diagnoses  Name Primary?  . Pain of left hand Yes  . Hand pain, right   . Essential hypertension, benign     PLAN Call if any problems.  Precautions discussed.  Continue current medications.   Return to clinic 3 months   Electronically Signed Sanjuana Kava, MD 8/10/201710:03 AM

## 2015-11-16 ENCOUNTER — Other Ambulatory Visit (HOSPITAL_COMMUNITY): Payer: Self-pay | Admitting: Internal Medicine

## 2015-11-16 DIAGNOSIS — I10 Essential (primary) hypertension: Secondary | ICD-10-CM | POA: Diagnosis not present

## 2015-11-16 DIAGNOSIS — R739 Hyperglycemia, unspecified: Secondary | ICD-10-CM | POA: Diagnosis not present

## 2015-11-16 DIAGNOSIS — Z1231 Encounter for screening mammogram for malignant neoplasm of breast: Secondary | ICD-10-CM

## 2015-11-16 DIAGNOSIS — R300 Dysuria: Secondary | ICD-10-CM | POA: Diagnosis not present

## 2015-11-16 DIAGNOSIS — Z0001 Encounter for general adult medical examination with abnormal findings: Secondary | ICD-10-CM | POA: Diagnosis not present

## 2015-11-16 DIAGNOSIS — E785 Hyperlipidemia, unspecified: Secondary | ICD-10-CM | POA: Diagnosis not present

## 2015-11-16 DIAGNOSIS — F17200 Nicotine dependence, unspecified, uncomplicated: Secondary | ICD-10-CM | POA: Diagnosis not present

## 2015-11-17 ENCOUNTER — Ambulatory Visit: Payer: BLUE CROSS/BLUE SHIELD | Admitting: Orthopaedic Surgery

## 2015-11-21 ENCOUNTER — Ambulatory Visit (HOSPITAL_COMMUNITY)
Admission: RE | Admit: 2015-11-21 | Discharge: 2015-11-21 | Disposition: A | Discharge status: Home / Self Care | Payer: BLUE CROSS/BLUE SHIELD | Source: Ambulatory Visit | Attending: Internal Medicine | Admitting: Internal Medicine

## 2015-11-21 ENCOUNTER — Encounter: Payer: Self-pay | Admitting: *Deleted

## 2015-11-21 DIAGNOSIS — Z1231 Encounter for screening mammogram for malignant neoplasm of breast: Secondary | ICD-10-CM | POA: Insufficient documentation

## 2015-11-24 ENCOUNTER — Other Ambulatory Visit: Payer: BLUE CROSS/BLUE SHIELD | Admitting: Obstetrics and Gynecology

## 2015-11-28 DIAGNOSIS — R300 Dysuria: Secondary | ICD-10-CM | POA: Diagnosis not present

## 2015-12-01 ENCOUNTER — Other Ambulatory Visit (HOSPITAL_COMMUNITY)
Admission: RE | Admit: 2015-12-01 | Discharge: 2015-12-01 | Disposition: A | Discharge status: Home / Self Care | Payer: BLUE CROSS/BLUE SHIELD | Source: Ambulatory Visit | Attending: Obstetrics and Gynecology | Admitting: Obstetrics and Gynecology

## 2015-12-01 ENCOUNTER — Ambulatory Visit (INDEPENDENT_AMBULATORY_CARE_PROVIDER_SITE_OTHER): Payer: BLUE CROSS/BLUE SHIELD | Admitting: Obstetrics and Gynecology

## 2015-12-01 ENCOUNTER — Encounter: Payer: Self-pay | Admitting: Obstetrics and Gynecology

## 2015-12-01 VITALS — BP 110/64 | HR 89 | Ht 66.0 in | Wt 179.0 lb

## 2015-12-01 DIAGNOSIS — Z01419 Encounter for gynecological examination (general) (routine) without abnormal findings: Secondary | ICD-10-CM

## 2015-12-01 DIAGNOSIS — Z1151 Encounter for screening for human papillomavirus (HPV): Secondary | ICD-10-CM | POA: Diagnosis not present

## 2015-12-01 NOTE — Progress Notes (Signed)
Assessment:  Annual Gyn Exam   Plan:  1. pap smear done, next pap due 3 years 2. return annually or prn 3    Annual mammogram advised Subjective:  Amber Drake is a 55 y.o. female [phone] who presents for annual exam. Patient's last menstrual period was 07/18/2010. The patient has no acute complaints at this time.   The following portions of the patient's history were reviewed and updated as appropriate: allergies, current medications, past family history, past medical history, past social history, past surgical history and problem list.  Past Medical History:  Diagnosis Date  . Anemia   . Arthritis   . Blood dyscrasia    sickle cell trait  . Hypercholesterolemia   . Hypertension    does not take meds  . Sickle cell trait [facility])     Past Surgical History:  Procedure Laterality Date  . APPENDECTOMY    . CARPAL TUNNEL RELEASE  03/23/2011   Procedure: CARPAL TUNNEL RELEASE;  Surgeon: Sanjuana Kava;  Location: [facility];  Service: Orthopedics;  Laterality: Left;  . CARPAL TUNNEL RELEASE  05/10/2011   Procedure: CARPAL TUNNEL RELEASE;  Surgeon: Sanjuana Kava, MD;  Location: [facility];  Service: Orthopedics;  Laterality: Right;  . CESAREAN SECTION     x 2  . INCISION AND DRAINAGE PERIRECTAL ABSCESS  12/21/09  . PROCTOSCOPY  10/17/2010   Procedure: PROCTOSCOPY;  Surgeon: Scherry Ran;  Location: [facility];  Service: General;  Laterality: N/A;  Rigid Proctoscopy/Possible Fistula in Ano  Procedure ended at 1003  . THERAPEUTIC ABORTION     x2     Current Outpatient Prescriptions:  .  atorvastatin (LIPITOR) 20 MG tablet, Take 1 tablet (20 mg total) by mouth daily., Disp: 90 tablet, Rfl: 2 .  HYDROcodone-acetaminophen (NORCO) 7.5-325 MG tablet, Take 1 tablet by mouth every 4 (four) hours as needed for moderate pain (Must last 30 days.  Do not drive or operate machinery while taking this medicine.)., Disp: 120 tablet, Rfl: 0 .  lisinopril-hydrochlorothiazide (PRINZIDE,ZESTORETIC)  20-12.5 MG tablet, Take 1 tablet by mouth daily., Disp: , Rfl:  .  methocarbamol (ROBAXIN) 500 MG tablet, Take 500 mg by mouth 4 (four) times daily., Disp: , Rfl:  .  ranitidine (ZANTAC) 150 MG tablet, Take 150 mg by mouth 2 (two) times daily as needed for heartburn. , Disp: , Rfl:   Review of Systems Constitutional: negative Gastrointestinal: negative Genitourinary: negative  Objective:  BP 110/64   Pulse 89   Ht 5\' 6"  (1.676 m)   Wt 179 lb (81.2 kg)   LMP 07/18/2010   BMI 28.89 kg/m    BMI: Body mass index is 28.89 kg/m.  General Appearance: Alert, appropriate appearance for age. No acute distress HEENT: Grossly normal Neck / Thyroid:  Cardiovascular: RRR; normal S1, S2, no murmur Lungs: CTA bilaterally Back: No CVAT Breast Exam: No dimpling, nipple retraction or discharge. No masses or nodes., Normal to inspection, Normal breast tissue bilaterally and No masses or nodes.No dimpling, nipple retraction or discharge. Gastrointestinal: Soft, non-tender, no masses or organomegaly. Midline abdominal scar  Pelvic Exam: Vulva and vagina appear normal. Bimanual exam reveals normal uterus and adnexa. External genitalia: normal general appearance Vaginal: normal without tenderness, induration or masses Cervix: normal appearance Adnexa: normal bimanual exam Uterus: normal single, nontender  PAP: Pap smear done today.  Rectovaginal: Multiple areas of perianal scarring from hidradenitis  RECTAL: normal rectal, no masses, guaiac negative stool obtained. Lymphatic Exam: Non-palpable nodes in neck, clavicular, axillary, or inguinal  regions  Skin: no rash or abnormalities Neurologic: Normal gait and speech, no tremor  Psychiatric: Alert and oriented, appropriate affect.  Urinalysis:Not done  Mallory Shirk. MD Pgr [phone] 12:04 PM    By signing my name below, I, Evelene Croon, attest that this documentation has been prepared under the direction and in the presence of Jonnie Kind, MD . Electronically Signed: Evelene Croon, Scribe. 12/01/2015. 12:17 PM. I personally performed the services described in this documentation, which was SCRIBED in my presence. The recorded information has been reviewed and considered accurate. It has been edited as necessary during review. Jonnie Kind, MD

## 2015-12-02 LAB — CYTOLOGY - PAP

## 2015-12-07 ENCOUNTER — Telehealth: Payer: Self-pay | Admitting: Orthopaedic Surgery

## 2015-12-07 MED ORDER — HYDROCODONE-ACETAMINOPHEN 7.5-325 MG PO TABS: 1.0000 | ORAL_TABLET | Freq: Four times a day (QID) | ORAL | 0 refills | Status: DC | PRN

## 2015-12-07 NOTE — Telephone Encounter (Signed)
Hydrocodone-Acetaminophen 7.5/325mg Qty 120 Tablets °

## 2015-12-28 ENCOUNTER — Encounter (HOSPITAL_COMMUNITY): Payer: Self-pay | Admitting: Emergency Medicine

## 2015-12-28 ENCOUNTER — Emergency Department (HOSPITAL_COMMUNITY)
Admission: EM | Admit: 2015-12-28 | Discharge: 2015-12-28 | Disposition: A | Discharge status: Home / Self Care | Payer: BLUE CROSS/BLUE SHIELD | Attending: Emergency Medicine | Admitting: Emergency Medicine

## 2015-12-28 DIAGNOSIS — Z87891 Personal history of nicotine dependence: Secondary | ICD-10-CM | POA: Insufficient documentation

## 2015-12-28 DIAGNOSIS — L02215 Cutaneous abscess of perineum: Secondary | ICD-10-CM | POA: Diagnosis not present

## 2015-12-28 DIAGNOSIS — I10 Essential (primary) hypertension: Secondary | ICD-10-CM | POA: Diagnosis not present

## 2015-12-28 DIAGNOSIS — R110 Nausea: Secondary | ICD-10-CM | POA: Diagnosis not present

## 2015-12-28 MED ORDER — DOXYCYCLINE HYCLATE 100 MG PO CAPS: 100.0000 mg | ORAL_CAPSULE | Freq: Two times a day (BID) | ORAL | 0 refills | Status: DC

## 2015-12-28 MED ORDER — METHOCARBAMOL 500 MG PO TABS: 500.0000 mg | ORAL_TABLET | Freq: Four times a day (QID) | ORAL | 1 refills | Status: DC

## 2015-12-28 NOTE — ED Triage Notes (Signed)
Patient complaining of "several" abscesses on buttocks since yesterday.

## 2015-12-28 NOTE — Discharge Instructions (Signed)
Continue warm soaks. Take the antibiotics as prescribed. Follow-up with your primary doctor later this week or early next week to be rechecked.

## 2015-12-28 NOTE — ED Provider Notes (Signed)
[facility] DEPT Provider Note   CSN: [id number] Arrival date & time: 12/28/15  0915  By signing my name below, I, [name], attest that this documentation has been prepared under the direction and in the presence of Dorie Rank, MD. Electronically Signed: [name], ED Scribe. 12/28/15. 9:39 AM.   History   Chief Complaint Chief Complaint  Patient presents with  . Abscess    HPI HPI Comments: Amber Drake is a 55 y.o. female who presents to the Emergency Department complaining of multiple worsening points of pain and swelling to her gluteal region which began yesterday. Pt states she went to see her PCP this morning for her symptoms (Dr. Legrand Rams) and due to his schedule came to the ED for evaluation. She reports associated nausea and moderate leg pain which she states is consistent with her chronic sciatica and denies it has acutely worsened. She confirms her listed allergies. Pt denies fevers, chills, or any drainage from the affected region.    The history is provided by the patient. No language interpreter was used.    Past Medical History:  Diagnosis Date  . Anemia   . Arthritis   . Blood dyscrasia    sickle cell trait  . Hypercholesterolemia   . Hypertension    does not take meds  . Sickle cell trait [facility])     Patient Active Problem List   Diagnosis Date Noted  . Hyperlipidemia 06/15/2015  . Pain of left hand 05/19/2015  . Hand pain, right 05/19/2015  . Hematuria 03/03/2015  . Esophageal reflux 03/03/2015  . Essential hypertension, benign 03/03/2015  . Bronchitis due to tobacco use ([place]) 10/17/2010    Past Surgical History:  Procedure Laterality Date  . APPENDECTOMY    . CARPAL TUNNEL RELEASE  03/23/2011   Procedure: CARPAL TUNNEL RELEASE;  Surgeon: Sanjuana Kava;  Location: [facility];  Service: Orthopedics;  Laterality: Left;  . CARPAL TUNNEL RELEASE  05/10/2011   Procedure: CARPAL TUNNEL RELEASE;  Surgeon: Sanjuana Kava, MD;  Location: [facility];   Service: Orthopedics;  Laterality: Right;  . CESAREAN SECTION     x 2  . INCISION AND DRAINAGE PERIRECTAL ABSCESS  12/21/09  . PROCTOSCOPY  10/17/2010   Procedure: PROCTOSCOPY;  Surgeon: Scherry Ran;  Location: [facility];  Service: General;  Laterality: N/A;  Rigid Proctoscopy/Possible Fistula in Ano  Procedure ended at 1003  . THERAPEUTIC ABORTION     x2    OB History    Gravida Para Term Preterm AB Living   4 2 2   2 2    SAB TAB Ectopic Multiple Live Births                   Home Medications    Prior to Admission medications   Medication Sig Start Date End Date Taking? Authorizing Provider  atorvastatin (LIPITOR) 20 MG tablet Take 1 tablet (20 mg total) by mouth daily. 03/14/15   Soyla Dryer, PA-C  doxycycline (VIBRAMYCIN) 100 MG capsule Take 1 capsule (100 mg total) by mouth 2 (two) times daily. 12/28/15   Dorie Rank, MD  HYDROcodone-acetaminophen (NORCO) 7.5-325 MG tablet Take 1 tablet by mouth every 6 (six) hours as needed for moderate pain (Must last 30 days.Do not drive or operate machinery while taking this medicine.). 12/07/15   Sanjuana Kava, MD  lisinopril-hydrochlorothiazide (PRINZIDE,ZESTORETIC) 20-12.5 MG tablet Take 1 tablet by mouth daily.    Historical Provider, MD  methocarbamol (ROBAXIN) 500 MG tablet Take 1 tablet (500 mg total)  by mouth 4 (four) times daily. 12/28/15   Dorie Rank, MD  ranitidine (ZANTAC) 150 MG tablet Take 150 mg by mouth 2 (two) times daily as needed for heartburn.     Historical Provider, MD    Family History Family History  Problem Relation Age of Onset  . Kidney failure Daughter   . Sickle cell anemia Daughter   . Sickle cell anemia Son   . Hypertension Mother   . Hypertension Other   . Anesthesia problems Neg Hx   . Hypotension Neg Hx   . Malignant hyperthermia Neg Hx   . Pseudochol deficiency Neg Hx     Social History Social History  Substance Use Topics  . Smoking status: Former Smoker    Packs/day: 0.50    Years:  30.00    Types: Cigarettes    Quit date: 04/21/2015  . Smokeless tobacco: Never Used  . Alcohol use No     Allergies   Penicillins; Penicillins cross reactors; and Tape   Review of Systems Review of Systems  Constitutional: Negative for chills and fever.  Gastrointestinal: Positive for nausea. Negative for vomiting.  Musculoskeletal: Positive for myalgias.  Skin: Positive for color change and rash.  All other systems reviewed and are negative.  Physical Exam Updated Vital Signs BP 104/68   Pulse 92   Temp 98.1 F (36.7 C) (Oral)   Resp 20   Ht 5\' 8"  (1.727 m)   Wt 82.6 kg   LMP 07/18/2010   SpO2 99%   BMI 27.67 kg/m   Physical Exam  Constitutional: She appears well-developed and well-nourished. No distress.  HENT:  Head: Normocephalic and atraumatic.  Right Ear: External ear normal.  Left Ear: External ear normal.  Eyes: Conjunctivae are normal. Right eye exhibits no discharge. Left eye exhibits no discharge. No scleral icterus.  Neck: Neck supple. No tracheal deviation present.  Cardiovascular: Normal rate, regular rhythm and intact distal pulses.   Pulmonary/Chest: Effort normal and breath sounds normal. No stridor. No respiratory distress. She has no wheezes. She has no rales.  Abdominal: Soft. Bowel sounds are normal. She exhibits no distension. There is no tenderness. There is no rebound and no guarding.  Genitourinary:  Genitourinary Comments: Areas of scarring in the left perienum inferior to the labia, 1 cm size area of induration, no erythema, when palpating the wound, purulent fluid expressed  Musculoskeletal: She exhibits no edema or tenderness.  Neurological: She is alert. She has normal strength. No cranial nerve deficit (no facial droop, extraocular movements intact, no slurred speech) or sensory deficit. She exhibits normal muscle tone. She displays no seizure activity. Coordination normal.  Skin: Skin is warm and dry. No rash noted.  Psychiatric: She  has a normal mood and affect.  Nursing note and vitals reviewed.  ED Treatments / Results    Procedures  DIAGNOSTIC STUDIES: Oxygen Saturation is 99% on RA, normal by my interpretation.    COORDINATION OF CARE: 9:38 AM Discussed next steps with pt. Pt verbalized understanding and is agreeable with the plan.       Initial Impression / Assessment and Plan / ED Course  I have reviewed the triage vital signs and the nursing notes.  Pertinent labs & imaging results that were available during my care of the patient were reviewed by me and considered in my medical decision making (see chart for details).  Clinical Course  The patient appears to have a spontaneously draining abscess left side of her perineum. She has Had  prior abscesses and surgery before in that area.  Patient does not have any significant fluctuance or erythema. Multiple similar episodes in the past and she states typically the antibiotics help resolve the infection. I do not  Feel that furthr I&D is necessary at this point. I'll have the patient continue warm soaks and started on a course of doxycycline. Follow up with her doctor later this week if the symptoms persist  She also requested a refill of her muscle relaxant  I personally performed the services described in this documentation, which was scribed in my presence.  The recorded information has been reviewed and is accurate.   Final Clinical Impressions(s) / ED Diagnoses   Final diagnoses:  Perineal abscess    New Prescriptions New Prescriptions   DOXYCYCLINE (VIBRAMYCIN) 100 MG CAPSULE    Take 1 capsule (100 mg total) by mouth 2 (two) times daily.     Dorie Rank, MD 12/28/15 [id number]

## 2016-01-05 ENCOUNTER — Other Ambulatory Visit: Payer: Self-pay | Admitting: *Deleted

## 2016-01-05 ENCOUNTER — Telehealth: Payer: Self-pay | Admitting: Orthopaedic Surgery

## 2016-01-05 MED ORDER — HYDROCODONE-ACETAMINOPHEN 7.5-325 MG PO TABS: 1.0000 | ORAL_TABLET | Freq: Four times a day (QID) | ORAL | 0 refills | Status: DC | PRN

## 2016-01-05 NOTE — Telephone Encounter (Signed)
Hydrocodone-Acetaminophen 7.5/325mg   Qty 100 Tablets  Take 1 tablet by mouth every 6 (six) hours as needed for moderate   pain  (Must last 30 days. Do not drive or operate machinery while   taking this medicine.).

## 2016-01-23 ENCOUNTER — Encounter (HOSPITAL_COMMUNITY): Payer: Self-pay | Admitting: Emergency Medicine

## 2016-01-23 ENCOUNTER — Emergency Department (HOSPITAL_COMMUNITY)
Admission: EM | Admit: 2016-01-23 | Discharge: 2016-01-23 | Disposition: A | Discharge status: Home / Self Care | Payer: BLUE CROSS/BLUE SHIELD | Attending: Dermatology | Admitting: Dermatology

## 2016-01-23 DIAGNOSIS — I10 Essential (primary) hypertension: Secondary | ICD-10-CM | POA: Diagnosis not present

## 2016-01-23 DIAGNOSIS — Z79899 Other long term (current) drug therapy: Secondary | ICD-10-CM | POA: Diagnosis not present

## 2016-01-23 DIAGNOSIS — Y929 Unspecified place or not applicable: Secondary | ICD-10-CM | POA: Insufficient documentation

## 2016-01-23 DIAGNOSIS — Z5321 Procedure and treatment not carried out due to patient leaving prior to being seen by health care provider: Secondary | ICD-10-CM | POA: Diagnosis not present

## 2016-01-23 DIAGNOSIS — Y939 Activity, unspecified: Secondary | ICD-10-CM | POA: Insufficient documentation

## 2016-01-23 DIAGNOSIS — S1086XA Insect bite of other specified part of neck, initial encounter: Secondary | ICD-10-CM | POA: Insufficient documentation

## 2016-01-23 DIAGNOSIS — Z87891 Personal history of nicotine dependence: Secondary | ICD-10-CM | POA: Insufficient documentation

## 2016-01-23 DIAGNOSIS — W57XXXA Bitten or stung by nonvenomous insect and other nonvenomous arthropods, initial encounter: Secondary | ICD-10-CM | POA: Diagnosis not present

## 2016-01-23 DIAGNOSIS — Y999 Unspecified external cause status: Secondary | ICD-10-CM | POA: Diagnosis not present

## 2016-01-23 NOTE — ED Notes (Signed)
Pt states she doesn't want to be seen at this time. States she feels better. Pt ambulatory upon leaving.

## 2016-01-23 NOTE — ED Triage Notes (Signed)
Pt reports being stung by an unknown insect this afternoon on posterior neck.  Pt airway intact.

## 2016-02-01 ENCOUNTER — Telehealth: Payer: Self-pay | Admitting: Orthopaedic Surgery

## 2016-02-01 MED ORDER — HYDROCODONE-ACETAMINOPHEN 7.5-325 MG PO TABS: 1.0000 | ORAL_TABLET | Freq: Four times a day (QID) | ORAL | 0 refills | Status: DC | PRN

## 2016-02-01 NOTE — Telephone Encounter (Signed)
Patient requests a refill on Hydrocodone/Acetaminophen 7.5-325 mgs.   Qty  100   Sig: Take 1 tablet by mouth every 6 (six) hours as needed for moderate pain (Must last 30 days.Do not drive or operate machinery while taking this medicine.).

## 2016-02-09 ENCOUNTER — Ambulatory Visit: Payer: BLUE CROSS/BLUE SHIELD | Admitting: Orthopaedic Surgery

## 2016-02-28 ENCOUNTER — Ambulatory Visit (INDEPENDENT_AMBULATORY_CARE_PROVIDER_SITE_OTHER): Payer: BLUE CROSS/BLUE SHIELD | Admitting: Orthopaedic Surgery

## 2016-02-28 VITALS — BP 119/64 | HR 63 | Temp 97.9°F | Ht 68.0 in | Wt 179.0 lb

## 2016-02-28 DIAGNOSIS — M79642 Pain in left hand: Secondary | ICD-10-CM

## 2016-02-28 DIAGNOSIS — I10 Essential (primary) hypertension: Secondary | ICD-10-CM

## 2016-02-28 DIAGNOSIS — M79641 Pain in right hand: Secondary | ICD-10-CM | POA: Diagnosis not present

## 2016-02-28 MED ORDER — HYDROCODONE-ACETAMINOPHEN 7.5-325 MG PO TABS: 1.0000 | ORAL_TABLET | Freq: Four times a day (QID) | ORAL | 0 refills | Status: DC | PRN

## 2016-02-28 NOTE — Progress Notes (Signed)
Patient Amber [id number] Amber Drake, female DOB:1960-10-28, 55 y.o. [id number]  Chief Complaint  Patient presents with  . Follow-up    Chronic bilateral hand pain    HPI  [name] is a 55 y.o. female who has bilateral hand pain. It is stable. She is post bilateral carpal tunnel release in the past. She has used the Voltaren Gel which helps. She has no new trauma. She has no redness. HPI  Body mass index is 27.22 kg/m.  ROS  Review of Systems  HENT: Negative for congestion.   Respiratory: Negative for cough and shortness of breath.   Cardiovascular: Negative for chest pain and leg swelling.  Endocrine: Positive for cold intolerance.  Musculoskeletal: Positive for arthralgias.  Allergic/Immunologic: Positive for environmental allergies.    Past Medical History:  Diagnosis Date  . Anemia   . Arthritis   . Blood dyscrasia    sickle cell trait  . Hypercholesterolemia   . Hypertension    does not take meds  . Sickle cell trait [facility])     Past Surgical History:  Procedure Laterality Date  . APPENDECTOMY    . CARPAL TUNNEL RELEASE  03/23/2011   Procedure: CARPAL TUNNEL RELEASE;  Surgeon: Sanjuana Kava;  Location: [facility];  Service: Orthopedics;  Laterality: Left;  . CARPAL TUNNEL RELEASE  05/10/2011   Procedure: CARPAL TUNNEL RELEASE;  Surgeon: Sanjuana Kava, MD;  Location: [facility];  Service: Orthopedics;  Laterality: Right;  . CESAREAN SECTION     x 2  . INCISION AND DRAINAGE PERIRECTAL ABSCESS  12/21/09  . PROCTOSCOPY  10/17/2010   Procedure: PROCTOSCOPY;  Surgeon: Scherry Ran;  Location: [facility];  Service: General;  Laterality: N/A;  Rigid Proctoscopy/Possible Fistula in Ano  Procedure ended at 1003  . THERAPEUTIC ABORTION     x2    Family History  Problem Relation Age of Onset  . Kidney failure Daughter   . Sickle cell anemia Daughter   . Sickle cell anemia Son   . Hypertension Mother   . Hypertension Other   . Anesthesia problems Neg Hx   . Hypotension Neg  Hx   . Malignant hyperthermia Neg Hx   . Pseudochol deficiency Neg Hx     Social History Social History  Substance Use Topics  . Smoking status: Former Smoker    Packs/day: 0.50    Years: 30.00    Types: Cigarettes    Quit date: 04/21/2015  . Smokeless tobacco: Never Used  . Alcohol use No      Current Outpatient Prescriptions  Medication Sig Dispense Refill  . atorvastatin (LIPITOR) 20 MG tablet Take 1 tablet (20 mg total) by mouth daily. 90 tablet 2  . doxycycline (VIBRAMYCIN) 100 MG capsule Take 1 capsule (100 mg total) by mouth 2 (two) times daily. 20 capsule 0  . HYDROcodone-acetaminophen (NORCO) 7.5-325 MG tablet Take 1 tablet by mouth every 6 (six) hours as needed for moderate pain (Must last 30 days.Do not drive or operate machinery while taking this medicine.). 85 tablet 0  . lisinopril-hydrochlorothiazide (PRINZIDE,ZESTORETIC) 20-12.5 MG tablet Take 1 tablet by mouth daily.    . methocarbamol (ROBAXIN) 500 MG tablet Take 1 tablet (500 mg total) by mouth 4 (four) times daily. 28 tablet 1  . ranitidine (ZANTAC) 150 MG tablet Take 150 mg by mouth 2 (two) times daily as needed for heartburn.      No current facility-administered medications for this visit.      Physical Exam  Blood pressure 119/64, pulse 63,  temperature 97.9 F (36.6 C), height 5\' 8"  (1.727 m), weight 179 lb (81.2 kg), last menstrual period 07/18/2010.  Constitutional: overall normal hygiene, normal nutrition, well developed, normal grooming, normal body habitus. Assistive device:none  Musculoskeletal: gait and station Limp none, muscle tone and strength are normal, no tremors or atrophy is present.  .  Neurological: coordination overall normal.  Deep tendon reflex/nerve stretch intact.  Sensation normal.  Cranial nerves II-XII intact.   Skin:   Normal overall no scars, lesions, ulcers or rashes. No psoriasis.  Psychiatric: Alert and oriented x 3.  Recent memory intact, remote memory unclear.   Normal mood and affect. Well groomed.  Good eye contact.  Cardiovascular: overall no swelling, no varicosities, no edema bilaterally, normal temperatures of the legs and arms, no clubbing, cyanosis and good capillary refill.  Lymphatic: palpation is normal.  Both hands have no redness or swelling. Both hand have normal sensation and grip.  The patient has been educated about the nature of the problem(s) and counseled on treatment options.  The patient appeared to understand what I have discussed and is in agreement with it.  Encounter Diagnoses  Name Primary?  . Pain of left hand Yes  . Hand pain, right   . Essential hypertension, benign     PLAN Call if any problems.  Precautions discussed.  Continue current medications.   Return to clinic 3 months   Electronically Signed Sanjuana Kava, MD 11/28/201710:25 AM

## 2016-03-09 DIAGNOSIS — L089 Local infection of the skin and subcutaneous tissue, unspecified: Secondary | ICD-10-CM | POA: Diagnosis not present

## 2016-03-09 DIAGNOSIS — R739 Hyperglycemia, unspecified: Secondary | ICD-10-CM | POA: Diagnosis not present

## 2016-03-09 DIAGNOSIS — I10 Essential (primary) hypertension: Secondary | ICD-10-CM | POA: Diagnosis not present

## 2016-03-09 DIAGNOSIS — E785 Hyperlipidemia, unspecified: Secondary | ICD-10-CM | POA: Diagnosis not present

## 2016-03-09 DIAGNOSIS — R319 Hematuria, unspecified: Secondary | ICD-10-CM | POA: Diagnosis not present

## 2016-03-09 DIAGNOSIS — R300 Dysuria: Secondary | ICD-10-CM | POA: Diagnosis not present

## 2016-03-14 ENCOUNTER — Emergency Department (HOSPITAL_COMMUNITY)
Admission: EM | Admit: 2016-03-14 | Discharge: 2016-03-14 | Disposition: A | Discharge status: Home / Self Care | Payer: BLUE CROSS/BLUE SHIELD

## 2016-03-19 ENCOUNTER — Encounter (HOSPITAL_COMMUNITY): Payer: Self-pay | Admitting: Emergency Medicine

## 2016-03-19 ENCOUNTER — Emergency Department (HOSPITAL_COMMUNITY)
Admission: EM | Admit: 2016-03-19 | Discharge: 2016-03-19 | Disposition: A | Discharge status: Home / Self Care | Payer: BLUE CROSS/BLUE SHIELD | Attending: Emergency Medicine | Admitting: Emergency Medicine

## 2016-03-19 DIAGNOSIS — R110 Nausea: Secondary | ICD-10-CM | POA: Insufficient documentation

## 2016-03-19 DIAGNOSIS — N764 Abscess of vulva: Secondary | ICD-10-CM | POA: Insufficient documentation

## 2016-03-19 DIAGNOSIS — I10 Essential (primary) hypertension: Secondary | ICD-10-CM | POA: Diagnosis not present

## 2016-03-19 DIAGNOSIS — Z87891 Personal history of nicotine dependence: Secondary | ICD-10-CM | POA: Insufficient documentation

## 2016-03-19 DIAGNOSIS — R109 Unspecified abdominal pain: Secondary | ICD-10-CM | POA: Diagnosis not present

## 2016-03-19 LAB — CBC WITH DIFFERENTIAL/PLATELET
BASOS ABS: 0 10*3/uL (ref 0.0–0.1)
Basophils Relative: 0 %
Eosinophils Absolute: 0.1 10*3/uL (ref 0.0–0.7)
Eosinophils Relative: 1 %
HEMATOCRIT: 34.9 % — AB (ref 36.0–46.0)
HEMOGLOBIN: 11.5 g/dL — AB (ref 12.0–15.0)
LYMPHS PCT: 43 %
Lymphs Abs: 2.3 10*3/uL (ref 0.7–4.0)
MCH: 27.8 pg (ref 26.0–34.0)
MCHC: 33 g/dL (ref 30.0–36.0)
MCV: 84.3 fL (ref 78.0–100.0)
Monocytes Absolute: 0.3 10*3/uL (ref 0.1–1.0)
Monocytes Relative: 6 %
NEUTROS ABS: 2.7 10*3/uL (ref 1.7–7.7)
NEUTROS PCT: 50 %
PLATELETS: 391 10*3/uL (ref 150–400)
RBC: 4.14 MIL/uL (ref 3.87–5.11)
RDW: 13.2 % (ref 11.5–15.5)
WBC: 5.3 10*3/uL (ref 4.0–10.5)

## 2016-03-19 LAB — COMPREHENSIVE METABOLIC PANEL
ALK PHOS: 59 U/L (ref 38–126)
ALT: 15 U/L (ref 14–54)
AST: 17 U/L (ref 15–41)
Albumin: 4 g/dL (ref 3.5–5.0)
Anion gap: 6 (ref 5–15)
BILIRUBIN TOTAL: 0.3 mg/dL (ref 0.3–1.2)
BUN: 27 mg/dL — AB (ref 6–20)
CALCIUM: 9.6 mg/dL (ref 8.9–10.3)
CHLORIDE: 100 mmol/L — AB (ref 101–111)
CO2: 26 mmol/L (ref 22–32)
CREATININE: 1.06 mg/dL — AB (ref 0.44–1.00)
GFR calc Af Amer: >60 mL/min (ref >60)
GFR, EST NON AFRICAN AMERICAN: 58 mL/min — AB (ref >60)
Glucose, Bld: 97 mg/dL (ref 65–99)
Potassium: 4 mmol/L (ref 3.5–5.1)
Sodium: 132 mmol/L — ABNORMAL LOW (ref 135–145)
Total Protein: 8.3 g/dL — ABNORMAL HIGH (ref 6.5–8.1)

## 2016-03-19 LAB — URINALYSIS, ROUTINE W REFLEX MICROSCOPIC
Bilirubin Urine: NEGATIVE
GLUCOSE, UA: NEGATIVE mg/dL
Ketones, ur: NEGATIVE mg/dL
Leukocytes, UA: NEGATIVE
Nitrite: NEGATIVE
PROTEIN: NEGATIVE mg/dL
Specific Gravity, Urine: 1.01 (ref 1.005–1.030)
pH: 6 (ref 5.0–8.0)

## 2016-03-19 LAB — URINALYSIS, MICROSCOPIC (REFLEX)

## 2016-03-19 LAB — LIPASE, BLOOD: Lipase: 33 U/L (ref 11–51)

## 2016-03-19 MED ORDER — ONDANSETRON 4 MG PO TBDP: ORAL_TABLET | ORAL | 0 refills | Status: DC

## 2016-03-19 NOTE — Discharge Instructions (Signed)
Follow up with Dr. Elonda Husky to check your boil area

## 2016-03-19 NOTE — ED Triage Notes (Signed)
Abdominal pain, denies diarrhea, pt on antibiotic for abscess. Pt states she has decreases appetite and lost weight

## 2016-03-19 NOTE — ED Provider Notes (Signed)
[facility] DEPT Provider Note   CSN: [id number] Arrival date & time: 03/19/16  1140  By signing my name below, I, Judithe Modest, attest that this documentation has been prepared under the direction and in the presence of Milton Ferguson, MD. Electronically Signed: Judithe Modest, ER Scribe. 11/12/2015. 12:10 PM.   History   Chief Complaint Chief Complaint  Patient presents with  . Abdominal Pain   Patient complains of nausea. She is taking an antibiotic for a small abscess near her vagina   The history is provided by the patient. No language interpreter was used.  Abdominal Pain   This is a new problem. The current episode started 12 to 24 hours ago. The problem occurs rarely. The problem has been resolved. The pain is associated with an unknown factor. The pain is located in the epigastric region. The pain is at a severity of 1/10. The pain is mild. Associated symptoms include nausea. Pertinent negatives include diarrhea, frequency, hematuria and headaches. Nothing aggravates the symptoms. Nothing relieves the symptoms.    HPI Comments: Amber Drake is a 55 y.o. female who presents to the Emergency Department complaining of nausea since being put on abx after having multiple abscesses in her groin I&D. She is also complaining of left eye redness. She states she is on a sulfa abx.    Past Medical History:  Diagnosis Date  . Anemia   . Arthritis   . Blood dyscrasia    sickle cell trait  . Hypercholesterolemia   . Hypertension    does not take meds  . Sickle cell trait [facility])     Patient Active Problem List   Diagnosis Date Noted  . Hyperlipidemia 06/15/2015  . Pain of left hand 05/19/2015  . Hand pain, right 05/19/2015  . Hematuria 03/03/2015  . Esophageal reflux 03/03/2015  . Essential hypertension, benign 03/03/2015  . Bronchitis due to tobacco use ([place]) 10/17/2010    Past Surgical History:  Procedure Laterality Date  . APPENDECTOMY    . CARPAL TUNNEL  RELEASE  03/23/2011   Procedure: CARPAL TUNNEL RELEASE;  Surgeon: Sanjuana Kava;  Location: [facility];  Service: Orthopedics;  Laterality: Left;  . CARPAL TUNNEL RELEASE  05/10/2011   Procedure: CARPAL TUNNEL RELEASE;  Surgeon: Sanjuana Kava, MD;  Location: [facility];  Service: Orthopedics;  Laterality: Right;  . CESAREAN SECTION     x 2  . INCISION AND DRAINAGE PERIRECTAL ABSCESS  12/21/09  . PROCTOSCOPY  10/17/2010   Procedure: PROCTOSCOPY;  Surgeon: Scherry Ran;  Location: [facility];  Service: General;  Laterality: N/A;  Rigid Proctoscopy/Possible Fistula in Ano  Procedure ended at 1003  . THERAPEUTIC ABORTION     x2    OB History    Gravida Para Term Preterm AB Living   4 2 2   2 2    SAB TAB Ectopic Multiple Live Births                   Home Medications    Prior to Admission medications   Medication Sig Start Date End Date Taking? Authorizing Provider  atorvastatin (LIPITOR) 20 MG tablet Take 1 tablet (20 mg total) by mouth daily. 03/14/15   Soyla Dryer, PA-C  doxycycline (VIBRAMYCIN) 100 MG capsule Take 1 capsule (100 mg total) by mouth 2 (two) times daily. 12/28/15   Dorie Rank, MD  HYDROcodone-acetaminophen (NORCO) 7.5-325 MG tablet Take 1 tablet by mouth every 6 (six) hours as needed for moderate pain (Must last 30  days.Do not drive or operate machinery while taking this medicine.). 02/28/16   Sanjuana Kava, MD  lisinopril-hydrochlorothiazide (PRINZIDE,ZESTORETIC) 20-12.5 MG tablet Take 1 tablet by mouth daily.    Historical Provider, MD  methocarbamol (ROBAXIN) 500 MG tablet Take 1 tablet (500 mg total) by mouth 4 (four) times daily. 12/28/15   Dorie Rank, MD  ranitidine (ZANTAC) 150 MG tablet Take 150 mg by mouth 2 (two) times daily as needed for heartburn.     Historical Provider, MD    Family History Family History  Problem Relation Age of Onset  . Kidney failure Daughter   . Sickle cell anemia Daughter   . Sickle cell anemia Son   . Hypertension Mother   .  Hypertension Other   . Anesthesia problems Neg Hx   . Hypotension Neg Hx   . Malignant hyperthermia Neg Hx   . Pseudochol deficiency Neg Hx     Social History Social History  Substance Use Topics  . Smoking status: Former Smoker    Packs/day: 0.50    Years: 30.00    Types: Cigarettes    Quit date: 04/21/2015  . Smokeless tobacco: Never Used  . Alcohol use No     Allergies   Penicillins; Penicillins cross reactors; and Tape   Review of Systems Review of Systems  Constitutional: Negative for appetite change and fatigue.  HENT: Negative for congestion, ear discharge and sinus pressure.   Eyes: Negative for discharge.  Respiratory: Negative for cough.   Cardiovascular: Negative for chest pain.  Gastrointestinal: Positive for abdominal pain and nausea. Negative for diarrhea.  Genitourinary: Negative for frequency and hematuria.  Musculoskeletal: Negative for back pain.  Skin: Negative for rash.  Neurological: Negative for seizures and headaches.  Psychiatric/Behavioral: Negative for hallucinations.   At least 10pt or greater review of systems completed and are negative except where specified in the HPI.   Physical Exam Updated Vital Signs BP 135/69   Pulse 81   Temp 98.6 F (37 C) (Oral)   Resp 20   Ht 5\' 8"  (1.727 m)   Wt 181 lb 8 oz (82.3 kg)   LMP 07/18/2010   SpO2 100%   BMI 27.60 kg/m   Physical Exam  Constitutional: She is oriented to person, place, and time. She appears well-developed and well-nourished. No distress.  HENT:  Head: Normocephalic and atraumatic.  Eyes: Pupils are equal, round, and reactive to light.  Neck: Neck supple.  Cardiovascular: Normal rate.   Pulmonary/Chest: Effort normal. No respiratory distress.  Genitourinary:  Genitourinary Comments: Healing abscess to the inferior left side of the opening to her vagina  Musculoskeletal: Normal range of motion.  Neurological: She is alert and oriented to person, place, and time.  Coordination normal.  Skin: Skin is warm and dry. She is not diaphoretic.  Psychiatric: She has a normal mood and affect. Her behavior is normal.  Nursing note and vitals reviewed.    ED Treatments / Results  Labs (all labs ordered are listed, but only abnormal results are displayed) Labs Reviewed - No data to display  EKG  EKG Interpretation None       Radiology No results found.  Procedures Procedures (including critical care time)  Medications Ordered in ED Medications - No data to display   Initial Impression / Assessment and Plan / ED Course  I have reviewed the triage vital signs and the nursing notes.  Pertinent labs & imaging results that were available during my care of the patient were reviewed  by me and considered in my medical decision making (see chart for details).  Clinical Course     Patient with an abscess that seems to be healing. Patient states that she has had that drained 3 times. She will be referred to GYN for possible removal of this area. Nausea most likely related to antibiotic. Patient will finish the last pill and follow-up as needed  Final Clinical Impressions(s) / ED Diagnoses   Final diagnoses:  None    New Prescriptions New Prescriptions   No medications on file      The chart was scribed for me under my direct supervision.  I personally performed the history, physical, and medical decision making and all procedures in the evaluation of this patient.Milton Ferguson, MD 03/19/16 [phone]

## 2016-03-20 ENCOUNTER — Telehealth: Payer: Self-pay | Admitting: Orthopaedic Surgery

## 2016-03-20 MED ORDER — HYDROCODONE-ACETAMINOPHEN 7.5-325 MG PO TABS: 1.0000 | ORAL_TABLET | Freq: Four times a day (QID) | ORAL | 0 refills | Status: DC | PRN

## 2016-03-20 NOTE — Telephone Encounter (Signed)
Hydrocodone-Acetaminophen 7.5/325mg   Qty 85 Tablets

## 2016-04-03 ENCOUNTER — Encounter (HOSPITAL_COMMUNITY): Payer: Self-pay | Admitting: Emergency Medicine

## 2016-04-03 ENCOUNTER — Emergency Department (HOSPITAL_COMMUNITY)
Admission: EM | Admit: 2016-04-03 | Discharge: 2016-04-03 | Disposition: A | Discharge status: Home / Self Care | Payer: BLUE CROSS/BLUE SHIELD | Attending: Emergency Medicine | Admitting: Emergency Medicine

## 2016-04-03 DIAGNOSIS — Z20818 Contact with and (suspected) exposure to other bacterial communicable diseases: Secondary | ICD-10-CM

## 2016-04-03 DIAGNOSIS — I10 Essential (primary) hypertension: Secondary | ICD-10-CM | POA: Diagnosis not present

## 2016-04-03 DIAGNOSIS — J029 Acute pharyngitis, unspecified: Secondary | ICD-10-CM

## 2016-04-03 DIAGNOSIS — Z79899 Other long term (current) drug therapy: Secondary | ICD-10-CM | POA: Diagnosis not present

## 2016-04-03 DIAGNOSIS — J020 Streptococcal pharyngitis: Secondary | ICD-10-CM | POA: Diagnosis not present

## 2016-04-03 DIAGNOSIS — R05 Cough: Secondary | ICD-10-CM | POA: Diagnosis not present

## 2016-04-03 DIAGNOSIS — Z87891 Personal history of nicotine dependence: Secondary | ICD-10-CM | POA: Diagnosis not present

## 2016-04-03 MED ORDER — AZITHROMYCIN 250 MG PO TABS: 250.0000 mg | ORAL_TABLET | Freq: Every day | ORAL | 0 refills | Status: DC

## 2016-04-03 MED ORDER — CEPHALEXIN 500 MG PO CAPS: 500.0000 mg | ORAL_CAPSULE | Freq: Four times a day (QID) | ORAL | 0 refills | Status: DC

## 2016-04-03 MED ORDER — FLUTICASONE PROPIONATE 50 MCG/ACT NA SUSP: 2.0000 | Freq: Every day | NASAL | 2 refills | Status: DC

## 2016-04-03 MED ORDER — ACETAMINOPHEN 500 MG PO TABS
1000.0000 mg | ORAL_TABLET | Freq: Once | ORAL | Status: AC
  Administered 2016-04-03: 1000 mg via ORAL
  Filled 2016-04-03: qty 2

## 2016-04-03 MED ORDER — FLUTICASONE PROPIONATE 50 MCG/ACT NA SUSP: 2.0000 | Freq: Every day | NASAL | 0 refills | Status: DC

## 2016-04-03 NOTE — Discharge Instructions (Signed)
Given your recent exposure to strep throat and symptoms you have been prescribed an antibiotic, please take this as instructed.   You have been prescribed flonase for your nasal congestion and sinus pressure, use this as instructed.

## 2016-04-03 NOTE — ED Triage Notes (Signed)
Patient complains of cough and congestion that started today. States drainage from nose. States pressure in sinuses.

## 2016-04-03 NOTE — ED Notes (Signed)
Patient complaining of cough, facial pain, generalized body aches, and fever since last night. Denies taking cold or fever medication at this time. States she is coughing up white sputum.

## 2016-04-03 NOTE — ED Provider Notes (Signed)
[facility] DEPT Provider Note   CSN: [id number] Arrival date & time: 04/03/16  1021     History   Chief Complaint Chief Complaint  Patient presents with  . Cough    HPI Amber Drake is a 56 y.o. female presents to the emergency department with nasal congestion, sore throat, low-grade fever, recent exposure to strep (mother was diagnosed with strep and was given a Z-Pak yesterday). Patient's symptoms started last night. Patient states she has a minimal cough, occasionally coughs up clear mucus. Patient denies chest pain, shortness of breath, nausea, vomiting, diarrhea, constipation. Patient is no longer a smoker, states she quit one year ago, used to smoke half a pack per day. Patient states she is getting married this weekend and is concerned that her symptoms aren't going to get worse before for her wedding day. Patient requests antibiotics   HPI  Past Medical History:  Diagnosis Date  . Anemia   . Arthritis   . Blood dyscrasia    sickle cell trait  . Hypercholesterolemia   . Hypertension    does not take meds  . Sickle cell trait [facility])     Patient Active Problem List   Diagnosis Date Noted  . Hyperlipidemia 06/15/2015  . Pain of left hand 05/19/2015  . Hand pain, right 05/19/2015  . Hematuria 03/03/2015  . Esophageal reflux 03/03/2015  . Essential hypertension, benign 03/03/2015  . Bronchitis due to tobacco use ([place]) 10/17/2010    Past Surgical History:  Procedure Laterality Date  . APPENDECTOMY    . CARPAL TUNNEL RELEASE  03/23/2011   Procedure: CARPAL TUNNEL RELEASE;  Surgeon: Sanjuana Kava;  Location: [facility];  Service: Orthopedics;  Laterality: Left;  . CARPAL TUNNEL RELEASE  05/10/2011   Procedure: CARPAL TUNNEL RELEASE;  Surgeon: Sanjuana Kava, MD;  Location: [facility];  Service: Orthopedics;  Laterality: Right;  . CESAREAN SECTION     x 2  . INCISION AND DRAINAGE PERIRECTAL ABSCESS  12/21/09  . PROCTOSCOPY  10/17/2010   Procedure: PROCTOSCOPY;  Surgeon:  Scherry Ran;  Location: [facility];  Service: General;  Laterality: N/A;  Rigid Proctoscopy/Possible Fistula in Ano  Procedure ended at 1003  . THERAPEUTIC ABORTION     x2    OB History    Gravida Para Term Preterm AB Living   4 2 2   2 2    SAB TAB Ectopic Multiple Live Births                   Home Medications    Prior to Admission medications   Medication Sig Start Date End Date Taking? Authorizing Provider  atorvastatin (LIPITOR) 20 MG tablet Take 1 tablet (20 mg total) by mouth daily. 03/14/15   Soyla Dryer, PA-C  azithromycin (ZITHROMAX Z-PAK) 250 MG tablet Take 1 tablet (250 mg total) by mouth daily. Take 2 tabs (500mg ) on day 1, followed by 1 tab on days 2 through 5. 04/03/16   Kinnie Feil, PA-C  fluticasone [facility]) 50 MCG/ACT nasal spray Place 2 sprays into both nostrils daily. 04/03/16   Kinnie Feil, PA-C  fluticasone (FLONASE) 50 MCG/ACT nasal spray Place 2 sprays into both nostrils daily. 04/03/16   Kinnie Feil, PA-C  HYDROcodone-acetaminophen (NORCO) 7.5-325 MG tablet Take 1 tablet by mouth every 6 (six) hours as needed for moderate pain (Must last 30 days.Do not drive or operate machinery while taking this medicine.). 03/20/16   Sanjuana Kava, MD  lisinopril-hydrochlorothiazide (PRINZIDE,ZESTORETIC) 20-12.5 MG tablet Take 1  tablet by mouth daily.    Historical Provider, MD  methocarbamol (ROBAXIN) 500 MG tablet Take 1 tablet (500 mg total) by mouth 4 (four) times daily. Patient taking differently: Take 500 mg by mouth daily as needed for muscle spasms.  12/28/15   Dorie Rank, MD  Multiple Vitamin (MULTIVITAMIN WITH MINERALS) TABS tablet Take 1 tablet by mouth daily.    Historical Provider, MD  ondansetron (ZOFRAN ODT) 4 MG disintegrating tablet 4mg  ODT q4 hours prn nausea/vomit 03/19/16   Milton Ferguson, MD  ranitidine (ZANTAC) 150 MG tablet Take 150 mg by mouth 2 (two) times daily.     Historical Provider, MD  sulfamethoxazole-trimethoprim (BACTRIM  DS,SEPTRA DS) 800-160 MG tablet Take 1 tablet by mouth 2 (two) times daily. 03/09/16   Historical Provider, MD    Family History Family History  Problem Relation Age of Onset  . Kidney failure Daughter   . Sickle cell anemia Daughter   . Sickle cell anemia Son   . Hypertension Mother   . Hypertension Other   . Anesthesia problems Neg Hx   . Hypotension Neg Hx   . Malignant hyperthermia Neg Hx   . Pseudochol deficiency Neg Hx     Social History Social History  Substance Use Topics  . Smoking status: Former Smoker    Packs/day: 0.50    Years: 30.00    Types: Cigarettes    Quit date: 04/21/2015  . Smokeless tobacco: Never Used  . Alcohol use No     Allergies   Penicillins; Penicillins cross reactors; and Tape   Review of Systems Review of Systems  Constitutional: Negative for appetite change, chills and fever.  HENT: Positive for congestion, postnasal drip, rhinorrhea, sinus pressure and sore throat. Negative for ear pain, sinus pain, trouble swallowing and voice change.   Eyes: Negative for visual disturbance.  Respiratory: Negative for cough, choking, chest tightness and shortness of breath.   Cardiovascular: Negative for chest pain, palpitations and leg swelling.  Gastrointestinal: Negative for abdominal pain, constipation, diarrhea, nausea and vomiting.  Genitourinary: Negative for difficulty urinating and hematuria.  Musculoskeletal: Negative for arthralgias.  Skin: Negative for rash and wound.  Neurological: Negative for dizziness, seizures, syncope, weakness, light-headedness, numbness and headaches.  Hematological: Does not bruise/bleed easily.  Psychiatric/Behavioral: Negative.      Physical Exam Updated Vital Signs BP 133/80 (BP Location: Left Arm)   Pulse 100   Temp 99.3 F (37.4 C) (Oral)   Resp 18   Ht 5\' 8"  (1.727 m)   Wt 82.1 kg   LMP 07/18/2010   SpO2 99%   BMI 27.52 kg/m   Physical Exam  Constitutional: She is oriented to person, place,  and time. She appears well-developed and well-nourished. No distress.  HENT:  Head: Normocephalic and atraumatic.  Nose: Nose normal.  Mouth/Throat: Oropharynx is clear and moist. No oropharyngeal exudate.  Mildly erythematous oropharynx and tonsils without exudates or edema.  Uvula is midline. No trismus. No pooling of oral secretions. No hot potato voice.   Eyes: Conjunctivae and EOM are normal. Pupils are equal, round, and reactive to light. No scleral icterus.  Neck: Normal range of motion. Neck supple. No JVD present.  Cardiovascular: Normal rate, regular rhythm, normal heart sounds and intact distal pulses.   No murmur heard. Pulmonary/Chest: Effort normal and breath sounds normal. No respiratory distress. She has no wheezes. She has no rales. She exhibits no tenderness.  Abdominal: Soft. Bowel sounds are normal. She exhibits no distension and no mass. There  is no tenderness. There is no rebound and no guarding.  Musculoskeletal: Normal range of motion. She exhibits no deformity.  Lymphadenopathy:    She has no cervical adenopathy.  Neurological: She is alert and oriented to person, place, and time. No sensory deficit.  Skin: Skin is warm and dry. Capillary refill takes less than 2 seconds.  Psychiatric: She has a normal mood and affect. Her behavior is normal. Judgment and thought content normal.  Nursing note and vitals reviewed.    ED Treatments / Results  Labs (all labs ordered are listed, but only abnormal results are displayed) Labs Reviewed - No data to display  EKG  EKG Interpretation None       Radiology No results found.  Procedures Procedures (including critical care time)  Medications Ordered in ED Medications  acetaminophen (TYLENOL) tablet 1,000 mg (1,000 mg Oral Given 04/03/16 1217)     Initial Impression / Assessment and Plan / ED Course  I have reviewed the triage vital signs and the nursing notes.  Pertinent labs & imaging results that were  available during my care of the patient were reviewed by me and considered in my medical decision making (see chart for details).  Clinical Course    Vital signs reassuring. Physical exam remarkable for mildly erythematous oropharynx and tonsils without exudates or edema, uvula is midline, no trismus, no hot potato voice. Doubt deep tissue neck abscess. Given recent exposure to strep I think it's reasonable to treat the patient empirically at this time. Patient reports severe allergy to penicillin, will  treat with azithromycin.  Patient will be discharged with antibiotic, Flonase, fluids, rest, ibuprofen/Tylenol for sore throat. Patient is aware that this has a chance of developing into a tonsillar abscess or deep tissue neck abscess. Strict ED return instructions given, patient verbalized understanding and is agreeable to discharge plan.   Final Clinical Impressions(s) / ED Diagnoses   Final diagnoses:  Sore throat  Exposure to group A Streptococcus    New Prescriptions Discharge Medication List as of 04/03/2016 12:40 PM    START taking these medications   Details  azithromycin (ZITHROMAX Z-PAK) 250 MG tablet Take 1 tablet (250 mg total) by mouth daily. Take 2 tabs (500mg ) on day 1, followed by 1 tab on days 2 through 5., Starting Tue 04/03/2016, Print    !! fluticasone (FLONASE) 50 MCG/ACT nasal spray Place 2 sprays into both nostrils daily., Starting Tue 04/03/2016, Print    !! fluticasone (FLONASE) 50 MCG/ACT nasal spray Place 2 sprays into both nostrils daily., Starting Tue 04/03/2016, Print     !! - Potential duplicate medications found. Please discuss with provider.       Kinnie Feil, PA-C 04/03/16 [place], MD 04/04/16 1032

## 2016-04-18 ENCOUNTER — Emergency Department (HOSPITAL_COMMUNITY)
Admission: EM | Admit: 2016-04-18 | Discharge: 2016-04-18 | Disposition: A | Discharge status: Home / Self Care | Payer: BLUE CROSS/BLUE SHIELD | Attending: Emergency Medicine | Admitting: Emergency Medicine

## 2016-04-18 ENCOUNTER — Encounter (HOSPITAL_COMMUNITY): Payer: Self-pay | Admitting: Emergency Medicine

## 2016-04-18 DIAGNOSIS — Z87891 Personal history of nicotine dependence: Secondary | ICD-10-CM | POA: Insufficient documentation

## 2016-04-18 DIAGNOSIS — K610 Anal abscess: Secondary | ICD-10-CM | POA: Diagnosis not present

## 2016-04-18 DIAGNOSIS — L02215 Cutaneous abscess of perineum: Secondary | ICD-10-CM | POA: Diagnosis not present

## 2016-04-18 DIAGNOSIS — L089 Local infection of the skin and subcutaneous tissue, unspecified: Secondary | ICD-10-CM | POA: Diagnosis not present

## 2016-04-18 DIAGNOSIS — I10 Essential (primary) hypertension: Secondary | ICD-10-CM | POA: Diagnosis not present

## 2016-04-18 DIAGNOSIS — Z79899 Other long term (current) drug therapy: Secondary | ICD-10-CM | POA: Diagnosis not present

## 2016-04-18 MED ORDER — DOXYCYCLINE HYCLATE 100 MG PO TABS
100.0000 mg | ORAL_TABLET | Freq: Once | ORAL | Status: AC
  Administered 2016-04-18: 100 mg via ORAL
  Filled 2016-04-18: qty 1

## 2016-04-18 MED ORDER — DOXYCYCLINE HYCLATE 100 MG PO CAPS: 100.0000 mg | ORAL_CAPSULE | Freq: Two times a day (BID) | ORAL | 0 refills | Status: DC

## 2016-04-18 NOTE — ED Provider Notes (Signed)
[facility] DEPT Provider Note   CSN: [id number] Arrival date & time: 04/18/16  1418     History   Chief Complaint Chief Complaint  Patient presents with  . Recurrent Skin Infections    vagina    HPI Amber Drake is a 56 y.o. female.  Patient complains of recurrent boils in her perineal area. This is not a new problem. No fever, sweats, or chills. She is ambulatory. Some of the inflamed area has leaked a small amount of pus.      Past Medical History:  Diagnosis Date  . Anemia   . Arthritis   . Blood dyscrasia    sickle cell trait  . Hypercholesterolemia   . Hypertension    does not take meds  . Sickle cell trait [facility])     Patient Active Problem List   Diagnosis Date Noted  . Hyperlipidemia 06/15/2015  . Pain of left hand 05/19/2015  . Hand pain, right 05/19/2015  . Hematuria 03/03/2015  . Esophageal reflux 03/03/2015  . Essential hypertension, benign 03/03/2015  . Bronchitis due to tobacco use ([place]) 10/17/2010    Past Surgical History:  Procedure Laterality Date  . APPENDECTOMY    . CARPAL TUNNEL RELEASE  03/23/2011   Procedure: CARPAL TUNNEL RELEASE;  Surgeon: Sanjuana Kava;  Location: [facility];  Service: Orthopedics;  Laterality: Left;  . CARPAL TUNNEL RELEASE  05/10/2011   Procedure: CARPAL TUNNEL RELEASE;  Surgeon: Sanjuana Kava, MD;  Location: [facility];  Service: Orthopedics;  Laterality: Right;  . CESAREAN SECTION     x 2  . INCISION AND DRAINAGE PERIRECTAL ABSCESS  12/21/09  . PROCTOSCOPY  10/17/2010   Procedure: PROCTOSCOPY;  Surgeon: Scherry Ran;  Location: [facility];  Service: General;  Laterality: N/A;  Rigid Proctoscopy/Possible Fistula in Ano  Procedure ended at 1003  . THERAPEUTIC ABORTION     x2    OB History    Gravida Para Term Preterm AB Living   4 2 2   2 2    SAB TAB Ectopic Multiple Live Births                   Home Medications    Prior to Admission medications   Medication Sig Start Date End Date Taking?  Authorizing Provider  lisinopril-hydrochlorothiazide (PRINZIDE,ZESTORETIC) 20-12.5 MG tablet Take 1 tablet by mouth daily.   Yes Historical Provider, MD  methocarbamol (ROBAXIN) 500 MG tablet Take 1 tablet (500 mg total) by mouth 4 (four) times daily. Patient taking differently: Take 500 mg by mouth daily as needed for muscle spasms.  12/28/15  Yes Dorie Rank, MD  Multiple Vitamin (MULTIVITAMIN WITH MINERALS) TABS tablet Take 1 tablet by mouth daily.   Yes Historical Provider, MD  ranitidine (ZANTAC) 150 MG tablet Take 150 mg by mouth 2 (two) times daily.    Yes Historical Provider, MD  atorvastatin (LIPITOR) 20 MG tablet Take 1 tablet (20 mg total) by mouth daily. 03/14/15   Soyla Dryer, PA-C  azithromycin (ZITHROMAX Z-PAK) 250 MG tablet Take 1 tablet (250 mg total) by mouth daily. Take 2 tabs (500mg ) on day 1, followed by 1 tab on days 2 through 5. 04/03/16   Kinnie Feil, PA-C  doxycycline (VIBRAMYCIN) 100 MG capsule Take 1 capsule (100 mg total) by mouth 2 (two) times daily. 04/18/16   Nat Christen, MD  fluticasone (FLONASE) 50 MCG/ACT nasal spray Place 2 sprays into both nostrils daily. 04/03/16   Kinnie Feil, PA-C  fluticasone [facility])  50 MCG/ACT nasal spray Place 2 sprays into both nostrils daily. 04/03/16   Kinnie Feil, PA-C  HYDROcodone-acetaminophen (NORCO) 7.5-325 MG tablet Take 1 tablet by mouth every 6 (six) hours as needed for moderate pain (Must last 30 days.Do not drive or operate machinery while taking this medicine.). 03/20/16   Sanjuana Kava, MD  ondansetron (ZOFRAN ODT) 4 MG disintegrating tablet 4mg  ODT q4 hours prn nausea/vomit 03/19/16   Milton Ferguson, MD  sulfamethoxazole-trimethoprim (BACTRIM DS,SEPTRA DS) 800-160 MG tablet Take 1 tablet by mouth 2 (two) times daily. 03/09/16   Historical Provider, MD    Family History Family History  Problem Relation Age of Onset  . Kidney failure Daughter   . Sickle cell anemia Daughter   . Sickle cell anemia Son   .  Hypertension Mother   . Hypertension Other   . Anesthesia problems Neg Hx   . Hypotension Neg Hx   . Malignant hyperthermia Neg Hx   . Pseudochol deficiency Neg Hx     Social History Social History  Substance Use Topics  . Smoking status: Former Smoker    Packs/day: 0.50    Years: 30.00    Types: Cigarettes    Quit date: 04/21/2015  . Smokeless tobacco: Never Used  . Alcohol use No     Allergies   Penicillins; Penicillins cross reactors; and Tape   Review of Systems Review of Systems  All other systems reviewed and are negative.    Physical Exam Updated Vital Signs BP 116/76 (BP Location: Right Arm)   Pulse 71   Temp 98.1 F (36.7 C) (Oral)   Resp 18   Ht 5\' 7"  (1.702 m)   Wt 182 lb (82.6 kg)   LMP 07/18/2010   SpO2 99%   BMI 28.51 kg/m   Physical Exam  Constitutional: She is oriented to person, place, and time. She appears well-developed and well-nourished.  HENT:  Head: Normocephalic and atraumatic.  Eyes: Conjunctivae are normal.  Neck: Neck supple.  Genitourinary:  Genitourinary Comments: Bilateral small abscesses right greater than left in the perineum between the vagina and rectum. No perirectal abscess.  Musculoskeletal: Normal range of motion.  Neurological: She is alert and oriented to person, place, and time.  Skin: Skin is warm and dry.  Psychiatric: She has a normal mood and affect. Her behavior is normal.  Nursing note and vitals reviewed.    ED Treatments / Results  Labs (all labs ordered are listed, but only abnormal results are displayed) Labs Reviewed - No data to display  EKG  EKG Interpretation None       Radiology No results found.  Procedures Procedures (including critical care time)  Medications Ordered in ED Medications  doxycycline (VIBRA-TABS) tablet 100 mg (100 mg Oral Given 04/18/16 1525)     Initial Impression / Assessment and Plan / ED Course  I have reviewed the triage vital signs and the nursing  notes.  Pertinent labs & imaging results that were available during my care of the patient were reviewed by me and considered in my medical decision making (see chart for details).  Clinical Course     Patient does not want an incision and drainage procedure. Will Rx doxycycline 100 mg twice a day. Recommend warm soaks. She will return if worse.  Final Clinical Impressions(s) / ED Diagnoses   Final diagnoses:  Perineal abscess    New Prescriptions New Prescriptions   DOXYCYCLINE (VIBRAMYCIN) 100 MG CAPSULE    Take 1 capsule (100 mg  total) by mouth 2 (two) times daily.     Nat Christen, MD 04/18/16 [phone]

## 2016-04-18 NOTE — Discharge Instructions (Signed)
Soak in warm water. Good hygiene. Clean underwear daily. Prescription for antibiotic.

## 2016-04-18 NOTE — ED Triage Notes (Signed)
Boil to vaginal area.  Denies pain.

## 2016-04-23 ENCOUNTER — Telehealth: Payer: Self-pay | Admitting: Orthopaedic Surgery

## 2016-04-23 NOTE — Telephone Encounter (Signed)
Patient requests a refill on Hydrocodone/Acetaminophen 7.5-325  Mgs.   Qty  75   Sig: Take 1 tablet by mouth every 6 (six) hours as needed for moderate pain (Must last 30 days.Do not drive or operate machinery while taking this medicine.).

## 2016-04-24 MED ORDER — HYDROCODONE-ACETAMINOPHEN 7.5-325 MG PO TABS: 1.0000 | ORAL_TABLET | Freq: Four times a day (QID) | ORAL | 0 refills | Status: DC | PRN

## 2016-05-28 ENCOUNTER — Telehealth: Payer: Self-pay | Admitting: Orthopedic Surgery

## 2016-05-28 MED ORDER — HYDROCODONE-ACETAMINOPHEN 7.5-325 MG PO TABS: 1.0000 | ORAL_TABLET | Freq: Four times a day (QID) | ORAL | 0 refills | Status: DC | PRN

## 2016-05-28 NOTE — Telephone Encounter (Signed)
Patient requests refill on Hydrocodone/Acetaminophen 7.5-325 mgs.   Qty  45   Sig: Take 1 tablet by mouth every 6 (six) hours as needed for moderate pain (Must last 30 days.Do not drive or operate machinery while taking this medicine.).

## 2016-05-30 ENCOUNTER — Ambulatory Visit: Payer: BLUE CROSS/BLUE SHIELD | Admitting: Orthopaedic Surgery

## 2016-06-04 DIAGNOSIS — I10 Essential (primary) hypertension: Secondary | ICD-10-CM | POA: Diagnosis not present

## 2016-06-04 DIAGNOSIS — E785 Hyperlipidemia, unspecified: Secondary | ICD-10-CM | POA: Diagnosis not present

## 2016-06-04 DIAGNOSIS — L089 Local infection of the skin and subcutaneous tissue, unspecified: Secondary | ICD-10-CM | POA: Diagnosis not present

## 2016-06-07 ENCOUNTER — Encounter (HOSPITAL_COMMUNITY): Payer: Self-pay | Admitting: Emergency Medicine

## 2016-06-07 ENCOUNTER — Emergency Department (HOSPITAL_COMMUNITY)
Admission: EM | Admit: 2016-06-07 | Discharge: 2016-06-07 | Disposition: A | Discharge status: Home / Self Care | Payer: BLUE CROSS/BLUE SHIELD | Attending: Emergency Medicine | Admitting: Emergency Medicine

## 2016-06-07 DIAGNOSIS — Z87891 Personal history of nicotine dependence: Secondary | ICD-10-CM | POA: Diagnosis not present

## 2016-06-07 DIAGNOSIS — Y999 Unspecified external cause status: Secondary | ICD-10-CM | POA: Insufficient documentation

## 2016-06-07 DIAGNOSIS — S29019A Strain of muscle and tendon of unspecified wall of thorax, initial encounter: Secondary | ICD-10-CM

## 2016-06-07 DIAGNOSIS — Z79899 Other long term (current) drug therapy: Secondary | ICD-10-CM | POA: Diagnosis not present

## 2016-06-07 DIAGNOSIS — I10 Essential (primary) hypertension: Secondary | ICD-10-CM | POA: Insufficient documentation

## 2016-06-07 DIAGNOSIS — Y929 Unspecified place or not applicable: Secondary | ICD-10-CM | POA: Diagnosis not present

## 2016-06-07 DIAGNOSIS — S29012A Strain of muscle and tendon of back wall of thorax, initial encounter: Secondary | ICD-10-CM | POA: Insufficient documentation

## 2016-06-07 DIAGNOSIS — X500XXA Overexertion from strenuous movement or load, initial encounter: Secondary | ICD-10-CM | POA: Diagnosis not present

## 2016-06-07 DIAGNOSIS — S299XXA Unspecified injury of thorax, initial encounter: Secondary | ICD-10-CM | POA: Diagnosis not present

## 2016-06-07 DIAGNOSIS — Y9389 Activity, other specified: Secondary | ICD-10-CM | POA: Insufficient documentation

## 2016-06-07 MED ORDER — CYCLOBENZAPRINE HCL 10 MG PO TABS: 10.0000 mg | ORAL_TABLET | Freq: Three times a day (TID) | ORAL | 0 refills | Status: DC

## 2016-06-07 MED ORDER — KETOROLAC TROMETHAMINE 10 MG PO TABS
10.0000 mg | ORAL_TABLET | Freq: Once | ORAL | Status: AC
  Administered 2016-06-07: 10 mg via ORAL
  Filled 2016-06-07: qty 1

## 2016-06-07 MED ORDER — DICLOFENAC SODIUM 75 MG PO TBEC: 75.0000 mg | DELAYED_RELEASE_TABLET | Freq: Two times a day (BID) | ORAL | 0 refills | Status: DC

## 2016-06-07 MED ORDER — CYCLOBENZAPRINE HCL 10 MG PO TABS
10.0000 mg | ORAL_TABLET | Freq: Once | ORAL | Status: AC
  Administered 2016-06-07: 10 mg via ORAL
  Filled 2016-06-07: qty 1

## 2016-06-07 NOTE — Discharge Instructions (Signed)
Your vital signs are essentially within normal limits, with exception of your blood pressure being slightly elevated. Your electrocardiogram shows no evidence of acute cardiac event. Your examination favors a musculoskeletal strain involving the mid to upper back area left greater than right. Please use a heating pad to this area. Please add Flexeril and diclofenac to your current medications. Please see Dr. Aline Brochure for orthopedic evaluation if this is not improving.

## 2016-06-07 NOTE — ED Provider Notes (Signed)
[facility] DEPT Provider Note   CSN: [id number] Arrival date & time: 06/07/16  1813     History   Chief Complaint Chief Complaint  Patient presents with  . Back Pain    HPI Amber Drake is a 56 y.o. female.  Patient is a 56 year old female who presents to the emergency department with a complaint of mid to upper back pain, and left arm pain.  The patient states that on yesterday she had some left arm and shoulder area pain. This afternoon she took canal, and when she woke up she had mid to upper back pain mostly on the left side. The patient states that she does lifting, pushing, pulling. She denies any known injury. She's not had any recent operations or procedures involving her shoulder. Patient has a history of arthritis. She does not recall any hot joints recently. His been no recent fever or chills reported. Patient presents now for assessment of her back area pain as well as her arm pain.    Back Pain      Past Medical History:  Diagnosis Date  . Anemia   . Arthritis   . Blood dyscrasia    sickle cell trait  . Hypercholesterolemia   . Hypertension    does not take meds  . Sickle cell trait [facility])     Patient Active Problem List   Diagnosis Date Noted  . Hyperlipidemia 06/15/2015  . Pain of left hand 05/19/2015  . Hand pain, right 05/19/2015  . Hematuria 03/03/2015  . Esophageal reflux 03/03/2015  . Essential hypertension, benign 03/03/2015  . Bronchitis due to tobacco use ([place]) 10/17/2010    Past Surgical History:  Procedure Laterality Date  . APPENDECTOMY    . CARPAL TUNNEL RELEASE  03/23/2011   Procedure: CARPAL TUNNEL RELEASE;  Surgeon: Sanjuana Kava;  Location: [facility] OR[facility];  Service: Orthopedics;  Laterality: Left;  . CARPAL TUNNEL RELEASE  05/10/2011   Procedure: CARPAL TUNNEL RELEASE;  Surgeon: Sanjuana Kava, MD;  Location: [facility] ORS;  Service: Orthopedics;  Laterality: Right;  . CESAREAN SECTION     x 2  . INCISION AND DRAINAGE PERIRECTAL ABSCESS   12/21/09  . PROCTOSCOPY  10/17/2010   Procedure: PROCTOSCOPY;  Surgeon: Scherry Ran;  Location: [facility] ORS;  Service: General;  Laterality: N/A;  Rigid Proctoscopy/Possible Fistula in Ano  Procedure ended at 1003  . THERAPEUTIC ABORTION     x2    OB History    Gravida Para Term Preterm AB Living   4 2 2   2 2    SAB TAB Ectopic Multiple Live Births                   Home Medications    Prior to Admission medications   Medication Sig Start Date End Date Taking? Authorizing Provider  atorvastatin (LIPITOR) 20 MG tablet Take 1 tablet (20 mg total) by mouth daily. 03/14/15   Soyla Dryer, PA-C  azithromycin (ZITHROMAX Z-PAK) 250 MG tablet Take 1 tablet (250 mg total) by mouth daily. Take 2 tabs (500mg ) on day 1, followed by 1 tab on days 2 through 5. 04/03/16   Kinnie Feil, PA-C  doxycycline (VIBRAMYCIN) 100 MG capsule Take 1 capsule (100 mg total) by mouth 2 (two) times daily. 04/18/16   Nat Christen, MD  fluticasone (FLONASE) 50 MCG/ACT nasal spray Place 2 sprays into both nostrils daily. 04/03/16   Kinnie Feil, PA-C  fluticasone (FLONASE) 50 MCG/ACT nasal spray Place 2 sprays into both  nostrils daily. 04/03/16   Kinnie Feil, PA-C  HYDROcodone-acetaminophen (NORCO) 7.5-325 MG tablet Take 1 tablet by mouth every 6 (six) hours as needed for moderate pain (Must last 30 days.Do not drive or operate machinery while taking this medicine.). 05/28/16   Sanjuana Kava, MD  lisinopril-hydrochlorothiazide (PRINZIDE,ZESTORETIC) 20-12.5 MG tablet Take 1 tablet by mouth daily.    Historical Provider, MD  methocarbamol (ROBAXIN) 500 MG tablet Take 1 tablet (500 mg total) by mouth 4 (four) times daily. Patient taking differently: Take 500 mg by mouth daily as needed for muscle spasms.  12/28/15   Dorie Rank, MD  Multiple Vitamin (MULTIVITAMIN WITH MINERALS) TABS tablet Take 1 tablet by mouth daily.    Historical Provider, MD  ondansetron (ZOFRAN ODT) 4 MG disintegrating tablet 4mg  ODT q4  hours prn nausea/vomit 03/19/16   Milton Ferguson, MD  ranitidine (ZANTAC) 150 MG tablet Take 150 mg by mouth 2 (two) times daily.     Historical Provider, MD  sulfamethoxazole-trimethoprim (BACTRIM DS,SEPTRA DS) 800-160 MG tablet Take 1 tablet by mouth 2 (two) times daily. 03/09/16   Historical Provider, MD    Family History Family History  Problem Relation Age of Onset  . Kidney failure Daughter   . Sickle cell anemia Daughter   . Sickle cell anemia Son   . Hypertension Mother   . Hypertension Other   . Anesthesia problems Neg Hx   . Hypotension Neg Hx   . Malignant hyperthermia Neg Hx   . Pseudochol deficiency Neg Hx     Social History Social History  Substance Use Topics  . Smoking status: Former Smoker    Packs/day: 0.50    Years: 30.00    Types: Cigarettes    Quit date: 04/21/2015  . Smokeless tobacco: Never Used  . Alcohol use No     Allergies   Penicillins; Penicillins cross reactors; and Tape   Review of Systems Review of Systems  Musculoskeletal: Positive for arthralgias and back pain.  All other systems reviewed and are negative.    Physical Exam Updated Vital Signs BP 120/98 (BP Location: Right Arm)   Pulse 76   Temp 98.8 F (37.1 C) (Oral)   Resp 18   Ht 5\' 9"  (1.753 m)   Wt 82.1 kg   LMP 08/12/2010   SpO2 99%   BMI 26.73 kg/m   Physical Exam  Constitutional: She is oriented to person, place, and time. She appears well-developed and well-nourished.  Non-toxic appearance.  HENT:  Head: Normocephalic.  Right Ear: Tympanic membrane and external ear normal.  Left Ear: Tympanic membrane and external ear normal.  Eyes: EOM and lids are normal. Pupils are equal, round, and reactive to light.  Neck: Normal range of motion. Neck supple. Carotid bruit is not present.  Cardiovascular: Normal rate, regular rhythm, normal heart sounds, intact distal pulses and normal pulses.  Exam reveals no gallop and no friction rub.   No murmur heard. Pulmonary/Chest:  Breath sounds normal. No respiratory distress.  Abdominal: Soft. Bowel sounds are normal. There is no tenderness. There is no guarding.  Musculoskeletal: Normal range of motion.  There is a healing area of the dorsum of the left ring finger. Scab formation is in place. There is no red streaking appreciated. And there is no drainage at this time. The patient has full range of motion of all fingers of the left hand. There is full range of motion of the left shoulder. There is soreness that can easily be reproduced with attempted  range of motion of the left shoulder. There is no evidence for dislocation on. There is no hot joint appreciated.  There is no palpable step off of the cervical, thoracic, or lumbar spine. There is left paraspinal area pain in the mid thoracic area extending to the left trapezius area.  Lymphadenopathy:       Head (right side): No submandibular adenopathy present.       Head (left side): No submandibular adenopathy present.    She has no cervical adenopathy.  Neurological: She is alert and oriented to person, place, and time. She has normal strength. No cranial nerve deficit or sensory deficit.  Gait is steady. There no gross motor or sensory deficits appreciated of the upper or lower extremity. Patient has no problem with balance.  Skin: Skin is warm and dry.  Psychiatric: She has a normal mood and affect. Her speech is normal.  Nursing note and vitals reviewed.    ED Treatments / Results  Labs (all labs ordered are listed, but only abnormal results are displayed) Labs Reviewed - No data to display  EKG  EKG Interpretation None       Radiology No results found.  Procedures Procedures (including critical care time)  Medications Ordered in ED Medications - No data to display   Initial Impression / Assessment and Plan / ED Course  I have reviewed the triage vital signs and the nursing notes.  Pertinent labs & imaging results that were available during  my care of the patient were reviewed by me and considered in my medical decision making (see chart for details).     *I have reviewed nursing notes, vital signs, and all appropriate lab and imaging results for this patient.**  Final Clinical Impressions(s) / ED Diagnoses  MDM Blood pressure is 120/98, otherwise vital signs are well within normal limits. Pulse oximetry is 99% on room air. The electrocardiogram shows a normal sinus rhythm at 59 bpm. No acute or life-threatening arrhythmias noted on the electrocardiogram evaluation.  No gross neurologic deficits appreciated on examination. The pain can be reproduced with range of motion involving the shoulder and upper back area. I suspect a musculoskeletal discomfort. The patient is already on medication for pain. Will add a muscle relaxer and anti-inflammatory medication. The patient is seen by Dr. Aline Brochure. I've asked the patient to see Dr. Aline Brochure for additional evaluation if not improving. Patient is in agreement with this plan.    Final diagnoses:  None    New Prescriptions New Prescriptions   No medications on file     Lily Kocher, Hershal Coria 06/07/16 2021    Noemi Chapel, MD 06/09/16 1026

## 2016-06-07 NOTE — ED Triage Notes (Signed)
PT states upper/middle back pain and left arm pain that started after waking up from a nap this evening.

## 2016-06-25 ENCOUNTER — Encounter (HOSPITAL_COMMUNITY): Payer: Self-pay | Admitting: *Deleted

## 2016-06-25 ENCOUNTER — Emergency Department (HOSPITAL_COMMUNITY)
Admission: EM | Admit: 2016-06-25 | Discharge: 2016-06-25 | Disposition: A | Discharge status: Home / Self Care | Payer: BLUE CROSS/BLUE SHIELD | Attending: Emergency Medicine | Admitting: Emergency Medicine

## 2016-06-25 ENCOUNTER — Emergency Department (HOSPITAL_COMMUNITY): Payer: BLUE CROSS/BLUE SHIELD

## 2016-06-25 ENCOUNTER — Telehealth: Payer: Self-pay | Admitting: Orthopaedic Surgery

## 2016-06-25 DIAGNOSIS — R079 Chest pain, unspecified: Secondary | ICD-10-CM | POA: Diagnosis not present

## 2016-06-25 DIAGNOSIS — Z87891 Personal history of nicotine dependence: Secondary | ICD-10-CM | POA: Diagnosis not present

## 2016-06-25 DIAGNOSIS — R110 Nausea: Secondary | ICD-10-CM | POA: Insufficient documentation

## 2016-06-25 DIAGNOSIS — R072 Precordial pain: Secondary | ICD-10-CM | POA: Insufficient documentation

## 2016-06-25 DIAGNOSIS — Z79899 Other long term (current) drug therapy: Secondary | ICD-10-CM | POA: Insufficient documentation

## 2016-06-25 DIAGNOSIS — I10 Essential (primary) hypertension: Secondary | ICD-10-CM | POA: Diagnosis not present

## 2016-06-25 LAB — BASIC METABOLIC PANEL
ANION GAP: 7 (ref 5–15)
BUN: 15 mg/dL (ref 6–20)
CALCIUM: 9.5 mg/dL (ref 8.9–10.3)
CO2: 30 mmol/L (ref 22–32)
CREATININE: 0.94 mg/dL (ref 0.44–1.00)
Chloride: 100 mmol/L — ABNORMAL LOW (ref 101–111)
GFR calc Af Amer: >60 mL/min (ref >60)
GLUCOSE: 121 mg/dL — AB (ref 65–99)
Potassium: 3 mmol/L — ABNORMAL LOW (ref 3.5–5.1)
Sodium: 137 mmol/L (ref 135–145)

## 2016-06-25 LAB — CBC
HCT: 31.4 % — ABNORMAL LOW (ref 36.0–46.0)
HEMOGLOBIN: 10.7 g/dL — AB (ref 12.0–15.0)
MCH: 28.7 pg (ref 26.0–34.0)
MCHC: 34.1 g/dL (ref 30.0–36.0)
MCV: 84.2 fL (ref 78.0–100.0)
PLATELETS: 346 10*3/uL (ref 150–400)
RBC: 3.73 MIL/uL — ABNORMAL LOW (ref 3.87–5.11)
RDW: 12.7 % (ref 11.5–15.5)
WBC: 8.2 10*3/uL (ref 4.0–10.5)

## 2016-06-25 LAB — I-STAT TROPONIN, ED
TROPONIN I, POC: 0 ng/mL (ref 0.00–0.08)
Troponin i, poc: 0 ng/mL (ref 0.00–0.08)

## 2016-06-25 IMAGING — DX DG CHEST 2V
2 series · 2 of 2 positions shown · non-contrast
Comparison: Radiograph [DATE]

CLINICAL DATA: Chest pain.

EXAM:
CHEST  2 VIEW

[chest pa]
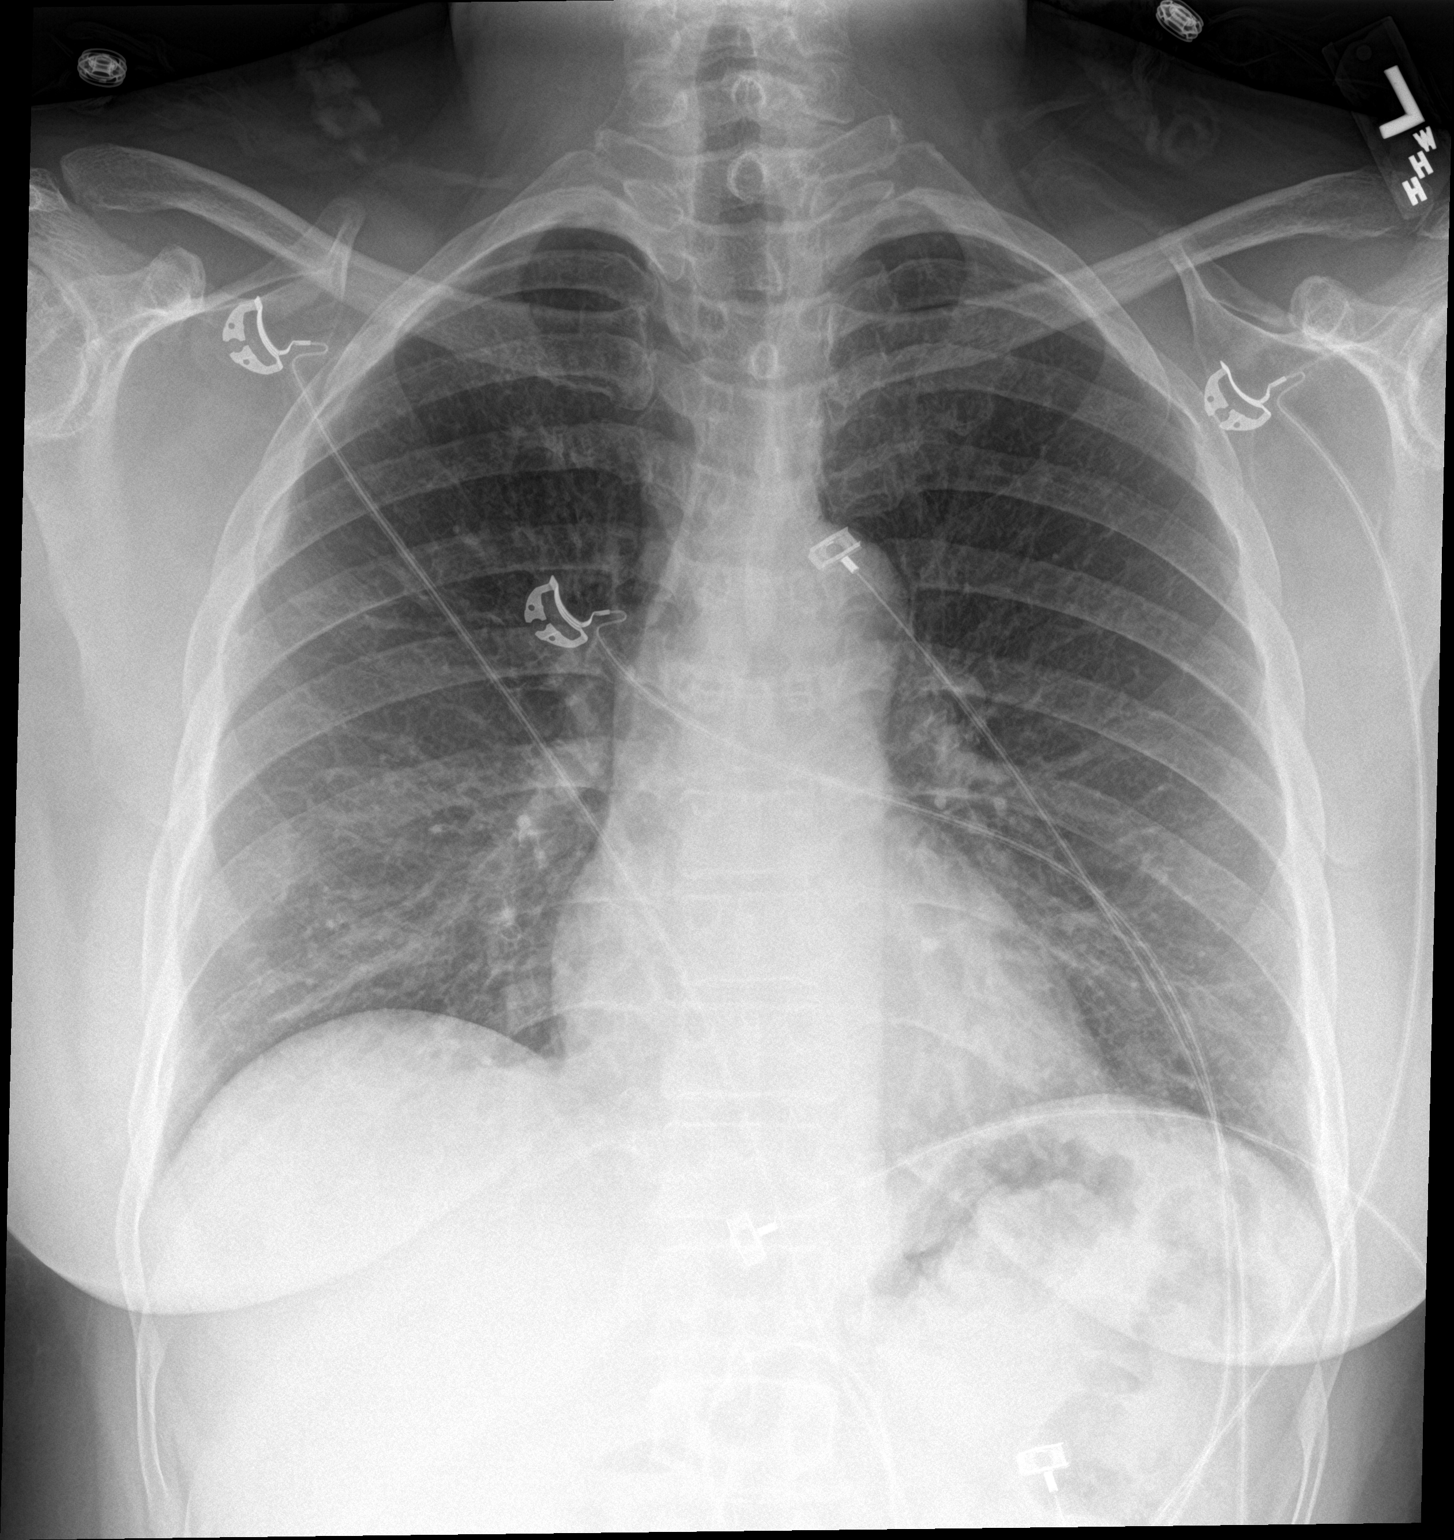

[chest lat]
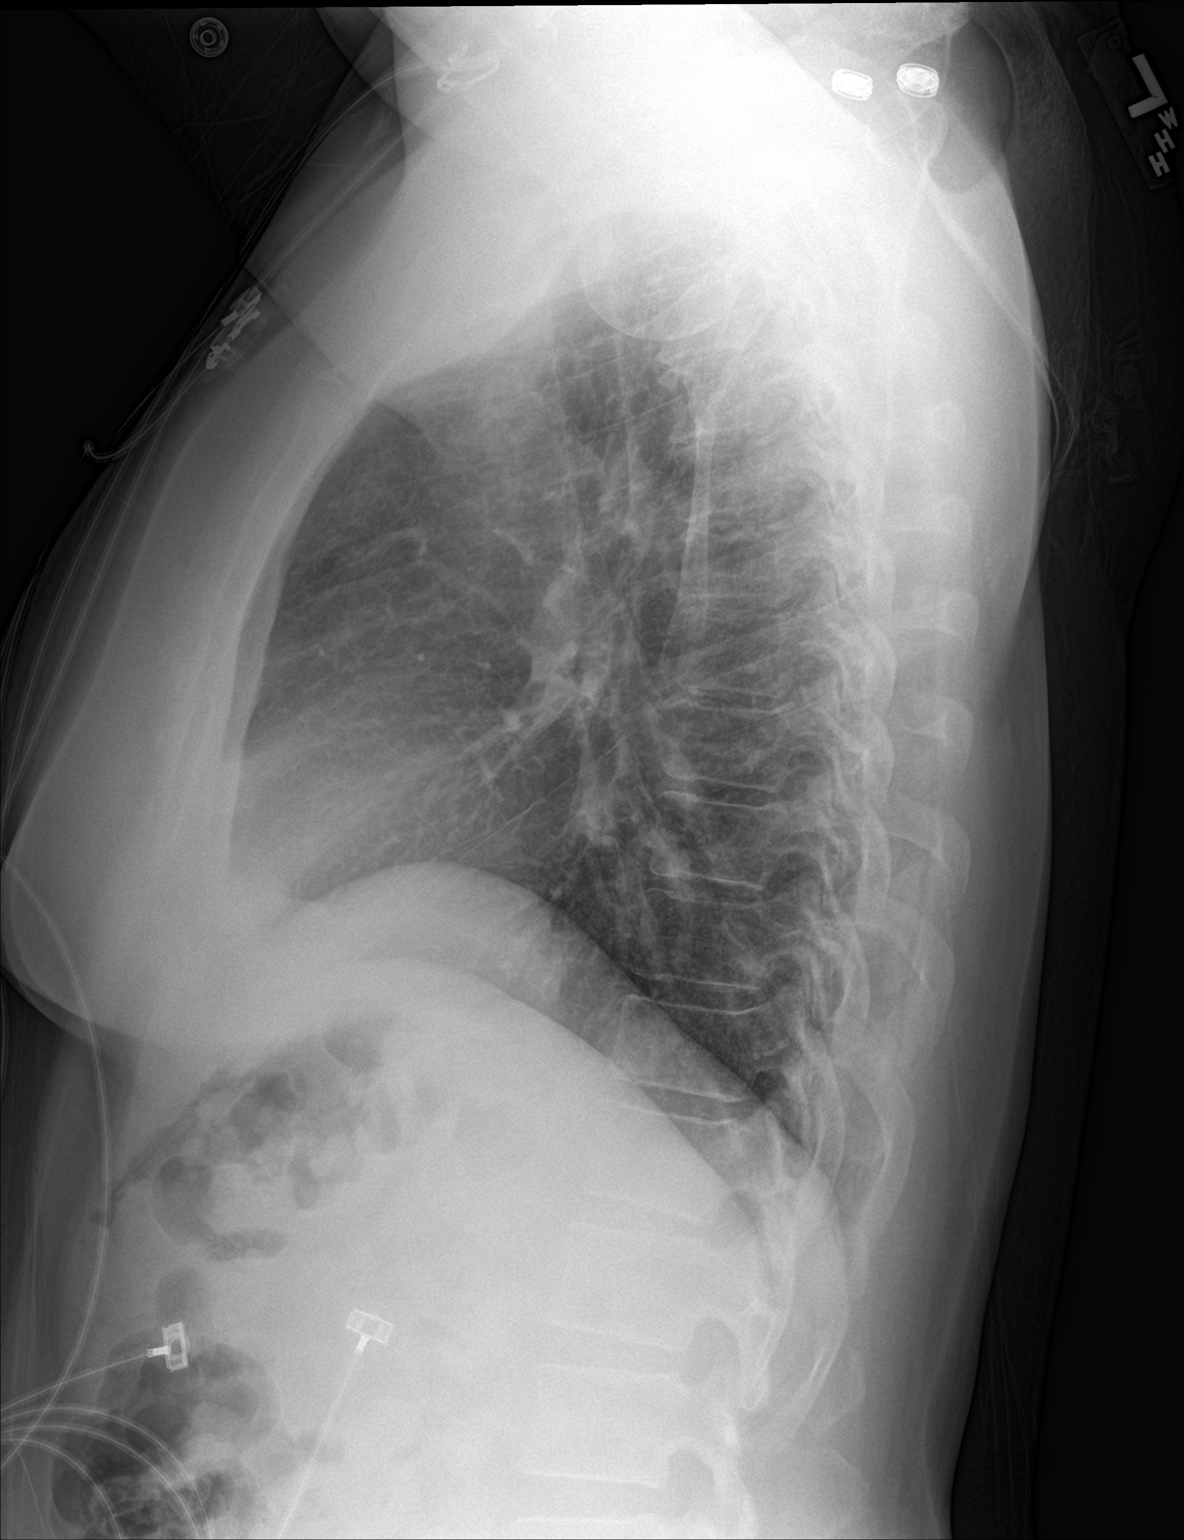

[2 of 2 positions shown; findings below may reference images not displayed]

FINDINGS: The cardiomediastinal contours are normal. Streaky atelectasis at
the lung bases. Pulmonary vasculature is normal. No consolidation,
pleural effusion, or pneumothorax. No acute osseous abnormalities
are seen.
IMPRESSION: Streaky bibasilar atelectasis.

## 2016-06-25 NOTE — ED Notes (Signed)
Pt states chest pain that goes to her back. Pt says maybe her acid reflux or gas, but just wants to be checked.

## 2016-06-25 NOTE — Telephone Encounter (Signed)
No more narcotics.  Not seen since November

## 2016-06-25 NOTE — Telephone Encounter (Signed)
Hydrocodone-Acetaminophen  7.5/325mg  Qty 35 Tablets °

## 2016-06-25 NOTE — ED Provider Notes (Signed)
[facility] DEPT Provider Note   CSN: [id number] Arrival date & time: 06/25/16  0509     History   Chief Complaint Chief Complaint  Patient presents with  . Chest Pain    HPI AMARISS DETAMORE is a 56 y.o. female.  HPI Patient presents emergency department complaining of left-sided chest discomfort and pain that radiates around towards her left back that awoke her from sleep.  She describes the pain as achy and had some associated nausea.  No vomiting.  Denies diarrhea.  No fevers or chills.  Denies cough.  Symptoms are mild in severity.  She had a brother who passed away of a major heart attack at age 17 and therefore she came to the ER for evaluation make sure that she does not have a heart attack.  No other complaints.  She's never had discomfort or pain like this before.   Past Medical History:  Diagnosis Date  . Anemia   . Arthritis   . Blood dyscrasia    sickle cell trait  . Hypercholesterolemia   . Hypertension    does not take meds  . Sickle cell trait [facility])     Patient Active Problem List   Diagnosis Date Noted  . Hyperlipidemia 06/15/2015  . Pain of left hand 05/19/2015  . Hand pain, right 05/19/2015  . Hematuria 03/03/2015  . Esophageal reflux 03/03/2015  . Essential hypertension, benign 03/03/2015  . Bronchitis due to tobacco use ([place]) 10/17/2010    Past Surgical History:  Procedure Laterality Date  . APPENDECTOMY    . CARPAL TUNNEL RELEASE  03/23/2011   Procedure: CARPAL TUNNEL RELEASE;  Surgeon: Sanjuana Kava;  Location: [facility];  Service: Orthopedics;  Laterality: Left;  . CARPAL TUNNEL RELEASE  05/10/2011   Procedure: CARPAL TUNNEL RELEASE;  Surgeon: Sanjuana Kava, MD;  Location: [facility];  Service: Orthopedics;  Laterality: Right;  . CESAREAN SECTION     x 2  . INCISION AND DRAINAGE PERIRECTAL ABSCESS  12/21/09  . PROCTOSCOPY  10/17/2010   Procedure: PROCTOSCOPY;  Surgeon: Scherry Ran;  Location: [facility];  Service: General;  Laterality: N/A;   Rigid Proctoscopy/Possible Fistula in Ano  Procedure ended at 1003  . THERAPEUTIC ABORTION     x2    OB History    Gravida Para Term Preterm AB Living   4 2 2   2 2    SAB TAB Ectopic Multiple Live Births                   Home Medications    Prior to Admission medications   Medication Sig Start Date End Date Taking? Authorizing Provider  atorvastatin (LIPITOR) 20 MG tablet Take 1 tablet (20 mg total) by mouth daily. 03/14/15   Soyla Dryer, PA-C  azithromycin (ZITHROMAX Z-PAK) 250 MG tablet Take 1 tablet (250 mg total) by mouth daily. Take 2 tabs (500mg ) on day 1, followed by 1 tab on days 2 through 5. 04/03/16   Kinnie Feil, PA-C  cyclobenzaprine (FLEXERIL) 10 MG tablet Take 1 tablet (10 mg total) by mouth 3 (three) times daily. 06/07/16   Lily Kocher, PA-C  diclofenac (VOLTAREN) 75 MG EC tablet Take 1 tablet (75 mg total) by mouth 2 (two) times daily. 06/07/16   Lily Kocher, PA-C  doxycycline (VIBRAMYCIN) 100 MG capsule Take 1 capsule (100 mg total) by mouth 2 (two) times daily. 04/18/16   Nat Christen, MD  fluticasone [facility]) 50 MCG/ACT nasal spray Place 2 sprays into both  nostrils daily. 04/03/16   Kinnie Feil, PA-C  fluticasone (FLONASE) 50 MCG/ACT nasal spray Place 2 sprays into both nostrils daily. 04/03/16   Kinnie Feil, PA-C  HYDROcodone-acetaminophen (NORCO) 7.5-325 MG tablet Take 1 tablet by mouth every 6 (six) hours as needed for moderate pain (Must last 30 days.Do not drive or operate machinery while taking this medicine.). 05/28/16   Sanjuana Kava, MD  lisinopril-hydrochlorothiazide (PRINZIDE,ZESTORETIC) 20-12.5 MG tablet Take 1 tablet by mouth daily.    Historical Provider, MD  methocarbamol (ROBAXIN) 500 MG tablet Take 1 tablet (500 mg total) by mouth 4 (four) times daily. Patient taking differently: Take 500 mg by mouth daily as needed for muscle spasms.  12/28/15   Dorie Rank, MD  Multiple Vitamin (MULTIVITAMIN WITH MINERALS) TABS tablet Take 1 tablet by  mouth daily.    Historical Provider, MD  ondansetron (ZOFRAN ODT) 4 MG disintegrating tablet 4mg  ODT q4 hours prn nausea/vomit 03/19/16   Milton Ferguson, MD  ranitidine (ZANTAC) 150 MG tablet Take 150 mg by mouth 2 (two) times daily.     Historical Provider, MD  sulfamethoxazole-trimethoprim (BACTRIM DS,SEPTRA DS) 800-160 MG tablet Take 1 tablet by mouth 2 (two) times daily. 03/09/16   Historical Provider, MD    Family History Family History  Problem Relation Age of Onset  . Kidney failure Daughter   . Sickle cell anemia Daughter   . Sickle cell anemia Son   . Hypertension Mother   . Hypertension Other   . Anesthesia problems Neg Hx   . Hypotension Neg Hx   . Malignant hyperthermia Neg Hx   . Pseudochol deficiency Neg Hx     Social History Social History  Substance Use Topics  . Smoking status: Former Smoker    Packs/day: 0.50    Years: 30.00    Types: Cigarettes    Quit date: 04/21/2015  . Smokeless tobacco: Never Used  . Alcohol use No     Allergies   Penicillins; Penicillins cross reactors; and Tape   Review of Systems Review of Systems  All other systems reviewed and are negative.    Physical Exam Updated Vital Signs BP 132/74   Pulse 72   Temp 98.4 F (36.9 C) (Oral)   Resp 17   Ht 5\' 9"  (1.753 m)   Wt 181 lb (82.1 kg)   LMP 08/12/2010   SpO2 100%   BMI 26.73 kg/m   Physical Exam  Constitutional: She is oriented to person, place, and time. She appears well-developed and well-nourished. No distress.  HENT:  Head: Normocephalic and atraumatic.  Eyes: EOM are normal.  Neck: Normal range of motion.  Cardiovascular: Normal rate, regular rhythm and normal heart sounds.   Pulmonary/Chest: Effort normal and breath sounds normal.  Abdominal: Soft. She exhibits no distension. There is no tenderness.  Musculoskeletal: Normal range of motion.  Neurological: She is alert and oriented to person, place, and time.  Skin: Skin is warm and dry.  Psychiatric: She  has a normal mood and affect. Judgment normal.  Nursing note and vitals reviewed.    ED Treatments / Results  Labs (all labs ordered are listed, but only abnormal results are displayed) Labs Reviewed  BASIC METABOLIC PANEL - Abnormal; Notable for the following:       Result Value   Potassium 3.0 (*)    Chloride 100 (*)    Glucose, Bld 121 (*)    All other components within normal limits  CBC - Abnormal; Notable for the following:  RBC 3.73 (*)    Hemoglobin 10.7 (*)    HCT 31.4 (*)    All other components within normal limits  I-STAT TROPOININ, ED    EKG  EKG Interpretation  Date/Time:  Monday June 25 2016 05:18:38 EDT Ventricular Rate:  77 PR Interval:    QRS Duration: 92 QT Interval:  376 QTC Calculation: 426 R Axis:   71 Text Interpretation:  Sinus rhythm Anteroseptal infarct, age indeterminate No significant change was found Confirmed by Venora Maples  MD, Lennette Bihari ([id number]) on 06/25/2016 6:01:33 AM       Radiology Dg Chest 2 View  Result Date: 06/25/2016 CLINICAL DATA:  Chest pain. EXAM: CHEST  2 VIEW COMPARISON:  Radiograph 12/31/2014 FINDINGS: The cardiomediastinal contours are normal. Streaky atelectasis at the lung bases. Pulmonary vasculature is normal. No consolidation, pleural effusion, or pneumothorax. No acute osseous abnormalities are seen. IMPRESSION: Streaky bibasilar atelectasis. Electronically Signed   By: Jeb Levering M.D.   On: 06/25/2016 06:41    Procedures Procedures (including critical care time)  Medications Ordered in ED Medications - No data to display   Initial Impression / Assessment and Plan / ED Course  I have reviewed the triage vital signs and the nursing notes.  Pertinent labs & imaging results that were available during my care of the patient were reviewed by me and considered in my medical decision making (see chart for details).      Patient is overall well-appearing.  Nonspecific chest pain.  Given her age and risk factors of  hypertension hyperlipidemia she will undergo 2 sets of cardiac enzymes here in the emergency department.  Her EKG is without ischemic changes.  Her initial troponin is negative.  I suspect the patient can safely be discharged from the emergency department.  She will receive a second troponin.  Outpatient primary care follow-up.   Final Clinical Impressions(s) / ED Diagnoses   Final diagnoses:  None    New Prescriptions New Prescriptions   No medications on file     Jola Schmidt, MD 06/25/16 [phone]

## 2016-06-25 NOTE — ED Triage Notes (Signed)
Pt c/o chest pain to left side of chest that radiates around to her back that woke her up from sleep; pt describes the pain as achy and had some nausea to begin with; pt states she took ibuprofen with no relief

## 2016-06-25 NOTE — ED Notes (Signed)
Pt made aware to return if symptoms worsen or if any life threatening symptoms occur.   

## 2016-06-27 ENCOUNTER — Encounter: Payer: Self-pay | Admitting: *Deleted

## 2016-06-28 ENCOUNTER — Emergency Department (HOSPITAL_COMMUNITY): Payer: BLUE CROSS/BLUE SHIELD

## 2016-06-28 ENCOUNTER — Emergency Department (HOSPITAL_COMMUNITY)
Admission: EM | Admit: 2016-06-28 | Discharge: 2016-06-28 | Disposition: A | Discharge status: Home / Self Care | Payer: BLUE CROSS/BLUE SHIELD | Attending: Emergency Medicine | Admitting: Emergency Medicine

## 2016-06-28 ENCOUNTER — Encounter (HOSPITAL_COMMUNITY): Payer: Self-pay

## 2016-06-28 DIAGNOSIS — S93601A Unspecified sprain of right foot, initial encounter: Secondary | ICD-10-CM | POA: Diagnosis not present

## 2016-06-28 DIAGNOSIS — Z79899 Other long term (current) drug therapy: Secondary | ICD-10-CM | POA: Diagnosis not present

## 2016-06-28 DIAGNOSIS — I10 Essential (primary) hypertension: Secondary | ICD-10-CM | POA: Diagnosis not present

## 2016-06-28 DIAGNOSIS — W51XXXA Accidental striking against or bumped into by another person, initial encounter: Secondary | ICD-10-CM | POA: Diagnosis not present

## 2016-06-28 DIAGNOSIS — Y929 Unspecified place or not applicable: Secondary | ICD-10-CM | POA: Insufficient documentation

## 2016-06-28 DIAGNOSIS — Z87891 Personal history of nicotine dependence: Secondary | ICD-10-CM | POA: Diagnosis not present

## 2016-06-28 DIAGNOSIS — Y999 Unspecified external cause status: Secondary | ICD-10-CM | POA: Insufficient documentation

## 2016-06-28 DIAGNOSIS — M79671 Pain in right foot: Secondary | ICD-10-CM | POA: Diagnosis not present

## 2016-06-28 DIAGNOSIS — S99921A Unspecified injury of right foot, initial encounter: Secondary | ICD-10-CM | POA: Diagnosis not present

## 2016-06-28 DIAGNOSIS — Y939 Activity, unspecified: Secondary | ICD-10-CM | POA: Diagnosis not present

## 2016-06-28 MED ORDER — IBUPROFEN 800 MG PO TABS: 800.0000 mg | ORAL_TABLET | Freq: Three times a day (TID) | ORAL | 0 refills | Status: DC

## 2016-06-28 MED ORDER — IBUPROFEN 800 MG PO TABS
800.0000 mg | ORAL_TABLET | Freq: Once | ORAL | Status: AC
  Administered 2016-06-28: 800 mg via ORAL
  Filled 2016-06-28: qty 1

## 2016-06-28 NOTE — Discharge Instructions (Signed)
Wear the ace bandage and use crutches to avoid weight bearing.  Use ice and elevation as much as possible for the next several days to help reduce the pain.  Take the medication prescribed.
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
## 2016-07-04 ENCOUNTER — Ambulatory Visit (INDEPENDENT_AMBULATORY_CARE_PROVIDER_SITE_OTHER): Payer: BLUE CROSS/BLUE SHIELD | Admitting: Orthopaedic Surgery

## 2016-07-04 ENCOUNTER — Encounter: Payer: Self-pay | Admitting: Orthopaedic Surgery

## 2016-07-04 ENCOUNTER — Ambulatory Visit: Payer: BLUE CROSS/BLUE SHIELD | Admitting: Women's Health

## 2016-07-04 VITALS — BP 107/66 | HR 82 | Temp 97.3°F | Ht 66.5 in | Wt 181.0 lb

## 2016-07-04 DIAGNOSIS — M79642 Pain in left hand: Secondary | ICD-10-CM | POA: Diagnosis not present

## 2016-07-04 DIAGNOSIS — I10 Essential (primary) hypertension: Secondary | ICD-10-CM | POA: Diagnosis not present

## 2016-07-04 DIAGNOSIS — M79641 Pain in right hand: Secondary | ICD-10-CM

## 2016-07-04 MED ORDER — IBUPROFEN 800 MG PO TABS: 800.0000 mg | ORAL_TABLET | Freq: Three times a day (TID) | ORAL | 5 refills | Status: DC

## 2016-07-04 NOTE — Progress Notes (Signed)
Patient GU:RKYHCW Amber Drake, female DOB:1961/03/19, 56 y.o. CBJ:[id number]  Chief Complaint  Patient presents with  . Follow-up    bilateral hand pain    HPI  Amber Drake is a 56 y.o. female who has bilateral hand pain, worse in the mornings.  She has no numbness, she is post bilateral carpal tunnel releases in the past.  She has no trauma, no redness.  She has some stiffness and swelling but it gets better as the day goes on. She has Voltaren Gel which helps.  She has taken Motrin and I want her to resume this, I will give new Rx. HPI  Body mass index is 28.78 kg/m.  ROS  Review of Systems  HENT: Negative for congestion.   Respiratory: Negative for cough and shortness of breath.   Cardiovascular: Negative for chest pain and leg swelling.  Endocrine: Positive for cold intolerance.  Musculoskeletal: Positive for arthralgias.  Allergic/Immunologic: Positive for environmental allergies.    Past Medical History:  Diagnosis Date  . Anemia   . Arthritis   . Blood dyscrasia    sickle cell trait  . Carbuncle and furuncle   . Hypercholesterolemia   . Hypertension    does not take meds  . Sickle cell trait [facility])     Past Surgical History:  Procedure Laterality Date  . APPENDECTOMY    . CARPAL TUNNEL RELEASE  03/23/2011   Procedure: CARPAL TUNNEL RELEASE;  Surgeon: Sanjuana Kava;  Location: [facility];  Service: Orthopedics;  Laterality: Left;  . CARPAL TUNNEL RELEASE  05/10/2011   Procedure: CARPAL TUNNEL RELEASE;  Surgeon: Sanjuana Kava, MD;  Location: [facility];  Service: Orthopedics;  Laterality: Right;  . CESAREAN SECTION     x 2  . INCISION AND DRAINAGE PERIRECTAL ABSCESS  12/21/09  . PROCTOSCOPY  10/17/2010   Procedure: PROCTOSCOPY;  Surgeon: Scherry Ran;  Location: [facility];  Service: General;  Laterality: N/A;  Rigid Proctoscopy/Possible Fistula in Ano  Procedure ended at 1003  . THERAPEUTIC ABORTION     x2    Family History  Problem Relation Age of Onset  .  Kidney failure Daughter   . Sickle cell anemia Daughter   . Sickle cell anemia Son   . Hypertension Mother   . Hypertension Other   . Anesthesia problems Neg Hx   . Hypotension Neg Hx   . Malignant hyperthermia Neg Hx   . Pseudochol deficiency Neg Hx     Social History Social History  Substance Use Topics  . Smoking status: Former Smoker    Packs/day: 0.50    Years: 30.00    Types: Cigarettes    Quit date: 04/21/2015  . Smokeless tobacco: Never Used  . Alcohol use No      Current Outpatient Prescriptions  Medication Sig Dispense Refill  . atorvastatin (LIPITOR) 20 MG tablet Take 1 tablet (20 mg total) by mouth daily. 90 tablet 2  . cyclobenzaprine (FLEXERIL) 10 MG tablet Take 1 tablet (10 mg total) by mouth 3 (three) times daily. 20 tablet 0  . diclofenac (VOLTAREN) 75 MG EC tablet Take 1 tablet (75 mg total) by mouth 2 (two) times daily. 14 tablet 0  . ibuprofen (ADVIL,MOTRIN) 800 MG tablet Take 1 tablet (800 mg total) by mouth 3 (three) times daily. 100 tablet 5  . lisinopril-hydrochlorothiazide (PRINZIDE,ZESTORETIC) 20-12.5 MG tablet Take 1 tablet by mouth daily.    . methocarbamol (ROBAXIN) 500 MG tablet Take 1 tablet (500 mg total) by mouth 4 (four)  times daily. (Patient taking differently: Take 500 mg by mouth daily as needed for muscle spasms. ) 28 tablet 1  . Multiple Vitamin (MULTIVITAMIN WITH MINERALS) TABS tablet Take 1 tablet by mouth daily.    . ranitidine (ZANTAC) 150 MG tablet Take 150 mg by mouth 2 (two) times daily.     Marland Kitchen sulfamethoxazole-trimethoprim (BACTRIM DS,SEPTRA DS) 800-160 MG tablet Take 1 tablet by mouth 2 (two) times daily.  0   No current facility-administered medications for this visit.      Physical Exam  Blood pressure 107/66, pulse 82, temperature 97.3 F (36.3 C), height 5' 6.5" (1.689 m), weight 181 lb (82.1 kg), last menstrual period 08/12/2010.  Constitutional: overall normal hygiene, normal nutrition, well developed, normal grooming,  normal body habitus. Assistive device:none  Musculoskeletal: gait and station Limp none, muscle tone and strength are normal, no tremors or atrophy is present.  .  Neurological: coordination overall normal.  Deep tendon reflex/nerve stretch intact.  Sensation normal.  Cranial nerves II-XII intact.   Skin:   Normal overall no scars, lesions, ulcers or rashes. No psoriasis.  Psychiatric: Alert and oriented x 3.  Recent memory intact, remote memory unclear.  Normal mood and affect. Well groomed.  Good eye contact.  Cardiovascular: overall no swelling, no varicosities, no edema bilaterally, normal temperatures of the legs and arms, no clubbing, cyanosis and good capillary refill.  Lymphatic: palpation is normal.  Both hands are tender more at the DIP joints, no redness, no numbness, good grips.  NV intact.  She has no swelling of the wrists.  The patient has been educated about the nature of the problem(s) and counseled on treatment options.  The patient appeared to understand what I have discussed and is in agreement with it.  Encounter Diagnoses  Name Primary?  . Pain of left hand Yes  . Hand pain, right   . Essential hypertension, benign     PLAN Call if any problems.  Precautions discussed.  Continue current medications.   Return to clinic 3 months   Electronically Signed Sanjuana Kava, MD 4/4/20188:27 AM

## 2016-07-12 ENCOUNTER — Ambulatory Visit: Payer: BLUE CROSS/BLUE SHIELD | Admitting: Adult Health

## 2016-07-25 ENCOUNTER — Ambulatory Visit: Payer: BLUE CROSS/BLUE SHIELD | Admitting: Adult Health

## 2016-07-31 ENCOUNTER — Encounter: Payer: Self-pay | Admitting: Adult Health

## 2016-07-31 ENCOUNTER — Encounter (INDEPENDENT_AMBULATORY_CARE_PROVIDER_SITE_OTHER): Payer: Self-pay

## 2016-07-31 ENCOUNTER — Ambulatory Visit (INDEPENDENT_AMBULATORY_CARE_PROVIDER_SITE_OTHER): Payer: BLUE CROSS/BLUE SHIELD | Admitting: Adult Health

## 2016-07-31 VITALS — BP 118/72 | HR 78 | Ht 67.0 in | Wt 179.0 lb

## 2016-07-31 DIAGNOSIS — L0293 Carbuncle, unspecified: Secondary | ICD-10-CM | POA: Diagnosis not present

## 2016-07-31 DIAGNOSIS — L732 Hidradenitis suppurativa: Secondary | ICD-10-CM

## 2016-07-31 MED ORDER — SILVER SULFADIAZINE 1 % EX CREA: 1.0000 "application " | TOPICAL_CREAM | Freq: Two times a day (BID) | CUTANEOUS | 0 refills | Status: DC

## 2016-07-31 MED ORDER — SULFAMETHOXAZOLE-TRIMETHOPRIM 800-160 MG PO TABS: 1.0000 | ORAL_TABLET | Freq: Two times a day (BID) | ORAL | 1 refills | Status: DC

## 2016-07-31 NOTE — Progress Notes (Signed)
Subjective:     Patient ID: Amber Drake, female   DOB: 1960/06/06, 56 y.o.   MRN: [id number]  HPI Amber Drake is a 56 year old black female in complaining of recurrent boils in vaginal area, has had surgical excisions in past, by Dr Romona Curls. PCP is Dr Legrand Rams.   Review of Systems Boils in vaginal area Reviewed past medical,surgical, social and family history. Reviewed medications and allergies.     Objective:   Physical Exam BP 118/72 (BP Location: Right Arm, Patient Position: Sitting, Cuff Size: Normal)   Pulse 78   Ht 5\' 7"  (1.702 m)   Wt 179 lb (81.2 kg)   LMP 08/12/2010   BMI 28.04 kg/m    Skin warm and dry.Pelvic: external genitalia is normal in appearance, has boil left labia at base, about 4 cm,and has scarring from prior boils and surgical excisions, has swollen lymph node in left groin , vagina: pale pink, with loss of moisture and rugae,urethra has no lesions or masses noted, cervix:smooth, uterus: normal size, shape and contour, non tender, no masses felt, adnexa: no masses or tenderness noted. Bladder is non tender and no masses felt.  Showed her pictures of hidradenitis and explained is chronic.  Assessment:     1. Recurrent boils   2. Hidradenitis       Plan:     Rx septra ds 1 bid x 14 days #28 with 1 refills Rx silvadene cream bid Try boil eze Do not shave, can clip Use dial antibacterial soap Follow up in 1 week Review handout on boils

## 2016-07-31 NOTE — Patient Instructions (Signed)
Skin Abscess A skin abscess is an infected area on or under your skin that contains a collection of pus and other material. An abscess may also be called a furuncle, carbuncle, or boil. An abscess can occur in or on almost any part of your body. Some abscesses break open (rupture) on their own. Most continue to get worse unless they are treated. The infection can spread deeper into the body and eventually into your blood, which can make you feel ill. Treatment usually involves draining the abscess. What are the causes? An abscess occurs when germs, often bacteria, pass through your skin and cause an infection. This may be caused by:  A scrape or cut on your skin.  A puncture wound through your skin, including a needle injection.  Blocked oil or sweat glands.  Blocked and infected hair follicles.  A cyst that forms beneath your skin (sebaceous cyst) and becomes infected. What increases the risk? This condition is more likely to develop in people who:  Have a weak body defense system (immune system).  Have diabetes.  Have dry and irritated skin.  Get frequent injections or use illegal IV drugs.  Have a foreign body in a wound, such as a splinter.  Have problems with their lymph system or veins. What are the signs or symptoms? An abscess may start as a painful, firm bump under the skin. Over time, the abscess may get larger or become softer. Pus may appear at the top of the abscess, causing pressure and pain. It may eventually break through the skin and drain. Other symptoms include:  Redness.  Warmth.  Swelling.  Tenderness.  A sore on the skin. How is this diagnosed? This condition is diagnosed based on your medical history and a physical exam. A sample of pus may be taken from the abscess to find out what is causing the infection and what antibiotics can be used to treat it. You also may have:  Blood tests to look for signs of infection or spread of an infection to your  blood.  Imaging studies such as ultrasound, CT scan, or MRI if the abscess is deep. How is this treated? Small abscesses that drain on their own may not need treatment. Treatment for an abscess that does not rupture on its own may include:  Warm compresses applied to the area several times per day.  Incision and drainage. Your health care provider will make an incision to open the abscess and will remove pus and any foreign body or dead tissue. The incision area may be packed with gauze to keep it open for a few days while it heals.  Antibiotic medicines to treat infection. For a severe abscess, you may first get antibiotics through an IV and then change to oral antibiotics. Follow these instructions at home: Abscess Care   If you have an abscess that has not drained, place a warm, clean, wet washcloth over the abscess several times a day. Do this as told by your health care provider.  Follow instructions from your health care provider about how to take care of your abscess. Make sure you:  Cover the abscess with a bandage (dressing).  Change your dressing or gauze as told by your health care provider.  Wash your hands with soap and water before you change the dressing or gauze. If soap and water are not available, use hand sanitizer.  Check your abscess every day for signs of a worsening infection. Check for:  More redness, swelling, or   pain.  More fluid or blood.  Warmth.  More pus or a bad smell. Medicines   Take over-the-counter and prescription medicines only as told by your health care provider.  If you were prescribed an antibiotic medicine, take it as told by your health care provider. Do not stop taking the antibiotic even if you start to feel better. General instructions   To avoid spreading the infection:  Do not share personal care items, towels, or hot tubs with others.  Avoid making skin contact with other people.  Keep all follow-up visits as told by your  health care provider. This is important. Contact a health care provider if:  You have more redness, swelling, or pain around your abscess.  You have more fluid or blood coming from your abscess.  Your abscess feels warm to the touch.  You have more pus or a bad smell coming from your abscess.  You have a fever.  You have muscle aches.  You have chills or a general ill feeling. Get help right away if:  You have severe pain.  You see red streaks on your skin spreading away from the abscess. This information is not intended to replace advice given to you by your health care provider. Make sure you discuss any questions you have with your health care provider. Document Released: 12/27/2004 Document Revised: 11/13/2015 Document Reviewed: 01/26/2015 Elsevier Interactive Patient Education  2017 [employer]  

## 2016-08-08 ENCOUNTER — Ambulatory Visit: Payer: BLUE CROSS/BLUE SHIELD | Admitting: Adult Health

## 2016-08-08 ENCOUNTER — Encounter: Payer: Self-pay | Admitting: *Deleted

## 2016-08-13 ENCOUNTER — Emergency Department (HOSPITAL_COMMUNITY)
Admission: EM | Admit: 2016-08-13 | Discharge: 2016-08-13 | Disposition: A | Discharge status: Home / Self Care | Payer: BLUE CROSS/BLUE SHIELD | Attending: Emergency Medicine | Admitting: Emergency Medicine

## 2016-08-13 ENCOUNTER — Encounter (HOSPITAL_COMMUNITY): Payer: Self-pay | Admitting: Emergency Medicine

## 2016-08-13 DIAGNOSIS — Y939 Activity, unspecified: Secondary | ICD-10-CM | POA: Diagnosis not present

## 2016-08-13 DIAGNOSIS — R591 Generalized enlarged lymph nodes: Secondary | ICD-10-CM

## 2016-08-13 DIAGNOSIS — I10 Essential (primary) hypertension: Secondary | ICD-10-CM | POA: Diagnosis not present

## 2016-08-13 DIAGNOSIS — Z87891 Personal history of nicotine dependence: Secondary | ICD-10-CM | POA: Diagnosis not present

## 2016-08-13 DIAGNOSIS — Z79899 Other long term (current) drug therapy: Secondary | ICD-10-CM | POA: Diagnosis not present

## 2016-08-13 DIAGNOSIS — R590 Localized enlarged lymph nodes: Secondary | ICD-10-CM | POA: Diagnosis not present

## 2016-08-13 DIAGNOSIS — Y999 Unspecified external cause status: Secondary | ICD-10-CM | POA: Insufficient documentation

## 2016-08-13 DIAGNOSIS — W57XXXA Bitten or stung by nonvenomous insect and other nonvenomous arthropods, initial encounter: Secondary | ICD-10-CM | POA: Diagnosis not present

## 2016-08-13 DIAGNOSIS — Y929 Unspecified place or not applicable: Secondary | ICD-10-CM | POA: Insufficient documentation

## 2016-08-13 DIAGNOSIS — M542 Cervicalgia: Secondary | ICD-10-CM | POA: Diagnosis not present

## 2016-08-13 NOTE — Discharge Instructions (Signed)
Return if needed

## 2016-08-13 NOTE — ED Triage Notes (Signed)
Pt c/o left sided neck/clavicle pain from knot. Pt states she noticed it 2 days ago.

## 2016-08-13 NOTE — ED Notes (Signed)
Pt states cannot wait on discharge papers and left the facility.

## 2016-08-14 NOTE — ED Provider Notes (Signed)
[facility] DEPT Provider Note   CSN: [id number] Arrival date & time: 08/13/16  2243     History   Chief Complaint Chief Complaint  Patient presents with  . Neck Pain    HPI Amber Drake is a 56 y.o. female.  HPI   Amber Drake is a 56 y.o. female who presents to the Emergency Department requesting evaluation of a "knot" to her left neck that she noticed earlier today. She states that she was also bitten by some type of insect a few days ago and reports pain and itching to her left shoulder.  She denies pain or stiffness of her left neck, fever, swelling, redness or chills.   Nothing makes her symptoms better or worse.     Past Medical History:  Diagnosis Date  . Anemia   . Arthritis   . Blood dyscrasia    sickle cell trait  . Carbuncle and furuncle   . Hypercholesterolemia   . Hypertension    does not take meds  . Sickle cell trait [facility])     Patient Active Problem List   Diagnosis Date Noted  . Hidradenitis 07/31/2016  . Recurrent boils 07/31/2016  . Hyperlipidemia 06/15/2015  . Pain of left hand 05/19/2015  . Hand pain, right 05/19/2015  . Hematuria 03/03/2015  . Esophageal reflux 03/03/2015  . Essential hypertension, benign 03/03/2015  . Bronchitis due to tobacco use ([place]) 10/17/2010    Past Surgical History:  Procedure Laterality Date  . APPENDECTOMY    . CARPAL TUNNEL RELEASE  03/23/2011   Procedure: CARPAL TUNNEL RELEASE;  Surgeon: Sanjuana Kava;  Location: [facility];  Service: Orthopedics;  Laterality: Left;  . CARPAL TUNNEL RELEASE  05/10/2011   Procedure: CARPAL TUNNEL RELEASE;  Surgeon: Sanjuana Kava, MD;  Location: [facility] OR[facility];  Service: Orthopedics;  Laterality: Right;  . CESAREAN SECTION     x 2  . INCISION AND DRAINAGE PERIRECTAL ABSCESS  12/21/09  . PROCTOSCOPY  10/17/2010   Procedure: PROCTOSCOPY;  Surgeon: Scherry Ran;  Location: [facility];  Service: General;  Laterality: N/A;  Rigid Proctoscopy/Possible Fistula in Ano  Procedure ended  at 1003  . THERAPEUTIC ABORTION     x2    OB History    Gravida Para Term Preterm AB Living   4 2 2   2 2    SAB TAB Ectopic Multiple Live Births                   Home Medications    Prior to Admission medications   Medication Sig Start Date End Date Taking? Authorizing Provider  atorvastatin (LIPITOR) 20 MG tablet Take 1 tablet (20 mg total) by mouth daily. 03/14/15  Yes Soyla Dryer, PA-C  cyclobenzaprine (FLEXERIL) 10 MG tablet Take 1 tablet (10 mg total) by mouth 3 (three) times daily. Patient taking differently: Take 10 mg by mouth 3 (three) times daily as needed for muscle spasms.  06/07/16  Yes Lily Kocher, PA-C  diclofenac (VOLTAREN) 75 MG EC tablet Take 1 tablet (75 mg total) by mouth 2 (two) times daily. 06/07/16  Yes Lily Kocher, PA-C  ferrous sulfate 325 (65 FE) MG tablet Take 325 mg by mouth daily with breakfast.   Yes [provider]  ibuprofen (ADVIL,MOTRIN) 800 MG tablet Take 1 tablet (800 mg total) by mouth 3 (three) times daily. Patient taking differently: Take 800 mg by mouth every 8 (eight) hours as needed for mild pain or moderate pain.  07/04/16  Yes Keeling,  Patrick Jupiter, MD  lisinopril-hydrochlorothiazide (PRINZIDE,ZESTORETIC) 20-12.5 MG tablet Take 1 tablet by mouth daily.   Yes [provider]  Multiple Vitamin (MULTIVITAMIN WITH MINERALS) TABS tablet Take 1 tablet by mouth daily.   Yes [provider]  silver sulfADIAZINE (SILVADENE) 1 % cream Apply 1 application topically 2 (two) times daily. 07/31/16  Yes Estill Dooms, NP  sulfamethoxazole-trimethoprim (BACTRIM DS,SEPTRA DS) 800-160 MG tablet Take 1 tablet by mouth 2 (two) times daily. 07/31/16  Yes Estill Dooms, NP    Family History Family History  Problem Relation Age of Onset  . Kidney failure Daughter   . Sickle cell anemia Daughter   . Sickle cell trait Daughter   . Sickle cell anemia Son   . Hypertension Mother   . Hyperlipidemia Mother   . Hypertension  Father   . Lupus Father        skin  . Hypertension Other   . Sarcoidosis Sister   . Anesthesia problems Neg Hx   . Hypotension Neg Hx   . Malignant hyperthermia Neg Hx   . Pseudochol deficiency Neg Hx     Social History Social History  Substance Use Topics  . Smoking status: Former Smoker    Packs/day: 0.50    Years: 30.00    Types: Cigarettes    Quit date: 04/21/2015  . Smokeless tobacco: Never Used  . Alcohol use No     Allergies   Penicillins; Penicillins cross reactors; and Tape   Review of Systems Review of Systems  Constitutional: Negative for chills and fever.  Respiratory: Negative for chest tightness and shortness of breath.   Cardiovascular: Negative for chest pain.  Gastrointestinal: Negative for nausea and vomiting.  Musculoskeletal: Negative for arthralgias, joint swelling, neck pain and neck stiffness.  Skin: Negative for color change.       "knot" to left neck.  Insect bite left shoulder  Neurological: Negative for dizziness, weakness, numbness and headaches.  Hematological: Negative for adenopathy.  All other systems reviewed and are negative.    Physical Exam Updated Vital Signs BP 116/71 (BP Location: Right Arm)   Pulse 95   Temp 98.4 F (36.9 C) (Oral)   Resp 20   Ht 5\' 7"  (1.702 m)   Wt 80.9 kg   LMP 08/12/2010   SpO2 100%   BMI 27.94 kg/m   Physical Exam  Constitutional: She is oriented to person, place, and time. She appears well-developed and well-nourished. No distress.  HENT:  Head: Atraumatic.  Mouth/Throat: Oropharynx is clear and moist.  Eyes: EOM are normal. Pupils are equal, round, and reactive to light.  Neck: Normal range of motion, full passive range of motion without pain and phonation normal. Neck supple. No Kernig's sign noted.  Cardiovascular: Normal rate, regular rhythm, normal heart sounds and intact distal pulses.   Pulmonary/Chest: Effort normal and breath sounds normal. No respiratory distress.    Musculoskeletal: Normal range of motion.  Lymphadenopathy:    She has cervical adenopathy.       Left cervical: Superficial cervical adenopathy present.    She has no axillary adenopathy.  Several small, shotty cervical lymph nodes of the left neck.    Neurological: She is alert and oriented to person, place, and time. No sensory deficit.  Skin: Skin is warm. Capillary refill takes less than 2 seconds. No rash noted. No erythema.  Small, raised papule to the anterior left shoulder with mild induration.  No drainage, or erythema.    Psychiatric: She has  a normal mood and affect.  Nursing note and vitals reviewed.    ED Treatments / Results  Labs (all labs ordered are listed, but only abnormal results are displayed) Labs Reviewed - No data to display  EKG  EKG Interpretation None       Radiology No results found.  Procedures Procedures (including critical care time)  Medications Ordered in ED Medications - No data to display   Initial Impression / Assessment and Plan / ED Course  I have reviewed the triage vital signs and the nursing notes.  Pertinent labs & imaging results that were available during my care of the patient were reviewed by me and considered in my medical decision making (see chart for details).     Pt well appearing.  vitals stable.  Likely left cervical lymphadenopathy secondary to insect bite.  Nodes are palpable, but not grossly enlarged.  She is otherwise asymptomatic.  She appears stable for d/c and advised to apply OTC hydrocortisone cream to the insect bite and PCP f/u if not resolving.  Return precautions discussed.   Final Clinical Impressions(s) / ED Diagnoses   Final diagnoses:  Lymphadenopathy    New Prescriptions Discharge Medication List as of 08/13/2016 11:43 PM       Kem Parkinson, PA-C 82/06/01 [id number]    Delora Fuel, MD 37/94/32 [phone]

## 2016-08-17 ENCOUNTER — Ambulatory Visit: Payer: BLUE CROSS/BLUE SHIELD | Admitting: Adult Health

## 2016-08-23 DIAGNOSIS — E785 Hyperlipidemia, unspecified: Secondary | ICD-10-CM | POA: Diagnosis not present

## 2016-08-23 DIAGNOSIS — L089 Local infection of the skin and subcutaneous tissue, unspecified: Secondary | ICD-10-CM | POA: Diagnosis not present

## 2016-08-23 DIAGNOSIS — I10 Essential (primary) hypertension: Secondary | ICD-10-CM | POA: Diagnosis not present

## 2016-08-28 DIAGNOSIS — E119 Type 2 diabetes mellitus without complications: Secondary | ICD-10-CM | POA: Diagnosis not present

## 2016-08-28 DIAGNOSIS — E118 Type 2 diabetes mellitus with unspecified complications: Secondary | ICD-10-CM | POA: Diagnosis not present

## 2016-08-28 DIAGNOSIS — E7800 Pure hypercholesterolemia, unspecified: Secondary | ICD-10-CM | POA: Diagnosis not present

## 2016-08-28 DIAGNOSIS — I10 Essential (primary) hypertension: Secondary | ICD-10-CM | POA: Diagnosis not present

## 2016-09-05 ENCOUNTER — Encounter: Payer: Self-pay | Admitting: *Deleted

## 2016-09-05 ENCOUNTER — Ambulatory Visit: Payer: BLUE CROSS/BLUE SHIELD | Admitting: Adult Health

## 2016-09-12 ENCOUNTER — Encounter: Payer: Self-pay | Admitting: Orthopaedic Surgery

## 2016-09-19 ENCOUNTER — Ambulatory Visit (INDEPENDENT_AMBULATORY_CARE_PROVIDER_SITE_OTHER): Payer: BLUE CROSS/BLUE SHIELD | Admitting: Adult Health

## 2016-09-19 ENCOUNTER — Encounter: Payer: Self-pay | Admitting: Adult Health

## 2016-09-19 VITALS — BP 122/72 | HR 95 | Ht 66.0 in | Wt 175.0 lb

## 2016-09-19 DIAGNOSIS — L0293 Carbuncle, unspecified: Secondary | ICD-10-CM | POA: Diagnosis not present

## 2016-09-19 DIAGNOSIS — L732 Hidradenitis suppurativa: Secondary | ICD-10-CM | POA: Diagnosis not present

## 2016-09-19 MED ORDER — SULFAMETHOXAZOLE-TRIMETHOPRIM 800-160 MG PO TABS: 1.0000 | ORAL_TABLET | Freq: Two times a day (BID) | ORAL | 1 refills | Status: DC

## 2016-09-19 NOTE — Progress Notes (Signed)
Subjective:     Patient ID: Amber Drake, female   DOB: 08-04-1960, 56 y.o.   MRN: [id number]  HPI Amber Drake is a 56 year old black female in for recurrent boil.   Review of Systems Has boil again  Reviewed past medical,surgical, social and family history. Reviewed medications and allergies.     Objective:   Physical Exam BP 122/72 (BP Location: Right Arm, Patient Position: Sitting, Cuff Size: Normal)   Pulse 95   Ht 5\' 6"  (1.676 m)   Wt 175 lb (79.4 kg)   LMP 08/12/2010   BMI 28.25 kg/m    Skin warm and dry.Pelvic: external genitalia is normal in appearance ,except has scarring form surgical excisions of boils and hidradenitis and has new boil at base left labia.Also has had tick bite ion neck and back. The lymph node in left groin that was there in May has resolved.   Assessment:     1. Recurrent boils   2. Hidradenitis       Plan:     Meds ordered this encounter  Medications  . sulfamethoxazole-trimethoprim (BACTRIM DS,SEPTRA DS) 800-160 MG tablet    Sig: Take 1 tablet by mouth 2 (two) times daily.    Dispense:  28 tablet    Refill:  1    Order Specific Question:   Supervising Provider    Answer:   Tania Ade H [[id number]]  Continue silva dene cream and Boil eze Given number for Dr Nevada Crane, to see if Humira option for her Follow up prn

## 2016-10-04 ENCOUNTER — Ambulatory Visit: Payer: BLUE CROSS/BLUE SHIELD | Admitting: Orthopaedic Surgery

## 2016-10-11 DIAGNOSIS — I10 Essential (primary) hypertension: Secondary | ICD-10-CM | POA: Diagnosis not present

## 2016-10-11 DIAGNOSIS — R634 Abnormal weight loss: Secondary | ICD-10-CM | POA: Diagnosis not present

## 2016-10-11 DIAGNOSIS — E669 Obesity, unspecified: Secondary | ICD-10-CM | POA: Diagnosis not present

## 2016-10-11 DIAGNOSIS — E785 Hyperlipidemia, unspecified: Secondary | ICD-10-CM | POA: Diagnosis not present

## 2016-10-11 DIAGNOSIS — Z114 Encounter for screening for human immunodeficiency virus [HIV]: Secondary | ICD-10-CM | POA: Diagnosis not present

## 2016-10-15 ENCOUNTER — Emergency Department (HOSPITAL_COMMUNITY)
Admission: EM | Admit: 2016-10-15 | Discharge: 2016-10-15 | Discharge status: Against Medical Advice | Payer: BLUE CROSS/BLUE SHIELD | Attending: Emergency Medicine | Admitting: Emergency Medicine

## 2016-10-15 ENCOUNTER — Encounter (HOSPITAL_COMMUNITY): Payer: Self-pay

## 2016-10-15 DIAGNOSIS — Y999 Unspecified external cause status: Secondary | ICD-10-CM | POA: Insufficient documentation

## 2016-10-15 DIAGNOSIS — Y929 Unspecified place or not applicable: Secondary | ICD-10-CM | POA: Insufficient documentation

## 2016-10-15 DIAGNOSIS — Y939 Activity, unspecified: Secondary | ICD-10-CM | POA: Diagnosis not present

## 2016-10-15 DIAGNOSIS — S70361A Insect bite (nonvenomous), right thigh, initial encounter: Secondary | ICD-10-CM | POA: Diagnosis not present

## 2016-10-15 DIAGNOSIS — W57XXXA Bitten or stung by nonvenomous insect and other nonvenomous arthropods, initial encounter: Secondary | ICD-10-CM | POA: Insufficient documentation

## 2016-10-15 NOTE — ED Triage Notes (Addendum)
Reports of tick removed from right inner thigh Friday. States area is red and itching.  Denies any other complaints.

## 2016-10-15 NOTE — ED Triage Notes (Signed)
Pt states she is not going to wait and will come back in the morning.

## 2016-10-23 ENCOUNTER — Ambulatory Visit (INDEPENDENT_AMBULATORY_CARE_PROVIDER_SITE_OTHER): Payer: BLUE CROSS/BLUE SHIELD | Admitting: Orthopaedic Surgery

## 2016-10-23 ENCOUNTER — Encounter: Payer: Self-pay | Admitting: Orthopaedic Surgery

## 2016-10-23 VITALS — BP 99/63 | HR 87 | Temp 97.2°F | Ht 68.0 in | Wt 176.0 lb

## 2016-10-23 DIAGNOSIS — I10 Essential (primary) hypertension: Secondary | ICD-10-CM | POA: Diagnosis not present

## 2016-10-23 DIAGNOSIS — M79642 Pain in left hand: Secondary | ICD-10-CM | POA: Diagnosis not present

## 2016-10-23 DIAGNOSIS — M79641 Pain in right hand: Secondary | ICD-10-CM

## 2016-10-23 MED ORDER — PREDNISONE 5 MG (21) PO TBPK: ORAL_TABLET | ORAL | 0 refills | Status: DC

## 2016-10-23 MED ORDER — HYDROCODONE-ACETAMINOPHEN 5-325 MG PO TABS: ORAL_TABLET | ORAL | 0 refills | Status: DC

## 2016-10-23 NOTE — Progress Notes (Signed)
Patient Amber Drake, female DOB:15-Aug-1960, 56 y.o. VWU:[id number]  Chief Complaint  Patient presents with  . Follow-up    bilateral hand pain    HPI  Amber Drake is a 56 y.o. female who has bilateral hand pain.  I last saw her 11-10-15.  She has recurrent hand pain with swelling.  She has no new trauma.  She has no redness or numbness.  She has tried ice, heat, Advil with little help. HPI  Body mass index is 26.76 kg/m.  ROS  Review of Systems  HENT: Negative for congestion.   Respiratory: Negative for cough and shortness of breath.   Cardiovascular: Negative for chest pain and leg swelling.  Endocrine: Positive for cold intolerance.  Musculoskeletal: Positive for arthralgias.  Allergic/Immunologic: Positive for environmental allergies.    Past Medical History:  Diagnosis Date  . Anemia   . Arthritis   . Blood dyscrasia    sickle cell trait  . Carbuncle and furuncle   . Hypercholesterolemia   . Hypertension    does not take meds  . Sickle cell trait [name])     Past Surgical History:  Procedure Laterality Date  . APPENDECTOMY    . CARPAL TUNNEL RELEASE  03/23/2011   Procedure: CARPAL TUNNEL RELEASE;  Surgeon: Sanjuana Kava;  Location: [facility];  Service: Orthopedics;  Laterality: Left;  . CARPAL TUNNEL RELEASE  05/10/2011   Procedure: CARPAL TUNNEL RELEASE;  Surgeon: Sanjuana Kava, MD;  Location: [facility];  Service: Orthopedics;  Laterality: Right;  . CESAREAN SECTION     x 2  . INCISION AND DRAINAGE PERIRECTAL ABSCESS  12/21/09  . PROCTOSCOPY  10/17/2010   Procedure: PROCTOSCOPY;  Surgeon: Scherry Ran;  Location: [facility];  Service: General;  Laterality: N/A;  Rigid Proctoscopy/Possible Fistula in Ano  Procedure ended at 1003  . THERAPEUTIC ABORTION     x2    Family History  Problem Relation Age of Onset  . Kidney failure Daughter   . Sickle cell anemia Daughter   . Sickle cell trait Daughter   . Sickle cell anemia Son   . Hypertension Mother   .  Hyperlipidemia Mother   . Hypertension Father   . Lupus Father        skin  . Hypertension Other   . Sarcoidosis Sister   . Anesthesia problems Neg Hx   . Hypotension Neg Hx   . Malignant hyperthermia Neg Hx   . Pseudochol deficiency Neg Hx     Social History Social History  Substance Use Topics  . Smoking status: Current Every Day Smoker    Packs/day: 0.50    Years: 30.00    Types: Cigarettes  . Smokeless tobacco: Never Used  . Alcohol use No      Current Outpatient Prescriptions  Medication Sig Dispense Refill  . atorvastatin (LIPITOR) 20 MG tablet Take 1 tablet (20 mg total) by mouth daily. 90 tablet 2  . cyclobenzaprine (FLEXERIL) 10 MG tablet Take 1 tablet (10 mg total) by mouth 3 (three) times daily. (Patient taking differently: Take 10 mg by mouth 3 (three) times daily as needed for muscle spasms. ) 20 tablet 0  . diclofenac (VOLTAREN) 75 MG EC tablet Take 1 tablet (75 mg total) by mouth 2 (two) times daily. (Patient not taking: Reported on 09/19/2016) 14 tablet 0  . ferrous sulfate 325 (65 FE) MG tablet Take 325 mg by mouth daily with breakfast.    . HYDROcodone-acetaminophen (NORCO/VICODIN) 5-325 MG tablet One tablet  every four hours as needed for acute pain.  Limit of five days per [place] statue. 30 tablet 0  . ibuprofen (ADVIL,MOTRIN) 800 MG tablet Take 1 tablet (800 mg total) by mouth 3 (three) times daily. (Patient taking differently: Take 800 mg by mouth every 8 (eight) hours as needed for mild pain or moderate pain. ) 100 tablet 5  . lisinopril-hydrochlorothiazide (PRINZIDE,ZESTORETIC) 20-12.5 MG tablet Take 1 tablet by mouth daily.    . Multiple Vitamin (MULTIVITAMIN WITH MINERALS) TABS tablet Take 1 tablet by mouth daily.    . predniSONE (STERAPRED UNI-PAK 21 TAB) 5 MG (21) TBPK tablet Take 6 pills first day; 5 pills second day; 4 pills third day; 3 pills fourth day; 2 pills next day and 1 pill last day. 21 tablet 0  . silver sulfADIAZINE (SILVADENE) 1 % cream  Apply 1 application topically 2 (two) times daily. 50 g 0  . sulfamethoxazole-trimethoprim (BACTRIM DS,SEPTRA DS) 800-160 MG tablet Take 1 tablet by mouth 2 (two) times daily. 28 tablet 1   No current facility-administered medications for this visit.      Physical Exam  Blood pressure 99/63, pulse 87, temperature (!) 97.2 F (36.2 C), height 5\' 8"  (1.727 m), weight 176 lb (79.8 kg), last menstrual period 08/12/2010.  Constitutional: overall normal hygiene, normal nutrition, well developed, normal grooming, normal body habitus. Assistive device:none  Musculoskeletal: gait and station Limp none, muscle tone and strength are normal, no tremors or atrophy is present.  .  Neurological: coordination overall normal.  Deep tendon reflex/nerve stretch intact.  Sensation normal.  Cranial nerves II-XII intact.   Skin:   Normal overall no scars, lesions, ulcers or rashes. No psoriasis.  Psychiatric: Alert and oriented x 3.  Recent memory intact, remote memory unclear.  Normal mood and affect. Well groomed.  Good eye contact.  Cardiovascular: overall no swelling, no varicosities, no edema bilaterally, normal temperatures of the legs and arms, no clubbing, cyanosis and good capillary refill.  Lymphatic: palpation is normal.  Both hands are tender but have no swelling, no redness, no numbness.  NV intact. ROM is full but tender.  The patient has been educated about the nature of the problem(s) and counseled on treatment options.  The patient appeared to understand what I have discussed and is in agreement with it.  Encounter Diagnoses  Name Primary?  . Pain of left hand Yes  . Hand pain, right   . Essential hypertension, benign     PLAN Call if any problems.  Precautions discussed.  Continue current medications.  Begin Prednisone dose pack.  Samples of Aleve given to take after prednisone completed.  Return to clinic 2 weeks   I have reviewed the [employer] web site prior to prescribing narcotic medicine for this patient.  Electronically Signed Sanjuana Kava, MD 7/24/20182:43 PM

## 2016-11-07 ENCOUNTER — Ambulatory Visit (INDEPENDENT_AMBULATORY_CARE_PROVIDER_SITE_OTHER): Payer: BLUE CROSS/BLUE SHIELD | Admitting: Orthopaedic Surgery

## 2016-11-07 ENCOUNTER — Encounter: Payer: Self-pay | Admitting: Orthopaedic Surgery

## 2016-11-07 VITALS — BP 100/70 | HR 87 | Temp 98.0°F | Ht 68.0 in | Wt 176.0 lb

## 2016-11-07 DIAGNOSIS — I10 Essential (primary) hypertension: Secondary | ICD-10-CM

## 2016-11-07 DIAGNOSIS — F17210 Nicotine dependence, cigarettes, uncomplicated: Secondary | ICD-10-CM

## 2016-11-07 DIAGNOSIS — M79642 Pain in left hand: Secondary | ICD-10-CM | POA: Diagnosis not present

## 2016-11-07 MED ORDER — CYCLOBENZAPRINE HCL 10 MG PO TABS: 10.0000 mg | ORAL_TABLET | Freq: Three times a day (TID) | ORAL | 0 refills | Status: DC | PRN

## 2016-11-07 MED ORDER — HYDROCODONE-ACETAMINOPHEN 5-325 MG PO TABS: 1.0000 | ORAL_TABLET | Freq: Four times a day (QID) | ORAL | 0 refills | Status: DC | PRN

## 2016-11-07 MED ORDER — NABUMETONE 750 MG PO TABS: 750.0000 mg | ORAL_TABLET | Freq: Two times a day (BID) | ORAL | 5 refills | Status: DC

## 2016-11-07 NOTE — Progress Notes (Signed)
Patient Amber Drake, female DOB:February 21, 1961, 56 y.o. NGE:[id number]  Chief Complaint  Patient presents with  . Follow-up    Hand pain    HPI  MALASHIA KAMAKA is a 56 y.o. female who has pain of the left arm and upper arm.  The prednisone did not help.  She has no numbness, no new trauma.  She is post carpal tunnel in the past.  I will change to Flexeril, Relafen, stop the ibuprofen, and have given new pain medicine.  I have gone over exercises to do. HPI  Body mass index is 26.76 kg/m.  ROS  Review of Systems  HENT: Negative for congestion.   Respiratory: Negative for cough and shortness of breath.   Cardiovascular: Negative for chest pain and leg swelling.  Endocrine: Positive for cold intolerance.  Musculoskeletal: Positive for arthralgias.  Allergic/Immunologic: Positive for environmental allergies.    Past Medical History:  Diagnosis Date  . Anemia   . Arthritis   . Blood dyscrasia    sickle cell trait  . Carbuncle and furuncle   . Hypercholesterolemia   . Hypertension    does not take meds  . Sickle cell trait [facility])     Past Surgical History:  Procedure Laterality Date  . APPENDECTOMY    . CARPAL TUNNEL RELEASE  03/23/2011   Procedure: CARPAL TUNNEL RELEASE;  Surgeon: Sanjuana Kava;  Location: [facility];  Service: Orthopedics;  Laterality: Left;  . CARPAL TUNNEL RELEASE  05/10/2011   Procedure: CARPAL TUNNEL RELEASE;  Surgeon: Sanjuana Kava, MD;  Location: [facility];  Service: Orthopedics;  Laterality: Right;  . CESAREAN SECTION     x 2  . INCISION AND DRAINAGE PERIRECTAL ABSCESS  12/21/09  . PROCTOSCOPY  10/17/2010   Procedure: PROCTOSCOPY;  Surgeon: Scherry Ran;  Location: [facility];  Service: General;  Laterality: N/A;  Rigid Proctoscopy/Possible Fistula in Ano  Procedure ended at 1003  . THERAPEUTIC ABORTION     x2    Family History  Problem Relation Age of Onset  . Kidney failure Daughter   . Sickle cell anemia Daughter   . Sickle cell trait  Daughter   . Sickle cell anemia Son   . Hypertension Mother   . Hyperlipidemia Mother   . Hypertension Father   . Lupus Father        skin  . Hypertension Other   . Sarcoidosis Sister   . Anesthesia problems Neg Hx   . Hypotension Neg Hx   . Malignant hyperthermia Neg Hx   . Pseudochol deficiency Neg Hx     Social History Social History  Substance Use Topics  . Smoking status: Current Every Day Smoker    Packs/day: 0.50    Years: 30.00    Types: Cigarettes  . Smokeless tobacco: Never Used  . Alcohol use No      Current Outpatient Prescriptions  Medication Sig Dispense Refill  . atorvastatin (LIPITOR) 20 MG tablet Take 1 tablet (20 mg total) by mouth daily. 90 tablet 2  . cyclobenzaprine (FLEXERIL) 10 MG tablet Take 1 tablet (10 mg total) by mouth 3 (three) times daily as needed for muscle spasms. 40 tablet 0  . ferrous sulfate 325 (65 FE) MG tablet Take 325 mg by mouth daily with breakfast.    . HYDROcodone-acetaminophen (NORCO/VICODIN) 5-325 MG tablet Take 1 tablet by mouth every 6 (six) hours as needed for moderate pain (Must last 14 days.Do not take and drive a car or use machinery.). 56 tablet 0  .  lisinopril-hydrochlorothiazide (PRINZIDE,ZESTORETIC) 20-12.5 MG tablet Take 1 tablet by mouth daily.    . Multiple Vitamin (MULTIVITAMIN WITH MINERALS) TABS tablet Take 1 tablet by mouth daily.    . nabumetone (RELAFEN) 750 MG tablet Take 1 tablet (750 mg total) by mouth 2 (two) times daily. One by mouth twice a day after eating. 60 tablet 5  . predniSONE (STERAPRED UNI-PAK 21 TAB) 5 MG (21) TBPK tablet Take 6 pills first day; 5 pills second day; 4 pills third day; 3 pills fourth day; 2 pills next day and 1 pill last day. 21 tablet 0  . silver sulfADIAZINE (SILVADENE) 1 % cream Apply 1 application topically 2 (two) times daily. 50 g 0  . sulfamethoxazole-trimethoprim (BACTRIM DS,SEPTRA DS) 800-160 MG tablet Take 1 tablet by mouth 2 (two) times daily. 28 tablet 1   No current  facility-administered medications for this visit.      Physical Exam  Blood pressure 100/70, pulse 87, temperature 98 F (36.7 C), height 5\' 8"  (1.727 m), weight 176 lb (79.8 kg), last menstrual period 08/12/2010.  Constitutional: overall normal hygiene, normal nutrition, well developed, normal grooming, normal body habitus. Assistive device:none  Musculoskeletal: gait and station Limp none, muscle tone and strength are normal, no tremors or atrophy is present.  .  Neurological: coordination overall normal.  Deep tendon reflex/nerve stretch intact.  Sensation normal.  Cranial nerves II-XII intact.   Skin:   Normal overall no scars, lesions, ulcers or rashes. No psoriasis.  Psychiatric: Alert and oriented x 3.  Recent memory intact, remote memory unclear.  Normal mood and affect. Well groomed.  Good eye contact.  Cardiovascular: overall no swelling, no varicosities, no edema bilaterally, normal temperatures of the legs and arms, no clubbing, cyanosis and good capillary refill.  Lymphatic: palpation is normal.  Both upper arms are negative.  She has full ROM of the shoulders, elbows and wrists and fingers.  NV intact.  Grips are normal.  The patient has been educated about the nature of the problem(s) and counseled on treatment options.  The patient appeared to understand what I have discussed and is in agreement with it.  Encounter Diagnoses  Name Primary?  . Pain of left hand Yes  . Essential hypertension, benign   . Cigarette nicotine dependence without complication    She continues to smoke and is willing to cut back.  PLAN Call if any problems.  Precautions discussed.  Continue current medications.   Return to clinic 1 month   Electronically Signed Sanjuana Kava, MD 8/8/201810:53 AM

## 2016-11-07 NOTE — Patient Instructions (Signed)
Steps to Quit Smoking Smoking tobacco can be bad for your health. It can also affect almost every organ in your body. Smoking puts you and people around you at risk for many serious [place]-lasting (chronic) diseases. Quitting smoking is hard, but it is one of the best things that you can do for your health. It is never too late to quit. What are the benefits of quitting smoking? When you quit smoking, you lower your risk for getting serious diseases and conditions. They can include:  Lung cancer or lung disease.  Heart disease.  Stroke.  Heart attack.  Not being able to have children (infertility).  Weak bones (osteoporosis) and broken bones (fractures).  If you have coughing, wheezing, and shortness of breath, those symptoms may get better when you quit. You may also get sick less often. If you are pregnant, quitting smoking can help to lower your chances of having a baby of low birth weight. What can I do to help me quit smoking? Talk with your doctor about what can help you quit smoking. Some things you can do (strategies) include:  Quitting smoking totally, instead of slowly cutting back how much you smoke over a period of time.  Going to in-person counseling. You are more likely to quit if you go to many counseling sessions.  Using resources and support systems, such as: ? Online chats with a counselor. ? Phone quitlines. ? Printed self-help materials. ? Support groups or group counseling. ? Text messaging programs. ? Mobile phone apps or applications.  Taking medicines. Some of these medicines may have nicotine in them. If you are pregnant or breastfeeding, do not take any medicines to quit smoking unless your doctor says it is okay. Talk with your doctor about counseling or other things that can help you.  Talk with your doctor about using more than one strategy at the same time, such as taking medicines while you are also going to in-person counseling. This can help make  quitting easier. What things can I do to make it easier to quit? Quitting smoking might feel very hard at first, but there is a lot that you can do to make it easier. Take these steps:  Talk to your family and friends. Ask them to support and encourage you.  Call phone quitlines, reach out to support groups, or work with a counselor.  Ask people who smoke to not smoke around you.  Avoid places that make you want (trigger) to smoke, such as: ? Bars. ? Parties. ? Smoke-break areas at work.  Spend time with people who do not smoke.  Lower the stress in your life. Stress can make you want to smoke. Try these things to help your stress: ? Getting regular exercise. ? Deep-breathing exercises. ? Yoga. ? Meditating. ? Doing a body scan. To do this, close your eyes, focus on one area of your body at a time from head to toe, and notice which parts of your body are tense. Try to relax the muscles in those areas.  Download or buy apps on your mobile phone or tablet that can help you stick to your quit plan. There are many free apps, such as QuitGuide from the CDC (Centers for Disease Control and Prevention). You can find more support from smokefree.gov and other websites.  This information is not intended to replace advice given to you by your health care provider. Make sure you discuss any questions you have with your health care provider. Document Released: 01/13/2009 Document   Revised: 11/15/2015 Document Reviewed: 08/03/2014 Elsevier Interactive Patient Education  2018 Elsevier Inc.  

## 2016-11-15 ENCOUNTER — Encounter (HOSPITAL_COMMUNITY): Payer: Self-pay | Admitting: Emergency Medicine

## 2016-11-15 ENCOUNTER — Emergency Department (HOSPITAL_COMMUNITY)
Admission: EM | Admit: 2016-11-15 | Discharge: 2016-11-15 | Disposition: A | Discharge status: Home / Self Care | Payer: BLUE CROSS/BLUE SHIELD | Attending: Emergency Medicine | Admitting: Emergency Medicine

## 2016-11-15 DIAGNOSIS — L739 Follicular disorder, unspecified: Secondary | ICD-10-CM | POA: Diagnosis not present

## 2016-11-15 DIAGNOSIS — F17210 Nicotine dependence, cigarettes, uncomplicated: Secondary | ICD-10-CM | POA: Diagnosis not present

## 2016-11-15 DIAGNOSIS — I10 Essential (primary) hypertension: Secondary | ICD-10-CM | POA: Diagnosis not present

## 2016-11-15 DIAGNOSIS — Z79899 Other long term (current) drug therapy: Secondary | ICD-10-CM | POA: Insufficient documentation

## 2016-11-15 DIAGNOSIS — L0231 Cutaneous abscess of buttock: Secondary | ICD-10-CM | POA: Diagnosis not present

## 2016-11-15 MED ORDER — CLINDAMYCIN HCL 300 MG PO CAPS: 300.0000 mg | ORAL_CAPSULE | Freq: Three times a day (TID) | ORAL | 0 refills | Status: DC

## 2016-11-15 MED ORDER — CLINDAMYCIN HCL 150 MG PO CAPS
300.0000 mg | ORAL_CAPSULE | Freq: Once | ORAL | Status: AC
  Administered 2016-11-15: 300 mg via ORAL
  Filled 2016-11-15: qty 2

## 2016-11-15 NOTE — ED Triage Notes (Signed)
Pt states she has 3 small abscesses in buttocks area. States this is a reoccurring thing for her and she just needs antibiotics.

## 2016-11-15 NOTE — ED Provider Notes (Signed)
[facility] DEPT Provider Note   CSN: [id number] Arrival date & time: 11/15/16  8841     History   Chief Complaint Chief Complaint  Patient presents with  . Abscess    HPI Amber Drake is a 56 y.o. female who presents to the ED for an abscess that started 2 days ago and has gotten worse. The abscess is located on the right buttock. Patient reports that she has had to have surgery in the past for similar problem. Patient denies fever, chills or other problems. She states that she just wanted to come early before it needed to be drained. Patient reports that she has been sitting in warm tubs of water and applying warm wet compresses to the area.   HPI  Past Medical History:  Diagnosis Date  . Anemia   . Arthritis   . Blood dyscrasia    sickle cell trait  . Carbuncle and furuncle   . Hypercholesterolemia   . Hypertension    does not take meds  . Sickle cell trait [facility])     Patient Active Problem List   Diagnosis Date Noted  . Hidradenitis 07/31/2016  . Recurrent boils 07/31/2016  . Hyperlipidemia 06/15/2015  . Pain of left hand 05/19/2015  . Hand pain, right 05/19/2015  . Hematuria 03/03/2015  . Esophageal reflux 03/03/2015  . Essential hypertension, benign 03/03/2015  . Bronchitis due to tobacco use ([place]) 10/17/2010    Past Surgical History:  Procedure Laterality Date  . APPENDECTOMY    . CARPAL TUNNEL RELEASE  03/23/2011   Procedure: CARPAL TUNNEL RELEASE;  Surgeon: Sanjuana Kava;  Location: [facility] ORS;  Service: Orthopedics;  Laterality: Left;  . CARPAL TUNNEL RELEASE  05/10/2011   Procedure: CARPAL TUNNEL RELEASE;  Surgeon: Sanjuana Kava, MD;  Location: [facility] ORS;  Service: Orthopedics;  Laterality: Right;  . CESAREAN SECTION     x 2  . INCISION AND DRAINAGE PERIRECTAL ABSCESS  12/21/09  . PROCTOSCOPY  10/17/2010   Procedure: PROCTOSCOPY;  Surgeon: Scherry Ran;  Location: [facility] ORS;  Service: General;  Laterality: N/A;  Rigid Proctoscopy/Possible Fistula in  Ano  Procedure ended at 1003  . THERAPEUTIC ABORTION     x2    OB History    Gravida Para Term Preterm AB Living   4 2 2   2 2    SAB TAB Ectopic Multiple Live Births                   Home Medications    Prior to Admission medications   Medication Sig Start Date End Date Taking? Authorizing Provider  atorvastatin (LIPITOR) 20 MG tablet Take 1 tablet (20 mg total) by mouth daily. 03/14/15   Soyla Dryer, PA-C  clindamycin (CLEOCIN) 300 MG capsule Take 1 capsule (300 mg total) by mouth 3 (three) times daily. 11/15/16   Ashley Murrain, NP  cyclobenzaprine (FLEXERIL) 10 MG tablet Take 1 tablet (10 mg total) by mouth 3 (three) times daily as needed for muscle spasms. 11/07/16   Sanjuana Kava, MD  ferrous sulfate 325 (65 FE) MG tablet Take 325 mg by mouth daily with breakfast.    [provider]  HYDROcodone-acetaminophen (NORCO/VICODIN) 5-325 MG tablet Take 1 tablet by mouth every 6 (six) hours as needed for moderate pain (Must last 14 days.Do not take and drive a car or use machinery.). 11/07/16   Sanjuana Kava, MD  lisinopril-hydrochlorothiazide (PRINZIDE,ZESTORETIC) 20-12.5 MG tablet Take 1 tablet by mouth daily.    [provider]  Multiple Vitamin (MULTIVITAMIN WITH MINERALS) TABS tablet Take 1 tablet by mouth daily.    [provider]  nabumetone (RELAFEN) 750 MG tablet Take 1 tablet (750 mg total) by mouth 2 (two) times daily. One by mouth twice a day after eating. 11/07/16   Sanjuana Kava, MD  predniSONE (STERAPRED UNI-PAK 21 TAB) 5 MG (21) TBPK tablet Take 6 pills first day; 5 pills second day; 4 pills third day; 3 pills fourth day; 2 pills next day and 1 pill last day. 10/23/16   Sanjuana Kava, MD  silver sulfADIAZINE (SILVADENE) 1 % cream Apply 1 application topically 2 (two) times daily. 07/31/16   Estill Dooms, NP  sulfamethoxazole-trimethoprim (BACTRIM DS,SEPTRA DS) 800-160 MG tablet Take 1 tablet by mouth 2 (two) times daily. 09/19/16   Estill Dooms, NP    Family History Family History  Problem Relation Age of Onset  . Kidney failure Daughter   . Sickle cell anemia Daughter   . Sickle cell trait Daughter   . Sickle cell anemia Son   . Hypertension Mother   . Hyperlipidemia Mother   . Hypertension Father   . Lupus Father        skin  . Hypertension Other   . Sarcoidosis Sister   . Anesthesia problems Neg Hx   . Hypotension Neg Hx   . Malignant hyperthermia Neg Hx   . Pseudochol deficiency Neg Hx     Social History Social History  Substance Use Topics  . Smoking status: Current Every Day Smoker    Packs/day: 0.50    Years: 30.00    Types: Cigarettes  . Smokeless tobacco: Never Used  . Alcohol use No     Allergies   Penicillins; Penicillins cross reactors; and Tape   Review of Systems Review of Systems  Constitutional: Negative for chills and fever.  HENT: Negative.   Respiratory: Negative for cough and shortness of breath.   Cardiovascular: Negative for chest pain.  Gastrointestinal: Negative for abdominal pain and vomiting.  Musculoskeletal: Negative for neck stiffness.  Skin: Positive for wound.  Neurological: Negative for headaches.  Psychiatric/Behavioral: The patient is not nervous/anxious.      Physical Exam Updated Vital Signs BP 121/78 (BP Location: Left Arm)   Pulse 78   Temp 98.1 F (36.7 C) (Oral)   Resp 18   Ht 5\' 6"  (1.676 m)   Wt 80.7 kg (178 lb)   LMP 08/12/2010   SpO2 100%   BMI 28.73 kg/m   Physical Exam  Constitutional: She is oriented to person, place, and time. She appears well-developed and well-nourished. No distress.  Eyes: EOM are normal.  Neck: Neck supple.  Cardiovascular: Normal rate.   Pulmonary/Chest: Effort normal.  Abdominal: Soft. There is no tenderness.  Genitourinary:     Genitourinary Comments: There is a small raised pustular area to the fold of the right buttock. And a second area to the left labia.  Musculoskeletal: Normal range of  motion.  Lymphadenopathy:       Right: No inguinal adenopathy present.       Left: No inguinal adenopathy present.  Neurological: She is alert and oriented to person, place, and time. No cranial nerve deficit.  Skin: Skin is warm and dry.  Psychiatric: She has a normal mood and affect. Her behavior is normal.  Nursing note and vitals reviewed.    ED Treatments / Results  Labs (all labs ordered are listed, but only abnormal results are displayed) Labs  Reviewed - No data to display Radiology No results found.  Procedures Procedures (including critical care time)  Medications Ordered in ED Medications - No data to display   Initial Impression / Assessment and Plan / ED Course  I have reviewed the triage vital signs and the nursing notes.   Final Clinical Impressions(s) / ED Diagnoses  56 y.o. female with hx of abscess here for antibiotics to treat areas that have started that are similar to areas that formed abscesses in the past. Patient appears stable for d/c without fever, red streaking and does not appear toxic. No indication for I&D at this time. Will start Clindamycin and patient to f/u with her PCP or return here for worsening symptoms.  Final diagnoses:  Folliculitis    New Prescriptions New Prescriptions   CLINDAMYCIN (CLEOCIN) 300 MG CAPSULE    Take 1 capsule (300 mg total) by mouth 3 (three) times daily.     Debroah Baller [place], [place] 11/15/16 1157    Davonna Belling, MD 11/15/16 [phone]

## 2016-11-15 NOTE — Discharge Instructions (Signed)
Sit in warm tubs of water or apply warm wet compresses to the area. Return for worsening symptoms.

## 2016-12-05 ENCOUNTER — Encounter: Payer: Self-pay | Admitting: Orthopaedic Surgery

## 2016-12-05 ENCOUNTER — Ambulatory Visit (INDEPENDENT_AMBULATORY_CARE_PROVIDER_SITE_OTHER): Payer: BLUE CROSS/BLUE SHIELD | Admitting: Orthopaedic Surgery

## 2016-12-05 VITALS — BP 100/70 | HR 75 | Temp 98.2°F | Ht 68.0 in | Wt 176.0 lb

## 2016-12-05 DIAGNOSIS — M79642 Pain in left hand: Secondary | ICD-10-CM | POA: Diagnosis not present

## 2016-12-05 DIAGNOSIS — F17210 Nicotine dependence, cigarettes, uncomplicated: Secondary | ICD-10-CM

## 2016-12-05 DIAGNOSIS — I10 Essential (primary) hypertension: Secondary | ICD-10-CM

## 2016-12-05 MED ORDER — HYDROCODONE-ACETAMINOPHEN 7.5-325 MG PO TABS: 1.0000 | ORAL_TABLET | Freq: Four times a day (QID) | ORAL | 0 refills | Status: DC | PRN

## 2016-12-05 NOTE — Patient Instructions (Signed)
Steps to Quit Smoking Smoking tobacco can be bad for your health. It can also affect almost every organ in your body. Smoking puts you and people around you at risk for many serious long-lasting (chronic) diseases. Quitting smoking is hard, but it is one of the best things that you can do for your health. It is never too late to quit. What are the benefits of quitting smoking? When you quit smoking, you lower your risk for getting serious diseases and conditions. They can include:  Lung cancer or lung disease.  Heart disease.  Stroke.  Heart attack.  Not being able to have children (infertility).  Weak bones (osteoporosis) and broken bones (fractures).  If you have coughing, wheezing, and shortness of breath, those symptoms may get better when you quit. You may also get sick less often. If you are pregnant, quitting smoking can help to lower your chances of having a baby of low birth weight. What can I do to help me quit smoking? Talk with your doctor about what can help you quit smoking. Some things you can do (strategies) include:  Quitting smoking totally, instead of slowly cutting back how much you smoke over a period of time.  Going to in-person counseling. You are more likely to quit if you go to many counseling sessions.  Using resources and support systems, such as: ? Online chats with a counselor. ? Phone quitlines. ? Printed self-help materials. ? Support groups or group counseling. ? Text messaging programs. ? Mobile phone apps or applications.  Taking medicines. Some of these medicines may have nicotine in them. If you are pregnant or breastfeeding, do not take any medicines to quit smoking unless your doctor says it is okay. Talk with your doctor about counseling or other things that can help you.  Talk with your doctor about using more than one strategy at the same time, such as taking medicines while you are also going to in-person counseling. This can help make  quitting easier. What things can I do to make it easier to quit? Quitting smoking might feel very hard at first, but there is a lot that you can do to make it easier. Take these steps:  Talk to your family and friends. Ask them to support and encourage you.  Call phone quitlines, reach out to support groups, or work with a counselor.  Ask people who smoke to not smoke around you.  Avoid places that make you want (trigger) to smoke, such as: ? Bars. ? Parties. ? Smoke-break areas at work.  Spend time with people who do not smoke.  Lower the stress in your life. Stress can make you want to smoke. Try these things to help your stress: ? Getting regular exercise. ? Deep-breathing exercises. ? Yoga. ? Meditating. ? Doing a body scan. To do this, close your eyes, focus on one area of your body at a time from head to toe, and notice which parts of your body are tense. Try to relax the muscles in those areas.  Download or buy apps on your mobile phone or tablet that can help you stick to your quit plan. There are many free apps, such as QuitGuide from the CDC (Centers for Disease Control and Prevention). You can find more support from smokefree.gov and other websites.  This information is not intended to replace advice given to you by your health care provider. Make sure you discuss any questions you have with your health care provider. Document Released: 01/13/2009 Document   Revised: 11/15/2015 Document Reviewed: 08/03/2014 Elsevier Interactive Patient Education  2018 Elsevier Inc.  

## 2016-12-05 NOTE — Progress Notes (Signed)
Patient DD:UKGURK Amber Drake, female DOB:1960-04-30, 56 y.o. YHC:[id number]  Chief Complaint  Patient presents with  . Arm Pain    left    HPI  GERALINE HALBERSTADT is a 56 y.o. female who has chronic pain of the left hand and arm.  She has no new trauma.  She has no redness.  She has some swelling at times.  She is active working.  She has resumed smoking after having stopped over a year.  We talked about stopping again. HPI  Body mass index is 26.76 kg/m.  ROS  Review of Systems  HENT: Negative for congestion.   Respiratory: Negative for cough and shortness of breath.   Cardiovascular: Negative for chest pain and leg swelling.  Endocrine: Positive for cold intolerance.  Musculoskeletal: Positive for arthralgias.  Allergic/Immunologic: Positive for environmental allergies.    Past Medical History:  Diagnosis Date  . Anemia   . Arthritis   . Blood dyscrasia    sickle cell trait  . Carbuncle and furuncle   . Hypercholesterolemia   . Hypertension    does not take meds  . Sickle cell trait [facility])     Past Surgical History:  Procedure Laterality Date  . APPENDECTOMY    . CARPAL TUNNEL RELEASE  03/23/2011   Procedure: CARPAL TUNNEL RELEASE;  Surgeon: Sanjuana Kava;  Location: [facility] OR[facility];  Service: Orthopedics;  Laterality: Left;  . CARPAL TUNNEL RELEASE  05/10/2011   Procedure: CARPAL TUNNEL RELEASE;  Surgeon: Sanjuana Kava, MD;  Location: [facility];  Service: Orthopedics;  Laterality: Right;  . CESAREAN SECTION     x 2  . INCISION AND DRAINAGE PERIRECTAL ABSCESS  12/21/09  . PROCTOSCOPY  10/17/2010   Procedure: PROCTOSCOPY;  Surgeon: Scherry Ran;  Location: [facility] ORS;  Service: General;  Laterality: N/A;  Rigid Proctoscopy/Possible Fistula in Ano  Procedure ended at 1003  . THERAPEUTIC ABORTION     x2    Family History  Problem Relation Age of Onset  . Kidney failure Daughter   . Sickle cell anemia Daughter   . Sickle cell trait Daughter   . Sickle cell anemia Son   .  Hypertension Mother   . Hyperlipidemia Mother   . Hypertension Father   . Lupus Father        skin  . Hypertension Other   . Sarcoidosis Sister   . Anesthesia problems Neg Hx   . Hypotension Neg Hx   . Malignant hyperthermia Neg Hx   . Pseudochol deficiency Neg Hx     Social History Social History  Substance Use Topics  . Smoking status: Current Every Day Smoker    Packs/day: 0.50    Years: 30.00    Types: Cigarettes  . Smokeless tobacco: Never Used  . Alcohol use No      Current Outpatient Prescriptions  Medication Sig Dispense Refill  . atorvastatin (LIPITOR) 20 MG tablet Take 1 tablet (20 mg total) by mouth daily. 90 tablet 2  . clindamycin (CLEOCIN) 300 MG capsule Take 1 capsule (300 mg total) by mouth 3 (three) times daily. 30 capsule 0  . cyclobenzaprine (FLEXERIL) 10 MG tablet Take 1 tablet (10 mg total) by mouth 3 (three) times daily as needed for muscle spasms. 40 tablet 0  . ferrous sulfate 325 (65 FE) MG tablet Take 325 mg by mouth daily with breakfast.    . HYDROcodone-acetaminophen (NORCO) 7.5-325 MG tablet Take 1 tablet by mouth every 6 (six) hours as needed for moderate pain (Must  last 30 days.Do not drive a call or operate machinery while on this medicine.). 90 tablet 0  . lisinopril-hydrochlorothiazide (PRINZIDE,ZESTORETIC) 20-12.5 MG tablet Take 1 tablet by mouth daily.    . Multiple Vitamin (MULTIVITAMIN WITH MINERALS) TABS tablet Take 1 tablet by mouth daily.    . nabumetone (RELAFEN) 750 MG tablet Take 1 tablet (750 mg total) by mouth 2 (two) times daily. One by mouth twice a day after eating. 60 tablet 5  . predniSONE (STERAPRED UNI-PAK 21 TAB) 5 MG (21) TBPK tablet Take 6 pills first day; 5 pills second day; 4 pills third day; 3 pills fourth day; 2 pills next day and 1 pill last day. 21 tablet 0  . silver sulfADIAZINE (SILVADENE) 1 % cream Apply 1 application topically 2 (two) times daily. 50 g 0  . sulfamethoxazole-trimethoprim (BACTRIM DS,SEPTRA DS)  800-160 MG tablet Take 1 tablet by mouth 2 (two) times daily. 28 tablet 1   No current facility-administered medications for this visit.      Physical Exam  Blood pressure 100/70, pulse 75, temperature 98.2 F (36.8 C), height 5\' 8"  (1.727 m), weight 176 lb (79.8 kg), last menstrual period 08/12/2010.  Constitutional: overall normal hygiene, normal nutrition, well developed, normal grooming, normal body habitus. Assistive device:none  Musculoskeletal: gait and station Limp none, muscle tone and strength are normal, no tremors or atrophy is present.  .  Neurological: coordination overall normal.  Deep tendon reflex/nerve stretch intact.  Sensation normal.  Cranial nerves II-XII intact.   Skin:   Normal overall no scars, lesions, ulcers or rashes. No psoriasis.  Psychiatric: Alert and oriented x 3.  Recent memory intact, remote memory unclear.  Normal mood and affect. Well groomed.  Good eye contact.  Cardiovascular: overall no swelling, no varicosities, no edema bilaterally, normal temperatures of the legs and arms, no clubbing, cyanosis and good capillary refill.  Lymphatic: palpation is normal.  Left hand has no numbness, full ROM, normal grip, no swelling.  NV intact.  The patient has been educated about the nature of the problem(s) and counseled on treatment options.  The patient appeared to understand what I have discussed and is in agreement with it.  Encounter Diagnoses  Name Primary?  . Pain of left hand Yes  . Essential hypertension, benign   . Cigarette nicotine dependence without complication     PLAN Call if any problems.  Precautions discussed.  Continue current medications.   Return to clinic 6 weeks   I have reviewed the [facility] web site prior to prescribing narcotic medicine for this patient.  Electronically Signed Sanjuana Kava, MD 9/5/201810:44 AM

## 2016-12-20 ENCOUNTER — Emergency Department (HOSPITAL_COMMUNITY)
Admission: EM | Admit: 2016-12-20 | Discharge: 2016-12-20 | Disposition: A | Discharge status: Home / Self Care | Payer: BLUE CROSS/BLUE SHIELD | Attending: Emergency Medicine | Admitting: Emergency Medicine

## 2016-12-20 ENCOUNTER — Encounter (HOSPITAL_COMMUNITY): Payer: Self-pay | Admitting: *Deleted

## 2016-12-20 DIAGNOSIS — Z5321 Procedure and treatment not carried out due to patient leaving prior to being seen by health care provider: Secondary | ICD-10-CM | POA: Insufficient documentation

## 2016-12-20 DIAGNOSIS — R0981 Nasal congestion: Secondary | ICD-10-CM | POA: Insufficient documentation

## 2016-12-20 NOTE — ED Notes (Signed)
Nurse completed triage and pt states she is going to leave and go home, she will come back tomorrow.

## 2016-12-20 NOTE — ED Triage Notes (Signed)
Pt began having nasal congestion, sore throat, and nasal pressure starting yesterday. Denies any n/v/d.

## 2016-12-21 ENCOUNTER — Encounter (HOSPITAL_COMMUNITY): Payer: Self-pay | Admitting: Cardiology

## 2016-12-21 ENCOUNTER — Emergency Department (HOSPITAL_COMMUNITY): Payer: BLUE CROSS/BLUE SHIELD

## 2016-12-21 ENCOUNTER — Emergency Department (HOSPITAL_COMMUNITY)
Admission: EM | Admit: 2016-12-21 | Discharge: 2016-12-21 | Disposition: A | Discharge status: Home / Self Care | Payer: BLUE CROSS/BLUE SHIELD | Attending: Emergency Medicine | Admitting: Emergency Medicine

## 2016-12-21 DIAGNOSIS — R51 Headache: Secondary | ICD-10-CM | POA: Diagnosis not present

## 2016-12-21 DIAGNOSIS — Z79899 Other long term (current) drug therapy: Secondary | ICD-10-CM | POA: Diagnosis not present

## 2016-12-21 DIAGNOSIS — R0981 Nasal congestion: Secondary | ICD-10-CM | POA: Insufficient documentation

## 2016-12-21 DIAGNOSIS — I10 Essential (primary) hypertension: Secondary | ICD-10-CM | POA: Insufficient documentation

## 2016-12-21 DIAGNOSIS — R05 Cough: Secondary | ICD-10-CM | POA: Insufficient documentation

## 2016-12-21 DIAGNOSIS — R059 Cough, unspecified: Secondary | ICD-10-CM

## 2016-12-21 DIAGNOSIS — F17210 Nicotine dependence, cigarettes, uncomplicated: Secondary | ICD-10-CM | POA: Diagnosis not present

## 2016-12-21 MED ORDER — BENZONATATE 100 MG PO CAPS
100.0000 mg | ORAL_CAPSULE | Freq: Once | ORAL | Status: AC
  Administered 2016-12-21: 100 mg via ORAL
  Filled 2016-12-21: qty 1

## 2016-12-21 MED ORDER — AZITHROMYCIN 250 MG PO TABS: 250.0000 mg | ORAL_TABLET | Freq: Every day | ORAL | 0 refills | Status: AC

## 2016-12-21 MED ORDER — BENZONATATE 100 MG PO CAPS: 100.0000 mg | ORAL_CAPSULE | Freq: Three times a day (TID) | ORAL | 0 refills | Status: DC

## 2016-12-21 MED ORDER — AZITHROMYCIN 250 MG PO TABS
500.0000 mg | ORAL_TABLET | Freq: Once | ORAL | Status: AC
  Administered 2016-12-21: 500 mg via ORAL
  Filled 2016-12-21: qty 2

## 2016-12-21 NOTE — ED Triage Notes (Signed)
Chest congestion and cough times 2 weeks.

## 2016-12-21 NOTE — ED Triage Notes (Signed)
reports cough and congestion x 2 weeks  Dr Legrand Rams PCP

## 2016-12-21 NOTE — ED Provider Notes (Signed)
[facility] DEPT Provider Note   CSN: [id number] Arrival date & time: 12/21/16  1141     History   Chief Complaint Chief Complaint  Patient presents with  . Cough    HPI Amber Drake is a 56 y.o. female presenting with sudden onset productive cough for the last 2 days. 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 Denies fever, chills, nausea, vomiting or any other symptoms.  Patient is a current every day smoker HPI  Past Medical History:  Diagnosis Date  . Anemia   . Arthritis   . Blood dyscrasia    sickle cell trait  . Carbuncle and furuncle   . Hypercholesterolemia   . Hypertension    does not take meds  . Sickle cell trait [facility])     Patient Active Problem List   Diagnosis Date Noted  . Hidradenitis 07/31/2016  . Recurrent boils 07/31/2016  . Hyperlipidemia 06/15/2015  . Pain of left hand 05/19/2015  . Hand pain, right 05/19/2015  . Hematuria 03/03/2015  . Esophageal reflux 03/03/2015  . Essential hypertension, benign 03/03/2015  . Bronchitis due to tobacco use ([place]) 10/17/2010    Past Surgical History:  Procedure Laterality Date  . APPENDECTOMY    . CARPAL TUNNEL RELEASE  03/23/2011   Procedure: CARPAL TUNNEL RELEASE;  Surgeon: Sanjuana Kava;  Location: [facility];  Service: Orthopedics;  Laterality: Left;  . CARPAL TUNNEL RELEASE  05/10/2011   Procedure: CARPAL TUNNEL RELEASE;  Surgeon: Sanjuana Kava, MD;  Location: [facility];  Service: Orthopedics;  Laterality: Right;  . CESAREAN SECTION     x 2  . INCISION AND DRAINAGE PERIRECTAL ABSCESS  12/21/09  . PROCTOSCOPY  10/17/2010   Procedure: PROCTOSCOPY;  Surgeon: Scherry Ran;  Location: [facility];  Service: General;  Laterality: N/A;  Rigid Proctoscopy/Possible Fistula in Ano  Procedure ended at 1003  . THERAPEUTIC ABORTION     x2    OB History    Gravida Para Term Preterm AB Living   4 2 2   2 2    SAB TAB Ectopic Multiple Live Births                   Home Medications    Prior to  Admission medications   Medication Sig Start Date End Date Taking? Authorizing Provider  atorvastatin (LIPITOR) 20 MG tablet Take 1 tablet (20 mg total) by mouth daily. 03/14/15   Soyla Dryer, PA-C  azithromycin (ZITHROMAX Z-PAK) 250 MG tablet Take 1 tablet (250 mg total) by mouth daily. 12/21/16 12/25/16  Avie Echevaria B, PA-C  benzonatate (TESSALON) 100 MG capsule Take 1 capsule (100 mg total) by mouth every 8 (eight) hours. 12/21/16   Emeline General, PA-C  clindamycin (CLEOCIN) 300 MG capsule Take 1 capsule (300 mg total) by mouth 3 (three) times daily. 11/15/16   Ashley Murrain, NP  cyclobenzaprine (FLEXERIL) 10 MG tablet Take 1 tablet (10 mg total) by mouth 3 (three) times daily as needed for muscle spasms. 11/07/16   Sanjuana Kava, MD  ferrous sulfate 325 (65 FE) MG tablet Take 325 mg by mouth daily with breakfast.    [provider]  HYDROcodone-acetaminophen (NORCO) 7.5-325 MG tablet Take 1 tablet by mouth every 6 (six) hours as needed for moderate pain (Must last 30 days.Do not drive a call or operate machinery while on this medicine.). 12/05/16   Sanjuana Kava, MD  lisinopril-hydrochlorothiazide (PRINZIDE,ZESTORETIC) 20-12.5 MG tablet Take 1 tablet by mouth daily.  [provider]  Multiple Vitamin (MULTIVITAMIN WITH MINERALS) TABS tablet Take 1 tablet by mouth daily.    [provider]  nabumetone (RELAFEN) 750 MG tablet Take 1 tablet (750 mg total) by mouth 2 (two) times daily. One by mouth twice a day after eating. 11/07/16   Sanjuana Kava, MD  predniSONE (STERAPRED UNI-PAK 21 TAB) 5 MG (21) TBPK tablet Take 6 pills first day; 5 pills second day; 4 pills third day; 3 pills fourth day; 2 pills next day and 1 pill last day. 10/23/16   Sanjuana Kava, MD  silver sulfADIAZINE (SILVADENE) 1 % cream Apply 1 application topically 2 (two) times daily. 07/31/16   Estill Dooms, NP  sulfamethoxazole-trimethoprim (BACTRIM DS,SEPTRA DS) 800-160 MG tablet Take 1  tablet by mouth 2 (two) times daily. 09/19/16   Estill Dooms, NP    Family History Family History  Problem Relation Age of Onset  . Kidney failure Daughter   . Sickle cell anemia Daughter   . Sickle cell trait Daughter   . Sickle cell anemia Son   . Hypertension Mother   . Hyperlipidemia Mother   . Hypertension Father   . Lupus Father        skin  . Hypertension Other   . Sarcoidosis Sister   . Anesthesia problems Neg Hx   . Hypotension Neg Hx   . Malignant hyperthermia Neg Hx   . Pseudochol deficiency Neg Hx     Social History Social History  Substance Use Topics  . Smoking status: Current Every Day Smoker    Packs/day: 0.50    Years: 30.00    Types: Cigarettes  . Smokeless tobacco: Never Used  . Alcohol use No     Allergies   Penicillins; Penicillins cross reactors; and Tape   Review of Systems Review of Systems  Constitutional: Negative for chills and fever.  HENT: Positive for congestion. Negative for ear pain, facial swelling, sinus pain, sinus pressure, sore throat and trouble swallowing.   Eyes: Negative for pain and visual disturbance.  Respiratory: Positive for cough. Negative for choking, chest tightness, shortness of breath, wheezing and stridor.   Cardiovascular: Negative for chest pain, palpitations and leg swelling.  Gastrointestinal: Negative for abdominal pain, nausea and vomiting.  Genitourinary: Negative for dysuria and hematuria.  Musculoskeletal: Negative for arthralgias, back pain, gait problem, joint swelling, myalgias, neck pain and neck stiffness.  Skin: Negative for color change, pallor and rash.  Neurological: Positive for headaches. Negative for dizziness, tremors, seizures, syncope, facial asymmetry, speech difficulty, weakness, light-headedness and numbness.       Mild headache     Physical Exam Updated Vital Signs BP (!) 111/96 (BP Location: Right Arm)   Pulse 91   Temp 98.2 F (36.8 C)   Resp 18   Ht 5\' 7"  (1.702 m)    Wt 81.6 kg (180 lb)   LMP 08/12/2010   SpO2 100%   BMI 28.19 kg/m   Physical Exam  Constitutional: She is oriented to person, place, and time. She appears well-developed and well-nourished. No distress.  Afebrile, nontoxic-appearing, sitting comfortably in bed in no acute distress.  HENT:  Head: Normocephalic and atraumatic.  Mouth/Throat: Oropharynx is clear and moist. No oropharyngeal exudate.  Eyes: Conjunctivae and EOM are normal.  Neck: Normal range of motion. Neck supple.  Cardiovascular: Normal rate, regular rhythm, normal heart sounds and intact distal pulses.   No murmur heard. Pulmonary/Chest: Effort normal and breath sounds normal. No respiratory distress. She has  no wheezes. She has no rales. She exhibits no tenderness.  Abdominal: Soft. There is no tenderness.  Musculoskeletal: Normal range of motion. She exhibits no edema, tenderness or deformity.  Lymphadenopathy:    She has no cervical adenopathy.  Neurological: She is alert and oriented to person, place, and time. No cranial nerve deficit or sensory deficit. She exhibits normal muscle tone. Coordination normal.  Skin: Skin is warm and dry. No rash noted. She is not diaphoretic. No erythema. No pallor.  Psychiatric: She has a normal mood and affect.  Nursing note and vitals reviewed.    ED Treatments / Results  Labs (all labs ordered are listed, but only abnormal results are displayed) Labs Reviewed - No data to display  EKG  EKG Interpretation None       Radiology Dg Chest 2 View  Result Date: 12/21/2016 CLINICAL DATA:  Productive cough for 2 day EXAM: CHEST  2 VIEW COMPARISON:  06/26/2014 FINDINGS: Bibasilar linear opacities are stable. No pneumothorax or pleural effusion. No pleural effusion. No pneumothorax or pleural effusion. IMPRESSION: Chronic linear opacities at the lung bases. Electronically Signed   By: Marybelle Killings M.D.   On: 12/21/2016 12:40    Procedures Procedures (including critical care  time)  Medications Ordered in ED Medications  benzonatate (TESSALON) capsule 100 mg (not administered)  azithromycin (ZITHROMAX) tablet 500 mg (not administered)     Initial Impression / Assessment and Plan / ED Course  I have reviewed the triage vital signs and the nursing notes.  Pertinent labs & imaging results that were available during my care of the patient were reviewed by me and considered in my medical decision making (see chart for details).       Presenting with a productive cough and is in every day smoker. X-ray negative for acute abnormality Lungs CTA bilaterally Given smoking status patient was prescribed a Z-Pak  Discharge home with symptomatic relief and follow-up with PCP.  Discussed strict return precautions and advised to return to the emergency department if experiencing any new or worsening symptoms. Instructions were understood and patient agreed with discharge plan.  Final Clinical Impressions(s) / ED Diagnoses   Final diagnoses:  Cough    New Prescriptions New Prescriptions   AZITHROMYCIN (ZITHROMAX Z-PAK) 250 MG TABLET    Take 1 tablet (250 mg total) by mouth daily.   BENZONATATE (TESSALON) 100 MG CAPSULE    Take 1 capsule (100 mg total) by mouth every 8 (eight) hours.     Dossie Der 12/21/16 Pauline Aus    Julianne Rice, MD 12/22/16 [phone]

## 2016-12-21 NOTE — Discharge Instructions (Signed)
As discussed, make sure you stay well hydrated. Take your entire course of antibiotics even if you felt better. Try to get some rest. Follow-up with your primary care provider as needed. Return if symptoms worsen, you experience chest pain, shortness of breath, worsening cough and fever or any other new concerning symptoms in the meantime.

## 2017-01-12 ENCOUNTER — Emergency Department (HOSPITAL_COMMUNITY)
Admission: EM | Admit: 2017-01-12 | Discharge: 2017-01-12 | Disposition: A | Discharge status: Home / Self Care | Payer: BLUE CROSS/BLUE SHIELD | Attending: Emergency Medicine | Admitting: Emergency Medicine

## 2017-01-12 ENCOUNTER — Encounter (HOSPITAL_COMMUNITY): Payer: Self-pay | Admitting: Emergency Medicine

## 2017-01-12 DIAGNOSIS — R21 Rash and other nonspecific skin eruption: Secondary | ICD-10-CM | POA: Insufficient documentation

## 2017-01-12 DIAGNOSIS — B9689 Other specified bacterial agents as the cause of diseases classified elsewhere: Secondary | ICD-10-CM | POA: Diagnosis not present

## 2017-01-12 DIAGNOSIS — I10 Essential (primary) hypertension: Secondary | ICD-10-CM | POA: Insufficient documentation

## 2017-01-12 DIAGNOSIS — N898 Other specified noninflammatory disorders of vagina: Secondary | ICD-10-CM | POA: Diagnosis not present

## 2017-01-12 DIAGNOSIS — F17210 Nicotine dependence, cigarettes, uncomplicated: Secondary | ICD-10-CM | POA: Diagnosis not present

## 2017-01-12 DIAGNOSIS — N760 Acute vaginitis: Secondary | ICD-10-CM | POA: Insufficient documentation

## 2017-01-12 DIAGNOSIS — N899 Noninflammatory disorder of vagina, unspecified: Secondary | ICD-10-CM | POA: Insufficient documentation

## 2017-01-12 DIAGNOSIS — N764 Abscess of vulva: Secondary | ICD-10-CM | POA: Diagnosis not present

## 2017-01-12 DIAGNOSIS — Z79899 Other long term (current) drug therapy: Secondary | ICD-10-CM | POA: Diagnosis not present

## 2017-01-12 LAB — WET PREP, GENITAL
Sperm: NONE SEEN
Trich, Wet Prep: NONE SEEN
Yeast Wet Prep HPF POC: NONE SEEN

## 2017-01-12 MED ORDER — METRONIDAZOLE 500 MG PO TABS: 500.0000 mg | ORAL_TABLET | Freq: Two times a day (BID) | ORAL | 0 refills | Status: DC

## 2017-01-12 MED ORDER — DOXYCYCLINE HYCLATE 100 MG PO CAPS: 100.0000 mg | ORAL_CAPSULE | Freq: Two times a day (BID) | ORAL | 0 refills | Status: DC

## 2017-01-12 NOTE — ED Triage Notes (Signed)
Patient c/o abscess and swollen lymph node to left groin x3 days. Unsure of any fevers but reports body aches. Patient states taking "sulfa antibiotics" with no improvement.

## 2017-01-12 NOTE — Discharge Instructions (Signed)
Take your medicines exactly as prescribed  Flagyl twice daily for 7 days Motrin as needed for pain Continue Bactrim

## 2017-01-12 NOTE — ED Provider Notes (Signed)
[facility] DEPT Provider Note   CSN: [id number] Arrival date & time: 01/12/17  0708     History   Chief Complaint Chief Complaint  Patient presents with  . Abscess    HPI Amber Drake is a 56 y.o. female.  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  Sx are constant - worse with palpation No drainage, no pus, no back or abd pain No vag d/c - states she is concerned about STDs  Past Medical History:  Diagnosis Date  . Anemia   . Arthritis   . Blood dyscrasia    sickle cell trait  . Carbuncle and furuncle   . Hypercholesterolemia   . Hypertension    does not take meds  . Sickle cell trait [facility])     Patient Active Problem List   Diagnosis Date Noted  . Hidradenitis 07/31/2016  . Recurrent boils 07/31/2016  . Hyperlipidemia 06/15/2015  . Pain of left hand 05/19/2015  . Hand pain, right 05/19/2015  . Hematuria 03/03/2015  . Esophageal reflux 03/03/2015  . Essential hypertension, benign 03/03/2015  . Bronchitis due to tobacco use ([place]) 10/17/2010    Past Surgical History:  Procedure Laterality Date  . APPENDECTOMY    . CARPAL TUNNEL RELEASE  03/23/2011   Procedure: CARPAL TUNNEL RELEASE;  Surgeon: Sanjuana Kava;  Location: [facility];  Service: Orthopedics;  Laterality: Left;  . CARPAL TUNNEL RELEASE  05/10/2011   Procedure: CARPAL TUNNEL RELEASE;  Surgeon: Sanjuana Kava, MD;  Location: [facility];  Service: Orthopedics;  Laterality: Right;  . CESAREAN SECTION     x 2  . INCISION AND DRAINAGE PERIRECTAL ABSCESS  12/21/09  . PROCTOSCOPY  10/17/2010   Procedure: PROCTOSCOPY;  Surgeon: Scherry Ran;  Location: [facility];  Service: General;  Laterality: N/A;  Rigid Proctoscopy/Possible Fistula in Ano  Procedure ended at 1003  . THERAPEUTIC ABORTION     x2    OB  History    Gravida Para Term Preterm AB Living   4 2 2   2 2    SAB TAB Ectopic Multiple Live Births                   Home Medications    Prior to Admission medications   Medication Sig Start Date End Date Taking? Authorizing Provider  atorvastatin (LIPITOR) 20 MG tablet Take 1 tablet (20 mg total) by mouth daily. 03/14/15   Soyla Dryer, PA-C  benzonatate (TESSALON) 100 MG capsule Take 1 capsule (100 mg total) by mouth every 8 (eight) hours. 12/21/16   Emeline General, PA-C  clindamycin (CLEOCIN) 300 MG capsule Take 1 capsule (300 mg total) by mouth 3 (three) times daily. 11/15/16   Ashley Murrain, NP  cyclobenzaprine (FLEXERIL) 10 MG tablet Take 1 tablet (10 mg total) by mouth 3 (three) times daily as needed for muscle spasms. 11/07/16   Sanjuana Kava, MD  ferrous sulfate 325 (65 FE) MG tablet Take 325 mg by mouth daily with breakfast.    [provider]  HYDROcodone-acetaminophen (NORCO) 7.5-325 MG tablet Take 1 tablet by mouth every 6 (six) hours as needed for moderate pain (Must last 30 days.Do not drive a call or operate machinery while on this medicine.).  12/05/16   Sanjuana Kava, MD  lisinopril-hydrochlorothiazide (PRINZIDE,ZESTORETIC) 20-12.5 MG tablet Take 1 tablet by mouth daily.    [provider]  Multiple Vitamin (MULTIVITAMIN WITH MINERALS) TABS tablet Take 1 tablet by mouth daily.    [provider]  nabumetone (RELAFEN) 750 MG tablet Take 1 tablet (750 mg total) by mouth 2 (two) times daily. One by mouth twice a day after eating. 11/07/16   Sanjuana Kava, MD  predniSONE (STERAPRED UNI-PAK 21 TAB) 5 MG (21) TBPK tablet Take 6 pills first day; 5 pills second day; 4 pills third day; 3 pills fourth day; 2 pills next day and 1 pill last day. 10/23/16   Sanjuana Kava, MD  silver sulfADIAZINE (SILVADENE) 1 % cream Apply 1 application topically 2 (two) times daily. 07/31/16   Estill Dooms, NP  sulfamethoxazole-trimethoprim (BACTRIM DS,SEPTRA DS)  800-160 MG tablet Take 1 tablet by mouth 2 (two) times daily. 09/19/16   Estill Dooms, NP    Family History Family History  Problem Relation Age of Onset  . Kidney failure Daughter   . Sickle cell anemia Daughter   . Sickle cell trait Daughter   . Sickle cell anemia Son   . Hypertension Mother   . Hyperlipidemia Mother   . Hypertension Father   . Lupus Father        skin  . Hypertension Other   . Sarcoidosis Sister   . Anesthesia problems Neg Hx   . Hypotension Neg Hx   . Malignant hyperthermia Neg Hx   . Pseudochol deficiency Neg Hx     Social History Social History  Substance Use Topics  . Smoking status: Current Every Day Smoker    Packs/day: 0.50    Years: 30.00    Types: Cigarettes  . Smokeless tobacco: Never Used  . Alcohol use No     Allergies   Penicillins; Penicillins cross reactors; and Tape   Review of Systems Review of Systems  Constitutional: Negative for fever.  Skin: Positive for rash.     Physical Exam Updated Vital Signs BP 114/67 (BP Location: Right Arm)   Pulse 94   Temp 98.5 F (36.9 C) (Oral)   Resp 20   Ht 5\' 7"  (1.702 m)   Wt 81.6 kg (180 lb)   LMP 08/12/2010   SpO2 97%   BMI 28.19 kg/m   Physical Exam  Constitutional: She appears well-developed and well-nourished.  HENT:  Head: Normocephalic and atraumatic.  Eyes: Conjunctivae are normal. Right eye exhibits no discharge. Left eye exhibits no discharge.  Pulmonary/Chest: Effort normal. No respiratory distress.  Genitourinary:  Genitourinary Comments: LAD of the L inguinal area Normal appearing ext genitalia other than 2, 3-4 mm tender lumps around the L sided labia Mild d/c in vaginal vault.  Neurological: She is alert. Coordination normal.  Skin: Skin is warm and dry. No rash noted. She is not diaphoretic. No erythema.  Psychiatric: She has a normal mood and affect.  Nursing note and vitals reviewed.    ED Treatments / Results  Labs (all labs ordered are  listed, but only abnormal results are displayed) Labs Reviewed  WET PREP, GENITAL  GC/CHLAMYDIA PROBE AMP ([place]) NOT AT [facility]     Radiology No results found.  Procedures Procedures (including critical care time)  Medications Ordered in ED Medications - No data to display   Initial Impression / Assessment and Plan / ED Course  I have reviewed the triage vital signs and the nursing notes.  Pertinent labs & imaging results that were available during my care of the patient were reviewed by me and considered in my medical decision making (see chart for details).     On Bactrim - has no drainable abscess - question whether this is abscess vs nodule. Has BV, no other signs of serious infection and the nodules around the vaginal opening seem to be hard nodules and not pustules or fluctuant abscesses.  She has some mild L inguinal LAD which she has had for a while.    I discussed with the pt that she may need a biopsy or different evaluation if she is not getting any better with bactrim - this could be something else.  She does have extensive scarring across her perineum from prior surgical excision of what appears to have been Hidradenitis Suppurativa.  This does not look the same as today's complaint.  Pt expressed her understanding.  Final Clinical Impressions(s) / ED Diagnoses   Final diagnoses:  Nodule of vagina  Bacterial vaginitis    New Prescriptions Discharge Medication List as of 01/12/2017  9:24 AM    START taking these medications   Details  metroNIDAZOLE (FLAGYL) 500 MG tablet Take 1 tablet (500 mg total) by mouth 2 (two) times daily., Starting Sat 01/12/2017, Print         Noemi Chapel, MD 01/13/17 [phone]

## 2017-01-14 LAB — GC/CHLAMYDIA PROBE AMP (~~LOC~~) NOT AT ARMC
CHLAMYDIA, DNA PROBE: NEGATIVE
NEISSERIA GONORRHEA: NEGATIVE

## 2017-01-16 ENCOUNTER — Encounter: Payer: Self-pay | Admitting: Orthopaedic Surgery

## 2017-01-16 ENCOUNTER — Ambulatory Visit (INDEPENDENT_AMBULATORY_CARE_PROVIDER_SITE_OTHER): Payer: BLUE CROSS/BLUE SHIELD | Admitting: Orthopaedic Surgery

## 2017-01-16 ENCOUNTER — Ambulatory Visit: Payer: BLUE CROSS/BLUE SHIELD | Admitting: Orthopaedic Surgery

## 2017-01-16 VITALS — BP 101/62 | HR 89 | Temp 97.8°F | Ht 67.0 in | Wt 172.0 lb

## 2017-01-16 DIAGNOSIS — M79642 Pain in left hand: Secondary | ICD-10-CM

## 2017-01-16 DIAGNOSIS — I10 Essential (primary) hypertension: Secondary | ICD-10-CM

## 2017-01-16 DIAGNOSIS — F17210 Nicotine dependence, cigarettes, uncomplicated: Secondary | ICD-10-CM | POA: Diagnosis not present

## 2017-01-16 DIAGNOSIS — M79641 Pain in right hand: Secondary | ICD-10-CM | POA: Diagnosis not present

## 2017-01-16 MED ORDER — HYDROCODONE-ACETAMINOPHEN 7.5-325 MG PO TABS: 1.0000 | ORAL_TABLET | Freq: Four times a day (QID) | ORAL | 0 refills | Status: DC | PRN

## 2017-01-16 NOTE — Progress Notes (Signed)
Patient Amber Drake [name], female DOB:1960-08-24, 56 y.o. LYY:[id number]  Chief Complaint  Patient presents with  . Follow-up    Left hand pain    HPI  Amber Drake is a 56 y.o. female who has hand pain, shoulder pain on the right and right knee pain.  She has more pain in the hands that seem to get worse.  She has no numbness, no redness.  She has more pain first thing in the morning and then at the end of the day.  She has not responded   I will get CBC, sed rate, ANA, RA factor lab work.  HPI  Body mass index is 26.94 kg/m.  ROS  Review of Systems  HENT: Negative for congestion.   Respiratory: Negative for cough and shortness of breath.   Cardiovascular: Negative for chest pain and leg swelling.  Endocrine: Positive for cold intolerance.  Musculoskeletal: Positive for arthralgias.  Allergic/Immunologic: Positive for environmental allergies.    Past Medical History:  Diagnosis Date  . Anemia   . Arthritis   . Blood dyscrasia    sickle cell trait  . Carbuncle and furuncle   . Hypercholesterolemia   . Hypertension    does not take meds  . Sickle cell trait [facility])     Past Surgical History:  Procedure Laterality Date  . APPENDECTOMY    . CARPAL TUNNEL RELEASE  03/23/2011   Procedure: CARPAL TUNNEL RELEASE;  Surgeon: Sanjuana Kava;  Location: [facility];  Service: Orthopedics;  Laterality: Left;  . CARPAL TUNNEL RELEASE  05/10/2011   Procedure: CARPAL TUNNEL RELEASE;  Surgeon: Sanjuana Kava, MD;  Location: [facility];  Service: Orthopedics;  Laterality: Right;  . CESAREAN SECTION     x 2  . INCISION AND DRAINAGE PERIRECTAL ABSCESS  12/21/09  . PROCTOSCOPY  10/17/2010   Procedure: PROCTOSCOPY;  Surgeon: Scherry Ran;  Location: [facility];  Service: General;  Laterality: N/A;  Rigid Proctoscopy/Possible Fistula in Ano  Procedure ended at 1003  . THERAPEUTIC ABORTION     x2    Family History  Problem Relation Age of Onset  . Kidney failure Daughter   . Sickle cell  anemia Daughter   . Sickle cell trait Daughter   . Sickle cell anemia Son   . Hypertension Mother   . Hyperlipidemia Mother   . Hypertension Father   . Lupus Father        skin  . Hypertension Other   . Sarcoidosis Sister   . Anesthesia problems Neg Hx   . Hypotension Neg Hx   . Malignant hyperthermia Neg Hx   . Pseudochol deficiency Neg Hx     Social History Social History  Substance Use Topics  . Smoking status: Current Every Day Smoker    Packs/day: 0.50    Years: 30.00    Types: Cigarettes  . Smokeless tobacco: Never Used  . Alcohol use No      Current Outpatient Prescriptions  Medication Sig Dispense Refill  . atorvastatin (LIPITOR) 20 MG tablet Take 1 tablet (20 mg total) by mouth daily. 90 tablet 2  . benzonatate (TESSALON) 100 MG capsule Take 1 capsule (100 mg total) by mouth every 8 (eight) hours. 21 capsule 0  . clindamycin (CLEOCIN) 300 MG capsule Take 1 capsule (300 mg total) by mouth 3 (three) times daily. 30 capsule 0  . cyclobenzaprine (FLEXERIL) 10 MG tablet Take 1 tablet (10 mg total) by mouth 3 (three) times daily as needed for muscle spasms. [place]  tablet 0  . doxycycline (VIBRAMYCIN) 100 MG capsule Take 1 capsule (100 mg total) by mouth 2 (two) times daily. 20 capsule 0  . ferrous sulfate 325 (65 FE) MG tablet Take 325 mg by mouth daily with breakfast.    . HYDROcodone-acetaminophen (NORCO) 7.5-325 MG tablet Take 1 tablet by mouth every 6 (six) hours as needed for moderate pain (Must last 30 days.Do not drive a call or operate machinery while on this medicine.). 90 tablet 0  . lisinopril-hydrochlorothiazide (PRINZIDE,ZESTORETIC) 20-12.5 MG tablet Take 1 tablet by mouth daily.    . metroNIDAZOLE (FLAGYL) 500 MG tablet Take 1 tablet (500 mg total) by mouth 2 (two) times daily. 14 tablet 0  . Multiple Vitamin (MULTIVITAMIN WITH MINERALS) TABS tablet Take 1 tablet by mouth daily.    . nabumetone (RELAFEN) 750 MG tablet Take 1 tablet (750 mg total) by mouth 2  (two) times daily. One by mouth twice a day after eating. 60 tablet 5  . predniSONE (STERAPRED UNI-PAK 21 TAB) 5 MG (21) TBPK tablet Take 6 pills first day; 5 pills second day; 4 pills third day; 3 pills fourth day; 2 pills next day and 1 pill last day. 21 tablet 0  . silver sulfADIAZINE (SILVADENE) 1 % cream Apply 1 application topically 2 (two) times daily. 50 g 0  . sulfamethoxazole-trimethoprim (BACTRIM DS,SEPTRA DS) 800-160 MG tablet Take 1 tablet by mouth 2 (two) times daily. 28 tablet 1   No current facility-administered medications for this visit.      Physical Exam  Blood pressure 101/62, pulse 89, temperature 97.8 F (36.6 C), height 5\' 7"  (1.702 m), weight 172 lb (78 kg), last menstrual period 08/12/2010.  Constitutional: overall normal hygiene, normal nutrition, well developed, normal grooming, normal body habitus. Assistive device:none  Musculoskeletal: gait and station Limp none, muscle tone and strength are normal, no tremors or atrophy is present.  .  Neurological: coordination overall normal.  Deep tendon reflex/nerve stretch intact.  Sensation normal.  Cranial nerves II-XII intact.   Skin:   Normal overall no scars, lesions, ulcers or rashes. No psoriasis.  Psychiatric: Alert and oriented x 3.  Recent memory intact, remote memory unclear.  Normal mood and affect. Well groomed.  Good eye contact.  Cardiovascular: overall no swelling, no varicosities, no edema bilaterally, normal temperatures of the legs and arms, no clubbing, cyanosis and good capillary refill.  Lymphatic: palpation is normal.  All other systems reviewed and are negative   Hands have no swelling, no numbness.  She has full ROM.  Grips are normal.  The patient has been educated about the nature of the problem(s) and counseled on treatment options.  The patient appeared to understand what I have discussed and is in agreement with it.  Encounter Diagnoses  Name Primary?  . Pain of left hand Yes  .  Hand pain, right   . Essential hypertension, benign   . Cigarette nicotine dependence without complication     PLAN Call if any problems.  Precautions discussed.  Continue current medications.   Return to clinic 1 week  Go over labs.   Electronically Signed Sanjuana Kava, MD 10/17/20183:03 PM

## 2017-01-16 NOTE — Patient Instructions (Addendum)
Steps to Quit Smoking Smoking tobacco can be bad for your health. It can also affect almost every organ in your body. Smoking puts you and people around you at risk for many serious Amber Drake-lasting (chronic) diseases. Quitting smoking is hard, but it is one of the best things that you can do for your health. It is never too late to quit. What are the benefits of quitting smoking? When you quit smoking, you lower your risk for getting serious diseases and conditions. They can include:  Lung cancer or lung disease.  Heart disease.  Stroke.  Heart attack.  Not being able to have children (infertility).  Weak bones (osteoporosis) and broken bones (fractures).  If you have coughing, wheezing, and shortness of breath, those symptoms may get better when you quit. You may also get sick less often. If you are pregnant, quitting smoking can help to lower your chances of having a baby of low birth weight. What can I do to help me quit smoking? Talk with your doctor about what can help you quit smoking. Some things you can do (strategies) include:  Quitting smoking totally, instead of slowly cutting back how much you smoke over a period of time.  Going to in-person counseling. You are more likely to quit if you go to many counseling sessions.  Using resources and support systems, such as: ? Online chats with a counselor. ? Phone quitlines. ? Printed self-help materials. ? Support groups or group counseling. ? Text messaging programs. ? Mobile phone apps or applications.  Taking medicines. Some of these medicines may have nicotine in them. If you are pregnant or breastfeeding, do not take any medicines to quit smoking unless your doctor says it is okay. Talk with your doctor about counseling or other things that can help you.  Talk with your doctor about using more than one strategy at the same time, such as taking medicines while you are also going to in-person counseling. This can help make  quitting easier. What things can I do to make it easier to quit? Quitting smoking might feel very hard at first, but there is a lot that you can do to make it easier. Take these steps:  Talk to your family and friends. Ask them to support and encourage you.  Call phone quitlines, reach out to support groups, or work with a counselor.  Ask people who smoke to not smoke around you.  Avoid places that make you want (trigger) to smoke, such as: ? Bars. ? Parties. ? Smoke-break areas at work.  Spend time with people who do not smoke.  Lower the stress in your life. Stress can make you want to smoke. Try these things to help your stress: ? Getting regular exercise. ? Deep-breathing exercises. ? Yoga. ? Meditating. ? Doing a body scan. To do this, close your eyes, focus on one area of your body at a time from head to toe, and notice which parts of your body are tense. Try to relax the muscles in those areas.  Download or buy apps on your mobile phone or tablet that can help you stick to your quit plan. There are many free apps, such as QuitGuide from the CDC (Centers for Disease Control and Prevention). You can find more support from smokefree.gov and other websites.  This information is not intended to replace advice given to you by your health care provider. Make sure you discuss any questions you have with your health care provider. Document Released: 01/13/2009 Document   Revised: 11/15/2015 Document Reviewed: 08/03/2014 Elsevier Interactive Patient Education  2018 Elsevier Inc.  

## 2017-01-23 ENCOUNTER — Ambulatory Visit: Payer: BLUE CROSS/BLUE SHIELD | Admitting: Orthopaedic Surgery

## 2017-01-30 DIAGNOSIS — M79642 Pain in left hand: Secondary | ICD-10-CM | POA: Diagnosis not present

## 2017-01-31 LAB — ANA: Anti Nuclear Antibody(ANA): POSITIVE — AB

## 2017-01-31 LAB — RHEUMATOID FACTOR: Rhuematoid fact SerPl-aCnc: <14 IU/mL (ref <14)

## 2017-01-31 LAB — SEDIMENTATION RATE: Sed Rate: 45 mm/h — ABNORMAL HIGH (ref 0–30)

## 2017-01-31 LAB — ANTI-NUCLEAR AB-TITER (ANA TITER): ANA Titer 1: 1:80 {titer} — ABNORMAL HIGH

## 2017-02-06 ENCOUNTER — Other Ambulatory Visit (HOSPITAL_COMMUNITY): Payer: Self-pay | Admitting: Internal Medicine

## 2017-02-06 DIAGNOSIS — Z1231 Encounter for screening mammogram for malignant neoplasm of breast: Secondary | ICD-10-CM

## 2017-02-13 ENCOUNTER — Encounter: Payer: Self-pay | Admitting: Orthopaedic Surgery

## 2017-02-13 ENCOUNTER — Ambulatory Visit (INDEPENDENT_AMBULATORY_CARE_PROVIDER_SITE_OTHER): Payer: BLUE CROSS/BLUE SHIELD | Admitting: Orthopaedic Surgery

## 2017-02-13 VITALS — BP 133/79 | HR 83 | Temp 98.4°F | Ht 67.0 in | Wt 177.0 lb

## 2017-02-13 DIAGNOSIS — M79641 Pain in right hand: Secondary | ICD-10-CM | POA: Diagnosis not present

## 2017-02-13 DIAGNOSIS — M79642 Pain in left hand: Secondary | ICD-10-CM | POA: Diagnosis not present

## 2017-02-13 DIAGNOSIS — R768 Other specified abnormal immunological findings in serum: Secondary | ICD-10-CM

## 2017-02-13 MED ORDER — HYDROCODONE-ACETAMINOPHEN 7.5-325 MG PO TABS: 1.0000 | ORAL_TABLET | Freq: Four times a day (QID) | ORAL | 0 refills | Status: DC | PRN

## 2017-02-13 NOTE — Patient Instructions (Signed)

## 2017-02-13 NOTE — Progress Notes (Signed)
Patient AL:PFXTKW Amber Drake, female DOB:12/27/1960, 56 y.o. IOX:[id number]  Chief Complaint  Patient presents with  . Follow-up    Right wrist and hand pain  Lab results review    HPI  Amber Drake is a 56 y.o. female who has continued pain in her hands with stiffness more in the early morning and on cold days.  She gets a little better as the day goes on and has pain later at night.  She has a brother with rheumatoid arthritis, a twin sister with Sarcoidosis, and her father had lupus.  She had labs done recently which shows sed rate of 45 (normal to 30), positive ANA, ANA Pattern 1 speckled and ANA titer of 1:80 (elevated).  RA factor was negative.  I would like for her to be seen by rheumatologist.  She is agreeable to this. HPI  Body mass index is 27.72 kg/m.  ROS  Review of Systems  HENT: Negative for congestion.   Respiratory: Negative for cough and shortness of breath.   Cardiovascular: Negative for chest pain and leg swelling.  Endocrine: Positive for cold intolerance.  Musculoskeletal: Positive for arthralgias.  Allergic/Immunologic: Positive for environmental allergies.    Past Medical History:  Diagnosis Date  . Anemia   . Arthritis   . Blood dyscrasia    sickle cell trait  . Carbuncle and furuncle   . Hypercholesterolemia   . Hypertension    does not take meds  . Sickle cell trait [facility])     Past Surgical History:  Procedure Laterality Date  . APPENDECTOMY    . CESAREAN SECTION     x 2  . INCISION AND DRAINAGE PERIRECTAL ABSCESS  12/21/09  . PROCTOSCOPY  10/17/2010   Procedure: PROCTOSCOPY;  Surgeon: Scherry Ran;  Location: [facility];  Service: General;  Laterality: N/A;  Rigid Proctoscopy/Possible Fistula in Ano  Procedure ended at 1003  . THERAPEUTIC ABORTION     x2    Family History  Problem Relation Age of Onset  . Kidney failure Daughter   . Sickle cell anemia Daughter   . Sickle cell trait Daughter   . Sickle cell anemia Son   .  Hypertension Mother   . Hyperlipidemia Mother   . Hypertension Father   . Lupus Father        skin  . Hypertension Other   . Sarcoidosis Sister   . Anesthesia problems Neg Hx   . Hypotension Neg Hx   . Malignant hyperthermia Neg Hx   . Pseudochol deficiency Neg Hx     Social History Social History   Tobacco Use  . Smoking status: Current Every Day Smoker    Packs/day: 0.50    Years: 30.00    Pack years: 15.00    Types: Cigarettes  . Smokeless tobacco: Never Used  Substance Use Topics  . Alcohol use: No  . 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 atorvastatin (LIPITOR) 20 MG tablet Take 1 tablet (20 mg total) by mouth daily. 90 tablet 2  . benzonatate (TESSALON) 100 MG capsule Take 1 capsule (100 mg total) by mouth every 8 (eight) hours. 21 capsule 0  . clindamycin (CLEOCIN) 300 MG capsule Take 1 capsule (300 mg total) by mouth 3 (three) times daily. 30 capsule 0  . cyclobenzaprine (FLEXERIL) 10 MG tablet Take 1 tablet (10 mg total) by mouth 3 (three) times  daily as needed for muscle spasms. 40 tablet 0  . doxycycline (VIBRAMYCIN) 100 MG capsule Take 1 capsule (100 mg total) by mouth 2 (two) times daily. 20 capsule 0  . ferrous sulfate 325 (65 FE) MG tablet Take 325 mg by mouth daily with breakfast.    . HYDROcodone-acetaminophen (NORCO) 7.5-325 MG tablet Take 1 tablet by mouth every 6 (six) hours as needed for moderate pain (Must last 30 days.Do not drive a call or operate machinery while on this medicine.). 90 tablet 0  . lisinopril-hydrochlorothiazide (PRINZIDE,ZESTORETIC) 20-12.5 MG tablet Take 1 tablet by mouth daily.    . metroNIDAZOLE (FLAGYL) 500 MG tablet Take 1 tablet (500 mg total) by mouth 2 (two) times daily. 14 tablet 0  . Multiple Vitamin (MULTIVITAMIN WITH MINERALS) TABS tablet Take 1 tablet by mouth daily.    . nabumetone (RELAFEN) 750 MG tablet Take 1 tablet (750 mg total) by  mouth 2 (two) times daily. One by mouth twice a day after eating. 60 tablet 5  . predniSONE (STERAPRED UNI-PAK 21 TAB) 5 MG (21) TBPK tablet Take 6 pills first day; 5 pills second day; 4 pills third day; 3 pills fourth day; 2 pills next day and 1 pill last day. 21 tablet 0  . silver sulfADIAZINE (SILVADENE) 1 % cream Apply 1 application topically 2 (two) times daily. 50 g 0  . sulfamethoxazole-trimethoprim (BACTRIM DS,SEPTRA DS) 800-160 MG tablet Take 1 tablet by mouth 2 (two) times daily. 28 tablet 1   No current facility-administered medications for this visit.      Physical Exam  Blood pressure 133/79, pulse 83, temperature 98.4 F (36.9 C), height 5\' 7"  (1.702 m), weight 177 lb (80.3 kg), last menstrual period 08/12/2010.  Constitutional: overall normal hygiene, normal nutrition, well developed, normal grooming, normal body habitus. Assistive device:none  Musculoskeletal: gait and station Limp none, muscle tone and strength are normal, no tremors or atrophy is present.  .  Neurological: coordination overall normal.  Deep tendon reflex/nerve stretch intact.  Sensation normal.  Cranial nerves II-XII intact.   Skin:   Normal overall no scars, lesions, ulcers or rashes. No psoriasis.  Psychiatric: Alert and oriented x 3.  Recent memory intact, remote memory unclear.  Normal mood and affect. Well groomed.  Good eye contact.  Cardiovascular: overall no swelling, no varicosities, no edema bilaterally, normal temperatures of the legs and arms, no clubbing, cyanosis and good capillary refill.  Lymphatic: palpation is normal.  All other systems reviewed and are negative   Her hands are not swollen or red today.  She has good ROM and normal grips.  They are not tender.  NV intact.  The patient has been educated about the nature of the problem(s) and counseled on treatment options.  The patient appeared to understand what I have discussed and is in agreement with it.  Encounter Diagnoses   Name Primary?  . Hand pain, right Yes  . Pain of left hand   . Positive ANA (antinuclear antibody)     PLAN Call if any problems.  Precautions discussed.  Continue current medications.   Return to clinic 3 months   I have reviewed the [place] web site prior to prescribing narcotic medicine for this patient.  Electronically Signed Sanjuana Kava, MD 11/14/20182:57 PM

## 2017-02-15 ENCOUNTER — Ambulatory Visit (HOSPITAL_COMMUNITY)
Admission: RE | Admit: 2017-02-15 | Discharge: 2017-02-15 | Disposition: A | Discharge status: Home / Self Care | Payer: BLUE CROSS/BLUE SHIELD | Source: Ambulatory Visit | Attending: Internal Medicine | Admitting: Internal Medicine

## 2017-02-15 DIAGNOSIS — Z1231 Encounter for screening mammogram for malignant neoplasm of breast: Secondary | ICD-10-CM | POA: Diagnosis not present

## 2017-02-15 IMAGING — MG DIGITAL SCREENING BILATERAL MAMMOGRAM WITH CAD
4 series · 4 of 4 positions shown · non-contrast
Comparison: Previous exam(s).

CLINICAL DATA: Screening.

EXAM:
DIGITAL SCREENING BILATERAL MAMMOGRAM WITH CAD

[L MLO]
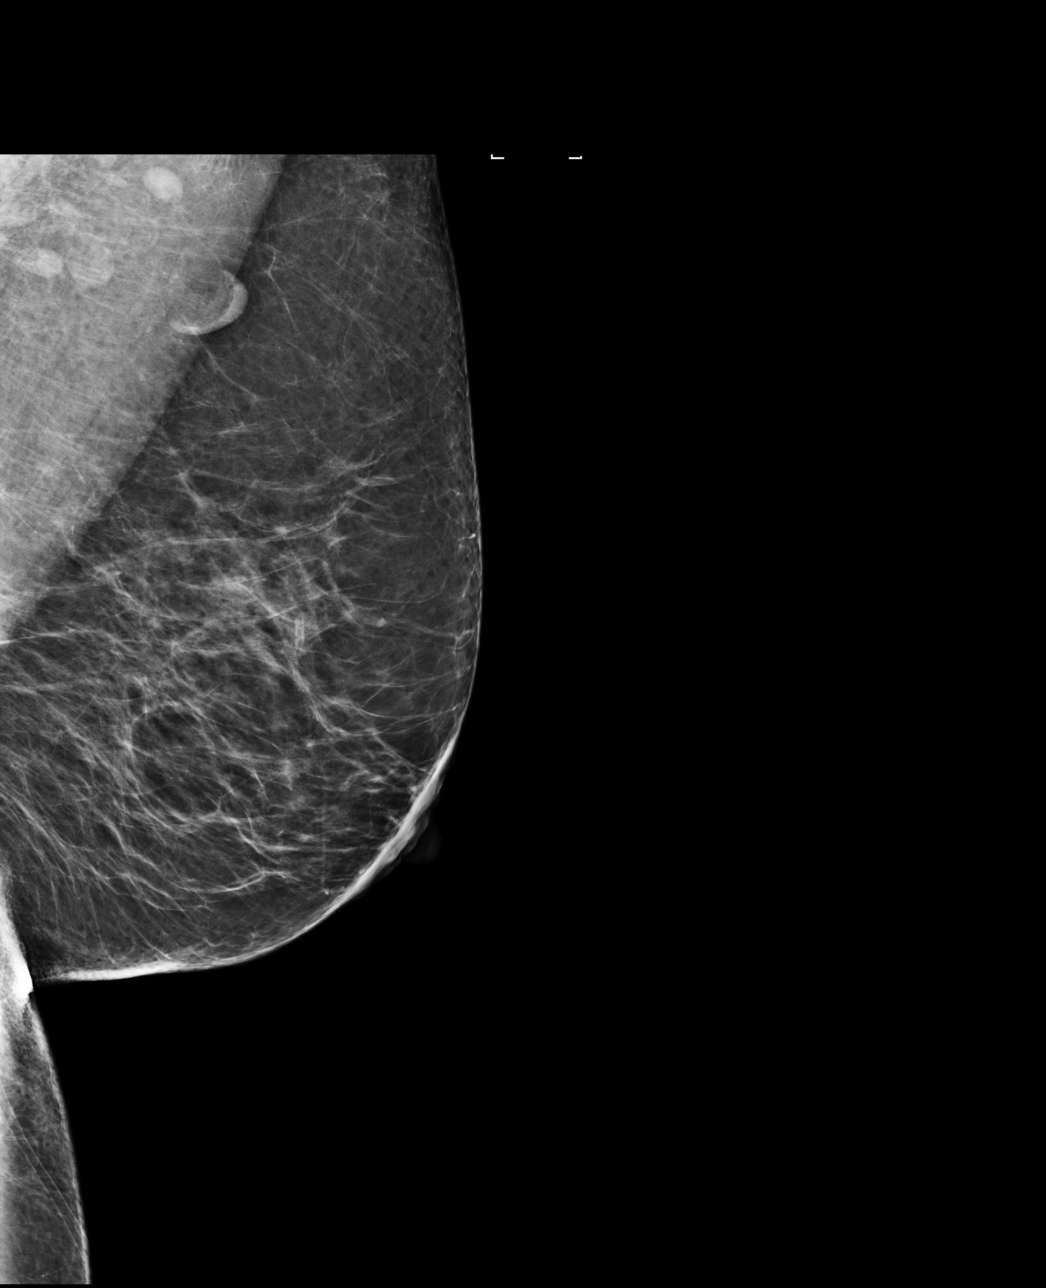

[R CC]
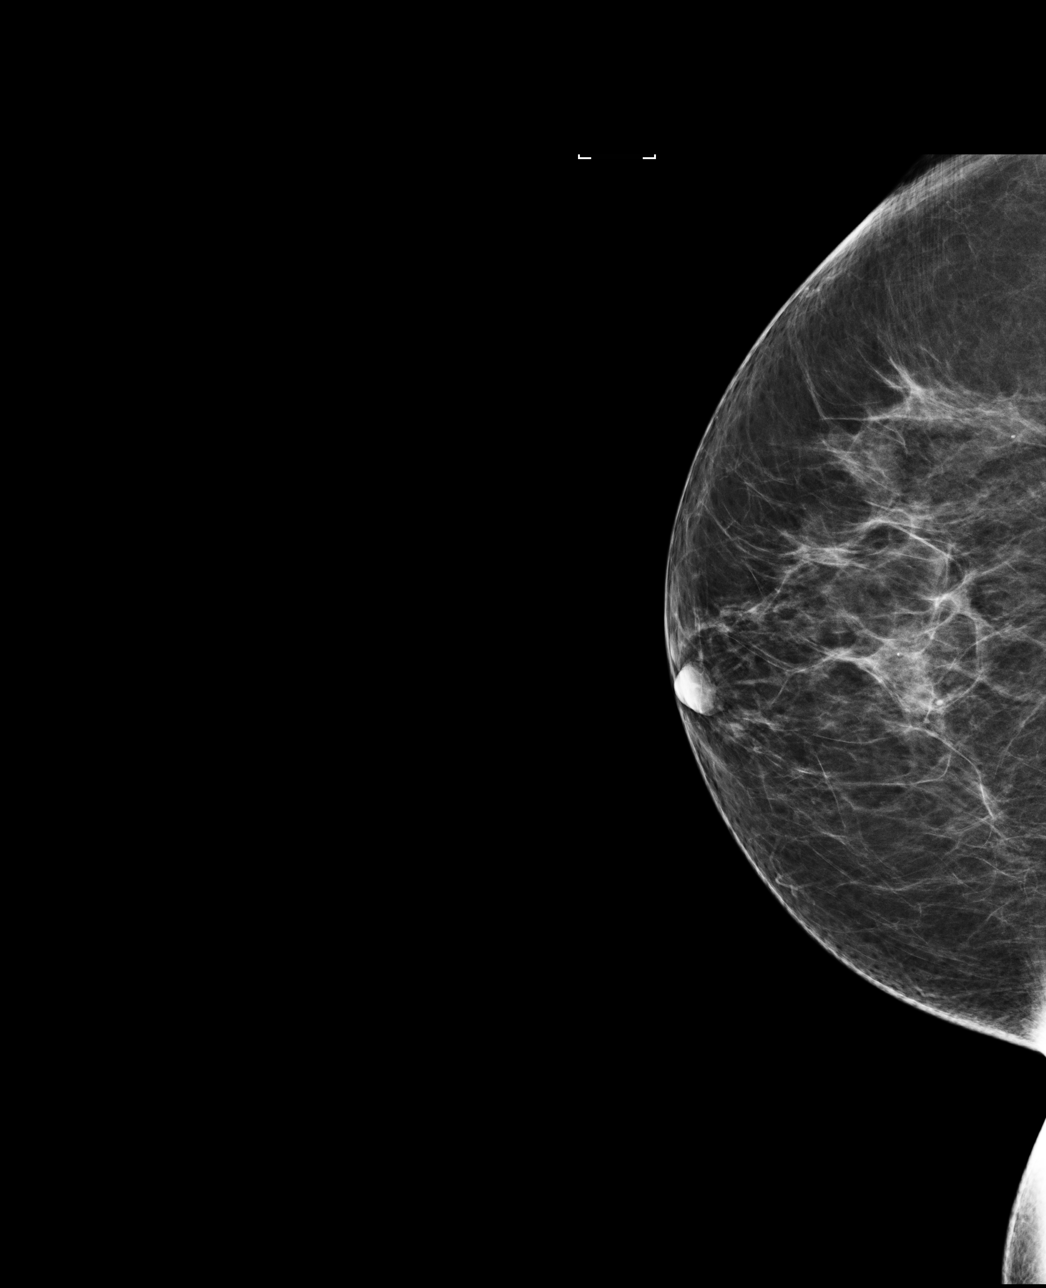

[R MLO]
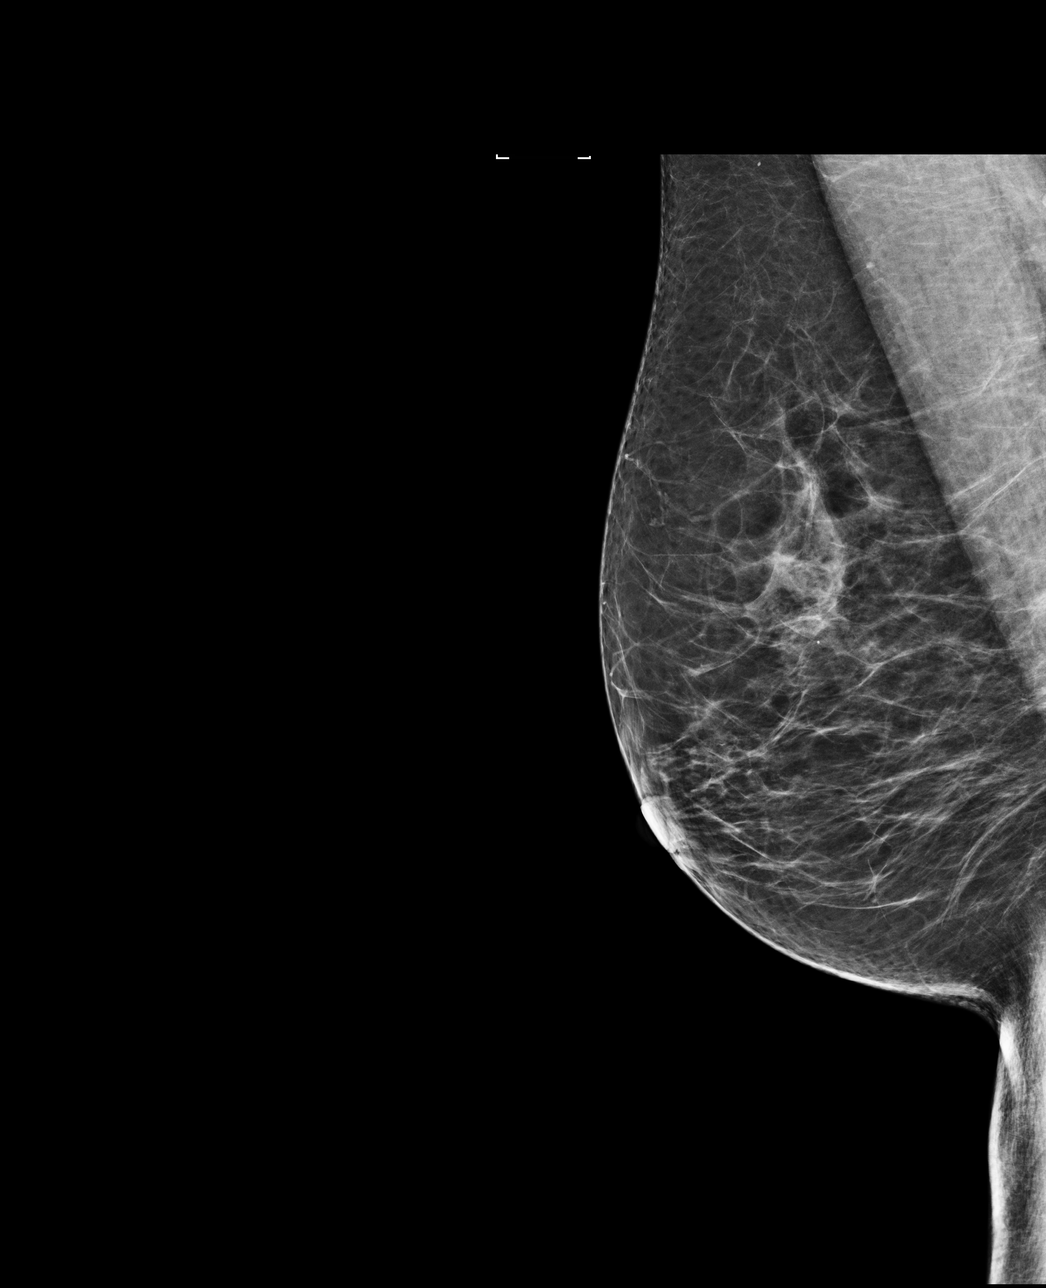

[L CC]
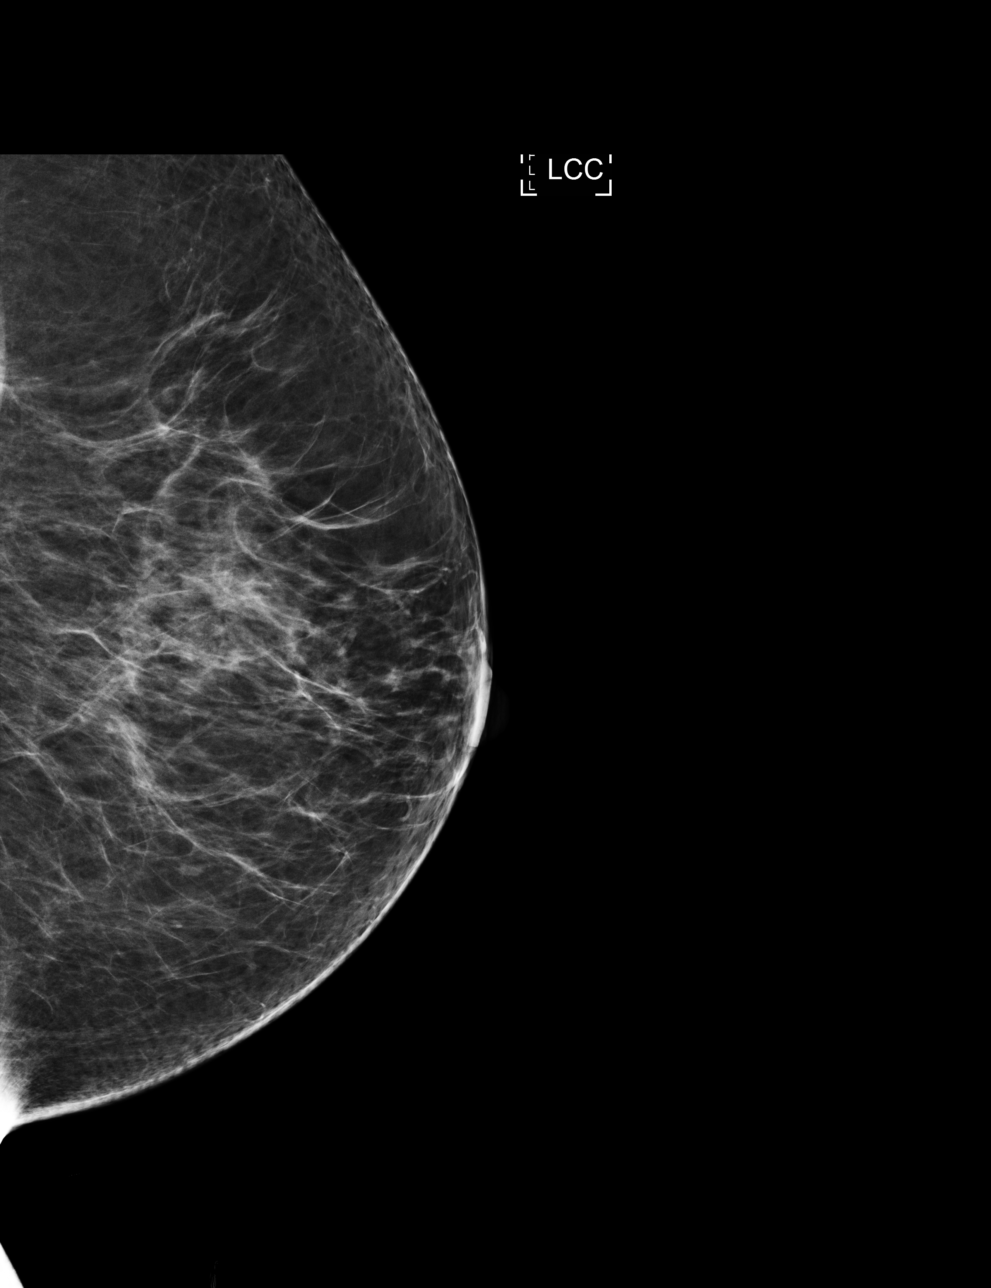

[4 of 4 positions shown; findings below may reference images not displayed]

ACR Breast Density Category b: There are scattered areas of
fibroglandular density.
FINDINGS: There are no findings suspicious for malignancy. Images were
processed with CAD.
IMPRESSION: No mammographic evidence of malignancy. A result letter of this
screening mammogram will be mailed directly to the patient.

RECOMMENDATION:
Screening mammogram in one year. (Code:[US])

BI-RADS CATEGORY  1: Negative.

## 2017-02-25 DIAGNOSIS — J209 Acute bronchitis, unspecified: Secondary | ICD-10-CM | POA: Diagnosis not present

## 2017-03-18 ENCOUNTER — Telehealth: Payer: Self-pay | Admitting: Orthopaedic Surgery

## 2017-03-18 MED ORDER — HYDROCODONE-ACETAMINOPHEN 7.5-325 MG PO TABS: 1.0000 | ORAL_TABLET | Freq: Four times a day (QID) | ORAL | 0 refills | Status: DC | PRN

## 2017-03-18 NOTE — Telephone Encounter (Signed)
Hydrocodone-Acetaminophen  7.5/325 mg  Qty 90 Tablets    Patient states she uses [employer].

## 2017-03-25 ENCOUNTER — Other Ambulatory Visit: Payer: Self-pay

## 2017-03-25 ENCOUNTER — Encounter (HOSPITAL_COMMUNITY): Payer: Self-pay | Admitting: Emergency Medicine

## 2017-03-25 ENCOUNTER — Emergency Department (HOSPITAL_COMMUNITY)
Admission: EM | Admit: 2017-03-25 | Discharge: 2017-03-25 | Disposition: A | Discharge status: Home / Self Care | Payer: BLUE CROSS/BLUE SHIELD | Attending: Emergency Medicine | Admitting: Emergency Medicine

## 2017-03-25 DIAGNOSIS — R070 Pain in throat: Secondary | ICD-10-CM | POA: Diagnosis not present

## 2017-03-25 DIAGNOSIS — I10 Essential (primary) hypertension: Secondary | ICD-10-CM | POA: Insufficient documentation

## 2017-03-25 DIAGNOSIS — Z79899 Other long term (current) drug therapy: Secondary | ICD-10-CM | POA: Insufficient documentation

## 2017-03-25 DIAGNOSIS — F17210 Nicotine dependence, cigarettes, uncomplicated: Secondary | ICD-10-CM | POA: Diagnosis not present

## 2017-03-25 DIAGNOSIS — J029 Acute pharyngitis, unspecified: Secondary | ICD-10-CM | POA: Diagnosis not present

## 2017-03-25 LAB — RAPID STREP SCREEN (MED CTR MEBANE ONLY): STREPTOCOCCUS, GROUP A SCREEN (DIRECT): NEGATIVE

## 2017-03-25 NOTE — ED Provider Notes (Signed)
[facility] EMERGENCY DEPARTMENT Provider Note   CSN: [id number] Arrival date & time: 03/25/17  0740     History   Chief Complaint Chief Complaint  Patient presents with  . Sore Throat    HPI Amber Drake is a 56 y.o. female.  HPI  Pt was seen at [place].  Per pt, c/o gradual onset and persistence of constant sore throat that began last night.  Denies fevers, no rash, no CP/SOB, no N/V/D, no abd pain.    Past Medical History:  Diagnosis Date  . Anemia   . Arthritis   . Blood dyscrasia    sickle cell trait  . Carbuncle and furuncle   . Hypercholesterolemia   . Hypertension    does not take meds  . Sickle cell trait [facility])     Patient Active Problem List   Diagnosis Date Noted  . Hidradenitis 07/31/2016  . Recurrent boils 07/31/2016  . Hyperlipidemia 06/15/2015  . Pain of left hand 05/19/2015  . Hand pain, right 05/19/2015  . Hematuria 03/03/2015  . Esophageal reflux 03/03/2015  . Essential hypertension, benign 03/03/2015  . Bronchitis due to tobacco use ([place]) 10/17/2010    Past Surgical History:  Procedure Laterality Date  . APPENDECTOMY    . CARPAL TUNNEL RELEASE  03/23/2011   Procedure: CARPAL TUNNEL RELEASE;  Surgeon: Sanjuana Kava;  Location: [facility];  Service: Orthopedics;  Laterality: Left;  . CARPAL TUNNEL RELEASE  05/10/2011   Procedure: CARPAL TUNNEL RELEASE;  Surgeon: Sanjuana Kava, MD;  Location: [facility];  Service: Orthopedics;  Laterality: Right;  . CESAREAN SECTION     x 2  . INCISION AND DRAINAGE PERIRECTAL ABSCESS  12/21/09  . PROCTOSCOPY  10/17/2010   Procedure: PROCTOSCOPY;  Surgeon: Scherry Ran;  Location: [facility];  Service: General;  Laterality: N/A;  Rigid Proctoscopy/Possible Fistula in Ano  Procedure ended at 1003  . THERAPEUTIC ABORTION     x2    OB History    Gravida Para Term Preterm AB Living   4 2 2   2 2    SAB TAB Ectopic Multiple Live Births                   Home Medications    Prior to Admission medications     Medication Sig Start Date End Date Taking? Authorizing Provider  atorvastatin (LIPITOR) 20 MG tablet Take 1 tablet (20 mg total) by mouth daily. 03/14/15   Soyla Dryer, PA-C  benzonatate (TESSALON) 100 MG capsule Take 1 capsule (100 mg total) by mouth every 8 (eight) hours. 12/21/16   Emeline General, PA-C  clindamycin (CLEOCIN) 300 MG capsule Take 1 capsule (300 mg total) by mouth 3 (three) times daily. 11/15/16   Ashley Murrain, NP  cyclobenzaprine (FLEXERIL) 10 MG tablet Take 1 tablet (10 mg total) by mouth 3 (three) times daily as needed for muscle spasms. 11/07/16   Sanjuana Kava, MD  doxycycline (VIBRAMYCIN) 100 MG capsule Take 1 capsule (100 mg total) by mouth 2 (two) times daily. 01/12/17   Noemi Chapel, MD  ferrous sulfate 325 (65 FE) MG tablet Take 325 mg by mouth daily with breakfast.    [provider]  HYDROcodone-acetaminophen (NORCO) 7.5-325 MG tablet Take 1 tablet by mouth every 6 (six) hours as needed for moderate pain (Must last 30 days.Do not drive a call or operate machinery while on this medicine.). 03/18/17   Sanjuana Kava, MD  lisinopril-hydrochlorothiazide (PRINZIDE,ZESTORETIC) 20-12.5 MG tablet Take 1 tablet  by mouth daily.    [provider]  metroNIDAZOLE (FLAGYL) 500 MG tablet Take 1 tablet (500 mg total) by mouth 2 (two) times daily. 01/12/17   Noemi Chapel, MD  Multiple Vitamin (MULTIVITAMIN WITH MINERALS) TABS tablet Take 1 tablet by mouth daily.    [provider]  nabumetone (RELAFEN) 750 MG tablet Take 1 tablet (750 mg total) by mouth 2 (two) times daily. One by mouth twice a day after eating. 11/07/16   Sanjuana Kava, MD  predniSONE (STERAPRED UNI-PAK 21 TAB) 5 MG (21) TBPK tablet Take 6 pills first day; 5 pills second day; 4 pills third day; 3 pills fourth day; 2 pills next day and 1 pill last day. 10/23/16   Sanjuana Kava, MD  silver sulfADIAZINE (SILVADENE) 1 % cream Apply 1 application topically 2 (two) times daily. 07/31/16    Estill Dooms, NP  sulfamethoxazole-trimethoprim (BACTRIM DS,SEPTRA DS) 800-160 MG tablet Take 1 tablet by mouth 2 (two) times daily. 09/19/16   Estill Dooms, NP    Family History Family History  Problem Relation Age of Onset  . Kidney failure Daughter   . Sickle cell anemia Daughter   . Sickle cell trait Daughter   . Sickle cell anemia Son   . Hypertension Mother   . Hyperlipidemia Mother   . Hypertension Father   . Lupus Father        skin  . Hypertension Other   . Sarcoidosis Sister   . Anesthesia problems Neg Hx   . Hypotension Neg Hx   . Malignant hyperthermia Neg Hx   . Pseudochol deficiency Neg Hx     Social History Social History   Tobacco Use  . Smoking status: Current Every Day Smoker    Packs/day: 0.50    Years: 30.00    Pack years: 15.00    Types: Cigarettes  . Smokeless tobacco: Never Used  Substance Use Topics  . Alcohol use: No  . 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 ; ; Eyes: Negative for eye pain, redness and discharge. ; ; ENMT: Negative for ear pain, hoarseness, nasal congestion, sinus pressure and +sore throat. ; ; Cardiovascular: Negative for chest pain, palpitations, diaphoresis, dyspnea and peripheral edema. ; ; Respiratory: Negative for cough, wheezing and stridor. ; ; Gastrointestinal: Negative for nausea, vomiting, diarrhea, abdominal pain, blood in stool, hematemesis, jaundice and rectal bleeding. . ; ; Genitourinary: Negative for dysuria, flank pain and hematuria. ; ; Musculoskeletal: Negative for back pain and neck pain. Negative for swelling and trauma.; ; Skin: Negative for pruritus, rash, abrasions, blisters, bruising and skin lesion.; ; Neuro: Negative for headache, lightheadedness and neck  stiffness. Negative for weakness, altered level of consciousness, altered mental status, extremity weakness, paresthesias, involuntary movement, seizure and syncope.       Physical Exam Updated Vital Signs BP 133/78 (BP Location: Left Arm)   Pulse 82   Temp 98.7 F (37.1 C) (Oral)   Resp 18   Ht 5\' 9"  (1.753 m)   Wt 78.6 kg (173 lb 4.8 oz)   LMP 08/12/2010   SpO2 100%   BMI 25.59 kg/m   Physical Exam 0845; Physical examination:  Nursing notes reviewed; Vital signs and O2 SAT reviewed;  Constitutional:  Well developed, Well nourished, Well hydrated, In no acute distress; Head:  Normocephalic, atraumatic; Eyes: EOMI, PERRL, No scleral icterus; ENMT: TM's clear bilat. +edemetous nasal turbinates bilat with clear rhinorrhea. Mouth and pharynx without lesions. No tonsillar exudates. No intra-oral edema. No submandibular or sublingual edema. No hoarse voice, no drooling, no stridor. No pain with manipulation of larynx. No trismus.  Mouth and pharynx normal, Mucous membranes moist; Neck: Supple, Full range of motion, No lymphadenopathy; Cardiovascular: Regular rate and rhythm, No gallop; Respiratory: Breath sounds clear & equal bilaterally, No wheezes.  Speaking full sentences with ease, Normal respiratory effort/excursion; Chest: Nontender, Movement normal; Abdomen: Soft, Nontender, Nondistended, Normal bowel sounds; Genitourinary: No CVA tenderness; Extremities: Pulses normal, No tenderness, No edema, No calf edema or asymmetry.; Neuro: AA&Ox3, Major CN grossly intact.  Speech clear. No gross focal motor or sensory deficits in extremities. Climbs on and off stretcher easily by herself. Gait steady..; Skin: Color normal, Warm, Dry.   ED Treatments / Results  Labs (all labs ordered are listed, but only abnormal results are displayed)   EKG  EKG Interpretation None       Radiology   Procedures Procedures (including critical care time)  Medications Ordered in ED Medications - No data  to display   Initial Impression / Assessment and Plan / ED Course  I have reviewed the triage vital signs and the nursing notes.  Pertinent labs & imaging results that were available during my care of the patient were reviewed by me and considered in my medical decision making (see chart for details).  MDM Reviewed: previous chart, nursing note and vitals Interpretation: labs   Results for orders placed or performed during the hospital encounter of 03/25/17  Rapid strep screen  Result Value Ref Range   Streptococcus, Group A Screen (Direct) NEGATIVE NEGATIVE    0915:  Strep negative. Tx symptomatically at this time. Dx and testing d/w pt.  Questions answered.  Verb understanding, agreeable to d/c home with outpt f/u.    Final Clinical Impressions(s) / ED Diagnoses   Final diagnoses:  None    ED Discharge Orders    None       Francine Graven, DO 03/27/17 1908

## 2017-03-25 NOTE — ED Triage Notes (Signed)
Pt reports sore throat since last night. Pt denies any productive cough,fever. nad noted.

## 2017-03-25 NOTE — Discharge Instructions (Signed)
Take over the counter tylenol and ibuprofen, as directed on packaging, as needed for discomfort.  Gargle with warm water several times per day to help with discomfort.  May also use over the counter sore throat pain medicines such as chloraseptic or sucrets, as directed on packaging, as needed for discomfort.  Call your regular medical doctor on Wednesday to schedule a follow up appointment this week.  Return to the Emergency Department immediately if worsening.

## 2017-03-27 LAB — CULTURE, GROUP A STREP (THRC)

## 2017-04-14 ENCOUNTER — Encounter (HOSPITAL_COMMUNITY): Payer: Self-pay

## 2017-04-14 ENCOUNTER — Emergency Department (HOSPITAL_COMMUNITY)
Admission: EM | Admit: 2017-04-14 | Discharge: 2017-04-14 | Disposition: A | Discharge status: Home / Self Care | Payer: BLUE CROSS/BLUE SHIELD | Attending: Emergency Medicine | Admitting: Emergency Medicine

## 2017-04-14 ENCOUNTER — Other Ambulatory Visit: Payer: Self-pay

## 2017-04-14 DIAGNOSIS — M542 Cervicalgia: Secondary | ICD-10-CM | POA: Insufficient documentation

## 2017-04-14 DIAGNOSIS — R42 Dizziness and giddiness: Secondary | ICD-10-CM | POA: Insufficient documentation

## 2017-04-14 DIAGNOSIS — Z79899 Other long term (current) drug therapy: Secondary | ICD-10-CM | POA: Diagnosis not present

## 2017-04-14 DIAGNOSIS — F17210 Nicotine dependence, cigarettes, uncomplicated: Secondary | ICD-10-CM | POA: Insufficient documentation

## 2017-04-14 DIAGNOSIS — R519 Headache, unspecified: Secondary | ICD-10-CM

## 2017-04-14 DIAGNOSIS — I10 Essential (primary) hypertension: Secondary | ICD-10-CM | POA: Diagnosis not present

## 2017-04-14 DIAGNOSIS — R51 Headache: Secondary | ICD-10-CM | POA: Insufficient documentation

## 2017-04-14 MED ORDER — CLINDAMYCIN HCL 150 MG PO CAPS
300.0000 mg | ORAL_CAPSULE | Freq: Once | ORAL | Status: AC
  Administered 2017-04-14: 300 mg via ORAL
  Filled 2017-04-14: qty 2

## 2017-04-14 MED ORDER — CLINDAMYCIN HCL 300 MG PO CAPS: 300.0000 mg | ORAL_CAPSULE | Freq: Three times a day (TID) | ORAL | 0 refills | Status: AC

## 2017-04-14 MED ORDER — PSEUDOEPHEDRINE HCL ER 120 MG PO TB12: 120.0000 mg | ORAL_TABLET | Freq: Two times a day (BID) | ORAL | 0 refills | Status: DC

## 2017-04-14 MED ORDER — PSEUDOEPHEDRINE HCL 60 MG PO TABS
30.0000 mg | ORAL_TABLET | Freq: Once | ORAL | Status: AC
  Administered 2017-04-14: 30 mg via ORAL
  Filled 2017-04-14: qty 1

## 2017-04-14 NOTE — ED Notes (Signed)
Dr L in to assess 

## 2017-04-14 NOTE — ED Triage Notes (Signed)
Pt complaining of left sided head/ neck pain. States this has ben happening for the last 2 weeks. When she stands up, she gets very dizzy. Pt states she has lost weight because she has been under a lot of stress. Pt has been taking Ibuprofen and Tylenol with no relief of pain. Pt states eyes has gotten red as well. Pt ambulated to the room without a problem.

## 2017-04-14 NOTE — ED Notes (Signed)
Pt states she was diagnosed with a sinus infection, but it hasn't gotten any better and wants blood work drawn to see and understand where the headaches and dizziness are coming from

## 2017-04-14 NOTE — Discharge Instructions (Signed)
As discussed, today's evaluation has been generally reassuring, and there is suspicion for sinusitis contributing to your facial pain, neck tightness and dizziness. Please be sure to take all medication as directed and follow-up with your primary care physician, preferably tomorrow, as scheduled.  Return here for concerning changes in your condition.

## 2017-04-14 NOTE — ED Provider Notes (Signed)
[facility] EMERGENCY DEPARTMENT Provider Note   CSN: [id number] Arrival date & time: 04/14/17  1246     History   Chief Complaint Chief Complaint  Patient presents with  . Dizziness    HPI Amber Drake is a 57 y.o. female.  HPI  Patient presents concern of facial pain, left neck pain and dizziness. Patient has multiple medical issues, states that over the past 2 weeks or so she has had increasing pain in the left face, left neck, transiently improved with OTC medication. Now, as the pain is become worse over the past day presents for evaluation. She notes there is associated tightness, rhinorrhea, minor headache, no vision loss, no syncope. No chest pain, no dyspnea. She has seen one physician, was informed that she may have sinusitis, but has not been taking any new medicine since that time. She also complains of mild bilateral eye discharge, but no vision pain. She states that she was well prior to the onset of symptoms. No recent sick contacts.   Past Medical History:  Diagnosis Date  . Anemia   . Arthritis   . Blood dyscrasia    sickle cell trait  . Carbuncle and furuncle   . Hypercholesterolemia   . Hypertension    does not take meds  . Sickle cell trait [facility])     Patient Active Problem List   Diagnosis Date Noted  . Hidradenitis 07/31/2016  . Recurrent boils 07/31/2016  . Hyperlipidemia 06/15/2015  . Pain of left hand 05/19/2015  . Hand pain, right 05/19/2015  . Hematuria 03/03/2015  . Esophageal reflux 03/03/2015  . Essential hypertension, benign 03/03/2015  . Bronchitis due to tobacco use ([place]) 10/17/2010    Past Surgical History:  Procedure Laterality Date  . APPENDECTOMY    . CARPAL TUNNEL RELEASE  03/23/2011   Procedure: CARPAL TUNNEL RELEASE;  Surgeon: Sanjuana Kava;  Location: [facility];  Service: Orthopedics;  Laterality: Left;  . CARPAL TUNNEL RELEASE  05/10/2011   Procedure: CARPAL TUNNEL RELEASE;  Surgeon: Sanjuana Kava, MD;  Location:  [facility];  Service: Orthopedics;  Laterality: Right;  . CESAREAN SECTION     x 2  . INCISION AND DRAINAGE PERIRECTAL ABSCESS  12/21/09  . PROCTOSCOPY  10/17/2010   Procedure: PROCTOSCOPY;  Surgeon: Scherry Ran;  Location: [facility] ORS;  Service: General;  Laterality: N/A;  Rigid Proctoscopy/Possible Fistula in Ano  Procedure ended at 1003  . THERAPEUTIC ABORTION     x2    OB History    Gravida Para Term Preterm AB Living   4 2 2   2 2    SAB TAB Ectopic Multiple Live Births                   Home Medications    Prior to Admission medications   Medication Sig Start Date End Date Taking? Authorizing Provider  atorvastatin (LIPITOR) 20 MG tablet Take 1 tablet (20 mg total) by mouth daily. 03/14/15   Soyla Dryer, PA-C  benzonatate (TESSALON) 100 MG capsule Take 1 capsule (100 mg total) by mouth every 8 (eight) hours. 12/21/16   Emeline General, PA-C  clindamycin (CLEOCIN) 300 MG capsule Take 1 capsule (300 mg total) by mouth 3 (three) times daily for 7 days. 04/14/17 04/21/17  Carmin Muskrat, MD  cyclobenzaprine (FLEXERIL) 10 MG tablet Take 1 tablet (10 mg total) by mouth 3 (three) times daily as needed for muscle spasms. 11/07/16   Sanjuana Kava, MD  ferrous sulfate 325 (65  FE) MG tablet Take 325 mg by mouth daily with breakfast.    [provider]  HYDROcodone-acetaminophen (NORCO) 7.5-325 MG tablet Take 1 tablet by mouth every 6 (six) hours as needed for moderate pain (Must last 30 days.Do not drive a call or operate machinery while on this medicine.). 03/18/17   Sanjuana Kava, MD  lisinopril-hydrochlorothiazide (PRINZIDE,ZESTORETIC) 20-12.5 MG tablet Take 1 tablet by mouth daily.    [provider]  Multiple Vitamin (MULTIVITAMIN WITH MINERALS) TABS tablet Take 1 tablet by mouth daily.    [provider]  nabumetone (RELAFEN) 750 MG tablet Take 1 tablet (750 mg total) by mouth 2 (two) times daily. One by mouth twice a day after eating. 11/07/16    Sanjuana Kava, MD  predniSONE (STERAPRED UNI-PAK 21 TAB) 5 MG (21) TBPK tablet Take 6 pills first day; 5 pills second day; 4 pills third day; 3 pills fourth day; 2 pills next day and 1 pill last day. 10/23/16   Sanjuana Kava, MD  pseudoephedrine (SUDAFED 12 HOUR) 120 MG 12 hr tablet Take 1 tablet (120 mg total) by mouth 2 (two) times daily. Use twice daily for five days 04/14/17   Carmin Muskrat, MD  silver sulfADIAZINE (SILVADENE) 1 % cream Apply 1 application topically 2 (two) times daily. 07/31/16   Estill Dooms, NP    Family History Family History  Problem Relation Age of Onset  . Kidney failure Daughter   . Sickle cell anemia Daughter   . Sickle cell trait Daughter   . Sickle cell anemia Son   . Hypertension Mother   . Hyperlipidemia Mother   . Hypertension Father   . Lupus Father        skin  . Hypertension Other   . Sarcoidosis Sister   . Anesthesia problems Neg Hx   . Hypotension Neg Hx   . Malignant hyperthermia Neg Hx   . Pseudochol deficiency Neg Hx     Social History Social History   Tobacco Use  . Smoking status: Current Every Day Smoker    Packs/day: 0.50    Years: 30.00    Pack years: 15.00    Types: Cigarettes  . Smokeless tobacco: Never Used  Substance Use Topics  . Alcohol use: No  . 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 Negative for photophobia, pain, redness, itching and visual disturbance.  Respiratory:       Per HPI, otherwise negative  Cardiovascular:       Per HPI, otherwise negative  Gastrointestinal: Negative for vomiting.  Endocrine:       Negative aside from HPI  Genitourinary:       Neg aside from HPI   Musculoskeletal:       Per HPI, otherwise negative  Skin: Negative.     Neurological: Negative for syncope.     Physical Exam Updated Vital Signs BP 124/79 (BP Location: Left Arm)   Pulse 81   Temp 98.4 F (36.9 C) (Oral)   Resp 15   Ht 5\' 9"  (1.753 m)   Wt 78.3 kg (172 lb 9 oz)   LMP 08/12/2010   SpO2 99%   BMI 25.48 kg/m   Physical  Exam  Constitutional: She is oriented to person, place, and time. She appears well-developed and well-nourished. No distress.  HENT:  Head: Normocephalic and atraumatic.    Eyes: Conjunctivae and EOM are normal. Right eye exhibits discharge. Right eye exhibits no exudate. No foreign body present in the right eye. Left eye exhibits discharge. Left eye exhibits no exudate. No foreign body present in the left eye. Right conjunctiva is not injected. Right conjunctiva has no hemorrhage. Left conjunctiva is not injected. Left conjunctiva has no hemorrhage. Right eye exhibits normal extraocular motion and no nystagmus. Left eye exhibits normal extraocular motion and no nystagmus. Right pupil is reactive. Left pupil is reactive.  Neck: Muscular tenderness present. No spinous process tenderness present. No neck rigidity. No edema, no erythema and normal range of motion present.    Cardiovascular: Normal rate and regular rhythm.  Pulmonary/Chest: Effort normal and breath sounds normal. No stridor. No respiratory distress.  Abdominal: She exhibits no distension.  Musculoskeletal: She exhibits no edema.  Neurological: She is alert and oriented to person, place, and time. No cranial nerve deficit.  Skin: Skin is warm and dry.  Psychiatric: She has a normal mood and affect.  Nursing note and vitals reviewed.    ED Treatments / Results  Labs (all labs ordered are listed, but only abnormal results are displayed) Labs Reviewed - No data to display  EKG  EKG Interpretation  Date/Time:  Sunday April 14 2017 13:01:26 EST Ventricular Rate:  80 PR Interval:    QRS Duration: 87 QT Interval:  362 QTC Calculation: 418 R  Axis:   55 Text Interpretation:  Sinus rhythm Low voltage, precordial leads Probable anteroseptal infarct, old Borderline ECG Confirmed by Chalese Peach ([id number]) on 04/14/2017 1:04:54 PM       Radiology No results found.  Procedures Procedures (including critical care time)  Medications Ordered in ED Medications  clindamycin (CLEOCIN) capsule 300 mg (not administered)  pseudoephedrine (SUDAFED) tablet 30 mg (not administered)     Initial Impression / Assessment and Plan / ED Course  I have reviewed the triage vital signs and the nursing notes.  Pertinent labs & imaging results that were available during my care of the patient were reviewed by me and considered in my medical decision making (see chart for details).  This previously well female presents with concern of facial pain, neck tightness, headache. Progression of symptoms over the past days, and persistency for greater than 1 week suggest possible sinusitis. Patient is otherwise neurologically intact, hemodynamically stable, awake and alert. No evidence for neurologic dysfunction, and given the after mentioned reassuring vital signs, no indication for advanced imaging. With no fever, labs not indicated. Patient started on a course of Sudafed, antibiotics for likely sinusitis Patient already has a primary care visit scheduled for tomorrow, was encouraged to keep that visit for a repeat evaluation.  Final Clinical Impressions(s) / ED Diagnoses   Final diagnoses:  Facial pain    ED Discharge Orders        Ordered    clindamycin (CLEOCIN) 300 MG capsule  3 times daily     04/14/17 1324    pseudoephedrine (SUDAFED 12 HOUR) 120 MG 12 hr tablet  2 times daily     01 /13/19 1324       Carmin Muskrat, MD 04/14/17 1327

## 2017-04-14 NOTE — ED Notes (Signed)
Pt reports Dr Legrand Rams is PCP and Dr Luna Glasgow is her pain doctor  Pt reports pain when she turns her head to the right- she is tender to palpation to her L frontal sinus area  She is a smoker, and has an appt next week for eval  States her doctor has said that she may have arthritis or may have lupus
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
## 2017-04-14 NOTE — ED Notes (Signed)
Pt ambulates heel to toe without stagger or drift 

## 2017-04-17 ENCOUNTER — Emergency Department (HOSPITAL_COMMUNITY)
Admission: EM | Admit: 2017-04-17 | Discharge: 2017-04-18 | Disposition: A | Discharge status: Home / Self Care | Payer: BLUE CROSS/BLUE SHIELD | Attending: Emergency Medicine | Admitting: Emergency Medicine

## 2017-04-17 ENCOUNTER — Telehealth: Payer: Self-pay | Admitting: Orthopaedic Surgery

## 2017-04-17 ENCOUNTER — Encounter (HOSPITAL_COMMUNITY): Payer: Self-pay | Admitting: Emergency Medicine

## 2017-04-17 ENCOUNTER — Other Ambulatory Visit: Payer: Self-pay

## 2017-04-17 DIAGNOSIS — R3129 Other microscopic hematuria: Secondary | ICD-10-CM

## 2017-04-17 DIAGNOSIS — R42 Dizziness and giddiness: Secondary | ICD-10-CM

## 2017-04-17 DIAGNOSIS — I10 Essential (primary) hypertension: Secondary | ICD-10-CM | POA: Diagnosis not present

## 2017-04-17 DIAGNOSIS — E876 Hypokalemia: Secondary | ICD-10-CM | POA: Diagnosis not present

## 2017-04-17 DIAGNOSIS — R634 Abnormal weight loss: Secondary | ICD-10-CM | POA: Diagnosis not present

## 2017-04-17 DIAGNOSIS — T502X5A Adverse effect of carbonic-anhydrase inhibitors, benzothiadiazides and other diuretics, initial encounter: Secondary | ICD-10-CM

## 2017-04-17 DIAGNOSIS — R05 Cough: Secondary | ICD-10-CM | POA: Diagnosis not present

## 2017-04-17 DIAGNOSIS — F17210 Nicotine dependence, cigarettes, uncomplicated: Secondary | ICD-10-CM | POA: Insufficient documentation

## 2017-04-17 DIAGNOSIS — R300 Dysuria: Secondary | ICD-10-CM | POA: Diagnosis not present

## 2017-04-17 DIAGNOSIS — Z79899 Other long term (current) drug therapy: Secondary | ICD-10-CM | POA: Diagnosis not present

## 2017-04-17 MED ORDER — HYDROCODONE-ACETAMINOPHEN 7.5-325 MG PO TABS: 1.0000 | ORAL_TABLET | Freq: Four times a day (QID) | ORAL | 0 refills | Status: DC | PRN

## 2017-04-17 NOTE — Telephone Encounter (Signed)
Patient requests refill:  HYDROcodone-acetaminophen (NORCO) 7.5-325 MG tablet 60 tablet   - [employer]

## 2017-04-17 NOTE — ED Triage Notes (Signed)
Pt c/o intermittent dizziness and unexplained weight loss. Pt was seen this week and diagnosed with sinus infection.

## 2017-04-18 ENCOUNTER — Emergency Department (HOSPITAL_COMMUNITY): Payer: BLUE CROSS/BLUE SHIELD

## 2017-04-18 DIAGNOSIS — R05 Cough: Secondary | ICD-10-CM | POA: Diagnosis not present

## 2017-04-18 LAB — URINALYSIS, ROUTINE W REFLEX MICROSCOPIC
BACTERIA UA: NONE SEEN
Bilirubin Urine: NEGATIVE
Glucose, UA: NEGATIVE mg/dL
Ketones, ur: NEGATIVE mg/dL
Leukocytes, UA: NEGATIVE
NITRITE: NEGATIVE
PROTEIN: NEGATIVE mg/dL
Specific Gravity, Urine: 1.013 (ref 1.005–1.030)
pH: 5 (ref 5.0–8.0)

## 2017-04-18 LAB — CBC WITH DIFFERENTIAL/PLATELET
Basophils Absolute: 0 10*3/uL (ref 0.0–0.1)
Basophils Relative: 0 %
EOS ABS: 0.1 10*3/uL (ref 0.0–0.7)
Eosinophils Relative: 2 %
HEMATOCRIT: 36.3 % (ref 36.0–46.0)
HEMOGLOBIN: 12.2 g/dL (ref 12.0–15.0)
LYMPHS ABS: 2.9 10*3/uL (ref 0.7–4.0)
Lymphocytes Relative: 37 %
MCH: 28.9 pg (ref 26.0–34.0)
MCHC: 33.6 g/dL (ref 30.0–36.0)
MCV: 86 fL (ref 78.0–100.0)
MONOS PCT: 6 %
Monocytes Absolute: 0.4 10*3/uL (ref 0.1–1.0)
NEUTROS ABS: 4.3 10*3/uL (ref 1.7–7.7)
NEUTROS PCT: 55 %
Platelets: 365 10*3/uL (ref 150–400)
RBC: 4.22 MIL/uL (ref 3.87–5.11)
RDW: 12.9 % (ref 11.5–15.5)
WBC: 7.7 10*3/uL (ref 4.0–10.5)

## 2017-04-18 LAB — COMPREHENSIVE METABOLIC PANEL
ALT: 15 U/L (ref 14–54)
AST: 17 U/L (ref 15–41)
Albumin: 4.1 g/dL (ref 3.5–5.0)
Alkaline Phosphatase: 57 U/L (ref 38–126)
Anion gap: 10 (ref 5–15)
BUN: 18 mg/dL (ref 6–20)
CHLORIDE: 101 mmol/L (ref 101–111)
CO2: 29 mmol/L (ref 22–32)
CREATININE: 0.92 mg/dL (ref 0.44–1.00)
Calcium: 10 mg/dL (ref 8.9–10.3)
GFR calc non Af Amer: >60 mL/min (ref >60)
Glucose, Bld: 108 mg/dL — ABNORMAL HIGH (ref 65–99)
Potassium: 3.3 mmol/L — ABNORMAL LOW (ref 3.5–5.1)
SODIUM: 140 mmol/L (ref 135–145)
Total Bilirubin: 0.5 mg/dL (ref 0.3–1.2)
Total Protein: 8.4 g/dL — ABNORMAL HIGH (ref 6.5–8.1)

## 2017-04-18 MED ORDER — POTASSIUM CHLORIDE CRYS ER 20 MEQ PO TBCR
40.0000 meq | EXTENDED_RELEASE_TABLET | Freq: Once | ORAL | Status: AC
  Administered 2017-04-18: 40 meq via ORAL
  Filled 2017-04-18: qty 2

## 2017-04-18 MED ORDER — POTASSIUM CHLORIDE CRYS ER 20 MEQ PO TBCR: EXTENDED_RELEASE_TABLET | ORAL | 0 refills | Status: DC

## 2017-04-18 NOTE — ED Provider Notes (Signed)
[facility] EMERGENCY DEPARTMENT Provider Note   CSN: [id number] Arrival date & time: 04/17/17  2250     History   Chief Complaint Chief Complaint  Patient presents with  . Dizziness    HPI Amber Drake is a 57 y.o. female.  The history is provided by the patient.  She has a history of hypertension, hyperlipidemia, gastroesophageal reflux, sickle cell trait and comes in with a variety of complaints.  She has been having pain in the left side of her neck which radiates to the left side of her head.  This pain is mainly there when she wakes up in the morning.  It is worse with head movement.  She was seen in the ED 2 days ago and diagnosed with sinusitis and placed on antibiotics.  She has not had any improvement in symptoms since starting the antibiotic.  For about the last week, she has noted that she sometimes has some transient dizziness when she stands up.  She denies vertigo, nausea, vomiting.  He is also concerned about involuntary weight loss of about 8 pounds over the last week.  She has had good appetite.  There has been a mild cough which is nonproductive.  She denies fever chills or sweats.  She denies change in bowel pattern.  She is a cigarette smoker.  She does relate that she has been under stress with 2 of her children having been in the hospital recently.  She was seen at her doctor's office earlier today and was told that she had some blood in her urine.  Past Medical History:  Diagnosis Date  . Anemia   . Arthritis   . Blood dyscrasia    sickle cell trait  . Carbuncle and furuncle   . Hypercholesterolemia   . Hypertension    does not take meds  . Sickle cell trait [facility])     Patient Active Problem List   Diagnosis Date Noted  . Hidradenitis 07/31/2016  . Recurrent boils 07/31/2016  . Hyperlipidemia 06/15/2015  . Pain of left hand 05/19/2015  . Hand pain, right 05/19/2015  . Hematuria 03/03/2015  . Esophageal reflux 03/03/2015  . Essential hypertension,  benign 03/03/2015  . Bronchitis due to tobacco use ([place]) 10/17/2010    Past Surgical History:  Procedure Laterality Date  . APPENDECTOMY    . CARPAL TUNNEL RELEASE  03/23/2011   Procedure: CARPAL TUNNEL RELEASE;  Surgeon: Sanjuana Kava;  Location: [facility];  Service: Orthopedics;  Laterality: Left;  . CARPAL TUNNEL RELEASE  05/10/2011   Procedure: CARPAL TUNNEL RELEASE;  Surgeon: Sanjuana Kava, MD;  Location: [facility];  Service: Orthopedics;  Laterality: Right;  . CESAREAN SECTION     x 2  . INCISION AND DRAINAGE PERIRECTAL ABSCESS  12/21/09  . PROCTOSCOPY  10/17/2010   Procedure: PROCTOSCOPY;  Surgeon: Scherry Ran;  Location: [facility] ORS;  Service: General;  Laterality: N/A;  Rigid Proctoscopy/Possible Fistula in Ano  Procedure ended at 1003  . THERAPEUTIC ABORTION     x2    OB History    Gravida Para Term Preterm AB Living   4 2 2   2 2    SAB TAB Ectopic Multiple Live Births                   Home Medications    Prior to Admission medications   Medication Sig Start Date End Date Taking? Authorizing Provider  atorvastatin (LIPITOR) 20 MG tablet Take 1 tablet (20 mg total) by  mouth daily. 03/14/15   Soyla Dryer, PA-C  benzonatate (TESSALON) 100 MG capsule Take 1 capsule (100 mg total) by mouth every 8 (eight) hours. 12/21/16   Emeline General, PA-C  clindamycin (CLEOCIN) 300 MG capsule Take 1 capsule (300 mg total) by mouth 3 (three) times daily for 7 days. 04/14/17 04/21/17  Carmin Muskrat, MD  cyclobenzaprine (FLEXERIL) 10 MG tablet Take 1 tablet (10 mg total) by mouth 3 (three) times daily as needed for muscle spasms. 11/07/16   Sanjuana Kava, MD  ferrous sulfate 325 (65 FE) MG tablet Take 325 mg by mouth daily with breakfast.    [provider]  HYDROcodone-acetaminophen (NORCO) 7.5-325 MG tablet Take 1 tablet by mouth every 6 (six) hours as needed for moderate pain (Must last 30 days.Do not drive a call or operate machinery while on this medicine.). 04/17/17    Sanjuana Kava, MD  lisinopril-hydrochlorothiazide (PRINZIDE,ZESTORETIC) 20-12.5 MG tablet Take 1 tablet by mouth daily.    [provider]  Multiple Vitamin (MULTIVITAMIN WITH MINERALS) TABS tablet Take 1 tablet by mouth daily.    [provider]  nabumetone (RELAFEN) 750 MG tablet Take 1 tablet (750 mg total) by mouth 2 (two) times daily. One by mouth twice a day after eating. 11/07/16   Sanjuana Kava, MD  predniSONE (STERAPRED UNI-PAK 21 TAB) 5 MG (21) TBPK tablet Take 6 pills first day; 5 pills second day; 4 pills third day; 3 pills fourth day; 2 pills next day and 1 pill last day. 10/23/16   Sanjuana Kava, MD  pseudoephedrine (SUDAFED 12 HOUR) 120 MG 12 hr tablet Take 1 tablet (120 mg total) by mouth 2 (two) times daily. Use twice daily for five days 04/14/17   Carmin Muskrat, MD  silver sulfADIAZINE (SILVADENE) 1 % cream Apply 1 application topically 2 (two) times daily. 07/31/16   Estill Dooms, NP    Family History Family History  Problem Relation Age of Onset  . Kidney failure Daughter   . Sickle cell anemia Daughter   . Sickle cell trait Daughter   . Sickle cell anemia Son   . Hypertension Mother   . Hyperlipidemia Mother   . Hypertension Father   . Lupus Father        skin  . Hypertension Other   . Sarcoidosis Sister   . Anesthesia problems Neg Hx   . Hypotension Neg Hx   . Malignant hyperthermia Neg Hx   . Pseudochol deficiency Neg Hx     Social History Social History   Tobacco Use  . Smoking status: Current Every Day Smoker    Packs/day: 0.50    Years: 30.00    Pack years: 15.00    Types: Cigarettes  . Smokeless tobacco: Never Used  Substance Use Topics  . Alcohol use: No  . 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    Physical Exam Updated Vital Signs BP 108/70   Pulse  (!) 101   Temp 97.7 F (36.5 C)   Resp 14   Ht 5\' 9"  (1.753 m)   Wt 76.5 kg (168 lb 11.2 oz)   LMP 08/12/2010   SpO2 99%   BMI 24.91 kg/m   Physical Exam  Nursing note and vitals reviewed.  57 year old female, resting comfortably and in no  acute distress. Vital signs are normal. Oxygen saturation is 99%, which is normal. Head is normocephalic and atraumatic. PERRLA, EOMI. Oropharynx is clear. Neck is tender over the left sternocleidomastoid muscle.  Pain is elicited with passive rotation of the head to the right and passive lateral bending of the head to the right.  There is no adenopathy or JVD. Back is nontender and there is no CVA tenderness. Lungs are clear without rales, wheezes, or rhonchi. Chest is nontender. Heart has regular rate and rhythm without murmur. Abdomen is soft, flat, nontender without masses or hepatosplenomegaly and peristalsis is normoactive. Extremities have no cyanosis or edema, full range of motion is present. Skin is warm and dry without rash. Neurologic: Mental status is normal, cranial nerves are intact, there are no motor or sensory deficits.  Dizziness is not reproduced by passive head movement.  ED Treatments / Results  Labs (all labs ordered are listed, but only abnormal results are displayed) Labs Reviewed  URINALYSIS, ROUTINE W REFLEX MICROSCOPIC - Abnormal; Notable for the following components:      Result Value   APPearance HAZY (*)    Hgb urine dipstick MODERATE (*)    Squamous Epithelial / LPF 0-5 (*)    All other components within normal limits  COMPREHENSIVE METABOLIC PANEL - Abnormal; Notable for the following components:   Potassium 3.3 (*)    Glucose, Bld 108 (*)    Total Protein 8.4 (*)    All other components within normal limits  CBC WITH DIFFERENTIAL/PLATELET    Radiology Dg Chest 2 View  Result Date: 04/18/2017 CLINICAL DATA:  Cough and weight loss EXAM: CHEST  2 VIEW COMPARISON:  12/21/2016 chest radiograph. FINDINGS:  Stable cardiomediastinal silhouette with normal heart size. No pneumothorax. No pleural effusion. Lungs appear clear, with no acute consolidative airspace disease and no pulmonary edema. IMPRESSION: No active cardiopulmonary disease. Electronically Signed   By: Ilona Sorrel M.D.   On: 04/18/2017 02:04    Procedures Procedures (including critical care time)  Medications Ordered in ED Medications  potassium chloride SA (K-DUR,KLOR-CON) CR tablet 40 mEq (40 mEq Oral Given 04/18/17 0304)     Initial Impression / Assessment and Plan / ED Course  I have reviewed the triage vital signs and the nursing notes.  Pertinent labs & imaging results that were available during my care of the patient were reviewed by me and considered in my medical decision making (see chart for details).  Neck and head pain which seem to be related to spasm of the left sternocleidomastoid muscle.  Weight loss of uncertain cause, possibly related to emotional distress.  Orthostatic dizziness.  Orthostatic vital signs showed no significant change in pulse or blood pressure, but she did not get dizzy with that maneuver here.  Old records are reviewed confirming recent ED visit with diagnosis of sinusitis treated with clindamycin.  Given smoking history, will check chest x-ray and will check metabolic panel.  If negative, will refer back to PCP for ongoing evaluation of weight loss.  Anticipate discharge with prescription for muscle relaxer and NSAIDs for her neck pain.  Workup is significant only for mild hypokalemia which is apparently due to hydrochlorothiazide.  She will have an ongoing need for potassium supplementation because she is on a diuretic.  She is discharged with prescription for K Dur.  Referred to urology for further evaluation of microscopic hematuria.  Final Clinical Impressions(s) / ED Diagnoses   Final diagnoses:  Weight loss, non-intentional  Orthostatic dizziness  Diuretic-induced  hypokalemia    Microscopic hematuria    ED Discharge Orders        Ordered    potassium chloride SA (K-DUR,KLOR-CON) 20 MEQ tablet     64/68/03 2122       Delora Fuel, MD 48/25/00 [phone]

## 2017-04-18 NOTE — Discharge Instructions (Signed)
Your tests did not show the reason for your weight loss. Please follow up with your doctor for additional testing. Follow up with the urologist regarding the blood in your urine.

## 2017-04-24 ENCOUNTER — Encounter (HOSPITAL_COMMUNITY): Payer: Self-pay | Admitting: Emergency Medicine

## 2017-04-24 ENCOUNTER — Emergency Department (HOSPITAL_COMMUNITY)
Admission: EM | Admit: 2017-04-24 | Discharge: 2017-04-24 | Disposition: A | Discharge status: Home / Self Care | Payer: BLUE CROSS/BLUE SHIELD | Attending: Emergency Medicine | Admitting: Emergency Medicine

## 2017-04-24 ENCOUNTER — Other Ambulatory Visit: Payer: Self-pay

## 2017-04-24 DIAGNOSIS — L02412 Cutaneous abscess of left axilla: Secondary | ICD-10-CM | POA: Diagnosis not present

## 2017-04-24 DIAGNOSIS — Z79899 Other long term (current) drug therapy: Secondary | ICD-10-CM | POA: Diagnosis not present

## 2017-04-24 DIAGNOSIS — M79622 Pain in left upper arm: Secondary | ICD-10-CM | POA: Diagnosis not present

## 2017-04-24 DIAGNOSIS — I10 Essential (primary) hypertension: Secondary | ICD-10-CM | POA: Diagnosis not present

## 2017-04-24 DIAGNOSIS — F17210 Nicotine dependence, cigarettes, uncomplicated: Secondary | ICD-10-CM | POA: Diagnosis not present

## 2017-04-24 MED ORDER — CLINDAMYCIN HCL 150 MG PO CAPS
300.0000 mg | ORAL_CAPSULE | Freq: Once | ORAL | Status: AC
  Administered 2017-04-24: 300 mg via ORAL
  Filled 2017-04-24: qty 2

## 2017-04-24 MED ORDER — CLINDAMYCIN HCL 300 MG PO CAPS: 300.0000 mg | ORAL_CAPSULE | Freq: Four times a day (QID) | ORAL | 0 refills | Status: DC

## 2017-04-24 NOTE — ED Triage Notes (Signed)
Pt reports abscess near L axilla X2 days, denies drainage, N/V/D/. Has hx of abscesses that needed I&D before.

## 2017-04-24 NOTE — ED Provider Notes (Signed)
[facility] EMERGENCY DEPARTMENT Provider Note   CSN: [id number] Arrival date & time: 04/24/17  2255     History   Chief Complaint Chief Complaint  Patient presents with  . Abscess    HPI Amber Drake is a 57 y.o. female.  Patient is a 56 year old female with past medical history of anemia and recurrent abscesses.  She presents with pain and swelling in her left axilla.  She denies any fevers or chills.   The history is provided by the patient.  Abscess  Abscess location: Left axilla. Abscess quality: fluctuance, induration and painful   Abscess quality: not draining   Duration:  3 days Progression:  Worsening Pain details:    Quality:  Throbbing   Severity:  Moderate   Timing:  Constant   Progression:  Worsening Chronicity:  Recurrent Relieved by:  Nothing Worsened by:  Nothing   Past Medical History:  Diagnosis Date  . Anemia   . Arthritis   . Blood dyscrasia    sickle cell trait  . Carbuncle and furuncle   . Hypercholesterolemia   . Hypertension    does not take meds  . Sickle cell trait [facility])     Patient Active Problem List   Diagnosis Date Noted  . Hidradenitis 07/31/2016  . Recurrent boils 07/31/2016  . Hyperlipidemia 06/15/2015  . Pain of left hand 05/19/2015  . Hand pain, right 05/19/2015  . Hematuria 03/03/2015  . Esophageal reflux 03/03/2015  . Essential hypertension, benign 03/03/2015  . Bronchitis due to tobacco use ([place]) 10/17/2010    Past Surgical History:  Procedure Laterality Date  . APPENDECTOMY    . CARPAL TUNNEL RELEASE  03/23/2011   Procedure: CARPAL TUNNEL RELEASE;  Surgeon: Sanjuana Kava;  Location: [facility];  Service: Orthopedics;  Laterality: Left;  . CARPAL TUNNEL RELEASE  05/10/2011   Procedure: CARPAL TUNNEL RELEASE;  Surgeon: Sanjuana Kava, MD;  Location: [facility];  Service: Orthopedics;  Laterality: Right;  . CESAREAN SECTION     x 2  . INCISION AND DRAINAGE PERIRECTAL ABSCESS  12/21/09  . PROCTOSCOPY  10/17/2010   Procedure: PROCTOSCOPY;  Surgeon: Scherry Ran;  Location: [facility];  Service: General;  Laterality: N/A;  Rigid Proctoscopy/Possible Fistula in Ano  Procedure ended at 1003  . THERAPEUTIC ABORTION     x2    OB History    Gravida Para Term Preterm AB Living   4 2 2   2 2    SAB TAB Ectopic Multiple Live Births                   Home Medications    Prior to Admission medications   Medication Sig Start Date End Date Taking? Authorizing Provider  atorvastatin (LIPITOR) 20 MG tablet Take 1 tablet (20 mg total) by mouth daily. 03/14/15   Soyla Dryer, PA-C  benzonatate (TESSALON) 100 MG capsule Take 1 capsule (100 mg total) by mouth every 8 (eight) hours. 12/21/16   Emeline General, PA-C  cyclobenzaprine (FLEXERIL) 10 MG tablet Take 1 tablet (10 mg total) by mouth 3 (three) times daily as needed for muscle spasms. 11/07/16   Sanjuana Kava, MD  ferrous sulfate 325 (65 FE) MG tablet Take 325 mg by mouth daily with breakfast.    [provider]  HYDROcodone-acetaminophen (NORCO) 7.5-325 MG tablet Take 1 tablet by mouth every 6 (six) hours as needed for moderate pain (Must last 30 days.Do not drive a call or operate machinery while on this medicine.).  04/17/17   Sanjuana Kava, MD  lisinopril-hydrochlorothiazide (PRINZIDE,ZESTORETIC) 20-12.5 MG tablet Take 1 tablet by mouth daily.    [provider]  Multiple Vitamin (MULTIVITAMIN WITH MINERALS) TABS tablet Take 1 tablet by mouth daily.    [provider]  nabumetone (RELAFEN) 750 MG tablet Take 1 tablet (750 mg total) by mouth 2 (two) times daily. One by mouth twice a day after eating. 11/07/16   Sanjuana Kava, MD  potassium chloride SA (K-DUR,KLOR-CON) 20 MEQ tablet Take one tablet twice a day for five days, then one tablet a day 1/61/09   Delora Fuel, MD  predniSONE (STERAPRED UNI-PAK 21 TAB) 5 MG (21) TBPK tablet Take 6 pills first day; 5 pills second day; 4 pills third day; 3 pills fourth day; 2 pills  next day and 1 pill last day. 10/23/16   Sanjuana Kava, MD  pseudoephedrine (SUDAFED 12 HOUR) 120 MG 12 hr tablet Take 1 tablet (120 mg total) by mouth 2 (two) times daily. Use twice daily for five days 04/14/17   Carmin Muskrat, MD  silver sulfADIAZINE (SILVADENE) 1 % cream Apply 1 application topically 2 (two) times daily. 07/31/16   Estill Dooms, NP    Family History Family History  Problem Relation Age of Onset  . Kidney failure Daughter   . Sickle cell anemia Daughter   . Sickle cell trait Daughter   . Sickle cell anemia Son   . Hypertension Mother   . Hyperlipidemia Mother   . Hypertension Father   . Lupus Father        skin  . Hypertension Other   . Sarcoidosis Sister   . Anesthesia problems Neg Hx   . Hypotension Neg Hx   . Malignant hyperthermia Neg Hx   . Pseudochol deficiency Neg Hx     Social History Social History   Tobacco Use  . Smoking status: Current Every Day Smoker    Packs/day: 0.50    Years: 30.00    Pack years: 15.00    Types: Cigarettes  . Smokeless tobacco: Never Used  Substance Use Topics  . Alcohol use: No  . 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    Physical Exam Updated Vital Signs BP 135/78 (BP Location: Left Arm)   Pulse 89   Temp 98.5 F (36.9 C) (Oral)   Resp 16   Wt 77.7 kg (171 lb 6.4 oz)   LMP 08/12/2010   SpO2 100%   BMI 25.31 kg/m   Physical Exam  Constitutional: She is oriented to person, place, and time. She appears well-developed and well-nourished. No distress.  HENT:  Head: Normocephalic and atraumatic.  Neck: Normal range of motion. Neck supple.  Neurological: She is alert and oriented to person, place, and time.  Skin: Skin is warm and dry. She is not diaphoretic.  There is a 2 cm, round, fluctuant lesion to the left axilla.  Nursing note  and vitals reviewed.    ED Treatments / Results  Labs (all labs ordered are listed, but only abnormal results are displayed) Labs Reviewed - No data to display  EKG  EKG Interpretation None       Radiology No results found.  Procedures Procedures (including critical care time)  Medications Ordered in ED Medications  clindamycin (CLEOCIN) capsule 300 mg (not  administered)     Initial Impression / Assessment and Plan / ED Course  I have reviewed the triage vital signs and the nursing notes.  Pertinent labs & imaging results that were available during my care of the patient were reviewed by me and considered in my medical decision making (see chart for details).  This appears to be an abscess.  The patient is declining incision and drainage.  She states she has had these before and they usually respond to warm soaks and antibiotics.  She prefers to try this route first.  She understands that this may worsen and ultimately require incision and drainage however she continues to prefer noninvasive therapy.  She will be given clindamycin, advised to soak, and follow-up as needed.  Final Clinical Impressions(s) / ED Diagnoses   Final diagnoses:  None    ED Discharge Orders    None       Veryl Speak, MD 04/24/17 2325

## 2017-04-24 NOTE — Discharge Instructions (Signed)
Clindamycin as prescribed.  Apply warm compresses as frequently as possible for the next several days.  Return to the ED if your symptoms significantly worsen or change.

## 2017-05-16 ENCOUNTER — Encounter: Payer: Self-pay | Admitting: Orthopaedic Surgery

## 2017-05-16 ENCOUNTER — Ambulatory Visit: Payer: BLUE CROSS/BLUE SHIELD | Admitting: Orthopaedic Surgery

## 2017-05-21 ENCOUNTER — Ambulatory Visit (INDEPENDENT_AMBULATORY_CARE_PROVIDER_SITE_OTHER): Payer: BLUE CROSS/BLUE SHIELD | Admitting: Orthopaedic Surgery

## 2017-05-21 ENCOUNTER — Encounter: Payer: Self-pay | Admitting: Orthopaedic Surgery

## 2017-05-21 VITALS — BP 95/71 | HR 82 | Temp 97.6°F | Ht 67.0 in | Wt 174.0 lb

## 2017-05-21 DIAGNOSIS — F17210 Nicotine dependence, cigarettes, uncomplicated: Secondary | ICD-10-CM | POA: Diagnosis not present

## 2017-05-21 DIAGNOSIS — I10 Essential (primary) hypertension: Secondary | ICD-10-CM | POA: Diagnosis not present

## 2017-05-21 DIAGNOSIS — R768 Other specified abnormal immunological findings in serum: Secondary | ICD-10-CM | POA: Diagnosis not present

## 2017-05-21 DIAGNOSIS — M79641 Pain in right hand: Secondary | ICD-10-CM | POA: Diagnosis not present

## 2017-05-21 DIAGNOSIS — M79642 Pain in left hand: Secondary | ICD-10-CM

## 2017-05-21 MED ORDER — HYDROCODONE-ACETAMINOPHEN 7.5-325 MG PO TABS: 1.0000 | ORAL_TABLET | Freq: Four times a day (QID) | ORAL | 0 refills | Status: DC | PRN

## 2017-05-21 NOTE — Progress Notes (Signed)
Patient Amber Drake [name], female DOB:Aug 18, 1960, 57 y.o. ZSW:[id number]  Chief Complaint  Patient presents with  . Hand Pain    HPI  Amber Drake is a 57 y.o. female who has bilateral hand pains.  She was scheduled to see a rheumatologist but had to cancer as her daughter had to have emergency open heart surgery.  Her son was admitted to the hospital for other reasons while her daughter was an inpatient as well. She is taking care of both of them.  She will reschedule with rheumatology soon.  RA and sarcoidosis runs in the family.  She has had more pain on cold mornings.  She has no new trauma.  She is taking her medicine. HPI  Body mass index is 27.25 kg/m.  ROS  Review of Systems  HENT: Negative for congestion.   Respiratory: Negative for cough and shortness of breath.   Cardiovascular: Negative for chest pain and leg swelling.  Endocrine: Positive for cold intolerance.  Musculoskeletal: Positive for arthralgias.  Allergic/Immunologic: Positive for environmental allergies.  All other systems reviewed and are negative.   Past Medical History:  Diagnosis Date  . Anemia   . Arthritis   . Blood dyscrasia    sickle cell trait  . Carbuncle and furuncle   . Hypercholesterolemia   . Hypertension    does not take meds  . Sickle cell trait [facility])     Past Surgical History:  Procedure Laterality Date  . APPENDECTOMY    . CARPAL TUNNEL RELEASE  03/23/2011   Procedure: CARPAL TUNNEL RELEASE;  Surgeon: Sanjuana Kava;  Location: [facility];  Service: Orthopedics;  Laterality: Left;  . CARPAL TUNNEL RELEASE  05/10/2011   Procedure: CARPAL TUNNEL RELEASE;  Surgeon: Sanjuana Kava, MD;  Location: [facility];  Service: Orthopedics;  Laterality: Right;  . CESAREAN SECTION     x 2  . INCISION AND DRAINAGE PERIRECTAL ABSCESS  12/21/09  . PROCTOSCOPY  10/17/2010   Procedure: PROCTOSCOPY;  Surgeon: Scherry Ran;  Location: [facility];  Service: General;  Laterality: N/A;  Rigid  Proctoscopy/Possible Fistula in Ano  Procedure ended at 1003  . THERAPEUTIC ABORTION     x2    Family History  Problem Relation Age of Onset  . Kidney failure Daughter   . Sickle cell anemia Daughter   . Sickle cell trait Daughter   . Sickle cell anemia Son   . Hypertension Mother   . Hyperlipidemia Mother   . Hypertension Father   . Lupus Father        skin  . Hypertension Other   . Sarcoidosis Sister   . Anesthesia problems Neg Hx   . Hypotension Neg Hx   . Malignant hyperthermia Neg Hx   . Pseudochol deficiency Neg Hx     Social History Social History   Tobacco Use  . Smoking status: Current Every Day Smoker    Packs/day: 0.50    Years: 30.00    Pack years: 15.00    Types: Cigarettes  . Smokeless tobacco: Never Used  Substance Use Topics  . Alcohol use: No  . 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 atorvastatin (LIPITOR) 20 MG tablet Take 1 tablet (20 mg total) by mouth daily. 90 tablet 2  . cyclobenzaprine (FLEXERIL) 10 MG tablet Take 1 tablet (10 mg total) by mouth 3 (three) times daily as needed for muscle  spasms. 40 tablet 0  . ferrous sulfate 325 (65 FE) MG tablet Take 325 mg by mouth daily with breakfast.    . HYDROcodone-acetaminophen (NORCO) 7.5-325 MG tablet Take 1 tablet by mouth every 6 (six) hours as needed for moderate pain (Must last 30 days). 40 tablet 0  . lisinopril-hydrochlorothiazide (PRINZIDE,ZESTORETIC) 20-12.5 MG tablet Take 1 tablet by mouth daily.    . Multiple Vitamin (MULTIVITAMIN WITH MINERALS) TABS tablet Take 1 tablet by mouth daily.    . nabumetone (RELAFEN) 750 MG tablet Take 1 tablet (750 mg total) by mouth 2 (two) times daily. One by mouth twice a day after eating. 60 tablet 5  . potassium chloride SA (K-DUR,KLOR-CON) 20 MEQ tablet Take one tablet twice a day for five days, then one tablet a day 35 tablet 0  . benzonatate (TESSALON) 100 MG capsule  Take 1 capsule (100 mg total) by mouth every 8 (eight) hours. (Patient not taking: Reported on 05/21/2017) 21 capsule 0   No current facility-administered medications for this visit.      Physical Exam  Blood pressure 95/71, pulse 82, temperature 97.6 F (36.4 C), height 5\' 7"  (1.702 m), weight 174 lb (78.9 kg), last menstrual period 08/12/2010.  Constitutional: overall normal hygiene, normal nutrition, well developed, normal grooming, normal body habitus. Assistive device:none  Musculoskeletal: gait and station Limp none, muscle tone and strength are normal, no tremors or atrophy is present.  .  Neurological: coordination overall normal.  Deep tendon reflex/nerve stretch intact.  Sensation normal.  Cranial nerves II-XII intact.   Skin:   Normal overall no scars, lesions, ulcers or rashes. No psoriasis.  Psychiatric: Alert and oriented x 3.  Recent memory intact, remote memory unclear.  Normal mood and affect. Well groomed.  Good eye contact.  Cardiovascular: overall no swelling, no varicosities, no edema bilaterally, normal temperatures of the legs and arms, no clubbing, cyanosis and good capillary refill.  Lymphatic: palpation is normal.  Both hands are tender but have no swelling today.  Grips are good.  NV intact.    All other systems reviewed and are negative   The patient has been educated about the nature of the problem(s) and counseled on treatment options.  The patient appeared to understand what I have discussed and is in agreement with it.  Encounter Diagnoses  Name Primary?  . Hand pain, right Yes  . Pain of left hand   . Positive ANA (antinuclear antibody)   . Essential hypertension, benign   . Cigarette nicotine dependence without complication     PLAN Call if any problems.  Precautions discussed.  Continue current medications.   Return to clinic 3 months   I have reviewed the [place] web site prior to  prescribing narcotic medicine for this patient.  Electronically Signed Sanjuana Kava, MD 2/19/20198:53 AM

## 2017-05-21 NOTE — Patient Instructions (Signed)
Steps to Quit Smoking Smoking tobacco can be bad for your health. It can also affect almost every organ in your body. Smoking puts you and people around you at risk for many serious long-lasting (chronic) diseases. Quitting smoking is hard, but it is one of the best things that you can do for your health. It is never too late to quit. What are the benefits of quitting smoking? When you quit smoking, you lower your risk for getting serious diseases and conditions. They can include:  Lung cancer or lung disease.  Heart disease.  Stroke.  Heart attack.  Not being able to have children (infertility).  Weak bones (osteoporosis) and broken bones (fractures).  If you have coughing, wheezing, and shortness of breath, those symptoms may get better when you quit. You may also get sick less often. If you are pregnant, quitting smoking can help to lower your chances of having a baby of low birth weight. What can I do to help me quit smoking? Talk with your doctor about what can help you quit smoking. Some things you can do (strategies) include:  Quitting smoking totally, instead of slowly cutting back how much you smoke over a period of time.  Going to in-person counseling. You are more likely to quit if you go to many counseling sessions.  Using resources and support systems, such as: ? Online chats with a counselor. ? Phone quitlines. ? Printed self-help materials. ? Support groups or group counseling. ? Text messaging programs. ? Mobile phone apps or applications.  Taking medicines. Some of these medicines may have nicotine in them. If you are pregnant or breastfeeding, do not take any medicines to quit smoking unless your doctor says it is okay. Talk with your doctor about counseling or other things that can help you.  Talk with your doctor about using more than one strategy at the same time, such as taking medicines while you are also going to in-person counseling. This can help make  quitting easier. What things can I do to make it easier to quit? Quitting smoking might feel very hard at first, but there is a lot that you can do to make it easier. Take these steps:  Talk to your family and friends. Ask them to support and encourage you.  Call phone quitlines, reach out to support groups, or work with a counselor.  Ask people who smoke to not smoke around you.  Avoid places that make you want (trigger) to smoke, such as: ? Bars. ? Parties. ? Smoke-break areas at work.  Spend time with people who do not smoke.  Lower the stress in your life. Stress can make you want to smoke. Try these things to help your stress: ? Getting regular exercise. ? Deep-breathing exercises. ? Yoga. ? Meditating. ? Doing a body scan. To do this, close your eyes, focus on one area of your body at a time from head to toe, and notice which parts of your body are tense. Try to relax the muscles in those areas.  Download or buy apps on your mobile phone or tablet that can help you stick to your quit plan. There are many free apps, such as QuitGuide from the CDC (Centers for Disease Control and Prevention). You can find more support from smokefree.gov and other websites.  This information is not intended to replace advice given to you by your health care provider. Make sure you discuss any questions you have with your health care provider. Document Released: 01/13/2009 Document   Revised: 11/15/2015 Document Reviewed: 08/03/2014 Elsevier Interactive Patient Education  2018 Elsevier Inc.  

## 2017-06-10 DIAGNOSIS — E669 Obesity, unspecified: Secondary | ICD-10-CM | POA: Diagnosis not present

## 2017-06-10 DIAGNOSIS — R300 Dysuria: Secondary | ICD-10-CM | POA: Diagnosis not present

## 2017-06-10 DIAGNOSIS — Z0001 Encounter for general adult medical examination with abnormal findings: Secondary | ICD-10-CM | POA: Diagnosis not present

## 2017-06-10 DIAGNOSIS — E118 Type 2 diabetes mellitus with unspecified complications: Secondary | ICD-10-CM | POA: Diagnosis not present

## 2017-06-10 DIAGNOSIS — I10 Essential (primary) hypertension: Secondary | ICD-10-CM | POA: Diagnosis not present

## 2017-06-10 DIAGNOSIS — F17200 Nicotine dependence, unspecified, uncomplicated: Secondary | ICD-10-CM | POA: Diagnosis not present

## 2017-06-10 DIAGNOSIS — F17210 Nicotine dependence, cigarettes, uncomplicated: Secondary | ICD-10-CM | POA: Diagnosis not present

## 2017-06-10 DIAGNOSIS — E785 Hyperlipidemia, unspecified: Secondary | ICD-10-CM | POA: Diagnosis not present

## 2017-06-25 ENCOUNTER — Other Ambulatory Visit: Payer: Self-pay | Admitting: Orthopaedic Surgery

## 2017-06-25 NOTE — Telephone Encounter (Signed)
Hydrocodone-Acetaminophen  7.5/325 mg  Qty 40 Tablets  Take 1 tablet by mouth every 6 (six) hours as needed for moderate pain (Must last 30 days).  PATIENT USES [facility]

## 2017-06-26 MED ORDER — HYDROCODONE-ACETAMINOPHEN 7.5-325 MG PO TABS: 1.0000 | ORAL_TABLET | Freq: Four times a day (QID) | ORAL | 0 refills | Status: DC | PRN

## 2017-07-24 ENCOUNTER — Other Ambulatory Visit: Payer: Self-pay | Admitting: Orthopaedic Surgery

## 2017-07-24 NOTE — Telephone Encounter (Signed)
Too soon, I put a note on it not to fill until Friday

## 2017-07-24 NOTE — Telephone Encounter (Signed)
Patient of Dr. Brooke Bonito requests refill on Hydrocodone/Acetaminophen 7.5-325  Mgs.   Qty  40       Sig: Take 1 tablet by mouth every 6 (six) hours as needed for moderate pain (Must last 30 days).     Patient states she uses [facility]

## 2017-07-26 ENCOUNTER — Other Ambulatory Visit: Payer: Self-pay | Admitting: Orthopaedic Surgery

## 2017-07-26 NOTE — Telephone Encounter (Signed)
Patient of Dr. Brooke Bonito requests refill on Hydrocodone/Acetaminophen 7.5-325 mgs.  Qty  40  Sig: Take 1 tablet by mouth every 6 (six) hours as needed for moderate pain (Must last 30 days).  Patient states she uses [facility]

## 2017-07-29 MED ORDER — HYDROCODONE-ACETAMINOPHEN 7.5-325 MG PO TABS: 1.0000 | ORAL_TABLET | Freq: Four times a day (QID) | ORAL | 0 refills | Status: DC | PRN

## 2017-08-13 DIAGNOSIS — L0390 Cellulitis, unspecified: Secondary | ICD-10-CM | POA: Diagnosis not present

## 2017-08-15 ENCOUNTER — Encounter: Payer: Self-pay | Admitting: Orthopaedic Surgery

## 2017-08-15 ENCOUNTER — Ambulatory Visit: Payer: BLUE CROSS/BLUE SHIELD | Admitting: Orthopaedic Surgery

## 2017-08-20 ENCOUNTER — Ambulatory Visit (INDEPENDENT_AMBULATORY_CARE_PROVIDER_SITE_OTHER): Payer: BLUE CROSS/BLUE SHIELD | Admitting: Orthopaedic Surgery

## 2017-08-20 ENCOUNTER — Encounter: Payer: Self-pay | Admitting: Orthopaedic Surgery

## 2017-08-20 VITALS — BP 112/70 | HR 77 | Ht 67.0 in | Wt 175.0 lb

## 2017-08-20 DIAGNOSIS — M79641 Pain in right hand: Secondary | ICD-10-CM

## 2017-08-20 DIAGNOSIS — I10 Essential (primary) hypertension: Secondary | ICD-10-CM

## 2017-08-20 DIAGNOSIS — M79642 Pain in left hand: Secondary | ICD-10-CM

## 2017-08-20 DIAGNOSIS — R768 Other specified abnormal immunological findings in serum: Secondary | ICD-10-CM | POA: Diagnosis not present

## 2017-08-20 DIAGNOSIS — F17210 Nicotine dependence, cigarettes, uncomplicated: Secondary | ICD-10-CM

## 2017-08-20 NOTE — Patient Instructions (Signed)
Steps to Quit Smoking Smoking tobacco can be bad for your health. It can also affect almost every organ in your body. Smoking puts you and people around you at risk for many serious Tyliah Schlereth-lasting (chronic) diseases. Quitting smoking is hard, but it is one of the best things that you can do for your health. It is never too late to quit. What are the benefits of quitting smoking? When you quit smoking, you lower your risk for getting serious diseases and conditions. They can include:  Lung cancer or lung disease.  Heart disease.  Stroke.  Heart attack.  Not being able to have children (infertility).  Weak bones (osteoporosis) and broken bones (fractures).  If you have coughing, wheezing, and shortness of breath, those symptoms may get better when you quit. You may also get sick less often. If you are pregnant, quitting smoking can help to lower your chances of having a baby of low birth weight. What can I do to help me quit smoking? Talk with your doctor about what can help you quit smoking. Some things you can do (strategies) include:  Quitting smoking totally, instead of slowly cutting back how much you smoke over a period of time.  Going to in-person counseling. You are more likely to quit if you go to many counseling sessions.  Using resources and support systems, such as: ? Online chats with a counselor. ? Phone quitlines. ? Printed self-help materials. ? Support groups or group counseling. ? Text messaging programs. ? Mobile phone apps or applications.  Taking medicines. Some of these medicines may have nicotine in them. If you are pregnant or breastfeeding, do not take any medicines to quit smoking unless your doctor says it is okay. Talk with your doctor about counseling or other things that can help you.  Talk with your doctor about using more than one strategy at the same time, such as taking medicines while you are also going to in-person counseling. This can help make  quitting easier. What things can I do to make it easier to quit? Quitting smoking might feel very hard at first, but there is a lot that you can do to make it easier. Take these steps:  Talk to your family and friends. Ask them to support and encourage you.  Call phone quitlines, reach out to support groups, or work with a counselor.  Ask people who smoke to not smoke around you.  Avoid places that make you want (trigger) to smoke, such as: ? Bars. ? Parties. ? Smoke-break areas at work.  Spend time with people who do not smoke.  Lower the stress in your life. Stress can make you want to smoke. Try these things to help your stress: ? Getting regular exercise. ? Deep-breathing exercises. ? Yoga. ? Meditating. ? Doing a body scan. To do this, close your eyes, focus on one area of your body at a time from head to toe, and notice which parts of your body are tense. Try to relax the muscles in those areas.  Download or buy apps on your mobile phone or tablet that can help you stick to your quit plan. There are many free apps, such as QuitGuide from the CDC (Centers for Disease Control and Prevention). You can find more support from smokefree.gov and other websites.  This information is not intended to replace advice given to you by your health care provider. Make sure you discuss any questions you have with your health care provider. Document Released: 01/13/2009 Document   Revised: 11/15/2015 Document Reviewed: 08/03/2014 Elsevier Interactive Patient Education  2018 Elsevier Inc.  

## 2017-08-20 NOTE — Progress Notes (Signed)
Patient Amber Drake, female DOB:01-Nov-1960, 57 y.o. [id number]  Chief Complaint  Patient presents with  . Follow-up    Hand Pain    HPI  Amber Drake is a 57 y.o. female who has hand pain that is not getting any better.  She was to see a rheumatologist but has not been able to do so this time because of a change in her insurance. She has pain most of the time.  She has sarcoidosis in the family and I would like her to see the rheumatologist. HPI  Body mass index is 27.41 kg/m.  ROS  Review of Systems  HENT: Negative for congestion.   Respiratory: Negative for cough and shortness of breath.   Cardiovascular: Negative for chest pain and leg swelling.  Endocrine: Positive for cold intolerance.  Musculoskeletal: Positive for arthralgias.  Allergic/Immunologic: Positive for environmental allergies.  All other systems reviewed and are negative.   Past Medical History:  Diagnosis Date  . Anemia   . Arthritis   . Blood dyscrasia    sickle cell trait  . Carbuncle and furuncle   . Hypercholesterolemia   . Hypertension    does not take meds  . Sickle cell trait [facility])     Past Surgical History:  Procedure Laterality Date  . APPENDECTOMY    . CARPAL TUNNEL RELEASE  03/23/2011   Procedure: CARPAL TUNNEL RELEASE;  Surgeon: Sanjuana Kava;  Location: [facility];  Service: Orthopedics;  Laterality: Left;  . CARPAL TUNNEL RELEASE  05/10/2011   Procedure: CARPAL TUNNEL RELEASE;  Surgeon: Sanjuana Kava, MD;  Location: [facility];  Service: Orthopedics;  Laterality: Right;  . CESAREAN SECTION     x 2  . INCISION AND DRAINAGE PERIRECTAL ABSCESS  12/21/09  . PROCTOSCOPY  10/17/2010   Procedure: PROCTOSCOPY;  Surgeon: Scherry Ran;  Location: [facility];  Service: General;  Laterality: N/A;  Rigid Proctoscopy/Possible Fistula in Ano  Procedure ended at 1003  . THERAPEUTIC ABORTION     x2    Family History  Problem Relation Age of Onset  . Kidney failure Daughter   . Sickle  cell anemia Daughter   . Sickle cell trait Daughter   . Sickle cell anemia Son   . Hypertension Mother   . Hyperlipidemia Mother   . Hypertension Father   . Lupus Father        skin  . Hypertension Other   . Sarcoidosis Sister   . Anesthesia problems Neg Hx   . Hypotension Neg Hx   . Malignant hyperthermia Neg Hx   . Pseudochol deficiency Neg Hx     Social History Social History   Tobacco Use  . Smoking status: Current Every Day Smoker    Packs/day: 0.50    Years: 30.00    Pack years: 15.00    Types: Cigarettes  . Smokeless tobacco: Never Used  Substance Use Topics  . Alcohol use: No  . 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 atorvastatin (LIPITOR) 20 MG tablet Take 1 tablet (20 mg total) by mouth daily. 90 tablet 2  . benzonatate (TESSALON) 100 MG capsule Take 1 capsule (100 mg total) by mouth every 8 (eight) hours. (Patient not taking: Reported on 05/21/2017) 21 capsule 0  . cyclobenzaprine (FLEXERIL) 10 MG tablet Take 1 tablet (10 mg total) by mouth 3 (three) times daily as needed for muscle spasms.  40 tablet 0  . ferrous sulfate 325 (65 FE) MG tablet Take 325 mg by mouth daily with breakfast.    . HYDROcodone-acetaminophen (NORCO) 7.5-325 MG tablet Take 1 tablet by mouth every 6 (six) hours as needed for moderate pain (Must last 30 days). 40 tablet 0  . lisinopril-hydrochlorothiazide (PRINZIDE,ZESTORETIC) 20-12.5 MG tablet Take 1 tablet by mouth daily.    . Multiple Vitamin (MULTIVITAMIN WITH MINERALS) TABS tablet Take 1 tablet by mouth daily.    . nabumetone (RELAFEN) 750 MG tablet Take 1 tablet (750 mg total) by mouth 2 (two) times daily. One by mouth twice a day after eating. 60 tablet 5  . potassium chloride SA (K-DUR,KLOR-CON) 20 MEQ tablet Take one tablet twice a day for five days, then one tablet a day 35 tablet 0   No current facility-administered medications for this visit.       Physical Exam  Blood pressure 112/70, pulse 77, height 5\' 7"  (1.702 m), weight 175 lb (79.4 kg), last menstrual period 08/12/2010.  Constitutional: overall normal hygiene, normal nutrition, well developed, normal grooming, normal body habitus. Assistive device:none  Musculoskeletal: gait and station Limp none, muscle tone and strength are normal, no tremors or atrophy is present.  .  Neurological: coordination overall normal.  Deep tendon reflex/nerve stretch intact.  Sensation normal.  Cranial nerves II-XII intact.   Skin:   Normal overall no scars, lesions, ulcers or rashes. No psoriasis.  Psychiatric: Alert and oriented x 3.  Recent memory intact, remote memory unclear.  Normal mood and affect. Well groomed.  Good eye contact.  Cardiovascular: overall no swelling, no varicosities, no edema bilaterally, normal temperatures of the legs and arms, no clubbing, cyanosis and good capillary refill.  Lymphatic: palpation is normal.  Both hand have full ROM today and no swelling.  NV intact.   All other systems reviewed and are negative   The patient has been educated about the nature of the problem(s) and counseled on treatment options.  The patient appeared to understand what I have discussed and is in agreement with it.  Encounter Diagnoses  Name Primary?  . Hand pain, right Yes  . Pain of left hand   . Positive ANA (antinuclear antibody)   . Essential hypertension, benign   . Cigarette nicotine dependence without complication     PLAN Call if any problems.  Precautions discussed.  Continue current medications.   Return to clinic 1 month   Electronically Signed Sanjuana Kava, MD 5/21/201910:01 AM

## 2017-08-27 ENCOUNTER — Telehealth: Payer: Self-pay | Admitting: Orthopaedic Surgery

## 2017-08-27 NOTE — Telephone Encounter (Signed)
Hydrocodone-Acetaminophen  7.5/325 mg  Qty 40 Tablets  Patient states you told her to remind [place] that you was going to increase her quantity.  PATIENT USES [facility]

## 2017-08-28 MED ORDER — HYDROCODONE-ACETAMINOPHEN 7.5-325 MG PO TABS: 1.0000 | ORAL_TABLET | Freq: Four times a day (QID) | ORAL | 0 refills | Status: DC | PRN

## 2017-09-09 DIAGNOSIS — F17200 Nicotine dependence, unspecified, uncomplicated: Secondary | ICD-10-CM | POA: Diagnosis not present

## 2017-09-09 DIAGNOSIS — Z111 Encounter for screening for respiratory tuberculosis: Secondary | ICD-10-CM | POA: Diagnosis not present

## 2017-09-09 DIAGNOSIS — J40 Bronchitis, not specified as acute or chronic: Secondary | ICD-10-CM | POA: Diagnosis not present

## 2017-09-09 DIAGNOSIS — F17210 Nicotine dependence, cigarettes, uncomplicated: Secondary | ICD-10-CM | POA: Diagnosis not present

## 2017-09-17 ENCOUNTER — Ambulatory Visit: Payer: BLUE CROSS/BLUE SHIELD | Admitting: Orthopaedic Surgery

## 2017-09-18 ENCOUNTER — Encounter: Payer: Self-pay | Admitting: Orthopaedic Surgery

## 2017-09-18 ENCOUNTER — Ambulatory Visit (INDEPENDENT_AMBULATORY_CARE_PROVIDER_SITE_OTHER): Payer: BLUE CROSS/BLUE SHIELD | Admitting: Orthopaedic Surgery

## 2017-09-18 VITALS — BP 109/73 | HR 84 | Ht 67.0 in | Wt 175.0 lb

## 2017-09-18 DIAGNOSIS — M79641 Pain in right hand: Secondary | ICD-10-CM | POA: Diagnosis not present

## 2017-09-18 DIAGNOSIS — R768 Other specified abnormal immunological findings in serum: Secondary | ICD-10-CM | POA: Diagnosis not present

## 2017-09-18 DIAGNOSIS — M79642 Pain in left hand: Secondary | ICD-10-CM

## 2017-09-18 DIAGNOSIS — F17210 Nicotine dependence, cigarettes, uncomplicated: Secondary | ICD-10-CM | POA: Diagnosis not present

## 2017-09-18 NOTE — Progress Notes (Signed)
Patient Amber Drake, female DOB:[birth year], 57 y.o. VQQ:[id number]  Chief Complaint  Patient presents with  . Follow-up    Right hand pain    HPI  Amber Drake is a 57 y.o. female who has continued right hand pain and tenderness.  She has more pain in the morning.  She has no trauma.  She has no color changes.  I have been trying to get her to see a rheumatologist.  She has had insurance issues and hopefully this can be resolved soon.  There is a family history of sarcoidosis. HPI  Body mass index is 27.41 kg/m.  ROS  Review of Systems  HENT: Negative for congestion.   Respiratory: Negative for cough and shortness of breath.   Cardiovascular: Negative for chest pain and leg swelling.  Endocrine: Positive for cold intolerance.  Musculoskeletal: Positive for arthralgias.  Allergic/Immunologic: Positive for environmental allergies.  All other systems reviewed and are negative.   Past Medical History:  Diagnosis Date  . Anemia   . Arthritis   . Blood dyscrasia    sickle cell trait  . Carbuncle and furuncle   . Hypercholesterolemia   . Hypertension    does not take meds  . Sickle cell trait [facility])     Past Surgical History:  Procedure Laterality Date  . APPENDECTOMY    . CARPAL TUNNEL RELEASE  03/23/2011   Procedure: CARPAL TUNNEL RELEASE;  Surgeon: Sanjuana Kava;  Location: [facility];  Service: Orthopedics;  Laterality: Left;  . CARPAL TUNNEL RELEASE  05/10/2011   Procedure: CARPAL TUNNEL RELEASE;  Surgeon: Sanjuana Kava, MD;  Location: [facility];  Service: Orthopedics;  Laterality: Right;  . CESAREAN SECTION     x 2  . INCISION AND DRAINAGE PERIRECTAL ABSCESS  12/21/09  . PROCTOSCOPY  10/17/2010   Procedure: PROCTOSCOPY;  Surgeon: Scherry Ran;  Location: [facility];  Service: General;  Laterality: N/A;  Rigid Proctoscopy/Possible Fistula in Ano  Procedure ended at 1003  . THERAPEUTIC ABORTION     x2    Family History  Problem Relation Age of Onset  . Kidney  failure Daughter   . Sickle cell anemia Daughter   . Sickle cell trait Daughter   . Sickle cell anemia Son   . Hypertension Mother   . Hyperlipidemia Mother   . Hypertension Father   . Lupus Father        skin  . Hypertension Other   . Sarcoidosis Sister   . Anesthesia problems Neg Hx   . Hypotension Neg Hx   . Malignant hyperthermia Neg Hx   . Pseudochol deficiency Neg Hx     Social History Social History   Tobacco Use  . Smoking status: Current Every Day Smoker    Packs/day: 0.50    Years: 30.00    Pack years: 15.00    Types: Cigarettes  . Smokeless tobacco: Never Used  Substance Use Topics  . Alcohol use: No  . 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 atorvastatin (LIPITOR) 20 MG tablet Take 1 tablet (20 mg total) by mouth daily. 90 tablet 2  . benzonatate (TESSALON) 100 MG capsule Take 1 capsule (100 mg total) by mouth every 8 (eight) hours. (Patient not taking: Reported on 05/21/2017) 21 capsule 0  . cyclobenzaprine (FLEXERIL) 10 MG tablet Take 1 tablet (10 mg total) by mouth 3 (three) times daily as  needed for muscle spasms. 40 tablet 0  . ferrous sulfate 325 (65 FE) MG tablet Take 325 mg by mouth daily with breakfast.    . HYDROcodone-acetaminophen (NORCO) 7.5-325 MG tablet Take 1 tablet by mouth every 6 (six) hours as needed for moderate pain (Must last 30 days). 50 tablet 0  . lisinopril-hydrochlorothiazide (PRINZIDE,ZESTORETIC) 20-12.5 MG tablet Take 1 tablet by mouth daily.    . Multiple Vitamin (MULTIVITAMIN WITH MINERALS) TABS tablet Take 1 tablet by mouth daily.    . nabumetone (RELAFEN) 750 MG tablet Take 1 tablet (750 mg total) by mouth 2 (two) times daily. One by mouth twice a day after eating. 60 tablet 5  . potassium chloride SA (K-DUR,KLOR-CON) 20 MEQ tablet Take one tablet twice a day for five days, then one tablet a day 35 tablet 0   No current facility-administered  medications for this visit.      Physical Exam  Blood pressure 109/73, pulse 84, height 5\' 7"  (1.702 m), weight 175 lb (79.4 kg), last menstrual period 08/12/2010.  Constitutional: overall normal hygiene, normal nutrition, well developed, normal grooming, normal body habitus. Assistive device:none  Musculoskeletal: gait and station Limp none, muscle tone and strength are normal, no tremors or atrophy is present.  .  Neurological: coordination overall normal.  Deep tendon reflex/nerve stretch intact.  Sensation normal.  Cranial nerves II-XII intact.   Skin:   Normal overall no scars, lesions, ulcers or rashes. No psoriasis.  Psychiatric: Alert and oriented x 3.  Recent memory intact, remote memory unclear.  Normal mood and affect. Well groomed.  Good eye contact.  Cardiovascular: overall no swelling, no varicosities, no edema bilaterally, normal temperatures of the legs and arms, no clubbing, cyanosis and good capillary refill.  Lymphatic: palpation is normal.  Right hand is tender today, more of the ring finger volar and the little finger.  The little finger on the radial side of the volar side is a small cyst, not red, not fluctuant.  NV intact.  She has good ROM of the fingers.  The ring finger is very tender.  All other systems reviewed and are negative   The patient has been educated about the nature of the problem(s) and counseled on treatment options.  The patient appeared to understand what I have discussed and is in agreement with it.  Encounter Diagnoses  Name Primary?  . Hand pain, right Yes  . Pain of left hand   . Positive ANA (antinuclear antibody)   . Cigarette nicotine dependence without complication     PLAN Call if any problems.  Precautions discussed.  Continue current medications.   Return to clinic 6 weeks   Electronically Signed Sanjuana Kava, MD 6/19/201910:25 AM

## 2017-09-26 ENCOUNTER — Telehealth: Payer: Self-pay | Admitting: Orthopaedic Surgery

## 2017-09-26 MED ORDER — HYDROCODONE-ACETAMINOPHEN 7.5-325 MG PO TABS: 1.0000 | ORAL_TABLET | Freq: Four times a day (QID) | ORAL | 0 refills | Status: DC | PRN

## 2017-09-26 NOTE — Telephone Encounter (Signed)
Patient requests refill on Hydrocodone/Acetaminophen 7.5-325 mgs.    Patient has been getting a quantity of 50 pills but states that she was told to remind [place] when she called in for this prescription that she will be allowed 50 pills + 20 more pills per her discussion with you on her last visit.  Patient states she uses [facility]

## 2017-10-16 ENCOUNTER — Encounter (HOSPITAL_COMMUNITY): Payer: Self-pay

## 2017-10-16 ENCOUNTER — Emergency Department (HOSPITAL_COMMUNITY)
Admission: EM | Admit: 2017-10-16 | Discharge: 2017-10-16 | Disposition: A | Discharge status: Home / Self Care | Payer: BLUE CROSS/BLUE SHIELD | Attending: Emergency Medicine | Admitting: Emergency Medicine

## 2017-10-16 ENCOUNTER — Other Ambulatory Visit: Payer: Self-pay

## 2017-10-16 ENCOUNTER — Emergency Department (HOSPITAL_COMMUNITY): Payer: BLUE CROSS/BLUE SHIELD

## 2017-10-16 DIAGNOSIS — F17210 Nicotine dependence, cigarettes, uncomplicated: Secondary | ICD-10-CM | POA: Diagnosis not present

## 2017-10-16 DIAGNOSIS — Z79899 Other long term (current) drug therapy: Secondary | ICD-10-CM | POA: Insufficient documentation

## 2017-10-16 DIAGNOSIS — J029 Acute pharyngitis, unspecified: Secondary | ICD-10-CM | POA: Insufficient documentation

## 2017-10-16 DIAGNOSIS — I10 Essential (primary) hypertension: Secondary | ICD-10-CM | POA: Insufficient documentation

## 2017-10-16 DIAGNOSIS — R05 Cough: Secondary | ICD-10-CM | POA: Diagnosis not present

## 2017-10-16 DIAGNOSIS — R0981 Nasal congestion: Secondary | ICD-10-CM | POA: Diagnosis not present

## 2017-10-16 DIAGNOSIS — R509 Fever, unspecified: Secondary | ICD-10-CM | POA: Diagnosis not present

## 2017-10-16 DIAGNOSIS — J069 Acute upper respiratory infection, unspecified: Secondary | ICD-10-CM | POA: Diagnosis not present

## 2017-10-16 LAB — GROUP A STREP BY PCR: Group A Strep by PCR: NOT DETECTED

## 2017-10-16 MED ORDER — IBUPROFEN 400 MG PO TABS
400.0000 mg | ORAL_TABLET | Freq: Once | ORAL | Status: AC
  Administered 2017-10-16: 400 mg via ORAL
  Filled 2017-10-16: qty 1

## 2017-10-16 MED ORDER — ACETAMINOPHEN 500 MG PO TABS
1000.0000 mg | ORAL_TABLET | Freq: Once | ORAL | Status: AC
  Administered 2017-10-16: 1000 mg via ORAL
  Filled 2017-10-16: qty 2

## 2017-10-16 NOTE — ED Notes (Signed)
Pt returned from xray

## 2017-10-16 NOTE — ED Notes (Signed)
Pt reports mild sorethroat, body aches and fever. Child recently treated for strep

## 2017-10-16 NOTE — ED Triage Notes (Signed)
Pt reports cough, sore throat, body aches, and chills.  Reports her daughter was diagnosed with strep a few days ago.

## 2017-10-16 NOTE — Discharge Instructions (Addendum)
Your strep test is negative.  Your chest x-ray is also negative for acute problem.  I suspect that you have a viral upper respiratory infection.  Please wash hands frequently.  Usual mask until symptoms have resolved.  Please increase fluids.  Use Tylenol every 4 hours, or ibuprofen every 6 hours for fever and aching.  Salt water gargles and Chloraseptic spray will be helpful.  Please see Dr. Legrand Rams for additional evaluation if not improving.

## 2017-10-16 NOTE — ED Provider Notes (Signed)
[facility] Provider Note   CSN: [id number] Arrival date & time: 10/16/17  0736     History   Chief Complaint Chief Complaint  Patient presents with  . Sore Throat  . Generalized Body Aches    HPI Amber Drake is a 57 y.o. female.  The history is provided by the patient.  Sore Throat  This is a new problem. The current episode started 2 days ago. The problem occurs constantly. The problem has been gradually worsening. Associated symptoms include headaches. Pertinent negatives include no chest pain, no abdominal pain and no shortness of breath. Associated symptoms comments: Wheezing and left ear pain. Nothing aggravates the symptoms. Nothing relieves the symptoms. She has tried acetaminophen for the symptoms. The treatment provided no relief.    Past Medical History:  Diagnosis Date  . Anemia   . Arthritis   . Blood dyscrasia    sickle cell trait  . Carbuncle and furuncle   . Hypercholesterolemia   . Hypertension    does not take meds  . Sickle cell trait [facility])     Patient Active Problem List   Diagnosis Date Noted  . Hidradenitis 07/31/2016  . Recurrent boils 07/31/2016  . Hyperlipidemia 06/15/2015  . Pain of left hand 05/19/2015  . Hand pain, right 05/19/2015  . Hematuria 03/03/2015  . Esophageal reflux 03/03/2015  . Essential hypertension, benign 03/03/2015  . Bronchitis due to tobacco use ([place]) 10/17/2010    Past Surgical History:  Procedure Laterality Date  . APPENDECTOMY    . CARPAL TUNNEL RELEASE  03/23/2011   Procedure: CARPAL TUNNEL RELEASE;  Surgeon: Sanjuana Kava;  Location: [facility];  Service: Orthopedics;  Laterality: Left;  . CARPAL TUNNEL RELEASE  05/10/2011   Procedure: CARPAL TUNNEL RELEASE;  Surgeon: Sanjuana Kava, MD;  Location: [facility];  Service: Orthopedics;  Laterality: Right;  . CESAREAN SECTION     x 2  . INCISION AND DRAINAGE PERIRECTAL ABSCESS  12/21/09  . PROCTOSCOPY  10/17/2010   Procedure: PROCTOSCOPY;   Surgeon: Scherry Ran;  Location: [facility];  Service: General;  Laterality: N/A;  Rigid Proctoscopy/Possible Fistula in Ano  Procedure ended at 1003  . THERAPEUTIC ABORTION     x2     OB History    Gravida  4   Para  2   Term  2   Preterm      AB  2   Living  2     SAB      TAB      Ectopic      Multiple      Live Births               Home Medications    Prior to Admission medications   Medication Sig Start Date End Date Taking? Authorizing Provider  atorvastatin (LIPITOR) 20 MG tablet Take 1 tablet (20 mg total) by mouth daily. 03/14/15   Soyla Dryer, PA-C  benzonatate (TESSALON) 100 MG capsule Take 1 capsule (100 mg total) by mouth every 8 (eight) hours. Patient not taking: Reported on 05/21/2017 12/21/16   Avie Echevaria B, PA-C  cyclobenzaprine (FLEXERIL) 10 MG tablet Take 1 tablet (10 mg total) by mouth 3 (three) times daily as needed for muscle spasms. 11/07/16   Sanjuana Kava, MD  ferrous sulfate 325 (65 FE) MG tablet Take 325 mg by mouth daily with breakfast.    [provider]  HYDROcodone-acetaminophen (NORCO) 7.5-325 MG tablet Take 1 tablet by mouth every  6 (six) hours as needed for moderate pain (Must last 30 days). 09/26/17   Sanjuana Kava, MD  lisinopril-hydrochlorothiazide (PRINZIDE,ZESTORETIC) 20-12.5 MG tablet Take 1 tablet by mouth daily.    [provider]  Multiple Vitamin (MULTIVITAMIN WITH MINERALS) TABS tablet Take 1 tablet by mouth daily.    [provider]  nabumetone (RELAFEN) 750 MG tablet Take 1 tablet (750 mg total) by mouth 2 (two) times daily. One by mouth twice a day after eating. 11/07/16   Sanjuana Kava, MD  potassium chloride SA (K-DUR,KLOR-CON) 20 MEQ tablet Take one tablet twice a day for five days, then one tablet a day 2/68/34   Delora Fuel, MD    Family History Family History  Problem Relation Age of Onset  . Kidney failure Daughter   . Sickle cell anemia Daughter   . Sickle cell  trait Daughter   . Sickle cell anemia Son   . Hypertension Mother   . Hyperlipidemia Mother   . Hypertension Father   . Lupus Father        skin  . Hypertension Other   . Sarcoidosis Sister   . Anesthesia problems Neg Hx   . Hypotension Neg Hx   . Malignant hyperthermia Neg Hx   . Pseudochol deficiency Neg Hx     Social History Social History   Tobacco Use  . Smoking status: Current Every Day Smoker    Packs/day: 0.50    Years: 30.00    Pack years: 15.00    Types: Cigarettes  . Smokeless tobacco: Never Used  Substance Use Topics  . Alcohol use: No  . 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       All ROS Neg except as noted in HPI  HENT: Positive for congestion, postnasal drip and sore throat. Negative for nosebleeds.   Eyes: Negative for photophobia and discharge.  Respiratory: Positive for cough and wheezing. Negative for shortness of breath.   Cardiovascular: Negative for chest pain and palpitations.  Gastrointestinal: Negative for abdominal pain and blood in stool.  Genitourinary: Negative for dysuria, frequency and hematuria.  Musculoskeletal: Negative for arthralgias, back pain and neck pain.  Skin: Negative.   Neurological: Positive for headaches. Negative for dizziness, seizures and speech difficulty.  Psychiatric/Behavioral: Negative for confusion and hallucinations.     Physical Exam Updated Vital Signs BP 132/82 (BP Location: Right Arm)   Pulse (!) 114   Temp 100.3 F (37.9 C) (Oral)   Resp 20   Ht 5\' 6"  (1.676 m)   Wt 77.6 kg (171 lb)   LMP 08/12/2010   SpO2 97%   BMI 27.60 kg/m   Physical Exam  Constitutional: She is oriented to person, place, and time. She appears well-developed and well-nourished.  Non-toxic appearance.  HENT:  Head:  Normocephalic.  Right Ear: Tympanic membrane, external ear and ear canal normal.  Left Ear: Tympanic membrane, external ear and ear canal normal.  Mouth/Throat: Uvula is midline. Uvula swelling present. Posterior oropharyngeal erythema present. No tonsillar abscesses.  Nasal congestion present.  Eyes: Pupils are equal, round, and reactive to light. EOM and lids are normal.  Neck: Normal range of motion. Neck supple. Carotid bruit is not present.  Neck soreness. No palpable lymph nodes  Cardiovascular: Regular rhythm, normal heart sounds, intact  distal pulses and normal pulses. Tachycardia present. Exam reveals no gallop and no friction rub.  No murmur heard. Pulmonary/Chest: Breath sounds normal. No respiratory distress.  Lungs clear. Symmetrical rise and fall of the chest. Pt had dose from inhaler before coming to ED.  Abdominal: Soft. Bowel sounds are normal. There is no tenderness. There is no guarding.  Musculoskeletal: Normal range of motion.  Lymphadenopathy:       Head (right side): No submandibular adenopathy present.       Head (left side): No submandibular adenopathy present.    She has no cervical adenopathy.  Neurological: She is alert and oriented to person, place, and time. She has normal strength. No cranial nerve deficit or sensory deficit.  Skin: Skin is warm and dry. No rash noted.  Psychiatric: She has a normal mood and affect. Her speech is normal.  Nursing note and vitals reviewed.    ED Treatments / Results  Labs (all labs ordered are listed, but only abnormal results are displayed) Labs Reviewed  GROUP A STREP BY PCR    EKG None  Radiology No results found.  Procedures Procedures (including critical care time)  Medications Ordered in ED Medications - No data to display   Initial Impression / Assessment and Plan / ED Course  I have reviewed the triage vital signs and the nursing notes.  Pertinent labs & imaging results that were available during  my care of the patient were reviewed by me and considered in my medical decision making (see chart for details).       Final Clinical Impressions(s) / ED Diagnoses MDM  Vital signs reviewed.  The examination favors an upper respiratory infection.  There is no evidence for hand-foot-and-mouth disease, no evidence for any nuchal rigidity or meningitis.  No evidence of any septic joints.  Patient is awake and alert.  Ambulatory without problem.  Patient able to drink liquids in the emergency department.  I have asked the patient to use salt water gargles, and Tylenol every 4 hours for soreness.  The patient is asked to wash hands frequently.  She will see the primary physician or return to the emergency department if not improving.    Final diagnoses:  Pharyngitis, unspecified etiology  Upper respiratory tract infection, unspecified type    ED Discharge Orders    None       Lily Kocher, PA-C 10/18/17 [place], [place], DO 10/20/17 1737

## 2017-10-24 ENCOUNTER — Telehealth: Payer: Self-pay | Admitting: Orthopaedic Surgery

## 2017-10-24 MED ORDER — HYDROCODONE-ACETAMINOPHEN 7.5-325 MG PO TABS: 1.0000 | ORAL_TABLET | Freq: Four times a day (QID) | ORAL | 0 refills | Status: DC | PRN

## 2017-10-24 NOTE — Telephone Encounter (Signed)
Hydrocodone-Acetaminophen  7.5/325 mg  Qty 50 Tablets  PATIENT USES [facility]

## 2017-10-30 ENCOUNTER — Ambulatory Visit: Payer: BLUE CROSS/BLUE SHIELD | Admitting: Orthopaedic Surgery

## 2017-11-06 ENCOUNTER — Encounter: Payer: Self-pay | Admitting: Orthopaedic Surgery

## 2017-11-06 ENCOUNTER — Ambulatory Visit (INDEPENDENT_AMBULATORY_CARE_PROVIDER_SITE_OTHER): Payer: BLUE CROSS/BLUE SHIELD | Admitting: Orthopaedic Surgery

## 2017-11-06 VITALS — BP 117/70 | HR 80 | Temp 98.2°F | Ht 66.0 in | Wt 167.0 lb

## 2017-11-06 DIAGNOSIS — R768 Other specified abnormal immunological findings in serum: Secondary | ICD-10-CM | POA: Diagnosis not present

## 2017-11-06 DIAGNOSIS — F17210 Nicotine dependence, cigarettes, uncomplicated: Secondary | ICD-10-CM | POA: Diagnosis not present

## 2017-11-06 DIAGNOSIS — M79642 Pain in left hand: Secondary | ICD-10-CM | POA: Diagnosis not present

## 2017-11-06 DIAGNOSIS — I10 Essential (primary) hypertension: Secondary | ICD-10-CM

## 2017-11-06 DIAGNOSIS — M79641 Pain in right hand: Secondary | ICD-10-CM

## 2017-11-06 NOTE — Patient Instructions (Signed)
Steps to Quit Smoking Smoking tobacco can be bad for your health. It can also affect almost every organ in your body. Smoking puts you and people around you at risk for many serious long-lasting (chronic) diseases. Quitting smoking is hard, but it is one of the best things that you can do for your health. It is never too late to quit. What are the benefits of quitting smoking? When you quit smoking, you lower your risk for getting serious diseases and conditions. They can include:  Lung cancer or lung disease.  Heart disease.  Stroke.  Heart attack.  Not being able to have children (infertility).  Weak bones (osteoporosis) and broken bones (fractures).  If you have coughing, wheezing, and shortness of breath, those symptoms may get better when you quit. You may also get sick less often. If you are pregnant, quitting smoking can help to lower your chances of having a baby of low birth weight. What can I do to help me quit smoking? Talk with your doctor about what can help you quit smoking. Some things you can do (strategies) include:  Quitting smoking totally, instead of slowly cutting back how much you smoke over a period of time.  Going to in-person counseling. You are more likely to quit if you go to many counseling sessions.  Using resources and support systems, such as: ? Online chats with a counselor. ? Phone quitlines. ? Printed self-help materials. ? Support groups or group counseling. ? Text messaging programs. ? Mobile phone apps or applications.  Taking medicines. Some of these medicines may have nicotine in them. If you are pregnant or breastfeeding, do not take any medicines to quit smoking unless your doctor says it is okay. Talk with your doctor about counseling or other things that can help you.  Talk with your doctor about using more than one strategy at the same time, such as taking medicines while you are also going to in-person counseling. This can help make  quitting easier. What things can I do to make it easier to quit? Quitting smoking might feel very hard at first, but there is a lot that you can do to make it easier. Take these steps:  Talk to your family and friends. Ask them to support and encourage you.  Call phone quitlines, reach out to support groups, or work with a counselor.  Ask people who smoke to not smoke around you.  Avoid places that make you want (trigger) to smoke, such as: ? Bars. ? Parties. ? Smoke-break areas at work.  Spend time with people who do not smoke.  Lower the stress in your life. Stress can make you want to smoke. Try these things to help your stress: ? Getting regular exercise. ? Deep-breathing exercises. ? Yoga. ? Meditating. ? Doing a body scan. To do this, close your eyes, focus on one area of your body at a time from head to toe, and notice which parts of your body are tense. Try to relax the muscles in those areas.  Download or buy apps on your mobile phone or tablet that can help you stick to your quit plan. There are many free apps, such as QuitGuide from the CDC (Centers for Disease Control and Prevention). You can find more support from smokefree.gov and other websites.  This information is not intended to replace advice given to you by your health care provider. Make sure you discuss any questions you have with your health care provider. Document Released: 01/13/2009 Document   Revised: 11/15/2015 Document Reviewed: 08/03/2014 Elsevier Interactive Patient Education  2018 Elsevier Inc.  

## 2017-11-06 NOTE — Progress Notes (Signed)
Patient Amber [facility], female DOB:Jul 19, 1960, 57 y.o. [id number]  Chief Complaint  Patient presents with  . Hand Pain    right     HPI  Amber [facility] is a 57 y.o. female who continues to have pain in the hands.  Her twin sister has sarcoidosis. She has been on pain medicine and NSAIDs with little help.  I would like to have her seen by rheumatologist.  She has positive ANA.   Body mass index is 26.95 kg/m.  ROS  Review of Systems  HENT: Negative for congestion.   Respiratory: Negative for cough and shortness of breath.   Cardiovascular: Negative for chest pain and leg swelling.  Endocrine: Positive for cold intolerance.  Musculoskeletal: Positive for arthralgias.  Allergic/Immunologic: Positive for environmental allergies.  All other systems reviewed and are negative.   All other systems reviewed and are negative.  Past Medical History:  Diagnosis Date  . Anemia   . Arthritis   . Blood dyscrasia    sickle cell trait  . Carbuncle and furuncle   . Hypercholesterolemia   . Hypertension    does not take meds  . Sickle cell trait [facility])     Past Surgical History:  Procedure Laterality Date  . APPENDECTOMY    . CARPAL TUNNEL RELEASE  03/23/2011   Procedure: CARPAL TUNNEL RELEASE;  Surgeon: Sanjuana Kava;  Location: [facility];  Service: Orthopedics;  Laterality: Left;  . CARPAL TUNNEL RELEASE  05/10/2011   Procedure: CARPAL TUNNEL RELEASE;  Surgeon: Sanjuana Kava, MD;  Location: [facility];  Service: Orthopedics;  Laterality: Right;  . CESAREAN SECTION     x 2  . INCISION AND DRAINAGE PERIRECTAL ABSCESS  12/21/09  . PROCTOSCOPY  10/17/2010   Procedure: PROCTOSCOPY;  Surgeon: Scherry Ran;  Location: [facility];  Service: General;  Laterality: N/A;  Rigid Proctoscopy/Possible Fistula in Ano  Procedure ended at 1003  . THERAPEUTIC ABORTION     x2    Family History  Problem Relation Age of Onset  . Kidney failure Daughter   . Sickle cell anemia Daughter   .  Sickle cell trait Daughter   . Sickle cell anemia Son   . Hypertension Mother   . Hyperlipidemia Mother   . Hypertension Father   . Lupus Father        skin  . Hypertension Other   . Sarcoidosis Sister   . Anesthesia problems Neg Hx   . Hypotension Neg Hx   . Malignant hyperthermia Neg Hx   . Pseudochol deficiency Neg Hx     Social History Social History   Tobacco Use  . Smoking status: Current Every Day Smoker    Packs/day: 0.50    Years: 30.00    Pack years: 15.00    Types: Cigarettes  . Smokeless tobacco: Never Used  Substance Use Topics  . Alcohol use: No  . 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 atorvastatin (LIPITOR) 20 MG tablet Take 1 tablet (20 mg total) by mouth daily. 90 tablet 2  . cyclobenzaprine (FLEXERIL) 10 MG tablet Take 1 tablet (10 mg total) by mouth 3 (three) times daily as needed for muscle spasms. (Patient taking differently: Take 10 mg by mouth 2 (two) times daily as needed for muscle spasms. ) 40 tablet 0  . ferrous sulfate 325 (65 FE) MG tablet Take 325 mg by mouth daily  with breakfast.    . HYDROcodone-acetaminophen (NORCO) 7.5-325 MG tablet Take 1 tablet by mouth every 6 (six) hours as needed for moderate pain (Must last 30 days). 50 tablet 0  . lisinopril-hydrochlorothiazide (PRINZIDE,ZESTORETIC) 20-12.5 MG tablet Take 1 tablet by mouth daily.    . Multiple Vitamin (MULTIVITAMIN WITH MINERALS) TABS tablet Take 1 tablet by mouth daily.    . nabumetone (RELAFEN) 750 MG tablet Take 1 tablet (750 mg total) by mouth 2 (two) times daily. One by mouth twice a day after eating. 60 tablet 5  . potassium chloride SA (K-DUR,KLOR-CON) 20 MEQ tablet Take one tablet twice a day for five days, then one tablet a day (Patient not taking: Reported on 10/16/2017) 35 tablet 0   No current facility-administered medications for this visit.      Physical Exam  Blood pressure 117/70,  pulse 80, temperature 98.2 F (36.8 C), height 5\' 6"  (1.676 m), weight 167 lb (75.8 kg), last menstrual period 08/12/2010.  Constitutional: overall normal hygiene, normal nutrition, well developed, normal grooming, normal body habitus. Assistive device:none  Musculoskeletal: gait and station Limp none, muscle tone and strength are normal, no tremors or atrophy is present.  .  Neurological: coordination overall normal.  Deep tendon reflex/nerve stretch intact.  Sensation normal.  Cranial nerves II-XII intact.   Skin:   Normal overall no scars, lesions, ulcers or rashes. No psoriasis.  Psychiatric: Alert and oriented x 3.  Recent memory intact, remote memory unclear.  Normal mood and affect. Well groomed.  Good eye contact.  Cardiovascular: overall no swelling, no varicosities, no edema bilaterally, normal temperatures of the legs and arms, no clubbing, cyanosis and good capillary refill.  Lymphatic: palpation is normal.  Both hands are tender but not swollen.  She has small cyst of the little finger volarly near the MCP joint that is tender.  It is size of small grape seed.  NV is intact. Grips are good.  All other systems reviewed and are negative   The patient has been educated about the nature of the problem(s) and counseled on treatment options.  The patient appeared to understand what I have discussed and is in agreement with it.  Encounter Diagnoses  Name Primary?  . Hand pain, right Yes  . Pain of left hand   . Positive ANA (antinuclear antibody)   . Cigarette nicotine dependence without complication   . Essential hypertension, benign     PLAN Call if any problems.  Precautions discussed.  Continue current medications.   Return to clinic 6 weeks   I am referring to rheumatologist for evaluation.  Electronically Signed Sanjuana Kava, MD 8/7/201910:34 AM

## 2017-11-18 ENCOUNTER — Other Ambulatory Visit (HOSPITAL_COMMUNITY)
Admission: RE | Admit: 2017-11-18 | Discharge: 2017-11-18 | Disposition: A | Discharge status: Home / Self Care | Payer: BLUE CROSS/BLUE SHIELD | Source: Ambulatory Visit | Attending: Internal Medicine | Admitting: Internal Medicine

## 2017-11-18 DIAGNOSIS — F17200 Nicotine dependence, unspecified, uncomplicated: Secondary | ICD-10-CM | POA: Insufficient documentation

## 2017-11-18 DIAGNOSIS — R634 Abnormal weight loss: Secondary | ICD-10-CM | POA: Diagnosis not present

## 2017-11-18 DIAGNOSIS — I10 Essential (primary) hypertension: Secondary | ICD-10-CM | POA: Insufficient documentation

## 2017-11-18 DIAGNOSIS — L0292 Furuncle, unspecified: Secondary | ICD-10-CM | POA: Insufficient documentation

## 2017-11-18 DIAGNOSIS — E785 Hyperlipidemia, unspecified: Secondary | ICD-10-CM | POA: Insufficient documentation

## 2017-11-18 LAB — CBC WITH DIFFERENTIAL/PLATELET
BASOS ABS: 0 10*3/uL (ref 0.0–0.1)
BASOS PCT: 0 %
Eosinophils Absolute: 0.1 10*3/uL (ref 0.0–0.7)
Eosinophils Relative: 1 %
HEMATOCRIT: 34.7 % — AB (ref 36.0–46.0)
HEMOGLOBIN: 11.5 g/dL — AB (ref 12.0–15.0)
LYMPHS PCT: 30 %
Lymphs Abs: 1.9 10*3/uL (ref 0.7–4.0)
MCH: 28.5 pg (ref 26.0–34.0)
MCHC: 33.1 g/dL (ref 30.0–36.0)
MCV: 86.1 fL (ref 78.0–100.0)
MONO ABS: 0.3 10*3/uL (ref 0.1–1.0)
Monocytes Relative: 5 %
NEUTROS ABS: 4 10*3/uL (ref 1.7–7.7)
NEUTROS PCT: 64 %
Platelets: 361 10*3/uL (ref 150–400)
RBC: 4.03 MIL/uL (ref 3.87–5.11)
RDW: 13.4 % (ref 11.5–15.5)
WBC: 6.3 10*3/uL (ref 4.0–10.5)

## 2017-11-18 LAB — BASIC METABOLIC PANEL
ANION GAP: 10 (ref 5–15)
BUN: 10 mg/dL (ref 6–20)
CALCIUM: 9.6 mg/dL (ref 8.9–10.3)
CO2: 28 mmol/L (ref 22–32)
Chloride: 101 mmol/L (ref 98–111)
Creatinine, Ser: 0.75 mg/dL (ref 0.44–1.00)
GLUCOSE: 134 mg/dL — AB (ref 70–99)
POTASSIUM: 2.8 mmol/L — AB (ref 3.5–5.1)
Sodium: 139 mmol/L (ref 135–145)

## 2017-11-19 LAB — HIV ANTIBODY (ROUTINE TESTING W REFLEX): HIV SCREEN 4TH GENERATION: NONREACTIVE

## 2017-11-23 ENCOUNTER — Encounter (HOSPITAL_COMMUNITY): Payer: Self-pay | Admitting: Emergency Medicine

## 2017-11-23 ENCOUNTER — Other Ambulatory Visit: Payer: Self-pay

## 2017-11-23 ENCOUNTER — Emergency Department (HOSPITAL_COMMUNITY)
Admission: EM | Admit: 2017-11-23 | Discharge: 2017-11-23 | Disposition: A | Discharge status: Home / Self Care | Payer: BLUE CROSS/BLUE SHIELD | Attending: Emergency Medicine | Admitting: Emergency Medicine

## 2017-11-23 DIAGNOSIS — F32A Depression, unspecified: Secondary | ICD-10-CM

## 2017-11-23 DIAGNOSIS — I10 Essential (primary) hypertension: Secondary | ICD-10-CM | POA: Insufficient documentation

## 2017-11-23 DIAGNOSIS — F17210 Nicotine dependence, cigarettes, uncomplicated: Secondary | ICD-10-CM | POA: Diagnosis not present

## 2017-11-23 DIAGNOSIS — F329 Major depressive disorder, single episode, unspecified: Secondary | ICD-10-CM | POA: Diagnosis not present

## 2017-11-23 DIAGNOSIS — F439 Reaction to severe stress, unspecified: Secondary | ICD-10-CM | POA: Insufficient documentation

## 2017-11-23 DIAGNOSIS — Z79899 Other long term (current) drug therapy: Secondary | ICD-10-CM | POA: Insufficient documentation

## 2017-11-23 LAB — URINALYSIS, ROUTINE W REFLEX MICROSCOPIC
BILIRUBIN URINE: NEGATIVE
Bacteria, UA: NONE SEEN
Glucose, UA: NEGATIVE mg/dL
KETONES UR: NEGATIVE mg/dL
LEUKOCYTES UA: NEGATIVE
NITRITE: NEGATIVE
PH: 6 (ref 5.0–8.0)
Protein, ur: NEGATIVE mg/dL
Specific Gravity, Urine: 1.013 (ref 1.005–1.030)

## 2017-11-23 NOTE — BH Assessment (Signed)
[facility] Assessment Progress Note Spoke with Dr. Eulis Foster, who would like TTS to talk with pt and give resources, not a full TTS evaluation. Pt denies current SI, HI or AVH and states that she would never do anything that would take her away form her kids, "I love my kids to death". Pt described many recent stressors including the illnesses of her children, getting married in January and her husband leaving her after she took care of his two children. She wants to look into what she can do legally to get her money back from her husband since he left without paying rent, contributing, etc. Provided supportive counseling and education on caregiver stress and its impact. Provided outpatient options and resources and encouraged pt to call TTS or come back to hospital if she feels worse. Pt states that she planning to go to [facility] or her PCP, possible [facility] in [place].She states that IOP would be challenging for her due to her work schedule and taking care of her son at her home.

## 2017-11-23 NOTE — ED Provider Notes (Signed)
[facility] EMERGENCY DEPARTMENT Provider Note   CSN: [id number] Arrival date & time: 11/23/17  1449     History   Chief Complaint Chief Complaint  Patient presents with  . Abdominal Pain    HPI Amber Drake is a 57 y.o. female.  HPI   She is here for evaluation of weight loss, feeling sad, upset about her children, upset about her husband leaving her, and does not feel like she knows what she can do to help herself.  2 of her children have sickle cell anemia and require frequent treatments.  Currently her daughter is in the ICU at [facility] and today the patient visited her.  The doctors taking care of her daughter feel like she might of had a stroke, the patient is very ill at this time.  Patient son was recently discharged from the hospital after treatment for sickle cell anemia.  The patient's husband, with whom she lives, left home after getting his check and reportedly went to the beach with "another girl."  Patient has not been eating well for 2 weeks.  She saw her PCP for same problem 6 days ago, had blood work at that time and was told everything was okay.  She is taking her prescribed medications.  Denies suicidal or homicidal ideation.  There are no other known modifying factors.  Past Medical History:  Diagnosis Date  . Anemia   . Arthritis   . Blood dyscrasia    sickle cell trait  . Carbuncle and furuncle   . Hypercholesterolemia   . Hypertension    does not take meds  . Sickle cell trait [facility])     Patient Active Problem List   Diagnosis Date Noted  . Hidradenitis 07/31/2016  . Recurrent boils 07/31/2016  . Hyperlipidemia 06/15/2015  . Pain of left hand 05/19/2015  . Hand pain, right 05/19/2015  . Hematuria 03/03/2015  . Esophageal reflux 03/03/2015  . Essential hypertension, benign 03/03/2015  . Bronchitis due to tobacco use ([place]) 10/17/2010    Past Surgical History:  Procedure Laterality Date  . APPENDECTOMY    . CARPAL TUNNEL RELEASE   03/23/2011   Procedure: CARPAL TUNNEL RELEASE;  Surgeon: Sanjuana Kava;  Location: [facility];  Service: Orthopedics;  Laterality: Left;  . CARPAL TUNNEL RELEASE  05/10/2011   Procedure: CARPAL TUNNEL RELEASE;  Surgeon: Sanjuana Kava, MD;  Location: [facility];  Service: Orthopedics;  Laterality: Right;  . CESAREAN SECTION     x 2  . INCISION AND DRAINAGE PERIRECTAL ABSCESS  12/21/09  . PROCTOSCOPY  10/17/2010   Procedure: PROCTOSCOPY;  Surgeon: Scherry Ran;  Location: [facility];  Service: General;  Laterality: N/A;  Rigid Proctoscopy/Possible Fistula in Ano  Procedure ended at 1003  . THERAPEUTIC ABORTION     x2     OB History    Gravida  4   Para  2   Term  2   Preterm      AB  2   Living  2     SAB      TAB      Ectopic      Multiple      Live Births               Home Medications    Prior to Admission medications   Medication Sig Start Date End Date Taking? Authorizing Provider  atorvastatin (LIPITOR) 20 MG tablet Take 1 tablet (20 mg total) by mouth daily. 03/14/15  Soyla Dryer, PA-C  cyclobenzaprine (FLEXERIL) 10 MG tablet Take 1 tablet (10 mg total) by mouth 3 (three) times daily as needed for muscle spasms. Patient taking differently: Take 10 mg by mouth 2 (two) times daily as needed for muscle spasms.  11/07/16   Sanjuana Kava, MD  ferrous sulfate 325 (65 FE) MG tablet Take 325 mg by mouth daily with breakfast.    [provider]  HYDROcodone-acetaminophen (NORCO) 7.5-325 MG tablet Take 1 tablet by mouth every 6 (six) hours as needed for moderate pain (Must last 30 days). 10/24/17   Sanjuana Kava, MD  lisinopril-hydrochlorothiazide (PRINZIDE,ZESTORETIC) 20-12.5 MG tablet Take 1 tablet by mouth daily.    [provider]  Multiple Vitamin (MULTIVITAMIN WITH MINERALS) TABS tablet Take 1 tablet by mouth daily.    [provider]  nabumetone (RELAFEN) 750 MG tablet Take 1 tablet (750 mg total) by mouth 2 (two) times daily. One by  mouth twice a day after eating. 11/07/16   Sanjuana Kava, MD  potassium chloride SA (K-DUR,KLOR-CON) 20 MEQ tablet Take one tablet twice a day for five days, then one tablet a day Patient not taking: Reported on 2/67/1245 11/08/96   Delora Fuel, MD    Family History Family History  Problem Relation Age of Onset  . Kidney failure Daughter   . Sickle cell anemia Daughter   . Sickle cell trait Daughter   . Sickle cell anemia Son   . Hypertension Mother   . Hyperlipidemia Mother   . Hypertension Father   . Lupus Father        skin  . Hypertension Other   . Sarcoidosis Sister   . Anesthesia problems Neg Hx   . Hypotension Neg Hx   . Malignant hyperthermia Neg Hx   . Pseudochol deficiency Neg Hx     Social History Social History   Tobacco Use  . Smoking status: Current Every Day Smoker    Packs/day: 0.50    Years: 30.00    Pack years: 15.00    Types: Cigarettes  . Smokeless tobacco: Never Used  Substance Use Topics  . Alcohol use: No  . 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    Physical Exam Updated Vital Signs BP (!) 108/57 (BP Location: Right Arm)   Pulse 76   Temp 98 F (36.7 C) (Oral)   Resp 18   Ht 5\' 6"  (1.676 m)   Wt 75 kg   LMP 08/12/2010   SpO2 100%   BMI 26.68 kg/m   Physical Exam  Constitutional: She is oriented to person, place, and time. She appears well-developed and well-nourished. She does not appear ill. She appears distressed (Crying).  HENT:  Head: Normocephalic and atraumatic.  Eyes: Pupils are equal, round, and reactive to light. Conjunctivae and EOM are normal.  Neck: Normal range of motion and phonation normal. Neck supple.  Cardiovascular: Normal rate and regular rhythm.  Pulmonary/Chest: Effort normal and breath sounds normal. She exhibits no tenderness.  Abdominal:  Soft. She exhibits no distension. There is no tenderness. There is no guarding.  Musculoskeletal: Normal range of motion.  Neurological: She is alert and oriented to person, place, and time. She exhibits normal muscle tone.  Skin: Skin is warm and dry.  Psychiatric: Her behavior is normal. Judgment and thought  content normal.  She appears depressed  Nursing note and vitals reviewed.    ED Treatments / Results  Labs (all labs ordered are listed, but only abnormal results are displayed) Labs Reviewed - No data to display  EKG None  Radiology No results found.  Procedures Procedures (including critical care time)  Medications Ordered in ED Medications - No data to display   Initial Impression / Assessment and Plan / ED Course  I have reviewed the triage vital signs and the nursing notes.  Pertinent labs & imaging results that were available during my care of the patient were reviewed by me and considered in my medical decision making (see chart for details).     Patient Vitals for the past 24 hrs:  BP Temp Temp src Pulse Resp SpO2 Height Weight  11/23/17 1457 - - - - - - 5\' 6"  (1.676 m) 75 kg  11/23/17 1455 (!) 108/57 98 F (36.7 C) Oral 76 18 100 % - -   3:20 PM-TTS consultation   4:11 PM Reevaluation with update and discussion. After initial assessment and treatment, an updated evaluation reveals no change in clinical status.  Had planned to have patient see TTS but she does not want to wait any longer.  Findings discussed and questions answered. Daleen Bo   Medical Decision Making: Personal stress with depression symptoms.  Patient is not suicidal.  No indication for hospitalization or further ED intervention.  CRITICAL CARE-no Performed by: Daleen Bo  Nursing Notes Reviewed/ Care Coordinated Applicable Imaging Reviewed Interpretation of Laboratory Data incorporated into ED treatment  The patient appears reasonably screened and/or stabilized for  discharge and I doubt any other medical condition or other [facility] requiring further screening, evaluation, or treatment in the ED at this time prior to discharge.  Plan: Home Medications-continue usual medications; Home Treatments-rest, fluids, stress management at home; return here if the recommended treatment, does not improve the symptoms; Recommended follow up-PCP as needed.  Psychiatric treatment center choice as outpatient    Final Clinical Impressions(s) / ED Diagnoses   Final diagnoses:  Depression, unspecified depression type  Stress    ED Discharge Orders    None       Daleen Bo, MD 11/23/17 [phone]

## 2017-11-23 NOTE — Discharge Instructions (Addendum)
Follow-up with 1 of the counseling centers such as [facility] to help you with stress management and treatment of depression.  Return here, if needed, for problems.

## 2017-11-23 NOTE — ED Notes (Signed)
TTS completed. Pt had previously wanted to leave, but TTS called and she spoke to them

## 2017-11-23 NOTE — ED Notes (Signed)
Pt very tearful at this time

## 2017-11-23 NOTE — ED Triage Notes (Signed)
Patient c/o generalized abd pain x2 days ago. Denies any nausea, vomiting, diarrhea, fevers, or urinary symptoms. Per patient under extreme stress lately and believes it could be related.

## 2017-11-24 DIAGNOSIS — R634 Abnormal weight loss: Secondary | ICD-10-CM | POA: Diagnosis not present

## 2017-11-24 DIAGNOSIS — G4762 Sleep related leg cramps: Secondary | ICD-10-CM | POA: Diagnosis not present

## 2017-11-24 DIAGNOSIS — M79661 Pain in right lower leg: Secondary | ICD-10-CM | POA: Diagnosis not present

## 2017-11-24 DIAGNOSIS — R638 Other symptoms and signs concerning food and fluid intake: Secondary | ICD-10-CM | POA: Diagnosis not present

## 2017-11-24 DIAGNOSIS — I10 Essential (primary) hypertension: Secondary | ICD-10-CM | POA: Diagnosis not present

## 2017-11-24 DIAGNOSIS — D1722 Benign lipomatous neoplasm of skin and subcutaneous tissue of left arm: Secondary | ICD-10-CM | POA: Diagnosis not present

## 2017-11-24 DIAGNOSIS — M79662 Pain in left lower leg: Secondary | ICD-10-CM | POA: Diagnosis not present

## 2017-11-24 DIAGNOSIS — K219 Gastro-esophageal reflux disease without esophagitis: Secondary | ICD-10-CM | POA: Diagnosis not present

## 2017-11-24 DIAGNOSIS — F439 Reaction to severe stress, unspecified: Secondary | ICD-10-CM | POA: Diagnosis not present

## 2017-11-24 DIAGNOSIS — M79605 Pain in left leg: Secondary | ICD-10-CM | POA: Diagnosis not present

## 2017-11-24 DIAGNOSIS — M79604 Pain in right leg: Secondary | ICD-10-CM | POA: Diagnosis not present

## 2017-11-25 ENCOUNTER — Other Ambulatory Visit: Payer: Self-pay | Admitting: Orthopaedic Surgery

## 2017-11-25 ENCOUNTER — Telehealth: Payer: Self-pay | Admitting: Orthopaedic Surgery

## 2017-11-25 NOTE — Telephone Encounter (Signed)
Duplicate

## 2017-11-25 NOTE — Telephone Encounter (Signed)
Hydrocodone-Acetaminophen  7.5/325 mg  Qty 50 Tablets  PATIENT USES [facility]

## 2017-12-10 ENCOUNTER — Encounter (HOSPITAL_COMMUNITY): Payer: Self-pay | Admitting: Emergency Medicine

## 2017-12-10 ENCOUNTER — Emergency Department (HOSPITAL_COMMUNITY)
Admission: EM | Admit: 2017-12-10 | Discharge: 2017-12-10 | Disposition: A | Discharge status: Home / Self Care | Payer: BLUE CROSS/BLUE SHIELD | Attending: Emergency Medicine | Admitting: Emergency Medicine

## 2017-12-10 ENCOUNTER — Emergency Department (HOSPITAL_COMMUNITY): Payer: BLUE CROSS/BLUE SHIELD

## 2017-12-10 ENCOUNTER — Telehealth: Payer: Self-pay | Admitting: Radiology

## 2017-12-10 ENCOUNTER — Other Ambulatory Visit: Payer: Self-pay

## 2017-12-10 DIAGNOSIS — F17210 Nicotine dependence, cigarettes, uncomplicated: Secondary | ICD-10-CM | POA: Insufficient documentation

## 2017-12-10 DIAGNOSIS — R1013 Epigastric pain: Secondary | ICD-10-CM | POA: Diagnosis not present

## 2017-12-10 DIAGNOSIS — I10 Essential (primary) hypertension: Secondary | ICD-10-CM | POA: Insufficient documentation

## 2017-12-10 LAB — COMPREHENSIVE METABOLIC PANEL
ALK PHOS: 48 U/L (ref 38–126)
ALT: 15 U/L (ref 0–44)
AST: 23 U/L (ref 15–41)
Albumin: 3.9 g/dL (ref 3.5–5.0)
Anion gap: 8 (ref 5–15)
BUN: 12 mg/dL (ref 6–20)
CALCIUM: 9.5 mg/dL (ref 8.9–10.3)
CO2: 26 mmol/L (ref 22–32)
CREATININE: 0.96 mg/dL (ref 0.44–1.00)
Chloride: 103 mmol/L (ref 98–111)
GFR calc Af Amer: >60 mL/min (ref >60)
Glucose, Bld: 109 mg/dL — ABNORMAL HIGH (ref 70–99)
Potassium: 3.4 mmol/L — ABNORMAL LOW (ref 3.5–5.1)
Sodium: 137 mmol/L (ref 135–145)
Total Bilirubin: 0.3 mg/dL (ref 0.3–1.2)
Total Protein: 7.8 g/dL (ref 6.5–8.1)

## 2017-12-10 LAB — LIPASE, BLOOD: LIPASE: 34 U/L (ref 11–51)

## 2017-12-10 LAB — CBC WITH DIFFERENTIAL/PLATELET
Basophils Absolute: 0 K/uL (ref 0.0–0.1)
Basophils Relative: 0 %
Eosinophils Absolute: 0.1 K/uL (ref 0.0–0.7)
Eosinophils Relative: 1 %
HCT: 35.1 % — ABNORMAL LOW (ref 36.0–46.0)
Hemoglobin: 11.7 g/dL — ABNORMAL LOW (ref 12.0–15.0)
Lymphocytes Relative: 36 %
Lymphs Abs: 2.1 K/uL (ref 0.7–4.0)
MCH: 28.6 pg (ref 26.0–34.0)
MCHC: 33.3 g/dL (ref 30.0–36.0)
MCV: 85.8 fL (ref 78.0–100.0)
Monocytes Absolute: 0.4 K/uL (ref 0.1–1.0)
Monocytes Relative: 7 %
Neutro Abs: 3.2 K/uL (ref 1.7–7.7)
Neutrophils Relative %: 56 %
Platelets: 375 K/uL (ref 150–400)
RBC: 4.09 MIL/uL (ref 3.87–5.11)
RDW: 13.4 % (ref 11.5–15.5)
WBC: 5.8 K/uL (ref 4.0–10.5)

## 2017-12-10 LAB — URINALYSIS, ROUTINE W REFLEX MICROSCOPIC
Bacteria, UA: NONE SEEN
Bilirubin Urine: NEGATIVE
Glucose, UA: NEGATIVE mg/dL
Ketones, ur: NEGATIVE mg/dL
Leukocytes, UA: NEGATIVE
Nitrite: NEGATIVE
Protein, ur: NEGATIVE mg/dL
Specific Gravity, Urine: 1.013 (ref 1.005–1.030)
pH: 5 (ref 5.0–8.0)

## 2017-12-10 IMAGING — CT CT ABD-PELV W/ CM
2 of 5 series · 15 of 46 positions shown, 17 images · IV contrast (Isovue)
Comparison: [DATE] CT.

CLINICAL DATA: 56-year-old female with 11 pound weight loss in 3
months. Epigastric pain for 3 days. Prior appendectomy. Smoker.
Hypertension. Initial encounter.

EXAM:
CT ABDOMEN AND PELVIS WITH CONTRAST
TECHNIQUE: Multidetector CT imaging of the abdomen and pelvis was performed
using the standard protocol following bolus administration of
intravenous contrast.
CONTRAST:  100mL [N4] IOPAMIDOL ([N4]) INJECTION 61%

[Series 2: axial st · axial · 0.65mm/px · z∈[+913,+1333]mm · 12 of 96 slices shown, 14 images]
[im 6/96  soft-tissue]
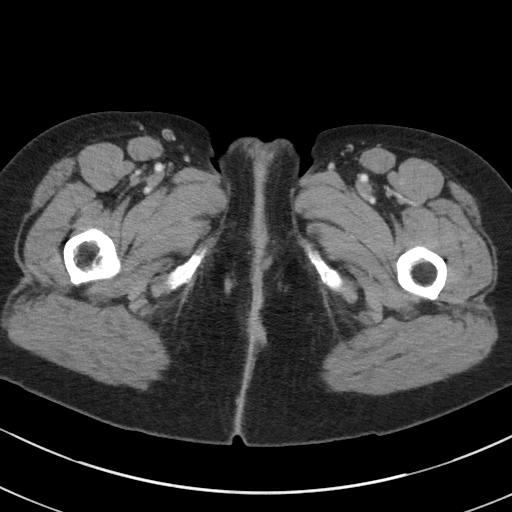
[im 6/96  bone]
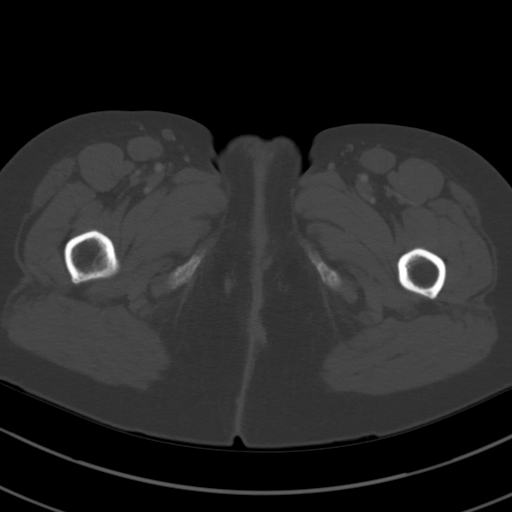
[im 16/96  soft-tissue]
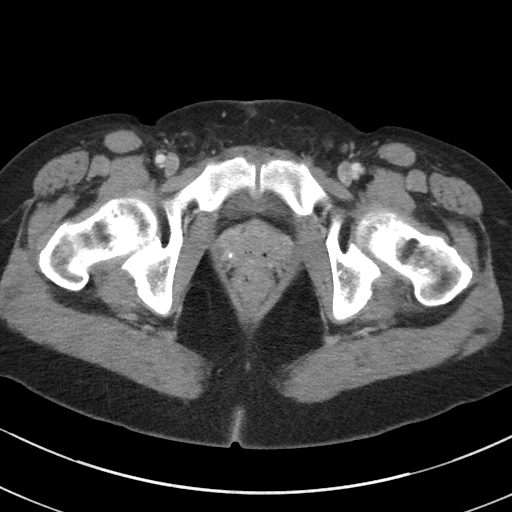
[im 22/96  soft-tissue]
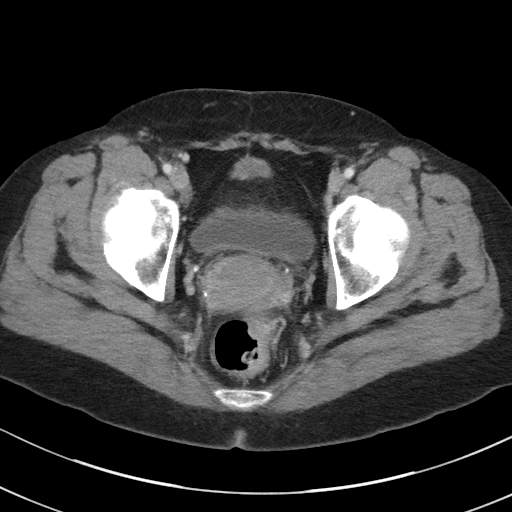
[im 27/96  soft-tissue]
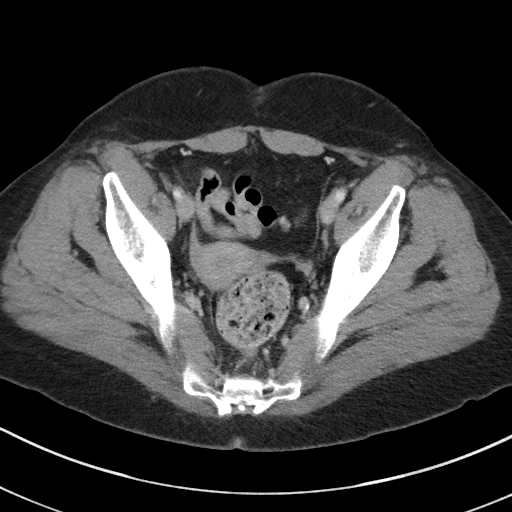
[im 37/96  soft-tissue]
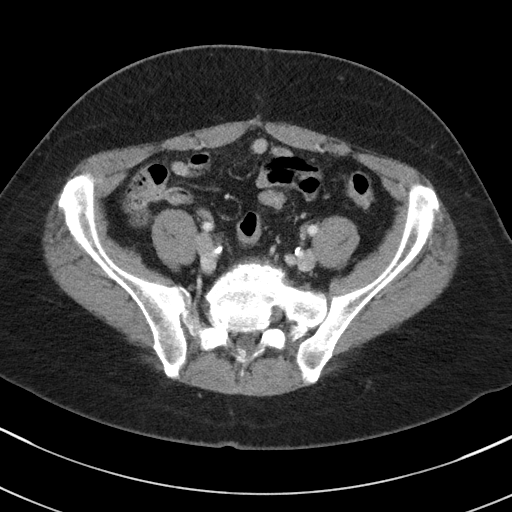
[im 43/96  soft-tissue]
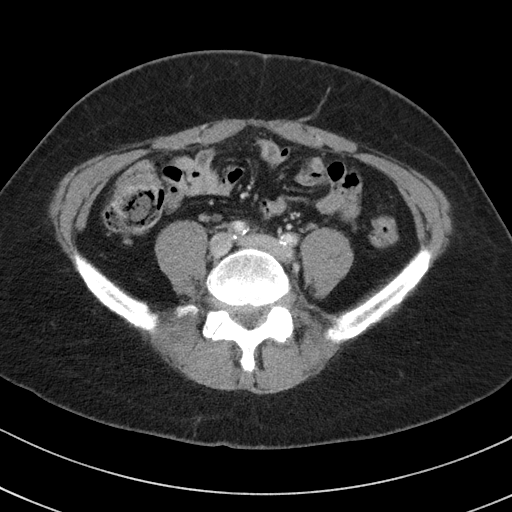
[im 53/96  soft-tissue]
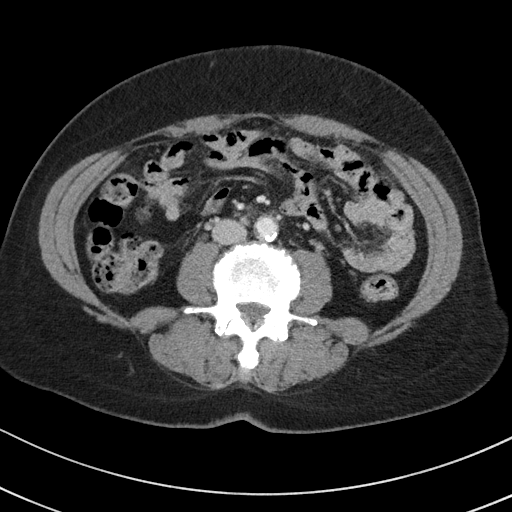
[im 59/96  soft-tissue]
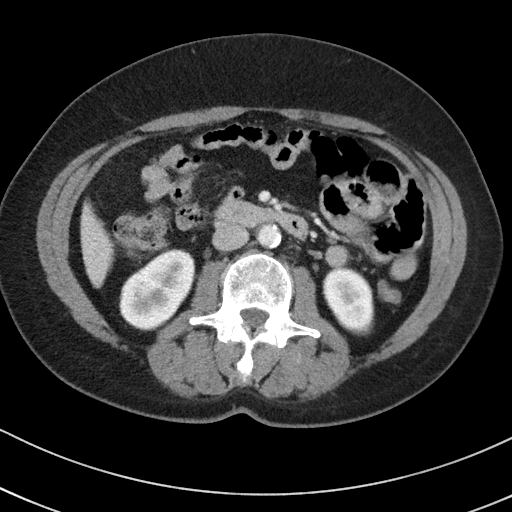
[im 69/96  soft-tissue]
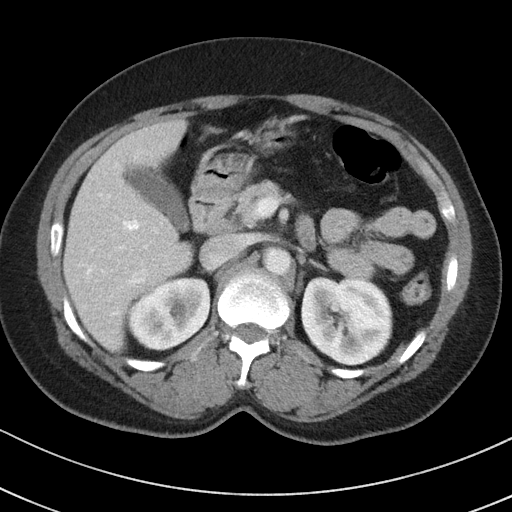
[im 69/96  bone]
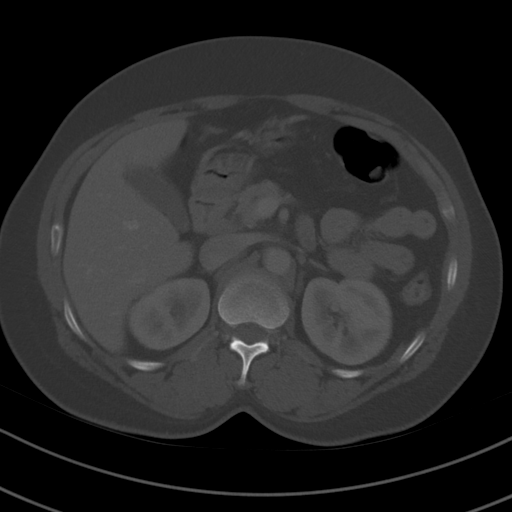
[im 74/96  soft-tissue]
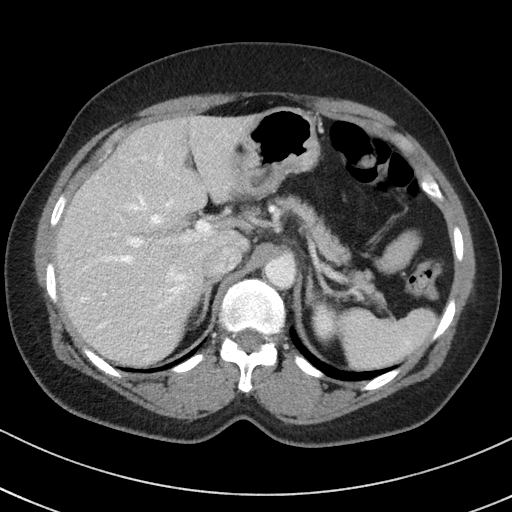
[im 80/96  soft-tissue]
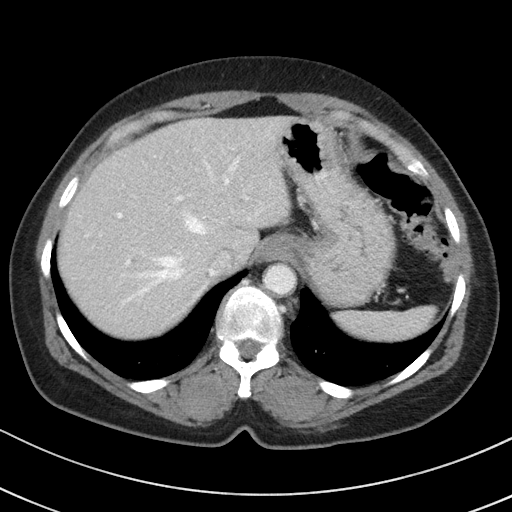
[im 90/96  soft-tissue]
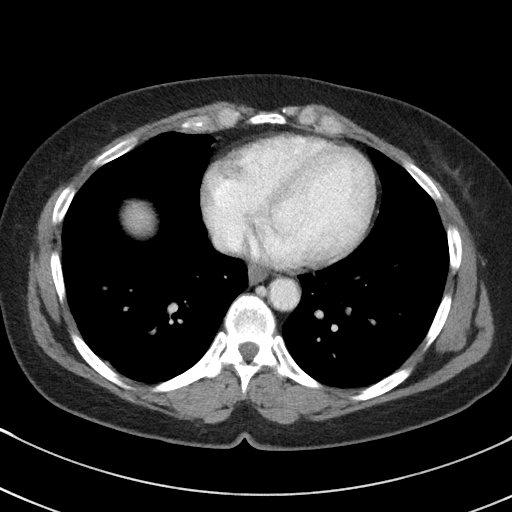

[Series 6: coronal st · coronal · 0.84mm/px · 3 of 75 slices shown]
[im 25/75  soft-tissue]
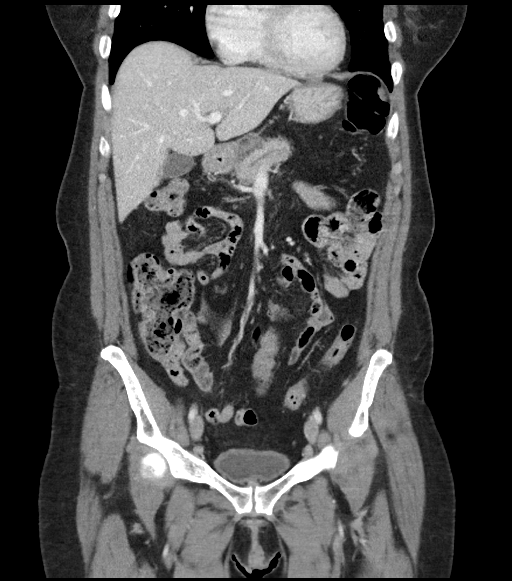
[im 33/75  soft-tissue]
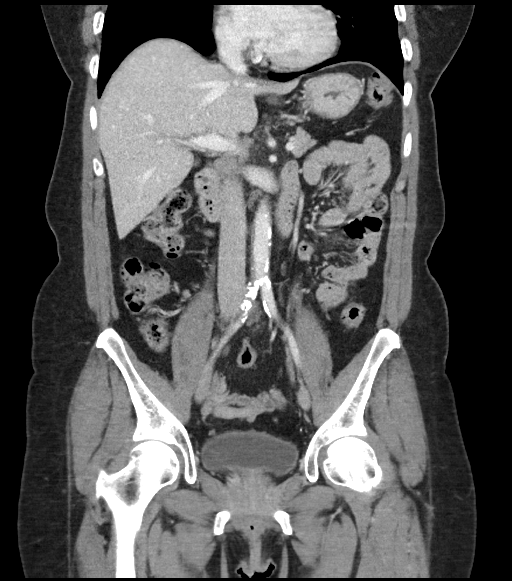
[im 42/75  soft-tissue]
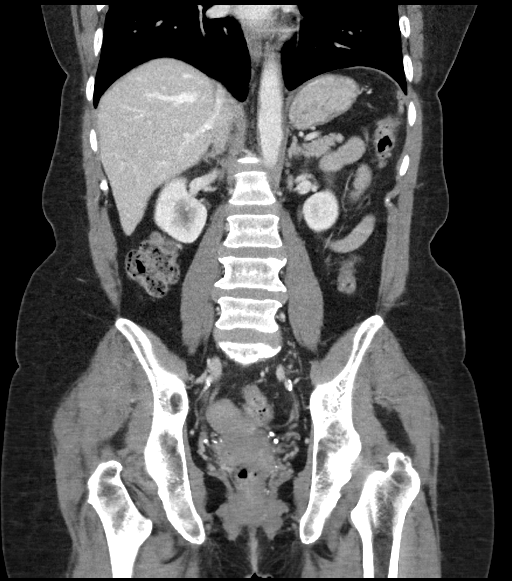

[15 of 46 positions shown; findings below may reference images not displayed]

FINDINGS: Lower chest: Tiny nodules right middle lobe stable as is minimal
pleural thickening. No worrisome lung base abnormality. Heart size
within normal limits.

Hepatobiliary: 3 mm low-density structure inferior aspect right lobe
liver possibly a cyst but too small to characterize. No other
hepatic lesion noted. No calcified gallstones. Common bile duct (6.7
m) and distal pancreatic duct (4.3 mm) are slightly prominent. No
obstructing stone or obvious mass identified. MRCP can be obtained
for further delineation.

Pancreas: Mild prominence pancreatic duct. No obvious pancreatic
mass.

Spleen: No splenic mass or enlargement.

Adrenals/Urinary Tract: Posterior wedge-shaped hypodensity right
kidney on delayed imaging (series 3 images 21 through 23).
Significance/etiology indeterminate. Question result of artifact,
pyelonephritis, infarct or mass. This can be assessed with contrast
enhanced renal MR at the time a MRCP. No obstructing stone or
hydronephrosis. No adrenal lesion. Partially decompressed urinary
bladder with mild circumferential wall thickening. No polypoid
lesion noted.

Stomach/Bowel: No extraluminal bowel inflammatory process. Portions
of bowel and stomach under distended limiting evaluation for
detection of underlying mass.

Vascular/Lymphatic: Atherosclerotic changes aorta, aortic branch
vessels, iliac arteries and femoral arteries.. No abdominal aortic
aneurysm or large vessel occlusion.

Scattered normal size lymph nodes.

Reproductive: Uterus tilted to the right. No worrisome adnexal mass.

Other: No free air or bowel containing hernia.

Musculoskeletal: Degenerative changes lower thoracic and lumbar
spine. Regions of sclerosis felt to be related to degenerative
changes. Changes suggestive of bilateral femoral head avascular
necrosis without collapse.
IMPRESSION: 1. Common bile duct (6.7 m) and distal pancreatic duct (4.3 mm) are
slightly prominent. No obstructing stone or obvious mass identified.
MRCP can be obtained for further delineation.
2. Posterior wedge-shaped hypodensity right kidney on delayed
indeterminate. Question result of artifact, pyelonephritis, infarct
or mass. This can be assessed with contrast enhanced renal MR at the
time a MRCP.
3. Changes suggestive of bilateral femoral head avascular necrosis
without collapse.
4. No extraluminal bowel inflammatory process. Secondary to under
distension of portions of bowel and stomach, limited for detecting
underlying mass. No obvious mass detected.
5.  Aortic Atherosclerosis ([N4]-[N4]).

## 2017-12-10 MED ORDER — SODIUM CHLORIDE 0.9 % IV BOLUS
500.0000 mL | Freq: Once | INTRAVENOUS | Status: AC
  Administered 2017-12-10: 500 mL via INTRAVENOUS

## 2017-12-10 MED ORDER — IOPAMIDOL (ISOVUE-300) INJECTION 61%
100.0000 mL | Freq: Once | INTRAVENOUS | Status: AC | PRN
  Administered 2017-12-10: 100 mL via INTRAVENOUS

## 2017-12-10 MED ORDER — SUCRALFATE 1 G PO TABS: 1.0000 g | ORAL_TABLET | Freq: Three times a day (TID) | ORAL | 0 refills | Status: DC

## 2017-12-10 MED ORDER — PANTOPRAZOLE SODIUM 20 MG PO TBEC: 20.0000 mg | DELAYED_RELEASE_TABLET | Freq: Two times a day (BID) | ORAL | 0 refills | Status: DC

## 2017-12-10 NOTE — Telephone Encounter (Signed)
Noted  

## 2017-12-10 NOTE — ED Notes (Signed)
Patient transported to CT 

## 2017-12-10 NOTE — Telephone Encounter (Signed)
-----  Message from Uvaldo Bristle sent at 12/10/2017  3:26 PM EDT ----- Regarding: rheumatology referral question Amber Drake,  Patient (Dr Karmen Stabs, [facility] [[id number]] called & said she did not go to referral appt, but wants to re-schedule now(said she's met her deductible & wants to r/s). Please look at referral - okay for pt to re-schedule?  She already has a fol/up appt 12/18/17. Thanks

## 2017-12-10 NOTE — Telephone Encounter (Signed)
Have sent referral to [facility] per patient request. Dr Amil Amen will not RS after two no shows.

## 2017-12-10 NOTE — ED Provider Notes (Signed)
Emergency Department Provider Note   I have reviewed the triage vital signs and the nursing notes.   HISTORY  Chief Complaint Abdominal Pain   HPI Kewanna Kasprzak is a 57 y.o. female with PMH of HLD, HTN, and anemia presents to the emergency department for evaluation of epigastric abdominal pain which began 2 days ago.  She denies any associated nausea, vomiting, diarrhea.  No radiation of pain or modifying factors.  The pain is constant and moderate in intensity.  She is having normal bowel movements.  No UTI symptoms.  He does note increased stress over the past several weeks.  She has had unintentional weight loss over the past 2 months of 12 pounds.  No diet changes.   Past Medical History:  Diagnosis Date  . Anemia   . Arthritis   . Blood dyscrasia    sickle cell trait  . Carbuncle and furuncle   . Hypercholesterolemia   . Hypertension    does not take meds  . Sickle cell trait [facility])     Patient Active Problem List   Diagnosis Date Noted  . Hidradenitis 07/31/2016  . Recurrent boils 07/31/2016  . Hyperlipidemia 06/15/2015  . Pain of left hand 05/19/2015  . Hand pain, right 05/19/2015  . Hematuria 03/03/2015  . Esophageal reflux 03/03/2015  . Essential hypertension, benign 03/03/2015  . Bronchitis due to tobacco use ([place]) 10/17/2010    Past Surgical History:  Procedure Laterality Date  . APPENDECTOMY    . CARPAL TUNNEL RELEASE  03/23/2011   Procedure: CARPAL TUNNEL RELEASE;  Surgeon: Sanjuana Kava;  Location: [facility];  Service: Orthopedics;  Laterality: Left;  . CARPAL TUNNEL RELEASE  05/10/2011   Procedure: CARPAL TUNNEL RELEASE;  Surgeon: Sanjuana Kava, MD;  Location: [facility];  Service: Orthopedics;  Laterality: Right;  . CESAREAN SECTION     x 2  . INCISION AND DRAINAGE PERIRECTAL ABSCESS  12/21/09  . PROCTOSCOPY  10/17/2010   Procedure: PROCTOSCOPY;  Surgeon: Scherry Ran;  Location: [facility];  Service: General;  Laterality: N/A;  Rigid  Proctoscopy/Possible Fistula in Ano  Procedure ended at 1003  . THERAPEUTIC ABORTION     x2    Allergies Penicillins; Penicillins cross reactors; and Tape  Family History  Problem Relation Age of Onset  . Kidney failure Daughter   . Sickle cell anemia Daughter   . Sickle cell trait Daughter   . Sickle cell anemia Son   . Hypertension Mother   . Hyperlipidemia Mother   . Hypertension Father   . Lupus Father        skin  . Hypertension Other   . Sarcoidosis Sister   . Anesthesia problems Neg Hx   . Hypotension Neg Hx   . Malignant hyperthermia Neg Hx   . Pseudochol deficiency Neg Hx     Social History Social History   Tobacco Use  . Smoking status: Current Every Day Smoker    Packs/day: 0.50    Years: 30.00    Pack years: 15.00    Types: Cigarettes  . Smokeless tobacco: Never Used  Substance Use Topics  . Alcohol use: No  . 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 ENT: No sore throat. Cardiovascular: Denies chest pain. Respiratory: Denies shortness of breath. Gastrointestinal: Positive epigastric abdominal pain.  No nausea, no vomiting.  No diarrhea.  No constipation. Positive unintentional weight loss.  Genitourinary: Negative for dysuria. Musculoskeletal: Negative for back pain. Skin: Negative for rash. Neurological: Negative for headaches, focal weakness or numbness.  10-point ROS otherwise negative.  ____________________________________________   PHYSICAL EXAM:  VITAL SIGNS: Vitals:   12/10/17 0800  BP: 100/72  Pulse: 69  Resp: 16  SpO2: 99%    Constitutional: Alert and oriented. Well appearing and in no acute distress. Eyes: Conjunctivae are normal.  Head: Atraumatic. Nose: No congestion/rhinnorhea. Mouth/Throat: Mucous membranes are moist.  Oropharynx non-erythematous. Neck: No stridor.   Cardiovascular: Normal rate, regular rhythm. Good peripheral  circulation. Grossly normal heart sounds.   Respiratory: Normal respiratory effort.  No retractions. Lungs CTAB. Gastrointestinal: Soft with moderate tenderness in the epigastric region. No distention.  Musculoskeletal: No lower extremity tenderness nor edema. No gross deformities of extremities. Neurologic:  Normal speech and language. No gross focal neurologic deficits are appreciated.  Skin:  Skin is warm, dry and intact. No rash noted.  ____________________________________________   LABS (all labs ordered are listed, but only abnormal results are displayed)  Labs Reviewed  COMPREHENSIVE METABOLIC PANEL - Abnormal; Notable for the following components:      Result Value   Potassium 3.4 (*)    Glucose, Bld 109 (*)    All other components within normal limits  CBC WITH DIFFERENTIAL/PLATELET - Abnormal; Notable for the following components:   Hemoglobin 11.7 (*)    HCT 35.1 (*)    All other components within normal limits  URINALYSIS, ROUTINE W REFLEX MICROSCOPIC - Abnormal; Notable for the following components:   Hgb urine dipstick LARGE (*)    All other components within normal limits  LIPASE, BLOOD   ____________________________________________  RADIOLOGY  Ct Abdomen Pelvis W Contrast  Result Date: 12/10/2017 CLINICAL DATA:  57 year old female with 11 pound weight loss in 3 months. Epigastric pain for 3 days. Prior appendectomy. Smoker. Hypertension. Initial encounter. EXAM: CT ABDOMEN AND PELVIS WITH CONTRAST TECHNIQUE: Multidetector CT imaging of the abdomen and pelvis was performed using the standard protocol following bolus administration of intravenous contrast. CONTRAST:  132mL ISOVUE-300 IOPAMIDOL (ISOVUE-300) INJECTION 61% COMPARISON:  03/01/2015 CT. FINDINGS: Lower chest: Tiny nodules right middle lobe stable as is minimal pleural thickening. No worrisome lung base abnormality. Heart size within normal limits. Hepatobiliary: 3 mm low-density structure inferior aspect  right lobe liver possibly a cyst but too small to characterize. No other hepatic lesion noted. No calcified gallstones. Common bile duct (6.7 m) and distal pancreatic duct (4.3 mm) are slightly prominent. No obstructing stone or obvious mass identified. MRCP can be obtained for further delineation. Pancreas: Mild prominence pancreatic duct. No obvious pancreatic mass. Spleen: No splenic mass or enlargement. Adrenals/Urinary Tract: Posterior wedge-shaped hypodensity right kidney on delayed imaging (series 3 images 21 through 23). Significance/etiology indeterminate. Question result of artifact, pyelonephritis, infarct or mass. This can be assessed with contrast enhanced renal MR at the time a MRCP. No obstructing stone or hydronephrosis. No adrenal lesion. Partially decompressed urinary bladder with mild circumferential wall thickening. No polypoid lesion noted. Stomach/Bowel: No extraluminal bowel inflammatory process. Portions of bowel and stomach under distended limiting evaluation for detection of underlying mass. Vascular/Lymphatic: Atherosclerotic changes aorta, aortic branch vessels, iliac arteries and femoral arteries. No abdominal aortic aneurysm or large vessel occlusion. Scattered normal size lymph nodes. Reproductive: Uterus tilted to the right. No worrisome adnexal mass. Other: No free air or bowel containing hernia. Musculoskeletal: Degenerative changes lower thoracic and lumbar spine. Regions of sclerosis felt to be related to degenerative changes. 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 IMPRESSION: 1. Common bile duct (6.7 m) and distal pancreatic duct (4.3 mm) are slightly prominent. No obstructing stone or obvious mass identified. MRCP can be obtained for further delineation. 2. Posterior wedge-shaped hypodensity right kidney on delayed imaging (series 3 images 21 through 23). Significance/etiology indeterminate. Question result of artifact, pyelonephritis,  infarct or mass. This can be assessed with contrast enhanced renal MR at the time a MRCP. 3. 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 4. No extraluminal bowel inflammatory process. Secondary to under distension of portions of bowel and stomach, limited for detecting underlying mass. No obvious mass detected. 5.  Aortic Atherosclerosis (ICD10-I70.0). Electronically Signed   By: Genia Del M.D.   On: 12/10/2017 10:14    ____________________________________________   PROCEDURES  Procedure(s) performed:   Procedures  None ____________________________________________   INITIAL IMPRESSION / ASSESSMENT AND PLAN / ED COURSE  Pertinent labs & imaging results that were available during my care of the patient were reviewed by me and considered in my medical decision making (see chart for details).  Presents to the emergency department with epigastric abdominal pain for the past 2 days.  No other associated symptoms or modifying factors.  Finally concerning the patient is also had unintentional weight loss over the past 2 months.  The seated symptoms of the patient's epigastric abdominal pain does not point to obvious infectious etiology.  No concern for gallbladder etiology.  Plan for CT abdomen pelvis.   10:20 AM The imaging reviewed with the patient.  She does have kidney and biliary findings but no acute pathology.  MRCP is recommended but given the improvement in patient's symptoms and normal labs I feel the patient is safe for discharge.  I provided contact information for gastroenterology and she will call today for follow-up appointment.  No clinical concern for pyelonephritis with no symptoms or sign of infection on UA. Starting Protonix and Carafate.   At this time, I do not feel there is any life-threatening condition present. I have reviewed and discussed all results (EKG, imaging, lab, urine as appropriate), exam findings with patient. I have  reviewed nursing notes and appropriate previous records.  I feel the patient is safe to be discharged home without further emergent workup. Discussed usual and customary return precautions. Patient and family (if present) verbalize understanding and are comfortable with this plan.  Patient will follow-up with their primary care provider. If they do not have a primary care provider, information for follow-up has been provided to them. All questions have been answered.  ____________________________________________  FINAL CLINICAL IMPRESSION(S) / ED DIAGNOSES  Final diagnoses:  Epigastric pain     MEDICATIONS GIVEN DURING THIS VISIT:  Medications  sodium chloride 0.9 % bolus 500 mL (0 mLs Intravenous Stopped 12/10/17 0856)  iopamidol (ISOVUE-300) 61 % injection 100 mL (100 mLs Intravenous Contrast Given 12/10/17 0901)     NEW OUTPATIENT MEDICATIONS STARTED DURING THIS VISIT:  New Prescriptions   PANTOPRAZOLE (PROTONIX) 20 MG TABLET    Take 1 tablet (20 mg total) by mouth 2 (two) times daily.   SUCRALFATE (CARAFATE) 1 G TABLET    Take 1 tablet (1 g total) by mouth 4 (four) times daily -  with meals and at bedtime for 7 days.    Note:  This document was prepared using Dragon voice recognition software and may include unintentional dictation errors.  Nanda Quinton, MD Emergency Medicine    Ashelyn Mccravy, [facility], MD 12/10/17  1027  

## 2017-12-10 NOTE — Telephone Encounter (Signed)
Patient had appointment on 12/06/17 at 9am patient was aware of the appointment with Dr Amil Amen. She needs to call there to [facility], not sure they will since she has no showed twice now. I called her to give her the number (again) for Dr Amil Amen. I have also given her the number to [facility], since I am fairly certain Dr Amil Amen will not RS due to 2 no show appointments.  To you FYI

## 2017-12-10 NOTE — ED Notes (Signed)
Pt returned from CT °

## 2017-12-10 NOTE — ED Triage Notes (Addendum)
PT c/o generalized abdominal pain x2 days with no n/v/d. PT states normal BM yesterday. PT denies any urinary symptoms. PT states she has had increased stress over the past few weeks with her family and feels like this could be anxiety.

## 2017-12-10 NOTE — Discharge Instructions (Signed)
You have been seen in the Emergency Department (ED) for abdominal pain.  Your evaluation did not identify a clear cause of your symptoms but was generally reassuring.  You will need to call the gastroenterologist listed regarding your CT scan. Your Biliary ducts are slightly enlarged and may need an MRI done through the gastroenterologist for further evaluation. Call today to schedule an appointment.   Please follow up as instructed above regarding todays emergent visit and the symptoms that are bothering you.  Return to the ED if your abdominal pain worsens or fails to improve, you develop bloody vomiting, bloody diarrhea, you are unable to tolerate fluids due to vomiting, fever greater than 101, or other symptoms that concern you.

## 2017-12-14 ENCOUNTER — Other Ambulatory Visit: Payer: Self-pay | Admitting: Orthopaedic Surgery

## 2017-12-17 ENCOUNTER — Ambulatory Visit (INDEPENDENT_AMBULATORY_CARE_PROVIDER_SITE_OTHER): Payer: BLUE CROSS/BLUE SHIELD | Admitting: Orthopaedic Surgery

## 2017-12-17 ENCOUNTER — Ambulatory Visit (INDEPENDENT_AMBULATORY_CARE_PROVIDER_SITE_OTHER): Payer: BLUE CROSS/BLUE SHIELD | Admitting: Internal Medicine

## 2017-12-17 ENCOUNTER — Encounter (INDEPENDENT_AMBULATORY_CARE_PROVIDER_SITE_OTHER): Payer: Self-pay | Admitting: Internal Medicine

## 2017-12-17 ENCOUNTER — Encounter: Payer: Self-pay | Admitting: Orthopaedic Surgery

## 2017-12-17 VITALS — BP 107/68 | HR 70 | Ht 64.5 in | Wt 164.0 lb

## 2017-12-17 VITALS — BP 110/60 | HR 84 | Ht 64.5 in | Wt 166.0 lb

## 2017-12-17 DIAGNOSIS — R1013 Epigastric pain: Secondary | ICD-10-CM | POA: Diagnosis not present

## 2017-12-17 DIAGNOSIS — M79642 Pain in left hand: Secondary | ICD-10-CM

## 2017-12-17 DIAGNOSIS — F17210 Nicotine dependence, cigarettes, uncomplicated: Secondary | ICD-10-CM

## 2017-12-17 DIAGNOSIS — R768 Other specified abnormal immunological findings in serum: Secondary | ICD-10-CM

## 2017-12-17 DIAGNOSIS — M79641 Pain in right hand: Secondary | ICD-10-CM | POA: Diagnosis not present

## 2017-12-17 NOTE — Progress Notes (Signed)
   Subjective:    Patient ID: Amber Drake, female    DOB: 1961/02/15, 57 y.o.   MRN: [id number]  HPI Here today for f/u. After recent visit to the ED 12/10/2017 for epigastric pain.  Underwent a CT which revealed: IMPRESSION: 1. Common bile duct (6.7 m) and distal pancreatic duct (4.3 mm) are slightly prominent. No obstructing stone or obvious mass identified. MRCP can be obtained for further delineation. 2. Posterior wedge-shaped hypodensity right kidney on delayed imaging (series 3 images 21 through 23). Significance/etiology indeterminate. Question result of artifact, pyelonephritis, infarct or mass. This can be assessed with contrast enhanced renal MR at the time a MRCP. 3. 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 4. No extraluminal bowel inflammatory process. Secondary to under distension of portions of bowel and stomach, limited for detecting underlying mass. No obvious mass detected. 5.  Aortic Atherosclerosis (ICD10-I70.0).  Her liver enzymes were normal.  She had had pain x 2 days in her epigastric region.  No N,V, D.  ? Weight loss of 9 pounds which she says is due to her daughter's critical illness recent separation from her husband. .  She is pain free at this symptoms.  She tells me she feels good.  She has no GI complaints. She is taking the Protonix. She says she rarely has acid reflux and when she does she takes Zantac. BMs are normal.  Appetite is okay.   CBC    Component Value Date/Time   WBC 5.8 12/10/2017 0823   RBC 4.09 12/10/2017 0823   HGB 11.7 (L) 12/10/2017 0823   HCT 35.1 (L) 12/10/2017 0823   PLT 375 12/10/2017 0823   MCV 85.8 12/10/2017 0823   MCH 28.6 12/10/2017 0823   MCHC 33.3 12/10/2017 0823   RDW 13.4 12/10/2017 0823   LYMPHSABS 2.1 12/10/2017 0823   MONOABS 0.4 12/10/2017 0823   EOSABS 0.1 12/10/2017 0823   BASOSABS 0.0 12/10/2017 0823  ; Hepatic Function Latest Ref Rng & Units 12/10/2017 04/18/2017  03/19/2016  Total Protein 6.5 - 8.1 g/dL 7.8 8.4(H) 8.3(H)  Albumin 3.5 - 5.0 g/dL 3.9 4.1 4.0  AST 15 - 41 U/L '23 17 17  '$ ALT 0 - 44 U/L '15 15 15  '$ Alk Phosphatase 38 - 126 U/L 48 57 59  Total Bilirubin 0.3 - 1.2 mg/dL 0.3 0.5 0.3     Review of Systems     Objective:   Physical Exam. Blood pressure 110/60, pulse 84, height 5' 4.5" (1.638 m), weight 166 lb (75.3 kg), last menstrual period 08/12/2010. Alert and oriented. Skin warm and dry. Oral mucosa is moist.   . Sclera anicteric, conjunctivae is pink. Thyroid not enlarged. No cervical lymphadenopathy. Lungs clear. Heart regular rate and rhythm.  Abdomen is soft. Bowel sounds are positive. No hepatomegaly. No abdominal masses felt. No tenderness.  No edema to lower extremities.           Assessment & Plan:  GERD. She will continue the Protonix. No further work up needed at this time.  If she has any problems she will call our office.  Will consider GB work up.

## 2017-12-17 NOTE — Patient Instructions (Signed)
OV as needed 

## 2017-12-17 NOTE — Progress Notes (Signed)
Patient Amber Drake, female DOB:01-Jun-1960, 57 y.o. FGH:[id number]  Chief Complaint  Patient presents with  . Hand Pain    bilateral     HPI  Amber Drake is a 57 y.o. female who has continued pain in both hands more dorsally and at MCP joints.  She has positive ANA.  Nothing seems to help.  She had appointment to see rheumatology at Dr. Georgiana Shore office but missed two appointments and they will not reschedule.  She has an appointment to see rheumatology at [facility] on 03/24/18.  She says she will keep this one.  She still has pain most of the time.  She is taking pain medicine.   Body mass index is 27.72 kg/m.  ROS  Review of Systems  HENT: Negative for congestion.   Respiratory: Negative for cough and shortness of breath.   Cardiovascular: Negative for chest pain and leg swelling.  Endocrine: Positive for cold intolerance.  Musculoskeletal: Positive for arthralgias.  Allergic/Immunologic: Positive for environmental allergies.  All other systems reviewed and are negative.   All other systems reviewed and are negative.  The following is a summary of the past history medically, past history surgically, known current medicines, social history and family history.  This information is gathered electronically by the computer from prior information and documentation.  I review this each visit and have found including this information at this point in the chart is beneficial and informative.    Past Medical History:  Diagnosis Date  . Anemia   . Arthritis   . Blood dyscrasia    sickle cell trait  . Carbuncle and furuncle   . Hypercholesterolemia   . Hypertension    does not take meds  . Sickle cell trait [facility])     Past Surgical History:  Procedure Laterality Date  . APPENDECTOMY    . CARPAL TUNNEL RELEASE  03/23/2011   Procedure: CARPAL TUNNEL RELEASE;  Surgeon: Sanjuana Kava;  Location: [facility];  Service: Orthopedics;  Laterality: Left;  . CARPAL TUNNEL RELEASE   05/10/2011   Procedure: CARPAL TUNNEL RELEASE;  Surgeon: Sanjuana Kava, MD;  Location: [facility];  Service: Orthopedics;  Laterality: Right;  . CESAREAN SECTION     x 2  . INCISION AND DRAINAGE PERIRECTAL ABSCESS  12/21/09  . PROCTOSCOPY  10/17/2010   Procedure: PROCTOSCOPY;  Surgeon: Scherry Ran;  Location: [facility];  Service: General;  Laterality: N/A;  Rigid Proctoscopy/Possible Fistula in Ano  Procedure ended at 1003  . THERAPEUTIC ABORTION     x2    Family History  Problem Relation Age of Onset  . Kidney failure Daughter   . Sickle cell anemia Daughter   . Sickle cell trait Daughter   . Sickle cell anemia Son   . Hypertension Mother   . Hyperlipidemia Mother   . Hypertension Father   . Lupus Father        skin  . Hypertension Other   . Sarcoidosis Sister   . Anesthesia problems Neg Hx   . Hypotension Neg Hx   . Malignant hyperthermia Neg Hx   . Pseudochol deficiency Neg Hx     Social History Social History   Tobacco Use  . Smoking status: Current Every Day Smoker    Packs/day: 0.50    Years: 30.00    Pack years: 15.00    Types: Cigarettes  . Smokeless tobacco: Never Used  Substance Use Topics  . Alcohol use: No  . Drug use: No    Comment: clean  for 1 1/2 years      Current Outpatient Medications  Medication Sig Dispense Refill  . atorvastatin (LIPITOR) 20 MG tablet Take 1 tablet (20 mg total) by mouth daily. 90 tablet 2  . cyclobenzaprine (FLEXERIL) 10 MG tablet Take 1 tablet (10 mg total) by mouth 3 (three) times daily as needed for muscle spasms. (Patient taking differently: Take 10 mg by mouth 2 (two) times daily as needed for muscle spasms. ) 40 tablet 0  . ferrous sulfate 325 (65 FE) MG tablet Take 325 mg by mouth daily with breakfast.    . HYDROcodone-acetaminophen (NORCO) 7.5-325 MG tablet Take 1 tablet by mouth every 6 (six) hours as needed for moderate pain (Must last 30 days). 50 tablet 0  . HYDROcodone-acetaminophen (NORCO) 7.5-325 MG tablet TAKE  (1) TABLET BY MOUTH EVERY 6 HOURS AS NEEDED; MUST LAST 30 DAYS. 50 tablet 0  . HYDROcodone-acetaminophen (NORCO) 7.5-325 MG tablet TAKE (1) TABLET BY MOUTH EVERY 6 HOURS AS NEEDED; MUST LAST 30 DAYS. 50 tablet 0  . lisinopril-hydrochlorothiazide (PRINZIDE,ZESTORETIC) 20-12.5 MG tablet Take 1 tablet by mouth daily.    . Multiple Vitamin (MULTIVITAMIN WITH MINERALS) TABS tablet Take 1 tablet by mouth daily.    . nabumetone (RELAFEN) 750 MG tablet Take 1 tablet (750 mg total) by mouth 2 (two) times daily. One by mouth twice a day after eating. 60 tablet 5  . pantoprazole (PROTONIX) 20 MG tablet Take 1 tablet (20 mg total) by mouth 2 (two) times daily. 60 tablet 0  . potassium chloride SA (K-DUR,KLOR-CON) 20 MEQ tablet Take one tablet twice a day for five days, then one tablet a day 35 tablet 0  . sucralfate (CARAFATE) 1 g tablet Take 1 tablet (1 g total) by mouth 4 (four) times daily -  with meals and at bedtime for 7 days. 28 tablet 0   No current facility-administered medications for this visit.      Physical Exam  Blood pressure 107/68, pulse 70, height 5' 4.5" (1.638 m), weight 164 lb (74.4 kg), last menstrual period 08/12/2010.  Constitutional: overall normal hygiene, normal nutrition, well developed, normal grooming, normal body habitus. Assistive device:none  Musculoskeletal: gait and station Limp none, muscle tone and strength are normal, no tremors or atrophy is present.  .  Neurological: coordination overall normal.  Deep tendon reflex/nerve stretch intact.  Sensation normal.  Cranial nerves II-XII intact.   Skin:   Normal overall no scars, lesions, ulcers or rashes. No psoriasis.  Psychiatric: Alert and oriented x 3.  Recent memory intact, remote memory unclear.  Normal mood and affect. Well groomed.  Good eye contact.  Cardiovascular: overall no swelling, no varicosities, no edema bilaterally, normal temperatures of the legs and arms, no clubbing, cyanosis and good capillary  refill.  Lymphatic: palpation is normal.  Her hands have tenderness but no swelling of the MCP joints, more on the left today.  ROM is full.  NV intact.  All other systems reviewed and are negative   The patient has been educated about the nature of the problem(s) and counseled on treatment options.  The patient appeared to understand what I have discussed and is in agreement with it.  Encounter Diagnoses  Name Primary?  . Hand pain, right Yes  . Positive ANA (antinuclear antibody)   . Pain of left hand   . Cigarette nicotine dependence without complication     PLAN Call if any problems.  Precautions discussed.  Continue current medications.   Return to  clinic 3 months   Electronically Signed Sanjuana Kava, MD 9/17/20199:14 AM

## 2017-12-18 ENCOUNTER — Ambulatory Visit: Payer: BLUE CROSS/BLUE SHIELD | Admitting: Orthopaedic Surgery

## 2018-01-20 ENCOUNTER — Other Ambulatory Visit: Payer: Self-pay | Admitting: Orthopaedic Surgery

## 2018-01-20 MED ORDER — HYDROCODONE-ACETAMINOPHEN 7.5-325 MG PO TABS: 1.0000 | ORAL_TABLET | Freq: Four times a day (QID) | ORAL | 0 refills | Status: DC | PRN

## 2018-01-20 NOTE — Telephone Encounter (Signed)
Dr. Brooke Bonito patient  Hydrocodone-Acetaminophen 7.5/325 mg  Qty 50 Tablets  Take 1 tablet by mouth every 6 (six) hours as needed for moderate pain. (Must last 30 days)  PATIENT USES [facility]

## 2018-02-18 ENCOUNTER — Telehealth: Payer: Self-pay | Admitting: Orthopaedic Surgery

## 2018-02-18 MED ORDER — HYDROCODONE-ACETAMINOPHEN 7.5-325 MG PO TABS: 1.0000 | ORAL_TABLET | Freq: Four times a day (QID) | ORAL | 0 refills | Status: DC | PRN

## 2018-02-18 NOTE — Telephone Encounter (Signed)
Patient called for refill: HYDROcodone-acetaminophen (NORCO) 7.5-325 MG tablet 50 tablet   [employer]

## 2018-03-18 ENCOUNTER — Encounter: Payer: Self-pay | Admitting: Orthopaedic Surgery

## 2018-03-18 ENCOUNTER — Ambulatory Visit (INDEPENDENT_AMBULATORY_CARE_PROVIDER_SITE_OTHER): Payer: BLUE CROSS/BLUE SHIELD

## 2018-03-18 ENCOUNTER — Ambulatory Visit (INDEPENDENT_AMBULATORY_CARE_PROVIDER_SITE_OTHER): Payer: BLUE CROSS/BLUE SHIELD | Admitting: Orthopaedic Surgery

## 2018-03-18 VITALS — BP 114/72 | HR 74 | Ht 64.5 in | Wt 164.0 lb

## 2018-03-18 DIAGNOSIS — M25512 Pain in left shoulder: Secondary | ICD-10-CM

## 2018-03-18 DIAGNOSIS — F17210 Nicotine dependence, cigarettes, uncomplicated: Secondary | ICD-10-CM

## 2018-03-18 DIAGNOSIS — R768 Other specified abnormal immunological findings in serum: Secondary | ICD-10-CM

## 2018-03-18 MED ORDER — HYDROCODONE-ACETAMINOPHEN 7.5-325 MG PO TABS: 1.0000 | ORAL_TABLET | Freq: Four times a day (QID) | ORAL | 0 refills | Status: DC | PRN

## 2018-03-18 NOTE — Progress Notes (Signed)
Patient Amber Drake, female DOB:February 17, 1961, 57 y.o. QBH:[id number]  Chief Complaint  Patient presents with  . Hand Pain    bilateral  . Shoulder Pain    left    HPI  Amber Drake is a 57 y.o. female who has developed pain in the left shoulder.  She has pain with overhead use and rolling on it at night. She has no trauma.  She has positive ANA and has appointment to be seen at [facility] next Monday, 12/23.  She has no redness, no weakness of the shoulder.   Body mass index is 27.72 kg/m.  ROS  Review of Systems  HENT: Negative for congestion.   Respiratory: Negative for cough and shortness of breath.   Cardiovascular: Negative for chest pain and leg swelling.  Endocrine: Positive for cold intolerance.  Musculoskeletal: Positive for arthralgias.  Allergic/Immunologic: Positive for environmental allergies.  All other systems reviewed and are negative.   All other systems reviewed and are negative.  The following is a summary of the past history medically, past history surgically, known current medicines, social history and family history.  This information is gathered electronically by the computer from prior information and documentation.  I review this each visit and have found including this information at this point in the chart is beneficial and informative.    Past Medical History:  Diagnosis Date  . Anemia   . Arthritis   . Blood dyscrasia    sickle cell trait  . Carbuncle and furuncle   . Hypercholesterolemia   . Hypertension    does not take meds  . Sickle cell trait [facility])     Past Surgical History:  Procedure Laterality Date  . APPENDECTOMY    . CARPAL TUNNEL RELEASE  03/23/2011   Procedure: CARPAL TUNNEL RELEASE;  Surgeon: Sanjuana Kava;  Location: [facility];  Service: Orthopedics;  Laterality: Left;  . CARPAL TUNNEL RELEASE  05/10/2011   Procedure: CARPAL TUNNEL RELEASE;  Surgeon: Sanjuana Kava, MD;  Location: [facility];  Service: Orthopedics;  Laterality:  Right;  . CESAREAN SECTION     x 2  . INCISION AND DRAINAGE PERIRECTAL ABSCESS  12/21/09  . PROCTOSCOPY  10/17/2010   Procedure: PROCTOSCOPY;  Surgeon: Scherry Ran;  Location: [facility];  Service: General;  Laterality: N/A;  Rigid Proctoscopy/Possible Fistula in Ano  Procedure ended at 1003  . THERAPEUTIC ABORTION     x2    Family History  Problem Relation Age of Onset  . Kidney failure Daughter   . Sickle cell anemia Daughter   . Sickle cell trait Daughter   . Sickle cell anemia Son   . Hypertension Mother   . Hyperlipidemia Mother   . Hypertension Father   . Lupus Father        skin  . Hypertension Other   . Sarcoidosis Sister   . Anesthesia problems Neg Hx   . Hypotension Neg Hx   . Malignant hyperthermia Neg Hx   . Pseudochol deficiency Neg Hx     Social History Social History   Tobacco Use  . Smoking status: Current Every Day Smoker    Packs/day: 0.50    Years: 30.00    Pack years: 15.00    Types: Cigarettes  . Smokeless tobacco: Never Used  Substance Use Topics  . Alcohol use: No  . 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 atorvastatin (  LIPITOR) 20 MG tablet Take 1 tablet (20 mg total) by mouth daily. 90 tablet 2  . cyclobenzaprine (FLEXERIL) 10 MG tablet Take 1 tablet (10 mg total) by mouth 3 (three) times daily as needed for muscle spasms. (Patient taking differently: Take 10 mg by mouth 2 (two) times daily as needed for muscle spasms. ) 40 tablet 0  . ferrous sulfate 325 (65 FE) MG tablet Take 325 mg by mouth daily with breakfast.    . HYDROcodone-acetaminophen (NORCO) 7.5-325 MG tablet Take 1 tablet by mouth every 6 (six) hours as needed for moderate pain (Must last 30 days). 45 tablet 0  . lisinopril-hydrochlorothiazide (PRINZIDE,ZESTORETIC) 20-12.5 MG tablet Take 1 tablet by mouth daily.    . Multiple Vitamin (MULTIVITAMIN WITH MINERALS) TABS tablet Take 1 tablet by mouth  daily.    . nabumetone (RELAFEN) 750 MG tablet Take 1 tablet (750 mg total) by mouth 2 (two) times daily. One by mouth twice a day after eating. 60 tablet 5  . potassium chloride SA (K-DUR,KLOR-CON) 20 MEQ tablet Take one tablet twice a day for five days, then one tablet a day 35 tablet 0  . pantoprazole (PROTONIX) 20 MG tablet Take 1 tablet (20 mg total) by mouth 2 (two) times daily. 60 tablet 0  . sucralfate (CARAFATE) 1 g tablet Take 1 tablet (1 g total) by mouth 4 (four) times daily -  with meals and at bedtime for 7 days. 28 tablet 0   No current facility-administered medications for this visit.      Physical Exam  Blood pressure 114/72, pulse 74, height 5' 4.5" (1.638 m), weight 164 lb (74.4 kg), last menstrual period 08/12/2010.  Constitutional: overall normal hygiene, normal nutrition, well developed, normal grooming, normal body habitus. Assistive device:none  Musculoskeletal: gait and station Limp none, muscle tone and strength are normal, no tremors or atrophy is present.  .  Neurological: coordination overall normal.  Deep tendon reflex/nerve stretch intact.  Sensation normal.  Cranial nerves II-XII intact.   Skin:   Normal overall no scars, lesions, ulcers or rashes. No psoriasis.  Psychiatric: Alert and oriented x 3.  Recent memory intact, remote memory unclear.  Normal mood and affect. Well groomed.  Good eye contact.  Cardiovascular: overall no swelling, no varicosities, no edema bilaterally, normal temperatures of the legs and arms, no clubbing, cyanosis and good capillary refill.  Lymphatic: palpation is normal.  Examination of left Upper Extremity is done.  Inspection:   Overall:  Elbow non-tender without crepitus or defects, forearm non-tender without crepitus or defects, wrist non-tender without crepitus or defects, hand non-tender.    Shoulder: with glenohumeral joint tenderness, without effusion.   Upper arm: without swelling and tenderness   Range of  motion:   Overall:  Full range of motion of the elbow, full range of motion of wrist and full range of motion in fingers.   Shoulder:  left  165 degrees forward flexion; 150 degrees abduction; 35 degrees internal rotation, 35 degrees external rotation, 15 degrees extension, 40 degrees adduction.   Stability:   Overall:  Shoulder, elbow and wrist stable   Strength and Tone:   Overall full shoulder muscles strength, full upper arm strength and normal upper arm bulk and tone.  All other systems reviewed and are negative   The patient has been educated about the nature of the problem(s) and counseled on treatment options.  The patient appeared to understand what I have discussed and is in agreement with it.  Encounter  Diagnoses  Name Primary?  . Pain in joint of left shoulder Yes  . Positive ANA (antinuclear antibody)   . Cigarette nicotine dependence without complication    PROCEDURE NOTE:  The patient request injection, verbal consent was obtained.  The left shoulder was prepped appropriately after time out was performed.   Sterile technique was observed and injection of 1 cc of Depo-Medrol 40 mg with several cc's of plain xylocaine. Anesthesia was provided by ethyl chloride and a 20-gauge needle was used to inject the shoulder area. A posterior approach was used.  The injection was tolerated well.  A band aid dressing was applied.  The patient was advised to apply ice later today and tomorrow to the injection sight as needed.    PLAN Call if any problems.  Precautions discussed.  Continue current medications.   Return to clinic 6 weeks   I have reviewed the [facility] web site prior to prescribing narcotic medicine for this patient.   Electronically Signed Sanjuana Kava, MD 12/17/20199:40 AM

## 2018-03-24 DIAGNOSIS — G8929 Other chronic pain: Secondary | ICD-10-CM | POA: Diagnosis not present

## 2018-03-24 DIAGNOSIS — R7689 Other specified abnormal immunological findings in serum: Secondary | ICD-10-CM | POA: Insufficient documentation

## 2018-03-24 DIAGNOSIS — M25512 Pain in left shoulder: Secondary | ICD-10-CM | POA: Diagnosis not present

## 2018-03-24 DIAGNOSIS — R768 Other specified abnormal immunological findings in serum: Secondary | ICD-10-CM | POA: Insufficient documentation

## 2018-03-24 DIAGNOSIS — R3129 Other microscopic hematuria: Secondary | ICD-10-CM | POA: Diagnosis not present

## 2018-03-24 DIAGNOSIS — M25511 Pain in right shoulder: Secondary | ICD-10-CM | POA: Diagnosis not present

## 2018-03-24 DIAGNOSIS — G5603 Carpal tunnel syndrome, bilateral upper limbs: Secondary | ICD-10-CM | POA: Insufficient documentation

## 2018-04-02 HISTORY — PX: BRAIN SURGERY: SHX531

## 2018-04-17 ENCOUNTER — Telehealth: Payer: Self-pay | Admitting: Orthopaedic Surgery

## 2018-04-17 MED ORDER — HYDROCODONE-ACETAMINOPHEN 7.5-325 MG PO TABS: 1.0000 | ORAL_TABLET | Freq: Four times a day (QID) | ORAL | 0 refills | Status: DC | PRN

## 2018-04-17 NOTE — Telephone Encounter (Signed)
Patient called for refill:  HYDROcodone-acetaminophen (NORCO) 7.5-325 MG tablet 45 tablet  -[employer]   - aware of appointment later this month; also discussed coming in today for visit + refill; unable to come.

## 2018-04-18 ENCOUNTER — Other Ambulatory Visit: Payer: Self-pay

## 2018-04-18 ENCOUNTER — Emergency Department (HOSPITAL_COMMUNITY)
Admission: EM | Admit: 2018-04-18 | Discharge: 2018-04-18 | Discharge status: Against Medical Advice | Payer: BLUE CROSS/BLUE SHIELD | Attending: Emergency Medicine | Admitting: Emergency Medicine

## 2018-04-18 ENCOUNTER — Encounter (HOSPITAL_COMMUNITY): Payer: Self-pay | Admitting: *Deleted

## 2018-04-18 DIAGNOSIS — R51 Headache: Secondary | ICD-10-CM | POA: Insufficient documentation

## 2018-04-18 DIAGNOSIS — Z5321 Procedure and treatment not carried out due to patient leaving prior to being seen by health care provider: Secondary | ICD-10-CM | POA: Diagnosis not present

## 2018-04-18 NOTE — ED Notes (Signed)
Pt states she does not want to wait since being told there were several people ahead of her to be seen.   Pt states I will come back in the morning if needed

## 2018-04-18 NOTE — ED Notes (Signed)
Pt came to nurse's station and asked this RN how many people were in front of her? This RN stated she believed it was 3 people ahead of her, pt stated she was leaving then. This RN told pt it should not take too much longer for her to be seen, pt walked out

## 2018-04-18 NOTE — ED Notes (Signed)
Pt reports 2 week history of headache and "sinus" pressure, HA as well as reddend eyes  She is using Visine without improvement  She has Vicodin noted on meds, but says they are not to be filled until Weds  She is neuro intact, texting on cell phone, is non photophobic and has an odor of ETOH beverage

## 2018-04-18 NOTE — ED Triage Notes (Signed)
Pt c/o intermittent dizziness with headache, bilateral eye redness, left side toothache that started a week ago,

## 2018-04-29 ENCOUNTER — Other Ambulatory Visit (HOSPITAL_COMMUNITY): Payer: Self-pay | Admitting: Internal Medicine

## 2018-04-29 ENCOUNTER — Ambulatory Visit: Payer: BLUE CROSS/BLUE SHIELD | Admitting: Orthopaedic Surgery

## 2018-04-29 DIAGNOSIS — Z1231 Encounter for screening mammogram for malignant neoplasm of breast: Secondary | ICD-10-CM

## 2018-04-30 ENCOUNTER — Ambulatory Visit: Payer: BLUE CROSS/BLUE SHIELD | Admitting: Orthopaedic Surgery

## 2018-05-01 DIAGNOSIS — R630 Anorexia: Secondary | ICD-10-CM | POA: Diagnosis not present

## 2018-05-01 DIAGNOSIS — R300 Dysuria: Secondary | ICD-10-CM | POA: Diagnosis not present

## 2018-05-01 DIAGNOSIS — E785 Hyperlipidemia, unspecified: Secondary | ICD-10-CM | POA: Diagnosis not present

## 2018-05-01 DIAGNOSIS — I10 Essential (primary) hypertension: Secondary | ICD-10-CM | POA: Diagnosis not present

## 2018-05-01 DIAGNOSIS — E118 Type 2 diabetes mellitus with unspecified complications: Secondary | ICD-10-CM | POA: Diagnosis not present

## 2018-05-01 DIAGNOSIS — M1990 Unspecified osteoarthritis, unspecified site: Secondary | ICD-10-CM | POA: Diagnosis not present

## 2018-05-01 DIAGNOSIS — N390 Urinary tract infection, site not specified: Secondary | ICD-10-CM | POA: Diagnosis not present

## 2018-05-04 ENCOUNTER — Other Ambulatory Visit: Payer: Self-pay

## 2018-05-04 ENCOUNTER — Encounter (HOSPITAL_COMMUNITY): Payer: Self-pay | Admitting: *Deleted

## 2018-05-04 DIAGNOSIS — R51 Headache: Secondary | ICD-10-CM | POA: Diagnosis not present

## 2018-05-04 DIAGNOSIS — Z5321 Procedure and treatment not carried out due to patient leaving prior to being seen by health care provider: Secondary | ICD-10-CM | POA: Diagnosis not present

## 2018-05-04 NOTE — ED Triage Notes (Signed)
Pt c/o headache that started a week ago, bilateral eyes redness.

## 2018-05-05 ENCOUNTER — Emergency Department (HOSPITAL_COMMUNITY)
Admission: EM | Admit: 2018-05-05 | Discharge: 2018-05-05 | Disposition: A | Discharge status: Home / Self Care | Payer: BLUE CROSS/BLUE SHIELD | Attending: Emergency Medicine | Admitting: Emergency Medicine

## 2018-05-05 NOTE — ED Notes (Signed)
registrastion adivsed that pt has left and will come back in am,

## 2018-05-07 ENCOUNTER — Ambulatory Visit (HOSPITAL_COMMUNITY): Payer: BLUE CROSS/BLUE SHIELD

## 2018-05-12 ENCOUNTER — Ambulatory Visit (HOSPITAL_COMMUNITY): Payer: BLUE CROSS/BLUE SHIELD

## 2018-05-13 ENCOUNTER — Ambulatory Visit: Payer: BLUE CROSS/BLUE SHIELD | Admitting: Orthopaedic Surgery

## 2018-05-15 ENCOUNTER — Emergency Department (HOSPITAL_COMMUNITY)
Admission: EM | Admit: 2018-05-15 | Discharge: 2018-05-16 | Disposition: A | Discharge status: Home / Self Care | Payer: BLUE CROSS/BLUE SHIELD | Attending: Emergency Medicine | Admitting: Emergency Medicine

## 2018-05-15 ENCOUNTER — Encounter (HOSPITAL_COMMUNITY): Payer: Self-pay

## 2018-05-15 DIAGNOSIS — F17210 Nicotine dependence, cigarettes, uncomplicated: Secondary | ICD-10-CM | POA: Diagnosis not present

## 2018-05-15 DIAGNOSIS — Z79899 Other long term (current) drug therapy: Secondary | ICD-10-CM | POA: Diagnosis not present

## 2018-05-15 DIAGNOSIS — R319 Hematuria, unspecified: Secondary | ICD-10-CM | POA: Diagnosis not present

## 2018-05-15 DIAGNOSIS — D573 Sickle-cell trait: Secondary | ICD-10-CM | POA: Insufficient documentation

## 2018-05-15 DIAGNOSIS — I10 Essential (primary) hypertension: Secondary | ICD-10-CM | POA: Insufficient documentation

## 2018-05-15 DIAGNOSIS — R51 Headache: Secondary | ICD-10-CM

## 2018-05-15 DIAGNOSIS — R519 Headache, unspecified: Secondary | ICD-10-CM

## 2018-05-15 NOTE — ED Triage Notes (Addendum)
Pt reports headache x 2 weeks (none today), redness to her right eye, and blood in her urine (x 20 years).  Pt has been taking ibuprofen and tylenol for headache.

## 2018-05-16 LAB — URINALYSIS, ROUTINE W REFLEX MICROSCOPIC
BACTERIA UA: NONE SEEN
Bilirubin Urine: NEGATIVE
Glucose, UA: NEGATIVE mg/dL
Ketones, ur: NEGATIVE mg/dL
LEUKOCYTE UA: NEGATIVE
Nitrite: NEGATIVE
PROTEIN: NEGATIVE mg/dL
SPECIFIC GRAVITY, URINE: 1.012 (ref 1.005–1.030)
pH: 6 (ref 5.0–8.0)

## 2018-05-16 NOTE — ED Provider Notes (Signed)
[facility] EMERGENCY DEPARTMENT Provider Note   CSN: [id number] Arrival date & time: 05/15/18  2332     History   Chief Complaint Chief Complaint  Patient presents with  . Headache  . Hematuria    HPI Amber Drake is a 58 y.o. female.  Patient with history of sickle cell trait and hypertension presenting with multiple complaints.  States she is here because she is concerned about her eyes being red for the past several weeks.  She is been using over-the-counter eyedrops without relief.  Denies any change in her vision, photophobia, eye pain, drainage or bleeding.  She states over the past several weeks she has had a intermittent headache to the back of her head that feels like "nerve pain" that lasts for a few minutes at a time and improves with Tylenol.  There is no headache at this time.  No focal weakness, numbness, tingling.  No change in vision.  No difficulty speaking or difficulty swallowing.  States she gets the "nerve pain" in the back of her head when she coughs or laughs and only lasts for a few seconds or few minutes.  She is not having any pain at this time. Patient also mentions she is concerned about microscopic hematuria that she is had for several years.  She denies any pain with urination, abdominal pain or flank pain.  She denies any gross hematuria.  No chest pain or shortness of breath.  Patient notably with similar presentation on January 17 as well as February 3 but left without being seen both times.  The history is provided by the patient.  Headache  Associated symptoms: no abdominal pain, no back pain, no congestion, no cough, no dizziness, no eye pain, no myalgias, no nausea, no photophobia, no vomiting and no weakness   Hematuria  Associated symptoms include headaches. Pertinent negatives include no chest pain, no abdominal pain and no shortness of breath.    Past Medical History:  Diagnosis Date  . Anemia   . Arthritis   . Blood dyscrasia    sickle  cell trait  . Carbuncle and furuncle   . Hypercholesterolemia   . Hypertension    does not take meds  . Sickle cell trait [facility])     Patient Active Problem List   Diagnosis Date Noted  . Hidradenitis 07/31/2016  . Recurrent boils 07/31/2016  . Hyperlipidemia 06/15/2015  . Pain of left hand 05/19/2015  . Hand pain, right 05/19/2015  . Hematuria 03/03/2015  . Esophageal reflux 03/03/2015  . Essential hypertension, benign 03/03/2015  . Bronchitis due to tobacco use 10/17/2010    Past Surgical History:  Procedure Laterality Date  . APPENDECTOMY    . CARPAL TUNNEL RELEASE  03/23/2011   Procedure: CARPAL TUNNEL RELEASE;  Surgeon: Sanjuana Kava;  Location: [facility];  Service: Orthopedics;  Laterality: Left;  . CARPAL TUNNEL RELEASE  05/10/2011   Procedure: CARPAL TUNNEL RELEASE;  Surgeon: Sanjuana Kava, MD;  Location: [facility];  Service: Orthopedics;  Laterality: Right;  . CESAREAN SECTION     x 2  . INCISION AND DRAINAGE PERIRECTAL ABSCESS  12/21/09  . PROCTOSCOPY  10/17/2010   Procedure: PROCTOSCOPY;  Surgeon: Scherry Ran;  Location: [facility];  Service: General;  Laterality: N/A;  Rigid Proctoscopy/Possible Fistula in Ano  Procedure ended at 1003  . THERAPEUTIC ABORTION     x2     OB History    Gravida  4   Para  2   Term  2   Preterm      AB  2   Living  2     SAB      TAB      Ectopic      Multiple      Live Births               Home Medications    Prior to Admission medications   Medication Sig Start Date End Date Taking? Authorizing Provider  atorvastatin (LIPITOR) 20 MG tablet Take 1 tablet (20 mg total) by mouth daily. 03/14/15   Soyla Dryer, PA-C  cyclobenzaprine (FLEXERIL) 10 MG tablet Take 1 tablet (10 mg total) by mouth 3 (three) times daily as needed for muscle spasms. Patient taking differently: Take 10 mg by mouth 2 (two) times daily as needed for muscle spasms.  11/07/16   Sanjuana Kava, MD  ferrous sulfate 325 (65 FE) MG tablet  Take 325 mg by mouth daily with breakfast.    [provider]  HYDROcodone-acetaminophen (NORCO) 7.5-325 MG tablet Take 1 tablet by mouth every 6 (six) hours as needed for moderate pain (Must last 30 days). 04/17/18   Sanjuana Kava, MD  lisinopril-hydrochlorothiazide (PRINZIDE,ZESTORETIC) 20-12.5 MG tablet Take 1 tablet by mouth daily.    [provider]  Multiple Vitamin (MULTIVITAMIN WITH MINERALS) TABS tablet Take 1 tablet by mouth daily.    [provider]  nabumetone (RELAFEN) 750 MG tablet Take 1 tablet (750 mg total) by mouth 2 (two) times daily. One by mouth twice a day after eating. 11/07/16   Sanjuana Kava, MD  pantoprazole (PROTONIX) 20 MG tablet Take 1 tablet (20 mg total) by mouth 2 (two) times daily. 12/10/17 01/09/18  Long, Wonda Olds, MD  potassium chloride SA (K-DUR,KLOR-CON) 20 MEQ tablet Take one tablet twice a day for five days, then one tablet a day 04/29/76   Delora Fuel, MD  sucralfate (CARAFATE) 1 g tablet Take 1 tablet (1 g total) by mouth 4 (four) times daily -  with meals and at bedtime for 7 days. 12/10/17 12/17/17  Long, Wonda Olds, MD    Family History Family History  Problem Relation Age of Onset  . Kidney failure Daughter   . Sickle cell anemia Daughter   . Sickle cell trait Daughter   . Sickle cell anemia Son   . Hypertension Mother   . Hyperlipidemia Mother   . Hypertension Father   . Lupus Father        skin  . Hypertension Other   . Sarcoidosis Sister   . Anesthesia problems Neg Hx   . Hypotension Neg Hx   . Malignant hyperthermia Neg Hx   . Pseudochol deficiency Neg Hx     Social History Social History   Tobacco Use  . Smoking status: Current Every Day Smoker    Packs/day: 0.50    Years: 30.00    Pack years: 15.00    Types: Cigarettes  . Smokeless tobacco: Never Used  Substance Use Topics  . Alcohol use: No  . 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  HENT: Negative for congestion, rhinorrhea and sinus pain.   Eyes: Positive for redness. Negative for photophobia, pain, discharge, itching and visual disturbance.  Respiratory: Negative for cough, chest tightness and shortness of breath.  Cardiovascular: Negative for chest pain and leg swelling.  Gastrointestinal: Negative for abdominal pain, nausea and vomiting.  Genitourinary: Positive for hematuria.  Musculoskeletal: Negative for arthralgias, back pain and myalgias.  Neurological: Positive for headaches. Negative for dizziness, weakness and light-headedness.   all other systems are negative except as noted in the HPI and PMH.     Physical Exam Updated Vital Signs BP 106/72 (BP Location: Right Arm)   Pulse 77   Temp 97.7 F (36.5 C) (Oral)   Resp 16   Ht 5\' 8"  (1.727 m)   Wt 76.2 kg   LMP 08/12/2010   SpO2 99%   BMI 25.54 kg/m   Physical Exam Vitals signs and nursing note reviewed.  Constitutional:      General: She is not in acute distress.    Appearance: She is well-developed.  HENT:     Head: Normocephalic and atraumatic.     Comments: No temporal artery tenderness    Right Ear: Tympanic membrane normal.     Left Ear: Tympanic membrane normal.     Nose: Nose normal. No rhinorrhea.     Mouth/Throat:     Mouth: Mucous membranes are moist.     Pharynx: No oropharyngeal exudate.  Eyes:     General: Lids are normal. Vision grossly intact. Gaze aligned appropriately.     Extraocular Movements: Extraocular movements intact.     Conjunctiva/sclera: Conjunctivae normal.     Right eye: Right conjunctiva is not injected. No chemosis.    Left eye: Left conjunctiva is not injected. No chemosis.    Pupils: Pupils are equal, round, and reactive to light.     Comments: Eyes.  Grossly normal to inspection without evidence of erythema, conjunctivitis or  discharge. Pterygium present bilaterally  Neck:     Musculoskeletal: Normal range of motion and neck supple.     Comments: No meningismus. Cardiovascular:     Rate and Rhythm: Normal rate and regular rhythm.     Heart sounds: Normal heart sounds. No murmur.  Pulmonary:     Effort: Pulmonary effort is normal. No respiratory distress.     Breath sounds: Normal breath sounds.  Abdominal:     Palpations: Abdomen is soft.     Tenderness: There is no abdominal tenderness. There is no guarding or rebound.  Musculoskeletal: Normal range of motion.        General: No tenderness.     Comments: No CVAT  Skin:    General: Skin is warm.     Capillary Refill: Capillary refill takes less than 2 seconds.  Neurological:     General: No focal deficit present.     Mental Status: She is alert and oriented to person, place, and time. Mental status is at baseline.     Cranial Nerves: No cranial nerve deficit.     Motor: No abnormal muscle tone.     Coordination: Coordination normal.     Comments: No ataxia on finger to nose bilaterally. No pronator drift. 5/5 strength throughout. CN 2-12 intact.Equal grip strength. Sensation intact.   Psychiatric:        Mood and Affect: Mood normal.        Behavior: Behavior normal.      ED Treatments / Results  Labs (all labs ordered are listed, but only abnormal results are displayed) Labs Reviewed  URINALYSIS, ROUTINE W REFLEX MICROSCOPIC - Abnormal; Notable for the following components:      Result Value   Hgb urine dipstick MODERATE (*)  All other components within normal limits    EKG None  Radiology No results found.  Procedures Procedures (including critical care time)  Medications Ordered in ED Medications - No data to display   Initial Impression / Assessment and Plan / ED Course  I have reviewed the triage vital signs and the nursing notes.  Pertinent labs & imaging results that were available during my care of the patient were  reviewed by me and considered in my medical decision making (see chart for details).    Patient complains of eye irritation and redness though none is appreciated.  Her visual acuity is normal and she has no eye pain.  She has no headache at this time and has a nonfocal neurological exam.  She denies any eye pain or change in vision.  She declines checking of her eye pressures.  In terms of her hematuria, patient has had microscopic hematuria and UAs for several years.  She has no abdominal pain or flank pain.  She was told by her rheumatologist this could be due to her sickle cell trait. She had a CT scan in September 2019 that showed "Posterior wedge-shaped hypodensity right kidney on delayed imaging (series 3 images 21 through 23). Significance/etiology indeterminate. Question result of artifact, pyelonephritis, infarct or mass. This can be assessed with contrast enhanced renal MR at the time a MRCP". This could conceivably be contributing to her hematuria.  Patient with no headache at this time or vision change.  Her neurological exam is nonfocal.  Low suspicion for meningitis, temporal arteritis, subarachnoid hemorrhage.  She declines checking of her eye pressures but there is no change in vision and no eye pain so glaucoma is thought to be less likely. She wonders if her hair piece is too tight which she was told was a possibility. There is no indication for emergent neuro imaging today.  Patient's hematuria is a chronic issue.  She is informed of the CT scan results which she was not aware of.  Advised follow-up with urology for further evaluation and possible MRI as recommended by radiology.  Final Clinical Impressions(s) / ED Diagnoses   Final diagnoses:  Hematuria, unspecified type  Headache, unspecified headache type    ED Discharge Orders    None       Tra Wilemon, Annie Main, MD 05/16/18 [id number]

## 2018-05-16 NOTE — Discharge Instructions (Addendum)
Follow up with the urologist regarding the blood in the urine and the abnormality of your kidney that was seen on CT scan in September.  You should follow-up with your primary doctor and the neurologist.  Return to the ED if develop new or worsening symptoms.

## 2018-05-28 ENCOUNTER — Ambulatory Visit: Payer: BLUE CROSS/BLUE SHIELD | Admitting: Orthopaedic Surgery

## 2018-06-25 DIAGNOSIS — K610 Anal abscess: Secondary | ICD-10-CM | POA: Diagnosis not present

## 2018-06-25 DIAGNOSIS — L0292 Furuncle, unspecified: Secondary | ICD-10-CM | POA: Diagnosis not present

## 2018-07-21 ENCOUNTER — Emergency Department (HOSPITAL_COMMUNITY)
Admission: EM | Admit: 2018-07-21 | Discharge: 2018-07-21 | Discharge status: Against Medical Advice | Payer: BLUE CROSS/BLUE SHIELD | Attending: Emergency Medicine | Admitting: Emergency Medicine

## 2018-07-21 ENCOUNTER — Encounter (HOSPITAL_COMMUNITY): Payer: Self-pay | Admitting: Emergency Medicine

## 2018-07-21 ENCOUNTER — Other Ambulatory Visit: Payer: Self-pay

## 2018-07-21 ENCOUNTER — Emergency Department (HOSPITAL_COMMUNITY): Payer: BLUE CROSS/BLUE SHIELD

## 2018-07-21 DIAGNOSIS — G930 Cerebral cysts: Secondary | ICD-10-CM | POA: Diagnosis not present

## 2018-07-21 DIAGNOSIS — G939 Disorder of brain, unspecified: Secondary | ICD-10-CM | POA: Diagnosis not present

## 2018-07-21 DIAGNOSIS — I10 Essential (primary) hypertension: Secondary | ICD-10-CM | POA: Diagnosis not present

## 2018-07-21 DIAGNOSIS — G9389 Other specified disorders of brain: Secondary | ICD-10-CM | POA: Diagnosis not present

## 2018-07-21 DIAGNOSIS — R51 Headache: Secondary | ICD-10-CM

## 2018-07-21 DIAGNOSIS — R519 Headache, unspecified: Secondary | ICD-10-CM

## 2018-07-21 DIAGNOSIS — G936 Cerebral edema: Secondary | ICD-10-CM | POA: Diagnosis not present

## 2018-07-21 DIAGNOSIS — E785 Hyperlipidemia, unspecified: Secondary | ICD-10-CM | POA: Insufficient documentation

## 2018-07-21 DIAGNOSIS — F17210 Nicotine dependence, cigarettes, uncomplicated: Secondary | ICD-10-CM | POA: Insufficient documentation

## 2018-07-21 LAB — CBC WITH DIFFERENTIAL/PLATELET
Abs Immature Granulocytes: 0.01 10*3/uL (ref 0.00–0.07)
Basophils Absolute: 0 10*3/uL (ref 0.0–0.1)
Basophils Relative: 0 %
Eosinophils Absolute: 0.1 10*3/uL (ref 0.0–0.5)
Eosinophils Relative: 1 %
HCT: 34 % — ABNORMAL LOW (ref 36.0–46.0)
Hemoglobin: 11.1 g/dL — ABNORMAL LOW (ref 12.0–15.0)
Immature Granulocytes: 0 %
Lymphocytes Relative: 35 %
Lymphs Abs: 2.5 10*3/uL (ref 0.7–4.0)
MCH: 28.3 pg (ref 26.0–34.0)
MCHC: 32.6 g/dL (ref 30.0–36.0)
MCV: 86.7 fL (ref 80.0–100.0)
Monocytes Absolute: 0.4 10*3/uL (ref 0.1–1.0)
Monocytes Relative: 5 %
Neutro Abs: 4.1 10*3/uL (ref 1.7–7.7)
Neutrophils Relative %: 59 %
Platelets: 372 10*3/uL (ref 150–400)
RBC: 3.92 MIL/uL (ref 3.87–5.11)
RDW: 13 % (ref 11.5–15.5)
WBC: 7.1 10*3/uL (ref 4.0–10.5)
nRBC: 0 % (ref 0.0–0.2)

## 2018-07-21 LAB — BASIC METABOLIC PANEL
Anion gap: 8 (ref 5–15)
BUN: 12 mg/dL (ref 6–20)
CO2: 30 mmol/L (ref 22–32)
Calcium: 10 mg/dL (ref 8.9–10.3)
Chloride: 102 mmol/L (ref 98–111)
Creatinine, Ser: 0.74 mg/dL (ref 0.44–1.00)
GFR calc Af Amer: >60 mL/min (ref >60)
GFR calc non Af Amer: >60 mL/min (ref >60)
Glucose, Bld: 97 mg/dL (ref 70–99)
Potassium: 3.4 mmol/L — ABNORMAL LOW (ref 3.5–5.1)
Sodium: 140 mmol/L (ref 135–145)

## 2018-07-21 MED ORDER — GADOBUTROL 1 MMOL/ML IV SOLN
7.0000 mL | Freq: Once | INTRAVENOUS | Status: AC | PRN
  Administered 2018-07-21: 7 mL via INTRAVENOUS

## 2018-07-21 MED ORDER — DEXAMETHASONE SODIUM PHOSPHATE 4 MG/ML IJ SOLN
6.0000 mg | Freq: Three times a day (TID) | INTRAMUSCULAR | Status: DC
  Administered 2018-07-21: 19:00:00 6 mg via INTRAVENOUS
  Filled 2018-07-21: qty 2

## 2018-07-21 NOTE — ED Notes (Addendum)
Pt states she cannot stay due to daughter's dialysis appointment in the morning and does not have anyone else to care for her. Spoke with Dr Laverta Baltimore about dangers of leaving and is still adamant about leaving.

## 2018-07-21 NOTE — Discharge Instructions (Addendum)
You were seen in the emergency department today with headache.  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  We have given steroid but further work-up was recommended including hospitalization and consultation by multiple specialist.  This is a potentially life-threatening diagnosis that needs urgent evaluation.  You are choosing to leave the emergency department Amber Drake.  We discussed the risks of permanent disability and/or even death.  You are strongly encouraged to return to the emergency department as soon as you are able.  You are welcome to return at any time especially if your symptoms worsen significantly or you change your mind.

## 2018-07-21 NOTE — ED Provider Notes (Signed)
Emergency Department Provider Note   I have reviewed the triage vital signs and the nursing notes.   HISTORY  Chief Complaint Headache   HPI Amber Drake is a 58 y.o. female with PMH of HLD, HTN, and Sickle Cell trait presents to the emergency department for evaluation of headache.  Patient has had intermittent headache over the past month.  She states symptoms are worse with bending over and pain seems to radiate from the right side of the head down into the neck.  She denies any fevers or shaking chills.  No numbness or weakness.  No vision changes.  No head injury.  Eyes any sudden onset, maximal intensity headache symptoms.  No allergy type symptoms.  Patient is not currently experiencing headache.  She states that her main concern is determining whether or not there is a tumor causing her discomfort.  She reports that headache tends to resolve after taking over-the-counter pain medication. Also notes some stress at home.   Past Medical History:  Diagnosis Date   Anemia    Arthritis    Blood dyscrasia    sickle cell trait   Carbuncle and furuncle    Hypercholesterolemia    Hypertension    does not take meds   Sickle cell trait [facility])     Patient Active Problem List   Diagnosis Date Noted   Hidradenitis 07/31/2016   Recurrent boils 07/31/2016   Hyperlipidemia 06/15/2015   Pain of left hand 05/19/2015   Hand pain, right 05/19/2015   Hematuria 03/03/2015   Esophageal reflux 03/03/2015   Essential hypertension, benign 03/03/2015   Bronchitis due to tobacco use 10/17/2010    Past Surgical History:  Procedure Laterality Date   APPENDECTOMY     CARPAL TUNNEL RELEASE  03/23/2011   Procedure: CARPAL TUNNEL RELEASE;  Surgeon: Sanjuana Kava;  Location: [facility];  Service: Orthopedics;  Laterality: Left;   CARPAL TUNNEL RELEASE  05/10/2011   Procedure: CARPAL TUNNEL RELEASE;  Surgeon: Sanjuana Kava, MD;  Location: [facility];  Service: Orthopedics;  Laterality:  Right;   CESAREAN SECTION     x 2   INCISION AND DRAINAGE PERIRECTAL ABSCESS  12/21/09   PROCTOSCOPY  10/17/2010   Procedure: PROCTOSCOPY;  Surgeon: Scherry Ran;  Location: [facility];  Service: General;  Laterality: N/A;  Rigid Proctoscopy/Possible Fistula in Ano  Procedure ended at 1003   THERAPEUTIC ABORTION     x2    Allergies Penicillins; Penicillins cross reactors; and Tape  Family History  Problem Relation Age of Onset   Kidney failure Daughter    Sickle cell anemia Daughter    Sickle cell trait Daughter    Sickle cell anemia Son    Hypertension Mother    Hyperlipidemia Mother    Hypertension Father    Lupus Father        skin   Hypertension Other    Sarcoidosis Sister    Anesthesia problems Neg Hx    Hypotension Neg Hx    Malignant hyperthermia Neg Hx    Pseudochol deficiency Neg Hx     Social History Social History   Tobacco Use   Smoking status: Current Every Day Smoker    Packs/day: 0.50    Years: 30.00    Pack years: 15.00    Types: Cigarettes   Smokeless tobacco: Never Used  Substance Use Topics   Alcohol use: No   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 ENT: No sore throat. Cardiovascular: Denies chest pain. Respiratory: Denies shortness of breath. Gastrointestinal: No abdominal pain.  No nausea, no vomiting.  No diarrhea.  No constipation. Genitourinary: Negative for dysuria. Musculoskeletal: Negative for back pain. Skin: Negative for rash. Neurological: Negative for focal weakness or numbness. Positive HA.   10-point ROS otherwise negative.  ____________________________________________   PHYSICAL EXAM:  VITAL SIGNS: ED Triage Vitals  Enc Vitals Group     BP 07/21/18 1524 (!) 142/72     Pulse Rate 07/21/18 1524 84     Resp 07/21/18 1524 20     Temp 07/21/18 1524 98.5 F (36.9 C)     Temp Source 07/21/18 1524 Oral      SpO2 07/21/18 1524 100 %     Weight 07/21/18 1525 168 lb (76.2 kg)     Height 07/21/18 1525 5\' 7"  (1.702 m)     Pain Score 07/21/18 1525 3   Constitutional: Alert and oriented. Well appearing and in no acute distress. Eyes: Conjunctivae are normal. PERRL. EOMI. Head: Atraumatic. No tenderness over the temporal artery distribution.  Nose: No congestion/rhinnorhea. Mouth/Throat: Mucous membranes are moist.   Neck: No stridor.  No meningeal signs.   Cardiovascular: Normal rate, regular rhythm.  Respiratory: Normal respiratory effort.  Gastrointestinal: No distention.  Musculoskeletal: No gross deformities of extremities. Neurologic:  Normal speech and language. No gross focal neurologic deficits are appreciated.  Skin:  Skin is warm, dry and intact. No rash noted.   ____________________________________________  RADIOLOGY  Ct Head Wo Contrast  Result Date: 07/21/2018 CLINICAL DATA:  Headache 1 month EXAM: CT HEAD WITHOUT CONTRAST TECHNIQUE: Contiguous axial images were obtained from the base of the skull through the vertex without intravenous contrast. COMPARISON:  None. 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 Mass-effect in the posterior fossa with effacement of the prepontine cistern and cerebellar folia. Asymmetric hypodensity in the right medial cerebellum with mass-effect on the fourth ventricle. Cerebellar tonsils extend below the foramen magnum. Findings concerning for cerebellar mass. No associated hemorrhage. Ventricles may be slightly prominent possibly due to obstructive hydrocephalus. Rounded frontal horns. Vascular: Normal arterial flow voids Skull: Negative Sinuses/Orbits: Negative Other: None IMPRESSION: Hypodensity right medial cerebellum with mass-effect on the fourth ventricle and possible early hydrocephalus. Downward herniation of cerebellar tonsils. Findings concerning for cerebellar mass. 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 Electronically Signed   By: Franchot Gallo M.D.   On: 07/21/2018 16:19   Mr Jeri Cos And Wo Contrast  Result Date: 07/21/2018 CLINICAL DATA:  Headache.  Abnormal CT today EXAM: MRI HEAD WITHOUT AND WITH CONTRAST TECHNIQUE: Multiplanar, multiecho pulse sequences of the brain and surrounding structures were obtained without and with intravenous contrast. CONTRAST:  7 mL Gadovist IV COMPARISON:  CT head today FINDINGS: Brain: Image quality degraded by motion. Prominent dural thickening in the posterior fossa on the right surrounding the right cerebellar hemisphere and extending into the folia. Dural thickening is somewhat lobular and measures up to 11 mm in thickness. There is associated edema in the right cerebellum with mass-effect on the fourth ventricle as noted on CT. Fourth ventricle effaced with mild obstructive hydrocephalus. Edema extends into the cerebellar vermis and a small amount into the left cerebellum. Dural thickening extends into the spinal canal surrounding the spinal canal at the craniocervical junction. No significant dural  thickening in the posterior fossa on the left or in the cerebral hemispheres. Ventricle size mildly enlarged. 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 No acute infarct. No significant white matter disease in the cerebral hemispheres. Vascular: Normal arterial flow voids. Negative for venous sinus thrombosis. Skull and upper cervical spine: Negative Sinuses/Orbits: Negative Other: None IMPRESSION: Extensive dural thickening surrounding the right cerebellar hemisphere with mass-effect and edema in the right cerebellum. The process appears to extend into the upper cervical canal. Mild obstructive hydrocephalus. Differential includes tumor including metastatic disease and lymphoma. Chronic inflammatory process such as sarcoid is a consideration however chest x-ray  negative. TB and atypical infection/fungus also possible. 6 mm colloid cyst felt to be a separate problem. These results were called by telephone at the time of interpretation on 07/21/2018 at 6:01 pm to Dr. Nanda Quinton , who verbally acknowledged these results. Electronically Signed   By: Franchot Gallo M.D.   On: 07/21/2018 18:03    ____________________________________________   PROCEDURES  Procedure(s) performed:   Procedures  None ____________________________________________   INITIAL IMPRESSION / ASSESSMENT AND PLAN / ED COURSE  Pertinent labs & imaging results that were available during my care of the patient were reviewed by me and considered in my medical decision making (see chart for details).   Patient presents to the emergency department with 1 month of intermittent headache.  Began as diffuse and now localized in the right scalp.  No scalp tenderness or vision change to increase suspicion for temporal arteritis.  No sudden onset, maximal intensity headache symptoms to suspect SAH.  No infectious component to symptoms.  Plan for CT imaging with persistent HA symptoms and age along with smoking history.   04:26 PM  CT head results reviewed and discussed with patient. Have ordered MRI brain.   07:10 PM  Reviewed the MRI and blood work with the patient.  Discussed the case with neurosurgery.  Dr. Saintclair Halsted reviewed the MRI and case with the PA on-call.  They recommend admission for both neurology and neurosurgery consultation, 6 mg Decadron every 8 hours.  We did discuss this with the patient who tells me that she cannot stay in the hospital.  She states that her daughter requires dialysis and she must drive her there tomorrow morning.  Discussed the findings of her MRI and the recommendation to stay in the hospital and not go home.  We discussed finding other family members or friends who could drive her daughter to dialysis instead.  Patient states that she plans on coming back to the  emergency department tomorrow but cannot stay the night.  We discussed that making this decision could lead to permanent disability, seizure, coma, and/or death.  We discussed that this could delay making the eventual diagnosis.  Patient understands this.  Nurse present at bedside during conversation.  Patient plans to leave [place].  ____________________________________________  FINAL CLINICAL IMPRESSION(S) / ED DIAGNOSES  Final diagnoses:  Cerebellar mass  Acute nonintractable headache, unspecified headache type     MEDICATIONS GIVEN DURING THIS VISIT:  Medications  dexamethasone (DECADRON) injection 6 mg (has no administration in time range)  gadobutrol (GADAVIST) 1 MMOL/ML injection 7 mL (7 mLs Intravenous Contrast Given 07/21/18 1700)     Note:  This document was prepared using Dragon voice recognition software and may include unintentional dictation errors.  Nanda Quinton, MD Emergency Medicine    Placida Cambre, [facility], MD 07/21/18 [phone]

## 2018-07-21 NOTE — ED Triage Notes (Signed)
Patient complaining of right sided headache x 1 month worsening when bending over.

## 2018-07-24 ENCOUNTER — Other Ambulatory Visit (HOSPITAL_COMMUNITY): Payer: Self-pay | Admitting: Neurosurgery

## 2018-07-24 ENCOUNTER — Other Ambulatory Visit: Payer: Self-pay | Admitting: Neurosurgery

## 2018-07-24 DIAGNOSIS — G936 Cerebral edema: Secondary | ICD-10-CM

## 2018-07-25 ENCOUNTER — Encounter (HOSPITAL_COMMUNITY): Payer: Self-pay | Admitting: *Deleted

## 2018-07-25 ENCOUNTER — Other Ambulatory Visit: Payer: Self-pay

## 2018-07-25 ENCOUNTER — Ambulatory Visit (HOSPITAL_COMMUNITY)
Admission: RE | Admit: 2018-07-25 | Discharge: 2018-07-25 | Disposition: A | Discharge status: Home / Self Care | Payer: BLUE CROSS/BLUE SHIELD | Source: Ambulatory Visit | Attending: Neurosurgery | Admitting: Neurosurgery

## 2018-07-25 DIAGNOSIS — G936 Cerebral edema: Secondary | ICD-10-CM

## 2018-07-25 IMAGING — CT CT CHEST WITH CONTRAST
2 of 5 series · 12 of 36 positions shown, 15 images · IV contrast (Omni 300)
Comparison: MRI [DATE].  CT scan [DATE].

CLINICAL DATA: Cerebral edema.

EXAM:
CT CHEST, ABDOMEN, AND PELVIS WITH CONTRAST
TECHNIQUE: Multidetector CT imaging of the chest, abdomen and pelvis was
performed following the standard protocol during bolus
administration of intravenous contrast.
CONTRAST:  100mL OMNIPAQUE IOHEXOL 300 MG/ML  SOLN

[Series 3: cap with 5mm st · axial · 0.65mm/px · z∈[+868,+1378]mm · 9 of 128 slices shown, 12 images]
[im 13/128  mediastinal]
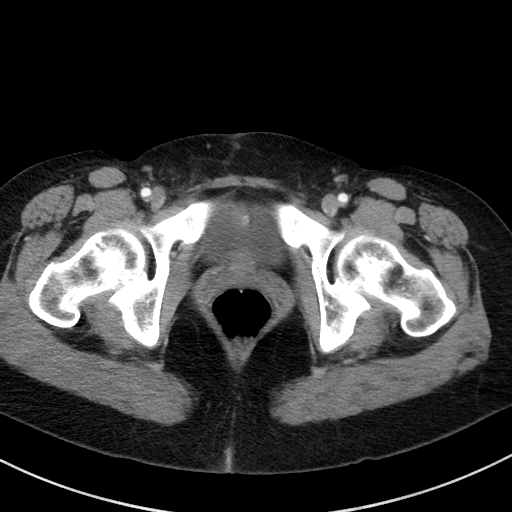
[im 13/128  lung]
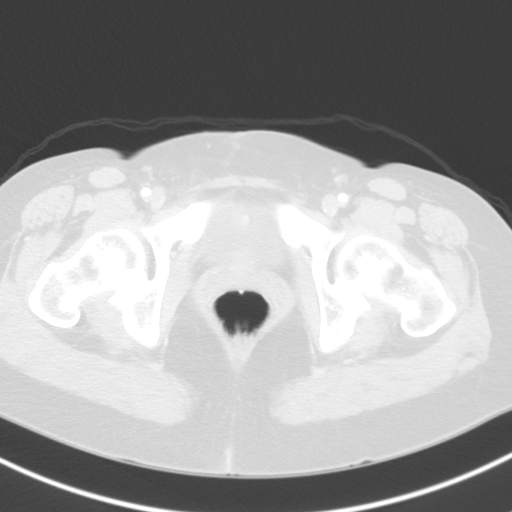
[im 26/128  lung]
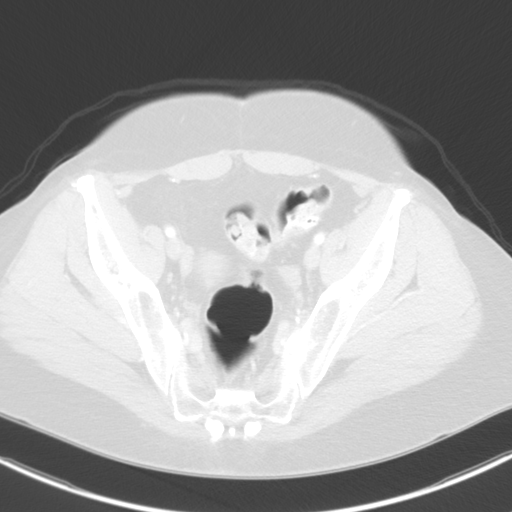
[im 39/128  lung]
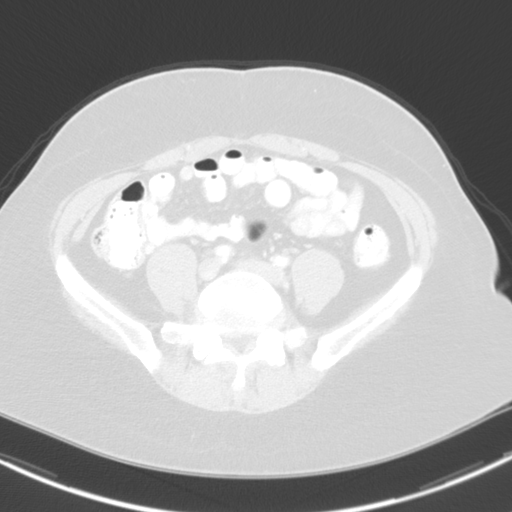
[im 51/128  lung]
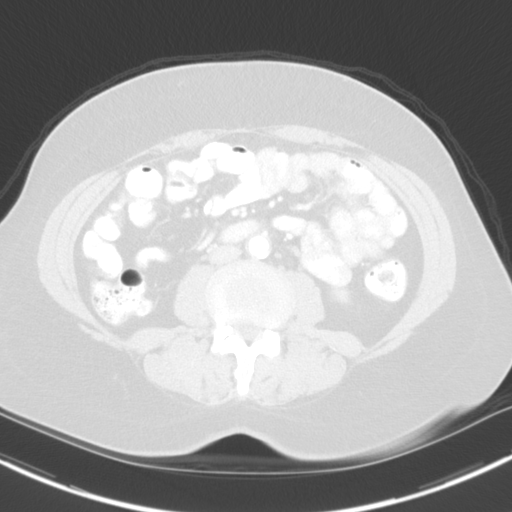
[im 64/128  mediastinal]
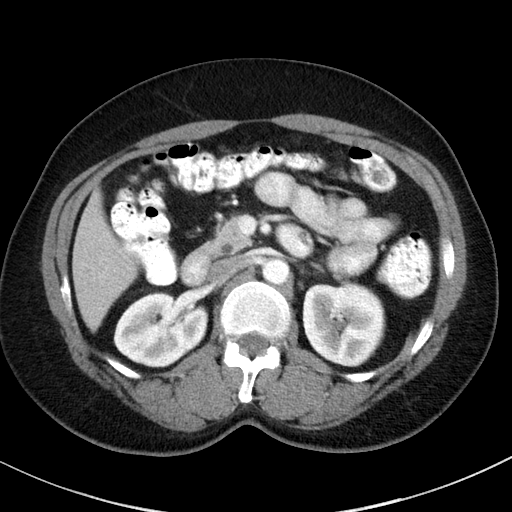
[im 64/128  lung]
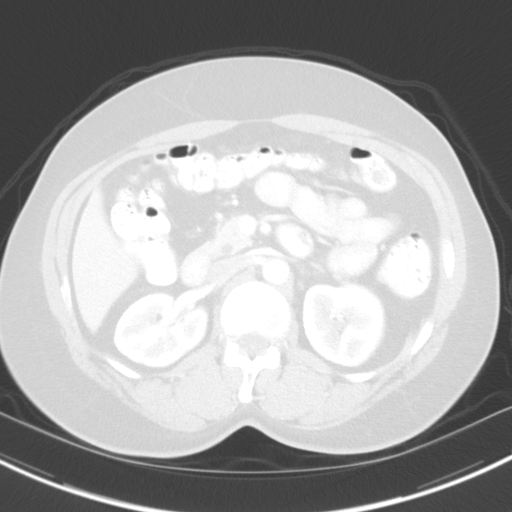
[im 77/128  lung]
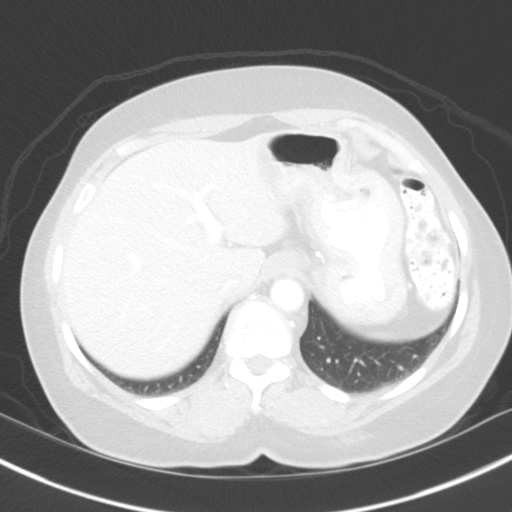
[im 89/128  lung]
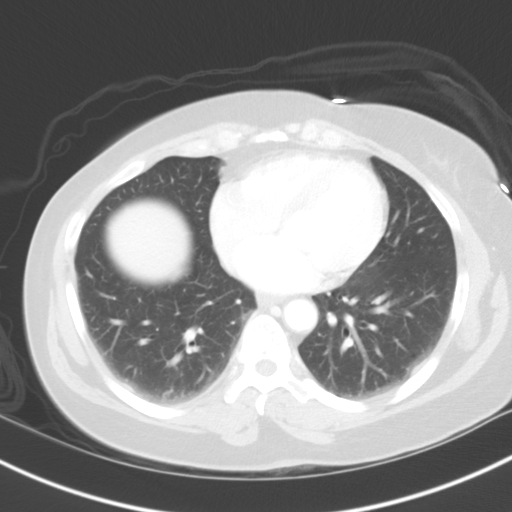
[im 102/128  lung]
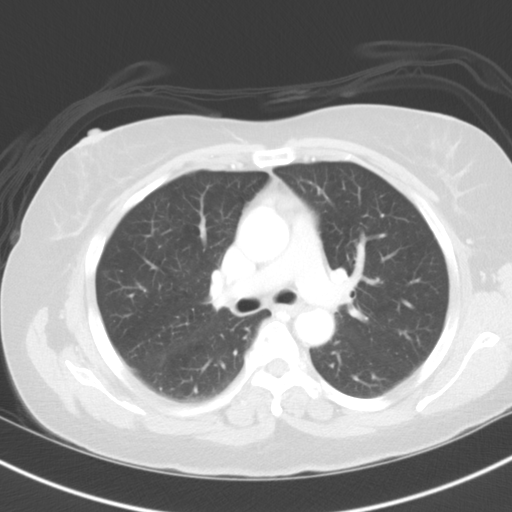
[im 115/128  mediastinal]
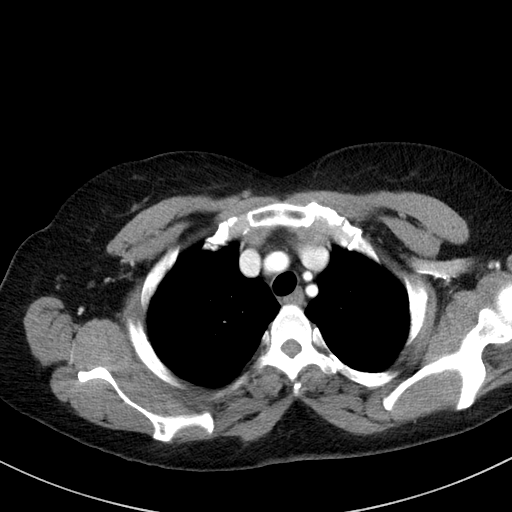
[im 115/128  lung]
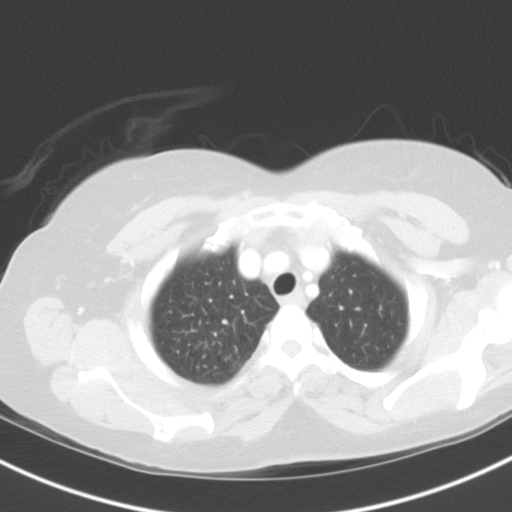

[Series 6: cap with 3mm st cor · coronal · 0.66mm/px · 3 of 128 slices shown]
[im 26/128  lung]
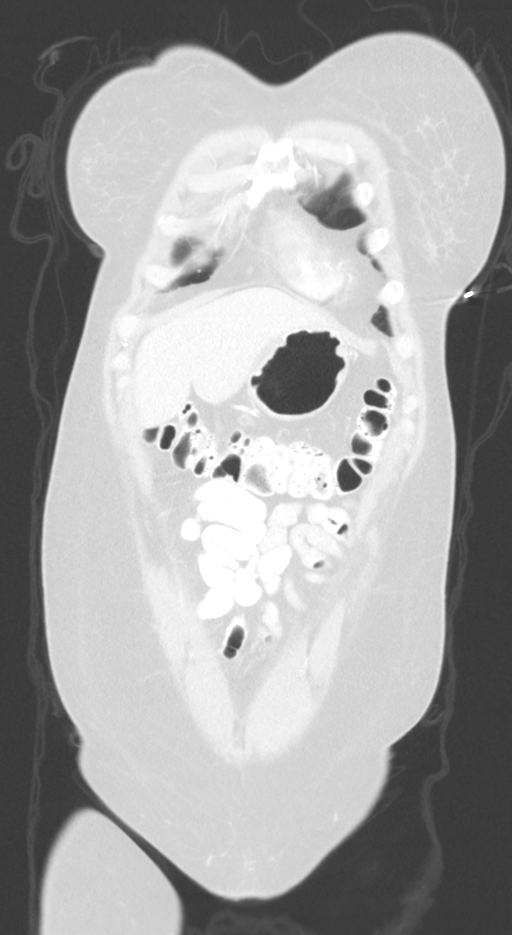
[im 51/128  lung]
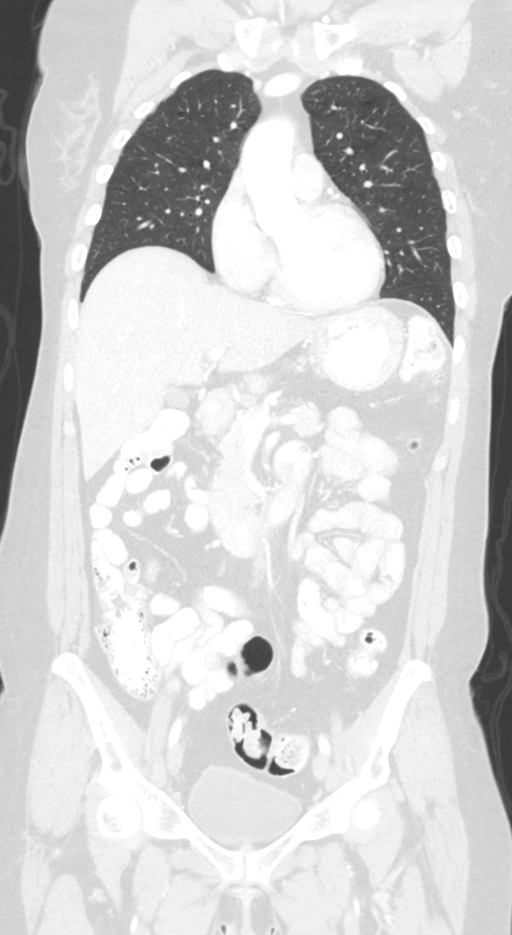
[im 77/128  lung]
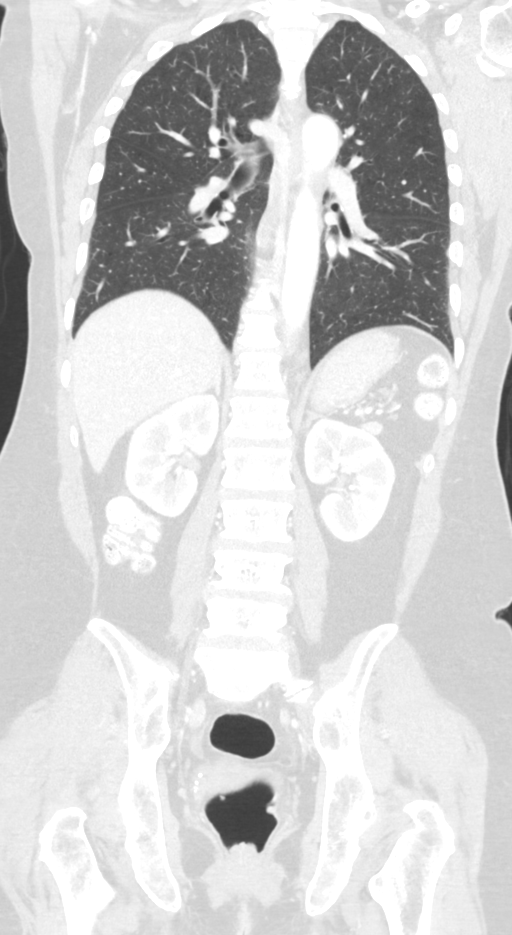

[12 of 36 positions shown; findings below may reference images not displayed]

FINDINGS: CT CHEST FINDINGS

Cardiovascular: No significant vascular findings. Normal heart size.
No pericardial effusion.

Mediastinum/Nodes: No enlarged mediastinal, hilar, or axillary lymph
nodes. Thyroid gland, trachea, and esophagus demonstrate no
significant findings.

Lungs/Pleura: Lungs are clear. No pleural effusion or pneumothorax.

Musculoskeletal: No chest wall mass or suspicious bone lesions
identified.

CT ABDOMEN PELVIS FINDINGS

Hepatobiliary: No focal liver abnormality is seen. No gallstones,
gallbladder wall thickening, or biliary dilatation.

Pancreas: Unremarkable. No pancreatic ductal dilatation or
surrounding inflammatory changes.

Spleen: Normal in size without focal abnormality.

Adrenals/Urinary Tract: Adrenal glands are unremarkable. Kidneys are
normal, without renal calculi, focal lesion, or hydronephrosis.
Bladder is unremarkable.

Stomach/Bowel: The stomach appears normal. There is no evidence of
bowel obstruction or inflammation. Status post appendectomy.

Vascular/Lymphatic: Aortic atherosclerosis. No enlarged abdominal or
pelvic lymph nodes.

Reproductive: Uterus and bilateral adnexa are unremarkable.

Other: No abdominal wall hernia or abnormality. No abdominopelvic
ascites.

Musculoskeletal: No acute or significant osseous findings.
IMPRESSION: No significant abnormality seen in the chest, abdomen or pelvis.

Aortic Atherosclerosis ([NK]-[NK]).

## 2018-07-25 MED ORDER — IOHEXOL 300 MG/ML  SOLN
100.0000 mL | Freq: Once | INTRAMUSCULAR | Status: AC | PRN
  Administered 2018-07-25: 100 mL via INTRAVENOUS

## 2018-07-25 NOTE — Progress Notes (Signed)
Spoke with pt for pre-op call. Pt denies cardiac history or diabetes. Is treated for HTN. Pt has Sickle Cell Trait.   Coronavirus Screening  Have you experienced the following symptoms:  Cough no Fever (>100.65F) no Runny nose no Sore throat no Difficulty breathing/shortness of breath  no  Have you or a family member traveled in the last 14 days and where? no

## 2018-07-28 ENCOUNTER — Inpatient Hospital Stay (HOSPITAL_COMMUNITY)
Admission: RE | Admit: 2018-07-28 | Discharge: 2018-07-29 | DRG: 027 | Disposition: A | Discharge status: Home / Self Care | Payer: BLUE CROSS/BLUE SHIELD | Attending: Neurosurgery | Admitting: Neurosurgery

## 2018-07-28 ENCOUNTER — Encounter (HOSPITAL_COMMUNITY)
Admission: RE | Disposition: A | Discharge status: Home / Self Care | Payer: Self-pay | Source: Home / Self Care | Attending: Neurosurgery

## 2018-07-28 ENCOUNTER — Inpatient Hospital Stay (HOSPITAL_COMMUNITY): Payer: BLUE CROSS/BLUE SHIELD

## 2018-07-28 ENCOUNTER — Inpatient Hospital Stay (HOSPITAL_COMMUNITY): Payer: BLUE CROSS/BLUE SHIELD | Admitting: Anesthesiology

## 2018-07-28 ENCOUNTER — Encounter (HOSPITAL_COMMUNITY): Payer: Self-pay | Admitting: *Deleted

## 2018-07-28 ENCOUNTER — Other Ambulatory Visit: Payer: Self-pay | Admitting: Radiation Therapy

## 2018-07-28 ENCOUNTER — Other Ambulatory Visit: Payer: Self-pay

## 2018-07-28 DIAGNOSIS — Z832 Family history of diseases of the blood and blood-forming organs and certain disorders involving the immune mechanism: Secondary | ICD-10-CM

## 2018-07-28 DIAGNOSIS — D496 Neoplasm of unspecified behavior of brain: Principal | ICD-10-CM | POA: Diagnosis present

## 2018-07-28 DIAGNOSIS — Z880 Allergy status to penicillin: Secondary | ICD-10-CM | POA: Diagnosis not present

## 2018-07-28 DIAGNOSIS — C719 Malignant neoplasm of brain, unspecified: Secondary | ICD-10-CM | POA: Diagnosis not present

## 2018-07-28 DIAGNOSIS — Z8249 Family history of ischemic heart disease and other diseases of the circulatory system: Secondary | ICD-10-CM | POA: Diagnosis not present

## 2018-07-28 DIAGNOSIS — C8519 Unspecified B-cell lymphoma, extranodal and solid organ sites: Secondary | ICD-10-CM | POA: Diagnosis not present

## 2018-07-28 DIAGNOSIS — F17210 Nicotine dependence, cigarettes, uncomplicated: Secondary | ICD-10-CM | POA: Diagnosis not present

## 2018-07-28 DIAGNOSIS — G930 Cerebral cysts: Secondary | ICD-10-CM | POA: Diagnosis not present

## 2018-07-28 DIAGNOSIS — Z79899 Other long term (current) drug therapy: Secondary | ICD-10-CM | POA: Diagnosis not present

## 2018-07-28 DIAGNOSIS — E7800 Pure hypercholesterolemia, unspecified: Secondary | ICD-10-CM | POA: Diagnosis not present

## 2018-07-28 DIAGNOSIS — D573 Sickle-cell trait: Secondary | ICD-10-CM | POA: Diagnosis present

## 2018-07-28 DIAGNOSIS — I10 Essential (primary) hypertension: Secondary | ICD-10-CM | POA: Diagnosis present

## 2018-07-28 DIAGNOSIS — D649 Anemia, unspecified: Secondary | ICD-10-CM | POA: Diagnosis present

## 2018-07-28 DIAGNOSIS — Z01818 Encounter for other preprocedural examination: Secondary | ICD-10-CM | POA: Diagnosis not present

## 2018-07-28 DIAGNOSIS — G936 Cerebral edema: Secondary | ICD-10-CM | POA: Diagnosis not present

## 2018-07-28 DIAGNOSIS — Z8349 Family history of other endocrine, nutritional and metabolic diseases: Secondary | ICD-10-CM | POA: Diagnosis not present

## 2018-07-28 DIAGNOSIS — E785 Hyperlipidemia, unspecified: Secondary | ICD-10-CM | POA: Diagnosis not present

## 2018-07-28 DIAGNOSIS — M1990 Unspecified osteoarthritis, unspecified site: Secondary | ICD-10-CM | POA: Diagnosis not present

## 2018-07-28 HISTORY — PX: APPLICATION OF CRANIAL NAVIGATION: SHX6578

## 2018-07-28 HISTORY — PX: PR DURAL GRAFT SPINAL: 63710

## 2018-07-28 LAB — TYPE AND SCREEN
ABO/RH(D): O POS
Antibody Screen: NEGATIVE

## 2018-07-28 LAB — BASIC METABOLIC PANEL
Anion gap: 12 (ref 5–15)
BUN: 11 mg/dL (ref 6–20)
CO2: 25 mmol/L (ref 22–32)
Calcium: 9.7 mg/dL (ref 8.9–10.3)
Chloride: 103 mmol/L (ref 98–111)
Creatinine, Ser: 0.89 mg/dL (ref 0.44–1.00)
GFR calc Af Amer: >60 mL/min (ref >60)
GFR calc non Af Amer: >60 mL/min (ref >60)
Glucose, Bld: 152 mg/dL — ABNORMAL HIGH (ref 70–99)
Potassium: 3.5 mmol/L (ref 3.5–5.1)
Sodium: 140 mmol/L (ref 135–145)

## 2018-07-28 LAB — CBC
HCT: 32.3 % — ABNORMAL LOW (ref 36.0–46.0)
Hemoglobin: 11.1 g/dL — ABNORMAL LOW (ref 12.0–15.0)
MCH: 29.1 pg (ref 26.0–34.0)
MCHC: 34.4 g/dL (ref 30.0–36.0)
MCV: 84.6 fL (ref 80.0–100.0)
Platelets: 374 10*3/uL (ref 150–400)
RBC: 3.82 MIL/uL — ABNORMAL LOW (ref 3.87–5.11)
RDW: 13 % (ref 11.5–15.5)
WBC: 14.2 10*3/uL — ABNORMAL HIGH (ref 4.0–10.5)
nRBC: 0 % (ref 0.0–0.2)

## 2018-07-28 LAB — MRSA PCR SCREENING: MRSA by PCR: NEGATIVE

## 2018-07-28 LAB — ABO/RH: ABO/RH(D): O POS

## 2018-07-28 IMAGING — CT CT HEAD WITHOUT CONTRAST
3 of 6 series · 15 of 47 positions shown, 18 images · non-contrast
Comparison: MRI brain [DATE]

CLINICAL DATA: Brain neoplasm. [REDACTED] study for preoperative
planning.

EXAM:
CT HEAD WITHOUT CONTRAST
TECHNIQUE: Contiguous axial images were obtained from the base of the skull
through the vertex without intravenous contrast.

[Series 5: head without cor · coronal · non-contrast · 0.31mm/px · 3 of 67 slices shown]
[im 23/67  brain]
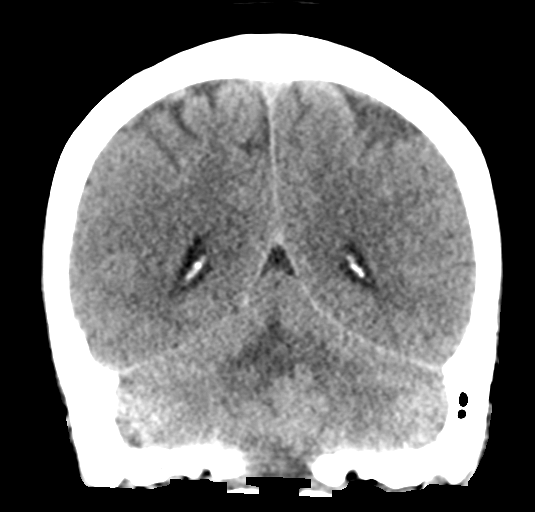
[im 30/67  brain]
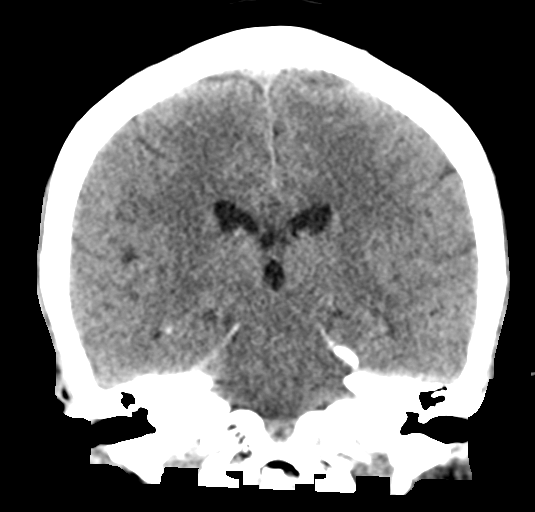
[im 37/67  brain]
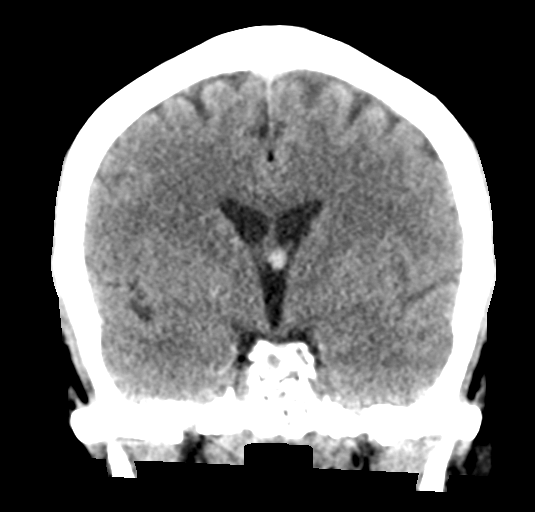

[Series 6: head without sag · sagittal · non-contrast · 0.31mm/px · 3 of 67 slices shown]
[im 23/67  brain]
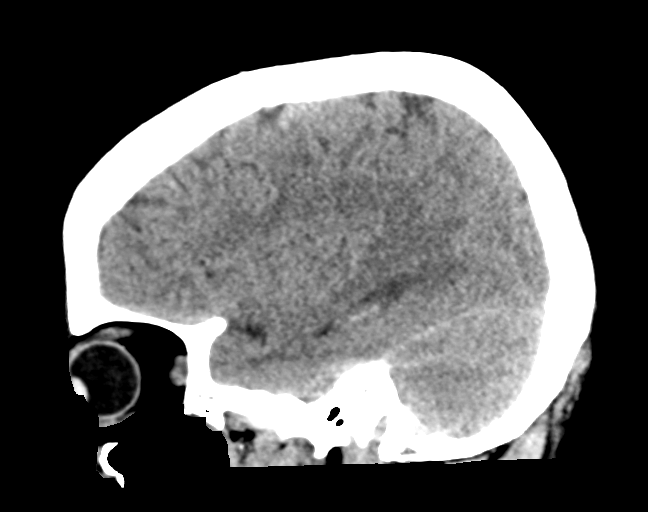
[im 34/67  brain]
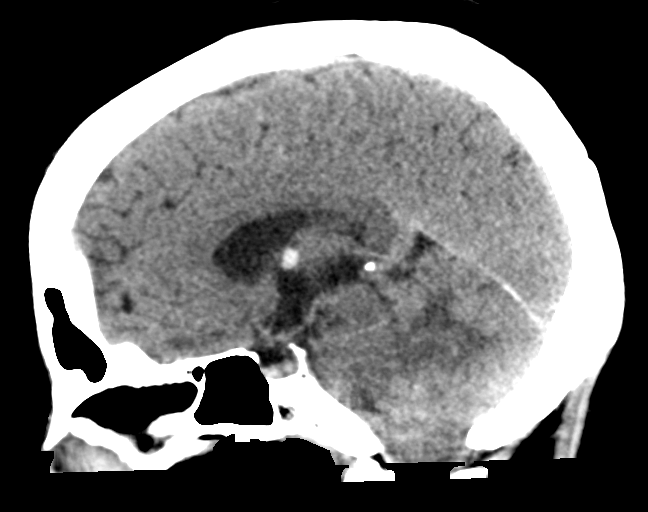
[im 45/67  brain]
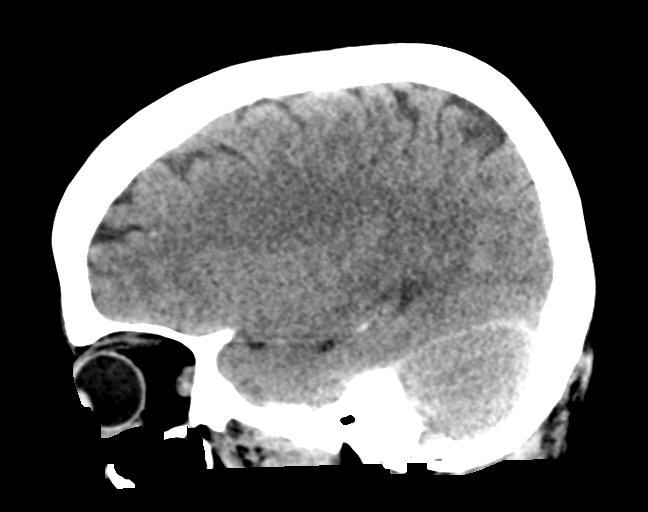

[Series 8: axial standard · axial · 0.49mm/px · z∈[+1177,+1310]mm · 9 of 167 slices shown, 12 images]
[im 17/167  brain]
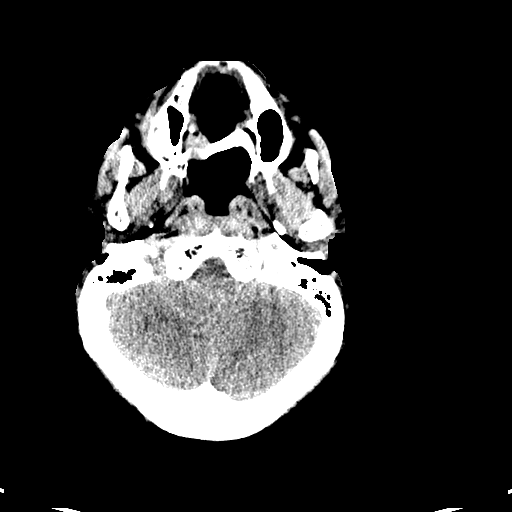
[im 17/167  bone]
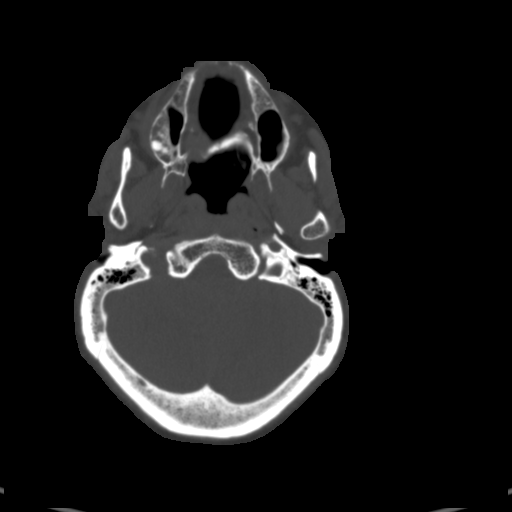
[im 34/167  brain]
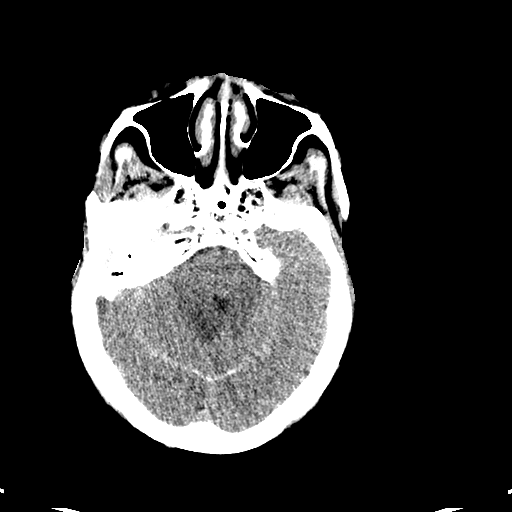
[im 50/167  brain]
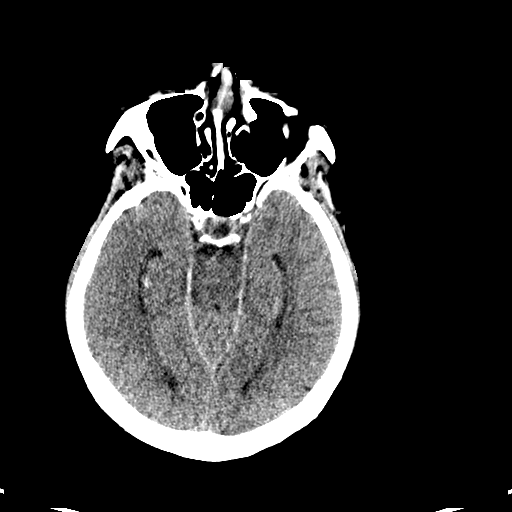
[im 67/167  brain]
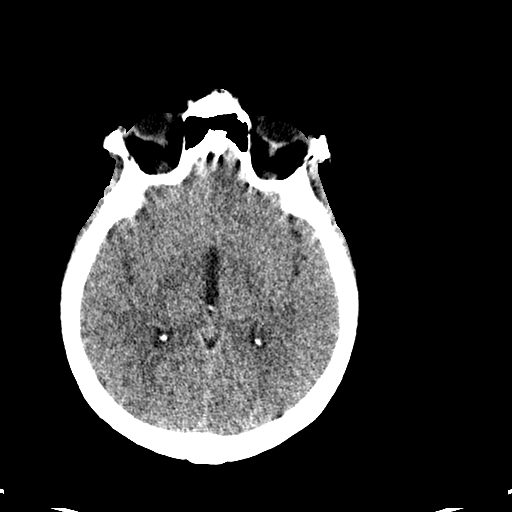
[im 84/167  brain]
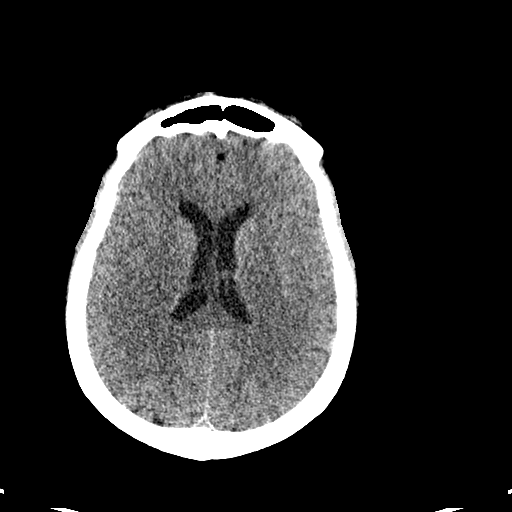
[im 84/167  bone]
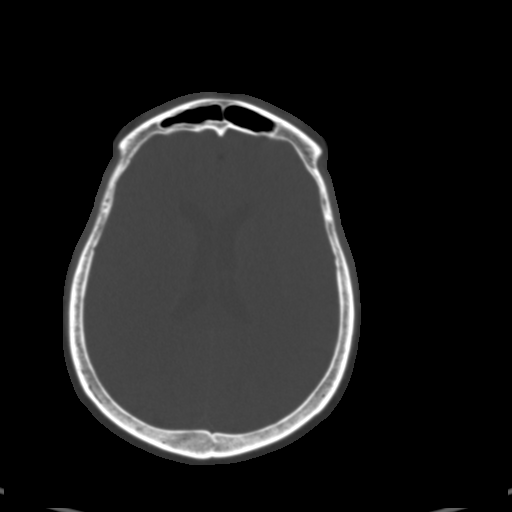
[im 100/167  brain]
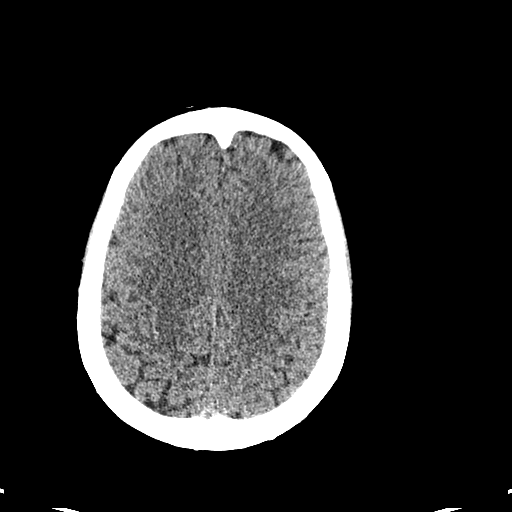
[im 117/167  brain]
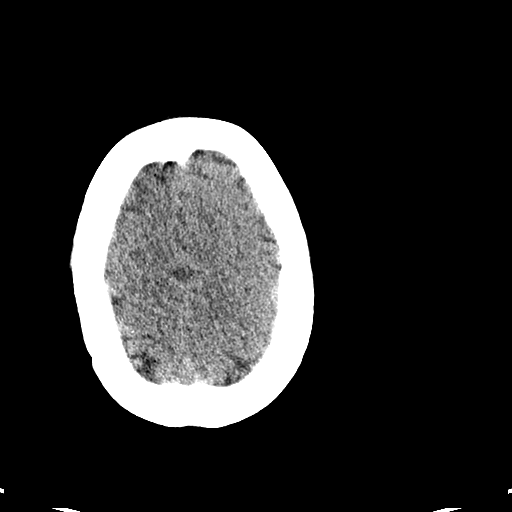
[im 133/167  brain]
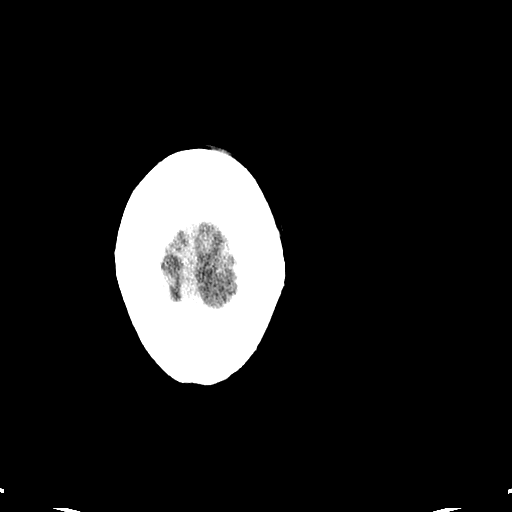
[im 150/167  brain]
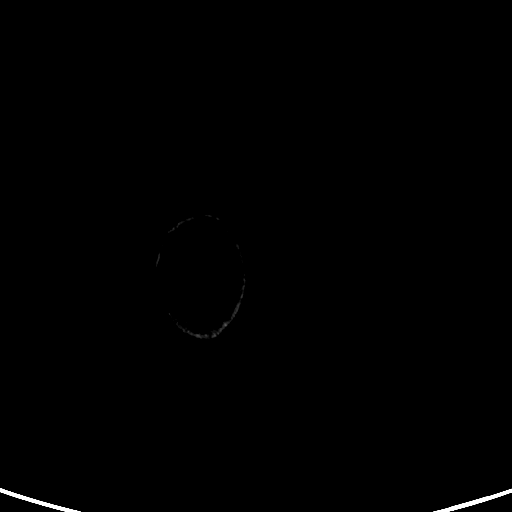
[im 150/167  bone]
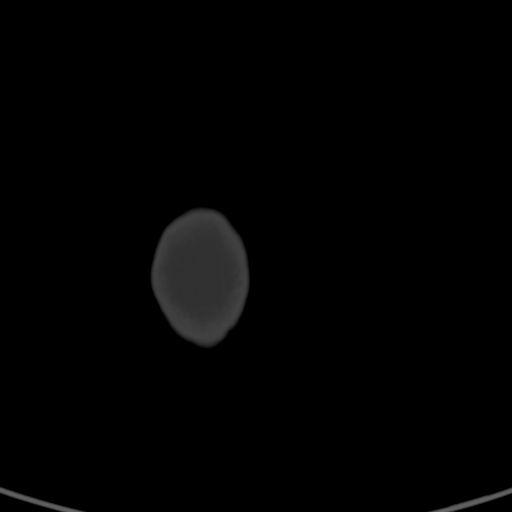

[15 of 47 positions shown; findings below may reference images not displayed]

FINDINGS: Brain: Asymmetric hypoattenuation involving the right cerebellar
hemisphere is again noted. Mass effect on the fourth ventricle and
extension of cerebellar tonsils into the foramen magnum is again
noted. Dural thickening is less well appreciated by CT.

A hyperdense 7 mm colloid cyst is again noted. Other acute
intracranial abnormality is evident. No other significant
extra-axial fluid collection is present. Ventricles are of normal
size.

Vascular: Atherosclerotic calcifications are present within the
cavernous internal carotid arteries bilaterally without a hyperdense
vessel.

Skull: Calvarium is intact. No focal lytic or blastic lesions are
present.

Sinuses/Orbits: The paranasal sinuses and mastoid air cells are
clear. The globes and orbits are within normal limits.
IMPRESSION: 1. Similar appearance of asymmetric hypoattenuation involving the
right cerebellum with mass effect on the posterior fossa.
2. Dural thickening about the right cerebellum is not well
appreciated by CT.
3. Colloid cyst.

## 2018-07-28 SURGERY — FRAMELESS STEREOTACTIC BIOPSY
Anesthesia: General

## 2018-07-28 MED ORDER — CHLORHEXIDINE GLUCONATE CLOTH 2 % EX PADS: 6.0000 | MEDICATED_PAD | Freq: Once | CUTANEOUS | Status: DC

## 2018-07-28 MED ORDER — LIDOCAINE 2% (20 MG/ML) 5 ML SYRINGE
INTRAMUSCULAR | Status: DC | PRN
  Administered 2018-07-28: 60 mg via INTRAVENOUS

## 2018-07-28 MED ORDER — ONDANSETRON HCL 4 MG/2ML IJ SOLN: 4.0000 mg | INTRAMUSCULAR | Status: DC | PRN

## 2018-07-28 MED ORDER — FENTANYL CITRATE (PF) 100 MCG/2ML IJ SOLN: 25.0000 ug | INTRAMUSCULAR | Status: DC | PRN

## 2018-07-28 MED ORDER — FENTANYL CITRATE (PF) 100 MCG/2ML IJ SOLN
INTRAMUSCULAR | Status: DC | PRN
  Administered 2018-07-28: 100 ug via INTRAVENOUS

## 2018-07-28 MED ORDER — DEXAMETHASONE SODIUM PHOSPHATE 10 MG/ML IJ SOLN
INTRAMUSCULAR | Status: DC | PRN
  Administered 2018-07-28: 5 mg via INTRAVENOUS
  Administered 2018-07-28: 10 mg via INTRAVENOUS

## 2018-07-28 MED ORDER — PHENYLEPHRINE 40 MCG/ML (10ML) SYRINGE FOR IV PUSH (FOR BLOOD PRESSURE SUPPORT)
PREFILLED_SYRINGE | INTRAVENOUS | Status: AC
  Filled 2018-07-28: qty 20

## 2018-07-28 MED ORDER — HYDROMORPHONE HCL 1 MG/ML IJ SOLN
0.5000 mg | INTRAMUSCULAR | Status: DC | PRN
  Administered 2018-07-28: 21:00:00 1 mg via INTRAVENOUS
  Filled 2018-07-28: qty 1

## 2018-07-28 MED ORDER — PHENYLEPHRINE 40 MCG/ML (10ML) SYRINGE FOR IV PUSH (FOR BLOOD PRESSURE SUPPORT)
PREFILLED_SYRINGE | INTRAVENOUS | Status: DC | PRN
  Administered 2018-07-28: 120 ug via INTRAVENOUS
  Administered 2018-07-28 (×4): 80 ug via INTRAVENOUS

## 2018-07-28 MED ORDER — PROMETHAZINE HCL 25 MG PO TABS: 12.5000 mg | ORAL_TABLET | ORAL | Status: DC | PRN

## 2018-07-28 MED ORDER — LIDOCAINE-EPINEPHRINE 1 %-1:100000 IJ SOLN
INTRAMUSCULAR | Status: DC | PRN
  Administered 2018-07-28: 3 mL

## 2018-07-28 MED ORDER — PROPOFOL 10 MG/ML IV BOLUS
INTRAVENOUS | Status: DC | PRN
  Administered 2018-07-28: 120 mg via INTRAVENOUS

## 2018-07-28 MED ORDER — LACTATED RINGERS IV SOLN
INTRAVENOUS | Status: DC
  Administered 2018-07-28: 06:00:00 via INTRAVENOUS

## 2018-07-28 MED ORDER — PROPOFOL 10 MG/ML IV BOLUS
INTRAVENOUS | Status: AC
  Filled 2018-07-28: qty 20

## 2018-07-28 MED ORDER — SODIUM CHLORIDE 0.9 % IV SOLN
INTRAVENOUS | Status: DC | PRN
  Administered 2018-07-28: 20 ug/min via INTRAVENOUS

## 2018-07-28 MED ORDER — OXYCODONE HCL 5 MG PO TABS: 5.0000 mg | ORAL_TABLET | Freq: Once | ORAL | Status: DC | PRN

## 2018-07-28 MED ORDER — BACITRACIN ZINC 500 UNIT/GM EX OINT
TOPICAL_OINTMENT | CUTANEOUS | Status: AC
  Filled 2018-07-28: qty 28.35

## 2018-07-28 MED ORDER — CYCLOBENZAPRINE HCL 10 MG PO TABS
10.0000 mg | ORAL_TABLET | Freq: Two times a day (BID) | ORAL | Status: DC | PRN
  Administered 2018-07-28: 10 mg via ORAL
  Filled 2018-07-28: qty 1

## 2018-07-28 MED ORDER — FENTANYL CITRATE (PF) 250 MCG/5ML IJ SOLN
INTRAMUSCULAR | Status: AC
  Filled 2018-07-28: qty 5

## 2018-07-28 MED ORDER — HYDROCODONE-ACETAMINOPHEN 5-325 MG PO TABS
1.0000 | ORAL_TABLET | ORAL | Status: DC | PRN
  Administered 2018-07-28: 14:00:00 1 via ORAL
  Filled 2018-07-28: qty 1

## 2018-07-28 MED ORDER — LISINOPRIL 20 MG PO TABS
20.0000 mg | ORAL_TABLET | Freq: Every day | ORAL | Status: DC
  Administered 2018-07-29: 09:00:00 20 mg via ORAL
  Filled 2018-07-28 (×2): qty 1

## 2018-07-28 MED ORDER — HEMOSTATIC AGENTS (NO CHARGE) OPTIME
TOPICAL | Status: DC | PRN
  Administered 2018-07-28: 1 via TOPICAL

## 2018-07-28 MED ORDER — PANTOPRAZOLE SODIUM 40 MG IV SOLR
40.0000 mg | Freq: Every day | INTRAVENOUS | Status: DC
  Filled 2018-07-28 (×2): qty 40

## 2018-07-28 MED ORDER — ATORVASTATIN CALCIUM 10 MG PO TABS
20.0000 mg | ORAL_TABLET | Freq: Every day | ORAL | Status: DC
  Administered 2018-07-29: 09:00:00 20 mg via ORAL
  Filled 2018-07-28 (×2): qty 2

## 2018-07-28 MED ORDER — LIDOCAINE-EPINEPHRINE 1 %-1:100000 IJ SOLN
INTRAMUSCULAR | Status: AC
  Filled 2018-07-28: qty 1

## 2018-07-28 MED ORDER — 0.9 % SODIUM CHLORIDE (POUR BTL) OPTIME
TOPICAL | Status: DC | PRN
  Administered 2018-07-28: 2000 mL

## 2018-07-28 MED ORDER — LEVETIRACETAM IN NACL 500 MG/100ML IV SOLN
500.0000 mg | Freq: Two times a day (BID) | INTRAVENOUS | Status: DC
  Administered 2018-07-28 – 2018-07-29 (×3): 500 mg via INTRAVENOUS
  Filled 2018-07-28 (×3): qty 100

## 2018-07-28 MED ORDER — ROCURONIUM BROMIDE 50 MG/5ML IV SOSY
PREFILLED_SYRINGE | INTRAVENOUS | Status: DC | PRN
  Administered 2018-07-28: 50 mg via INTRAVENOUS

## 2018-07-28 MED ORDER — LABETALOL HCL 5 MG/ML IV SOLN: 10.0000 mg | INTRAVENOUS | Status: DC | PRN

## 2018-07-28 MED ORDER — DEXAMETHASONE SODIUM PHOSPHATE 10 MG/ML IJ SOLN
20.0000 mg | Freq: Four times a day (QID) | INTRAMUSCULAR | Status: AC
  Administered 2018-07-28 – 2018-07-29 (×4): 20 mg via INTRAVENOUS
  Filled 2018-07-28 (×4): qty 2

## 2018-07-28 MED ORDER — POTASSIUM CHLORIDE IN NACL 20-0.9 MEQ/L-% IV SOLN
INTRAVENOUS | Status: DC
  Administered 2018-07-28: 13:00:00 via INTRAVENOUS
  Filled 2018-07-28: qty 1000

## 2018-07-28 MED ORDER — OXYCODONE HCL 5 MG/5ML PO SOLN: 5.0000 mg | Freq: Once | ORAL | Status: DC | PRN

## 2018-07-28 MED ORDER — SUCCINYLCHOLINE CHLORIDE 20 MG/ML IJ SOLN
INTRAMUSCULAR | Status: DC | PRN
  Administered 2018-07-28: 140 mg via INTRAVENOUS

## 2018-07-28 MED ORDER — VANCOMYCIN HCL IN DEXTROSE 1-5 GM/200ML-% IV SOLN
1000.0000 mg | Freq: Two times a day (BID) | INTRAVENOUS | Status: AC
  Administered 2018-07-28 – 2018-07-29 (×2): 1000 mg via INTRAVENOUS
  Filled 2018-07-28 (×2): qty 200

## 2018-07-28 MED ORDER — LISINOPRIL-HYDROCHLOROTHIAZIDE 20-12.5 MG PO TABS: 1.0000 | ORAL_TABLET | Freq: Every day | ORAL | Status: DC

## 2018-07-28 MED ORDER — BACITRACIN ZINC 500 UNIT/GM EX OINT
TOPICAL_OINTMENT | CUTANEOUS | Status: DC | PRN
  Administered 2018-07-28: 1 via TOPICAL

## 2018-07-28 MED ORDER — THROMBIN 5000 UNITS EX SOLR
CUTANEOUS | Status: DC | PRN
  Administered 2018-07-28: 10000 [IU] via TOPICAL

## 2018-07-28 MED ORDER — MIDAZOLAM HCL 2 MG/2ML IJ SOLN
INTRAMUSCULAR | Status: AC
  Filled 2018-07-28: qty 2

## 2018-07-28 MED ORDER — ONDANSETRON HCL 4 MG/2ML IJ SOLN
INTRAMUSCULAR | Status: DC | PRN
  Administered 2018-07-28: 4 mg via INTRAVENOUS

## 2018-07-28 MED ORDER — SUGAMMADEX SODIUM 200 MG/2ML IV SOLN
INTRAVENOUS | Status: DC | PRN
  Administered 2018-07-28 (×2): 100 mg via INTRAVENOUS

## 2018-07-28 MED ORDER — ONDANSETRON HCL 4 MG PO TABS
4.0000 mg | ORAL_TABLET | ORAL | Status: DC | PRN
  Administered 2018-07-28: 4 mg via ORAL
  Filled 2018-07-28: qty 1

## 2018-07-28 MED ORDER — CEFAZOLIN SODIUM-DEXTROSE 2-4 GM/100ML-% IV SOLN: 2.0000 g | Freq: Three times a day (TID) | INTRAVENOUS | Status: DC

## 2018-07-28 MED ORDER — ONDANSETRON HCL 4 MG/2ML IJ SOLN: 4.0000 mg | Freq: Once | INTRAMUSCULAR | Status: DC | PRN

## 2018-07-28 MED ORDER — VANCOMYCIN HCL IN DEXTROSE 1-5 GM/200ML-% IV SOLN
1000.0000 mg | INTRAVENOUS | Status: AC
  Administered 2018-07-28: 1000 mg via INTRAVENOUS
  Filled 2018-07-28: qty 200

## 2018-07-28 MED ORDER — FAMOTIDINE 20 MG PO TABS
40.0000 mg | ORAL_TABLET | Freq: Two times a day (BID) | ORAL | Status: DC
  Administered 2018-07-28 – 2018-07-29 (×3): 40 mg via ORAL
  Filled 2018-07-28 (×3): qty 2

## 2018-07-28 MED ORDER — MIDAZOLAM HCL 5 MG/5ML IJ SOLN
INTRAMUSCULAR | Status: DC | PRN
  Administered 2018-07-28: 2 mg via INTRAVENOUS

## 2018-07-28 MED ORDER — THROMBIN 5000 UNITS EX SOLR
CUTANEOUS | Status: AC
  Filled 2018-07-28: qty 15000

## 2018-07-28 MED ORDER — FERROUS SULFATE 325 (65 FE) MG PO TABS
325.0000 mg | ORAL_TABLET | Freq: Every day | ORAL | Status: DC
  Administered 2018-07-29: 09:00:00 325 mg via ORAL
  Filled 2018-07-28: qty 1

## 2018-07-28 MED ORDER — DOCUSATE SODIUM 100 MG PO CAPS
100.0000 mg | ORAL_CAPSULE | Freq: Two times a day (BID) | ORAL | Status: DC
  Administered 2018-07-28 – 2018-07-29 (×3): 100 mg via ORAL
  Filled 2018-07-28 (×3): qty 1

## 2018-07-28 MED ORDER — HYDROCHLOROTHIAZIDE 12.5 MG PO CAPS
12.5000 mg | ORAL_CAPSULE | Freq: Every day | ORAL | Status: DC
  Administered 2018-07-29: 09:00:00 12.5 mg via ORAL
  Filled 2018-07-28 (×2): qty 1

## 2018-07-28 MED ORDER — ADULT MULTIVITAMIN W/MINERALS CH
1.0000 | ORAL_TABLET | Freq: Every day | ORAL | Status: DC
  Administered 2018-07-28 – 2018-07-29 (×2): 1 via ORAL
  Filled 2018-07-28 (×2): qty 1

## 2018-07-28 SURGICAL SUPPLY — 64 items
BANDAGE ADH SHEER 1  50/CT (GAUZE/BANDAGES/DRESSINGS) IMPLANT
BLADE CLIPPER SURG (BLADE) IMPLANT
BLADE SURG 10 STRL SS (BLADE) ×1 IMPLANT
BLADE SURG 15 STRL LF DISP TIS (BLADE) ×1 IMPLANT
BLADE SURG 15 STRL SS (BLADE)
BUR ACORN 6.0 (BURR) ×2 IMPLANT
BUR ACORN 9.0 PRECISION (BURR) ×1 IMPLANT
CABLE BIPOLOR RESECTION CORD (MISCELLANEOUS) ×1 IMPLANT
CANISTER SUCT 3000ML PPV (MISCELLANEOUS) ×2 IMPLANT
CARTRIDGE OIL MAESTRO DRILL (MISCELLANEOUS) ×1 IMPLANT
COVER MAYO STAND STRL (DRAPES) ×1 IMPLANT
COVER WAND RF STERILE (DRAPES) ×1 IMPLANT
DIFFUSER DRILL AIR PNEUMATIC (MISCELLANEOUS) ×2 IMPLANT
DRAPE INCISE IOBAN 66X45 STRL (DRAPES) ×2 IMPLANT
DRAPE NEUROLOGICAL W/INCISE (DRAPES) ×1 IMPLANT
DRAPE POUCH INSTRU U-SHP 10X18 (DRAPES) ×2 IMPLANT
DRAPE WARM FLUID 44X44 (DRAPE) ×1 IMPLANT
DRSG OPSITE POSTOP 4X6 (GAUZE/BANDAGES/DRESSINGS) ×1 IMPLANT
DURAPREP 26ML APPLICATOR (WOUND CARE) ×1 IMPLANT
DURAPREP 6ML APPLICATOR 50/CS (WOUND CARE) ×1 IMPLANT
ELECT CAUTERY BLADE 6.4 (BLADE) ×2 IMPLANT
ELECT REM PT RETURN 9FT ADLT (ELECTROSURGICAL) ×2
ELECTRODE REM PT RTRN 9FT ADLT (ELECTROSURGICAL) ×1 IMPLANT
GAUZE 4X4 16PLY RFD (DISPOSABLE) ×2 IMPLANT
GLOVE BIO SURGEON STRL SZ7 (GLOVE) IMPLANT
GLOVE BIO SURGEON STRL SZ8 (GLOVE) ×2 IMPLANT
GLOVE BIOGEL PI IND STRL 7.0 (GLOVE) IMPLANT
GLOVE BIOGEL PI INDICATOR 7.0 (GLOVE)
GLOVE EXAM NITRILE XL STR (GLOVE) IMPLANT
GLOVE INDICATOR 8.5 STRL (GLOVE) ×2 IMPLANT
GOWN STRL REUS W/ TWL LRG LVL3 (GOWN DISPOSABLE) IMPLANT
GOWN STRL REUS W/ TWL XL LVL3 (GOWN DISPOSABLE) IMPLANT
GOWN STRL REUS W/TWL 2XL LVL3 (GOWN DISPOSABLE) ×1 IMPLANT
GOWN STRL REUS W/TWL LRG LVL3 (GOWN DISPOSABLE)
GOWN STRL REUS W/TWL XL LVL3 (GOWN DISPOSABLE) ×4
GRAFT DURAGEN MATRIX 1WX1L (Tissue) ×1 IMPLANT
HEMOSTAT SURGICEL 2X14 (HEMOSTASIS) ×1 IMPLANT
KIT BASIN OR (CUSTOM PROCEDURE TRAY) ×2 IMPLANT
KIT TURNOVER KIT B (KITS) ×2 IMPLANT
MARKER SPHERE PSV REFLC 13MM (MARKER) ×4 IMPLANT
NDL HYPO 25X1 1.5 SAFETY (NEEDLE) ×1 IMPLANT
NEEDLE HYPO 25X1 1.5 SAFETY (NEEDLE) ×2 IMPLANT
NS IRRIG 1000ML POUR BTL (IV SOLUTION) ×2 IMPLANT
OIL CARTRIDGE MAESTRO DRILL (MISCELLANEOUS) ×2
PACK CRANIOTOMY CUSTOM (CUSTOM PROCEDURE TRAY) ×1 IMPLANT
PACK EENT II TURBAN DRAPE (CUSTOM PROCEDURE TRAY) ×1 IMPLANT
PAD ARMBOARD 7.5X6 YLW CONV (MISCELLANEOUS) ×6 IMPLANT
PATTIES SURGICAL .5 X.5 (GAUZE/BANDAGES/DRESSINGS) ×1 IMPLANT
PENCIL BUTTON HOLSTER BLD 10FT (ELECTRODE) ×1 IMPLANT
SPONGE SURGIFOAM ABS GEL SZ50 (HEMOSTASIS) ×1 IMPLANT
STAPLER SKIN PROX WIDE 3.9 (STAPLE) ×2 IMPLANT
STAPLER VISISTAT 35W (STAPLE) ×1 IMPLANT
SUT BONE WAX W31G (SUTURE) ×1 IMPLANT
SUT ETHILON 3 0 FSL (SUTURE) ×1 IMPLANT
SUT NURALON 4 0 TR CR/8 (SUTURE) ×1 IMPLANT
SUT VIC AB 2-0 CP2 18 (SUTURE) ×2 IMPLANT
SUT VIC AB 2-0 CT1 18 (SUTURE) ×1 IMPLANT
SYR 5ML LUER SLIP (SYRINGE) ×2 IMPLANT
SYR BULB 3OZ (MISCELLANEOUS) ×1 IMPLANT
SYR CONTROL 10ML LL (SYRINGE) ×2 IMPLANT
TOWEL GREEN STERILE (TOWEL DISPOSABLE) ×2 IMPLANT
TOWEL GREEN STERILE FF (TOWEL DISPOSABLE) ×2 IMPLANT
TUBE CONNECTING 12X1/4 (SUCTIONS) ×1 IMPLANT
WATER STERILE IRR 1000ML POUR (IV SOLUTION) ×2 IMPLANT

## 2018-07-28 NOTE — Anesthesia Procedure Notes (Signed)
Procedure Name: Intubation Date/Time: 07/28/2018 8:09 AM Performed by: Scheryl Darter, CRNA Pre-anesthesia Checklist: Patient identified, Emergency Drugs available, Suction available, Patient being monitored and Timeout performed Patient Re-evaluated:Patient Re-evaluated prior to induction Oxygen Delivery Method: Circle system utilized Preoxygenation: Pre-oxygenation with 100% oxygen Induction Type: IV induction, Rapid sequence and Cricoid Pressure applied Laryngoscope Size: Mac and 3 Grade View: Grade I Tube type: Oral Tube size: 7.0 mm Number of attempts: 1 Airway Equipment and Method: Stylet Placement Confirmation: ETT inserted through vocal cords under direct vision,  positive ETCO2 and breath sounds checked- equal and bilateral Secured at: 23 cm Tube secured with: Tape Dental Injury: Teeth and Oropharynx as per pre-operative assessment

## 2018-07-28 NOTE — Anesthesia Postprocedure Evaluation (Signed)
Anesthesia Post Note  Patient: Amber Drake  Procedure(s) Performed: Stereotactic open biopsy of Right cerebellar hemisphere and dura with brainlab (N/A ) APPLICATION OF CRANIAL NAVIGATION (N/A )     Patient location during evaluation: PACU Anesthesia Type: General Level of consciousness: awake and alert Pain management: pain level controlled Vital Signs Assessment: post-procedure vital signs reviewed and stable Respiratory status: spontaneous breathing, nonlabored ventilation, respiratory function stable and patient connected to nasal cannula oxygen Cardiovascular status: blood pressure returned to baseline and stable Postop Assessment: no apparent nausea or vomiting Anesthetic complications: no    Last Vitals:  Vitals:   07/28/18 1122 07/28/18 1300  BP: 106/83 118/75  Pulse: 69 81  Resp: 18 17  Temp:    SpO2: 100% 98%    Last Pain:  Vitals:   07/28/18 1122  TempSrc:   PainSc: 0-No pain                 Minola Guin COKER

## 2018-07-28 NOTE — Anesthesia Post-op Follow-up Note (Incomplete)
  Anesthesia Pain Follow-up Note  Patient: Amber Drake  Day #: {NUMBER [id number]}  Date of Follow-up: 07/28/2018 Time: 1:29 PM  Last Vitals:  Vitals:   07/28/18 1107 07/28/18 1122  BP: (!) 112/59 106/83  Pulse: 88 69  Resp: 17 18  Temp:    SpO2: 93% 100%    Level of Consciousness: {Findings; LOC exam:17915::"alert"}  Pain: {Desc; pain severity:12299}   Side Effects:{CHL AN SIDE EFFECTS:22537}  Catheter Site Exam:{Exam; wound:13612}     Plan: {CHL AN PAIN ASSESSMENT / MQTT:27639}  Clearnce Sorrel

## 2018-07-28 NOTE — H&P (Signed)
Amber Drake is an 58 y.o. female.   Chief Complaint: Headaches HPI: 58 year old female with headaches and presented to the ER CT scan showed extensive thickening along the dura over the right cerebellar hemisphere with edema and reactive changes within the right cerebellar hemisphere.  Patient was worked up with chest abdomen pelvis CTs which were negative patient has no other known past medical history does have a family history of lupus and a family history of sarcoid but nothing has been diagnosed with her in the past.  Differential should include CNS lymphoma possibly sarcoid.  Patient presents for open stereotactic biopsy.  Past Medical History:  Diagnosis Date  . Anemia   . Arthritis   . Blood dyscrasia    sickle cell trait  . Carbuncle and furuncle   . Hypercholesterolemia   . Hypertension    does not take meds  . Sickle cell trait [facility])     Past Surgical History:  Procedure Laterality Date  . APPENDECTOMY    . CARPAL TUNNEL RELEASE  03/23/2011   Procedure: CARPAL TUNNEL RELEASE;  Surgeon: Sanjuana Kava;  Location: [facility];  Service: Orthopedics;  Laterality: Left;  . CARPAL TUNNEL RELEASE  05/10/2011   Procedure: CARPAL TUNNEL RELEASE;  Surgeon: Sanjuana Kava, MD;  Location: [facility];  Service: Orthopedics;  Laterality: Right;  . CESAREAN SECTION     x 2  . INCISION AND DRAINAGE PERIRECTAL ABSCESS  12/21/09  . PROCTOSCOPY  10/17/2010   Procedure: PROCTOSCOPY;  Surgeon: Scherry Ran;  Location: [facility];  Service: General;  Laterality: N/A;  Rigid Proctoscopy/Possible Fistula in Ano  Procedure ended at 1003  . THERAPEUTIC ABORTION     x2    Family History  Problem Relation Age of Onset  . Kidney failure Daughter   . Sickle cell anemia Daughter   . Sickle cell trait Daughter   . Sickle cell anemia Son   . Hypertension Mother   . Hyperlipidemia Mother   . Hypertension Father   . Lupus Father        skin  . Hypertension Other   . Sarcoidosis Sister   . Anesthesia  problems Neg Hx   . Hypotension Neg Hx   . Malignant hyperthermia Neg Hx   . Pseudochol deficiency Neg Hx    Social History:  reports that she has been smoking cigarettes. She has a 15.00 pack-year smoking history. She has never used smokeless tobacco. She reports that she does not drink alcohol or use drugs.  Allergies:  Allergies  Allergen Reactions  . Penicillins Anaphylaxis and Hives    Has patient had a PCN reaction causing immediate rash, facial/tongue/throat swelling, SOB or lightheadedness with hypotension: YES Has patient had a PCN reaction causing severe rash involving mucus membranes or skin necrosis: UNKNOWN  Has patient had a PCN reaction that required hospitalization: NO Has patient had a PCN reaction occurring within the last 10 years: NO If all of the above answers are "NO", then may proceed with Cephalosporin use.   Marland Kitchen Penicillins Cross Reactors Itching and Swelling    PENICILLIN > ANAPHYLAXIS  . Tape Itching    Medications Prior to Admission  Medication Sig Dispense Refill  . atorvastatin (LIPITOR) 20 MG tablet Take 1 tablet (20 mg total) by mouth daily. 90 tablet 2  . cyclobenzaprine (FLEXERIL) 10 MG tablet Take 1 tablet (10 mg total) by mouth 3 (three) times daily as needed for muscle spasms. (Patient taking differently: Take 10 mg by mouth 2 (two) times  daily as needed for muscle spasms. ) 40 tablet 0  . ferrous sulfate 325 (65 FE) MG tablet Take 325 mg by mouth daily with breakfast.    . HYDROcodone-acetaminophen (NORCO) 7.5-325 MG tablet Take 1 tablet by mouth every 6 (six) hours as needed for moderate pain (Must last 30 days). 45 tablet 0  . lisinopril-hydrochlorothiazide (PRINZIDE,ZESTORETIC) 20-12.5 MG tablet Take 1 tablet by mouth daily.    . Multiple Vitamin (MULTIVITAMIN WITH MINERALS) TABS tablet Take 1 tablet by mouth daily.    . ranitidine (ZANTAC) 150 MG capsule Take 150 mg by mouth 2 (two) times daily.      No results found for this or any previous  visit (from the past 48 hour(s)). No results found.  Review of Systems  Neurological: Positive for headaches.    Blood pressure 128/87, pulse 70, temperature 98.3 F (36.8 C), temperature source Oral, resp. rate 18, height 5\' 7"  (1.702 m), weight 76.2 kg, last menstrual period 08/12/2010, SpO2 100 %. Physical Exam  Constitutional: She is oriented to person, place, and time. She appears well-developed.  HENT:  Head: Normocephalic.  Eyes: Pupils are equal, round, and reactive to light.  Neck: Normal range of motion.  Respiratory: Effort normal and breath sounds normal.  GI: Soft. Bowel sounds are normal.  Musculoskeletal: Normal range of motion.  Neurological: She is alert and oriented to person, place, and time. She has normal strength. GCS eye subscore is 4. GCS verbal subscore is 5. GCS motor subscore is 6.  Patient is awake and alert pupils are equal extra movements are intact strength is 5 out of 5 upper and lower extremities  Skin: Skin is warm and dry.     Assessment/Plan 58 year old female presents for open stereotactic biopsy of the dura and right cerebellar hemisphere I have extensively gone over the risks and benefits of the operation with her as well as perioperative course expectations of outcome and alternatives of surgery and she understands and agrees to proceed forward.  Harnoor Reta P, MD 07/28/2018, 7:29 AM

## 2018-07-28 NOTE — Transfer of Care (Signed)
Immediate Anesthesia Transfer of Care Note  Patient: Amber Drake  Procedure(s) Performed: Stereotactic open biopsy of Right cerebellar hemisphere and dura with brainlab (N/A ) APPLICATION OF CRANIAL NAVIGATION (N/A )  Patient Location: PACU  Anesthesia Type:General  Level of Consciousness: drowsy  Airway & Oxygen Therapy: Patient Spontanous Breathing and Patient connected to face mask oxygen  Post-op Assessment: Report given to RN and Post -op Vital signs reviewed and unstable, Anesthesiologist notified - Pt began to obstruct upon arrival to PACU, O2 sats dropped to 40s and returned to 100% with jaw thrust. Dr Linna Caprice notified and arrived at bedside. All other VSS, pt more arousable and responsive prior to CRNA and MD leaving bedside.   Post vital signs: Reviewed and stable  Last Vitals:  Vitals Value Taken Time  BP 107/73 07/28/2018 10:24 AM  Temp    Pulse 95 07/28/2018 10:30 AM  Resp 20 07/28/2018 10:30 AM  SpO2 100 % 07/28/2018 10:30 AM  Vitals shown include unvalidated device data.  Last Pain:  Vitals:   07/28/18 0550  TempSrc:   PainSc: 0-No pain      Patients Stated Pain Goal: 4 (16/94/50 3888)  Complications: No apparent anesthesia complications

## 2018-07-28 NOTE — Op Note (Signed)
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  Surgeon: Dominica Severin Uri Turnbough  Assistant: Nash Shearer  Anesthesia: General  EBL: Minimal  HPI: 58 year old female presented for headaches and confusion work-up revealed thickening of the dura on the right posterior fossa with extensive high hyperintensity on T2 and flair in the right cerebellar hemisphere consistent with infiltrative tumor versus edema.  We worked her up with extensive CT scans of her chest abdomen pelvis with no overt abnormality or adenopathy.  Patient has no significant known past medical history.  Does have family history of sarcoid and lupus.  But due to her imaging findings clinical syndrome and unclarity of diagnosis I recommended open excisional biopsy of the dura and cerebellar hemisphere on the right.  I extensively reviewed the risks and benefits of the procedure with her as well as perioperative course expectations of outcome and alternatives to surgery and she understood and agreed to proceed forward.  Operative procedure: Patient brought into the ER was induced under general anesthesia positioned prone in pins the backside of the right side of her head was shaved prepped and draped in routine sterile fashion after preoperative registration with the BrainLab stereotactic navigation system identified the entry point and infiltrated and drew out a linear incision and after infiltration of 10 cc lidocaine with epi and incised linear incision paramedian on the right dissected down to the periosteum of the right posterior fossa cerebellar hemisphere.  Again utilizing the BrainLab stereotactic navigation system I selected my entry point identified the transverse sinus.  Utilizing a high-speed drill  performed a craniectomy.  Opened up the dura just inferior to this and in a circular fashion resected a large dural sample.  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  I then took a couple specimens of the cerebellar hemisphere.  Meticulous hemostasis was then maintained wound was copiously irrigated a piece of Surgicel as well as DuraGen was overlaid on the craniotomy defect the wound was closed in layers with Vicryl in a running nylon interlocking stitch.  Wound was dressed patient recovery in stable condition.  At the end the case on the account sponge counts were correct.

## 2018-07-28 NOTE — Anesthesia Procedure Notes (Signed)
Arterial Line Insertion Start/End4/27/2020 7:21 AM Performed by: Lavell Luster, CRNA, CRNA  Preanesthetic checklist: patient identified, IV checked, site marked, risks and benefits discussed, surgical consent, monitors and equipment checked, pre-op evaluation, timeout performed and anesthesia consent Lidocaine 1% used for infiltration Left, radial was placed Catheter size: 20 G Hand hygiene performed  and maximum sterile barriers used   Attempts: 1 Procedure performed without using ultrasound guided technique. Following insertion, dressing applied and Biopatch. Post procedure assessment: normal  Patient tolerated the procedure well with no immediate complications.

## 2018-07-28 NOTE — Anesthesia Preprocedure Evaluation (Signed)
Anesthesia Evaluation  Patient identified by MRN, date of birth, ID band Patient awake    Reviewed: Allergy & Precautions, NPO status , Patient's Chart, lab work & pertinent test results  Airway Mallampati: II  TM Distance: >3 FB Neck ROM: Full    Dental  (+) Edentulous Upper, Dental Advisory Given   Pulmonary Current Smoker,    breath sounds clear to auscultation       Cardiovascular hypertension,  Rhythm:Regular Rate:Normal     Neuro/Psych    GI/Hepatic   Endo/Other    Renal/GU      Musculoskeletal   Abdominal   Peds  Hematology   Anesthesia Other Findings   Reproductive/Obstetrics                             Anesthesia Physical Anesthesia Plan  ASA: III  Anesthesia Plan: General   Post-op Pain Management:    Induction: Intravenous  PONV Risk Score and Plan: Ondansetron and Dexamethasone  Airway Management Planned: Oral ETT  Additional Equipment: Arterial line  Intra-op Plan:   Post-operative Plan: Extubation in OR  Informed Consent: I have reviewed the patients History and Physical, chart, labs and discussed the procedure including the risks, benefits and alternatives for the proposed anesthesia with the patient or authorized representative who has indicated his/her understanding and acceptance.     Dental advisory given  Plan Discussed with: CRNA and Anesthesiologist  Anesthesia Plan Comments:         Anesthesia Quick Evaluation

## 2018-07-29 ENCOUNTER — Inpatient Hospital Stay (HOSPITAL_COMMUNITY): Payer: BLUE CROSS/BLUE SHIELD

## 2018-07-29 ENCOUNTER — Encounter (HOSPITAL_COMMUNITY): Payer: Self-pay | Admitting: Neurosurgery

## 2018-07-29 LAB — BASIC METABOLIC PANEL
Anion gap: 8 (ref 5–15)
BUN: 13 mg/dL (ref 6–20)
CO2: 23 mmol/L (ref 22–32)
Calcium: 9.2 mg/dL (ref 8.9–10.3)
Chloride: 108 mmol/L (ref 98–111)
Creatinine, Ser: 0.82 mg/dL (ref 0.44–1.00)
GFR calc Af Amer: >60 mL/min (ref >60)
GFR calc non Af Amer: >60 mL/min (ref >60)
Glucose, Bld: 142 mg/dL — ABNORMAL HIGH (ref 70–99)
Potassium: 3.7 mmol/L (ref 3.5–5.1)
Sodium: 139 mmol/L (ref 135–145)

## 2018-07-29 IMAGING — CT CT HEAD WITHOUT CONTRAST
4 series · 16 of 47 positions shown, 18 images · non-contrast
Comparison: [DATE] and [DATE] as well as MRI brain
[DATE]

CLINICAL DATA: Follow-up.  Postop craniotomy.

EXAM:
CT HEAD WITHOUT CONTRAST
TECHNIQUE: Contiguous axial images were obtained from the base of the skull
through the vertex without intravenous contrast.

[Series 3: head wo · axial · 0.42mm/px · z∈[-110,+10]mm · 7 of 33 slices shown, 9 images]
[im 5/33  brain]
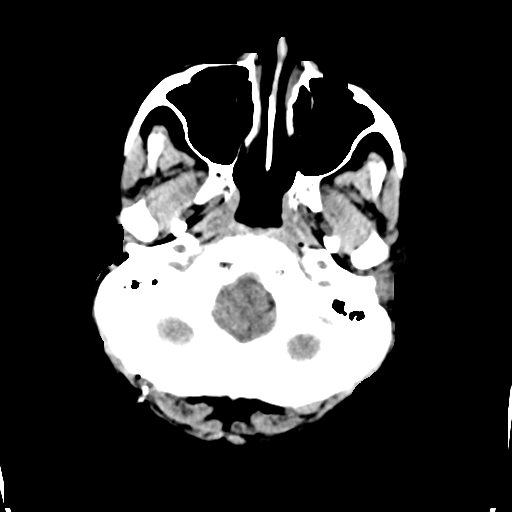
[im 5/33  bone]
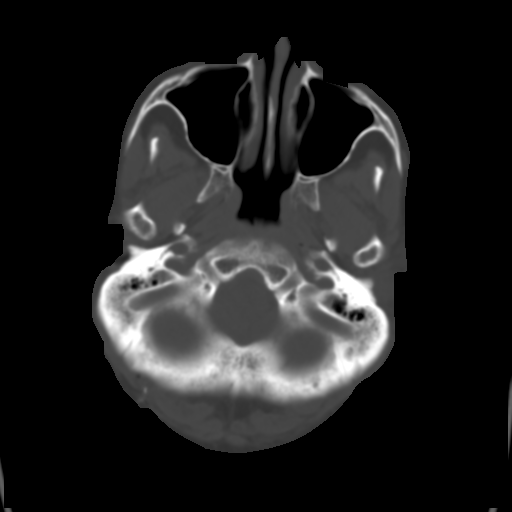
[im 9/33  brain]
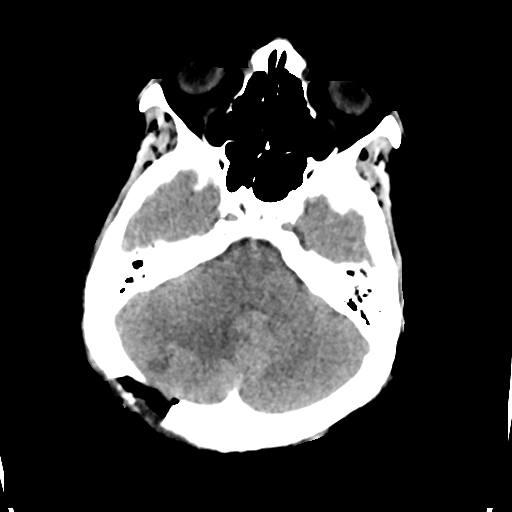
[im 13/33  brain]
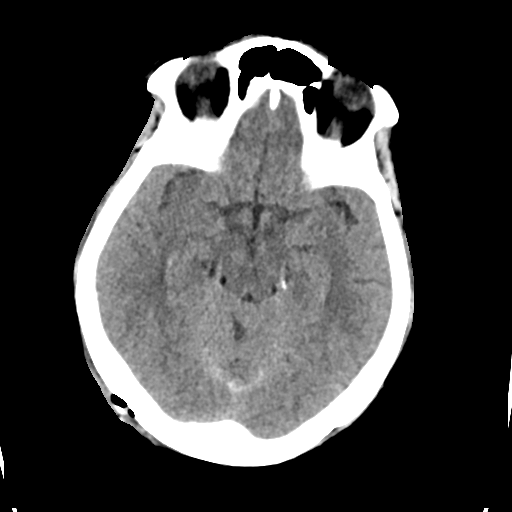
[im 17/33  brain]
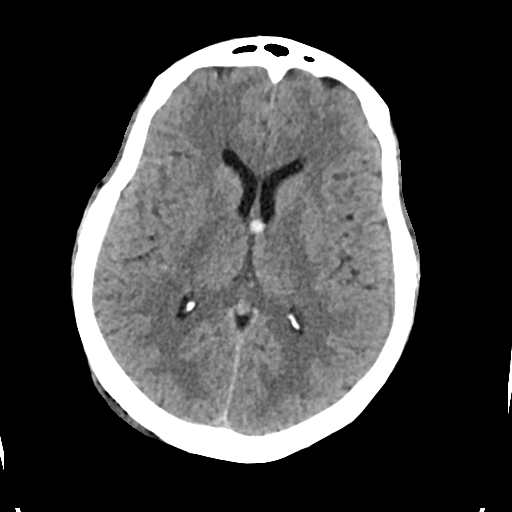
[im 21/33  brain]
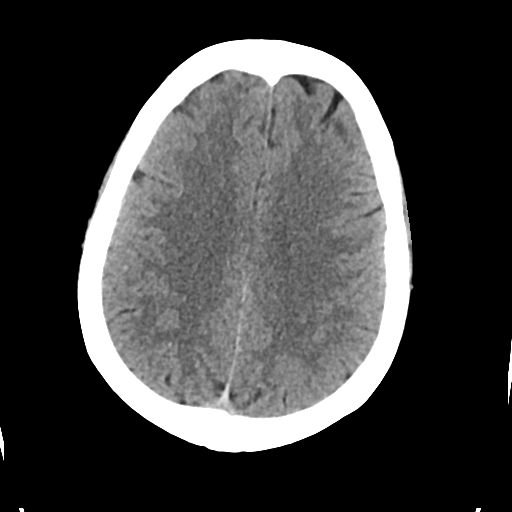
[im 21/33  bone]
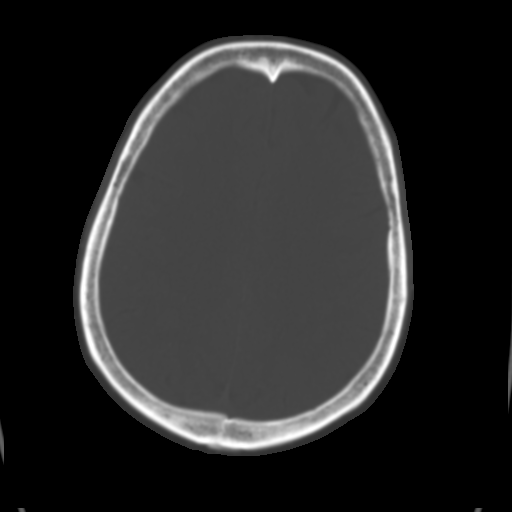
[im 25/33  brain]
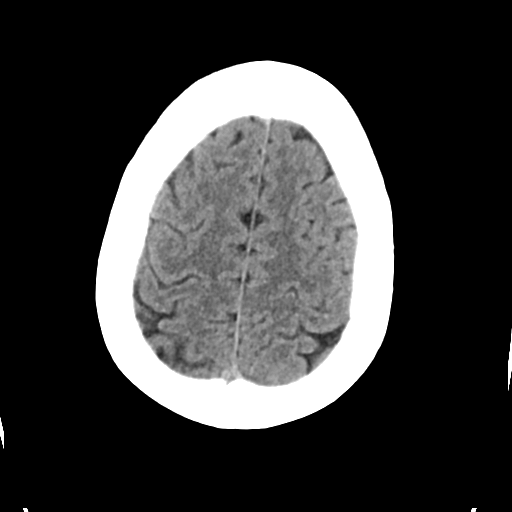
[im 29/33  brain]
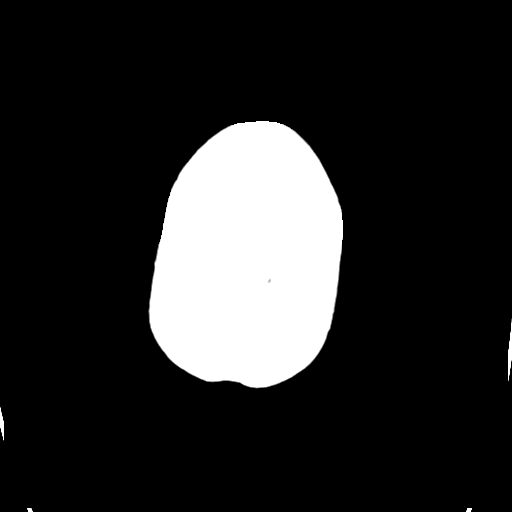

[Series 4: head bone · axial · 0.42mm/px · z∈[-114,-82]mm · 3 of 82 slices shown]
[im 9/82  bone]
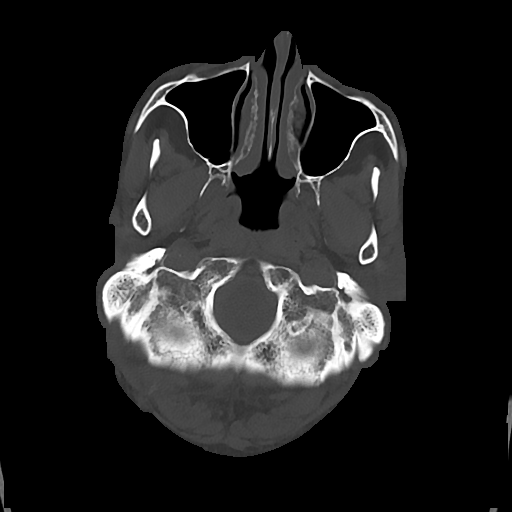
[im 17/82  bone]
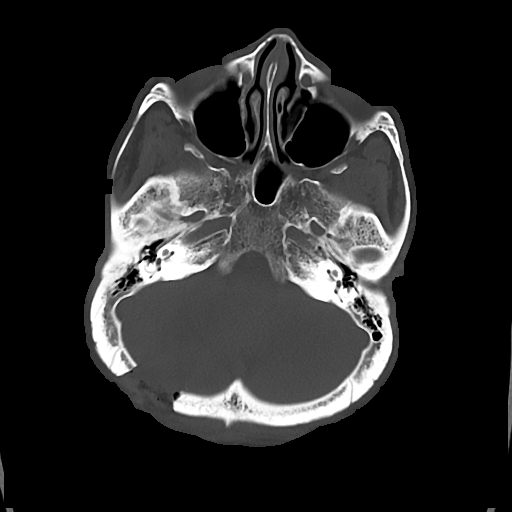
[im 25/82  bone]
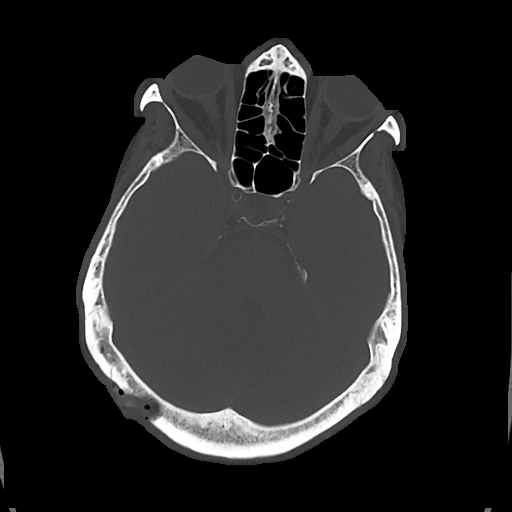

[Series 5: cor soft · coronal · 0.32mm/px · 3 of 67 slices shown]
[im 23/67  brain]
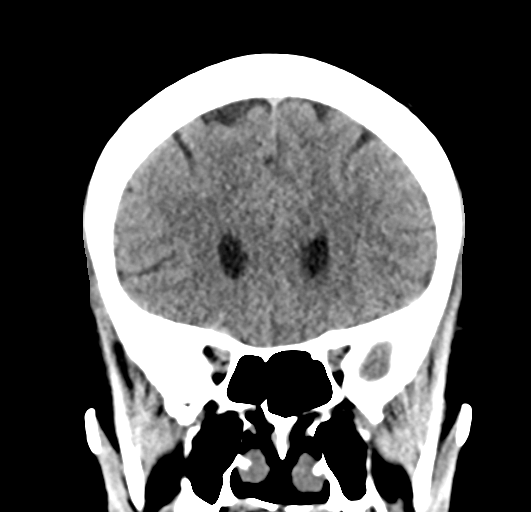
[im 30/67  brain]
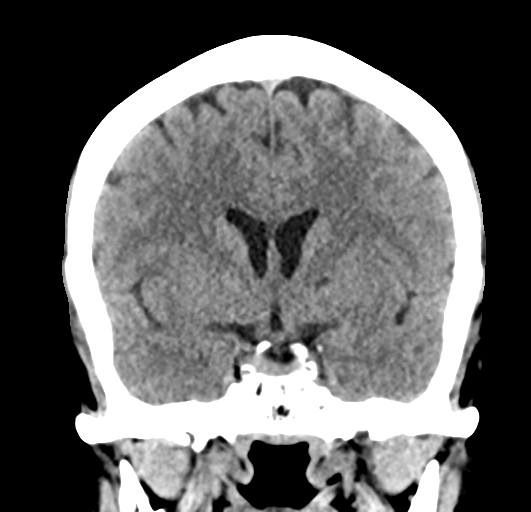
[im 37/67  brain]
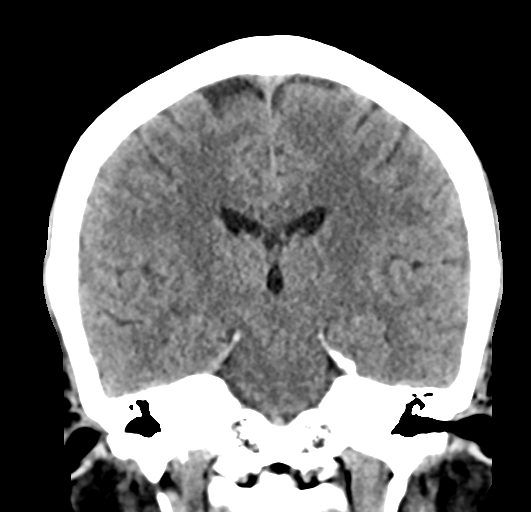

[Series 6: sag soft · sagittal · 0.32mm/px · 3 of 57 slices shown]
[im 19/57  brain]
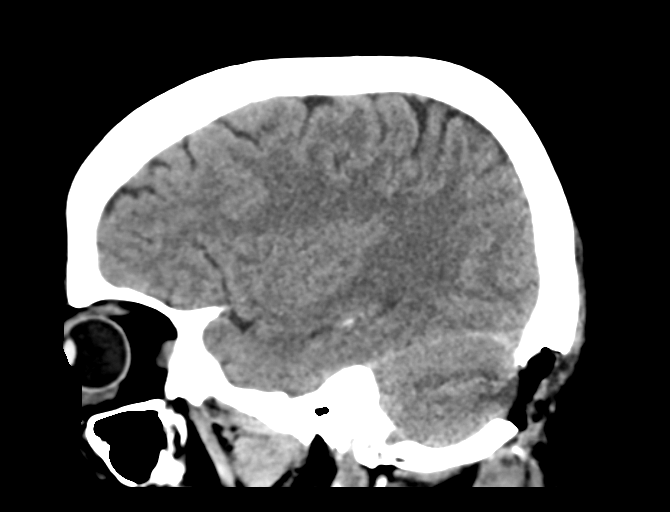
[im 29/57  brain]
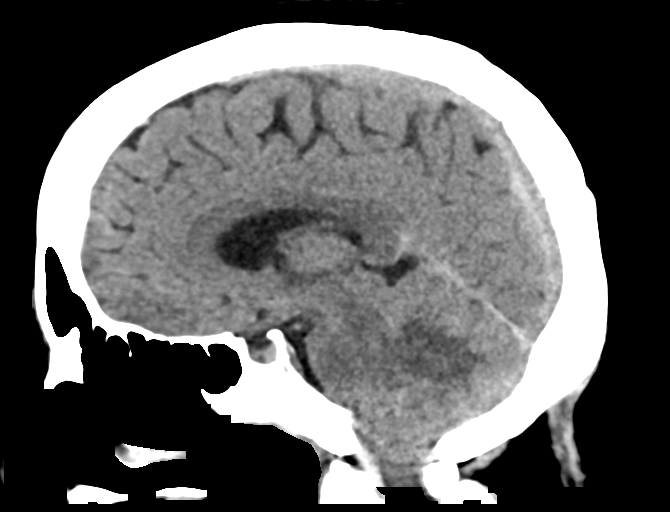
[im 38/57  brain]
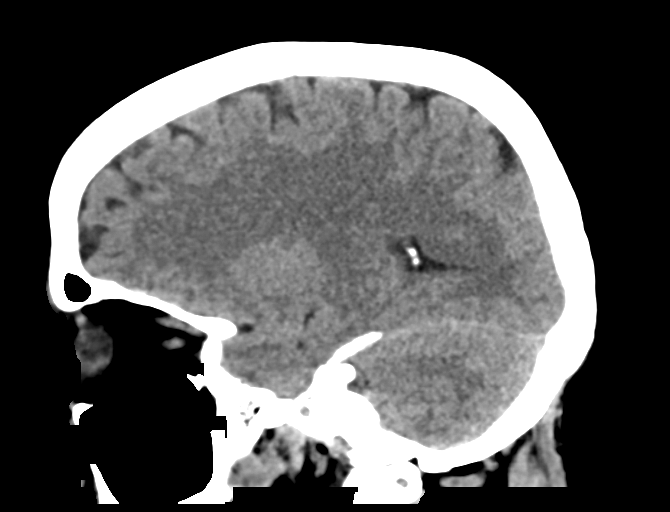

[16 of 47 positions shown; findings below may reference images not displayed]

FINDINGS: Brain: Interval right occipital craniectomy defect. Minimal
encephalomalacia deep to the craniectomy defect. Persistent minimal
mass effect on the fourth ventricle. Minimal midline shift to the
left of the cerebellum unchanged. The lateral and third ventricles
are normal. Stable 6 mm colloid cyst in the midline superior third
ventricle. Remainder of the supratentorial brain is unchanged.

Vascular: No hyperdense vessel or unexpected calcification.

Skull: Interval right occipital craniectomy defect as described.

Sinuses/Orbits: Orbits and sinuses are within normal.

Other: None
IMPRESSION: Interval right occipital craniectomy defect with mild underlying
encephalomalacia. Mild persistent mass effect on the fourth
ventricle with subtle midline shift of the cerebellum unchanged.

Stable 6 mm colloid cyst.

## 2018-07-29 NOTE — Discharge Summary (Signed)
Physician Discharge Summary  Patient ID: Amber Drake MRN: [id number] DOB/AGE: [id number] 58 y.o.  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 . The patient tolerated the procedure well and was taken to the recovery room and then to theICU in stable condition. The hospital course was routine. There were no complications. The wound remained clean dry and intact. Pt had appropriate head soreness. No complaints of new pain or new N/T/W. The patient remained afebrile with stable vital signs, and tolerated a regular diet. The patient continued to increase activities, and pain was well controlled with oral pain medications.   Consults: None  Significant Diagnostic Studies:  Results for orders placed or performed during the hospital encounter of 07/28/18  MRSA PCR Screening  Result Value Ref Range   MRSA by PCR NEGATIVE NEGATIVE  Basic metabolic panel  Result Value Ref Range   Sodium 140 135 - 145 mmol/L   Potassium 3.5 3.5 - 5.1 mmol/L   Chloride 103 98 - 111 mmol/L   CO2 25 22 - 32 mmol/L   Glucose, Bld 152 (H) 70 - 99 mg/dL   BUN 11 6 - 20 mg/dL   Creatinine, Ser 0.89 0.44 - 1.00 mg/dL   Calcium 9.7 8.9 - 10.3 mg/dL   GFR calc non Af Amer >60 >60 mL/min   GFR calc Af Amer >60 >60 mL/min   Anion gap 12 5 - 15  CBC  Result Value Ref Range   WBC 14.2 (H) 4.0 - 10.5 K/uL   RBC 3.82 (L) 3.87 - 5.11 MIL/uL   Hemoglobin 11.1 (L) 12.0 - 15.0 g/dL   HCT 32.3 (L) 36.0 - 46.0 %   MCV 84.6 80.0 - 100.0 fL   MCH 29.1 26.0 - 34.0 pg   MCHC 34.4 30.0 - 36.0 g/dL   RDW 13.0  11.5 - 15.5 %   Platelets 374 150 - 400 K/uL   nRBC 0.0 0.0 - 0.2 %  Basic metabolic panel  Result Value Ref Range   Sodium 139 135 - 145 mmol/L   Potassium 3.7 3.5 - 5.1 mmol/L   Chloride 108 98 - 111 mmol/L   CO2 23 22 - 32 mmol/L   Glucose, Bld 142 (H) 70 - 99 mg/dL   BUN 13 6 - 20 mg/dL   Creatinine, Ser 0.82 0.44 - 1.00 mg/dL   Calcium 9.2 8.9 - 10.3 mg/dL   GFR calc non Af Amer >60 >60 mL/min   GFR calc Af Amer >60 >60 mL/min   Anion gap 8 5 - 15  Type and screen  Result Value Ref Range   ABO/RH(D) O POS    Antibody Screen NEG    Sample Expiration      07/31/2018 Performed at [facility], [address] [place], [place] [address]   ABO/Rh  Result Value Ref Range   ABO/RH(D)      O POS Performed at [facility] [address] [place], [place] [address]     Ct Head Wo Contrast  Result Date: 07/29/2018 CLINICAL DATA:  Follow-up.  Postop craniotomy. EXAM: CT HEAD WITHOUT CONTRAST TECHNIQUE: Contiguous axial images  were obtained from the base of the skull through the vertex without intravenous contrast. COMPARISON:  07/28/2018 and 07/21/2018 as well as MRI brain 07/21/2018 FINDINGS: Brain: Interval right occipital craniectomy defect. Minimal encephalomalacia deep to the craniectomy defect. Persistent minimal mass effect on the fourth ventricle. Minimal midline shift to the left of the cerebellum unchanged. The lateral and third ventricles are normal. Stable 6 mm colloid cyst in the midline superior third ventricle. Remainder of the supratentorial brain is unchanged. Vascular: No hyperdense vessel or unexpected calcification. Skull: Interval right occipital craniectomy defect as described. Sinuses/Orbits: Orbits and sinuses are within normal. Other: None IMPRESSION: Interval right occipital craniectomy defect with mild underlying encephalomalacia. Mild persistent mass effect on the fourth ventricle with subtle midline shift of the cerebellum unchanged. Stable 6 mm  colloid cyst. Electronically Signed   By: Marin Olp M.D.   On: 07/29/2018 12:06   Ct Head Wo Contrast  Result Date: 07/28/2018 CLINICAL DATA:  Brain neoplasm. Brain lab study for preoperative planning. EXAM: CT HEAD WITHOUT CONTRAST TECHNIQUE: Contiguous axial images were obtained from the base of the skull through the vertex without intravenous contrast. COMPARISON:  MRI brain 07/21/2018 FINDINGS: Brain: Asymmetric hypoattenuation involving the right cerebellar hemisphere is again noted. Mass effect on the fourth ventricle and extension of cerebellar tonsils into the foramen magnum is again noted. Dural thickening is less well appreciated by CT. 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 Other acute intracranial abnormality is evident. No other significant extra-axial fluid collection is present. Ventricles are of normal size. Vascular: Atherosclerotic calcifications are present within the cavernous internal carotid arteries bilaterally without a hyperdense vessel. Skull: Calvarium is intact. No focal lytic or blastic lesions are present. Sinuses/Orbits: The paranasal sinuses and mastoid air cells are clear. The globes and orbits are within normal limits. IMPRESSION: 1. Similar appearance of asymmetric hypoattenuation involving the right cerebellum with mass effect on the posterior fossa. 2. Dural thickening about the right cerebellum is not well appreciated by CT. 3. Colloid cyst. Electronically Signed   By: San Morelle M.D.   On: 07/28/2018 07:34   Ct Head Wo Contrast  Result Date: 07/21/2018 CLINICAL DATA:  Headache 1 month EXAM: CT HEAD WITHOUT CONTRAST TECHNIQUE: Contiguous axial images were obtained from the base of the skull through the vertex without intravenous contrast. COMPARISON:  None. 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 Mass-effect in the posterior fossa with effacement of the prepontine cistern and cerebellar folia.  Asymmetric hypodensity in the right medial cerebellum with mass-effect on the fourth ventricle. Cerebellar tonsils extend below the foramen magnum. Findings concerning for cerebellar mass. No associated hemorrhage. Ventricles may be slightly prominent possibly due to obstructive hydrocephalus. Rounded frontal horns. Vascular: Normal arterial flow voids Skull: Negative Sinuses/Orbits: Negative Other: None IMPRESSION: Hypodensity right medial cerebellum with mass-effect on the fourth ventricle and possible early hydrocephalus. Downward herniation of cerebellar tonsils. Findings concerning for cerebellar mass. Recommend MRI brain without and with contrast 6 mm colloid cyst in the foramen Monro likely an incidental finding. Electronically Signed   By: Franchot Gallo M.D.   On: 07/21/2018 16:19   Ct Chest W Contrast  Result Date: 07/25/2018 CLINICAL DATA:  Cerebral edema. EXAM: CT CHEST, ABDOMEN, AND PELVIS WITH CONTRAST TECHNIQUE: Multidetector CT imaging of the chest, abdomen and pelvis was performed following the standard protocol during bolus administration of intravenous contrast. CONTRAST:  178mL OMNIPAQUE IOHEXOL 300 MG/ML  SOLN COMPARISON:  MRI of  July 21, 2018.  CT scan of December 10, 2017. FINDINGS: CT CHEST FINDINGS Cardiovascular: No significant vascular findings. Normal heart size. No pericardial effusion. Mediastinum/Nodes: No enlarged mediastinal, hilar, or axillary lymph nodes. Thyroid gland, trachea, and esophagus demonstrate no significant findings. Lungs/Pleura: Lungs are clear. No pleural effusion or pneumothorax. Musculoskeletal: No chest wall mass or suspicious bone lesions identified. CT ABDOMEN PELVIS FINDINGS Hepatobiliary: No focal liver abnormality is seen. No gallstones, gallbladder wall thickening, or biliary dilatation. Pancreas: Unremarkable. No pancreatic ductal dilatation or surrounding inflammatory changes. Spleen: Normal in size without focal abnormality. Adrenals/Urinary Tract:  Adrenal glands are unremarkable. Kidneys are normal, without renal calculi, focal lesion, or hydronephrosis. Bladder is unremarkable. Stomach/Bowel: The stomach appears normal. There is no evidence of bowel obstruction or inflammation. Status post appendectomy. Vascular/Lymphatic: Aortic atherosclerosis. No enlarged abdominal or pelvic lymph nodes. Reproductive: Uterus and bilateral adnexa are unremarkable. Other: No abdominal wall hernia or abnormality. No abdominopelvic ascites. Musculoskeletal: No acute or significant osseous findings. IMPRESSION: No significant abnormality seen in the chest, abdomen or pelvis. Aortic Atherosclerosis (ICD10-I70.0). Electronically Signed   By: Marijo Conception M.D.   On: 07/25/2018 14:39   Mr Jeri Cos And Wo Contrast  Result Date: 07/21/2018 CLINICAL DATA:  Headache.  Abnormal CT today EXAM: MRI HEAD WITHOUT AND WITH CONTRAST TECHNIQUE: Multiplanar, multiecho pulse sequences of the brain and surrounding structures were obtained without and with intravenous contrast. CONTRAST:  7 mL Gadovist IV COMPARISON:  CT head today FINDINGS: Brain: Image quality degraded by motion. Prominent dural thickening in the posterior fossa on the right surrounding the right cerebellar hemisphere and extending into the folia. Dural thickening is somewhat lobular and measures up to 11 mm in thickness. There is associated edema in the right cerebellum with mass-effect on the fourth ventricle as noted on CT. Fourth ventricle effaced with mild obstructive hydrocephalus. Edema extends into the cerebellar vermis and a small amount into the left cerebellum. Dural thickening extends into the spinal canal surrounding the spinal canal at the craniocervical junction. No significant dural thickening in the posterior fossa on the left or in the cerebral hemispheres. Ventricle size mildly enlarged. 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 No acute infarct. No significant white  matter disease in the cerebral hemispheres. Vascular: Normal arterial flow voids. Negative for venous sinus thrombosis. Skull and upper cervical spine: Negative Sinuses/Orbits: Negative Other: None IMPRESSION: Extensive dural thickening surrounding the right cerebellar hemisphere with mass-effect and edema in the right cerebellum. The process appears to extend into the upper cervical canal. Mild obstructive hydrocephalus. Differential includes tumor including metastatic disease and lymphoma. Chronic inflammatory process such as sarcoid is a consideration however chest x-ray negative. TB and atypical infection/fungus also possible. 6 mm colloid cyst felt to be a separate problem. These results were called by telephone at the time of interpretation on 07/21/2018 at 6:01 pm to Dr. Nanda Quinton , who verbally acknowledged these results. Electronically Signed   By: Franchot Gallo M.D.   On: 07/21/2018 18:03   Ct Abdomen Pelvis W Contrast  Result Date: 07/25/2018 CLINICAL DATA:  Cerebral edema. EXAM: CT CHEST, ABDOMEN, AND PELVIS WITH CONTRAST TECHNIQUE: Multidetector CT imaging of the chest, abdomen and pelvis was performed following the standard protocol during bolus administration of intravenous contrast. CONTRAST:  119mL OMNIPAQUE IOHEXOL 300 MG/ML  SOLN COMPARISON:  MRI of July 21, 2018.  CT scan of December 10, 2017. FINDINGS: CT CHEST FINDINGS Cardiovascular: No significant vascular  findings. Normal heart size. No pericardial effusion. Mediastinum/Nodes: No enlarged mediastinal, hilar, or axillary lymph nodes. Thyroid gland, trachea, and esophagus demonstrate no significant findings. Lungs/Pleura: Lungs are clear. No pleural effusion or pneumothorax. Musculoskeletal: No chest wall mass or suspicious bone lesions identified. CT ABDOMEN PELVIS FINDINGS Hepatobiliary: No focal liver abnormality is seen. No gallstones, gallbladder wall thickening, or biliary dilatation. Pancreas: Unremarkable. No pancreatic ductal  dilatation or surrounding inflammatory changes. Spleen: Normal in size without focal abnormality. Adrenals/Urinary Tract: Adrenal glands are unremarkable. Kidneys are normal, without renal calculi, focal lesion, or hydronephrosis. Bladder is unremarkable. Stomach/Bowel: The stomach appears normal. There is no evidence of bowel obstruction or inflammation. Status post appendectomy. Vascular/Lymphatic: Aortic atherosclerosis. No enlarged abdominal or pelvic lymph nodes. Reproductive: Uterus and bilateral adnexa are unremarkable. Other: No abdominal wall hernia or abnormality. No abdominopelvic ascites. Musculoskeletal: No acute or significant osseous findings. IMPRESSION: No significant abnormality seen in the chest, abdomen or pelvis. Aortic Atherosclerosis (ICD10-I70.0). Electronically Signed   By: Marijo Conception M.D.   On: 07/25/2018 14:39    Antibiotics:  Anti-infectives (From admission, onward)   Start     Dose/Rate Route Frequency Ordered Stop   07/28/18 1800  vancomycin (VANCOCIN) IVPB 1000 mg/200 mL premix     1,000 mg 200 mL/hr over 60 Minutes Intravenous Every 12 hours 07/28/18 1351 07/29/18 0715   07/28/18 1300  ceFAZolin (ANCEF) IVPB 2g/100 mL premix  Status:  Discontinued     2 g 200 mL/hr over 30 Minutes Intravenous Every 8 hours 07/28/18 1255 07/28/18 1350   07/28/18 0600  vancomycin (VANCOCIN) IVPB 1000 mg/200 mL premix     1,000 mg 200 mL/hr over 60 Minutes Intravenous On call to O.R. 07/28/18 0531 07/28/18 7425      Discharge Exam: Blood pressure 99/70, pulse 89, temperature 97.9 F (36.6 C), temperature source Oral, resp. rate (!) 24, height 5\' 7"  (1.702 m), weight 76.2 kg, last menstrual period 08/12/2010, SpO2 100 %. Neurologic: Grossly normal Ambulating and voiding well, incisino cdi  Discharge Medications:   Allergies as of 07/29/2018      Reactions   Penicillins Anaphylaxis, Hives   Has patient had a PCN reaction causing immediate rash, facial/tongue/throat swelling,  SOB or lightheadedness with hypotension: YES Has patient had a PCN reaction causing severe rash involving mucus membranes or skin necrosis: UNKNOWN  Has patient had a PCN reaction that required hospitalization: NO Has patient had a PCN reaction occurring within the last 10 years: NO If all of the above answers are "NO", then may proceed with Cephalosporin use.   Penicillins Cross Reactors Itching, Swelling   PENICILLIN > ANAPHYLAXIS   Tape Itching      Medication List    STOP taking these medications   HYDROcodone-acetaminophen 7.5-325 MG tablet Commonly known as:  Norco     TAKE these medications   atorvastatin 20 MG tablet Commonly known as:  LIPITOR Take 1 tablet (20 mg total) by mouth daily.   cyclobenzaprine 10 MG tablet Commonly known as:  FLEXERIL Take 1 tablet (10 mg total) by mouth 3 (three) times daily as needed for muscle spasms. What changed:  when to take this   ferrous sulfate 325 (65 FE) MG tablet Take 325 mg by mouth daily with breakfast.   lisinopril-hydrochlorothiazide 20-12.5 MG tablet Commonly known as:  ZESTORETIC Take 1 tablet by mouth daily.   multivitamin with minerals Tabs tablet Take 1 tablet by mouth daily.   ranitidine 150 MG capsule Commonly known as:  ZANTAC Take 150 mg by mouth 2 (two) times daily.       Disposition: home   Final Dx: craniectomy for biopsy of right cerebellar dura and hemisphere  Discharge Instructions    Call MD for:  difficulty breathing, headache or visual disturbances   Complete by:  As directed    Call MD for:  extreme fatigue   Complete by:  As directed    Call MD for:  hives   Complete by:  As directed    Call MD for:  persistant dizziness or light-headedness   Complete by:  As directed    Call MD for:  persistant nausea and vomiting   Complete by:  As directed    Call MD for:  redness, tenderness, or signs of infection (pain, swelling, redness, odor or green/yellow discharge around incision site)    Complete by:  As directed    Call MD for:  severe uncontrolled pain   Complete by:  As directed    Call MD for:  temperature >100.4   Complete by:  As directed    Diet - low sodium heart healthy   Complete by:  As directed    Increase activity slowly   Complete by:  As directed    Remove dressing in 24 hours   Complete by:  As directed          Signed: Ocie Cornfield Jermond Burkemper 07/29/2018, 1:36 PM

## 2018-07-29 NOTE — Plan of Care (Signed)
  Problem: Health Behavior/Discharge Planning: Goal: Ability to manage health-related needs will improve 07/29/2018 0959 by Jenne Campus, RN Outcome: Progressing 07/29/2018 0924 by Jenne Campus, RN Outcome: Progressing   Problem: Activity: Goal: Risk for activity intolerance will decrease 07/29/2018 0959 by Jenne Campus, RN Outcome: Progressing 07/29/2018 0924 by Jenne Campus, RN Outcome: Progressing

## 2018-07-29 NOTE — Progress Notes (Signed)
Pt given discharge instructions with understanding. Pt has no questions at this time. Monitor and iv d/c. Pt called ride home.

## 2018-07-29 NOTE — Discharge Instructions (Signed)
Please take your steroid(sent to your pharmacy) 4mg  every 6 hours for 4 days starting 07/30/2018; then take your steroid 4mg  every 8 hours until you see the doctor.  Follow up with Dr. Mickeal Skinner (Neuro-oncology) in one week.

## 2018-07-29 NOTE — Progress Notes (Addendum)
Subjective: Patient reports doing very well and wants to go home.   Objective: Vital signs in last 24 hours: Temp:  [97.3 F (36.3 C)-98.1 F (36.7 C)] 98.1 F (36.7 C) (04/28 0800) Pulse Rate:  [64-96] 83 (04/28 0800) Resp:  [12-30] 30 (04/28 0800) BP: (81-139)/(44-96) 100/59 (04/28 0800) SpO2:  [85 %-100 %] 96 % (04/28 0800) Arterial Line BP: (99-157)/(43-73) 124/62 (04/28 0800)  Intake/Output from previous day: 04/27 0701 - 04/28 0700 In: 2695.2 [P.O.:360; I.V.:1935.2; IV Piggyback:400] Out: 765 [Urine:665; Blood:100] Intake/Output this shift: Total I/O In: 274.9 [I.V.:74.9; IV Piggyback:200] Out: -   Neurologic: Grossly normal  Lab Results: Lab Results  Component Value Date   WBC 14.2 (H) 07/28/2018   HGB 11.1 (L) 07/28/2018   HCT 32.3 (L) 07/28/2018   MCV 84.6 07/28/2018   PLT 374 07/28/2018   No results found for: INR, PROTIME BMET Lab Results  Component Value Date   NA 140 07/28/2018   K 3.5 07/28/2018   CL 103 07/28/2018   CO2 25 07/28/2018   GLUCOSE 152 (H) 07/28/2018   BUN 11 07/28/2018   CREATININE 0.89 07/28/2018   CALCIUM 9.7 07/28/2018    Studies/Results: Ct Head Wo Contrast  Result Date: 07/28/2018 CLINICAL DATA:  Brain neoplasm. Brain lab study for preoperative planning. EXAM: CT HEAD WITHOUT CONTRAST TECHNIQUE: Contiguous axial images were obtained from the base of the skull through the vertex without intravenous contrast. COMPARISON:  MRI brain 07/21/2018 FINDINGS: Brain: Asymmetric hypoattenuation involving the right cerebellar hemisphere is again noted. Mass effect on the fourth ventricle and extension of cerebellar tonsils into the foramen magnum is again noted. Dural thickening is less well appreciated by CT. 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 Other acute intracranial abnormality is evident. No other significant extra-axial fluid collection is present. Ventricles are of normal size. Vascular: Atherosclerotic calcifications are  present within the cavernous internal carotid arteries bilaterally without a hyperdense vessel. Skull: Calvarium is intact. No focal lytic or blastic lesions are present. Sinuses/Orbits: The paranasal sinuses and mastoid air cells are clear. The globes and orbits are within normal limits. IMPRESSION: 1. Similar appearance of asymmetric hypoattenuation involving the right cerebellum with mass effect on the posterior fossa. 2. Dural thickening about the right cerebellum is not well appreciated by CT. 3. Colloid cyst. Electronically Signed   By: San Morelle M.D.   On: 07/28/2018 07:34    Assessment/Plan: Postop day 1 brain biopsy. Ambulate out of bed today. Will get head CT before discharging home. If CT is stable will discharge late this afternoon. D/c aline.    LOS: 1 day    Amber Drake Amber Drake 07/29/2018, 8:51 AM

## 2018-08-11 ENCOUNTER — Inpatient Hospital Stay: Payer: BLUE CROSS/BLUE SHIELD | Attending: Internal Medicine | Admitting: Internal Medicine

## 2018-08-11 ENCOUNTER — Telehealth: Payer: Self-pay | Admitting: *Deleted

## 2018-08-11 ENCOUNTER — Telehealth: Payer: Self-pay | Admitting: Hematology

## 2018-08-11 ENCOUNTER — Telehealth: Payer: Self-pay | Admitting: Internal Medicine

## 2018-08-11 ENCOUNTER — Encounter: Payer: Self-pay | Admitting: Internal Medicine

## 2018-08-11 ENCOUNTER — Other Ambulatory Visit: Payer: Self-pay

## 2018-08-11 DIAGNOSIS — R220 Localized swelling, mass and lump, head: Secondary | ICD-10-CM | POA: Diagnosis not present

## 2018-08-11 DIAGNOSIS — F17210 Nicotine dependence, cigarettes, uncomplicated: Secondary | ICD-10-CM | POA: Diagnosis not present

## 2018-08-11 DIAGNOSIS — I10 Essential (primary) hypertension: Secondary | ICD-10-CM

## 2018-08-11 DIAGNOSIS — G911 Obstructive hydrocephalus: Secondary | ICD-10-CM | POA: Diagnosis not present

## 2018-08-11 DIAGNOSIS — R51 Headache: Secondary | ICD-10-CM | POA: Diagnosis not present

## 2018-08-11 DIAGNOSIS — D573 Sickle-cell trait: Secondary | ICD-10-CM

## 2018-08-11 DIAGNOSIS — M1990 Unspecified osteoarthritis, unspecified site: Secondary | ICD-10-CM

## 2018-08-11 DIAGNOSIS — E7800 Pure hypercholesterolemia, unspecified: Secondary | ICD-10-CM | POA: Diagnosis not present

## 2018-08-11 DIAGNOSIS — Z79899 Other long term (current) drug therapy: Secondary | ICD-10-CM

## 2018-08-11 DIAGNOSIS — C8331 Diffuse large B-cell lymphoma, lymph nodes of head, face, and neck: Secondary | ICD-10-CM | POA: Diagnosis not present

## 2018-08-11 DIAGNOSIS — G936 Cerebral edema: Secondary | ICD-10-CM

## 2018-08-11 DIAGNOSIS — C8330 Diffuse large B-cell lymphoma, unspecified site: Secondary | ICD-10-CM | POA: Insufficient documentation

## 2018-08-11 MED ORDER — DEXAMETHASONE 4 MG PO TABS: 4.0000 mg | ORAL_TABLET | Freq: Every day | ORAL | 0 refills | Status: DC

## 2018-08-11 NOTE — Telephone Encounter (Signed)
No los per 5/11.

## 2018-08-11 NOTE — Telephone Encounter (Signed)
A new patient appt has been scheduled for the pt to see Dr. Irene Limbo on 5/13 at 10am. Pt is aware of appt date and time. A msg has been sent to Dr. Irene Limbo and Carlyon Prows to explain the reason for the referral.

## 2018-08-11 NOTE — Telephone Encounter (Signed)
Called to verify appt time with Dr. Irene Limbo.

## 2018-08-11 NOTE — Progress Notes (Signed)
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    Referring Provider: [name], MD [address] [place], [place] [address]  Oncologic History: 07/28/18: Biopsy of enhancing meningeal mass by Dr. Saintclair Halsted demonstrates B-cell lymphoma   History of Present Illness: The patient's records from the referring physician were obtained and reviewed and the patient interviewed to confirm this HPI.  Amber Drake presented to medical attention in late April with new onset headaches.  She described holocranial pain, worse in the morning and worsened by coughing or straining.  CNS imaging demonstrated enhancing mass along the dura within posterior fossa and extending down into the meninges surrounding the lower brainstem.  She underwent biopsy with Dr. Saintclair Halsted, which now demonstrates B-cell lymphoma.  In the meantime, her headaches have resolved since beginning dexamethasone post-op.  She has no complaints at this time, feels otherwise "normal".       Medications: Current Outpatient Medications on File Prior to Visit  Medication Sig Dispense Refill   atorvastatin (LIPITOR) 20 MG tablet Take 1 tablet (20 mg total) by mouth daily. 90 tablet 2   cyclobenzaprine (FLEXERIL) 10 MG tablet Take 1 tablet (10 mg total) by mouth 3 (three) times daily as needed for muscle spasms. (Patient taking differently: Take 10 mg by mouth 2 (two) times daily as needed for muscle spasms. ) 40 tablet 0   ferrous sulfate 325 (65 FE) MG tablet Take 325 mg by mouth daily with breakfast.     lisinopril-hydrochlorothiazide (PRINZIDE,ZESTORETIC) 20-12.5 MG tablet Take  1 tablet by mouth daily.     Multiple Vitamin (MULTIVITAMIN WITH MINERALS) TABS tablet Take 1 tablet by mouth daily.     ranitidine (ZANTAC) 150 MG capsule Take 150 mg by mouth 2 (two) times daily.     No current facility-administered medications on file prior to visit.     Allergies:  Allergies  Allergen Reactions   Penicillins Anaphylaxis and Hives    Has patient had a PCN reaction causing immediate rash, facial/tongue/throat swelling, SOB or lightheadedness with hypotension: YES Has patient had a PCN reaction causing severe rash involving mucus membranes or skin necrosis: UNKNOWN  Has patient had a PCN reaction that required hospitalization: NO Has patient had a PCN reaction occurring within the last 10 years: NO If all of the above answers are "NO", then may proceed with Cephalosporin use.    Penicillins Cross Reactors Itching and Swelling    PENICILLIN > ANAPHYLAXIS   Tape Itching   Past Medical History:  Past Medical History:  Diagnosis Date   Anemia    Arthritis    Blood dyscrasia    sickle cell trait   Carbuncle and furuncle    Hypercholesterolemia    Hypertension    does not take meds   Sickle cell trait ([place])    Past Surgical History:  Past Surgical History:  Procedure Laterality Date   APPENDECTOMY     APPLICATION OF CRANIAL NAVIGATION N/A 07/28/2018   Procedure: APPLICATION OF CRANIAL NAVIGATION;  Surgeon: Kary Kos, MD;  Location: [facility];  Service: Neurosurgery;  Laterality: N/A;   CARPAL TUNNEL RELEASE  03/23/2011   Procedure: CARPAL TUNNEL RELEASE;  Surgeon: Patrick Jupiter  [place];  Location: [facility];  Service: Orthopedics;  Laterality: Left;   CARPAL TUNNEL RELEASE  05/10/2011   Procedure: CARPAL TUNNEL RELEASE;  Surgeon: Sanjuana Kava, MD;  Location: [facility];  Service: Orthopedics;  Laterality: Right;   CESAREAN SECTION     x 2   INCISION AND DRAINAGE PERIRECTAL ABSCESS  12/21/09   PR DURAL GRAFT REPAIR,SPINE DEFECT N/A 07/28/2018   Procedure:  Stereotactic open biopsy of Right cerebellar hemisphere and dura with brainlab;  Surgeon: Kary Kos, MD;  Location: [facility];  Service: Neurosurgery;  Laterality: N/A;  Stereotactic open biopsy of Right cerebellar hemisphere and dura with brainlab   PROCTOSCOPY  10/17/2010   Procedure: PROCTOSCOPY;  Surgeon: Scherry Ran;  Location: [facility];  Service: General;  Laterality: N/A;  Rigid Proctoscopy/Possible Fistula in Ano  Procedure ended at 1003   THERAPEUTIC ABORTION     x2   Social History:  Social History   Socioeconomic History   Marital status: Legally Separated    Spouse name: Not on file   Number of children: Not on file   Years of education: Not on file   Highest education level: Not on file  Occupational History   Not on file  Social Needs   Financial resource strain: Not on file   Food insecurity:    Worry: Not on file    Inability: Not on file   Transportation needs:    Medical: Not on file    Non-medical: Not on file  Tobacco Use   Smoking status: Current Every Day Smoker    Packs/day: 0.50    Years: 30.00    Pack years: 15.00    Types: Cigarettes   Smokeless tobacco: Never Used  Substance and Sexual Activity   Alcohol use: No   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   Sexual activity: Yes    Partners: Male    Birth control/protection: None  Lifestyle   Physical activity:    Days per week: Not on file    Minutes per session: Not on file   Stress: Not on file  Relationships   Social connections:    Talks on phone: Not on file    Gets together: Not on file    Attends religious service: Not on file    Active member of club or organization: Not on file    Attends meetings of clubs or organizations: Not on file    Relationship status: Not on file   Intimate partner violence:    Fear of current or ex partner: Not on file    Emotionally abused: Not on file    Physically abused: Not on file    Forced sexual activity: Not on  file  Other Topics Concern   Not on file  Social History Narrative   Not on file   Family History:  Family History  Problem Relation Age of Onset   Kidney failure Daughter    Sickle cell anemia Daughter    Sickle cell trait Daughter    Sickle cell anemia Son    Hypertension Mother    Hyperlipidemia Mother    Hypertension Father    Lupus Father        skin   Hypertension Other    Sarcoidosis Sister    Anesthesia problems Neg Hx    Hypotension Neg Hx    Malignant hyperthermia Neg Hx    Pseudochol deficiency Neg Hx     Review of Systems:  Constitutional: Denies fevers, chills or abnormal weight loss Eyes: Denies blurriness of vision Ears, nose, mouth, throat, and face: Denies mucositis or sore throat Respiratory: Denies cough, dyspnea or wheezes Cardiovascular: Denies palpitation, chest discomfort or lower extremity swelling Gastrointestinal:  Denies nausea, constipation, diarrhea GU: Denies dysuria or incontinence Skin: Denies abnormal skin rashes Neurological: Per HPI Musculoskeletal: Denies joint pain, back or neck discomfort. No decrease in ROM Behavioral/Psych: Denies anxiety, disturbance in thought content, and mood instability  Physical Exam: Vitals:   08/11/18 1101  BP: 138/77  Pulse: 70  Resp: 17  Temp: 98.6 F (37 C)  SpO2: 100%   KPS: 90. General: Alert, cooperative, pleasant, in no acute distress Head: Craniotomy scar noted, dry and intact. EENT: No conjunctival injection or scleral icterus. Oral mucosa moist Lungs: Resp effort normal Cardiac: Regular rate and rhythm Abdomen: Soft, non-distended abdomen Skin: No rashes cyanosis or petechiae. Extremities: No clubbing or edema  Neurologic Exam: Mental Status: Awake, alert, attentive to examiner. Oriented to self and environment. Language is fluent with intact comprehension.  Cranial Nerves: Visual acuity is grossly normal. Visual fields are full. Extra-ocular movements intact. No  ptosis. Face is symmetric, tongue midline. Motor: Tone and bulk are normal. Power is full in both arms and legs. Reflexes are symmetric, no pathologic reflexes present. Intact finger to nose bilaterally Sensory: Intact to light touch and temperature Gait: Normal and tandem gait is normal.   Labs: I have reviewed the data as listed    Component Value Date/Time   NA 139 07/29/2018 0837   K 3.7 07/29/2018 0837   CL 108 07/29/2018 0837   CO2 23 07/29/2018 0837   GLUCOSE 142 (H) 07/29/2018 0837   BUN 13 07/29/2018 0837   CREATININE 0.82 07/29/2018 0837   CREATININE 0.92 05/04/2015 0810   CALCIUM 9.2 07/29/2018 0837   PROT 7.8 12/10/2017 0823   ALBUMIN 3.9 12/10/2017 0823   AST 23 12/10/2017 0823   ALT 15 12/10/2017 0823   ALKPHOS 48 12/10/2017 0823   BILITOT 0.3 12/10/2017 0823   GFRNONAA >60 07/29/2018 0837   GFRAA >60 07/29/2018 0837   Lab Results  Component Value Date   WBC 14.2 (H) 07/28/2018   NEUTROABS 4.1 07/21/2018   HGB 11.1 (L) 07/28/2018   HCT 32.3 (L) 07/28/2018   MCV 84.6 07/28/2018   PLT 374 07/28/2018    Imaging:  Ct Head Wo Contrast  Result Date: 07/29/2018 CLINICAL DATA:  Follow-up.  Postop craniotomy. EXAM: CT HEAD WITHOUT CONTRAST TECHNIQUE: Contiguous axial images were obtained from the base of the skull through the vertex without intravenous contrast. COMPARISON:  07/28/2018 and 07/21/2018 as well as MRI brain 07/21/2018 FINDINGS: Brain: Interval right occipital craniectomy defect. Minimal encephalomalacia deep to the craniectomy defect. Persistent minimal mass effect on the fourth ventricle. Minimal midline shift to the left of the cerebellum unchanged. The lateral and third ventricles are normal. Stable 6 mm colloid cyst in the midline superior third ventricle. Remainder of the supratentorial brain is unchanged. Vascular: No hyperdense vessel or unexpected calcification. Skull: Interval right occipital craniectomy defect as described. Sinuses/Orbits: Orbits  and sinuses are within normal. Other: None IMPRESSION: Interval right occipital craniectomy defect with mild underlying encephalomalacia. Mild persistent mass effect on the fourth ventricle with subtle midline shift of the cerebellum unchanged. Stable 6 mm colloid cyst. Electronically Signed   By: Marin Olp M.D.   On: 07/29/2018 12:06   Ct Head Wo Contrast  Result Date: 07/28/2018 CLINICAL DATA:  Brain neoplasm. Brain  lab study for preoperative planning. EXAM: CT HEAD WITHOUT CONTRAST TECHNIQUE: Contiguous axial images were obtained from the base of the skull through the vertex without intravenous contrast. COMPARISON:  MRI brain 07/21/2018 FINDINGS: Brain: Asymmetric hypoattenuation involving the right cerebellar hemisphere is again noted. Mass effect on the fourth ventricle and extension of cerebellar tonsils into the foramen magnum is again noted. Dural thickening is less well appreciated by CT. 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 Other acute intracranial abnormality is evident. No other significant extra-axial fluid collection is present. Ventricles are of normal size. Vascular: Atherosclerotic calcifications are present within the cavernous internal carotid arteries bilaterally without a hyperdense vessel. Skull: Calvarium is intact. No focal lytic or blastic lesions are present. Sinuses/Orbits: The paranasal sinuses and mastoid air cells are clear. The globes and orbits are within normal limits. IMPRESSION: 1. Similar appearance of asymmetric hypoattenuation involving the right cerebellum with mass effect on the posterior fossa. 2. Dural thickening about the right cerebellum is not well appreciated by CT. 3. Colloid cyst. Electronically Signed   By: San Morelle M.D.   On: 07/28/2018 07:34   Ct Head Wo Contrast  Result Date: 07/21/2018 CLINICAL DATA:  Headache 1 month EXAM: CT HEAD WITHOUT CONTRAST TECHNIQUE: Contiguous axial images were obtained from the base of the skull  through the vertex without intravenous contrast. COMPARISON:  None. 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 Mass-effect in the posterior fossa with effacement of the prepontine cistern and cerebellar folia. Asymmetric hypodensity in the right medial cerebellum with mass-effect on the fourth ventricle. Cerebellar tonsils extend below the foramen magnum. Findings concerning for cerebellar mass. No associated hemorrhage. Ventricles may be slightly prominent possibly due to obstructive hydrocephalus. Rounded frontal horns. Vascular: Normal arterial flow voids Skull: Negative Sinuses/Orbits: Negative Other: None IMPRESSION: Hypodensity right medial cerebellum with mass-effect on the fourth ventricle and possible early hydrocephalus. Downward herniation of cerebellar tonsils. Findings concerning for cerebellar mass. Recommend MRI brain without and with contrast 6 mm colloid cyst in the foramen Monro likely an incidental finding. Electronically Signed   By: Franchot Gallo M.D.   On: 07/21/2018 16:19   Ct Chest W Contrast  Result Date: 07/25/2018 CLINICAL DATA:  Cerebral edema. EXAM: CT CHEST, ABDOMEN, AND PELVIS WITH CONTRAST TECHNIQUE: Multidetector CT imaging of the chest, abdomen and pelvis was performed following the standard protocol during bolus administration of intravenous contrast. CONTRAST:  172mL OMNIPAQUE IOHEXOL 300 MG/ML  SOLN COMPARISON:  MRI of July 21, 2018.  CT scan of December 10, 2017. FINDINGS: CT CHEST FINDINGS Cardiovascular: No significant vascular findings. Normal heart size. No pericardial effusion. Mediastinum/Nodes: No enlarged mediastinal, hilar, or axillary lymph nodes. Thyroid gland, trachea, and esophagus demonstrate no significant findings. Lungs/Pleura: Lungs are clear. No pleural effusion or pneumothorax. Musculoskeletal: No chest wall mass or suspicious bone lesions identified. CT ABDOMEN PELVIS FINDINGS Hepatobiliary: No focal liver  abnormality is seen. No gallstones, gallbladder wall thickening, or biliary dilatation. Pancreas: Unremarkable. No pancreatic ductal dilatation or surrounding inflammatory changes. Spleen: Normal in size without focal abnormality. Adrenals/Urinary Tract: Adrenal glands are unremarkable. Kidneys are normal, without renal calculi, focal lesion, or hydronephrosis. Bladder is unremarkable. Stomach/Bowel: The stomach appears normal. There is no evidence of bowel obstruction or inflammation. Status post appendectomy. Vascular/Lymphatic: Aortic atherosclerosis. No enlarged abdominal or pelvic lymph nodes. Reproductive: Uterus and bilateral adnexa are unremarkable. Other: No abdominal wall hernia or abnormality. No abdominopelvic ascites. Musculoskeletal: No acute or significant osseous  findings. IMPRESSION: No significant abnormality seen in the chest, abdomen or pelvis. Aortic Atherosclerosis (ICD10-I70.0). Electronically Signed   By: Marijo Conception M.D.   On: 07/25/2018 14:39   Mr Jeri Cos And Wo Contrast  Result Date: 07/21/2018 CLINICAL DATA:  Headache.  Abnormal CT today EXAM: MRI HEAD WITHOUT AND WITH CONTRAST TECHNIQUE: Multiplanar, multiecho pulse sequences of the brain and surrounding structures were obtained without and with intravenous contrast. CONTRAST:  7 mL Gadovist IV COMPARISON:  CT head today FINDINGS: Brain: Image quality degraded by motion. Prominent dural thickening in the posterior fossa on the right surrounding the right cerebellar hemisphere and extending into the folia. Dural thickening is somewhat lobular and measures up to 11 mm in thickness. There is associated edema in the right cerebellum with mass-effect on the fourth ventricle as noted on CT. Fourth ventricle effaced with mild obstructive hydrocephalus. Edema extends into the cerebellar vermis and a small amount into the left cerebellum. Dural thickening extends into the spinal canal surrounding the spinal canal at the craniocervical  junction. No significant dural thickening in the posterior fossa on the left or in the cerebral hemispheres. Ventricle size mildly enlarged. 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 No acute infarct. No significant white matter disease in the cerebral hemispheres. Vascular: Normal arterial flow voids. Negative for venous sinus thrombosis. Skull and upper cervical spine: Negative Sinuses/Orbits: Negative Other: None IMPRESSION: Extensive dural thickening surrounding the right cerebellar hemisphere with mass-effect and edema in the right cerebellum. The process appears to extend into the upper cervical canal. Mild obstructive hydrocephalus. Differential includes tumor including metastatic disease and lymphoma. Chronic inflammatory process such as sarcoid is a consideration however chest x-ray negative. TB and atypical infection/fungus also possible. 6 mm colloid cyst felt to be a separate problem. These results were called by telephone at the time of interpretation on 07/21/2018 at 6:01 pm to Dr. Nanda Quinton , who verbally acknowledged these results. Electronically Signed   By: Franchot Gallo M.D.   On: 07/21/2018 18:03   Ct Abdomen Pelvis W Contrast  Result Date: 07/25/2018 CLINICAL DATA:  Cerebral edema. EXAM: CT CHEST, ABDOMEN, AND PELVIS WITH CONTRAST TECHNIQUE: Multidetector CT imaging of the chest, abdomen and pelvis was performed following the standard protocol during bolus administration of intravenous contrast. CONTRAST:  146mL OMNIPAQUE IOHEXOL 300 MG/ML  SOLN COMPARISON:  MRI of July 21, 2018.  CT scan of December 10, 2017. FINDINGS: CT CHEST FINDINGS Cardiovascular: No significant vascular findings. Normal heart size. No pericardial effusion. Mediastinum/Nodes: No enlarged mediastinal, hilar, or axillary lymph nodes. Thyroid gland, trachea, and esophagus demonstrate no significant findings. Lungs/Pleura: Lungs are clear. No pleural effusion or pneumothorax.  Musculoskeletal: No chest wall mass or suspicious bone lesions identified. CT ABDOMEN PELVIS FINDINGS Hepatobiliary: No focal liver abnormality is seen. No gallstones, gallbladder wall thickening, or biliary dilatation. Pancreas: Unremarkable. No pancreatic ductal dilatation or surrounding inflammatory changes. Spleen: Normal in size without focal abnormality. Adrenals/Urinary Tract: Adrenal glands are unremarkable. Kidneys are normal, without renal calculi, focal lesion, or hydronephrosis. Bladder is unremarkable. Stomach/Bowel: The stomach appears normal. There is no evidence of bowel obstruction or inflammation. Status post appendectomy. Vascular/Lymphatic: Aortic atherosclerosis. No enlarged abdominal or pelvic lymph nodes. Reproductive: Uterus and bilateral adnexa are unremarkable. Other: No abdominal wall hernia or abnormality. No abdominopelvic ascites. Musculoskeletal: No acute or significant osseous findings. IMPRESSION: No significant abnormality seen in the chest, abdomen or pelvis. Aortic Atherosclerosis (ICD10-I70.0). Electronically Signed  By: Marijo Conception M.D.   On: 07/25/2018 14:39    Pathology:   Assessment/Plan 1. Non-hodgkins B-cell lymphoma [facility])  Amber Drake is stable following her recent brain/meningeal biopsy.  Today we introduced the pathology findings and some of its implications.  She understands that complete staging will ultimately provide a clear picture of prognosis and ideal treatment pathways moving forward.  Because the bulky mass is present on the "systemic" side of the blood brain barrier, this is not considered to be primary CNS lymphoma. The there may be some infiltration of neoplastic tissue into the parenchyma of the posterior fossa, per the path report and discussion during brain/spine tumor board meeting.  Referral will be placed for consultation with medical oncology to arrange staging and workup.  That said, a whole body-PET will be ordered and scheduled  today.  Further workup, such as bone marrow aspiration, will be left to med-onc.  Yield may be limited at this time by dosing of dexamethasone.  Because of relatively short duration of therapy, she may be able to abruptly discontinue decadron without endocrine effects.  She understands to contact us if she begins to feel weak, faint, fatigued and that steroid she be resumed in that case (likely at 4mg  daily, then weaned).   For CNS disease, treatment plan will likely be coordinated in collaboration with input from medical oncology and radiation oncology.  Utilization of a CNS penetrant agent (ie etoposide) vs coverage of posterior fossa with RT vs HD-MTX could be considered.    We appreciate the opportunity to participate in the care of Koda Defrank.   Screening for potential clinical trials was performed and discussed using eligibility criteria for active protocols at [facility], loco-regional tertiary centers, as well as national database available on [email or web address].    We spent twenty additional minutes teaching regarding the natural history, biology, and historical experience in the treatment of brain tumors. We then discussed in detail the current recommendations for therapy focusing on the mode of administration, mechanism of action, anticipated toxicities, and quality of life issues associated with this plan. We also provided teaching sheets for the patient to take home as an additional resource.  All questions were answered. The patient knows to call the clinic with any problems, questions or concerns. No barriers to learning were detected.  The total time spent in the encounter was 60 minutes and more than 50% was on counseling and review of test results   Ventura Sellers, MD Medical Director of Neuro-Oncology [facility] at [place] 08/11/18 10:59 AM

## 2018-08-13 ENCOUNTER — Inpatient Hospital Stay (HOSPITAL_BASED_OUTPATIENT_CLINIC_OR_DEPARTMENT_OTHER): Payer: BLUE CROSS/BLUE SHIELD | Admitting: Hematology

## 2018-08-13 ENCOUNTER — Other Ambulatory Visit: Payer: Self-pay

## 2018-08-13 ENCOUNTER — Inpatient Hospital Stay: Payer: BLUE CROSS/BLUE SHIELD

## 2018-08-13 VITALS — BP 136/67 | HR 64 | Temp 98.0°F | Resp 17 | Ht 67.0 in | Wt 166.5 lb

## 2018-08-13 DIAGNOSIS — M1990 Unspecified osteoarthritis, unspecified site: Secondary | ICD-10-CM | POA: Diagnosis not present

## 2018-08-13 DIAGNOSIS — I10 Essential (primary) hypertension: Secondary | ICD-10-CM | POA: Diagnosis not present

## 2018-08-13 DIAGNOSIS — R51 Headache: Secondary | ICD-10-CM

## 2018-08-13 DIAGNOSIS — G936 Cerebral edema: Secondary | ICD-10-CM

## 2018-08-13 DIAGNOSIS — C8331 Diffuse large B-cell lymphoma, lymph nodes of head, face, and neck: Secondary | ICD-10-CM

## 2018-08-13 DIAGNOSIS — R220 Localized swelling, mass and lump, head: Secondary | ICD-10-CM | POA: Diagnosis not present

## 2018-08-13 DIAGNOSIS — F17210 Nicotine dependence, cigarettes, uncomplicated: Secondary | ICD-10-CM | POA: Diagnosis not present

## 2018-08-13 DIAGNOSIS — D573 Sickle-cell trait: Secondary | ICD-10-CM

## 2018-08-13 DIAGNOSIS — G911 Obstructive hydrocephalus: Secondary | ICD-10-CM | POA: Diagnosis not present

## 2018-08-13 DIAGNOSIS — E7800 Pure hypercholesterolemia, unspecified: Secondary | ICD-10-CM

## 2018-08-13 DIAGNOSIS — Z79899 Other long term (current) drug therapy: Secondary | ICD-10-CM

## 2018-08-13 DIAGNOSIS — C884 Extranodal marginal zone B-cell lymphoma of mucosa-associated lymphoid tissue [MALT-lymphoma]: Secondary | ICD-10-CM

## 2018-08-13 LAB — CMP (CANCER CENTER ONLY)
ALT: 24 U/L (ref 0–44)
AST: 15 U/L (ref 15–41)
Albumin: 3.6 g/dL (ref 3.5–5.0)
Alkaline Phosphatase: 55 U/L (ref 38–126)
Anion gap: 9 (ref 5–15)
BUN: 23 mg/dL — ABNORMAL HIGH (ref 6–20)
CO2: 29 mmol/L (ref 22–32)
Calcium: 9.5 mg/dL (ref 8.9–10.3)
Chloride: 97 mmol/L — ABNORMAL LOW (ref 98–111)
Creatinine: 0.92 mg/dL (ref 0.44–1.00)
GFR, Est AFR Am: >60 mL/min (ref >60)
GFR, Estimated: >60 mL/min (ref >60)
Glucose, Bld: 121 mg/dL — ABNORMAL HIGH (ref 70–99)
Potassium: 3.6 mmol/L (ref 3.5–5.1)
Sodium: 135 mmol/L (ref 135–145)
Total Bilirubin: 0.4 mg/dL (ref 0.3–1.2)
Total Protein: 7.2 g/dL (ref 6.5–8.1)

## 2018-08-13 LAB — CBC WITH DIFFERENTIAL/PLATELET
Abs Immature Granulocytes: 0.04 10*3/uL (ref 0.00–0.07)
Basophils Absolute: 0 10*3/uL (ref 0.0–0.1)
Basophils Relative: 0 %
Eosinophils Absolute: 0 10*3/uL (ref 0.0–0.5)
Eosinophils Relative: 0 %
HCT: 35.3 % — ABNORMAL LOW (ref 36.0–46.0)
Hemoglobin: 12.1 g/dL (ref 12.0–15.0)
Immature Granulocytes: 0 %
Lymphocytes Relative: 6 %
Lymphs Abs: 0.7 10*3/uL (ref 0.7–4.0)
MCH: 29.1 pg (ref 26.0–34.0)
MCHC: 34.3 g/dL (ref 30.0–36.0)
MCV: 84.9 fL (ref 80.0–100.0)
Monocytes Absolute: 0.2 10*3/uL (ref 0.1–1.0)
Monocytes Relative: 2 %
Neutro Abs: 9.2 10*3/uL — ABNORMAL HIGH (ref 1.7–7.7)
Neutrophils Relative %: 92 %
Platelets: 390 10*3/uL (ref 150–400)
RBC: 4.16 MIL/uL (ref 3.87–5.11)
RDW: 14.2 % (ref 11.5–15.5)
WBC: 10.1 10*3/uL (ref 4.0–10.5)
nRBC: 0.2 % (ref 0.0–0.2)

## 2018-08-13 LAB — LACTATE DEHYDROGENASE: LDH: 194 U/L — ABNORMAL HIGH (ref 98–192)

## 2018-08-13 LAB — SEDIMENTATION RATE: Sed Rate: 10 mm/hr (ref 0–22)

## 2018-08-13 NOTE — Progress Notes (Signed)
HEMATOLOGY/ONCOLOGY CONSULTATION NOTE  Date of Service: 08/13/2018  Patient Care Team: [name], MD as PCP - General (Internal Medicine)  CHIEF COMPLAINTS/PURPOSE OF CONSULTATION:  Newly Diagnosed Extranodal Marginal Zone Lymphoma  HISTORY OF PRESENTING ILLNESS:   Amber Drake is a wonderful 58 y.o. female who has been referred to [place] by my colleague Dr. Cecil Cobbs in Neuro-Oncology for evaluation and management of her Newly diagnosed Extranodal Marginal Zone Lymphoma. She is accompanied today by her sister [name] via [employer]. The pt reports that she is doing well overall.  Prior to today's visit, the pt presented to the ED on 07/21/18 with complaints of a headache for the past month which presented intermittently and was worsened by bending, coughing, or laughing. She had a CT Head and MRI Brain which revealed extensive dural thickening, as noted below. She then had a right posterior fossa craniectomy on 07/28/18 for open exicisional biopsies of the right cerebellar hemisphere and dura which revealed Extranodal marginal zone lymphoma. The pt then established care with my colleague Dr. Cecil Cobbs in Neuro-Onc on 08/11/18. She has been taking 97m Decadron daily.  The pt reports that developed worsened headaches about a month ago, and notes that when she laughed it felt like "electiricty" was running through the sides and back of her head. She notes that some mild headaches had begun a couple months ago. Denies double vision, changes in vision, loss of balance, or other senses being affected. The pt notes that in addition to this, she developed redness in the eyes in the past couple months, saw eye doctor and was prescribed eye drops. Denies other concerns in the last 6 months including bone pains, fatigue, fevers, chills, night sweats or unexpected weight loss. She notes that her headaches have resolved. She notes she began Decadron after her biopsy. She denies having "any symptoms  whatsoever," currently.   Of note prior to the patient's visit today, pt has had an MRI Brain completed on 07/21/18 with results revealing "Extensive dural thickening surrounding the right cerebellar hemisphere with mass-effect and edema in the right cerebellum. The process appears to extend into the upper cervical canal. Mild obstructive hydrocephalus. Differential includes tumor including metastatic disease and lymphoma. Chronic inflammatory process such as sarcoid is a consideration however chest x-ray negative. TB and atypical infection/fungus also possible. 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  On review of systems, pt reports good energy levels, headache resolution, and denies joint issues, skin rashes, dry mouth, dry eyes, fevers, chills, night sweats, bone pains, unexpected weight loss, fatigue, double vision, loss of vision, affected senses, loss of balance, and any other symptoms.   On PMHx the pt reports sickle cel trait, HTN, hyperlipidemia. On Social Hx the pt reports that she smoked 1 ppd for the last 42 years, and reports having quit two days ago. 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 On Family Hx the pt reports dad with lupus and sister with sarcoidosis. Daughter and son with sickle cell disease. Denies other blood disorders or cancers. Endorses Penicillin allergy   MEDICAL HISTORY:  Past Medical History:  Diagnosis Date   Anemia    Arthritis  Blood dyscrasia    sickle cell trait   Carbuncle and furuncle    Hypercholesterolemia    Hypertension    does not take meds   Sickle cell trait ([place])     SURGICAL HISTORY: Past Surgical History:  Procedure Laterality Date   APPENDECTOMY      APPLICATION OF CRANIAL NAVIGATION N/A 07/28/2018   Procedure: APPLICATION OF CRANIAL NAVIGATION;  Surgeon: Kary Kos, MD;  Location: [facility];  Service: Neurosurgery;  Laterality: N/A;   CARPAL TUNNEL RELEASE  03/23/2011   Procedure: CARPAL TUNNEL RELEASE;  Surgeon: Sanjuana Kava;  Location: [facility];  Service: Orthopedics;  Laterality: Left;   CARPAL TUNNEL RELEASE  05/10/2011   Procedure: CARPAL TUNNEL RELEASE;  Surgeon: Sanjuana Kava, MD;  Location: [facility];  Service: Orthopedics;  Laterality: Right;   CESAREAN SECTION     x 2   INCISION AND DRAINAGE PERIRECTAL ABSCESS  12/21/09   PR DURAL GRAFT REPAIR,SPINE DEFECT N/A 07/28/2018   Procedure: Stereotactic open biopsy of Right cerebellar hemisphere and dura with brainlab;  Surgeon: Kary Kos, MD;  Location: [facility];  Service: Neurosurgery;  Laterality: N/A;  Stereotactic open biopsy of Right cerebellar hemisphere and dura with brainlab   PROCTOSCOPY  10/17/2010   Procedure: PROCTOSCOPY;  Surgeon: Scherry Ran;  Location: [facility];  Service: General;  Laterality: N/A;  Rigid Proctoscopy/Possible Fistula in Ano  Procedure ended at [place]     x2    SOCIAL HISTORY: Social History   Socioeconomic History   Marital status: Legally Separated    Spouse name: Not on file   Number of children: Not on file   Years of education: Not on file   Highest education level: Not on file  Occupational History   Not on file  Social Needs   Financial resource strain: Not on file   Food insecurity:    Worry: Not on file    Inability: Not on file   Transportation needs:    Medical: Not on file    Non-medical: Not on file  Tobacco Use   Smoking status: Current Every Day Smoker    Packs/day: 0.50    Years: 30.00    Pack years: 15.00    Types: Cigarettes   Smokeless tobacco: Never Used  Substance and Sexual Activity   Alcohol use: No   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   Sexual activity: Yes     Partners: Male    Birth control/protection: None  Lifestyle   Physical activity:    Days per week: Not on file    Minutes per session: Not on file   Stress: Not on file  Relationships   Social connections:    Talks on phone: Not on file    Gets together: Not on file    Attends religious service: Not on file    Active member of club or organization: Not on file    Attends meetings of clubs or organizations: Not on file    Relationship status: Not on file   Intimate partner violence:    Fear of current or ex partner: Not on file    Emotionally abused: Not on file    Physically abused: Not on file    Forced sexual activity: Not on file  Other Topics Concern   Not on file  Social History Narrative   Not on file    FAMILY HISTORY: Family History  Problem Relation Age of  Onset   Kidney failure Daughter    Sickle cell anemia Daughter    Sickle cell trait Daughter    Sickle cell anemia Son    Hypertension Mother    Hyperlipidemia Mother    Hypertension Father    Lupus Father        skin   Hypertension Other    Sarcoidosis Sister    Anesthesia problems Neg Hx    Hypotension Neg Hx    Malignant hyperthermia Neg Hx    Pseudochol deficiency Neg Hx     ALLERGIES:  is allergic to penicillins; penicillins cross reactors; and tape.  MEDICATIONS:  Current Outpatient Medications  Medication Sig Dispense Refill   albuterol (PROAIR HFA) 108 (90 Base) MCG/ACT inhaler Inhale 2 puffs into the lungs 4 (four) times daily as needed.     atorvastatin (LIPITOR) 20 MG tablet Take 1 tablet (20 mg total) by mouth daily. 90 tablet 2   cyclobenzaprine (FLEXERIL) 10 MG tablet Take 1 tablet (10 mg total) by mouth 3 (three) times daily as needed for muscle spasms. (Patient not taking: Reported on 08/11/2018) 40 tablet 0   dexamethasone (DECADRON) 4 MG tablet Take 1 tablet (4 mg total) by mouth daily. 30 tablet 0   ferrous sulfate 325 (65 FE) MG tablet Take 325 mg by mouth  daily with breakfast.     HYDROcodone-acetaminophen (NORCO) 7.5-325 MG tablet Take 1 tablet by mouth every 8 (eight) hours as needed.     lisinopril-hydrochlorothiazide (PRINZIDE,ZESTORETIC) 20-12.5 MG tablet Take 1 tablet by mouth daily.     Multiple Vitamin (MULTIVITAMIN WITH MINERALS) TABS tablet Take 1 tablet by mouth daily.     ranitidine (ZANTAC) 150 MG capsule Take 150 mg by mouth 2 (two) times daily.     No current facility-administered medications for this visit.     REVIEW OF SYSTEMS:    10 Point review of Systems was done is negative except as noted above.  PHYSICAL EXAMINATION: ECOG PERFORMANCE STATUS: 1 - Symptomatic but completely ambulatory  . Vitals:   08/13/18 1028  BP: 136/67  Pulse: 64  Resp: 17  Temp: 98 F (36.7 C)  SpO2: 100%   Filed Weights   08/13/18 1028  Weight: 166 lb 8 oz (75.5 kg)   .Body mass index is 26.08 kg/m.  GENERAL:alert, in no acute distress and comfortable SKIN: no acute rashes, no significant lesions EYES: conjunctiva are pink and non-injected, sclera anicteric OROPHARYNX: MMM, no exudates, no oropharyngeal erythema or ulceration NECK: supple, no JVD LYMPH:  no palpable lymphadenopathy in the cervical, axillary or inguinal regions LUNGS: clear to auscultation b/l with normal respiratory effort HEART: regular rate & rhythm ABDOMEN:  normoactive bowel sounds , non tender, not distended. No palpable hepatosplenomegaly. Extremity: no pedal edema PSYCH: alert & oriented x 3 with fluent speech NEURO: no focal motor/sensory deficits  LABORATORY DATA:  I have reviewed the data as listed  . CBC Latest Ref Rng & Units 07/28/2018 07/21/2018 12/10/2017  WBC 4.0 - 10.5 K/uL 14.2(H) 7.1 5.8  Hemoglobin 12.0 - 15.0 g/dL 11.1(L) 11.1(L) 11.7(L)  Hematocrit 36.0 - 46.0 % 32.3(L) 34.0(L) 35.1(L)  Platelets 150 - 400 K/uL 374 372 375    . CMP Latest Ref Rng & Units 07/29/2018 07/28/2018 07/21/2018  Glucose 70 - 99 mg/dL 142(H) 152(H) 97    BUN 6 - 20 mg/dL '13 11 12  ' Creatinine 0.44 - 1.00 mg/dL 0.82 0.89 0.74  Sodium 135 - 145 mmol/L 139 140 140  Potassium 3.5 -  5.1 mmol/L 3.7 3.5 3.4(L)  Chloride 98 - 111 mmol/L 108 103 102  CO2 22 - 32 mmol/L '23 25 30  ' Calcium 8.9 - 10.3 mg/dL 9.2 9.7 10.0  Total Protein 6.5 - 8.1 g/dL - - -  Total Bilirubin 0.3 - 1.2 mg/dL - - -  Alkaline Phos 38 - 126 U/L - - -  AST 15 - 41 U/L - - -  ALT 0 - 44 U/L - - -    07/28/18 Right cerebellar hemisphere and dura biopsy:     RADIOGRAPHIC STUDIES: I have personally reviewed the radiological images as listed and agreed with the findings in the report. Ct Head Wo Contrast  Result Date: 07/29/2018 CLINICAL DATA:  Follow-up.  Postop craniotomy. EXAM: CT HEAD WITHOUT CONTRAST TECHNIQUE: Contiguous axial images were obtained from the base of the skull through the vertex without intravenous contrast. COMPARISON:  07/28/2018 and 07/21/2018 as well as MRI brain 07/21/2018 FINDINGS: Brain: Interval right occipital craniectomy defect. Minimal encephalomalacia deep to the craniectomy defect. Persistent minimal mass effect on the fourth ventricle. Minimal midline shift to the left of the cerebellum unchanged. The lateral and third ventricles are normal. Stable 6 mm colloid cyst in the midline superior third ventricle. Remainder of the supratentorial brain is unchanged. Vascular: No hyperdense vessel or unexpected calcification. Skull: Interval right occipital craniectomy defect as described. Sinuses/Orbits: Orbits and sinuses are within normal. Other: None IMPRESSION: Interval right occipital craniectomy defect with mild underlying encephalomalacia. Mild persistent mass effect on the fourth ventricle with subtle midline shift of the cerebellum unchanged. Stable 6 mm colloid cyst. Electronically Signed   By: Marin Olp M.D.   On: 07/29/2018 12:06   Ct Head Wo Contrast  Result Date: 07/28/2018 CLINICAL DATA:  Brain neoplasm. Brain lab study for preoperative  planning. EXAM: CT HEAD WITHOUT CONTRAST TECHNIQUE: Contiguous axial images were obtained from the base of the skull through the vertex without intravenous contrast. COMPARISON:  MRI brain 07/21/2018 FINDINGS: Brain: Asymmetric hypoattenuation involving the right cerebellar hemisphere is again noted. Mass effect on the fourth ventricle and extension of cerebellar tonsils into the foramen magnum is again noted. Dural thickening is less well appreciated by CT. 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 Other acute intracranial abnormality is evident. No other significant extra-axial fluid collection is present. Ventricles are of normal size. Vascular: Atherosclerotic calcifications are present within the cavernous internal carotid arteries bilaterally without a hyperdense vessel. Skull: Calvarium is intact. No focal lytic or blastic lesions are present. Sinuses/Orbits: The paranasal sinuses and mastoid air cells are clear. The globes and orbits are within normal limits. IMPRESSION: 1. Similar appearance of asymmetric hypoattenuation involving the right cerebellum with mass effect on the posterior fossa. 2. Dural thickening about the right cerebellum is not well appreciated by CT. 3. Colloid cyst. Electronically Signed   By: San Morelle M.D.   On: 07/28/2018 07:34   Ct Head Wo Contrast  Result Date: 07/21/2018 CLINICAL DATA:  Headache 1 month EXAM: CT HEAD WITHOUT CONTRAST TECHNIQUE: Contiguous axial images were obtained from the base of the skull through the vertex without intravenous contrast. COMPARISON:  None. 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 Mass-effect in the posterior fossa with effacement of the prepontine cistern and cerebellar folia. Asymmetric hypodensity in the right medial cerebellum with mass-effect on the fourth ventricle. Cerebellar tonsils extend below the foramen magnum. Findings concerning for cerebellar mass. No associated  hemorrhage. Ventricles may be  slightly prominent possibly due to obstructive hydrocephalus. Rounded frontal horns. Vascular: Normal arterial flow voids Skull: Negative Sinuses/Orbits: Negative Other: None IMPRESSION: Hypodensity right medial cerebellum with mass-effect on the fourth ventricle and possible early hydrocephalus. Downward herniation of cerebellar tonsils. Findings concerning for cerebellar mass. Recommend MRI brain without and with contrast 6 mm colloid cyst in the foramen Monro likely an incidental finding. Electronically Signed   By: Franchot Gallo M.D.   On: 07/21/2018 16:19   Ct Chest W Contrast  Result Date: 07/25/2018 CLINICAL DATA:  Cerebral edema. EXAM: CT CHEST, ABDOMEN, AND PELVIS WITH CONTRAST TECHNIQUE: Multidetector CT imaging of the chest, abdomen and pelvis was performed following the standard protocol during bolus administration of intravenous contrast. CONTRAST:  193m OMNIPAQUE IOHEXOL 300 MG/ML  SOLN COMPARISON:  MRI of July 21, 2018.  CT scan of December 10, 2017. FINDINGS: CT CHEST FINDINGS Cardiovascular: No significant vascular findings. Normal heart size. No pericardial effusion. Mediastinum/Nodes: No enlarged mediastinal, hilar, or axillary lymph nodes. Thyroid gland, trachea, and esophagus demonstrate no significant findings. Lungs/Pleura: Lungs are clear. No pleural effusion or pneumothorax. Musculoskeletal: No chest wall mass or suspicious bone lesions identified. CT ABDOMEN PELVIS FINDINGS Hepatobiliary: No focal liver abnormality is seen. No gallstones, gallbladder wall thickening, or biliary dilatation. Pancreas: Unremarkable. No pancreatic ductal dilatation or surrounding inflammatory changes. Spleen: Normal in size without focal abnormality. Adrenals/Urinary Tract: Adrenal glands are unremarkable. Kidneys are normal, without renal calculi, focal lesion, or hydronephrosis. Bladder is unremarkable. Stomach/Bowel: The stomach appears normal. There is no evidence of  bowel obstruction or inflammation. Status post appendectomy. Vascular/Lymphatic: Aortic atherosclerosis. No enlarged abdominal or pelvic lymph nodes. Reproductive: Uterus and bilateral adnexa are unremarkable. Other: No abdominal wall hernia or abnormality. No abdominopelvic ascites. Musculoskeletal: No acute or significant osseous findings. IMPRESSION: No significant abnormality seen in the chest, abdomen or pelvis. Aortic Atherosclerosis (ICD10-I70.0). Electronically Signed   By: JMarijo ConceptionM.D.   On: 07/25/2018 14:39   Mr BJeri CosAnd Wo Contrast  Result Date: 07/21/2018 CLINICAL DATA:  Headache.  Abnormal CT today EXAM: MRI HEAD WITHOUT AND WITH CONTRAST TECHNIQUE: Multiplanar, multiecho pulse sequences of the brain and surrounding structures were obtained without and with intravenous contrast. CONTRAST:  7 mL Gadovist IV COMPARISON:  CT head today FINDINGS: Brain: Image quality degraded by motion. Prominent dural thickening in the posterior fossa on the right surrounding the right cerebellar hemisphere and extending into the folia. Dural thickening is somewhat lobular and measures up to 11 mm in thickness. There is associated edema in the right cerebellum with mass-effect on the fourth ventricle as noted on CT. Fourth ventricle effaced with mild obstructive hydrocephalus. Edema extends into the cerebellar vermis and a small amount into the left cerebellum. Dural thickening extends into the spinal canal surrounding the spinal canal at the craniocervical junction. No significant dural thickening in the posterior fossa on the left or in the cerebral hemispheres. Ventricle size mildly enlarged. 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 No acute infarct. No significant white matter disease in the cerebral hemispheres. Vascular: Normal arterial flow voids. Negative for venous sinus thrombosis. Skull and upper cervical spine: Negative Sinuses/Orbits: Negative Other: None  IMPRESSION: Extensive dural thickening surrounding the right cerebellar hemisphere with mass-effect and edema in the right cerebellum. The process appears to extend into the upper cervical canal. Mild obstructive hydrocephalus. Differential includes tumor including metastatic disease and lymphoma. Chronic inflammatory process such as sarcoid is a consideration  however chest x-ray negative. TB and atypical infection/fungus also possible. 6 mm colloid cyst felt to be a separate problem. These results were called by telephone at the time of interpretation on 07/21/2018 at 6:01 pm to Dr. Nanda Quinton , who verbally acknowledged these results. Electronically Signed   By: Franchot Gallo M.D.   On: 07/21/2018 18:03   Ct Abdomen Pelvis W Contrast  Result Date: 07/25/2018 CLINICAL DATA:  Cerebral edema. EXAM: CT CHEST, ABDOMEN, AND PELVIS WITH CONTRAST TECHNIQUE: Multidetector CT imaging of the chest, abdomen and pelvis was performed following the standard protocol during bolus administration of intravenous contrast. CONTRAST:  113m OMNIPAQUE IOHEXOL 300 MG/ML  SOLN COMPARISON:  MRI of July 21, 2018.  CT scan of December 10, 2017. FINDINGS: CT CHEST FINDINGS Cardiovascular: No significant vascular findings. Normal heart size. No pericardial effusion. Mediastinum/Nodes: No enlarged mediastinal, hilar, or axillary lymph nodes. Thyroid gland, trachea, and esophagus demonstrate no significant findings. Lungs/Pleura: Lungs are clear. No pleural effusion or pneumothorax. Musculoskeletal: No chest wall mass or suspicious bone lesions identified. CT ABDOMEN PELVIS FINDINGS Hepatobiliary: No focal liver abnormality is seen. No gallstones, gallbladder wall thickening, or biliary dilatation. Pancreas: Unremarkable. No pancreatic ductal dilatation or surrounding inflammatory changes. Spleen: Normal in size without focal abnormality. Adrenals/Urinary Tract: Adrenal glands are unremarkable. Kidneys are normal, without renal calculi,  focal lesion, or hydronephrosis. Bladder is unremarkable. Stomach/Bowel: The stomach appears normal. There is no evidence of bowel obstruction or inflammation. Status post appendectomy. Vascular/Lymphatic: Aortic atherosclerosis. No enlarged abdominal or pelvic lymph nodes. Reproductive: Uterus and bilateral adnexa are unremarkable. Other: No abdominal wall hernia or abnormality. No abdominopelvic ascites. Musculoskeletal: No acute or significant osseous findings. IMPRESSION: No significant abnormality seen in the chest, abdomen or pelvis. Aortic Atherosclerosis (ICD10-I70.0). Electronically Signed   By: JMarijo ConceptionM.D.   On: 07/25/2018 14:39    ASSESSMENT & PLAN:  58y.o. female with  1. 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 The process appears to extend into the upper cervical canal. Mild obstructive hydrocephalus. Differential includes tumor including metastatic disease and lymphoma. Chronic inflammatory process such as sarcoid is a consideration however chest x-ray negative. TB and atypical infection/fungus also possible. 6 mm colloid cyst felt to be a separate problem." -Discussed the 07/25/18 CT C/A/P which did not reveal significant abnormality -Proceed with 08/21/18 PET/CT as scheduled -Continue 458mDecadron daily per Dr. VaMickeal SkinnerWill order BM Bx for complete staging and characterization as well -Will discuss this patient's case further in my tumor board -Will order blood tests today including IgG  subclass to rule out IgG4 diseases given association between meningeal extranodal marginal zone lymphoma [email or web address]-Will see the pt back in 2 weeks   Labs today PET/CT as scheduled on 08/21/2018 CT bone marrow biopsy in 2-3  Days RTC with Dr KaIrene Limbon 2 weeks   All of the patients questions were answered with apparent satisfaction. The patient knows to call the clinic with any problems, questions or concerns.  The total time spent in the appt was 45 minutes and more than 50% was on counseling and  direct patient cares.    Sullivan Lone MD MS AAHIVMS [facility] Hematology/Oncology [facility]  (Office):       [phone] (Work cell):  [phone] (Fax):           [phone]  08/13/2018 11:26 AM  I, Baldwin Jamaica, am acting as a scribe for Dr. Sullivan Lone.   .I have reviewed the above documentation for accuracy and completeness, and I agree with the above. Brunetta Genera MD

## 2018-08-14 ENCOUNTER — Telehealth: Payer: Self-pay | Admitting: Hematology

## 2018-08-14 LAB — MULTIPLE MYELOMA PANEL, SERUM
Albumin SerPl Elph-Mcnc: 3.5 g/dL (ref 2.9–4.4)
Albumin/Glob SerPl: 1.2 (ref 0.7–1.7)
Alpha 1: 0.2 g/dL (ref 0.0–0.4)
Alpha2 Glob SerPl Elph-Mcnc: 1 g/dL (ref 0.4–1.0)
B-Globulin SerPl Elph-Mcnc: 0.9 g/dL (ref 0.7–1.3)
Gamma Glob SerPl Elph-Mcnc: 1 g/dL (ref 0.4–1.8)
Globulin, Total: 3.1 g/dL (ref 2.2–3.9)
IgA: 214 mg/dL (ref 87–352)
IgG (Immunoglobin G), Serum: 1125 mg/dL (ref 586–1602)
IgM (Immunoglobulin M), Srm: 142 mg/dL (ref 26–217)
M Protein SerPl Elph-Mcnc: 0.5 g/dL — ABNORMAL HIGH
Total Protein ELP: 6.6 g/dL (ref 6.0–8.5)

## 2018-08-14 LAB — HEPATITIS B SURFACE ANTIGEN: Hepatitis B Surface Ag: NEGATIVE

## 2018-08-14 LAB — SJOGREN'S SYNDROME ANTIBODS(SSA + SSB)
SSA (Ro) (ENA) Antibody, IgG: <0.2 AI (ref 0.0–0.9)
SSB (La) (ENA) Antibody, IgG: <0.2 AI (ref 0.0–0.9)

## 2018-08-14 LAB — HEPATITIS B CORE ANTIBODY, TOTAL: Hep B Core Total Ab: NEGATIVE

## 2018-08-14 LAB — HEPATITIS C ANTIBODY: HCV Ab: 0.3 s/co ratio (ref 0.0–0.9)

## 2018-08-14 LAB — IGG 4: IgG, Subclass 4: 39 mg/dL (ref 2–96)

## 2018-08-14 LAB — ANGIOTENSIN CONVERTING ENZYME: Angiotensin-Converting Enzyme: 5 U/L — ABNORMAL LOW (ref 14–82)

## 2018-08-14 NOTE — Telephone Encounter (Signed)
Scheduled appt per 5/13 los. ° °Patient aware of appt date and time. °

## 2018-08-15 LAB — ANTINUCLEAR ANTIBODIES, IFA: ANA Ab, IFA: NEGATIVE

## 2018-08-20 ENCOUNTER — Other Ambulatory Visit: Payer: Self-pay | Admitting: *Deleted

## 2018-08-20 DIAGNOSIS — C8331 Diffuse large B-cell lymphoma, lymph nodes of head, face, and neck: Secondary | ICD-10-CM

## 2018-08-21 ENCOUNTER — Ambulatory Visit (HOSPITAL_COMMUNITY): Admission: RE | Admit: 2018-08-21 | Payer: BLUE CROSS/BLUE SHIELD | Source: Ambulatory Visit

## 2018-08-21 ENCOUNTER — Ambulatory Visit (HOSPITAL_COMMUNITY): Payer: BLUE CROSS/BLUE SHIELD

## 2018-08-28 ENCOUNTER — Telehealth: Payer: Self-pay | Admitting: *Deleted

## 2018-08-28 NOTE — Telephone Encounter (Signed)
Per Dr. Irene Limbo, reschedule appt for 5/29 following patient's PET scan. Contacted patient and gave number for Central Scheduling to allow her to schedule PET at a time convenient to her. Advised her that she might be able to arrange PET and BX (currently on 6/12) for same day, she can always ask them. Asked her to contact office when she has test arranged so that appt can be rescheduled. Appt for 5/29 cancelled. Patient verbalized understanding.

## 2018-08-29 ENCOUNTER — Inpatient Hospital Stay: Payer: BLUE CROSS/BLUE SHIELD | Admitting: Hematology

## 2018-09-08 ENCOUNTER — Telehealth: Payer: Self-pay | Admitting: *Deleted

## 2018-09-08 ENCOUNTER — Ambulatory Visit (HOSPITAL_COMMUNITY)
Admission: RE | Admit: 2018-09-08 | Discharge: 2018-09-08 | Disposition: A | Discharge status: Home / Self Care | Payer: BC Managed Care – PPO | Source: Ambulatory Visit | Attending: Internal Medicine | Admitting: Internal Medicine

## 2018-09-08 ENCOUNTER — Other Ambulatory Visit: Payer: Self-pay

## 2018-09-08 DIAGNOSIS — C8331 Diffuse large B-cell lymphoma, lymph nodes of head, face, and neck: Secondary | ICD-10-CM | POA: Diagnosis not present

## 2018-09-08 DIAGNOSIS — C8330 Diffuse large B-cell lymphoma, unspecified site: Secondary | ICD-10-CM | POA: Diagnosis not present

## 2018-09-08 LAB — GLUCOSE, CAPILLARY: Glucose-Capillary: 97 mg/dL (ref 70–99)

## 2018-09-08 IMAGING — CT NUCLEAR MEDICINE PET IMAGE INITIAL (PI) SKULL BASE TO THIGH
1 of 8 series · 3 of 16 positions shown, 4 images · non-contrast
Comparison: CT [facility] [DATE].

CLINICAL DATA: Initial treatment strategy for diffuse large B-cell
lymphoma.

EXAM:
NUCLEAR MEDICINE PET SKULL BASE TO THIGH
TECHNIQUE: 8.4 mCi F-18 FDG was injected intravenously. Full-ring PET imaging
was performed from the skull base to thigh after the radiotracer. CT
data was obtained and used for attenuation correction and anatomic
localization.
Fasting blood glucose: 97 mg/dl

[Series 4: ct sk_thigh 5.0 b31f · axial · 0.98mm/px · z∈[-947,-23]mm · 3 of 232 slices shown, 4 images]
[im 1/232  soft-tissue]
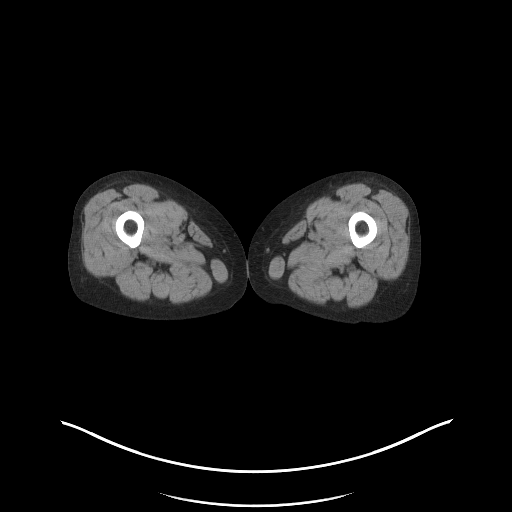
[im 1/232  bone]
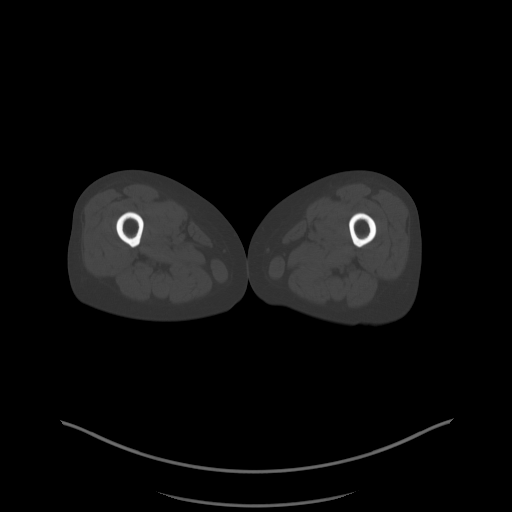
[im 116/232  soft-tissue]
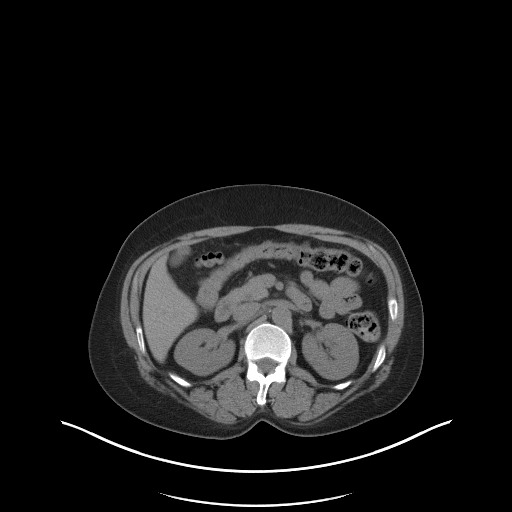
[im 232/232  soft-tissue]
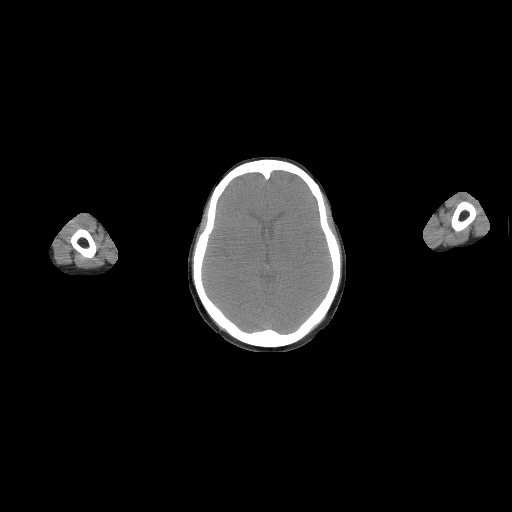

[3 of 16 positions shown; findings below may reference images not displayed]

FINDINGS: Mediastinal blood pool activity: SUV max

Liver activity: SUV max

NECK: Postoperative changes from right occipital craniectomy tumor.
Encephalomalacia in the right cerebellar hemisphere with
corresponding photopenic defect noted. No hypermetabolic lymph nodes
within the soft tissues of the neck.

Incidental CT findings: none

CHEST: No hypermetabolic axillary or supraclavicular lymph nodes. No
hypermetabolic mediastinal or hilar lymph nodes. No FDG avid
pulmonary nodule or mass.

Incidental CT findings: Mild changes of centrilobular emphysema.
Aortic atherosclerosis.

ABDOMEN/PELVIS: No abnormal uptake within the liver, pancreas,
spleen, or adrenal glands. Normal size spleen. No enlarged or
hypermetabolic lymph nodes within the abdomen or pelvis.

Incidental CT findings: Aortic atherosclerosis.  No aneurysm.

SKELETON: No focal hypermetabolic activity to suggest skeletal
metastasis. Extending from T12-L1 there is a focal area of increased
radiotracer uptake within the cord measuring 3 cm with SUV max of
2.8. Nonspecific.

Incidental CT findings: none
IMPRESSION: 1. No hypermetabolic mass or adenopathy identified within the chest
abdomen or pelvis to suggest metabolically active tumor.
2. Nonspecific focus of increased uptake within the cord extending
from T12 to L1. Cannot rule out additional site of CNS lymphoma.
Consider further evaluation with contrast enhanced MRI through this
area.
3. Aortic Atherosclerosis ([CC]-[CC]) and Emphysema ([CC]-[CC]).

## 2018-09-08 MED ORDER — FLUDEOXYGLUCOSE F - 18 (FDG) INJECTION: 8.4000 | Freq: Once | INTRAVENOUS | Status: DC | PRN

## 2018-09-08 NOTE — Telephone Encounter (Signed)
Patient called to ask if she could discontinue her steroid.  Per previous note from Dr Mickeal Skinner due to short course of therapy she could potentially abruptly discontinue her steroids.  Gave options on how to cut tablets in half and try half dose and then stop as she only has 1 tablet let.  She will try and if she has any unbearable side effects of abruptly stopping it she will call and we can discuss extending the dose to help her wean off of them slower.

## 2018-09-09 ENCOUNTER — Telehealth: Payer: Self-pay | Admitting: *Deleted

## 2018-09-09 MED ORDER — DEXAMETHASONE 2 MG PO TABS: 2.0000 mg | ORAL_TABLET | Freq: Every day | ORAL | 0 refills | Status: DC

## 2018-09-09 NOTE — Telephone Encounter (Signed)
Relayed updated taper instructions to patient.  She expressed understanding and no further questions at this time.

## 2018-09-09 NOTE — Telephone Encounter (Signed)
It has been ~1 month since I asked her to discontinue the dexamethasone.  If she has been taking it for that entire duration it is not safe to abruptly discontinue.  She should taper to 2mg  daily x1 week, then 1mg  daily x1 week, then stop.  I will order 2mg  tablets to her pharmacy  Ventura Sellers, MD

## 2018-09-09 NOTE — Addendum Note (Signed)
Addended by: Ventura Sellers on: 09/09/2018 12:06 PM   Modules accepted: Orders

## 2018-09-10 ENCOUNTER — Other Ambulatory Visit: Payer: Self-pay | Admitting: Radiology

## 2018-09-11 ENCOUNTER — Other Ambulatory Visit: Payer: Self-pay | Admitting: Radiology

## 2018-09-12 ENCOUNTER — Ambulatory Visit (HOSPITAL_COMMUNITY)
Admission: RE | Admit: 2018-09-12 | Discharge: 2018-09-12 | Disposition: A | Discharge status: Home / Self Care | Payer: BC Managed Care – PPO | Source: Ambulatory Visit | Attending: Hematology | Admitting: Hematology

## 2018-09-12 ENCOUNTER — Other Ambulatory Visit: Payer: Self-pay

## 2018-09-12 ENCOUNTER — Encounter (HOSPITAL_COMMUNITY): Payer: Self-pay

## 2018-09-12 DIAGNOSIS — I10 Essential (primary) hypertension: Secondary | ICD-10-CM | POA: Insufficient documentation

## 2018-09-12 DIAGNOSIS — Z79899 Other long term (current) drug therapy: Secondary | ICD-10-CM | POA: Diagnosis not present

## 2018-09-12 DIAGNOSIS — C884 Extranodal marginal zone B-cell lymphoma of mucosa-associated lymphoid tissue [MALT-lymphoma]: Secondary | ICD-10-CM | POA: Insufficient documentation

## 2018-09-12 DIAGNOSIS — D573 Sickle-cell trait: Secondary | ICD-10-CM | POA: Diagnosis not present

## 2018-09-12 DIAGNOSIS — E7800 Pure hypercholesterolemia, unspecified: Secondary | ICD-10-CM | POA: Diagnosis not present

## 2018-09-12 DIAGNOSIS — Z880 Allergy status to penicillin: Secondary | ICD-10-CM | POA: Insufficient documentation

## 2018-09-12 DIAGNOSIS — Z8249 Family history of ischemic heart disease and other diseases of the circulatory system: Secondary | ICD-10-CM | POA: Diagnosis not present

## 2018-09-12 DIAGNOSIS — D759 Disease of blood and blood-forming organs, unspecified: Secondary | ICD-10-CM | POA: Diagnosis not present

## 2018-09-12 DIAGNOSIS — F17210 Nicotine dependence, cigarettes, uncomplicated: Secondary | ICD-10-CM | POA: Diagnosis not present

## 2018-09-12 DIAGNOSIS — C729 Malignant neoplasm of central nervous system, unspecified: Secondary | ICD-10-CM | POA: Diagnosis not present

## 2018-09-12 DIAGNOSIS — D7589 Other specified diseases of blood and blood-forming organs: Secondary | ICD-10-CM | POA: Diagnosis not present

## 2018-09-12 DIAGNOSIS — D72829 Elevated white blood cell count, unspecified: Secondary | ICD-10-CM | POA: Diagnosis not present

## 2018-09-12 LAB — CBC WITH DIFFERENTIAL/PLATELET
Abs Immature Granulocytes: 0.08 K/uL — ABNORMAL HIGH (ref 0.00–0.07)
Basophils Absolute: 0 K/uL (ref 0.0–0.1)
Basophils Relative: 0 %
Eosinophils Absolute: 0 K/uL (ref 0.0–0.5)
Eosinophils Relative: 0 %
HCT: 35.2 % — ABNORMAL LOW (ref 36.0–46.0)
Hemoglobin: 11.8 g/dL — ABNORMAL LOW (ref 12.0–15.0)
Immature Granulocytes: 1 %
Lymphocytes Relative: 32 %
Lymphs Abs: 3.6 K/uL (ref 0.7–4.0)
MCH: 30.4 pg (ref 26.0–34.0)
MCHC: 33.5 g/dL (ref 30.0–36.0)
MCV: 90.7 fL (ref 80.0–100.0)
Monocytes Absolute: 0.6 K/uL (ref 0.1–1.0)
Monocytes Relative: 5 %
Neutro Abs: 6.9 K/uL (ref 1.7–7.7)
Neutrophils Relative %: 62 %
Platelets: 346 K/uL (ref 150–400)
RBC: 3.88 MIL/uL (ref 3.87–5.11)
RDW: 16.1 % — ABNORMAL HIGH (ref 11.5–15.5)
WBC: 11.2 K/uL — ABNORMAL HIGH (ref 4.0–10.5)
nRBC: 0 % (ref 0.0–0.2)

## 2018-09-12 LAB — PROTIME-INR
INR: 0.8 (ref 0.8–1.2)
Prothrombin Time: 11.3 seconds — ABNORMAL LOW (ref 11.4–15.2)

## 2018-09-12 IMAGING — CT CT BONE MARROW BIOPSY AND ASPIRATION
1 of 2 series · 15 of 28 positions shown, 19 images · non-contrast
Comparison: none

CLINICAL DATA: Marginal zone lymphoma of the meninges of the
posterior fossa. Bone marrow biopsy required for further staging.

[Series 2: i-spiral 5.0 b40f · axial · 0.98mm/px · z∈[-605,-535]mm · 15 of 24 slices shown, 19 images]
[im 2/24  mediastinal]
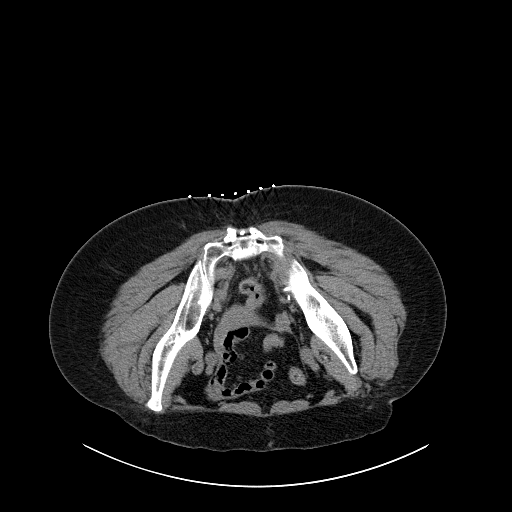
[im 2/24  lung]
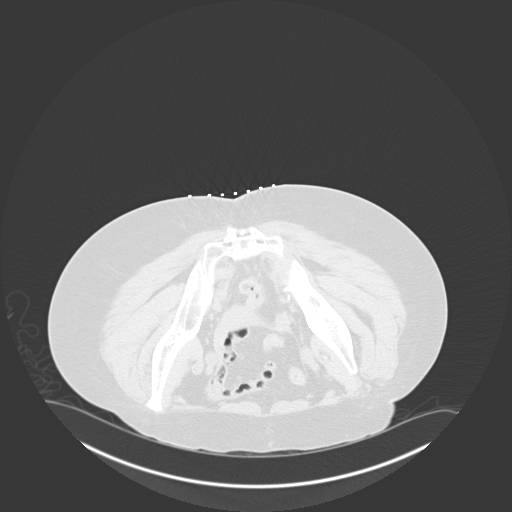
[im 3/24  lung]
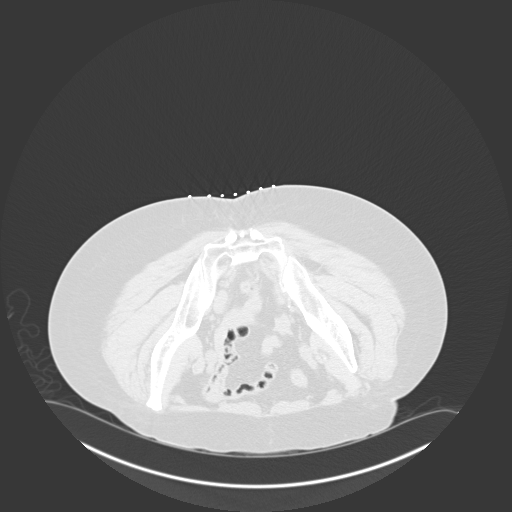
[im 5/24  lung]
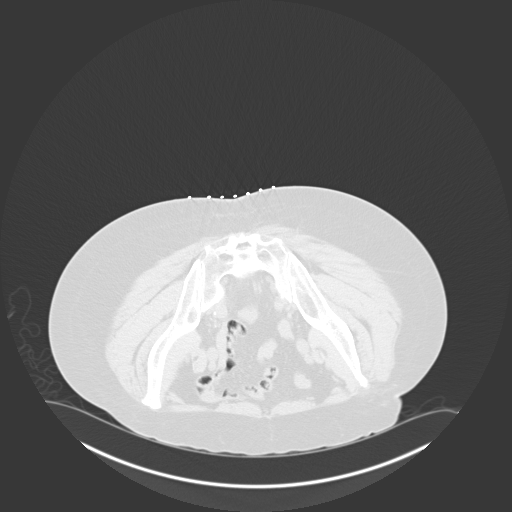
[im 7/24  lung]
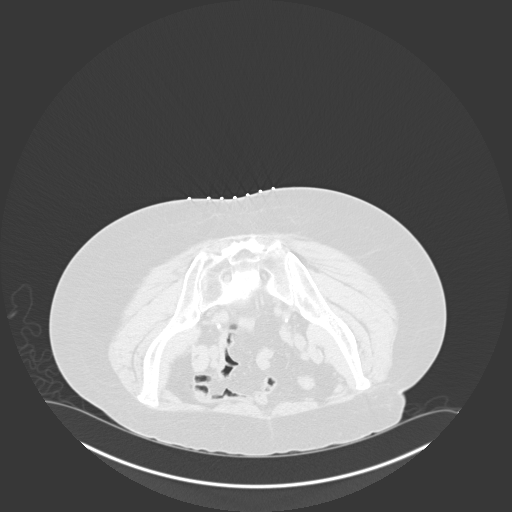
[im 8/24  mediastinal]
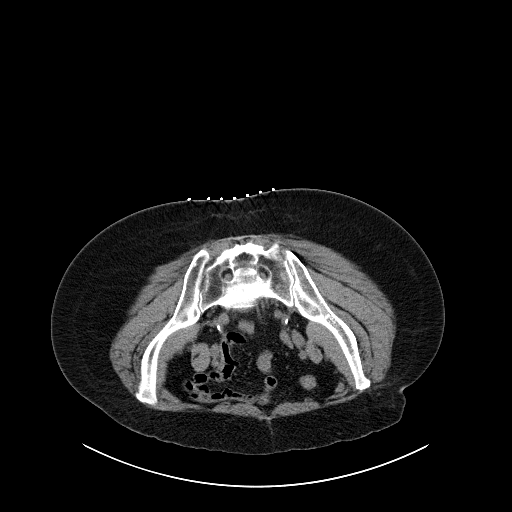
[im 8/24  lung]
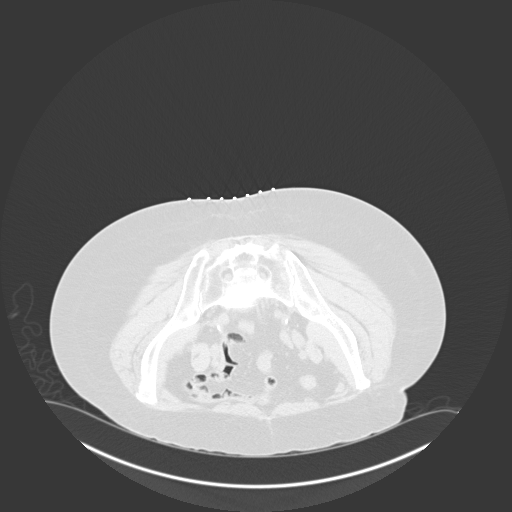
[im 9/24  lung]
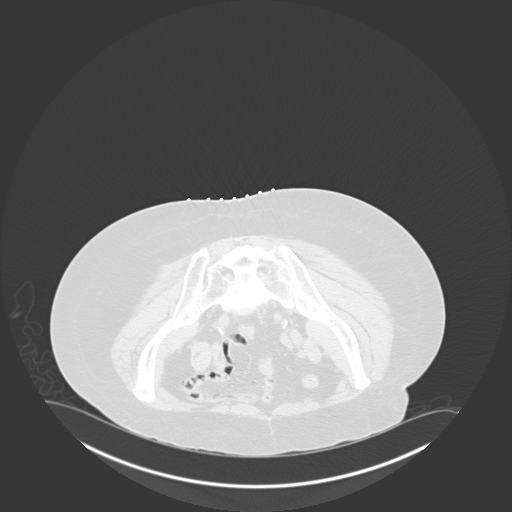
[im 10/24  lung]
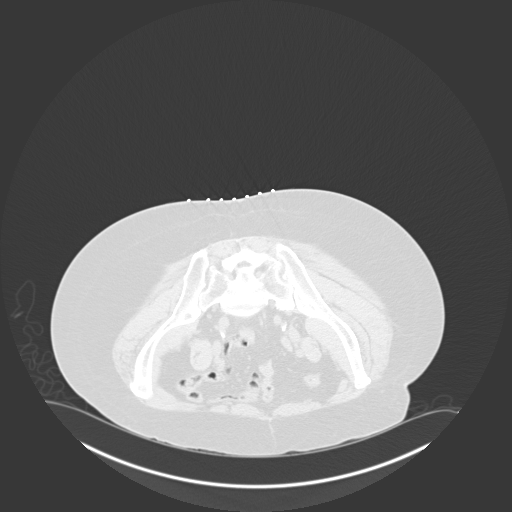
[im 13/24  lung]
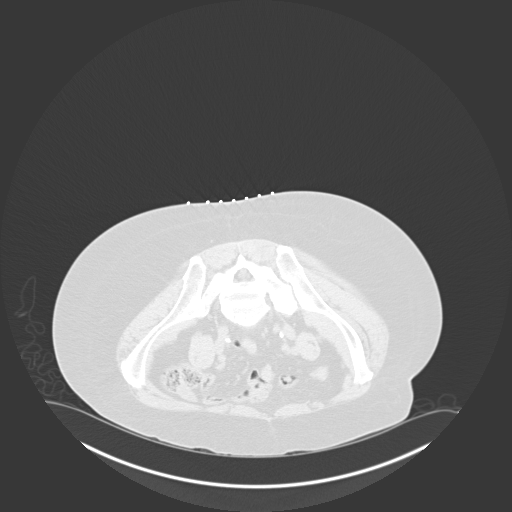
[im 14/24  mediastinal]
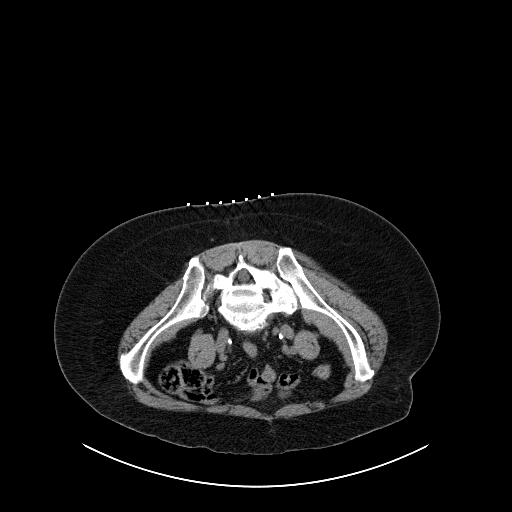
[im 14/24  lung]
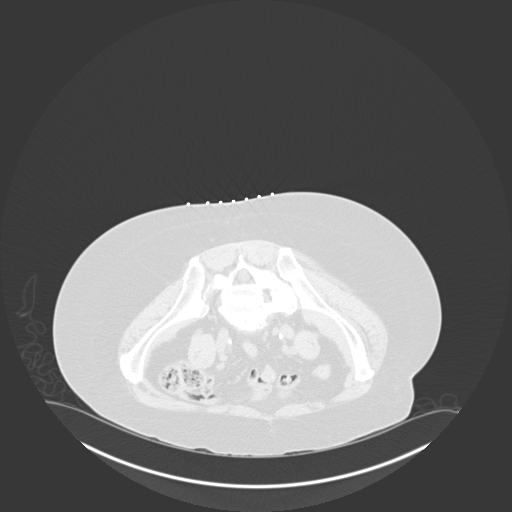
[im 15/24  lung]
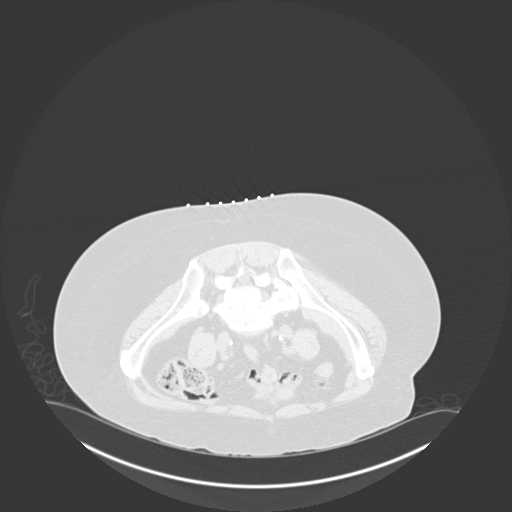
[im 16/24  lung]
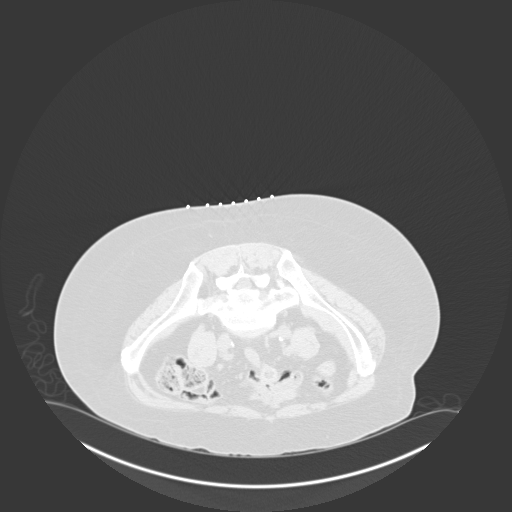
[im 17/24  lung]
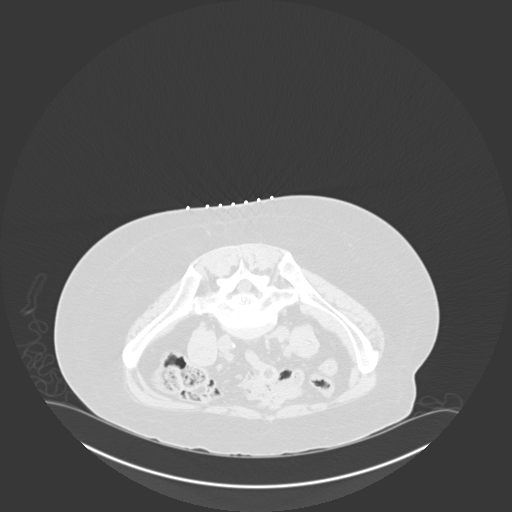
[im 20/24  mediastinal]
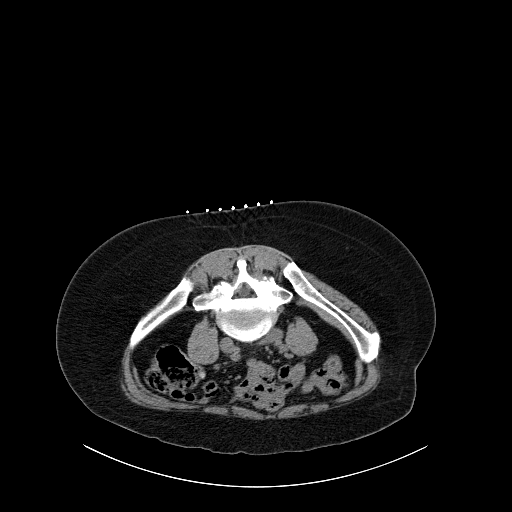
[im 20/24  lung]
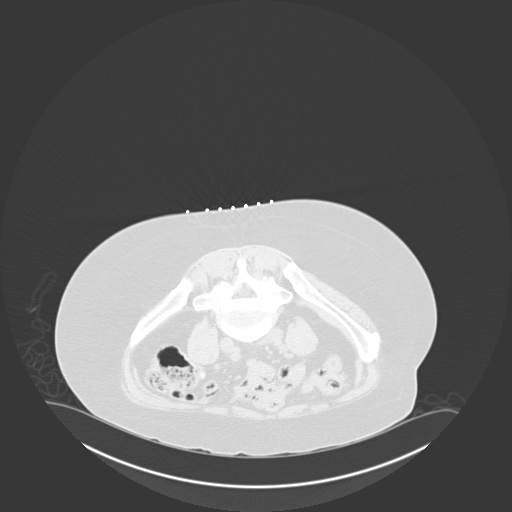
[im 21/24  lung]
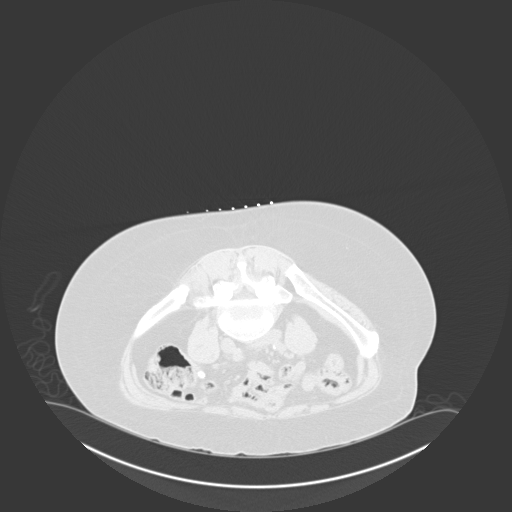
[im 22/24  lung]
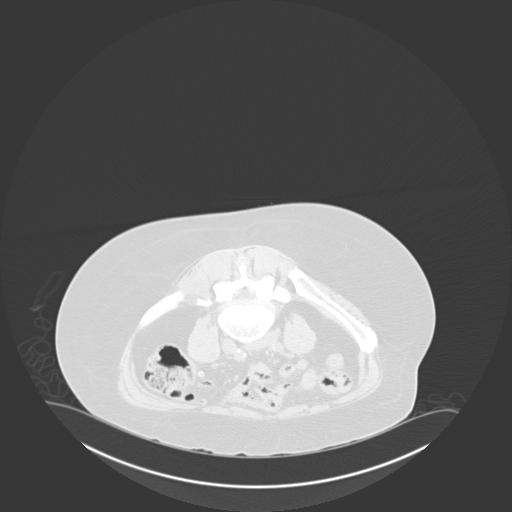

[15 of 28 positions shown; findings below may reference images not displayed]

EXAM:
CT GUIDED BONE MARROW ASPIRATION AND BIOPSY

ANESTHESIA/SEDATION:
Versed 4.0 mg IV, Fentanyl 100 mcg IV

Total Moderate Sedation Time:   19 minutes.

The patient's level of consciousness and physiologic status were
continuously monitored during the procedure by Radiology nursing.

PROCEDURE:
The procedure risks, benefits, and alternatives were explained to
the patient. Questions regarding the procedure were encouraged and
answered. The patient understands and consents to the procedure. A
time out was performed prior to initiating the procedure.

The right gluteal region was prepped with chlorhexidine. Sterile
gown and sterile gloves were used for the procedure. Local
anesthesia was provided with 1% Lidocaine.

Under CT guidance, an 11 gauge On Control bone cutting needle was
advanced from a posterior approach into the right iliac bone. Needle
positioning was confirmed with CT. Initial non heparinized and
heparinized aspirate samples were obtained of bone marrow. Core
biopsy was performed via the On Control drill needle.

COMPLICATIONS:
None
FINDINGS: Inspection of initial aspirate did reveal visible particles. Intact
core biopsy sample was obtained.
IMPRESSION: CT guided bone marrow biopsy of right posterior iliac bone with both
aspirate and core samples obtained.

## 2018-09-12 MED ORDER — MIDAZOLAM HCL 2 MG/2ML IJ SOLN
INTRAMUSCULAR | Status: AC | PRN
  Administered 2018-09-12 (×4): 1 mg via INTRAVENOUS

## 2018-09-12 MED ORDER — SODIUM CHLORIDE 0.9 % IV SOLN
INTRAVENOUS | Status: DC
  Administered 2018-09-12: 10:00:00 via INTRAVENOUS

## 2018-09-12 MED ORDER — FENTANYL CITRATE (PF) 100 MCG/2ML IJ SOLN
INTRAMUSCULAR | Status: AC
  Filled 2018-09-12: qty 4

## 2018-09-12 MED ORDER — NALOXONE HCL 0.4 MG/ML IJ SOLN
INTRAMUSCULAR | Status: AC
  Filled 2018-09-12: qty 1

## 2018-09-12 MED ORDER — FLUMAZENIL 0.5 MG/5ML IV SOLN
INTRAVENOUS | Status: AC
  Filled 2018-09-12: qty 5

## 2018-09-12 MED ORDER — MIDAZOLAM HCL 2 MG/2ML IJ SOLN
INTRAMUSCULAR | Status: AC
  Filled 2018-09-12: qty 4

## 2018-09-12 MED ORDER — LIDOCAINE HCL (PF) 1 % IJ SOLN
INTRAMUSCULAR | Status: AC | PRN
  Administered 2018-09-12 (×2): 10 mL

## 2018-09-12 MED ORDER — FENTANYL CITRATE (PF) 100 MCG/2ML IJ SOLN
INTRAMUSCULAR | Status: AC | PRN
  Administered 2018-09-12 (×2): 50 ug via INTRAVENOUS

## 2018-09-12 NOTE — Procedures (Signed)
Interventional Radiology Procedure Note  Procedure: CT guided bone marrow aspiration and biopsy  Complications: None  EBL: < 10 mL  Findings: Aspirate and core biopsy performed of bone marrow in right iliac bone.  Plan: Bedrest supine x 1 hrs  Maxim Bedel T. Adalind Weitz, M.D Pager:  [phone]   

## 2018-09-12 NOTE — Discharge Instructions (Signed)
Bone Marrow Aspiration and Bone Marrow Biopsy, Adult, Care After °This sheet gives you information about how to care for yourself after your procedure. Your health care provider may also give you more specific instructions. If you have problems or questions, contact your health care provider. °What can I expect after the procedure? °After the procedure, it is common to have: °· Mild pain and tenderness. °· Swelling. °· Bruising. °Follow these instructions at home: °Puncture site care ° °  ° °· Follow instructions from your health care provider about how to take care of the puncture site. Make sure you: °? Wash your hands with soap and water before you change your bandage (dressing). If soap and water are not available, use hand sanitizer. °? Change your dressing as told by your health care provider. °· Check your puncture site every day for signs of infection. Check for: °? More redness, swelling, or pain. °? More fluid or blood. °? Warmth. °? Pus or a bad smell. °General instructions °· Take over-the-counter and prescription medicines only as told by your health care provider. °· Do not take baths, swim, or use a hot tub until your health care provider approves. Ask if you can take a shower or have a sponge bath. °· Return to your normal activities as told by your health care provider. Ask your health care provider what activities are safe for you. °· Do not drive for 24 hours if you were given a medicine to help you relax (sedative) during your procedure. °· Keep all follow-up visits as told by your health care provider. This is important. °Contact a health care provider if: °· Your pain is not controlled with medicine. °Get help right away if: °· You have a fever. °· You have more redness, swelling, or pain around the puncture site. °· You have more fluid or blood coming from the puncture site. °· Your puncture site feels warm to the touch. °· You have pus or a bad smell coming from the puncture site. °These  symptoms may represent a serious problem that is an emergency. Do not wait to see if the symptoms will go away. Get medical help right away. Call your local emergency services (911 in the [place] Do not drive yourself to the hospital. °Summary °· After the procedure, it is common to have mild pain, tenderness, swelling, and bruising. °· Follow instructions from your health care provider about how to take care of the puncture site. °· Get help right away if you have any symptoms of infection or if you have more blood or fluid coming from the puncture site. °This information is not intended to replace advice given to you by your health care provider. Make sure you discuss any questions you have with your health care provider. °Document Released: 10/06/2004 Document Revised: 07/02/2017 Document Reviewed: 08/31/2015 °Elsevier Interactive Patient Education © 2019 [employer] ° ° ° °Moderate Conscious Sedation, Adult, Care After °These instructions provide you with information about caring for yourself after your procedure. Your health care provider may also give you more specific instructions. Your treatment has been planned according to current medical practices, but problems sometimes occur. Call your health care provider if you have any problems or questions after your procedure. °What can I expect after the procedure? °After your procedure, it is common: °· To feel sleepy for several hours. °· To feel clumsy and have poor balance for several hours. °· To have poor judgment for several hours. °· To vomit if you eat too soon. °  Follow these instructions at home: °For at least 24 hours after the procedure: ° °· Do not: °? Participate in activities where you could fall or become injured. °? Drive. °? Use heavy machinery. °? Drink alcohol. °? Take sleeping pills or medicines that cause drowsiness. °? Make important decisions or sign legal documents. °? Take care of children on your own. °· Rest. °Eating and  drinking °· Follow the diet recommended by your health care provider. °· If you vomit: °? Drink water, juice, or soup when you can drink without vomiting. °? Make sure you have little or no nausea before eating solid foods. °General instructions °· Have a responsible adult stay with you until you are awake and alert. °· Take over-the-counter and prescription medicines only as told by your health care provider. °· If you smoke, do not smoke without supervision. °· Keep all follow-up visits as told by your health care provider. This is important. °Contact a health care provider if: °· You keep feeling nauseous or you keep vomiting. °· You feel light-headed. °· You develop a rash. °· You have a fever. °Get help right away if: °· You have trouble breathing. °This information is not intended to replace advice given to you by your health care provider. Make sure you discuss any questions you have with your health care provider. °Document Released: 01/07/2013 Document Revised: 08/22/2015 Document Reviewed: 07/09/2015 °Elsevier Interactive Patient Education © 2019 [employer] ° °

## 2018-09-12 NOTE — Consult Note (Signed)
Chief Complaint: Patient was seen in consultation today for CT guided bone marrow biopsy  Referring Physician(s): Brunetta Genera  Supervising Physician: Aletta Edouard  Patient Status: [place]  History of Present Illness: Amber Drake is a 58 y.o. 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  Past Medical History:  Diagnosis Date  . Anemia   . Arthritis   . Blood dyscrasia    sickle cell trait  . Carbuncle and furuncle   . Hypercholesterolemia   . Hypertension    does not take meds  . Sickle cell trait [facility])     Past Surgical History:  Procedure Laterality Date  . APPENDECTOMY    . APPLICATION OF CRANIAL NAVIGATION N/A 07/28/2018   Procedure: APPLICATION OF CRANIAL NAVIGATION;  Surgeon: Kary Kos, MD;  Location: [facility];  Service: Neurosurgery;  Laterality: N/A;  . CARPAL TUNNEL RELEASE  03/23/2011   Procedure: CARPAL TUNNEL RELEASE;  Surgeon: Sanjuana Kava;  Location[facility];  Service: Orthopedics;  Laterality: Left;  . CARPAL TUNNEL RELEASE  05/10/2011   Procedure: CARPAL TUNNEL RELEASE;  Surgeon: Sanjuana Kava, MD;  Location: [facility];  Service: Orthopedics;  Laterality: Right;  . CESAREAN SECTION     x 2  . INCISION AND DRAINAGE PERIRECTAL ABSCESS  12/21/09  . PR DURAL GRAFT REPAIR,SPINE DEFECT N/A 07/28/2018   Procedure: Stereotactic open biopsy of Right cerebellar hemisphere and dura with brainlab;  Surgeon: Kary Kos, MD;  Location: [facility];  Service: Neurosurgery;  Laterality: N/A;  Stereotactic open biopsy of Right cerebellar hemisphere and dura with brainlab  . PROCTOSCOPY  10/17/2010   Procedure: PROCTOSCOPY;  Surgeon: Scherry Ran;  Location: [facility];  Service: General;  Laterality: N/A;  Rigid Proctoscopy/Possible Fistula in Ano  Procedure ended at 1003  . THERAPEUTIC ABORTION     x2     Allergies: Penicillins, Penicillins cross reactors, and Tape  Medications: Prior to Admission medications   Medication Sig Start Date End Date Taking? Authorizing Provider  albuterol (PROAIR HFA) 108 (90 Base) MCG/ACT inhaler Inhale 2 puffs into the lungs 4 (four) times daily as needed. 02/25/17  Yes [provider]  atorvastatin (LIPITOR) 20 MG tablet Take 1 tablet (20 mg total) by mouth daily. 03/14/15  Yes Soyla Dryer, PA-C  dexamethasone (DECADRON) 2 MG tablet Take 1 tablet (2 mg total) by mouth daily. 09/09/18  Yes Vaslow, Acey Lav, MD  ferrous sulfate 325 (65 FE) MG tablet Take 325 mg by mouth daily with breakfast.   Yes [provider]  lisinopril-hydrochlorothiazide (PRINZIDE,ZESTORETIC) 20-12.5 MG tablet Take 1 tablet by mouth daily.   Yes [provider]  Multiple Vitamin (MULTIVITAMIN WITH MINERALS) TABS tablet Take 1 tablet by mouth daily.   Yes [provider]  ranitidine (ZANTAC) 150 MG capsule Take 150 mg by mouth 2 (two) times daily.   Yes [provider]  cyclobenzaprine (FLEXERIL) 10 MG tablet Take 1 tablet (10 mg total) by mouth 3 (three) times daily as needed for muscle spasms. Patient not taking: Reported on 08/11/2018 11/07/16   Sanjuana Kava, MD  HYDROcodone-acetaminophen [facility]) 7.5-325 MG tablet Take 1 tablet by mouth every 8 (eight) hours as needed. 07/29/18   [provider]     Family History  Problem Relation Age of Onset  . Kidney failure Daughter   . Sickle cell anemia Daughter   . Sickle  cell trait Daughter   . Sickle cell anemia Son   . Hypertension Mother   . Hyperlipidemia Mother   . Hypertension Father   . Lupus Father        skin  . Hypertension Other   . Sarcoidosis Sister   . Anesthesia problems Neg Hx   . Hypotension Neg Hx   . Malignant hyperthermia Neg Hx   . Pseudochol deficiency Neg Hx     Social History   Socioeconomic History  . Marital status: Legally Separated    Spouse  name: Not on file  . Number of children: Not on file  . Years of education: Not on file  . Highest education level: Not on file  Occupational History  . Not on file  Social Needs  . Financial resource strain: Not on file  . Food insecurity    Worry: Not on file    Inability: Not on file  . Transportation needs    Medical: Not on file    Non-medical: Not on file  Tobacco Use  . Smoking status: Current Every Day Smoker    Packs/day: 0.50    Years: 30.00    Pack years: 15.00    Types: Cigarettes  . Smokeless tobacco: Never Used  Substance and Sexual Activity  . Alcohol use: No  . 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 Sexual activity: Yes    Partners: Male    Birth control/protection: None  Lifestyle  . Physical activity    Days per week: Not on file    Minutes per session: Not on file  . Stress: Not on file  Relationships  . Social Herbalist on phone: Not on file    Gets together: Not on file    Attends religious service: Not on file    Active member of club or organization: Not on file    Attends meetings of clubs or organizations: Not on file    Relationship status: Not on file  Other Topics Concern  . Not on file  Social History Narrative  . Not on file      Review of Systems denies fever, headache, chest pain, dyspnea, cough, abdominal/back pain, nausea, vomiting or bleeding.  Vital Signs: Blood pressure 125/83, heart rate 63, temperature 98.6, respirations 16, O2 sats 100% room air Wt 170 lb (77.1 kg)   LMP 08/12/2010   BMI 26.63 kg/m   Physical Exam awake, alert.  Chest clear to auscultation bilaterally.  Heart with regular rate and rhythm.  Abdomen soft, positive bowel sounds, nontender.  No lower extremity edema.  Imaging: Nm Pet Image Initial (pi) Skull Base To Thigh  Result Date: 09/08/2018 CLINICAL DATA:  Initial treatment strategy for diffuse large B-cell lymphoma. EXAM: NUCLEAR MEDICINE PET SKULL BASE TO THIGH TECHNIQUE:  8.4 mCi F-18 FDG was injected intravenously. Full-ring PET imaging was performed from the skull base to thigh after the radiotracer. CT data was obtained and used for attenuation correction and anatomic localization. Fasting blood glucose: 97 mg/dl COMPARISON:  CT [facility] 12/10/2017. FINDINGS: Mediastinal blood pool activity: SUV max 2.4 Liver activity: SUV max 3.2 NECK: Postoperative changes from right occipital craniectomy tumor. Encephalomalacia in the right cerebellar hemisphere with corresponding photopenic defect noted. No hypermetabolic lymph nodes within the soft tissues of the neck. Incidental CT findings: none CHEST: No hypermetabolic axillary or supraclavicular lymph nodes. No hypermetabolic mediastinal or hilar lymph nodes. No FDG avid pulmonary nodule or mass.  Incidental CT findings: Mild changes of centrilobular emphysema. Aortic atherosclerosis. ABDOMEN/PELVIS: No abnormal uptake within the liver, pancreas, spleen, or adrenal glands. Normal size spleen. No enlarged or hypermetabolic lymph nodes within the abdomen or pelvis. Incidental CT findings: Aortic atherosclerosis.  No aneurysm. SKELETON: No focal hypermetabolic activity to suggest skeletal metastasis. Extending from T12-L1 there is a focal area of increased radiotracer uptake within the cord measuring 3 cm with SUV max of 2.8. Nonspecific. Incidental CT findings: none IMPRESSION: 1. No hypermetabolic mass or adenopathy identified within the chest abdomen or pelvis to suggest metabolically active tumor. 2. Nonspecific focus of increased uptake within the cord extending from T12 to L1. Cannot rule out additional site of CNS lymphoma. Consider further evaluation with contrast enhanced MRI through this area. 3. Aortic Atherosclerosis (ICD10-I70.0) and Emphysema (ICD10-J43.9). Electronically Signed   By: Kerby Moors M.D.   On: 09/08/2018 09:51    Labs:  CBC: Recent Labs    07/21/18 1640 07/28/18 1339 08/13/18 1133 09/12/18 0930  WBC 7.1  14.2* 10.1 11.2*  HGB 11.1* 11.1* 12.1 11.8*  HCT 34.0* 32.3* 35.3* 35.2*  PLT 372 374 390 346    COAGS: Recent Labs    09/12/18 0930  INR 0.8    BMP: Recent Labs    07/21/18 1640 07/28/18 1339 07/29/18 0837 08/13/18 1133  NA 140 140 139 135  K 3.4* 3.5 3.7 3.6  CL 102 103 108 97*  CO2 _0 GLUCOSE 97 152* 142* 121*  BUN _1 23*  CALCIUM 10.0 9.7 9.2 9.5  CREATININE 0.74 0.89 0.82 0.92  GFRNONAA >60 >60 >60 >60  GFRAA >60 >60 >60 >60    LIVER FUNCTION TESTS: Recent Labs    12/10/17 0823 08/13/18 1133  BILITOT 0.3 0.4  AST 23 15  ALT 15 24  ALKPHOS 48 55  PROT 7.8 7.2  ALBUMIN 3.9 3.6    TUMOR MARKERS: No results for input(s): AFPTM, CEA, CA199, CHROMGRNA in the last 8760 hours.  Assessment and Plan: 58 y.o. 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  All of the questions were answered and there is agreement to proceed.  Consent signed and in chart.     Thank you for this interesting consult.  I greatly enjoyed meeting Arianie Couse and look forward to participating in their care.  A copy of this report was sent to the requesting provider on this date.  Electronically Signed: D. Rowe Robert, PA-C 09/12/2018, 10:20 AM   I spent a total of 20 minutes    in face to face in clinical consultation, greater than 50% of which was counseling/coordinating care for CT-guided bone marrow biopsy

## 2018-09-23 DIAGNOSIS — E785 Hyperlipidemia, unspecified: Secondary | ICD-10-CM | POA: Diagnosis not present

## 2018-09-23 DIAGNOSIS — L0292 Furuncle, unspecified: Secondary | ICD-10-CM | POA: Diagnosis not present

## 2018-09-23 DIAGNOSIS — I10 Essential (primary) hypertension: Secondary | ICD-10-CM | POA: Diagnosis not present

## 2018-09-26 ENCOUNTER — Telehealth: Payer: Self-pay | Admitting: *Deleted

## 2018-09-26 NOTE — Telephone Encounter (Signed)
Patient called, asked if she needed an appt. Per Dr.Kale's earlier note, she needed an appt once she had PET and biopsy. She completed PET on 6/8 and biopsy on 6/12. Informed her that message will be sent to scheduling. She verbalized understanding.

## 2018-09-29 ENCOUNTER — Telehealth: Payer: Self-pay | Admitting: Hematology

## 2018-09-29 NOTE — Telephone Encounter (Signed)
Scheduled appt per 6/26 sch message - pt aware of appt date and time   

## 2018-10-01 ENCOUNTER — Ambulatory Visit (INDEPENDENT_AMBULATORY_CARE_PROVIDER_SITE_OTHER): Payer: BC Managed Care – PPO | Admitting: Urology

## 2018-10-01 DIAGNOSIS — R311 Benign essential microscopic hematuria: Secondary | ICD-10-CM

## 2018-10-06 NOTE — Progress Notes (Signed)
HEMATOLOGY/ONCOLOGY CLINIC NOTE  Date of Service: 10/07/2018  Patient Care Team: Amber Fire, MD as PCP - General (Internal Medicine)  CHIEF COMPLAINTS/PURPOSE OF CONSULTATION:  Newly Diagnosed Extranodal Marginal Zone Lymphoma  HISTORY OF PRESENTING ILLNESS:   Amber Drake is a wonderful 58 y.o. female who has been referred to [place] by my colleague Dr. Cecil Drake in Neuro-Oncology for evaluation and management of her Newly diagnosed Extranodal Marginal Zone Lymphoma. She is accompanied today by her sister Amber Drake via [employer]. The pt reports that she is doing well overall.  Prior to today's visit, the pt presented to the ED on 07/21/18 with complaints of a headache for the past month which presented intermittently and was worsened by bending, coughing, or laughing. She had a CT Head and MRI Brain which revealed extensive dural thickening, as noted below. She then had a right posterior fossa craniectomy on 07/28/18 for open exicisional biopsies of the right cerebellar hemisphere and dura which revealed Extranodal marginal zone lymphoma. The pt then established care with my colleague Dr. Cecil Drake in Neuro-Onc on 08/11/18. She has been taking 47m Decadron daily.  The pt reports that developed worsened headaches about a month ago, and notes that when she laughed it felt like "electiricty" was running through the sides and back of her head. She notes that some mild headaches had begun a couple months ago. Denies double vision, changes in vision, loss of balance, or other senses being affected. The pt notes that in addition to this, she developed redness in the eyes in the past couple months, saw eye doctor and was prescribed eye drops. Denies other concerns in the last 6 months including bone pains, fatigue, fevers, chills, night sweats or unexpected weight loss. She notes that her headaches have resolved. She notes she began Decadron after her biopsy. She denies having "any symptoms  whatsoever," currently.   Of note prior to the patient's visit today, pt has had an MRI Brain completed on 07/21/18 with results revealing "Extensive dural thickening surrounding the right cerebellar hemisphere with mass-effect and edema in the right cerebellum. The process appears to extend into the upper cervical canal. Mild obstructive hydrocephalus. Differential includes tumor including metastatic disease and lymphoma. Chronic inflammatory process such as sarcoid is a consideration however chest x-ray negative. TB and atypical infection/fungus also possible. 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  On review of systems, pt reports good energy levels, headache resolution, and denies joint issues, skin rashes, dry mouth, dry eyes, fevers, chills, night sweats, bone pains, unexpected weight loss, fatigue, double vision, loss of vision, affected senses, loss of balance, and any other symptoms.   On PMHx the pt reports sickle cel trait, HTN, hyperlipidemia. On Social Hx the pt reports that she smoked 1 ppd for the last 42 years, and reports having quit two days ago. 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 On Family Hx the pt reports dad with lupus and sister with sarcoidosis. Daughter and son with sickle cell disease. Denies other blood disorders or cancers. Endorses Penicillin allergy  Interval History:   GKarlisha Mathenareturns today for management and evaluation of her Extranodal Marginal Zone Lymphoma. The patient's  last visit with [place] was on 08/13/18. The pt reports that she is doing well overall.  The pt reports that she has not developed any new concerns in the interim. She denies headaches or changes in balance or falls. She  also denies any back pains. She denies fevers, chills, or night sweats. She denies weakness in the legs, loss of strength, bladder or bowel abnormalities. She has continued on Decadron as per Dr. Renda Rolls recommendations and endorses eating well and mild weight gain. She denies leg swelling.  The pt denies lung, kidney or liver problems. She stopped consuming alcohol 13 years ago.   She has been using eye drops for dry, red eyes recently.  Of note since the patient's last visit, pt has had a PET/CT completed on 09/08/18 with results revealing "No hypermetabolic mass or adenopathy identified within the chest abdomen or pelvis to suggest metabolically active tumor. 2. Nonspecific focus of increased uptake within the cord extending from T12 to L1. Cannot rule out additional site of CNS lymphoma. Consider further evaluation with contrast enhanced MRI through this area. 3. Aortic Atherosclerosis and Emphysema."  Lab results today (10/07/18) of CBC w/diff and CMP is as follows: all values are WNL except for HGB at 11.7, HCT at 35.1, PLT at 451k, Potassium at 2.7, Glucose at 128, Total Bilirubin at 0.2. 10/07/18 LDH at 200  On review of systems, pt reports stable energy levels, eating well, mild weight gain, and denies fevers, chills, night sweats, back pains, headaches, loss of balance, falls, pain along the neck, and any other symptoms.   MEDICAL HISTORY:  Past Medical History:  Diagnosis Date   Anemia    Arthritis    Blood dyscrasia    sickle cell trait   Carbuncle and furuncle    Hypercholesterolemia    Hypertension    does not take meds   Sickle cell trait ([place])     SURGICAL HISTORY: Past Surgical History:  Procedure Laterality Date   APPENDECTOMY     APPLICATION OF CRANIAL NAVIGATION N/A 07/28/2018   Procedure: APPLICATION OF CRANIAL NAVIGATION;  Surgeon: Kary Kos, MD;  Location: [place];  Service: Neurosurgery;  Laterality: N/A;   CARPAL TUNNEL RELEASE  03/23/2011   Procedure:  CARPAL TUNNEL RELEASE;  Surgeon: Sanjuana Kava;  Location: [facility];  Service: Orthopedics;  Laterality: Left;   CARPAL TUNNEL RELEASE  05/10/2011   Procedure: CARPAL TUNNEL RELEASE;  Surgeon: Sanjuana Kava, MD;  Location: [facility];  Service: Orthopedics;  Laterality: Right;   CESAREAN SECTION     x 2   INCISION AND DRAINAGE PERIRECTAL ABSCESS  12/21/09   PR DURAL GRAFT REPAIR,SPINE DEFECT N/A 07/28/2018   Procedure: Stereotactic open biopsy of Right cerebellar hemisphere and dura with brainlab;  Surgeon: Kary Kos, MD;  Location: [place];  Service: Neurosurgery;  Laterality: N/A;  Stereotactic open biopsy of Right cerebellar hemisphere and dura with brainlab   PROCTOSCOPY  10/17/2010   Procedure: PROCTOSCOPY;  Surgeon: Scherry Ran;  Location: [facility];  Service: General;  Laterality: N/A;  Rigid Proctoscopy/Possible Fistula in Ano  Procedure ended at [place]     x2    SOCIAL HISTORY: Social History   Socioeconomic History   Marital status: Legally Separated    Spouse name: Not on file   Number of children: Not on file   Years of education: Not on file   Highest education level: Not on file  Occupational History   Not on file  Social  Needs   Financial resource strain: Not on file   Food insecurity    Worry: Not on file    Inability: Not on file   Transportation needs    Medical: Not on file    Non-medical: Not on file  Tobacco Use   Smoking status: Current Every Day Smoker    Packs/day: 0.50    Years: 30.00    Pack years: 15.00    Types: Cigarettes   Smokeless tobacco: Never Used  Substance and Sexual Activity   Alcohol use: No   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   Sexual activity: Yes    Partners: Male    Birth control/protection: None  Lifestyle   Physical activity    Days per week: Not on file    Minutes per session: Not on file   Stress: Not on file  Relationships   Social connections    Talks on phone: Not  on file    Gets together: Not on file    Attends religious service: Not on file    Active member of club or organization: Not on file    Attends meetings of clubs or organizations: Not on file    Relationship status: Not on file   Intimate partner violence    Fear of current or ex partner: Not on file    Emotionally abused: Not on file    Physically abused: Not on file    Forced sexual activity: Not on file  Other Topics Concern   Not on file  Social History Narrative   Not on file    FAMILY HISTORY: Family History  Problem Relation Age of Onset   Kidney failure Daughter    Sickle cell anemia Daughter    Sickle cell trait Daughter    Sickle cell anemia Son    Hypertension Mother    Hyperlipidemia Mother    Hypertension Father    Lupus Father        skin   Hypertension Other    Sarcoidosis Sister    Anesthesia problems Neg Hx    Hypotension Neg Hx    Malignant hyperthermia Neg Hx    Pseudochol deficiency Neg Hx     ALLERGIES:  is allergic to penicillins; penicillins cross reactors; and tape.  MEDICATIONS:  Current Outpatient Medications  Medication Sig Dispense Refill   albuterol (PROAIR HFA) 108 (90 Base) MCG/ACT inhaler Inhale 2 puffs into the lungs 4 (four) times daily as needed.     atorvastatin (LIPITOR) 20 MG tablet Take 1 tablet (20 mg total) by mouth daily. 90 tablet 2   cyclobenzaprine (FLEXERIL) 10 MG tablet Take 1 tablet (10 mg total) by mouth 3 (three) times daily as needed for muscle spasms. (Patient not taking: Reported on 08/11/2018) 40 tablet 0   dexamethasone (DECADRON) 2 MG tablet Take 1 tablet (2 mg total) by mouth daily. 30 tablet 0   ferrous sulfate 325 (65 FE) MG tablet Take 325 mg by mouth daily with breakfast.     HYDROcodone-acetaminophen (NORCO) 7.5-325 MG tablet Take 1 tablet by mouth every 8 (eight) hours as needed.     lisinopril-hydrochlorothiazide (PRINZIDE,ZESTORETIC) 20-12.5 MG tablet Take 1 tablet by mouth daily.      Multiple Vitamin (MULTIVITAMIN WITH MINERALS) TABS tablet Take 1 tablet by mouth daily.     ranitidine (ZANTAC) 150 MG capsule Take 150 mg by mouth 2 (two) times daily.     No current facility-administered medications for this  visit.     REVIEW OF SYSTEMS:    A 10+ POINT REVIEW OF SYSTEMS WAS OBTAINED including neurology, dermatology, psychiatry, cardiac, respiratory, lymph, extremities, GI, GU, Musculoskeletal, constitutional, breasts, reproductive, HEENT.  All pertinent positives are noted in the HPI.  All others are negative.   PHYSICAL EXAMINATION: ECOG PERFORMANCE STATUS: 1 - Symptomatic but completely ambulatory  . Vitals:   10/07/18 1446  BP: (!) 135/92  Pulse: 87  Resp: 18  Temp: 98.7 F (37.1 C)  SpO2: 100%   Filed Weights   10/07/18 1446  Weight: 170 lb 6.4 oz (77.3 kg)   .Body mass index is 26.69 kg/m.  GENERAL:alert, in no acute distress and comfortable SKIN: no acute rashes, no significant lesions EYES: conjunctiva are pink and non-injected, sclera anicteric OROPHARYNX: MMM, no exudates, no oropharyngeal erythema or ulceration NECK: supple, no JVD LYMPH:  no palpable lymphadenopathy in the cervical, axillary or inguinal regions LUNGS: clear to auscultation b/l with normal respiratory effort HEART: regular rate & rhythm ABDOMEN:  normoactive bowel sounds , non tender, not distended. No palpable hepatosplenomegaly.  Extremity: no pedal edema PSYCH: alert & oriented x 3 with fluent speech NEURO: no focal motor/sensory deficits   LABORATORY DATA:  I have reviewed the data as listed  . CBC Latest Ref Rng & Units 10/07/2018 09/12/2018 08/13/2018  WBC 4.0 - 10.5 K/uL 7.1 11.2(H) 10.1  Hemoglobin 12.0 - 15.0 g/dL 11.7(L) 11.8(L) 12.1  Hematocrit 36.0 - 46.0 % 35.1(L) 35.2(L) 35.3(L)  Platelets 150 - 400 K/uL 451(H) 346 390    . CMP Latest Ref Rng & Units 10/07/2018 08/13/2018 07/29/2018  Glucose 70 - 99 mg/dL 128(H) 121(H) 142(H)  BUN 6 - 20 mg/dL 7 23(H)  13  Creatinine 0.44 - 1.00 mg/dL 0.81 0.92 0.82  Sodium 135 - 145 mmol/L 142 135 139  Potassium 3.5 - 5.1 mmol/L 2.7(LL) 3.6 3.7  Chloride 98 - 111 mmol/L 103 97(L) 108  CO2 22 - 32 mmol/L _0 Calcium 8.9 - 10.3 mg/dL 9.8 9.5 9.2  Total Protein 6.5 - 8.1 g/dL 7.4 7.2 -  Total Bilirubin 0.3 - 1.2 mg/dL 0.2(L) 0.4 -  Alkaline Phos 38 - 126 U/L 67 55 -  AST 15 - 41 U/L 18 15 -  ALT 0 - 44 U/L 12 24 -    09/12/18 BM Biopsy:    07/28/18 Right cerebellar hemisphere and dura biopsy:     RADIOGRAPHIC STUDIES: I have personally reviewed the radiological images as listed and agreed with the findings in the report. 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 EXAM: NUCLEAR MEDICINE PET SKULL BASE TO THIGH TECHNIQUE: 8.4 mCi F-18 FDG was injected intravenously. Full-ring PET imaging was performed from the skull base to thigh after the radiotracer. CT data was obtained and used for attenuation correction and anatomic localization. Fasting blood glucose: 97 mg/dl COMPARISON:  [facility] 12/10/2017. FINDINGS: Mediastinal blood pool activity: SUV max 2.4 Liver activity: SUV max 3.2 NECK: Postoperative changes from right occipital craniectomy tumor. Encephalomalacia in the right cerebellar hemisphere with corresponding photopenic defect noted. No hypermetabolic lymph nodes within the soft tissues of the neck. Incidental CT findings: none CHEST: No hypermetabolic axillary or supraclavicular lymph nodes. No hypermetabolic mediastinal or hilar lymph nodes. No FDG avid pulmonary nodule or mass. Incidental CT findings: Mild changes of centrilobular emphysema. Aortic atherosclerosis. ABDOMEN/PELVIS: No abnormal uptake within the liver, pancreas,  spleen, or adrenal glands. Normal size spleen. No enlarged or hypermetabolic lymph nodes within the abdomen or pelvis. Incidental CT findings: Aortic atherosclerosis.   No aneurysm. SKELETON: No focal hypermetabolic activity to suggest skeletal metastasis. Extending from T12-L1 there is a focal area of increased radiotracer uptake within the cord measuring 3 cm with SUV max of 2.8. Nonspecific. Incidental CT findings: none IMPRESSION: 1. No hypermetabolic mass or adenopathy identified within the chest abdomen or pelvis to suggest metabolically active tumor. 2. Nonspecific focus of increased uptake within the cord extending from T12 to L1. Cannot rule out additional site of CNS lymphoma. Consider further evaluation with contrast enhanced MRI through this area. 3. Aortic Atherosclerosis (ICD10-I70.0) and Emphysema (ICD10-J43.9). Electronically Signed   By: Kerby Moors M.D.   On: 09/08/2018 09:51   Ct Biopsy  Result Date: 09/12/2018 CLINICAL DATA:  Marginal zone lymphoma of the meninges of the posterior fossa. Bone marrow biopsy required for further staging. EXAM: CT GUIDED BONE MARROW ASPIRATION AND BIOPSY ANESTHESIA/SEDATION: Versed 4.0 mg IV, Fentanyl 100 mcg IV Total Moderate Sedation Time:   19 minutes. The patient's level of consciousness and physiologic status were continuously monitored during the procedure by Radiology nursing. PROCEDURE: The procedure risks, benefits, and alternatives were explained to the patient. Questions regarding the procedure were encouraged and answered. The patient understands and consents to the procedure. A time out was performed prior to initiating the procedure. The right gluteal region was prepped with chlorhexidine. Sterile gown and sterile gloves were used for the procedure. Local anesthesia was provided with 1% Lidocaine. Under CT guidance, an 11 gauge On Control bone cutting needle was advanced from a posterior approach into the right iliac bone. Needle positioning was confirmed with CT. Initial non heparinized and heparinized aspirate samples were obtained of bone marrow. Core biopsy was performed via the On Control drill needle.  COMPLICATIONS: None FINDINGS: Inspection of initial aspirate did reveal visible particles. Intact core biopsy sample was obtained. IMPRESSION: CT guided bone marrow biopsy of right posterior iliac bone with both aspirate and core samples obtained. Electronically Signed   By: Aletta Edouard M.D.   On: 09/12/2018 12:26   Ct Bone Marrow Biopsy & Aspiration  Result Date: 09/12/2018 CLINICAL DATA:  Marginal zone lymphoma of the meninges of the posterior fossa. Bone marrow biopsy required for further staging. EXAM: CT GUIDED BONE MARROW ASPIRATION AND BIOPSY ANESTHESIA/SEDATION: Versed 4.0 mg IV, Fentanyl 100 mcg IV Total Moderate Sedation Time:   19 minutes. The patient's level of consciousness and physiologic status were continuously monitored during the procedure by Radiology nursing. PROCEDURE: The procedure risks, benefits, and alternatives were explained to the patient. Questions regarding the procedure were encouraged and answered. The patient understands and consents to the procedure. A time out was performed prior to initiating the procedure. The right gluteal region was prepped with chlorhexidine. Sterile gown and sterile gloves were used for the procedure. Local anesthesia was provided with 1% Lidocaine. Under CT guidance, an 11 gauge On Control bone cutting needle was advanced from a posterior approach into the right iliac bone. Needle positioning was confirmed with CT. Initial non heparinized and heparinized aspirate samples were obtained of bone marrow. Core biopsy was performed via the On Control drill needle. COMPLICATIONS: None FINDINGS: Inspection of initial aspirate did reveal visible particles. Intact core biopsy sample was obtained. IMPRESSION: CT guided bone marrow biopsy of right posterior iliac bone with both aspirate and core samples obtained. Electronically Signed   By: Jenness Corner.D.  On: 09/12/2018 12:26    ASSESSMENT & PLAN:  58 y.o. female with  1. Newly Diagnosed Extranodal  Marginal Zone Lymphoma involving meninges in posterior fossa Labs upon initial presentation from 07/28/18, HGB stable at 11.1, WBC higher at 14.2k, PLT normal and stable at 374k 11/18/17 HIV Antibody non-reactive 07/28/18 Right cerebellar hemisphere and dura biopsy which revealed Extranodal Marginal Zone Lymphoma 07/21/18 MRI Brain revealed "Extensive dural thickening surrounding the right cerebellar hemisphere with mass-effect and edema in the right cerebellum. The process appears to extend into the upper cervical canal. Mild obstructive hydrocephalus. Differential includes tumor including metastatic disease and lymphoma. Chronic inflammatory process such as sarcoid is a consideration however chest x-ray negative. TB and atypical infection/fungus also possible. 6 mm colloid cyst felt to be a separate problem." 07/25/18 CT C/A/P which did not reveal significant abnormality  PLAN -Discussed pt labwork today, 10/07/18; Potassium low at 2.7. Other blood counts are stable. -Begin 97mq Potassium BID for 5 days, then once a day and rechecking with PCP in two week -Discussed the 08/13/18 IgG Subclass 4 was normal at 39 reassuring against IgG4 diseases given association between meningeal extranodal marginal zone lymphoma -Discussed the 09/12/18 BM Bx which revealed no evidence of lymphoma -Discussed the 09/08/18 PET/CT which revealed "No hypermetabolic mass or adenopathy identified within the chest abdomen or pelvis to suggest metabolically active tumor. 2. Nonspecific focus of increased uptake within the cord extending from T12 to L1. Cannot rule out additional site of CNS lymphoma. Consider further evaluation with contrast enhanced MRI through this area. 3. Aortic Atherosclerosis and Emphysema." -Discussed that there is a higher probability of this kind of cancer returning in the future after treatment, but that this would likely still be treatable. -Discussed my concern for involvement in the lining of the spinal  cord and lining of the brain -Discussed possible less aggressive treatment option of low dose intrathecal Methotrexate and Rituxan up to 8 doses vs more aggressive option of IV Methotrexate and Rituxan. Would need leucovorin in second option. Discussed possible side effects of liver or kidney problems and cytopenias. -Pt prefers the more aggressive option of IV Methotrexate and Rituxan. Will plan to treat for up to 3-4 cycles. Will tentatively begin in 3 weeks -Continue Decadron daily per Dr. VMickeal Skinner-Will discuss this further with my tumor board -Will order lumbar puncture -Will order MRI Spine -Will order repeat MRI Brain -Will set pt up for chemotherapy counseling -Will see the pt back in 2 weeks   MRI brain, MRI C/T and L spine in 1 week CT lumbar puncture in 5-7 days Inpatient admission for High dose Methotrexate in 3 weeks Outpatient Rituxan in 2 weeks with labs   All of the patients questions were answered with apparent satisfaction. The patient knows to call the clinic with any problems, questions or concerns.  The total time spent in the appt was 40 minutes and more than 50% was on counseling and direct patient cares.    GSullivan LoneMD MS AAHIVMS SBaylor [facility] PlanoCSurgery Center Of LawrencevilleHematology/Oncology Physician [facility] (Office):       [phone](Work cell):  [phone](Fax):           [phone] 10/07/2018 3:32 PM  I, SBaldwin Jamaica am acting as a scribe for Dr. GSullivan Lone   .I have reviewed the above documentation for accuracy and completeness, and I agree with the above. .Brunetta GeneraMD

## 2018-10-07 ENCOUNTER — Other Ambulatory Visit: Payer: Self-pay | Admitting: *Deleted

## 2018-10-07 ENCOUNTER — Inpatient Hospital Stay: Payer: BC Managed Care – PPO

## 2018-10-07 ENCOUNTER — Other Ambulatory Visit: Payer: Self-pay

## 2018-10-07 ENCOUNTER — Inpatient Hospital Stay: Payer: BC Managed Care – PPO | Attending: Internal Medicine | Admitting: Hematology

## 2018-10-07 VITALS — BP 135/92 | HR 87 | Temp 98.7°F | Resp 18 | Ht 67.0 in | Wt 170.4 lb

## 2018-10-07 DIAGNOSIS — F1411 Cocaine abuse, in remission: Secondary | ICD-10-CM | POA: Insufficient documentation

## 2018-10-07 DIAGNOSIS — D573 Sickle-cell trait: Secondary | ICD-10-CM | POA: Diagnosis not present

## 2018-10-07 DIAGNOSIS — F17210 Nicotine dependence, cigarettes, uncomplicated: Secondary | ICD-10-CM | POA: Insufficient documentation

## 2018-10-07 DIAGNOSIS — J439 Emphysema, unspecified: Secondary | ICD-10-CM | POA: Diagnosis not present

## 2018-10-07 DIAGNOSIS — M1990 Unspecified osteoarthritis, unspecified site: Secondary | ICD-10-CM | POA: Diagnosis not present

## 2018-10-07 DIAGNOSIS — Z880 Allergy status to penicillin: Secondary | ICD-10-CM | POA: Insufficient documentation

## 2018-10-07 DIAGNOSIS — C8331 Diffuse large B-cell lymphoma, lymph nodes of head, face, and neck: Secondary | ICD-10-CM

## 2018-10-07 DIAGNOSIS — I10 Essential (primary) hypertension: Secondary | ICD-10-CM | POA: Diagnosis not present

## 2018-10-07 DIAGNOSIS — Z79899 Other long term (current) drug therapy: Secondary | ICD-10-CM | POA: Insufficient documentation

## 2018-10-07 DIAGNOSIS — C884 Extranodal marginal zone B-cell lymphoma of mucosa-associated lymphoid tissue [MALT-lymphoma]: Secondary | ICD-10-CM | POA: Insufficient documentation

## 2018-10-07 DIAGNOSIS — E876 Hypokalemia: Secondary | ICD-10-CM | POA: Diagnosis not present

## 2018-10-07 DIAGNOSIS — I700 Atherosclerosis of aorta: Secondary | ICD-10-CM | POA: Insufficient documentation

## 2018-10-07 DIAGNOSIS — E785 Hyperlipidemia, unspecified: Secondary | ICD-10-CM | POA: Diagnosis not present

## 2018-10-07 LAB — CBC WITH DIFFERENTIAL (CANCER CENTER ONLY)
Abs Immature Granulocytes: 0.02 10*3/uL (ref 0.00–0.07)
Basophils Absolute: 0 10*3/uL (ref 0.0–0.1)
Basophils Relative: 0 %
Eosinophils Absolute: 0 10*3/uL (ref 0.0–0.5)
Eosinophils Relative: 0 %
HCT: 35.1 % — ABNORMAL LOW (ref 36.0–46.0)
Hemoglobin: 11.7 g/dL — ABNORMAL LOW (ref 12.0–15.0)
Immature Granulocytes: 0 %
Lymphocytes Relative: 34 %
Lymphs Abs: 2.4 10*3/uL (ref 0.7–4.0)
MCH: 29.5 pg (ref 26.0–34.0)
MCHC: 33.3 g/dL (ref 30.0–36.0)
MCV: 88.4 fL (ref 80.0–100.0)
Monocytes Absolute: 0.4 10*3/uL (ref 0.1–1.0)
Monocytes Relative: 6 %
Neutro Abs: 4.2 10*3/uL (ref 1.7–7.7)
Neutrophils Relative %: 60 %
Platelet Count: 451 10*3/uL — ABNORMAL HIGH (ref 150–400)
RBC: 3.97 MIL/uL (ref 3.87–5.11)
RDW: 13.2 % (ref 11.5–15.5)
WBC Count: 7.1 10*3/uL (ref 4.0–10.5)
nRBC: 0 % (ref 0.0–0.2)

## 2018-10-07 LAB — CMP (CANCER CENTER ONLY)
ALT: 12 U/L (ref 0–44)
AST: 18 U/L (ref 15–41)
Albumin: 3.6 g/dL (ref 3.5–5.0)
Alkaline Phosphatase: 67 U/L (ref 38–126)
Anion gap: 12 (ref 5–15)
BUN: 7 mg/dL (ref 6–20)
CO2: 27 mmol/L (ref 22–32)
Calcium: 9.8 mg/dL (ref 8.9–10.3)
Chloride: 103 mmol/L (ref 98–111)
Creatinine: 0.81 mg/dL (ref 0.44–1.00)
GFR, Est AFR Am: >60 mL/min (ref >60)
GFR, Estimated: >60 mL/min (ref >60)
Glucose, Bld: 128 mg/dL — ABNORMAL HIGH (ref 70–99)
Potassium: 2.7 mmol/L — CL (ref 3.5–5.1)
Sodium: 142 mmol/L (ref 135–145)
Total Bilirubin: 0.2 mg/dL — ABNORMAL LOW (ref 0.3–1.2)
Total Protein: 7.4 g/dL (ref 6.5–8.1)

## 2018-10-07 LAB — LACTATE DEHYDROGENASE: LDH: 200 U/L — ABNORMAL HIGH (ref 98–192)

## 2018-10-07 MED ORDER — POTASSIUM CHLORIDE CRYS ER 20 MEQ PO TBCR: EXTENDED_RELEASE_TABLET | ORAL | 0 refills | Status: DC

## 2018-10-08 ENCOUNTER — Telehealth: Payer: Self-pay | Admitting: Hematology

## 2018-10-08 NOTE — Telephone Encounter (Signed)
Scheduled appt per 7/7 los. Spoke with patient and she is aware of her appt date and time.

## 2018-10-11 ENCOUNTER — Ambulatory Visit (HOSPITAL_COMMUNITY): Payer: BC Managed Care – PPO

## 2018-10-14 ENCOUNTER — Ambulatory Visit (HOSPITAL_COMMUNITY): Admission: RE | Admit: 2018-10-14 | Payer: BC Managed Care – PPO | Source: Ambulatory Visit

## 2018-10-15 ENCOUNTER — Telehealth: Payer: Self-pay | Admitting: *Deleted

## 2018-10-15 ENCOUNTER — Inpatient Hospital Stay: Payer: BC Managed Care – PPO

## 2018-10-16 ENCOUNTER — Ambulatory Visit (HOSPITAL_COMMUNITY): Admission: RE | Admit: 2018-10-16 | Payer: BC Managed Care – PPO | Source: Ambulatory Visit

## 2018-10-16 ENCOUNTER — Ambulatory Visit (HOSPITAL_COMMUNITY)
Admission: RE | Admit: 2018-10-16 | Discharge: 2018-10-16 | Disposition: A | Discharge status: Home / Self Care | Payer: BC Managed Care – PPO | Source: Ambulatory Visit | Attending: Hematology | Admitting: Hematology

## 2018-10-16 ENCOUNTER — Other Ambulatory Visit: Payer: Self-pay

## 2018-10-16 DIAGNOSIS — Q046 Congenital cerebral cysts: Secondary | ICD-10-CM | POA: Diagnosis not present

## 2018-10-16 DIAGNOSIS — C8339 Diffuse large B-cell lymphoma, extranodal and solid organ sites: Secondary | ICD-10-CM | POA: Diagnosis not present

## 2018-10-16 DIAGNOSIS — C884 Extranodal marginal zone B-cell lymphoma of mucosa-associated lymphoid tissue [MALT-lymphoma]: Secondary | ICD-10-CM | POA: Diagnosis not present

## 2018-10-16 MED ORDER — GADOBUTROL 1 MMOL/ML IV SOLN
7.0000 mL | Freq: Once | INTRAVENOUS | Status: AC | PRN
  Administered 2018-10-16: 7 mL via INTRAVENOUS

## 2018-10-20 ENCOUNTER — Ambulatory Visit (HOSPITAL_COMMUNITY)
Admission: RE | Admit: 2018-10-20 | Discharge: 2018-10-20 | Disposition: A | Discharge status: Home / Self Care | Payer: BC Managed Care – PPO | Source: Ambulatory Visit | Attending: Hematology | Admitting: Hematology

## 2018-10-20 ENCOUNTER — Telehealth: Payer: Self-pay | Admitting: *Deleted

## 2018-10-20 ENCOUNTER — Other Ambulatory Visit: Payer: Self-pay

## 2018-10-20 DIAGNOSIS — C884 Extranodal marginal zone B-cell lymphoma of mucosa-associated lymphoid tissue [MALT-lymphoma]: Secondary | ICD-10-CM

## 2018-10-20 DIAGNOSIS — M4802 Spinal stenosis, cervical region: Secondary | ICD-10-CM | POA: Diagnosis not present

## 2018-10-20 DIAGNOSIS — M50222 Other cervical disc displacement at C5-C6 level: Secondary | ICD-10-CM | POA: Diagnosis not present

## 2018-10-20 DIAGNOSIS — Z8572 Personal history of non-Hodgkin lymphomas: Secondary | ICD-10-CM | POA: Diagnosis not present

## 2018-10-20 MED ORDER — GADOBUTROL 1 MMOL/ML IV SOLN
8.0000 mL | Freq: Once | INTRAVENOUS | Status: AC | PRN
  Administered 2018-10-20: 8 mL via INTRAVENOUS

## 2018-10-20 NOTE — Telephone Encounter (Signed)
Patient unable to complete MRI today due to claustrophobia. She stated the radiology tech recommended she have it at the other hospital where they have a more open machine. She said she tried, but she just couldn't do it. Dr.Kale will be informed.

## 2018-10-21 ENCOUNTER — Inpatient Hospital Stay: Payer: BC Managed Care – PPO | Admitting: Hematology

## 2018-10-21 ENCOUNTER — Inpatient Hospital Stay: Payer: BC Managed Care – PPO

## 2018-10-21 ENCOUNTER — Telehealth: Payer: Self-pay | Admitting: *Deleted

## 2018-10-21 NOTE — Telephone Encounter (Signed)
Patient called - she was not able to tolerate completing MRI due to claustrophobia. Cervical spine completed, lumbar and thoracic not tolerated. Per Dr. Irene Limbo, cancel appts on 7/21 for lab/Dr.Kale/Infusion. [facility] as [facility] by Rad tech. Reschedule appts for patient once MRI completed.  Contacted patient with this information. Patient verbalized understanding.

## 2018-10-22 ENCOUNTER — Ambulatory Visit (HOSPITAL_COMMUNITY)
Admission: RE | Admit: 2018-10-22 | Discharge: 2018-10-22 | Disposition: A | Discharge status: Home / Self Care | Payer: BC Managed Care – PPO | Source: Ambulatory Visit | Attending: Hematology | Admitting: Hematology

## 2018-10-22 ENCOUNTER — Telehealth: Payer: Self-pay | Admitting: *Deleted

## 2018-10-22 ENCOUNTER — Other Ambulatory Visit: Payer: Self-pay

## 2018-10-22 ENCOUNTER — Other Ambulatory Visit: Payer: Self-pay | Admitting: *Deleted

## 2018-10-22 DIAGNOSIS — C8580 Other specified types of non-Hodgkin lymphoma, unspecified site: Secondary | ICD-10-CM | POA: Diagnosis not present

## 2018-10-22 DIAGNOSIS — C884 Extranodal marginal zone B-cell lymphoma of mucosa-associated lymphoid tissue [MALT-lymphoma]: Secondary | ICD-10-CM | POA: Insufficient documentation

## 2018-10-22 DIAGNOSIS — R51 Headache: Secondary | ICD-10-CM | POA: Diagnosis not present

## 2018-10-22 LAB — GLUCOSE, CSF: Glucose, CSF: 53 mg/dL (ref 40–70)

## 2018-10-22 LAB — PROTEIN, CSF: Total  Protein, CSF: 33 mg/dL (ref 15–45)

## 2018-10-22 IMAGING — RF DG SPINAL PUNCT LUMBAR DIAG WITH FL CT GUIDANCE
1 series · 1 of 1 positions shown · non-contrast
Comparison: none

CLINICAL DATA: CNS lymphoma.

[Series 1: cp_standard · 0.20mm/px · 1 of 1 slices shown]
[im 1/1]
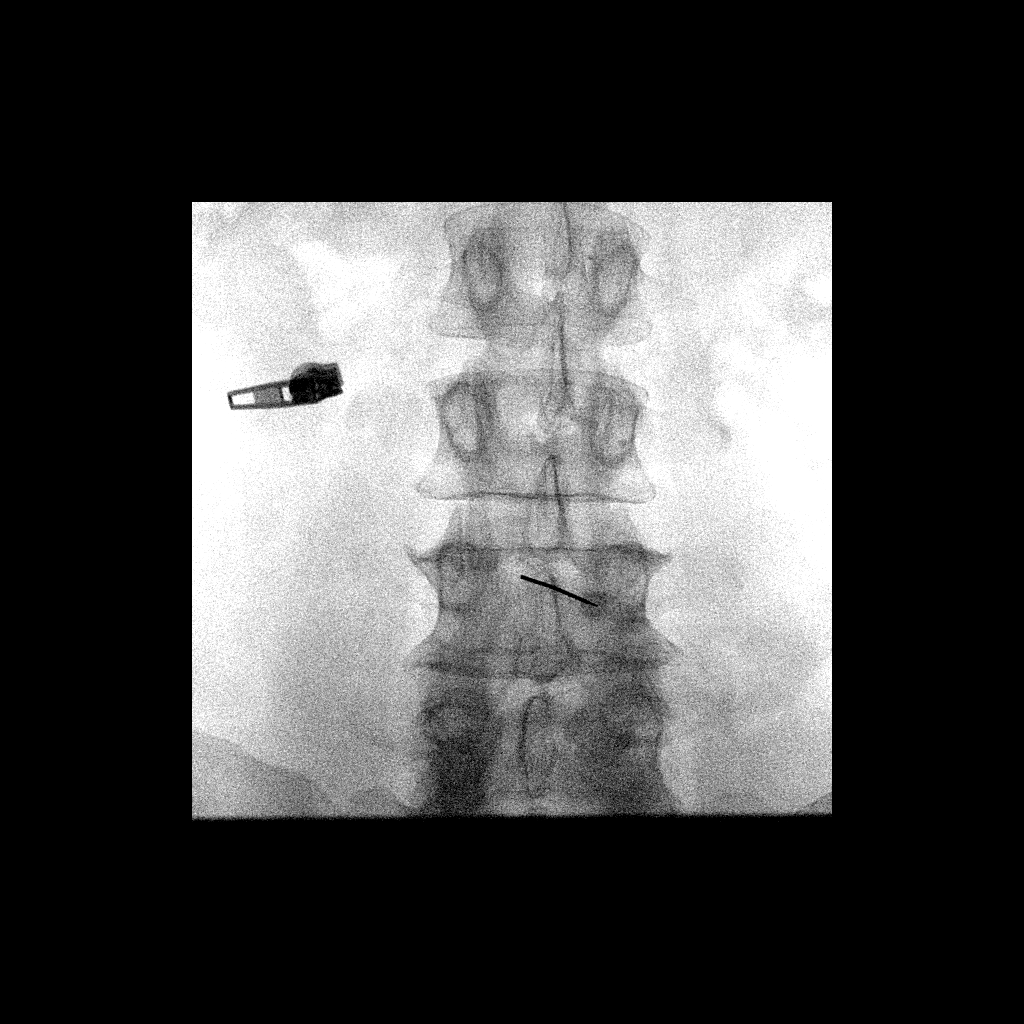

[1 of 1 positions shown; findings below may reference images not displayed]

EXAM:
DIAGNOSTIC LUMBAR PUNCTURE UNDER FLUOROSCOPIC GUIDANCE

FLUOROSCOPY TIME:  Fluoroscopy Time:  18 seconds

Radiation Exposure Index (if provided by the fluoroscopic device):
1.7 mGy

Number of Acquired Spot Images: 0

PROCEDURE:
Informed consent was obtained from the patient prior to the
procedure, including potential complications of headache, allergy,
and pain. With the patient prone, the lower back was prepped with
Betadine. 1% Lidocaine was used for local anesthesia. Lumbar
puncture was performed at the L3-4 level using a 20 gauge needle
with return of clear CSF with an opening pressure of 15 cm water. 9
ml of CSF were obtained for laboratory studies. The patient
tolerated the procedure well and there were no apparent
complications.
IMPRESSION: Technically successful lumbar puncture as above. No immediate
complication.

## 2018-10-22 MED ORDER — LIDOCAINE HCL (PF) 1 % IJ SOLN
5.0000 mL | Freq: Once | INTRAMUSCULAR | Status: AC
  Administered 2018-10-22: 12:00:00 5 mL via INTRADERMAL

## 2018-10-22 MED ORDER — DEXAMETHASONE 1 MG PO TABS: 1.0000 mg | ORAL_TABLET | Freq: Every day | ORAL | 0 refills | Status: DC

## 2018-10-22 NOTE — Telephone Encounter (Signed)
Attempted to contact patient. Left voice mail with this information.  Dr. Irene Limbo wants her to take Dexamethasone 1 mg daily. Prescription sent to [facility]. Encouraged to contact office with questions.

## 2018-10-22 NOTE — Telephone Encounter (Signed)
10/22/2018: Contacted [facility]. Spoke w/William in scheduling. Auth/referral obtained for patient to have MRI-Thoracic and Lumbar at this location. Gwyndolyn Saxon states they will contact patient today or tomorrow to schedule.

## 2018-10-22 NOTE — Discharge Instructions (Signed)
Lumbar Puncture, Care After °This sheet gives you information about how to care for yourself after your procedure. Your health care provider may also give you more specific instructions. If you have problems or questions, contact your health care provider. °What can I expect after the procedure? °After the procedure, it is common to have: °· Mild discomfort or pain at the puncture site. °· A mild headache that is relieved with pain medicines. °Follow these instructions at home: °Activity ° °· Lie down flat or rest for as long as directed by your health care provider. °· Return to your normal activities as told by your health care provider. Ask your health care provider what activities are safe for you. °· Avoid lifting anything heavier than 10 lb (4.5 kg) for at least 12 hours after the procedure. °· Do not drive for 24 hours if you were given a medicine to help you relax (sedative) during your procedure. °· Do not drive or use heavy machinery while taking prescription pain medicine. °Puncture site care °· Remove or change your bandage (dressing) as told by your health care provider. °· Check your puncture area every day for signs of infection. Check for: °? More pain. °? Redness or swelling. °? Fluid or blood leaking from the puncture site. °? Warmth. °? Pus or a bad smell. °General instructions °· Take over-the-counter and prescription medicines only as told by your health care provider. °· Drink enough fluids to keep your urine clear or pale yellow. Your health care provider may recommend drinking caffeine to prevent a headache. °· Keep all follow-up visits as told by your health care provider. This is important. °Contact a health care provider if: °· You have fever or chills. °· You have nausea or vomiting. °· You have a headache that lasts for more than 2 days or does not get better with medicine. °Get help right away if: °· You develop any of the following in your  legs: °? Weakness. °? Numbness. °? Tingling. °· You are unable to control when you urinate or have a bowel movement (incontinence). °· You have signs of infection around your puncture site, such as: °? More pain. °? Redness or swelling. °? Fluid or blood leakage. °? Warmth. °? Pus or a bad smell. °· You are dizzy or you feel like you might faint. °· You have a severe headache, especially when you sit or stand. °Summary °· A lumbar puncture is a procedure in which a small needle is inserted into the lower back to remove fluid that surrounds the brain and spinal cord. °· After this procedure, it is common to have a headache and pain around the needle insertion area. °· Lying flat, staying hydrated, and drinking caffeine can help prevent headaches. °· Monitor your needle insertion site for signs of infection, including warmth, fluid, or more pain. °· Get help right away if you develop leg weakness, leg numbness, incontinence, or severe headaches. °This information is not intended to replace advice given to you by your health care provider. Make sure you discuss any questions you have with your health care provider. °Document Released: 03/24/2013 Document Revised: 05/02/2016 Document Reviewed: 05/02/2016 °[employer] Patient Education © 2020 [employer] ° °

## 2018-10-23 ENCOUNTER — Other Ambulatory Visit: Payer: Self-pay | Admitting: Hematology

## 2018-10-23 DIAGNOSIS — C884 Extranodal marginal zone B-cell lymphoma of mucosa-associated lymphoid tissue [MALT-lymphoma]: Secondary | ICD-10-CM | POA: Insufficient documentation

## 2018-10-23 DIAGNOSIS — Z7189 Other specified counseling: Secondary | ICD-10-CM | POA: Insufficient documentation

## 2018-10-23 LAB — CSF CELL COUNT WITH DIFFERENTIAL
RBC Count, CSF: 0 /mm3
Tube #: 3
WBC, CSF: 2 /mm3 (ref 0–5)

## 2018-10-23 NOTE — Progress Notes (Signed)
START ON PATHWAY REGIMEN - Lymphoma and CLL     Administer weekly:     Rituximab-xxxx   **Always confirm dose/schedule in your pharmacy ordering system**  Patient Characteristics: Marginal Zone Lymphoma, Systemic, First Line, Symptomatic Disease Type: Marginal Zone Lymphoma Disease Type: Not Applicable Disease Type: Not Applicable Localized or Systemic Disease<= Systemic [place] Stage: Unknown Line of Therapy: First Line Asymptomatic or Symptomatic<= Symptomatic Intent of Therapy: Non-Curative / Palliative Intent, Discussed with Patient

## 2018-10-24 ENCOUNTER — Telehealth: Payer: Self-pay | Admitting: Hematology

## 2018-10-24 NOTE — Telephone Encounter (Signed)
Called patient and scheduled appt per 7/23 staff message.  Was not able to reach patient, left a voice message of appt date and time.

## 2018-10-25 LAB — CSF CULTURE W GRAM STAIN: Culture: NO GROWTH

## 2018-10-29 ENCOUNTER — Ambulatory Visit (INDEPENDENT_AMBULATORY_CARE_PROVIDER_SITE_OTHER): Payer: BC Managed Care – PPO | Admitting: Urology

## 2018-10-29 DIAGNOSIS — R311 Benign essential microscopic hematuria: Secondary | ICD-10-CM | POA: Diagnosis not present

## 2018-10-30 ENCOUNTER — Telehealth: Payer: Self-pay | Admitting: *Deleted

## 2018-11-02 NOTE — Progress Notes (Signed)
HEMATOLOGY/ONCOLOGY CLINIC NOTE  Date of Service: 11/03/2018  Patient Care Team: Amber Fire, MD as PCP - General (Internal Medicine)  CHIEF COMPLAINTS/PURPOSE OF CONSULTATION:  Newly Diagnosed Extranodal Marginal Zone Lymphoma  HISTORY OF PRESENTING ILLNESS:   Amber Drake is a wonderful 58 y.o. female who has been referred to [place] by my colleague Dr. Cecil Cobbs in Neuro-Oncology for evaluation and management of her Newly diagnosed Extranodal Marginal Zone Lymphoma. She is accompanied today by her sister [name] via [employer]. The pt reports that she is doing well overall.  Prior to today's visit, the pt presented to the ED on 07/21/18 with complaints of a headache for the past month which presented intermittently and was worsened by bending, coughing, or laughing. She had a CT Head and MRI Brain which revealed extensive dural thickening, as noted below. She then had a right posterior fossa craniectomy on 07/28/18 for open exicisional biopsies of the right cerebellar hemisphere and dura which revealed Extranodal marginal zone lymphoma. The pt then established care with my colleague Dr. Cecil Cobbs in Neuro-Onc on 08/11/18. She has been taking 4mg  Decadron daily.  The pt reports that developed worsened headaches about a month ago, and notes that when she laughed it felt like "electiricty" was running through the sides and back of her head. She notes that some mild headaches had begun a couple months ago. Denies double vision, changes in vision, loss of balance, or other senses being affected. The pt notes that in addition to this, she developed redness in the eyes in the past couple months, saw eye doctor and was prescribed eye drops. Denies other concerns in the last 6 months including bone pains, fatigue, fevers, chills, night sweats or unexpected weight loss. She notes that her headaches have resolved. She notes she began Decadron after her biopsy. She denies having "any symptoms  whatsoever," currently.   Of note prior to the patient's visit today, pt has had an MRI Brain completed on 07/21/18 with results revealing "Extensive dural thickening surrounding the right cerebellar hemisphere with mass-effect and edema in the right cerebellum. The process appears to extend into the upper cervical canal. Mild obstructive hydrocephalus. Differential includes tumor including metastatic disease and lymphoma. Chronic inflammatory process such as sarcoid is a consideration however chest x-ray negative. TB and atypical infection/fungus also possible. 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  On review of systems, pt reports good energy levels, headache resolution, and denies joint issues, skin rashes, dry mouth, dry eyes, fevers, chills, night sweats, bone pains, unexpected weight loss, fatigue, double vision, loss of vision, affected senses, loss of balance, and any other symptoms.   On PMHx the pt reports sickle cel trait, HTN, hyperlipidemia. On Social Hx the pt reports that she smoked 1 ppd for the last 42 years, and reports having quit two days ago. 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 On Family Hx the pt reports dad with lupus and sister with sarcoidosis. Daughter and son with sickle cell disease. Denies other blood disorders or cancers. Endorses Penicillin allergy  Interval History:   Amber Drake returns today for management and evaluation of her Extranodal Marginal Zone Lymphoma. The patient's  last visit with [place] was on 10/07/2018. The pt reports that she is doing well overall.  The pt reports that she is doing well and is not experiencing any symptoms. Denies balance issues, belly pain, and leg  edema.  Of note since the patient's last visit, pt has had MRI brain w and w/o contrast completed on 10/16/2018 with results revealing "1. Resolved dural mass in the right posterior fossa with minimal smooth dural thickening that may be treatment related. Cerebellar edema and mass effect is also resolved. No new site of disease. 2. 6 mm colloid cyst."  She also has had MRI cervical spine w and w/o contrast completed on 10/20/2018 with results revealing "Negative for lymphoma.  No acute abnormality. Central disc protrusion at C5-6 effaces the ventral thecal sac causing mild central canal narrowing. There is also a shallow disc bulge at C6-7 which narrows but does not efface the ventral thecal Sac."  Lastly, she had a lumbar puncture completed on 10/22/2018, with results included in "laboratory data" section.  Lab results today (11/03/2018) of CBC w/diff and CMP is as follows: all values are WNL except for glucose bld at 120. 11/03/2018 LDH at 344  On review of systems, pt reports no symptoms and denies back pain, balance issues, leg edema, belly pain, and any other symptoms.   MEDICAL HISTORY:  Past Medical History:  Diagnosis Date   Anemia    Arthritis    Blood dyscrasia    sickle cell trait   Carbuncle and furuncle    Hypercholesterolemia    Hypertension    does not take meds   Sickle cell trait ([place])     SURGICAL HISTORY: Past Surgical History:  Procedure Laterality Date   APPENDECTOMY     APPLICATION OF CRANIAL NAVIGATION N/A 07/28/2018   Procedure: APPLICATION OF CRANIAL NAVIGATION;  Surgeon: Kary Kos, MD;  Location: [facility];  Service: Neurosurgery;  Laterality: N/A;   CARPAL TUNNEL RELEASE  03/23/2011   Procedure: CARPAL TUNNEL RELEASE;  Surgeon: Sanjuana Kava;  Location: [facility];  Service: Orthopedics;  Laterality: Left;   CARPAL TUNNEL RELEASE  05/10/2011   Procedure: CARPAL TUNNEL RELEASE;  Surgeon: Sanjuana Kava, MD;  Location: [facility];  Service: Orthopedics;   Laterality: Right;   CESAREAN SECTION     x 2   INCISION AND DRAINAGE PERIRECTAL ABSCESS  12/21/09   PR DURAL GRAFT REPAIR,SPINE DEFECT N/A 07/28/2018   Procedure: Stereotactic open biopsy of Right cerebellar hemisphere and dura with brainlab;  Surgeon: Kary Kos, MD;  Location: [facility];  Service: Neurosurgery;  Laterality: N/A;  Stereotactic open biopsy of Right cerebellar hemisphere and dura with brainlab   PROCTOSCOPY  10/17/2010   Procedure: PROCTOSCOPY;  Surgeon: Scherry Ran;  Location: [facility];  Service: General;  Laterality: N/A;  Rigid Proctoscopy/Possible Fistula in Ano  Procedure ended at [place]     x2    SOCIAL HISTORY: Social History   Socioeconomic History   Marital status: Legally Separated    Spouse name: Not on file   Number of children: Not on file   Years of education: Not on file   Highest education level: Not on file  Occupational History   Not on file  Social Needs   Financial resource strain: Not on file   Food insecurity    Worry: Not on file    Inability: Not on file   Transportation needs    Medical: Not on file    Non-medical: Not  on file  Tobacco Use   Smoking status: Current Every Day Smoker    Packs/day: 0.50    Years: 30.00    Pack years: 15.00    Types: Cigarettes   Smokeless tobacco: Never Used  Substance and Sexual Activity   Alcohol use: No   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   Sexual activity: Yes    Partners: Male    Birth control/protection: None  Lifestyle   Physical activity    Days per week: Not on file    Minutes per session: Not on file   Stress: Not on file  Relationships   Social connections    Talks on phone: Not on file    Gets together: Not on file    Attends religious service: Not on file    Active member of club or organization: Not on file    Attends meetings of clubs or organizations: Not on file    Relationship status: Not on file   Intimate partner  violence    Fear of current or ex partner: Not on file    Emotionally abused: Not on file    Physically abused: Not on file    Forced sexual activity: Not on file  Other Topics Concern   Not on file  Social History Narrative   Not on file    FAMILY HISTORY: Family History  Problem Relation Age of Onset   Kidney failure Daughter    Sickle cell anemia Daughter    Sickle cell trait Daughter    Sickle cell anemia Son    Hypertension Mother    Hyperlipidemia Mother    Hypertension Father    Lupus Father        skin   Hypertension Other    Sarcoidosis Sister    Anesthesia problems Neg Hx    Hypotension Neg Hx    Malignant hyperthermia Neg Hx    Pseudochol deficiency Neg Hx     ALLERGIES:  is allergic to penicillins; penicillins cross reactors; and tape.  MEDICATIONS:  Current Outpatient Medications  Medication Sig Dispense Refill   albuterol (PROAIR HFA) 108 (90 Base) MCG/ACT inhaler Inhale 2 puffs into the lungs 4 (four) times daily as needed.     atorvastatin (LIPITOR) 20 MG tablet Take 1 tablet (20 mg total) by mouth daily. 90 tablet 2   cyclobenzaprine (FLEXERIL) 10 MG tablet Take 1 tablet (10 mg total) by mouth 3 (three) times daily as needed for muscle spasms. (Patient not taking: Reported on 08/11/2018) 40 tablet 0   dexamethasone (DECADRON) 1 MG tablet Take 1 tablet (1 mg total) by mouth daily with breakfast. 30 tablet 0   ferrous sulfate 325 (65 FE) MG tablet Take 325 mg by mouth daily with breakfast.     HYDROcodone-acetaminophen (NORCO) 7.5-325 MG tablet Take 1 tablet by mouth every 8 (eight) hours as needed.     lisinopril-hydrochlorothiazide (PRINZIDE,ZESTORETIC) 20-12.5 MG tablet Take 1 tablet by mouth daily.     Multiple Vitamin (MULTIVITAMIN WITH MINERALS) TABS tablet Take 1 tablet by mouth daily.     potassium chloride SA (K-DUR) 20 MEQ tablet 40 meq po twice daily for 5 days then 75meq po daily for 2 weeks and rpt labs with PCP 60 tablet  0   ranitidine (ZANTAC) 150 MG capsule Take 150 mg by mouth 2 (two) times daily.     No current facility-administered medications for this visit.     REVIEW OF SYSTEMS:  A 10+ POINT REVIEW OF SYSTEMS WAS OBTAINED including neurology, dermatology, psychiatry, cardiac, respiratory, lymph, extremities, GI, GU, Musculoskeletal, constitutional, breasts, reproductive, HEENT.  All pertinent positives are noted in the HPI.  All others are negative.   PHYSICAL EXAMINATION: ECOG PERFORMANCE STATUS: 1 - Symptomatic but completely ambulatory  . Vitals:   11/03/18 1123  BP: (!) 144/87  Pulse: 72  Resp: 18  Temp: 98.5 F (36.9 C)  SpO2: 100%   Filed Weights   11/03/18 1123  Weight: 171 lb 14.4 oz (78 kg)   .Body mass index is 26.92 kg/m.   VS reviewed  GENERAL:alert, in no acute distress and comfortable SKIN: no acute rashes, no significant lesions EYES: conjunctiva are pink and non-injected, sclera anicteric OROPHARYNX: MMM, no exudates, no oropharyngeal erythema or ulceration NECK: supple, no JVD LYMPH:  no palpable lymphadenopathy in the cervical, axillary or inguinal regions LUNGS: clear to auscultation b/l with normal respiratory effort HEART: regular rate & rhythm ABDOMEN:  normoactive bowel sounds , non tender, not distended. No palpable hepatosplenomegaly.  Extremity: no pedal edema PSYCH: alert & oriented x 3 with fluent speech NEURO: no focal motor/sensory deficits   LABORATORY DATA:  I have reviewed the data as listed  10/22/2018 Lumbar Puncture Component     Latest Ref Rng & Units 10/22/2018  Tube #      3  Color, CSF     COLORLESS COLORLESS  Appearance, CSF     CLEAR CLEAR  Supernatant      NOT INDICATED  RBC Count, CSF     0 /cu mm 0  WBC, CSF     0 - 5 /cu mm 2  Segmented Neutrophils-CSF     0 - 6 % TOO FEW TO COUNT, SMEAR AVAILABLE FOR REVIEW  Other Cells, CSF      FEW MONONUCLEAR  Specimen Description      CSF  Special Requests      NONE   Gram Stain      WBC PRESENT, PREDOMINANTLY MONONUCLEAR . . .  Culture      NO GROWTH 3 DAYS . . .  Report Status      10/25/2018 FINAL  Glucose, CSF     40 - 70 mg/dL 53  Total  Protein, CSF     15 - 45 mg/dL 33   . CBC Latest Ref Rng & Units 11/03/2018 10/07/2018 09/12/2018  WBC 4.0 - 10.5 K/uL 7.5 7.1 11.2(H)  Hemoglobin 12.0 - 15.0 g/dL 12.5 11.7(L) 11.8(L)  Hematocrit 36.0 - 46.0 % 38.2 35.1(L) 35.2(L)  Platelets 150 - 400 K/uL 299 451(H) 346    . CMP Latest Ref Rng & Units 11/03/2018 10/07/2018 08/13/2018  Glucose 70 - 99 mg/dL 120(H) 128(H) 121(H)  BUN 6 - 20 mg/dL 12 7 23(H)  Creatinine 0.44 - 1.00 mg/dL 0.90 0.81 0.92  Sodium 135 - 145 mmol/L 139 142 135  Potassium 3.5 - 5.1 mmol/L 3.5 2.7(LL) 3.6  Chloride 98 - 111 mmol/L 105 103 97(L)  CO2 22 - 32 mmol/L 25 27 29   Calcium 8.9 - 10.3 mg/dL 9.9 9.8 9.5  Total Protein 6.5 - 8.1 g/dL 7.2 7.4 7.2  Total Bilirubin 0.3 - 1.2 mg/dL 0.3 0.2(L) 0.4  Alkaline Phos 38 - 126 U/L 52 67 55  AST 15 - 41 U/L 16 18 15   ALT 0 - 44 U/L 19 12 24    10/22/2018 Lumbar Puncture Cytopathology   09/12/18 BM Biopsy:    07/28/18 Right cerebellar hemisphere and  dura biopsy:     RADIOGRAPHIC STUDIES: I have personally reviewed the radiological images as listed and agreed with the findings in the report. Mr Jeri Cos Wo Contrast  Result Date: 10/17/2018 CLINICAL DATA:  CNS lymphoma EXAM: MRI HEAD WITHOUT AND WITH CONTRAST TECHNIQUE: Multiplanar, multiecho pulse sequences of the brain and surrounding structures were obtained without and with intravenous contrast. CONTRAST:  7 cc Gadavist intravenous COMPARISON:  07/21/2018 FINDINGS: Brain: At site of prior dural mass in the right posterior fossa there is now very mild and smooth dural thickening which may be treatment related. Unremarkable craniectomy and cranioplasty in the right occipital bone. No new site of abnormal or masslike enhancement. Cerebellar edema and mass effect is resolved. No  infarct, hemorrhage, hydrocephalus, or collection. 6 mm colloid cyst. Vascular: Major flow voids and vascular enhancements are preserved Skull and upper cervical spine: Unremarkable right occipital cranioplasty. Sinuses/Orbits: Negative IMPRESSION: 1. Resolved dural mass in the right posterior fossa with minimal smooth dural thickening that may be treatment related. Cerebellar edema and mass effect is also resolved. No new site of disease. 2. 6 mm colloid cyst. Electronically Signed   By: Monte Fantasia M.D.   On: 10/17/2018 06:39   Mr Cervical Spine W Wo Contrast  Result Date: 10/21/2018 CLINICAL DATA:  History of primary CNS lymphoma. Staging examination. EXAM: MRI CERVICAL SPINE WITHOUT AND WITH CONTRAST TECHNIQUE: Multiplanar and multiecho pulse sequences of the cervical spine, to include the craniocervical junction and cervicothoracic junction, were obtained without and with intravenous contrast. CONTRAST:  8 cc Gadavist IV. COMPARISON:  Brain MRI 10/16/2018 and 07/21/2018. PET CT scan 09/08/2018. FINDINGS: Alignment: Normal. Vertebrae: No fracture, evidence of discitis, or bone lesion. Cord: Normal signal throughout. No dural thickening or enhancement is identified. Posterior Fossa, vertebral arteries, paraspinal tissues: Right occipital craniectomy defect noted. Otherwise negative. Disc levels: C2-3: Negative. C3-4: Minimal disc bulge without stenosis. C4-5: Shallow disc bulge without stenosis. C5-6: Central disc protrusion effaces the ventral thecal sac and causes mild central canal narrowing. Foramina are open. C6-7: Shallow disc bulge nearly effaces the ventral thecal sac. Foramina are open. C7-T1: Negative. IMPRESSION: Negative for lymphoma.  No acute abnormality. Central disc protrusion at C5-6 effaces the ventral thecal sac causing mild central canal narrowing. There is also a shallow disc bulge at C6-7 which narrows but does not efface the ventral thecal sac. Electronically Signed   By: Inge Rise M.D.   On: 10/21/2018 08:27   Dg Fl Guided Lumbar Puncture  Result Date: 10/22/2018 CLINICAL DATA:  CNS lymphoma. EXAM: DIAGNOSTIC LUMBAR PUNCTURE UNDER FLUOROSCOPIC GUIDANCE FLUOROSCOPY TIME:  Fluoroscopy Time:  18 seconds Radiation Exposure Index (if provided by the fluoroscopic device): 1.7 mGy Number of Acquired Spot Images: 0 PROCEDURE: Informed consent was obtained from the patient prior to the procedure, including potential complications of headache, allergy, and pain. With the patient prone, the lower back was prepped with Betadine. 1% Lidocaine was used for local anesthesia. Lumbar puncture was performed at the L3-4 level using a 20 gauge needle with return of clear CSF with an opening pressure of 15 cm water. 9 ml of CSF were obtained for laboratory studies. The patient tolerated the procedure well and there were no apparent complications. IMPRESSION: Technically successful lumbar puncture as above. No immediate complication. Electronically Signed   By: Rolm Baptise M.D.   On: 10/22/2018 11:47    ASSESSMENT & PLAN:  58 y.o. female with  1. Newly Diagnosed Extranodal Marginal Zone Lymphoma involving meninges in  posterior fossa Labs upon initial presentation from 07/28/18, HGB stable at 11.1, WBC higher at 14.2k, PLT normal and stable at 374k 11/18/17 HIV Antibody non-reactive  07/28/18 Right cerebellar hemisphere and dura biopsy which revealed Extranodal Marginal Zone Lymphoma 07/21/18 MRI Brain revealed "Extensive dural thickening surrounding the right cerebellar hemisphere with mass-effect and edema in the right cerebellum. The process appears to extend into the upper cervical canal. Mild obstructive hydrocephalus. Differential includes tumor including metastatic disease and lymphoma. Chronic inflammatory process such as sarcoid is a consideration however chest x-ray negative. TB and atypical infection/fungus also possible. 6 mm colloid cyst felt to be a separate problem."  07/25/18  CT C/A/P which did not reveal significant abnormality  09/12/18 BM Bx revealed no evidence of lymphoma  09/08/18 PET/CT revealed "No hypermetabolic mass or adenopathy identified within the chest abdomen or pelvis to suggest metabolically active tumor. 2. Nonspecific focus of increased uptake within the cord extending from T12 to L1. Cannot rule out additional site of CNS lymphoma. Consider further evaluation with contrast enhanced MRI through this area. 3. Aortic Atherosclerosis and Emphysema."  10/16/2018 MRI brain w and w/o contrast revealed "1. Resolved dural mass in the right posterior fossa with minimal smooth dural thickening that may be treatment related. Cerebellar edema and mass effect is also resolved. No new site of disease. 2. 6 mm colloid cyst."  10/20/2018 MRI cervical spine w and w/o contrast revealed "Negative for lymphoma.  No acute abnormality. Central disc protrusion at C5-6 effaces the ventral thecal sac causing mild central canal narrowing. There is also a shallow disc bulge at C6-7 which narrows but does not efface the ventral thecal Sac."  PLAN -Discussed pt labwork today, 11/03/2018; blood counts, LDH is elevated at 344 -Discussed 10/16/2018 MRI brain w and w/o contrast which revealed that pt responded well to steroids alone. -Discussed 10/20/2018 MRI cervical spine w and w/o contrast which revealed no signs of lymphoma -Since the pt responded so well to steroids alone, recommend holding off on high dose chemotherapy at this time. -Plan to begin Rituxan today and continue weekly for 4 weeks. Afterwards, plan for maintenance Rituxan every 2-3 months for 1-2 years -Will plan to taper off of steroids starting at her next visit -Will see the pt back on 11/10/2018   F/u as scheduled for 2nd dose of Rituxan with labs on 8/10. Plz add MD visit to 8/10 for toxicity check   All of the patients questions were answered with apparent satisfaction. The patient knows to call the clinic  with any problems, questions or concerns.  The total time spent in the appt was 25 minutes and more than 50% was on counseling and direct patient cares.  Sullivan Lone MD MS AAHIVMS [facility] Hematology/Oncology Physician [facility]  (Office):       [phone] (Work cell):  [phone] (Fax):           [phone]  11/03/2018 12:22 PM  I, De Burrs, am acting as a scribe for Dr. Irene Limbo  .Patient was Personally and independently interviewed, examined and relevant elements of the history of present illness were reviewed in details and an assessment and plan was created. All elements of the patient's history of present illness , assessment and plan were discussed in details with **. The above documentation reflects our combined findings assessment and plan.  .I have reviewed the above documentation for accuracy and completeness, and I agree with the above. Brunetta Genera MD

## 2018-11-03 ENCOUNTER — Inpatient Hospital Stay: Payer: BC Managed Care – PPO | Attending: Internal Medicine

## 2018-11-03 ENCOUNTER — Inpatient Hospital Stay (HOSPITAL_BASED_OUTPATIENT_CLINIC_OR_DEPARTMENT_OTHER): Payer: BC Managed Care – PPO | Admitting: Hematology

## 2018-11-03 ENCOUNTER — Other Ambulatory Visit: Payer: Self-pay | Admitting: Hematology

## 2018-11-03 ENCOUNTER — Telehealth: Payer: Self-pay | Admitting: Hematology

## 2018-11-03 ENCOUNTER — Inpatient Hospital Stay: Payer: BC Managed Care – PPO

## 2018-11-03 ENCOUNTER — Other Ambulatory Visit: Payer: Self-pay

## 2018-11-03 VITALS — BP 114/58 | HR 70 | Temp 98.5°F | Resp 18

## 2018-11-03 VITALS — BP 144/87 | HR 72 | Temp 98.5°F | Resp 18 | Ht 67.0 in | Wt 171.9 lb

## 2018-11-03 DIAGNOSIS — M1990 Unspecified osteoarthritis, unspecified site: Secondary | ICD-10-CM | POA: Insufficient documentation

## 2018-11-03 DIAGNOSIS — Z5112 Encounter for antineoplastic immunotherapy: Secondary | ICD-10-CM | POA: Insufficient documentation

## 2018-11-03 DIAGNOSIS — C884 Extranodal marginal zone B-cell lymphoma of mucosa-associated lymphoid tissue [MALT-lymphoma]: Secondary | ICD-10-CM | POA: Insufficient documentation

## 2018-11-03 DIAGNOSIS — E785 Hyperlipidemia, unspecified: Secondary | ICD-10-CM | POA: Diagnosis not present

## 2018-11-03 DIAGNOSIS — F17210 Nicotine dependence, cigarettes, uncomplicated: Secondary | ICD-10-CM | POA: Diagnosis not present

## 2018-11-03 DIAGNOSIS — Z79899 Other long term (current) drug therapy: Secondary | ICD-10-CM | POA: Diagnosis not present

## 2018-11-03 DIAGNOSIS — D573 Sickle-cell trait: Secondary | ICD-10-CM | POA: Diagnosis not present

## 2018-11-03 DIAGNOSIS — I10 Essential (primary) hypertension: Secondary | ICD-10-CM | POA: Insufficient documentation

## 2018-11-03 DIAGNOSIS — Z7189 Other specified counseling: Secondary | ICD-10-CM

## 2018-11-03 LAB — CMP (CANCER CENTER ONLY)
ALT: 19 U/L (ref 0–44)
AST: 16 U/L (ref 15–41)
Albumin: 3.7 g/dL (ref 3.5–5.0)
Alkaline Phosphatase: 52 U/L (ref 38–126)
Anion gap: 9 (ref 5–15)
BUN: 12 mg/dL (ref 6–20)
CO2: 25 mmol/L (ref 22–32)
Calcium: 9.9 mg/dL (ref 8.9–10.3)
Chloride: 105 mmol/L (ref 98–111)
Creatinine: 0.9 mg/dL (ref 0.44–1.00)
GFR, Est AFR Am: >60 mL/min (ref >60)
GFR, Estimated: >60 mL/min (ref >60)
Glucose, Bld: 120 mg/dL — ABNORMAL HIGH (ref 70–99)
Potassium: 3.5 mmol/L (ref 3.5–5.1)
Sodium: 139 mmol/L (ref 135–145)
Total Bilirubin: 0.3 mg/dL (ref 0.3–1.2)
Total Protein: 7.2 g/dL (ref 6.5–8.1)

## 2018-11-03 LAB — CBC WITH DIFFERENTIAL/PLATELET
Abs Immature Granulocytes: 0.02 10*3/uL (ref 0.00–0.07)
Basophils Absolute: 0 10*3/uL (ref 0.0–0.1)
Basophils Relative: 0 %
Eosinophils Absolute: 0 10*3/uL (ref 0.0–0.5)
Eosinophils Relative: 0 %
HCT: 38.2 % (ref 36.0–46.0)
Hemoglobin: 12.5 g/dL (ref 12.0–15.0)
Immature Granulocytes: 0 %
Lymphocytes Relative: 21 %
Lymphs Abs: 1.6 10*3/uL (ref 0.7–4.0)
MCH: 29.3 pg (ref 26.0–34.0)
MCHC: 32.7 g/dL (ref 30.0–36.0)
MCV: 89.7 fL (ref 80.0–100.0)
Monocytes Absolute: 0.3 10*3/uL (ref 0.1–1.0)
Monocytes Relative: 4 %
Neutro Abs: 5.7 10*3/uL (ref 1.7–7.7)
Neutrophils Relative %: 75 %
Platelets: 299 10*3/uL (ref 150–400)
RBC: 4.26 MIL/uL (ref 3.87–5.11)
RDW: 13.7 % (ref 11.5–15.5)
WBC: 7.5 10*3/uL (ref 4.0–10.5)
nRBC: 0 % (ref 0.0–0.2)

## 2018-11-03 LAB — LACTATE DEHYDROGENASE: LDH: 344 U/L — ABNORMAL HIGH (ref 98–192)

## 2018-11-03 MED ORDER — ACETAMINOPHEN 325 MG PO TABS
ORAL_TABLET | ORAL | Status: AC
  Filled 2018-11-03: qty 2

## 2018-11-03 MED ORDER — SODIUM CHLORIDE 0.9 % IV SOLN
Freq: Once | INTRAVENOUS | Status: AC
  Administered 2018-11-03: 13:00:00 via INTRAVENOUS
  Filled 2018-11-03: qty 250

## 2018-11-03 MED ORDER — FAMOTIDINE IN NACL 20-0.9 MG/50ML-% IV SOLN
INTRAVENOUS | Status: AC
  Filled 2018-11-03: qty 50

## 2018-11-03 MED ORDER — DIPHENHYDRAMINE HCL 25 MG PO CAPS
50.0000 mg | ORAL_CAPSULE | Freq: Once | ORAL | Status: AC
  Administered 2018-11-03: 13:00:00 50 mg via ORAL

## 2018-11-03 MED ORDER — FAMOTIDINE IN NACL 20-0.9 MG/50ML-% IV SOLN
20.0000 mg | Freq: Once | INTRAVENOUS | Status: AC
  Administered 2018-11-03: 13:00:00 20 mg via INTRAVENOUS

## 2018-11-03 MED ORDER — ACETAMINOPHEN 325 MG PO TABS
650.0000 mg | ORAL_TABLET | Freq: Once | ORAL | Status: AC
  Administered 2018-11-03: 13:00:00 650 mg via ORAL

## 2018-11-03 MED ORDER — DIPHENHYDRAMINE HCL 25 MG PO CAPS
ORAL_CAPSULE | ORAL | Status: AC
  Filled 2018-11-03: qty 2

## 2018-11-03 MED ORDER — DEXAMETHASONE SODIUM PHOSPHATE 10 MG/ML IJ SOLN
INTRAMUSCULAR | Status: AC
  Filled 2018-11-03: qty 1

## 2018-11-03 MED ORDER — SODIUM CHLORIDE 0.9 % IV SOLN
375.0000 mg/m2 | Freq: Once | Status: AC
  Administered 2018-11-03: 14:00:00 via INTRAVENOUS
  Filled 2018-11-03: qty 70

## 2018-11-03 MED ORDER — DEXAMETHASONE SODIUM PHOSPHATE 10 MG/ML IJ SOLN
10.0000 mg | Freq: Once | INTRAMUSCULAR | Status: AC
  Administered 2018-11-03: 10 mg via INTRAVENOUS

## 2018-11-03 NOTE — Progress Notes (Signed)
The following biosimilar Ruxience (rituximab-pvvr) has been selected for use in this patient.  Kennith Center, Pharm.D., CPP 11/03/2018@12 :54 PM

## 2018-11-03 NOTE — Progress Notes (Signed)
[facility] verification check performed by Demetrius Charity. RN aware to titrate per administration instructions.   Demetrius Charity, PharmD, [facility] Pharmacist Pharmacy Phone: [phone] 11/03/2018

## 2018-11-03 NOTE — Patient Instructions (Signed)
[facility] Discharge Instructions for Patients Receiving Chemotherapy  Today you received the following chemotherapy agents Rituxan  To help prevent nausea and vomiting after your treatment, we encourage you to take your nausea medication as prescribed.   If you develop nausea and vomiting that is not controlled by your nausea medication, call the clinic.   BELOW ARE SYMPTOMS THAT SHOULD BE REPORTED IMMEDIATELY:  *FEVER GREATER THAN 100.5 F  *CHILLS WITH OR WITHOUT FEVER  NAUSEA AND VOMITING THAT IS NOT CONTROLLED WITH YOUR NAUSEA MEDICATION  *UNUSUAL SHORTNESS OF BREATH  *UNUSUAL BRUISING OR BLEEDING  TENDERNESS IN MOUTH AND THROAT WITH OR WITHOUT PRESENCE OF ULCERS  *URINARY PROBLEMS  *BOWEL PROBLEMS  UNUSUAL RASH Items with * indicate a potential emergency and should be followed up as soon as possible.  Feel free to call the clinic should you have any questions or concerns. The clinic phone number is [phone].  Please show the [facility] at check-in to the Emergency Department and triage nurse.  Rituximab injection What is this medicine? RITUXIMAB (ri TUX i mab) is a monoclonal antibody. It is used to treat certain types of cancer like non-Hodgkin lymphoma and chronic lymphocytic leukemia. It is also used to treat rheumatoid arthritis, granulomatosis with polyangiitis (or Wegener's granulomatosis), microscopic polyangiitis, and pemphigus vulgaris. This medicine may be used for other purposes; ask your health care provider or pharmacist if you have questions. COMMON BRAND NAME(S): Rituxan, RUXIENCE What should I tell my health care provider before I take this medicine? They need to know if you have any of these conditions:  heart disease  infection (especially a virus infection such as hepatitis B, chickenpox, cold sores, or herpes)  immune system problems  irregular heartbeat  kidney disease  low blood counts, like low white cell,  platelet, or red cell counts  lung or breathing disease, like asthma  recently received or scheduled to receive a vaccine  an unusual or allergic reaction to rituximab, other medicines, foods, dyes, or preservatives  pregnant or trying to get pregnant  breast-feeding How should I use this medicine? This medicine is for infusion into a vein. It is administered in a hospital or clinic by a specially trained health care professional. A special MedGuide will be given to you by the pharmacist with each prescription and refill. Be sure to read this information carefully each time. Talk to your pediatrician regarding the use of this medicine in children. This medicine is not approved for use in children. Overdosage: If you think you have taken too much of this medicine contact a poison control center or emergency room at once. NOTE: This medicine is only for you. Do not share this medicine with others. What if I miss a dose? It is important not to miss a dose. Call your doctor or health care professional if you are unable to keep an appointment. What may interact with this medicine?  cisplatin  live virus vaccines This list may not describe all possible interactions. Give your health care provider a list of all the medicines, herbs, non-prescription drugs, or dietary supplements you use. Also tell them if you smoke, drink alcohol, or use illegal drugs. Some items may interact with your medicine. What should I watch for while using this medicine? Your condition will be monitored carefully while you are receiving this medicine. You may need blood work done while you are taking this medicine. This medicine can cause serious allergic reactions. To reduce your risk you may  need to take medicine before treatment with this medicine. Take your medicine as directed. In some patients, this medicine may cause a serious brain infection that may cause death. If you have any problems seeing, thinking,  speaking, walking, or standing, tell your healthcare professional right away. If you cannot reach your healthcare professional, urgently seek other source of medical care. Call your doctor or health care professional for advice if you get a fever, chills or sore throat, or other symptoms of a cold or flu. Do not treat yourself. This drug decreases your body's ability to fight infections. Try to avoid being around people who are sick. Do not become pregnant while taking this medicine or for at least 12 months after stopping it. Women should inform their doctor if they wish to become pregnant or think they might be pregnant. There is a potential for serious side effects to an unborn child. Talk to your health care professional or pharmacist for more information. Do not breast-feed an infant while taking this medicine or for at least 6 months after stopping it. What side effects may I notice from receiving this medicine? Side effects that you should report to your doctor or health care professional as soon as possible:  allergic reactions like skin rash, itching or hives; swelling of the face, lips, or tongue  breathing problems  chest pain  changes in vision  diarrhea  headache with fever, neck stiffness, sensitivity to light, nausea, or confusion  fast, irregular heartbeat  loss of memory  low blood counts - this medicine may decrease the number of white blood cells, red blood cells and platelets. You may be at increased risk for infections and bleeding.  mouth sores  problems with balance, talking, or walking  redness, blistering, peeling or loosening of the skin, including inside the mouth  signs of infection - fever or chills, cough, sore throat, pain or difficulty passing urine  signs and symptoms of kidney injury like trouble passing urine or change in the amount of urine  signs and symptoms of liver injury like dark yellow or brown urine; general ill feeling or flu-like  symptoms; light-colored stools; loss of appetite; nausea; right upper belly pain; unusually weak or tired; yellowing of the eyes or skin  signs and symptoms of low blood pressure like dizziness; feeling faint or lightheaded, falls; unusually weak or tired  stomach pain  swelling of the ankles, feet, hands  unusual bleeding or bruising  vomiting Side effects that usually do not require medical attention (report to your doctor or health care professional if they continue or are bothersome):  headache  joint pain  muscle cramps or muscle pain  nausea  tiredness This list may not describe all possible side effects. Call your doctor for medical advice about side effects. You may report side effects to FDA at 1-800-FDA-1088. Where should I keep my medicine? This drug is given in a hospital or clinic and will not be stored at home. NOTE: This sheet is a summary. It may not cover all possible information. If you have questions about this medicine, talk to your doctor, pharmacist, or health care provider.  2020 [employer] (2018-04-30 22:01:36)

## 2018-11-03 NOTE — Telephone Encounter (Signed)
Scheduled appt per 8/3 los. ° °Printed calendar and avs. °

## 2018-11-04 ENCOUNTER — Telehealth: Payer: Self-pay | Admitting: *Deleted

## 2018-11-04 MED FILL — Rituximab-pvvr IV Soln 100 MG/10ML (10 MG/ML): INTRAVENOUS | Qty: 20 | Status: AC

## 2018-11-04 MED FILL — Rituximab-pvvr IV Soln 500 MG/50ML (10 MG/ML): INTRAVENOUS | Qty: 50 | Status: AC

## 2018-11-04 MED FILL — Sodium Chloride IV Soln 0.9%: INTRAVENOUS | Qty: 250 | Status: AC

## 2018-11-09 NOTE — Progress Notes (Signed)
HEMATOLOGY/ONCOLOGY CLINIC NOTE  Date of Service: 11/10/2018  Patient Care Team: [name], MD as PCP - General (Internal Medicine)  CHIEF COMPLAINTS/PURPOSE OF CONSULTATION:  Newly Diagnosed Extranodal Marginal Zone Lymphoma  HISTORY OF PRESENTING ILLNESS:   Amber Drake is a wonderful 58 y.o. female who has been referred to [place] by my colleague Dr. Cecil Cobbs in Neuro-Oncology for evaluation and management of her Newly diagnosed Extranodal Marginal Zone Lymphoma. She is accompanied today by her sister [name] via [employer]. The pt reports that she is doing well overall.  Prior to today's visit, the pt presented to the ED on 07/21/18 with complaints of a headache for the past month which presented intermittently and was worsened by bending, coughing, or laughing. She had a CT Head and MRI Brain which revealed extensive dural thickening, as noted below. She then had a right posterior fossa craniectomy on 07/28/18 for open exicisional biopsies of the right cerebellar hemisphere and dura which revealed Extranodal marginal zone lymphoma. The pt then established care with my colleague Dr. Cecil Cobbs in Neuro-Onc on 08/11/18. She has been taking 4mg  Decadron daily.  The pt reports that developed worsened headaches about a month ago, and notes that when she laughed it felt like "electiricty" was running through the sides and back of her head. She notes that some mild headaches had begun a couple months ago. Denies double vision, changes in vision, loss of balance, or other senses being affected. The pt notes that in addition to this, she developed redness in the eyes in the past couple months, saw eye doctor and was prescribed eye drops. Denies other concerns in the last 6 months including bone pains, fatigue, fevers, chills, night sweats or unexpected weight loss. She notes that her headaches have resolved. She notes she began Decadron after her biopsy. She denies having "any symptoms  whatsoever," currently.   Of note prior to the patient's visit today, pt has had an MRI Brain completed on 07/21/18 with results revealing "Extensive dural thickening surrounding the right cerebellar hemisphere with mass-effect and edema in the right cerebellum. The process appears to extend into the upper cervical canal. Mild obstructive hydrocephalus. Differential includes tumor including metastatic disease and lymphoma. Chronic inflammatory process such as sarcoid is a consideration however chest x-ray negative. TB and atypical infection/fungus also possible. 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  On review of systems, pt reports good energy levels, headache resolution, and denies joint issues, skin rashes, dry mouth, dry eyes, fevers, chills, night sweats, bone pains, unexpected weight loss, fatigue, double vision, loss of vision, affected senses, loss of balance, and any other symptoms.   On PMHx the pt reports sickle cel trait, HTN, hyperlipidemia. On Social Hx the pt reports that she smoked 1 ppd for the last 42 years, and reports having quit two days ago. 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 On Family Hx the pt reports dad with lupus and sister with sarcoidosis. Daughter and son with sickle cell disease. Denies other blood disorders or cancers. Endorses Penicillin allergy  Interval History:   Amber Drake returns today for management and evaluation of her Extranodal Marginal Zone Lymphoma. She is  here for a toxicity check after second dose of Rituxan. The patient's last visit with [place] was on 11/03/2018. The pt reports that she is doing well overall.  The pt reports that she is doing well after he first dose of  Rituxan. Denies headaches, balance issues, and fatigue. She takes 1 mg daily of dexamethasone.  Lab results today (11/10/2018) of CBC w/diff and CMP is as follows: all values are WNL except for glucose bld at 112  On review of systems, pt reports no new concerns and denies headaches, balance issues, fatigue and any other symptoms.   MEDICAL HISTORY:  Past Medical History:  Diagnosis Date   Anemia    Arthritis    Blood dyscrasia    sickle cell trait   Carbuncle and furuncle    Hypercholesterolemia    Hypertension    does not take meds   Sickle cell trait ([place])     SURGICAL HISTORY: Past Surgical History:  Procedure Laterality Date   APPENDECTOMY     APPLICATION OF CRANIAL NAVIGATION N/A 07/28/2018   Procedure: APPLICATION OF CRANIAL NAVIGATION;  Surgeon: Kary Kos, MD;  Location: [facility];  Service: Neurosurgery;  Laterality: N/A;   CARPAL TUNNEL RELEASE  03/23/2011   Procedure: CARPAL TUNNEL RELEASE;  Surgeon: Sanjuana Kava;  Location: [facility];  Service: Orthopedics;  Laterality: Left;   CARPAL TUNNEL RELEASE  05/10/2011   Procedure: CARPAL TUNNEL RELEASE;  Surgeon: Sanjuana Kava, MD;  Location: [facility];  Service: Orthopedics;  Laterality: Right;   CESAREAN SECTION     x 2   INCISION AND DRAINAGE PERIRECTAL ABSCESS  12/21/09   PR DURAL GRAFT REPAIR,SPINE DEFECT N/A 07/28/2018   Procedure: Stereotactic open biopsy of Right cerebellar hemisphere and dura with brainlab;  Surgeon: Kary Kos, MD;  Location: [facility];  Service: Neurosurgery;  Laterality: N/A;  Stereotactic open biopsy of Right cerebellar hemisphere and dura with brainlab   PROCTOSCOPY  10/17/2010   Procedure: PROCTOSCOPY;  Surgeon: Scherry Ran;  Location: [facility];  Service: General;  Laterality: N/A;  Rigid Proctoscopy/Possible Fistula in Ano  Procedure ended at [place]     x2    SOCIAL HISTORY: Social History   Socioeconomic History   Marital status: Legally Separated     Spouse name: Not on file   Number of children: Not on file   Years of education: Not on file   Highest education level: Not on file  Occupational History   Not on file  Social Needs   Financial resource strain: Not on file   Food insecurity    Worry: Not on file    Inability: Not on file   Transportation needs    Medical: Not on file    Non-medical: Not on file  Tobacco Use   Smoking status: Current Every Day Smoker    Packs/day: 0.50    Years: 30.00    Pack years: 15.00    Types: Cigarettes   Smokeless tobacco: Never Used  Substance and Sexual Activity   Alcohol use: No   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   Sexual activity: Yes    Partners: Male    Birth control/protection: None  Lifestyle   Physical activity    Days per week: Not on file    Minutes per session: Not on file   Stress: Not on file  Relationships   Social connections    Talks on phone: Not on file  Gets together: Not on file    Attends religious service: Not on file    Active member of club or organization: Not on file    Attends meetings of clubs or organizations: Not on file    Relationship status: Not on file   Intimate partner violence    Fear of current or ex partner: Not on file    Emotionally abused: Not on file    Physically abused: Not on file    Forced sexual activity: Not on file  Other Topics Concern   Not on file  Social History Narrative   Not on file    FAMILY HISTORY: Family History  Problem Relation Age of Onset   Kidney failure Daughter    Sickle cell anemia Daughter    Sickle cell trait Daughter    Sickle cell anemia Son    Hypertension Mother    Hyperlipidemia Mother    Hypertension Father    Lupus Father        skin   Hypertension Other    Sarcoidosis Sister    Anesthesia problems Neg Hx    Hypotension Neg Hx    Malignant hyperthermia Neg Hx    Pseudochol deficiency Neg Hx     ALLERGIES:  is allergic to  penicillins; penicillins cross reactors; and tape.  MEDICATIONS:  Current Outpatient Medications  Medication Sig Dispense Refill   albuterol (PROAIR HFA) 108 (90 Base) MCG/ACT inhaler Inhale 2 puffs into the lungs 4 (four) times daily as needed.     atorvastatin (LIPITOR) 20 MG tablet Take 1 tablet (20 mg total) by mouth daily. 90 tablet 2   cyclobenzaprine (FLEXERIL) 10 MG tablet Take 1 tablet (10 mg total) by mouth 3 (three) times daily as needed for muscle spasms. (Patient not taking: Reported on 08/11/2018) 40 tablet 0   dexamethasone (DECADRON) 1 MG tablet Take 1 tablet (1 mg total) by mouth daily with breakfast. 20 tablet 0   ferrous sulfate 325 (65 FE) MG tablet Take 325 mg by mouth daily with breakfast.     HYDROcodone-acetaminophen (NORCO) 7.5-325 MG tablet Take 1 tablet by mouth every 8 (eight) hours as needed.     lisinopril-hydrochlorothiazide (PRINZIDE,ZESTORETIC) 20-12.5 MG tablet Take 1 tablet by mouth daily.     Multiple Vitamin (MULTIVITAMIN WITH MINERALS) TABS tablet Take 1 tablet by mouth daily.     potassium chloride SA (K-DUR) 20 MEQ tablet 40 meq po twice daily for 5 days then 3meq po daily for 2 weeks and rpt labs with PCP 60 tablet 0   ranitidine (ZANTAC) 150 MG capsule Take 150 mg by mouth 2 (two) times daily.     No current facility-administered medications for this visit.     REVIEW OF SYSTEMS:    A 10+ POINT REVIEW OF SYSTEMS WAS OBTAINED including neurology, dermatology, psychiatry, cardiac, respiratory, lymph, extremities, GI, GU, Musculoskeletal, constitutional, breasts, reproductive, HEENT.  All pertinent positives are noted in the HPI.  All others are negative.   PHYSICAL EXAMINATION: ECOG PERFORMANCE STATUS: 1 - Symptomatic but completely ambulatory  . Vitals:   11/10/18 1141  BP: 112/68  Pulse: 74  Resp: 18  Temp: 98.9 F (37.2 C)  SpO2: 100%   Filed Weights   11/10/18 1141  Weight: 173 lb 1.6 oz (78.5 kg)   .Body mass index is  27.11 kg/m.  GENERAL:alert, in no acute distress and comfortable SKIN: no acute rashes, no significant lesions EYES: conjunctiva are pink and non-injected, sclera anicteric OROPHARYNX: MMM, no exudates,  no oropharyngeal erythema or ulceration NECK: supple, no JVD LYMPH:  no palpable lymphadenopathy in the cervical, axillary or inguinal regions LUNGS: clear to auscultation b/l with normal respiratory effort HEART: regular rate & rhythm ABDOMEN:  normoactive bowel sounds , non tender, not distended. No palpable hepatosplenomegaly.  Extremity: no pedal edema PSYCH: alert & oriented x 3 with fluent speech NEURO: no focal motor/sensory deficits   LABORATORY DATA:  I have reviewed the data as listed  . CBC Latest Ref Rng & Units 11/10/2018 11/03/2018 10/07/2018  WBC 4.0 - 10.5 K/uL 7.9 7.5 7.1  Hemoglobin 12.0 - 15.0 g/dL 12.9 12.5 11.7(L)  Hematocrit 36.0 - 46.0 % 38.4 38.2 35.1(L)  Platelets 150 - 400 K/uL 373 299 451(H)    . CMP Latest Ref Rng & Units 11/10/2018 11/03/2018 10/07/2018  Glucose 70 - 99 mg/dL 112(H) 120(H) 128(H)  BUN 6 - 20 mg/dL 16 12 7   Creatinine 0.44 - 1.00 mg/dL 0.90 0.90 0.81  Sodium 135 - 145 mmol/L 140 139 142  Potassium 3.5 - 5.1 mmol/L 4.0 3.5 2.7(LL)  Chloride 98 - 111 mmol/L 103 105 103  CO2 22 - 32 mmol/L 22 25 27   Calcium 8.9 - 10.3 mg/dL 10.2 9.9 9.8  Total Protein 6.5 - 8.1 g/dL 7.5 7.2 7.4  Total Bilirubin 0.3 - 1.2 mg/dL 0.3 0.3 0.2(L)  Alkaline Phos 38 - 126 U/L 50 52 67  AST 15 - 41 U/L 16 16 18   ALT 0 - 44 U/L 16 19 12     09/12/18 BM Biopsy:    07/28/18 Right cerebellar hemisphere and dura biopsy:     RADIOGRAPHIC STUDIES: I have personally reviewed the radiological images as listed and agreed with the findings in the report. Mr Jeri Cos Wo Contrast  Result Date: 10/17/2018 CLINICAL DATA:  CNS lymphoma EXAM: MRI HEAD WITHOUT AND WITH CONTRAST TECHNIQUE: Multiplanar, multiecho pulse sequences of the brain and surrounding structures were  obtained without and with intravenous contrast. CONTRAST:  7 cc Gadavist intravenous COMPARISON:  07/21/2018 FINDINGS: Brain: At site of prior dural mass in the right posterior fossa there is now very mild and smooth dural thickening which may be treatment related. Unremarkable craniectomy and cranioplasty in the right occipital bone. No new site of abnormal or masslike enhancement. Cerebellar edema and mass effect is resolved. No infarct, hemorrhage, hydrocephalus, or collection. 6 mm colloid cyst. Vascular: Major flow voids and vascular enhancements are preserved Skull and upper cervical spine: Unremarkable right occipital cranioplasty. Sinuses/Orbits: Negative IMPRESSION: 1. Resolved dural mass in the right posterior fossa with minimal smooth dural thickening that may be treatment related. Cerebellar edema and mass effect is also resolved. No new site of disease. 2. 6 mm colloid cyst. Electronically Signed   By: Monte Fantasia M.D.   On: 10/17/2018 06:39   Mr Cervical Spine W Wo Contrast  Result Date: 10/21/2018 CLINICAL DATA:  History of primary CNS lymphoma. Staging examination. EXAM: MRI CERVICAL SPINE WITHOUT AND WITH CONTRAST TECHNIQUE: Multiplanar and multiecho pulse sequences of the cervical spine, to include the craniocervical junction and cervicothoracic junction, were obtained without and with intravenous contrast. CONTRAST:  8 cc Gadavist IV. COMPARISON:  Brain MRI 10/16/2018 and 07/21/2018. PET CT scan 09/08/2018. FINDINGS: Alignment: Normal. Vertebrae: No fracture, evidence of discitis, or bone lesion. Cord: Normal signal throughout. No dural thickening or enhancement is identified. Posterior Fossa, vertebral arteries, paraspinal tissues: Right occipital craniectomy defect noted. Otherwise negative. Disc levels: C2-3: Negative. C3-4: Minimal disc bulge without stenosis. C4-5: Shallow  disc bulge without stenosis. C5-6: Central disc protrusion effaces the ventral thecal sac and causes mild central  canal narrowing. Foramina are open. C6-7: Shallow disc bulge nearly effaces the ventral thecal sac. Foramina are open. C7-T1: Negative. IMPRESSION: Negative for lymphoma.  No acute abnormality. Central disc protrusion at C5-6 effaces the ventral thecal sac causing mild central canal narrowing. There is also a shallow disc bulge at C6-7 which narrows but does not efface the ventral thecal sac. Electronically Signed   By: Inge Rise M.D.   On: 10/21/2018 08:27   Dg Fl Guided Lumbar Puncture  Result Date: 10/22/2018 CLINICAL DATA:  CNS lymphoma. EXAM: DIAGNOSTIC LUMBAR PUNCTURE UNDER FLUOROSCOPIC GUIDANCE FLUOROSCOPY TIME:  Fluoroscopy Time:  18 seconds Radiation Exposure Index (if provided by the fluoroscopic device): 1.7 mGy Number of Acquired Spot Images: 0 PROCEDURE: Informed consent was obtained from the patient prior to the procedure, including potential complications of headache, allergy, and pain. With the patient prone, the lower back was prepped with Betadine. 1% Lidocaine was used for local anesthesia. Lumbar puncture was performed at the L3-4 level using a 20 gauge needle with return of clear CSF with an opening pressure of 15 cm water. 9 ml of CSF were obtained for laboratory studies. The patient tolerated the procedure well and there were no apparent complications. IMPRESSION: Technically successful lumbar puncture as above. No immediate complication. Electronically Signed   By: Rolm Baptise M.D.   On: 10/22/2018 11:47    ASSESSMENT & PLAN:  58 y.o. female with  1. 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 The process appears to extend into the upper cervical canal. Mild obstructive hydrocephalus. Differential includes tumor including metastatic disease and lymphoma. Chronic inflammatory process such as sarcoid is a consideration however chest x-ray negative. TB and atypical infection/fungus also possible. 6 mm colloid cyst felt to be a separate problem."  07/25/18 CT C/A/P which did not reveal significant abnormality  09/12/18 BM Bx revealed no evidence of lymphoma  09/08/18 PET/CT revealed "No hypermetabolic mass or adenopathy identified within the chest abdomen or pelvis to suggest metabolically active tumor. 2. Nonspecific focus of increased uptake within the cord extending from T12 to L1. Cannot rule out additional site of CNS lymphoma. Consider further evaluation with contrast enhanced MRI through this area. 3. Aortic Atherosclerosis and Emphysema."  10/16/2018 MRI brain w and w/o contrast revealed "1. Resolved dural mass in the right posterior fossa with minimal smooth dural thickening that may be treatment related. Cerebellar edema and mass effect is also resolved. No new site of disease. 2. 6 mm colloid cyst."  10/20/2018 MRI cervical spine w and w/o contrast revealed "Negative for lymphoma. No acute abnormality. Central disc protrusion at C5-6 effaces the ventral thecal sac causing mild central canal narrowing. There is also a shallow disc bulge at C6-7 which narrows but does not efface the ventral thecal Sac."  PLAN -Discussed pt labwork today, 11/10/2018; blood counts and blood chemistries are  -no prohibitive toxicities from 1st dose of Rituxan -Continue weekly Rituxan for total of 4 weeks with same premedications. Afterwards, will plan for maintenance Rituxan every 2 months  for 1-2 years -Will plan to taper off of Dexamethasone after pt completes 4 weekly doses of Rituxan -Will repeat MRI every 4-6 months or if new  symptoms arise -Will see the pt back in 2 wks   -f/u for labs and Rituxan on 8/17 -may remove MD visit for 8/17 -Plz schedule 4th dose of Rituxan with labs and MD visit on 8/24   All of the patients questions were answered with apparent satisfaction. The patient knows to call the clinic with any problems, questions or concerns.  The total time spent in the appt was 25 minutes and more than 50% was on counseling and direct patient cares.   Sullivan Lone MD MS AAHIVMS [facility] Hematology/Oncology Physician [facility]  (Office):       [phone] (Work cell):  [phone] (Fax):           [phone]  11/10/2018 12:13 PM  I, De Burrs, am acting as a scribe for Dr. Irene Limbo  .I have reviewed the above documentation for accuracy and completeness, and I agree with the above. Brunetta Genera MD

## 2018-11-10 ENCOUNTER — Inpatient Hospital Stay (HOSPITAL_BASED_OUTPATIENT_CLINIC_OR_DEPARTMENT_OTHER): Payer: BC Managed Care – PPO | Admitting: Hematology

## 2018-11-10 ENCOUNTER — Other Ambulatory Visit: Payer: Self-pay

## 2018-11-10 ENCOUNTER — Inpatient Hospital Stay: Payer: BC Managed Care – PPO

## 2018-11-10 ENCOUNTER — Other Ambulatory Visit: Payer: BC Managed Care – PPO

## 2018-11-10 VITALS — BP 112/68 | HR 74 | Temp 98.9°F | Resp 18 | Ht 67.0 in | Wt 173.1 lb

## 2018-11-10 VITALS — BP 107/64 | HR 78 | Temp 98.1°F | Resp 18

## 2018-11-10 DIAGNOSIS — I10 Essential (primary) hypertension: Secondary | ICD-10-CM | POA: Diagnosis not present

## 2018-11-10 DIAGNOSIS — F17210 Nicotine dependence, cigarettes, uncomplicated: Secondary | ICD-10-CM | POA: Diagnosis not present

## 2018-11-10 DIAGNOSIS — Z7189 Other specified counseling: Secondary | ICD-10-CM

## 2018-11-10 DIAGNOSIS — Z5112 Encounter for antineoplastic immunotherapy: Secondary | ICD-10-CM | POA: Diagnosis not present

## 2018-11-10 DIAGNOSIS — M1990 Unspecified osteoarthritis, unspecified site: Secondary | ICD-10-CM | POA: Diagnosis not present

## 2018-11-10 DIAGNOSIS — Z79899 Other long term (current) drug therapy: Secondary | ICD-10-CM | POA: Diagnosis not present

## 2018-11-10 DIAGNOSIS — C884 Extranodal marginal zone B-cell lymphoma of mucosa-associated lymphoid tissue [MALT-lymphoma]: Secondary | ICD-10-CM | POA: Diagnosis not present

## 2018-11-10 DIAGNOSIS — D573 Sickle-cell trait: Secondary | ICD-10-CM | POA: Diagnosis not present

## 2018-11-10 DIAGNOSIS — E785 Hyperlipidemia, unspecified: Secondary | ICD-10-CM | POA: Diagnosis not present

## 2018-11-10 LAB — CBC WITH DIFFERENTIAL/PLATELET
Abs Immature Granulocytes: 0.05 10*3/uL (ref 0.00–0.07)
Basophils Absolute: 0 10*3/uL (ref 0.0–0.1)
Basophils Relative: 0 %
Eosinophils Absolute: 0 10*3/uL (ref 0.0–0.5)
Eosinophils Relative: 0 %
HCT: 38.4 % (ref 36.0–46.0)
Hemoglobin: 12.9 g/dL (ref 12.0–15.0)
Immature Granulocytes: 1 %
Lymphocytes Relative: 11 %
Lymphs Abs: 0.9 10*3/uL (ref 0.7–4.0)
MCH: 29.9 pg (ref 26.0–34.0)
MCHC: 33.6 g/dL (ref 30.0–36.0)
MCV: 88.9 fL (ref 80.0–100.0)
Monocytes Absolute: 0.3 10*3/uL (ref 0.1–1.0)
Monocytes Relative: 4 %
Neutro Abs: 6.7 10*3/uL (ref 1.7–7.7)
Neutrophils Relative %: 84 %
Platelets: 373 10*3/uL (ref 150–400)
RBC: 4.32 MIL/uL (ref 3.87–5.11)
RDW: 13.3 % (ref 11.5–15.5)
WBC: 7.9 10*3/uL (ref 4.0–10.5)
nRBC: 0 % (ref 0.0–0.2)

## 2018-11-10 LAB — CMP (CANCER CENTER ONLY)
ALT: 16 U/L (ref 0–44)
AST: 16 U/L (ref 15–41)
Albumin: 3.8 g/dL (ref 3.5–5.0)
Alkaline Phosphatase: 50 U/L (ref 38–126)
Anion gap: 15 (ref 5–15)
BUN: 16 mg/dL (ref 6–20)
CO2: 22 mmol/L (ref 22–32)
Calcium: 10.2 mg/dL (ref 8.9–10.3)
Chloride: 103 mmol/L (ref 98–111)
Creatinine: 0.9 mg/dL (ref 0.44–1.00)
GFR, Est AFR Am: >60 mL/min (ref >60)
GFR, Estimated: >60 mL/min (ref >60)
Glucose, Bld: 112 mg/dL — ABNORMAL HIGH (ref 70–99)
Potassium: 4 mmol/L (ref 3.5–5.1)
Sodium: 140 mmol/L (ref 135–145)
Total Bilirubin: 0.3 mg/dL (ref 0.3–1.2)
Total Protein: 7.5 g/dL (ref 6.5–8.1)

## 2018-11-10 MED ORDER — DIPHENHYDRAMINE HCL 25 MG PO CAPS
50.0000 mg | ORAL_CAPSULE | Freq: Once | ORAL | Status: AC
  Administered 2018-11-10: 50 mg via ORAL

## 2018-11-10 MED ORDER — ACETAMINOPHEN 325 MG PO TABS
ORAL_TABLET | ORAL | Status: AC
  Filled 2018-11-10: qty 2

## 2018-11-10 MED ORDER — DEXAMETHASONE SODIUM PHOSPHATE 10 MG/ML IJ SOLN
10.0000 mg | Freq: Once | INTRAMUSCULAR | Status: AC
  Administered 2018-11-10: 10 mg via INTRAVENOUS

## 2018-11-10 MED ORDER — DEXAMETHASONE 1 MG PO TABS: 1.0000 mg | ORAL_TABLET | Freq: Every day | ORAL | 0 refills | Status: DC

## 2018-11-10 MED ORDER — RITUXIMAB-PVVR CHEMO 500 MG/50ML IV SOLN: 375.0000 mg/m2 | Freq: Once | INTRAVENOUS | Status: DC

## 2018-11-10 MED ORDER — FAMOTIDINE IN NACL 20-0.9 MG/50ML-% IV SOLN
INTRAVENOUS | Status: AC
  Filled 2018-11-10: qty 50

## 2018-11-10 MED ORDER — DEXAMETHASONE SODIUM PHOSPHATE 10 MG/ML IJ SOLN
INTRAMUSCULAR | Status: AC
  Filled 2018-11-10: qty 1

## 2018-11-10 MED ORDER — FAMOTIDINE IN NACL 20-0.9 MG/50ML-% IV SOLN
20.0000 mg | Freq: Once | INTRAVENOUS | Status: AC
  Administered 2018-11-10: 20 mg via INTRAVENOUS

## 2018-11-10 MED ORDER — DIPHENHYDRAMINE HCL 25 MG PO CAPS
ORAL_CAPSULE | ORAL | Status: AC
  Filled 2018-11-10: qty 2

## 2018-11-10 MED ORDER — SODIUM CHLORIDE 0.9 % IV SOLN
Freq: Once | INTRAVENOUS | Status: AC
  Administered 2018-11-10: 13:00:00 via INTRAVENOUS
  Filled 2018-11-10: qty 250

## 2018-11-10 MED ORDER — SODIUM CHLORIDE 0.9 % IV SOLN
700.0000 mg | Freq: Once | INTRAVENOUS | Status: AC
  Administered 2018-11-10: 700 mg via INTRAVENOUS
  Filled 2018-11-10: qty 180

## 2018-11-10 MED ORDER — ACETAMINOPHEN 325 MG PO TABS
650.0000 mg | ORAL_TABLET | Freq: Once | ORAL | Status: AC
  Administered 2018-11-10: 650 mg via ORAL

## 2018-11-10 NOTE — Patient Instructions (Addendum)
Rituximab injection What is this medicine? RITUXIMAB (ri TUX i mab) is a monoclonal antibody. It is used to treat certain types of cancer like non-Hodgkin lymphoma and chronic lymphocytic leukemia. It is also used to treat rheumatoid arthritis, granulomatosis with polyangiitis (or Wegener's granulomatosis), microscopic polyangiitis, and pemphigus vulgaris. This medicine may be used for other purposes; ask your health care provider or pharmacist if you have questions. COMMON BRAND NAME(S): Rituxan, RUXIENCE What should I tell my health care provider before I take this medicine? They need to know if you have any of these conditions:  heart disease  infection (especially a virus infection such as hepatitis B, chickenpox, cold sores, or herpes)  immune system problems  irregular heartbeat  kidney disease  low blood counts, like low white cell, platelet, or red cell counts  lung or breathing disease, like asthma  recently received or scheduled to receive a vaccine  an unusual or allergic reaction to rituximab, other medicines, foods, dyes, or preservatives  pregnant or trying to get pregnant  breast-feeding How should I use this medicine? This medicine is for infusion into a vein. It is administered in a hospital or clinic by a specially trained health care professional. A special MedGuide will be given to you by the pharmacist with each prescription and refill. Be sure to read this information carefully each time. Talk to your pediatrician regarding the use of this medicine in children. This medicine is not approved for use in children. Overdosage: If you think you have taken too much of this medicine contact a poison control center or emergency room at once. NOTE: This medicine is only for you. Do not share this medicine with others. What if I miss a dose? It is important not to miss a dose. Call your doctor or health care professional if you are unable to keep an appointment. What  may interact with this medicine?  cisplatin  live virus vaccines This list may not describe all possible interactions. Give your health care provider a list of all the medicines, herbs, non-prescription drugs, or dietary supplements you use. Also tell them if you smoke, drink alcohol, or use illegal drugs. Some items may interact with your medicine. What should I watch for while using this medicine? Your condition will be monitored carefully while you are receiving this medicine. You may need blood work done while you are taking this medicine. This medicine can cause serious allergic reactions. To reduce your risk you may need to take medicine before treatment with this medicine. Take your medicine as directed. In some patients, this medicine may cause a serious brain infection that may cause death. If you have any problems seeing, thinking, speaking, walking, or standing, tell your healthcare professional right away. If you cannot reach your healthcare professional, urgently seek other source of medical care. Call your doctor or health care professional for advice if you get a fever, chills or sore throat, or other symptoms of a cold or flu. Do not treat yourself. This drug decreases your body's ability to fight infections. Try to avoid being around people who are sick. Do not become pregnant while taking this medicine or for at least 12 months after stopping it. Women should inform their doctor if they wish to become pregnant or think they might be pregnant. There is a potential for serious side effects to an unborn child. Talk to your health care professional or pharmacist for more information. Do not breast-feed an infant while taking this medicine or for at   least 6 months after stopping it. What side effects may I notice from receiving this medicine? Side effects that you should report to your doctor or health care professional as soon as possible:  allergic reactions like skin rash, itching or  hives; swelling of the face, lips, or tongue  breathing problems  chest pain  changes in vision  diarrhea  headache with fever, neck stiffness, sensitivity to light, nausea, or confusion  fast, irregular heartbeat  loss of memory  low blood counts - this medicine may decrease the number of white blood cells, red blood cells and platelets. You may be at increased risk for infections and bleeding.  mouth sores  problems with balance, talking, or walking  redness, blistering, peeling or loosening of the skin, including inside the mouth  signs of infection - fever or chills, cough, sore throat, pain or difficulty passing urine  signs and symptoms of kidney injury like trouble passing urine or change in the amount of urine  signs and symptoms of liver injury like dark yellow or brown urine; general ill feeling or flu-like symptoms; light-colored stools; loss of appetite; nausea; right upper belly pain; unusually weak or tired; yellowing of the eyes or skin  signs and symptoms of low blood pressure like dizziness; feeling faint or lightheaded, falls; unusually weak or tired  stomach pain  swelling of the ankles, feet, hands  unusual bleeding or bruising  vomiting Side effects that usually do not require medical attention (report to your doctor or health care professional if they continue or are bothersome):  headache  joint pain  muscle cramps or muscle pain  nausea  tiredness This list may not describe all possible side effects. Call your doctor for medical advice about side effects. You may report side effects to FDA at 1-800-FDA-1088. Where should I keep my medicine? This drug is given in a hospital or clinic and will not be stored at home. NOTE: This sheet is a summary. It may not cover all possible information. If you have questions about this medicine, talk to your doctor, pharmacist, or health care provider.  2020 Elsevier/Gold Standard (2018-04-30  22:01:36)   [facility] Discharge Instructions for Patients Receiving Chemotherapy  Today you received the following chemotherapy agents: Ruxience.  To help prevent nausea and vomiting after your treatment, we encourage you to take your nausea medication as directed.   If you develop nausea and vomiting that is not controlled by your nausea medication, call the clinic.   BELOW ARE SYMPTOMS THAT SHOULD BE REPORTED IMMEDIATELY:  *FEVER GREATER THAN 100.5 F  *CHILLS WITH OR WITHOUT FEVER  NAUSEA AND VOMITING THAT IS NOT CONTROLLED WITH YOUR NAUSEA MEDICATION  *UNUSUAL SHORTNESS OF BREATH  *UNUSUAL BRUISING OR BLEEDING  TENDERNESS IN MOUTH AND THROAT WITH OR WITHOUT PRESENCE OF ULCERS  *URINARY PROBLEMS  *BOWEL PROBLEMS  UNUSUAL RASH Items with * indicate a potential emergency and should be followed up as soon as possible.  Feel free to call the clinic should you have any questions or concerns. The clinic phone number is [phone].  Please show the [facility] at check-in to the Emergency Department and triage nurse.

## 2018-11-11 ENCOUNTER — Telehealth: Payer: Self-pay | Admitting: Hematology

## 2018-11-11 NOTE — Telephone Encounter (Signed)
I talk with patient regarding schedule  

## 2018-11-17 ENCOUNTER — Ambulatory Visit: Payer: BC Managed Care – PPO | Admitting: Hematology

## 2018-11-17 ENCOUNTER — Other Ambulatory Visit: Payer: Self-pay

## 2018-11-17 ENCOUNTER — Other Ambulatory Visit: Payer: BC Managed Care – PPO

## 2018-11-17 ENCOUNTER — Inpatient Hospital Stay: Payer: BC Managed Care – PPO

## 2018-11-17 ENCOUNTER — Other Ambulatory Visit: Payer: Self-pay | Admitting: Hematology

## 2018-11-17 VITALS — BP 128/51 | HR 61 | Temp 97.9°F | Resp 18 | Wt 173.2 lb

## 2018-11-17 DIAGNOSIS — Z79899 Other long term (current) drug therapy: Secondary | ICD-10-CM | POA: Diagnosis not present

## 2018-11-17 DIAGNOSIS — C884 Extranodal marginal zone B-cell lymphoma of mucosa-associated lymphoid tissue [MALT-lymphoma]: Secondary | ICD-10-CM | POA: Diagnosis not present

## 2018-11-17 DIAGNOSIS — F17210 Nicotine dependence, cigarettes, uncomplicated: Secondary | ICD-10-CM | POA: Diagnosis not present

## 2018-11-17 DIAGNOSIS — Z5112 Encounter for antineoplastic immunotherapy: Secondary | ICD-10-CM | POA: Diagnosis not present

## 2018-11-17 DIAGNOSIS — E785 Hyperlipidemia, unspecified: Secondary | ICD-10-CM | POA: Diagnosis not present

## 2018-11-17 DIAGNOSIS — I10 Essential (primary) hypertension: Secondary | ICD-10-CM | POA: Diagnosis not present

## 2018-11-17 DIAGNOSIS — D573 Sickle-cell trait: Secondary | ICD-10-CM | POA: Diagnosis not present

## 2018-11-17 DIAGNOSIS — M1990 Unspecified osteoarthritis, unspecified site: Secondary | ICD-10-CM | POA: Diagnosis not present

## 2018-11-17 DIAGNOSIS — Z7189 Other specified counseling: Secondary | ICD-10-CM

## 2018-11-17 LAB — CBC WITH DIFFERENTIAL/PLATELET
Abs Immature Granulocytes: 0.04 10*3/uL (ref 0.00–0.07)
Basophils Absolute: 0 10*3/uL (ref 0.0–0.1)
Basophils Relative: 0 %
Eosinophils Absolute: 0 10*3/uL (ref 0.0–0.5)
Eosinophils Relative: 0 %
HCT: 39.3 % (ref 36.0–46.0)
Hemoglobin: 13 g/dL (ref 12.0–15.0)
Immature Granulocytes: 0 %
Lymphocytes Relative: 13 %
Lymphs Abs: 1.3 10*3/uL (ref 0.7–4.0)
MCH: 29.4 pg (ref 26.0–34.0)
MCHC: 33.1 g/dL (ref 30.0–36.0)
MCV: 88.9 fL (ref 80.0–100.0)
Monocytes Absolute: 0.5 10*3/uL (ref 0.1–1.0)
Monocytes Relative: 5 %
Neutro Abs: 8.2 10*3/uL — ABNORMAL HIGH (ref 1.7–7.7)
Neutrophils Relative %: 82 %
Platelets: 370 10*3/uL (ref 150–400)
RBC: 4.42 MIL/uL (ref 3.87–5.11)
RDW: 13.3 % (ref 11.5–15.5)
WBC: 10 10*3/uL (ref 4.0–10.5)
nRBC: 0 % (ref 0.0–0.2)

## 2018-11-17 LAB — CMP (CANCER CENTER ONLY)
ALT: 19 U/L (ref 0–44)
AST: 17 U/L (ref 15–41)
Albumin: 3.9 g/dL (ref 3.5–5.0)
Alkaline Phosphatase: 47 U/L (ref 38–126)
Anion gap: 10 (ref 5–15)
BUN: 15 mg/dL (ref 6–20)
CO2: 24 mmol/L (ref 22–32)
Calcium: 9.6 mg/dL (ref 8.9–10.3)
Chloride: 106 mmol/L (ref 98–111)
Creatinine: 0.84 mg/dL (ref 0.44–1.00)
GFR, Est AFR Am: >60 mL/min (ref >60)
GFR, Estimated: >60 mL/min (ref >60)
Glucose, Bld: 92 mg/dL (ref 70–99)
Potassium: 3.2 mmol/L — ABNORMAL LOW (ref 3.5–5.1)
Sodium: 140 mmol/L (ref 135–145)
Total Bilirubin: 0.3 mg/dL (ref 0.3–1.2)
Total Protein: 7.4 g/dL (ref 6.5–8.1)

## 2018-11-17 MED ORDER — FAMOTIDINE IN NACL 20-0.9 MG/50ML-% IV SOLN
INTRAVENOUS | Status: AC
  Filled 2018-11-17: qty 50

## 2018-11-17 MED ORDER — DEXAMETHASONE SODIUM PHOSPHATE 10 MG/ML IJ SOLN
10.0000 mg | Freq: Once | INTRAMUSCULAR | Status: AC
  Administered 2018-11-17: 13:00:00 10 mg via INTRAVENOUS

## 2018-11-17 MED ORDER — FAMOTIDINE IN NACL 20-0.9 MG/50ML-% IV SOLN
20.0000 mg | Freq: Once | INTRAVENOUS | Status: AC
  Administered 2018-11-17: 20 mg via INTRAVENOUS

## 2018-11-17 MED ORDER — DEXAMETHASONE 1 MG PO TABS: ORAL_TABLET | ORAL | 0 refills | Status: DC

## 2018-11-17 MED ORDER — DEXAMETHASONE SODIUM PHOSPHATE 10 MG/ML IJ SOLN
INTRAMUSCULAR | Status: AC
  Filled 2018-11-17: qty 1

## 2018-11-17 MED ORDER — SODIUM CHLORIDE 0.9 % IV SOLN
Freq: Once | INTRAVENOUS | Status: AC
  Administered 2018-11-17: 14:00:00 via INTRAVENOUS
  Filled 2018-11-17: qty 180

## 2018-11-17 MED ORDER — DIPHENHYDRAMINE HCL 25 MG PO CAPS
ORAL_CAPSULE | ORAL | Status: AC
  Filled 2018-11-17: qty 2

## 2018-11-17 MED ORDER — DIPHENHYDRAMINE HCL 25 MG PO CAPS
50.0000 mg | ORAL_CAPSULE | Freq: Once | ORAL | Status: AC
  Administered 2018-11-17: 13:00:00 50 mg via ORAL

## 2018-11-17 MED ORDER — SODIUM CHLORIDE 0.9 % IV SOLN
Freq: Once | INTRAVENOUS | Status: AC
  Administered 2018-11-17: 13:00:00 via INTRAVENOUS
  Filled 2018-11-17: qty 250

## 2018-11-17 MED ORDER — ACETAMINOPHEN 325 MG PO TABS
ORAL_TABLET | ORAL | Status: AC
  Filled 2018-11-17: qty 2

## 2018-11-17 MED ORDER — ACETAMINOPHEN 325 MG PO TABS
650.0000 mg | ORAL_TABLET | Freq: Once | ORAL | Status: AC
  Administered 2018-11-17: 13:00:00 650 mg via ORAL

## 2018-11-17 NOTE — Progress Notes (Signed)
Patient to receive rituximab at standard rapid rituximab rate.   Demetrius Charity, PharmD, [facility] Phone: [phone] 11/17/2018

## 2018-11-17 NOTE — Patient Instructions (Signed)
Rituximab injection What is this medicine? RITUXIMAB (ri TUX i mab) is a monoclonal antibody. It is used to treat certain types of cancer like non-Hodgkin lymphoma and chronic lymphocytic leukemia. It is also used to treat rheumatoid arthritis, granulomatosis with polyangiitis (or Wegener's granulomatosis), microscopic polyangiitis, and pemphigus vulgaris. This medicine may be used for other purposes; ask your health care provider or pharmacist if you have questions. COMMON BRAND NAME(S): Rituxan, RUXIENCE What should I tell my health care provider before I take this medicine? They need to know if you have any of these conditions:  heart disease  infection (especially a virus infection such as hepatitis B, chickenpox, cold sores, or herpes)  immune system problems  irregular heartbeat  kidney disease  low blood counts, like low white cell, platelet, or red cell counts  lung or breathing disease, like asthma  recently received or scheduled to receive a vaccine  an unusual or allergic reaction to rituximab, other medicines, foods, dyes, or preservatives  pregnant or trying to get pregnant  breast-feeding How should I use this medicine? This medicine is for infusion into a vein. It is administered in a hospital or clinic by a specially trained health care professional. A special MedGuide will be given to you by the pharmacist with each prescription and refill. Be sure to read this information carefully each time. Talk to your pediatrician regarding the use of this medicine in children. This medicine is not approved for use in children. Overdosage: If you think you have taken too much of this medicine contact a poison control center or emergency room at once. NOTE: This medicine is only for you. Do not share this medicine with others. What if I miss a dose? It is important not to miss a dose. Call your doctor or health care professional if you are unable to keep an appointment. What  may interact with this medicine?  cisplatin  live virus vaccines This list may not describe all possible interactions. Give your health care provider a list of all the medicines, herbs, non-prescription drugs, or dietary supplements you use. Also tell them if you smoke, drink alcohol, or use illegal drugs. Some items may interact with your medicine. What should I watch for while using this medicine? Your condition will be monitored carefully while you are receiving this medicine. You may need blood work done while you are taking this medicine. This medicine can cause serious allergic reactions. To reduce your risk you may need to take medicine before treatment with this medicine. Take your medicine as directed. In some patients, this medicine may cause a serious brain infection that may cause death. If you have any problems seeing, thinking, speaking, walking, or standing, tell your healthcare professional right away. If you cannot reach your healthcare professional, urgently seek other source of medical care. Call your doctor or health care professional for advice if you get a fever, chills or sore throat, or other symptoms of a cold or flu. Do not treat yourself. This drug decreases your body's ability to fight infections. Try to avoid being around people who are sick. Do not become pregnant while taking this medicine or for at least 12 months after stopping it. Women should inform their doctor if they wish to become pregnant or think they might be pregnant. There is a potential for serious side effects to an unborn child. Talk to your health care professional or pharmacist for more information. Do not breast-feed an infant while taking this medicine or for at   least 6 months after stopping it. What side effects may I notice from receiving this medicine? Side effects that you should report to your doctor or health care professional as soon as possible:  allergic reactions like skin rash, itching or  hives; swelling of the face, lips, or tongue  breathing problems  chest pain  changes in vision  diarrhea  headache with fever, neck stiffness, sensitivity to light, nausea, or confusion  fast, irregular heartbeat  loss of memory  low blood counts - this medicine may decrease the number of white blood cells, red blood cells and platelets. You may be at increased risk for infections and bleeding.  mouth sores  problems with balance, talking, or walking  redness, blistering, peeling or loosening of the skin, including inside the mouth  signs of infection - fever or chills, cough, sore throat, pain or difficulty passing urine  signs and symptoms of kidney injury like trouble passing urine or change in the amount of urine  signs and symptoms of liver injury like dark yellow or brown urine; general ill feeling or flu-like symptoms; light-colored stools; loss of appetite; nausea; right upper belly pain; unusually weak or tired; yellowing of the eyes or skin  signs and symptoms of low blood pressure like dizziness; feeling faint or lightheaded, falls; unusually weak or tired  stomach pain  swelling of the ankles, feet, hands  unusual bleeding or bruising  vomiting Side effects that usually do not require medical attention (report to your doctor or health care professional if they continue or are bothersome):  headache  joint pain  muscle cramps or muscle pain  nausea  tiredness This list may not describe all possible side effects. Call your doctor for medical advice about side effects. You may report side effects to FDA at 1-800-FDA-1088. Where should I keep my medicine? This drug is given in a hospital or clinic and will not be stored at home. NOTE: This sheet is a summary. It may not cover all possible information. If you have questions about this medicine, talk to your doctor, pharmacist, or health care provider.  2020 Elsevier/Gold Standard (2018-04-30  22:01:36)   [facility] Discharge Instructions for Patients Receiving Chemotherapy  Today you received the following chemotherapy agents: Ruxience.  To help prevent nausea and vomiting after your treatment, we encourage you to take your nausea medication as directed.   If you develop nausea and vomiting that is not controlled by your nausea medication, call the clinic.   BELOW ARE SYMPTOMS THAT SHOULD BE REPORTED IMMEDIATELY:  *FEVER GREATER THAN 100.5 F  *CHILLS WITH OR WITHOUT FEVER  NAUSEA AND VOMITING THAT IS NOT CONTROLLED WITH YOUR NAUSEA MEDICATION  *UNUSUAL SHORTNESS OF BREATH  *UNUSUAL BRUISING OR BLEEDING  TENDERNESS IN MOUTH AND THROAT WITH OR WITHOUT PRESENCE OF ULCERS  *URINARY PROBLEMS  *BOWEL PROBLEMS  UNUSUAL RASH Items with * indicate a potential emergency and should be followed up as soon as possible.  Feel free to call the clinic should you have any questions or concerns. The clinic phone number is [phone].  Please show the [facility] at check-in to the Emergency Department and triage nurse.

## 2018-11-24 NOTE — Progress Notes (Signed)
HEMATOLOGY/ONCOLOGY CLINIC NOTE  Date of Service: 11/25/2018  Patient Care Team: [name], MD as PCP - General (Internal Medicine)  CHIEF COMPLAINTS/PURPOSE OF CONSULTATION:  Newly Diagnosed Extranodal Marginal Zone Lymphoma  HISTORY OF PRESENTING ILLNESS:   Amber Drake is a wonderful 58 y.o. female who has been referred to [place] by my colleague Dr. Cecil Cobbs in Neuro-Oncology for evaluation and management of her Newly diagnosed Extranodal Marginal Zone Lymphoma. She is accompanied today by her sister [name] via [employer]. The pt reports that she is doing well overall.  Prior to today's visit, the pt presented to the ED on 07/21/18 with complaints of a headache for the past month which presented intermittently and was worsened by bending, coughing, or laughing. She had a CT Head and MRI Brain which revealed extensive dural thickening, as noted below. She then had a right posterior fossa craniectomy on 07/28/18 for open exicisional biopsies of the right cerebellar hemisphere and dura which revealed Extranodal marginal zone lymphoma. The pt then established care with my colleague Dr. Cecil Cobbs in Neuro-Onc on 08/11/18. She has been taking 4mg  Decadron daily.  The pt reports that developed worsened headaches about a month ago, and notes that when she laughed it felt like "electiricty" was running through the sides and back of her head. She notes that some mild headaches had begun a couple months ago. Denies double vision, changes in vision, loss of balance, or other senses being affected. The pt notes that in addition to this, she developed redness in the eyes in the past couple months, saw eye doctor and was prescribed eye drops. Denies other concerns in the last 6 months including bone pains, fatigue, fevers, chills, night sweats or unexpected weight loss. She notes that her headaches have resolved. She notes she began Decadron after her biopsy. She denies having "any symptoms  whatsoever," currently.   Of note prior to the patient's visit today, pt has had an MRI Brain completed on 07/21/18 with results revealing "Extensive dural thickening surrounding the right cerebellar hemisphere with mass-effect and edema in the right cerebellum. The process appears to extend into the upper cervical canal. Mild obstructive hydrocephalus. Differential includes tumor including metastatic disease and lymphoma. Chronic inflammatory process such as sarcoid is a consideration however chest x-ray negative. TB and atypical infection/fungus also possible. 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  On review of systems, pt reports good energy levels, headache resolution, and denies joint issues, skin rashes, dry mouth, dry eyes, fevers, chills, night sweats, bone pains, unexpected weight loss, fatigue, double vision, loss of vision, affected senses, loss of balance, and any other symptoms.   On PMHx the pt reports sickle cel trait, HTN, hyperlipidemia. On Social Hx the pt reports that she smoked 1 ppd for the last 42 years, and reports having quit two days ago. 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 On Family Hx the pt reports dad with lupus and sister with sarcoidosis. Daughter and son with sickle cell disease. Denies other blood disorders or cancers. Endorses Penicillin allergy  Interval History:   Amber Drake returns today for management and evaluation of her CNS Extranodal Marginal Zone Lymphoma. She  is here for a toxicity check after fourth dose of Rituxan. This is her last planned dose at this time. The patient's last visit with [place] was on 11/10/2018. The pt reports that she is doing well overall.  The pt reports that  she is tolerating the Tx well. She is eating well and drinking a lot of water. Pt has gained about 7 lbs.  Pt has been experiencing acid reflux for which she is taking TUMS, which is helping. Pt reports that she is taking Dexamethasone every other day at this time.   Lab results today (11/25/18) of CBC w/diff and CMP is as follows: all values are WNL except for Albumin at 3.4, AST at 14.   On review of systems, pt reports reflux  and denies headaches, nausea, neck stiffness, issues with balance, leg swelling, abdominal pian and any other symptoms.   MEDICAL HISTORY:  Past Medical History:  Diagnosis Date   Anemia    Arthritis    Blood dyscrasia    sickle cell trait   Carbuncle and furuncle    Hypercholesterolemia    Hypertension    does not take meds   Sickle cell trait ([place])     SURGICAL HISTORY: Past Surgical History:  Procedure Laterality Date   APPENDECTOMY     APPLICATION OF CRANIAL NAVIGATION N/A 07/28/2018   Procedure: APPLICATION OF CRANIAL NAVIGATION;  Surgeon: Kary Kos, MD;  Location: [facility];  Service: Neurosurgery;  Laterality: N/A;   CARPAL TUNNEL RELEASE  03/23/2011   Procedure: CARPAL TUNNEL RELEASE;  Surgeon: Sanjuana Kava;  Location: [facility];  Service: Orthopedics;  Laterality: Left;   CARPAL TUNNEL RELEASE  05/10/2011   Procedure: CARPAL TUNNEL RELEASE;  Surgeon: Sanjuana Kava, MD;  Location: [facility];  Service: Orthopedics;  Laterality: Right;   CESAREAN SECTION     x 2   INCISION AND DRAINAGE PERIRECTAL ABSCESS  12/21/09   PR DURAL GRAFT REPAIR,SPINE DEFECT N/A 07/28/2018   Procedure: Stereotactic open biopsy of Right cerebellar hemisphere and dura with brainlab;  Surgeon: Kary Kos, MD;  Location: [facility];  Service: Neurosurgery;  Laterality: N/A;  Stereotactic open biopsy of Right cerebellar hemisphere and dura with brainlab   PROCTOSCOPY  10/17/2010   Procedure: PROCTOSCOPY;  Surgeon: Scherry Ran;  Location: [facility];  Service: General;   Laterality: N/A;  Rigid Proctoscopy/Possible Fistula in Ano  Procedure ended at [place]     x2    SOCIAL HISTORY: Social History   Socioeconomic History   Marital status: Legally Separated    Spouse name: Not on file   Number of children: Not on file   Years of education: Not on file   Highest education level: Not on file  Occupational History   Not on file  Social Needs   Financial resource strain: Not on file   Food insecurity    Worry: Not on file    Inability: Not on file   Transportation needs    Medical: Not on file    Non-medical: Not on file  Tobacco Use   Smoking status: Current Every Day Smoker    Packs/day: 0.50    Years: 30.00    Pack years: 15.00    Types: Cigarettes   Smokeless tobacco: Never Used  Substance and Sexual Activity   Alcohol use: No   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   Sexual activity: Yes    Partners: Male    Birth  control/protection: None  Lifestyle   Physical activity    Days per week: Not on file    Minutes per session: Not on file   Stress: Not on file  Relationships   Social connections    Talks on phone: Not on file    Gets together: Not on file    Attends religious service: Not on file    Active member of club or organization: Not on file    Attends meetings of clubs or organizations: Not on file    Relationship status: Not on file   Intimate partner violence    Fear of current or ex partner: Not on file    Emotionally abused: Not on file    Physically abused: Not on file    Forced sexual activity: Not on file  Other Topics Concern   Not on file  Social History Narrative   Not on file    FAMILY HISTORY: Family History  Problem Relation Age of Onset   Kidney failure Daughter    Sickle cell anemia Daughter    Sickle cell trait Daughter    Sickle cell anemia Son    Hypertension Mother    Hyperlipidemia Mother    Hypertension Father    Lupus Father         skin   Hypertension Other    Sarcoidosis Sister    Anesthesia problems Neg Hx    Hypotension Neg Hx    Malignant hyperthermia Neg Hx    Pseudochol deficiency Neg Hx     ALLERGIES:  is allergic to penicillins; penicillins cross reactors; and tape.  MEDICATIONS:  Current Outpatient Medications  Medication Sig Dispense Refill   albuterol (PROAIR HFA) 108 (90 Base) MCG/ACT inhaler Inhale 2 puffs into the lungs 4 (four) times daily as needed.     atorvastatin (LIPITOR) 20 MG tablet Take 1 tablet (20 mg total) by mouth daily. 90 tablet 2   cyclobenzaprine (FLEXERIL) 10 MG tablet Take 1 tablet (10 mg total) by mouth 3 (three) times daily as needed for muscle spasms. (Patient not taking: Reported on 08/11/2018) 40 tablet 0   dexamethasone (DECADRON) 1 MG tablet 1mg  po daily with breakfast till 8/24 then 1mg  po every other day for 10 days then stop 30 tablet 0   ferrous sulfate 325 (65 FE) MG tablet Take 325 mg by mouth daily with breakfast.     HYDROcodone-acetaminophen (NORCO) 7.5-325 MG tablet Take 1 tablet by mouth every 8 (eight) hours as needed.     lisinopril-hydrochlorothiazide (PRINZIDE,ZESTORETIC) 20-12.5 MG tablet Take 1 tablet by mouth daily.     Multiple Vitamin (MULTIVITAMIN WITH MINERALS) TABS tablet Take 1 tablet by mouth daily.     potassium chloride SA (K-DUR) 20 MEQ tablet 40 meq po twice daily for 5 days then 5meq po daily for 2 weeks and rpt labs with PCP 60 tablet 0   ranitidine (ZANTAC) 150 MG capsule Take 150 mg by mouth 2 (two) times daily.     No current facility-administered medications for this visit.     REVIEW OF SYSTEMS:    A 10+ POINT REVIEW OF SYSTEMS WAS OBTAINED including neurology, dermatology, psychiatry, cardiac, respiratory, lymph, extremities, GI, GU, Musculoskeletal, constitutional, breasts, reproductive, HEENT.  All pertinent positives are noted in the HPI.  All others are negative.   PHYSICAL EXAMINATION: ECOG PERFORMANCE STATUS:  1 - Symptomatic but completely ambulatory  . Vitals:   11/25/18 0853  BP: 138/87  Pulse: 64  Resp: 18  Temp: 98.2  F (36.8 C)  SpO2: 100%   Filed Weights   11/25/18 0853  Weight: 177 lb (80.3 kg)   .Body mass index is 27.72 kg/m.  Exam was given in a chair.  GENERAL:alert, in no acute distress and comfortable SKIN: no acute rashes, no significant lesions EYES: conjunctiva are pink and non-injected, sclera anicteric OROPHARYNX: MMM, no exudates, no oropharyngeal erythema or ulceration NECK: supple, no JVD LYMPH:  no palpable lymphadenopathy in the cervical, axillary or inguinal regions LUNGS: clear to auscultation b/l with normal respiratory effort HEART: regular rate & rhythm ABDOMEN:  normoactive bowel sounds , non tender, not distended. Extremity: no pedal edema PSYCH: alert & oriented x 3 with fluent speech NEURO: no focal motor/sensory deficits  LABORATORY DATA:  I have reviewed the data as listed  . CBC Latest Ref Rng & Units 11/25/2018 11/17/2018 11/10/2018  WBC 4.0 - 10.5 K/uL 7.8 10.0 7.9  Hemoglobin 12.0 - 15.0 g/dL 12.6 13.0 12.9  Hematocrit 36.0 - 46.0 % 37.5 39.3 38.4  Platelets 150 - 400 K/uL 238 370 373    . CMP Latest Ref Rng & Units 11/25/2018 11/17/2018 11/10/2018  Glucose 70 - 99 mg/dL 92 92 112(H)  BUN 6 - 20 mg/dL 12 15 16   Creatinine 0.44 - 1.00 mg/dL 0.86 0.84 0.90  Sodium 135 - 145 mmol/L 140 140 140  Potassium 3.5 - 5.1 mmol/L 3.5 3.2(L) 4.0  Chloride 98 - 111 mmol/L 106 106 103  CO2 22 - 32 mmol/L 22 24 22   Calcium 8.9 - 10.3 mg/dL 9.4 9.6 10.2  Total Protein 6.5 - 8.1 g/dL 6.8 7.4 7.5  Total Bilirubin 0.3 - 1.2 mg/dL 0.3 0.3 0.3  Alkaline Phos 38 - 126 U/L 48 47 50  AST 15 - 41 U/L 14(L) 17 16  ALT 0 - 44 U/L 19 19 16     09/12/18 BM Biopsy:    07/28/18 Right cerebellar hemisphere and dura biopsy:     RADIOGRAPHIC STUDIES: I have personally reviewed the radiological images as listed and agreed with the findings in the  report. No results found.  ASSESSMENT & PLAN:  58 y.o. female with  1. 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 The process appears to extend into the upper cervical canal. Mild obstructive hydrocephalus. Differential includes tumor including metastatic disease and lymphoma. Chronic inflammatory process such as sarcoid is a consideration however chest x-ray negative. TB and atypical infection/fungus also possible. 6 mm colloid cyst felt to be a separate problem."  07/25/18 CT C/A/P which did not reveal significant abnormality  09/12/18 BM Bx revealed no evidence of lymphoma  09/08/18 PET/CT revealed "No hypermetabolic mass or adenopathy identified within the chest abdomen or pelvis to suggest metabolically active tumor. 2. Nonspecific focus of increased uptake within the cord extending from T12 to L1. Cannot rule out additional site of CNS lymphoma. Consider further evaluation with contrast enhanced MRI through this area. 3. Aortic Atherosclerosis and Emphysema."  10/16/2018 MRI brain w and w/o contrast revealed "1. Resolved dural mass in the right posterior fossa with minimal smooth dural thickening that may be treatment related. Cerebellar edema and mass effect  is also resolved. No new site of disease. 2. 6 mm colloid cyst."  10/20/2018 MRI cervical spine w and w/o contrast revealed "Negative for lymphoma. No acute abnormality. Central disc protrusion at C5-6 effaces the ventral thecal sac causing mild central canal narrowing. There is also a shallow  disc bulge at C6-7 which narrows but does not efface the ventral thecal Sac."  PLAN  -Discussed pt labwork today, 11/25/18; all values are WNL except for Albumin at 3.4, AST at 14.  -No prohibitive toxicities from fourth dose of Rituxan -Today is pt's last planned dose of her current immunotherapy, will move to maintenance Rituxan after today. -Maintenance will be one dose of Rituxan every two months, up to two years  -Recommended to take Dexamethasone every other day x 1 week, then every third day until she is finished with her Rx  -Will see back in 2 months, with new MRI  FOLLOW UP:  MRI brain in 7 weeks Please schedule 1st dose of maintenance Rituxan in 8 weeks with labs and MD visit   The total time spent in the appt was 25 minutes and more than 50% was on counseling and direct patient cares.  All of the patient's questions were answered with apparent satisfaction. The patient knows to call the clinic with any problems, questions or concerns.   Sullivan Lone MD [place] AAHIVMS [facility] Hematology/Oncology Physician [facility]  (Office):       [phone] (Work cell):  [phone] (Fax):           [phone]  11/25/2018 9:36 AM  I, Yevette Edwards, am acting as a scribe for Dr. Sullivan Lone.   .I have reviewed the above documentation for accuracy and completeness, and I agree with the above. Brunetta Genera MD

## 2018-11-25 ENCOUNTER — Inpatient Hospital Stay (HOSPITAL_BASED_OUTPATIENT_CLINIC_OR_DEPARTMENT_OTHER): Payer: BC Managed Care – PPO | Admitting: Hematology

## 2018-11-25 ENCOUNTER — Inpatient Hospital Stay: Payer: BC Managed Care – PPO

## 2018-11-25 ENCOUNTER — Other Ambulatory Visit: Payer: Self-pay

## 2018-11-25 ENCOUNTER — Telehealth: Payer: Self-pay | Admitting: Hematology

## 2018-11-25 VITALS — BP 138/87 | HR 64 | Temp 98.2°F | Resp 18 | Ht 67.0 in | Wt 177.0 lb

## 2018-11-25 VITALS — BP 118/64 | HR 68 | Temp 97.7°F | Resp 18

## 2018-11-25 DIAGNOSIS — Z5112 Encounter for antineoplastic immunotherapy: Secondary | ICD-10-CM | POA: Diagnosis not present

## 2018-11-25 DIAGNOSIS — C884 Extranodal marginal zone B-cell lymphoma of mucosa-associated lymphoid tissue [MALT-lymphoma]: Secondary | ICD-10-CM

## 2018-11-25 DIAGNOSIS — M1990 Unspecified osteoarthritis, unspecified site: Secondary | ICD-10-CM | POA: Diagnosis not present

## 2018-11-25 DIAGNOSIS — E785 Hyperlipidemia, unspecified: Secondary | ICD-10-CM | POA: Diagnosis not present

## 2018-11-25 DIAGNOSIS — F17210 Nicotine dependence, cigarettes, uncomplicated: Secondary | ICD-10-CM | POA: Diagnosis not present

## 2018-11-25 DIAGNOSIS — D573 Sickle-cell trait: Secondary | ICD-10-CM | POA: Diagnosis not present

## 2018-11-25 DIAGNOSIS — Z79899 Other long term (current) drug therapy: Secondary | ICD-10-CM | POA: Diagnosis not present

## 2018-11-25 DIAGNOSIS — I10 Essential (primary) hypertension: Secondary | ICD-10-CM | POA: Diagnosis not present

## 2018-11-25 DIAGNOSIS — Z7189 Other specified counseling: Secondary | ICD-10-CM

## 2018-11-25 LAB — CBC WITH DIFFERENTIAL/PLATELET
Abs Immature Granulocytes: 0.02 10*3/uL (ref 0.00–0.07)
Basophils Absolute: 0 10*3/uL (ref 0.0–0.1)
Basophils Relative: 0 %
Eosinophils Absolute: 0 10*3/uL (ref 0.0–0.5)
Eosinophils Relative: 1 %
HCT: 37.5 % (ref 36.0–46.0)
Hemoglobin: 12.6 g/dL (ref 12.0–15.0)
Immature Granulocytes: 0 %
Lymphocytes Relative: 26 %
Lymphs Abs: 2.1 10*3/uL (ref 0.7–4.0)
MCH: 30 pg (ref 26.0–34.0)
MCHC: 33.6 g/dL (ref 30.0–36.0)
MCV: 89.3 fL (ref 80.0–100.0)
Monocytes Absolute: 0.7 10*3/uL (ref 0.1–1.0)
Monocytes Relative: 9 %
Neutro Abs: 5 10*3/uL (ref 1.7–7.7)
Neutrophils Relative %: 64 %
Platelets: 238 10*3/uL (ref 150–400)
RBC: 4.2 MIL/uL (ref 3.87–5.11)
RDW: 13 % (ref 11.5–15.5)
WBC: 7.8 10*3/uL (ref 4.0–10.5)
nRBC: 0 % (ref 0.0–0.2)

## 2018-11-25 LAB — CMP (CANCER CENTER ONLY)
ALT: 19 U/L (ref 0–44)
AST: 14 U/L — ABNORMAL LOW (ref 15–41)
Albumin: 3.4 g/dL — ABNORMAL LOW (ref 3.5–5.0)
Alkaline Phosphatase: 48 U/L (ref 38–126)
Anion gap: 12 (ref 5–15)
BUN: 12 mg/dL (ref 6–20)
CO2: 22 mmol/L (ref 22–32)
Calcium: 9.4 mg/dL (ref 8.9–10.3)
Chloride: 106 mmol/L (ref 98–111)
Creatinine: 0.86 mg/dL (ref 0.44–1.00)
GFR, Est AFR Am: >60 mL/min (ref >60)
GFR, Estimated: >60 mL/min (ref >60)
Glucose, Bld: 92 mg/dL (ref 70–99)
Potassium: 3.5 mmol/L (ref 3.5–5.1)
Sodium: 140 mmol/L (ref 135–145)
Total Bilirubin: 0.3 mg/dL (ref 0.3–1.2)
Total Protein: 6.8 g/dL (ref 6.5–8.1)

## 2018-11-25 MED ORDER — DEXAMETHASONE SODIUM PHOSPHATE 10 MG/ML IJ SOLN
10.0000 mg | Freq: Once | INTRAMUSCULAR | Status: AC
  Administered 2018-11-25: 10:00:00 10 mg via INTRAVENOUS

## 2018-11-25 MED ORDER — SODIUM CHLORIDE 0.9 % IV SOLN
Freq: Once | INTRAVENOUS | Status: AC
  Administered 2018-11-25: 10:00:00 via INTRAVENOUS
  Filled 2018-11-25: qty 250

## 2018-11-25 MED ORDER — DEXAMETHASONE SODIUM PHOSPHATE 10 MG/ML IJ SOLN
INTRAMUSCULAR | Status: AC
  Filled 2018-11-25: qty 1

## 2018-11-25 MED ORDER — FAMOTIDINE IN NACL 20-0.9 MG/50ML-% IV SOLN
INTRAVENOUS | Status: AC
  Filled 2018-11-25: qty 50

## 2018-11-25 MED ORDER — SODIUM CHLORIDE 0.9 % IV SOLN
Freq: Once | INTRAVENOUS | Status: AC
  Administered 2018-11-25: 12:00:00 via INTRAVENOUS
  Filled 2018-11-25: qty 180

## 2018-11-25 MED ORDER — DIPHENHYDRAMINE HCL 25 MG PO CAPS
ORAL_CAPSULE | ORAL | Status: AC
  Filled 2018-11-25: qty 2

## 2018-11-25 MED ORDER — ACETAMINOPHEN 325 MG PO TABS
ORAL_TABLET | ORAL | Status: AC
  Filled 2018-11-25: qty 2

## 2018-11-25 MED ORDER — FAMOTIDINE IN NACL 20-0.9 MG/50ML-% IV SOLN
20.0000 mg | Freq: Once | INTRAVENOUS | Status: AC
  Administered 2018-11-25: 20 mg via INTRAVENOUS

## 2018-11-25 MED ORDER — DIPHENHYDRAMINE HCL 25 MG PO CAPS
50.0000 mg | ORAL_CAPSULE | Freq: Once | ORAL | Status: AC
  Administered 2018-11-25: 50 mg via ORAL

## 2018-11-25 MED ORDER — ACETAMINOPHEN 325 MG PO TABS
650.0000 mg | ORAL_TABLET | Freq: Once | ORAL | Status: AC
  Administered 2018-11-25: 10:00:00 650 mg via ORAL

## 2018-11-25 NOTE — Telephone Encounter (Signed)
Scheduled appt per 8/25 los. ° °Printed calendar and avs. °

## 2018-11-25 NOTE — Patient Instructions (Signed)
Rituximab injection What is this medicine? RITUXIMAB (ri TUX i mab) is a monoclonal antibody. It is used to treat certain types of cancer like non-Hodgkin lymphoma and chronic lymphocytic leukemia. It is also used to treat rheumatoid arthritis, granulomatosis with polyangiitis (or Wegener's granulomatosis), microscopic polyangiitis, and pemphigus vulgaris. This medicine may be used for other purposes; ask your health care provider or pharmacist if you have questions. COMMON BRAND NAME(S): Rituxan, RUXIENCE What should I tell my health care provider before I take this medicine? They need to know if you have any of these conditions:  heart disease  infection (especially a virus infection such as hepatitis B, chickenpox, cold sores, or herpes)  immune system problems  irregular heartbeat  kidney disease  low blood counts, like low white cell, platelet, or red cell counts  lung or breathing disease, like asthma  recently received or scheduled to receive a vaccine  an unusual or allergic reaction to rituximab, other medicines, foods, dyes, or preservatives  pregnant or trying to get pregnant  breast-feeding How should I use this medicine? This medicine is for infusion into a vein. It is administered in a hospital or clinic by a specially trained health care professional. A special MedGuide will be given to you by the pharmacist with each prescription and refill. Be sure to read this information carefully each time. Talk to your pediatrician regarding the use of this medicine in children. This medicine is not approved for use in children. Overdosage: If you think you have taken too much of this medicine contact a poison control center or emergency room at once. NOTE: This medicine is only for you. Do not share this medicine with others. What if I miss a dose? It is important not to miss a dose. Call your doctor or health care professional if you are unable to keep an appointment. What  may interact with this medicine?  cisplatin  live virus vaccines This list may not describe all possible interactions. Give your health care provider a list of all the medicines, herbs, non-prescription drugs, or dietary supplements you use. Also tell them if you smoke, drink alcohol, or use illegal drugs. Some items may interact with your medicine. What should I watch for while using this medicine? Your condition will be monitored carefully while you are receiving this medicine. You may need blood work done while you are taking this medicine. This medicine can cause serious allergic reactions. To reduce your risk you may need to take medicine before treatment with this medicine. Take your medicine as directed. In some patients, this medicine may cause a serious brain infection that may cause death. If you have any problems seeing, thinking, speaking, walking, or standing, tell your healthcare professional right away. If you cannot reach your healthcare professional, urgently seek other source of medical care. Call your doctor or health care professional for advice if you get a fever, chills or sore throat, or other symptoms of a cold or flu. Do not treat yourself. This drug decreases your body's ability to fight infections. Try to avoid being around people who are sick. Do not become pregnant while taking this medicine or for at least 12 months after stopping it. Women should inform their doctor if they wish to become pregnant or think they might be pregnant. There is a potential for serious side effects to an unborn child. Talk to your health care professional or pharmacist for more information. Do not breast-feed an infant while taking this medicine or for at   least 6 months after stopping it. What side effects may I notice from receiving this medicine? Side effects that you should report to your doctor or health care professional as soon as possible:  allergic reactions like skin rash, itching or  hives; swelling of the face, lips, or tongue  breathing problems  chest pain  changes in vision  diarrhea  headache with fever, neck stiffness, sensitivity to light, nausea, or confusion  fast, irregular heartbeat  loss of memory  low blood counts - this medicine may decrease the number of white blood cells, red blood cells and platelets. You may be at increased risk for infections and bleeding.  mouth sores  problems with balance, talking, or walking  redness, blistering, peeling or loosening of the skin, including inside the mouth  signs of infection - fever or chills, cough, sore throat, pain or difficulty passing urine  signs and symptoms of kidney injury like trouble passing urine or change in the amount of urine  signs and symptoms of liver injury like dark yellow or brown urine; general ill feeling or flu-like symptoms; light-colored stools; loss of appetite; nausea; right upper belly pain; unusually weak or tired; yellowing of the eyes or skin  signs and symptoms of low blood pressure like dizziness; feeling faint or lightheaded, falls; unusually weak or tired  stomach pain  swelling of the ankles, feet, hands  unusual bleeding or bruising  vomiting Side effects that usually do not require medical attention (report to your doctor or health care professional if they continue or are bothersome):  headache  joint pain  muscle cramps or muscle pain  nausea  tiredness This list may not describe all possible side effects. Call your doctor for medical advice about side effects. You may report side effects to FDA at 1-800-FDA-1088. Where should I keep my medicine? This drug is given in a hospital or clinic and will not be stored at home. NOTE: This sheet is a summary. It may not cover all possible information. If you have questions about this medicine, talk to your doctor, pharmacist, or health care provider.  2020 Elsevier/Gold Standard (2018-04-30  22:01:36)   [facility] Discharge Instructions for Patients Receiving Chemotherapy  Today you received the following chemotherapy agents: Ruxience.  To help prevent nausea and vomiting after your treatment, we encourage you to take your nausea medication as directed.   If you develop nausea and vomiting that is not controlled by your nausea medication, call the clinic.   BELOW ARE SYMPTOMS THAT SHOULD BE REPORTED IMMEDIATELY:  *FEVER GREATER THAN 100.5 F  *CHILLS WITH OR WITHOUT FEVER  NAUSEA AND VOMITING THAT IS NOT CONTROLLED WITH YOUR NAUSEA MEDICATION  *UNUSUAL SHORTNESS OF BREATH  *UNUSUAL BRUISING OR BLEEDING  TENDERNESS IN MOUTH AND THROAT WITH OR WITHOUT PRESENCE OF ULCERS  *URINARY PROBLEMS  *BOWEL PROBLEMS  UNUSUAL RASH Items with * indicate a potential emergency and should be followed up as soon as possible.  Feel free to call the clinic should you have any questions or concerns. The clinic phone number is [phone].  Please show the CHEMO ALERT CARD at check-in to the Emergency Department and triage nurse.   

## 2018-12-05 ENCOUNTER — Telehealth: Payer: Self-pay | Admitting: *Deleted

## 2018-12-05 NOTE — Telephone Encounter (Signed)
Patient called to verify upcoming appts for MRI, follow up appt with Dr. Irene Limbo and next infusion. Dates and times given. She states she wants to talk with doctor about when she can resume eating crab legs.

## 2018-12-30 DIAGNOSIS — C8309 Small cell B-cell lymphoma, extranodal and solid organ sites: Secondary | ICD-10-CM | POA: Diagnosis not present

## 2018-12-30 DIAGNOSIS — I10 Essential (primary) hypertension: Secondary | ICD-10-CM | POA: Diagnosis not present

## 2018-12-30 DIAGNOSIS — F17210 Nicotine dependence, cigarettes, uncomplicated: Secondary | ICD-10-CM | POA: Diagnosis not present

## 2018-12-30 DIAGNOSIS — Z111 Encounter for screening for respiratory tuberculosis: Secondary | ICD-10-CM | POA: Diagnosis not present

## 2018-12-30 DIAGNOSIS — Z0001 Encounter for general adult medical examination with abnormal findings: Secondary | ICD-10-CM | POA: Diagnosis not present

## 2018-12-30 DIAGNOSIS — L0292 Furuncle, unspecified: Secondary | ICD-10-CM | POA: Diagnosis not present

## 2018-12-31 DIAGNOSIS — Z23 Encounter for immunization: Secondary | ICD-10-CM | POA: Diagnosis not present

## 2019-01-03 ENCOUNTER — Other Ambulatory Visit: Payer: Self-pay

## 2019-01-03 ENCOUNTER — Emergency Department (HOSPITAL_COMMUNITY)
Admission: EM | Admit: 2019-01-03 | Discharge: 2019-01-03 | Disposition: A | Discharge status: Home / Self Care | Payer: BC Managed Care – PPO | Attending: Emergency Medicine | Admitting: Emergency Medicine

## 2019-01-03 DIAGNOSIS — I10 Essential (primary) hypertension: Secondary | ICD-10-CM | POA: Diagnosis not present

## 2019-01-03 DIAGNOSIS — Z79899 Other long term (current) drug therapy: Secondary | ICD-10-CM | POA: Insufficient documentation

## 2019-01-03 DIAGNOSIS — N751 Abscess of Bartholin's gland: Secondary | ICD-10-CM | POA: Diagnosis not present

## 2019-01-03 DIAGNOSIS — F17210 Nicotine dependence, cigarettes, uncomplicated: Secondary | ICD-10-CM | POA: Insufficient documentation

## 2019-01-03 DIAGNOSIS — R102 Pelvic and perineal pain: Secondary | ICD-10-CM | POA: Diagnosis not present

## 2019-01-03 MED ORDER — HYDROCODONE-ACETAMINOPHEN 5-325 MG PO TABS: 1.0000 | ORAL_TABLET | ORAL | 0 refills | Status: DC | PRN

## 2019-01-03 NOTE — Discharge Instructions (Addendum)
Finish your antibiotic as prescribed by your MD.  The site is draining adequately, however, I recommend continued warm soaks for 10 minutes 3 times daily along with gentle massage as discussed to encourage the site to keep draining.  You may take the hydrocodone prescribed for pain relief.  This will make you drowsy - do not drive within 4 hours of taking this medication.

## 2019-01-03 NOTE — ED Triage Notes (Signed)
Pt has a vaginal boil to right outer lip. Came up 1 week ago. Denies fevers and chills.

## 2019-01-04 NOTE — ED Provider Notes (Signed)
[facility] EMERGENCY DEPARTMENT Provider Note   CSN: [id number] Arrival date & time: 01/03/19  1411     History   Chief Complaint Chief Complaint  Patient presents with  . Abscess    HPI Amber Drake is a 58 y.o. female with a history of anemia, arthritis, HTN and sickle cell trait presenting with an abscess of her right labia majora.  She reports prior abscess at this site and is currently taking doxycycline prescribed by her pcp.  She has been employing warm epsom salt soaks and the site has started to drain.  She has complaint of significant pain causing difficulty walking and sitting.  She denies fevers, chills or other complaints.     The history is provided by the patient.    Past Medical History:  Diagnosis Date  . Anemia   . Arthritis   . Blood dyscrasia    sickle cell trait  . Carbuncle and furuncle   . Hypercholesterolemia   . Hypertension    does not take meds  . Sickle cell trait [facility])     Patient Active Problem List   Diagnosis Date Noted  . 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 Counseling regarding advance care planning and goals of care 10/23/2018  . 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 Brain tumor ([place]) 07/28/2018  . Hidradenitis 07/31/2016  . Recurrent boils 07/31/2016  . Hyperlipidemia 06/15/2015  . Pain of left hand 05/19/2015  . Hand pain, right 05/19/2015  . Hematuria 03/03/2015  . Esophageal reflux 03/03/2015  . Essential hypertension, benign 03/03/2015  . Bronchitis due to tobacco use 10/17/2010    Past Surgical History:  Procedure Laterality Date  . APPENDECTOMY    . APPLICATION OF CRANIAL NAVIGATION N/A 07/28/2018   Procedure: APPLICATION OF CRANIAL NAVIGATION;  Surgeon: Kary Kos, MD;  Location: [facility];  Service: Neurosurgery;  Laterality: N/A;  . CARPAL TUNNEL RELEASE  03/23/2011   Procedure: CARPAL TUNNEL RELEASE;  Surgeon: Sanjuana Kava;  Location: [facility];  Service: Orthopedics;  Laterality:  Left;  . CARPAL TUNNEL RELEASE  05/10/2011   Procedure: CARPAL TUNNEL RELEASE;  Surgeon: Sanjuana Kava, MD;  Location: [facility];  Service: Orthopedics;  Laterality: Right;  . CESAREAN SECTION     x 2  . INCISION AND DRAINAGE PERIRECTAL ABSCESS  12/21/09  . PR DURAL GRAFT REPAIR,SPINE DEFECT N/A 07/28/2018   Procedure: Stereotactic open biopsy of Right cerebellar hemisphere and dura with brainlab;  Surgeon: Kary Kos, MD;  Location: [facility];  Service: Neurosurgery;  Laterality: N/A;  Stereotactic open biopsy of Right cerebellar hemisphere and dura with brainlab  . PROCTOSCOPY  10/17/2010   Procedure: PROCTOSCOPY;  Surgeon: Scherry Ran;  Location: [facility];  Service: General;  Laterality: N/A;  Rigid Proctoscopy/Possible Fistula in Ano  Procedure ended at 1003  . THERAPEUTIC ABORTION     x2     OB History    Gravida  4   Para  2   Term  2   Preterm      AB  2   Living  2     SAB      TAB      Ectopic      Multiple      Live Births               Home Medications    Prior to Admission medications   Medication Sig Start Date End Date Taking? Authorizing Provider  albuterol (PROAIR  HFA) 108 (90 Base) MCG/ACT inhaler Inhale 2 puffs into the lungs 4 (four) times daily as needed. 02/25/17   [provider]  atorvastatin (LIPITOR) 20 MG tablet Take 1 tablet (20 mg total) by mouth daily. 03/14/15   Soyla Dryer, PA-C  cyclobenzaprine (FLEXERIL) 10 MG tablet Take 1 tablet (10 mg total) by mouth 3 (three) times daily as needed for muscle spasms. Patient not taking: Reported on 08/11/2018 11/07/16   Sanjuana Kava, MD  dexamethasone (DECADRON) 1 MG tablet 1mg  po daily with breakfast till 8/24 then 1mg  po every other day for 10 days then stop 11/17/18   Brunetta Genera, MD  ferrous sulfate 325 (65 FE) MG tablet Take 325 mg by mouth daily with breakfast.    [provider]  HYDROcodone-acetaminophen (NORCO/VICODIN) 5-325 MG tablet Take 1 tablet by mouth  every 4 (four) hours as needed. 01/03/19   Evalee Jefferson, PA-C  lisinopril-hydrochlorothiazide (PRINZIDE,ZESTORETIC) 20-12.5 MG tablet Take 1 tablet by mouth daily.    [provider]  Multiple Vitamin (MULTIVITAMIN WITH MINERALS) TABS tablet Take 1 tablet by mouth daily.    [provider]  potassium chloride SA (K-DUR) 20 MEQ tablet 40 meq po twice daily for 5 days then 66meq po daily for 2 weeks and rpt labs with PCP 10/07/18   Brunetta Genera, MD  ranitidine (ZANTAC) 150 MG capsule Take 150 mg by mouth 2 (two) times daily.    [provider]    Family History Family History  Problem Relation Age of Onset  . Kidney failure Daughter   . Sickle cell anemia Daughter   . Sickle cell trait Daughter   . Sickle cell anemia Son   . Hypertension Mother   . Hyperlipidemia Mother   . Hypertension Father   . Lupus Father        skin  . Hypertension Other   . Sarcoidosis Sister   . Anesthesia problems Neg Hx   . Hypotension Neg Hx   . Malignant hyperthermia Neg Hx   . Pseudochol deficiency Neg Hx     Social History Social History   Tobacco Use  . Smoking status: Current Every Day Smoker    Packs/day: 0.50    Years: 30.00    Pack years: 15.00    Types: Cigarettes  . Smokeless tobacco: Never Used  Substance Use Topics  . Alcohol use: No  . 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  HENT: Negative.  Negative for congestion.   Eyes: Negative.   Respiratory: Negative for chest tightness and shortness of breath.   Cardiovascular: Negative for chest pain.  Gastrointestinal: Negative for abdominal pain, nausea and vomiting.  Genitourinary: Negative.        Negative except as mentioned in HPI.   Musculoskeletal: Negative for arthralgias and joint swelling.  Skin: Negative.  Negative for rash and wound.   Neurological: Negative for dizziness, weakness, light-headedness, numbness and headaches.  Psychiatric/Behavioral: Negative.   All other systems reviewed and are negative.    Physical Exam Updated Vital Signs BP 118/80 (BP Location: Right Arm)   Pulse 88   Temp 98.7 F (37.1 C) (Oral)   Resp 15   Wt 80.3 kg   LMP 08/12/2010   SpO2 99%   BMI 27.73 kg/m   Physical Exam Vitals signs and  nursing note reviewed. Exam conducted with a chaperone present.  Constitutional:      Appearance: She is well-developed.  HENT:     Head: Normocephalic and atraumatic.  Eyes:     Conjunctiva/sclera: Conjunctivae normal.  Neck:     Musculoskeletal: Normal range of motion.  Cardiovascular:     Rate and Rhythm: Normal rate.  Pulmonary:     Effort: Pulmonary effort is normal.     Breath sounds: No wheezing.  Genitourinary:    Labia:        Right: Tenderness present.      Comments: Edema and tenderness along the middle to posterior right labia majora.  Old appearing tract at the lateral posterior edge with active drainage of purulence with gentle pressure.   Musculoskeletal: Normal range of motion.  Skin:    General: Skin is warm and dry.  Neurological:     Mental Status: She is alert.      ED Treatments / Results  Labs (all labs ordered are listed, but only abnormal results are displayed) Labs Reviewed - No data to display  EKG None  Radiology No results found.  Procedures Procedures (including critical care time)  Medications Ordered in ED Medications - No data to display   Initial Impression / Assessment and Plan / ED Course  I have reviewed the triage vital signs and the nursing notes.  Pertinent labs & imaging results that were available during my care of the patient were reviewed by me and considered in my medical decision making (see chart for details).        Pt with actively draining bartholins abscess. Further I&D not felt indicated. Advised to continue abx,  warm soaks with gentle pressure to continue drainage.  Pt prescribed pain medicine for better tolerance.  Advised recheck by pcp if sx are not resolving with this tx.    Final Clinical Impressions(s) / ED Diagnoses   Final diagnoses:  Abscess of Bartholin's gland    ED Discharge Orders         Ordered    HYDROcodone-acetaminophen (NORCO/VICODIN) 5-325 MG tablet  Every 4 hours PRN     01/03/19 1638           Evalee Jefferson, PA-C 01/04/19 [id number]    Daleen Bo, MD 01/04/19 1012

## 2019-01-13 ENCOUNTER — Ambulatory Visit (HOSPITAL_COMMUNITY)
Admission: RE | Admit: 2019-01-13 | Discharge: 2019-01-13 | Disposition: A | Discharge status: Home / Self Care | Payer: BC Managed Care – PPO | Source: Ambulatory Visit | Attending: Hematology | Admitting: Hematology

## 2019-01-13 ENCOUNTER — Other Ambulatory Visit: Payer: Self-pay

## 2019-01-13 DIAGNOSIS — C884 Extranodal marginal zone B-cell lymphoma of mucosa-associated lymphoid tissue [MALT-lymphoma]: Secondary | ICD-10-CM

## 2019-01-13 DIAGNOSIS — Z5112 Encounter for antineoplastic immunotherapy: Secondary | ICD-10-CM

## 2019-01-13 DIAGNOSIS — C8589 Other specified types of non-Hodgkin lymphoma, extranodal and solid organ sites: Secondary | ICD-10-CM | POA: Diagnosis not present

## 2019-01-13 LAB — POCT I-STAT CREATININE: Creatinine, Ser: 0.7 mg/dL (ref 0.44–1.00)

## 2019-01-13 MED ORDER — GADOBUTROL 1 MMOL/ML IV SOLN
7.0000 mL | Freq: Once | INTRAVENOUS | Status: AC | PRN
  Administered 2019-01-13: 09:00:00 7 mL via INTRAVENOUS

## 2019-01-14 NOTE — Progress Notes (Signed)
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 female who has been referred to [place] by my colleague Dr. Cecil Drake in Neuro-Oncology for evaluation and management of her Newly diagnosed Extranodal Marginal Zone Lymphoma. She is accompanied today by her sister Amber Drake via [employer]. The pt reports that she is doing well overall.  Prior to today's visit, the pt presented to the ED on 07/21/18 with complaints of a headache for the past month which presented intermittently and was worsened by bending, coughing, or laughing. She had a CT Head and MRI Brain which revealed extensive dural thickening, as noted below. She then had a right posterior fossa craniectomy on 07/28/18 for open exicisional biopsies of the right cerebellar hemisphere and dura which revealed Extranodal marginal zone lymphoma. The pt then established care with my colleague Dr. Cecil Drake in Neuro-Onc on 08/11/18. She has been taking 4mg  Decadron daily.  The pt reports that developed worsened headaches about a month ago, and notes that when she laughed it felt like "electiricty" was running through the sides and back of her head. She notes that some mild headaches had begun a couple months ago. Denies double vision, changes in vision, loss of balance, or other senses being affected. The pt notes that in addition to this, she developed redness in the eyes in the past couple months, saw eye doctor and was prescribed eye drops. Denies other concerns in the last 6 months including bone pains, fatigue, fevers, chills, night sweats or unexpected weight loss. She notes that her headaches have resolved. She notes she began Decadron after her biopsy. She denies having "any symptoms  whatsoever," currently.   Of note prior to the patient's visit today, pt has had an MRI Brain completed on 07/21/18 with results revealing "Extensive dural thickening surrounding the right cerebellar hemisphere with mass-effect and edema in the right cerebellum. The process appears to extend into the upper cervical canal. Mild obstructive hydrocephalus. Differential includes tumor including metastatic disease and lymphoma. Chronic inflammatory process such as sarcoid is a consideration however chest x-ray negative. TB and atypical infection/fungus also possible. 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  On review of systems, pt reports good energy levels, headache resolution, and denies joint issues, skin rashes, dry mouth, dry eyes, fevers, chills, night sweats, bone pains, unexpected weight loss, fatigue, double vision, loss of vision, affected senses, loss of balance, and any other symptoms.   On PMHx the pt reports sickle cel trait, HTN, hyperlipidemia. On Social Hx the pt reports that she smoked 1 ppd for the last 42 years, and reports having quit two days ago. 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 On Family Hx the pt reports dad with lupus and sister with sarcoidosis. Daughter and son with sickle cell disease. Denies other blood disorders or cancers. Endorses Penicillin allergy  Interval History:   Amber Drake returns today for management and evaluation of her CNS Extranodal Marginal Zone Lymphoma.  She is here for her first dose of maintenance Rituxan. The patient's last visit with [place] was on 11/25/2018. The pt reports that she is doing well overall.  The pt reports her daughter passed in September from a long-term  medical condition, she was fairly young. She is doing okay and has a positive outlook on the situation. She is comforted by the fact that her daughter is no longer in pain. Pt began taking Doxycyline 100 mg yesterday due to her continuously getting boils in her groin area and between the legs. It was prescribed after a check-up with her OBGYN. A few weeks ago she went to the ER and was placed on 50 mg of antibiotics but her boils were not draining quickly enough. She has been using warm compresses to help the masses drain. Pt is unsure if she is a carrier for MRSA.   Of note since the patient's last visit, pt has had MRI Brain ([id number]) completed on 01/13/2019 with results revealing "1. Stable and satisfactory post treatment appearance of the posterior fossa. 2. Stable small 6 mm colloid cyst. 3. No new intracranial abnormality."  Lab results today (01/21/19) of CBC w/diff and CMP is as follows: all values are WNL except for Hgb at 11.9, HCT at 35.7, Potassium at 3.1.  On review of systems, pt reports draining leg boils and denies headaches, back pains, fevers, chills, night sweats, abdominal pain and any other symptoms.    MEDICAL HISTORY:  Past Medical History:  Diagnosis Date   Anemia    Arthritis    Blood dyscrasia    sickle cell trait   Carbuncle and furuncle    Hypercholesterolemia    Hypertension    does not take meds   Sickle cell trait ([place])     SURGICAL HISTORY: Past Surgical History:  Procedure Laterality Date   APPENDECTOMY     APPLICATION OF CRANIAL NAVIGATION N/A 07/28/2018   Procedure: APPLICATION OF CRANIAL NAVIGATION;  Surgeon: Kary Kos, MD;  Location: [facility];  Service: Neurosurgery;  Laterality: N/A;   BRAIN SURGERY     CARPAL TUNNEL RELEASE  03/23/2011   Procedure: CARPAL TUNNEL RELEASE;  Surgeon: Sanjuana Kava;  [facility];  Service: Orthopedics;  Laterality: Left;   CARPAL TUNNEL RELEASE  05/10/2011   Procedure: CARPAL TUNNEL RELEASE;  Surgeon:  Sanjuana Kava, MD;  Location: [facility];  Service: Orthopedics;  Laterality: Right;   CESAREAN SECTION     x 2   INCISION AND DRAINAGE PERIRECTAL ABSCESS  12/21/09   PR DURAL GRAFT REPAIR,SPINE DEFECT N/A 07/28/2018   Procedure: Stereotactic open biopsy of Right cerebellar hemisphere and dura with brainlab;  Surgeon: Kary Kos, MD;  Location: [facility];  Service: Neurosurgery;  Laterality: N/A;  Stereotactic open biopsy of Right cerebellar hemisphere and dura with brainlab   PROCTOSCOPY  10/17/2010   Procedure: PROCTOSCOPY;  Surgeon: Scherry Ran;  Location: [facility];  Service: General;  Laterality: N/A;  Rigid Proctoscopy/Possible Fistula in Ano  Procedure ended at [place]     x2    SOCIAL HISTORY: Social History   Socioeconomic History   Marital status: Legally Separated    Spouse name: Not on file   Number of children: Not on file   Years of education: Not on file   Highest education level: Not on file  Occupational History   Not on file  Social Needs   Financial resource strain: Not on file   Food insecurity  Worry: Not on file    Inability: Not on file   Transportation needs    Medical: Not on file    Non-medical: Not on file  Tobacco Use   Smoking status: Current Every Day Smoker    Packs/day: 0.50    Years: 30.00    Pack years: 15.00    Types: Cigarettes   Smokeless tobacco: Never Used  Substance and Sexual Activity   Alcohol use: No   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   Sexual activity: Yes    Partners: Male    Birth control/protection: None  Lifestyle   Physical activity    Days per week: Not on file    Minutes per session: Not on file   Stress: Not on file  Relationships   Social connections    Talks on phone: Not on file    Gets together: Not on file    Attends religious service: Not on file    Active member of club or organization: Not on file    Attends meetings of clubs or organizations: Not on  file    Relationship status: Not on file   Intimate partner violence    Fear of current or ex partner: Not on file    Emotionally abused: Not on file    Physically abused: Not on file    Forced sexual activity: Not on file  Other Topics Concern   Not on file  Social History Narrative   Not on file    FAMILY HISTORY: Family History  Problem Relation Age of Onset   Kidney failure Daughter    Sickle cell anemia Daughter    Sickle cell trait Daughter    Sickle cell anemia Son    Hypertension Mother    Hyperlipidemia Mother    Hypertension Father    Lupus Father        skin   Hypertension Other    Sarcoidosis Sister    Anesthesia problems Neg Hx    Hypotension Neg Hx    Malignant hyperthermia Neg Hx    Pseudochol deficiency Neg Hx     ALLERGIES:  is allergic to penicillins; penicillins cross reactors; and tape.  MEDICATIONS:  Current Outpatient Medications  Medication Sig Dispense Refill   albuterol (PROAIR HFA) 108 (90 Base) MCG/ACT inhaler Inhale 2 puffs into the lungs 4 (four) times daily as needed.     atorvastatin (LIPITOR) 20 MG tablet Take 1 tablet (20 mg total) by mouth daily. 90 tablet 2   cyclobenzaprine (FLEXERIL) 10 MG tablet Take 1 tablet (10 mg total) by mouth 3 (three) times daily as needed for muscle spasms. 40 tablet 0   dexamethasone (DECADRON) 1 MG tablet 1mg  po daily with breakfast till 8/24 then 1mg  po every other day for 10 days then stop (Patient not taking: Reported on 01/20/2019) 30 tablet 0   doxycycline (VIBRAMYCIN) 100 MG capsule Take 1 capsule (100 mg total) by mouth 2 (two) times daily. 28 capsule 0   ferrous sulfate 325 (65 FE) MG tablet Take 325 mg by mouth daily with breakfast.     HYDROcodone-acetaminophen (NORCO/VICODIN) 5-325 MG tablet Take 1 tablet by mouth every 4 (four) hours as needed. (Patient not taking: Reported on 01/20/2019) 15 tablet 0   lisinopril-hydrochlorothiazide (PRINZIDE,ZESTORETIC) 20-12.5 MG tablet  Take 1 tablet by mouth daily.     Multiple Vitamin (MULTIVITAMIN WITH MINERALS) TABS tablet Take 1 tablet by mouth daily.     potassium chloride  SA (KLOR-CON) 20 MEQ tablet Take 1 tablet (20 mEq total) by mouth 2 (two) times daily. 60 tablet 1   ranitidine (ZANTAC) 150 MG capsule Take 150 mg by mouth 2 (two) times daily.     traMADol (ULTRAM) 50 MG tablet Take 1 tablet (50 mg total) by mouth every 6 (six) hours as needed for moderate pain or severe pain. 30 tablet 0   No current facility-administered medications for this visit.    Facility-Administered Medications Ordered in Other Visits  Medication Dose Route Frequency Provider Last Rate Last Dose   famotidine (PEPCID) IVPB 20 mg premix  20 mg Intravenous Once Aylani Spurlock, Cloria Spring, MD       riTUXimab-pvvr (RUXIENCE) 700 mg in sodium chloride 0.9 % 180 mL infusion  375 mg/m2 (Treatment Plan Recorded) Intravenous Once Brunetta Genera, MD        REVIEW OF SYSTEMS:    A 10+ POINT REVIEW OF SYSTEMS WAS OBTAINED including neurology, dermatology, psychiatry, cardiac, respiratory, lymph, extremities, GI, GU, Musculoskeletal, constitutional, breasts, reproductive, HEENT.  All pertinent positives are noted in the HPI.  All others are negative.   PHYSICAL EXAMINATION: ECOG PERFORMANCE STATUS: 1 - Symptomatic but completely ambulatory  . Vitals:   01/21/19 0933  BP: (!) 140/57  Pulse: 69  Resp: 18  Temp: 98 F (36.7 C)  SpO2: 100%   Filed Weights   01/21/19 0933  Weight: 175 lb 6.4 oz (79.6 kg)   .Body mass index is 27.07 kg/m.  GENERAL:alert, in no acute distress and comfortable SKIN: no acute rashes, no significant lesions EYES: conjunctiva are pink and non-injected, sclera anicteric OROPHARYNX: MMM, no exudates, no oropharyngeal erythema or ulceration NECK: supple, no JVD LYMPH:  no palpable lymphadenopathy in the cervical, axillary or inguinal regions LUNGS: clear to auscultation b/l with normal respiratory  effort HEART: regular rate & rhythm ABDOMEN:  normoactive bowel sounds , non tender, not distended. No palpable hepatosplenomegaly.  Extremity: no pedal edema PSYCH: alert & oriented x 3 with fluent speech NEURO: no focal motor/sensory deficits  LABORATORY DATA:  I have reviewed the data as listed  . CBC Latest Ref Rng & Units 01/21/2019 11/25/2018 11/17/2018  WBC 4.0 - 10.5 K/uL 6.8 7.8 10.0  Hemoglobin 12.0 - 15.0 g/dL 11.9(L) 12.6 13.0  Hematocrit 36.0 - 46.0 % 35.7(L) 37.5 39.3  Platelets 150 - 400 K/uL 319 238 370    . CMP Latest Ref Rng & Units 01/21/2019 01/13/2019 11/25/2018  Glucose 70 - 99 mg/dL 96 - 92  BUN 6 - 20 mg/dL 10 - 12  Creatinine 0.44 - 1.00 mg/dL 0.81 0.70 0.86  Sodium 135 - 145 mmol/L 142 - 140  Potassium 3.5 - 5.1 mmol/L 3.1(L) - 3.5  Chloride 98 - 111 mmol/L 102 - 106  CO2 22 - 32 mmol/L 26 - 22  Calcium 8.9 - 10.3 mg/dL 10.3 - 9.4  Total Protein 6.5 - 8.1 g/dL 7.2 - 6.8  Total Bilirubin 0.3 - 1.2 mg/dL 0.3 - 0.3  Alkaline Phos 38 - 126 U/L 57 - 48  AST 15 - 41 U/L 15 - 14(L)  ALT 0 - 44 U/L 10 - 19    09/12/18 BM Biopsy:    07/28/18 Right cerebellar hemisphere and dura biopsy:     RADIOGRAPHIC STUDIES: I have personally reviewed the radiological images as listed and agreed with the findings in the report. Mr Jeri Cos Wo Contrast  Result Date: 01/13/2019 CLINICAL DATA:  58 year old female with CNS lymphoma.  Weakness.  EXAM: MRI HEAD WITHOUT AND WITH CONTRAST TECHNIQUE: Multiplanar, multiecho pulse sequences of the brain and surrounding structures were obtained without and with intravenous contrast. CONTRAST:  24mL GADAVIST GADOBUTROL 1 MMOL/ML IV SOLN COMPARISON:  10/16/2018 MRI and earlier. FINDINGS: Brain: Unchanged small 6 millimeter colloid cyst with intrinsic T1 hyperintensity redemonstrated. No ventriculomegaly. Right suboccipital craniectomy changes again noted. No significant signal abnormality in the underlying cerebellum now. Posterior  fossa dural thickening remains resolved. No restricted diffusion to suggest acute infarction. No midline shift, mass effect, or acute intracranial hemorrhage. Cervicomedullary junction and pituitary are within normal limits. No chronic cerebral blood products identified. Dilated perivascular space versus small chronic lacune in the left internal capsule near the genu is unchanged. Elsewhere normal for age gray and white matter signal. No abnormal enhancement identified.  No dural thickening. Vascular: Major intracranial vascular flow voids are stable. The major dural venous sinuses are enhancing and appear to be patent. Skull and upper cervical spine: Negative visible cervical spine and spinal cord. Visualized bone marrow signal is within normal limits. Sinuses/Orbits: Negative orbits. Visualized paranasal sinuses and mastoids are stable and well pneumatized. Other: Visible internal auditory structures appear normal. No acute scalp soft tissue finding. IMPRESSION: 1. Stable and satisfactory post treatment appearance of the posterior fossa. 2. Stable small 6 mm colloid cyst. 3. No new intracranial abnormality. Electronically Signed   By: Genevie Ann M.D.   On: 01/13/2019 23:50    ASSESSMENT & PLAN:  58 y.o. female with  1. 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 The process appears to extend into the upper cervical canal. Mild obstructive hydrocephalus. Differential includes tumor including metastatic disease and lymphoma. Chronic inflammatory process such as sarcoid is a  consideration however chest x-ray negative. TB and atypical infection/fungus also possible. 6 mm colloid cyst felt to be a separate problem."  07/25/18 CT C/A/P which did not reveal significant abnormality  09/12/18 BM Bx revealed no evidence of lymphoma  09/08/18 PET/CT revealed "No hypermetabolic mass or adenopathy identified within the chest abdomen or pelvis to suggest metabolically active tumor. 2. Nonspecific focus of increased uptake within the cord extending from T12 to L1. Cannot rule out additional site of CNS lymphoma. Consider further evaluation with contrast enhanced MRI through this area. 3. Aortic Atherosclerosis and Emphysema."  10/16/2018 MRI brain w and w/o contrast revealed "1. Resolved dural mass in the right posterior fossa with minimal smooth dural thickening that may be treatment related. Cerebellar edema and mass effect is also resolved. No new site of disease. 2. 6 mm colloid cyst."  10/20/2018 MRI cervical spine w and w/o contrast revealed "Negative for lymphoma. No acute abnormality. Central disc protrusion at C5-6 effaces the ventral thecal sac causing mild central canal narrowing. There is also a shallow disc bulge at C6-7 which narrows but does not efface the ventral thecal Sac."  PLAN: -Discussed pt labwork today, 01/21/19; blood counts are good, pt is hypokalemic  -Discussed 01/21/2019 LDH is normal at 178 -Discussed 01/13/2019 MRI Brain ([id number]) which revealed "1. Stable and satisfactory post treatment appearance of the posterior fossa. 2. Stable small 6 mm colloid cyst. 3. No  new intracranial abnormality." -Pt has no prohibitive toxicities from starting maintenance Rituxan at this time -Maintenance will be one dose of Rituxan every two months, up to two years  -Pt will contact with any issues or changes in symptomology -Will Rx Potassium replacement -Will see back with next treatment  FOLLOW UP: Please schedule next 2 cycles of maintenance Rituxan as per  orders q60 days with labs and MD visits  The total time spent in the appt was 25 minutes and more than 50% was on counseling and direct patient cares.  All of the patient's questions were answered with apparent satisfaction. The patient knows to call the clinic with any problems, questions or concerns.   Sullivan Lone MD [place] AAHIVMS [facility] Hematology/Oncology Physician [facility]  (Office):       [phone] (Work cell):  [phone] (Fax):           [phone]  01/21/2019 10:43 AM  I, Yevette Edwards, am acting as a scribe for Dr. Sullivan Lone.   .I have reviewed the above documentation for accuracy and completeness, and I agree with the above. Brunetta Genera MD

## 2019-01-19 ENCOUNTER — Telehealth: Payer: Self-pay | Admitting: Obstetrics and Gynecology

## 2019-01-19 NOTE — Telephone Encounter (Signed)

## 2019-01-20 ENCOUNTER — Other Ambulatory Visit (HOSPITAL_COMMUNITY)
Admission: RE | Admit: 2019-01-20 | Discharge: 2019-01-20 | Disposition: A | Discharge status: Home / Self Care | Payer: BC Managed Care – PPO | Source: Ambulatory Visit | Attending: Obstetrics and Gynecology | Admitting: Obstetrics and Gynecology

## 2019-01-20 ENCOUNTER — Encounter: Payer: Self-pay | Admitting: Obstetrics and Gynecology

## 2019-01-20 ENCOUNTER — Ambulatory Visit (INDEPENDENT_AMBULATORY_CARE_PROVIDER_SITE_OTHER): Payer: BC Managed Care – PPO | Admitting: Obstetrics and Gynecology

## 2019-01-20 ENCOUNTER — Other Ambulatory Visit: Payer: Self-pay

## 2019-01-20 VITALS — BP 125/73 | Ht 67.5 in | Wt 173.2 lb

## 2019-01-20 DIAGNOSIS — Z01419 Encounter for gynecological examination (general) (routine) without abnormal findings: Secondary | ICD-10-CM

## 2019-01-20 MED ORDER — DOXYCYCLINE HYCLATE 100 MG PO CAPS: 100.0000 mg | ORAL_CAPSULE | Freq: Two times a day (BID) | ORAL | 0 refills | Status: DC

## 2019-01-20 MED ORDER — TRAMADOL HCL 50 MG PO TABS: 50.0000 mg | ORAL_TABLET | Freq: Four times a day (QID) | ORAL | 0 refills | Status: DC | PRN

## 2019-01-20 NOTE — Progress Notes (Signed)
Patient ID: Amber Drake, female   DOB: Apr 28, 1960, 58 y.o.   MRN: [id number]  Assessment:  Annual Gyn Exam Boils on right hidradenitis Constipation Plan:  1. pap smear done, next pap due 3 years 2. Doxycycline x14 days  3    2019-02-05 mammogram 4.  Tramadol 5. F/u in 4 weeks Subjective:  Amber Drake is a 58 y.o. female [phone] who presents for annual exam. Patient's last menstrual period was 08/12/2010. The patient has complaints today of boils on vagina. Has a mammogram 02-05-2019. Daughter died from sickle cell. Son has sickle cell as well but is not as sick as her daughter. Had brain surgery 07/28/2018 done by Dr. Kary Kos  The following portions of the patient's history were reviewed and updated as appropriate: allergies, current medications, past family history, past medical history, past social history, past surgical history and problem list. Past Medical History:  Diagnosis Date  . Anemia   . Arthritis   . Blood dyscrasia    sickle cell trait  . Carbuncle and furuncle   . Hypercholesterolemia   . Hypertension    does not take meds  . Sickle cell trait [facility])     Past Surgical History:  Procedure Laterality Date  . APPENDECTOMY    . APPLICATION OF CRANIAL NAVIGATION N/A 07/28/2018   Procedure: APPLICATION OF CRANIAL NAVIGATION;  Surgeon: Kary Kos, MD;  Location: [facility];  Service: Neurosurgery;  Laterality: N/A;  . BRAIN SURGERY    . CARPAL TUNNEL RELEASE  03/23/2011   Procedure: CARPAL TUNNEL RELEASE;  Surgeon: Sanjuana Kava;  [facility];  Service: Orthopedics;  Laterality: Left;  . CARPAL TUNNEL RELEASE  05/10/2011   Procedure: CARPAL TUNNEL RELEASE;  Surgeon: Sanjuana Kava, MD;  [facility];  Service: Orthopedics;  Laterality: Right;  . CESAREAN SECTION     x 2  . INCISION AND DRAINAGE PERIRECTAL ABSCESS  12/21/09  . PR DURAL GRAFT REPAIR,SPINE DEFECT N/A 07/28/2018   Procedure: Stereotactic open biopsy of Right cerebellar hemisphere and dura with  brainlab;  Surgeon: Kary Kos, MD;  Location: [facility];  Service: Neurosurgery;  Laterality: N/A;  Stereotactic open biopsy of Right cerebellar hemisphere and dura with brainlab  . PROCTOSCOPY  10/17/2010   Procedure: PROCTOSCOPY;  Surgeon: Scherry Ran;  [facility];  Service: General;  Laterality: N/A;  Rigid Proctoscopy/Possible Fistula in Ano  Procedure ended at 1003  . THERAPEUTIC ABORTION     x2     Current Outpatient Medications:  .  albuterol (PROAIR HFA) 108 (90 Base) MCG/ACT inhaler, Inhale 2 puffs into the lungs 4 (four) times daily as needed., Disp: , Rfl:  .  atorvastatin (LIPITOR) 20 MG tablet, Take 1 tablet (20 mg total) by mouth daily., Disp: 90 tablet, Rfl: 2 .  cyclobenzaprine (FLEXERIL) 10 MG tablet, Take 1 tablet (10 mg total) by mouth 3 (three) times daily as needed for muscle spasms., Disp: 40 tablet, Rfl: 0 .  ferrous sulfate 325 (65 FE) MG tablet, Take 325 mg by mouth daily with breakfast., Disp: , Rfl:  .  lisinopril-hydrochlorothiazide (PRINZIDE,ZESTORETIC) 20-12.5 MG tablet, Take 1 tablet by mouth daily., Disp: , Rfl:  .  Multiple Vitamin (MULTIVITAMIN WITH MINERALS) TABS tablet, Take 1 tablet by mouth daily., Disp: , Rfl:  .  dexamethasone (DECADRON) 1 MG tablet, 1mg  po daily with breakfast till 8/24 then 1mg  po every other day for 10 days then stop (Patient not taking: Reported on 01/20/2019), Disp: 30 tablet, Rfl: 0 .  HYDROcodone-acetaminophen (NORCO/VICODIN) 5-325 MG tablet, Take 1 tablet by mouth every 4 (four) hours as needed. (Patient not taking: Reported on 01/20/2019), Disp: 15 tablet, Rfl: 0 .  potassium chloride SA (K-DUR) 20 MEQ tablet, 40 meq po twice daily for 5 days then 97meq po daily for 2 weeks and rpt labs with PCP (Patient not taking: Reported on 01/20/2019), Disp: 60 tablet, Rfl: 0 .  ranitidine (ZANTAC) 150 MG capsule, Take 150 mg by mouth 2 (two) times daily., Disp: , Rfl:   Review of Systems Constitutional: negative Gastrointestinal:  negative Genitourinary: normal  Objective:  BP 125/73 (BP Location: Right Arm, Patient Position: Sitting, Cuff Size: Normal)   Ht 5' 7.5" (1.715 m)   Wt 173 lb 3.2 oz (78.6 kg)   LMP 08/12/2010   BMI 26.73 kg/m    BMI: Body mass index is 26.73 kg/m.  General Appearance: Alert, appropriate appearance for age. No acute distress HEENT: Grossly normal Neck / Thyroid:  Cardiovascular: RRR; normal S1, S2, no murmur Lungs: CTA bilaterally Back: No CVAT Breast Exam: Not examined Gastrointestinal: Soft, non-tender, no masses or organomegaly Pelvic Exam: External genitalia: right hidradenitis, 4 multiple sinus boils VAGINA: strong vaginal muscle tone at entrance, CERVIX: normal appearing cervix UTERUS: uterus is normal RECTAL: normal rectal, guaiac negative stool obtained PAP: Pap smear done today. Lymphatic Exam: Non-palpable nodes in neck, clavicular, axillary, or inguinal regions  Skin: no rash or abnormalities Neurologic: Normal gait and speech, no tremor  Psychiatric: Alert and oriented, appropriate affect.  Urinalysis:Not done  By signing my name below, I, Samul Dada, attest that this documentation has been prepared under the direction and in the presence of Jonnie Kind, MD. Electronically Signed: [place]. 01/20/19. 4:00 PM.  I personally performed the services described in this documentation, which was SCRIBED in my presence. The recorded information has been reviewed and considered accurate. It has been edited as necessary during review. Jonnie Kind, MD

## 2019-01-21 ENCOUNTER — Inpatient Hospital Stay: Payer: BC Managed Care – PPO | Attending: Internal Medicine

## 2019-01-21 ENCOUNTER — Encounter: Payer: Self-pay | Admitting: Hematology

## 2019-01-21 ENCOUNTER — Inpatient Hospital Stay (HOSPITAL_BASED_OUTPATIENT_CLINIC_OR_DEPARTMENT_OTHER): Payer: BC Managed Care – PPO | Admitting: Hematology

## 2019-01-21 ENCOUNTER — Inpatient Hospital Stay: Payer: BC Managed Care – PPO

## 2019-01-21 ENCOUNTER — Other Ambulatory Visit: Payer: Self-pay

## 2019-01-21 VITALS — BP 121/69 | HR 72 | Temp 98.3°F | Resp 17

## 2019-01-21 VITALS — BP 140/57 | HR 69 | Temp 98.0°F | Resp 18 | Ht 67.5 in | Wt 175.4 lb

## 2019-01-21 DIAGNOSIS — Z79899 Other long term (current) drug therapy: Secondary | ICD-10-CM | POA: Insufficient documentation

## 2019-01-21 DIAGNOSIS — C884 Extranodal marginal zone B-cell lymphoma of mucosa-associated lymphoid tissue [MALT-lymphoma]: Secondary | ICD-10-CM | POA: Diagnosis not present

## 2019-01-21 DIAGNOSIS — I10 Essential (primary) hypertension: Secondary | ICD-10-CM | POA: Insufficient documentation

## 2019-01-21 DIAGNOSIS — D573 Sickle-cell trait: Secondary | ICD-10-CM | POA: Insufficient documentation

## 2019-01-21 DIAGNOSIS — E876 Hypokalemia: Secondary | ICD-10-CM | POA: Insufficient documentation

## 2019-01-21 DIAGNOSIS — E7800 Pure hypercholesterolemia, unspecified: Secondary | ICD-10-CM | POA: Insufficient documentation

## 2019-01-21 DIAGNOSIS — F17210 Nicotine dependence, cigarettes, uncomplicated: Secondary | ICD-10-CM | POA: Diagnosis not present

## 2019-01-21 DIAGNOSIS — R519 Headache, unspecified: Secondary | ICD-10-CM | POA: Diagnosis not present

## 2019-01-21 DIAGNOSIS — Z7189 Other specified counseling: Secondary | ICD-10-CM

## 2019-01-21 DIAGNOSIS — Z5112 Encounter for antineoplastic immunotherapy: Secondary | ICD-10-CM | POA: Insufficient documentation

## 2019-01-21 LAB — CBC WITH DIFFERENTIAL/PLATELET
Abs Immature Granulocytes: 0.02 10*3/uL (ref 0.00–0.07)
Basophils Absolute: 0 10*3/uL (ref 0.0–0.1)
Basophils Relative: 0 %
Eosinophils Absolute: 0.1 10*3/uL (ref 0.0–0.5)
Eosinophils Relative: 1 %
HCT: 35.7 % — ABNORMAL LOW (ref 36.0–46.0)
Hemoglobin: 11.9 g/dL — ABNORMAL LOW (ref 12.0–15.0)
Immature Granulocytes: 0 %
Lymphocytes Relative: 25 %
Lymphs Abs: 1.7 10*3/uL (ref 0.7–4.0)
MCH: 28.7 pg (ref 26.0–34.0)
MCHC: 33.3 g/dL (ref 30.0–36.0)
MCV: 86 fL (ref 80.0–100.0)
Monocytes Absolute: 0.5 10*3/uL (ref 0.1–1.0)
Monocytes Relative: 7 %
Neutro Abs: 4.5 10*3/uL (ref 1.7–7.7)
Neutrophils Relative %: 67 %
Platelets: 319 10*3/uL (ref 150–400)
RBC: 4.15 MIL/uL (ref 3.87–5.11)
RDW: 12.2 % (ref 11.5–15.5)
WBC: 6.8 10*3/uL (ref 4.0–10.5)
nRBC: 0 % (ref 0.0–0.2)

## 2019-01-21 LAB — CMP (CANCER CENTER ONLY)
ALT: 10 U/L (ref 0–44)
AST: 15 U/L (ref 15–41)
Albumin: 3.6 g/dL (ref 3.5–5.0)
Alkaline Phosphatase: 57 U/L (ref 38–126)
Anion gap: 14 (ref 5–15)
BUN: 10 mg/dL (ref 6–20)
CO2: 26 mmol/L (ref 22–32)
Calcium: 10.3 mg/dL (ref 8.9–10.3)
Chloride: 102 mmol/L (ref 98–111)
Creatinine: 0.81 mg/dL (ref 0.44–1.00)
GFR, Est AFR Am: >60 mL/min (ref >60)
GFR, Estimated: >60 mL/min (ref >60)
Glucose, Bld: 96 mg/dL (ref 70–99)
Potassium: 3.1 mmol/L — ABNORMAL LOW (ref 3.5–5.1)
Sodium: 142 mmol/L (ref 135–145)
Total Bilirubin: 0.3 mg/dL (ref 0.3–1.2)
Total Protein: 7.2 g/dL (ref 6.5–8.1)

## 2019-01-21 LAB — LACTATE DEHYDROGENASE: LDH: 178 U/L (ref 98–192)

## 2019-01-21 MED ORDER — ACETAMINOPHEN 325 MG PO TABS
650.0000 mg | ORAL_TABLET | Freq: Once | ORAL | Status: AC
  Administered 2019-01-21: 650 mg via ORAL

## 2019-01-21 MED ORDER — SODIUM CHLORIDE 0.9 % IV SOLN
Freq: Once | INTRAVENOUS | Status: AC
  Administered 2019-01-21: 10:00:00 via INTRAVENOUS
  Filled 2019-01-21: qty 250

## 2019-01-21 MED ORDER — FAMOTIDINE IN NACL 20-0.9 MG/50ML-% IV SOLN
INTRAVENOUS | Status: AC
  Filled 2019-01-21: qty 50

## 2019-01-21 MED ORDER — ACETAMINOPHEN 325 MG PO TABS
ORAL_TABLET | ORAL | Status: AC
  Filled 2019-01-21: qty 2

## 2019-01-21 MED ORDER — DEXAMETHASONE SODIUM PHOSPHATE 10 MG/ML IJ SOLN
INTRAMUSCULAR | Status: AC
  Filled 2019-01-21: qty 1

## 2019-01-21 MED ORDER — POTASSIUM CHLORIDE CRYS ER 20 MEQ PO TBCR: 20.0000 meq | EXTENDED_RELEASE_TABLET | Freq: Two times a day (BID) | ORAL | 1 refills | Status: DC

## 2019-01-21 MED ORDER — DIPHENHYDRAMINE HCL 25 MG PO CAPS
50.0000 mg | ORAL_CAPSULE | Freq: Once | ORAL | Status: AC
  Administered 2019-01-21: 11:00:00 50 mg via ORAL

## 2019-01-21 MED ORDER — DEXAMETHASONE SODIUM PHOSPHATE 10 MG/ML IJ SOLN
10.0000 mg | Freq: Once | INTRAMUSCULAR | Status: AC
  Administered 2019-01-21: 10 mg via INTRAVENOUS

## 2019-01-21 MED ORDER — SODIUM CHLORIDE 0.9 % IV SOLN
375.0000 mg/m2 | Freq: Once | INTRAVENOUS | Status: AC
  Administered 2019-01-21: 700 mg via INTRAVENOUS
  Filled 2019-01-21: qty 50

## 2019-01-21 MED ORDER — FAMOTIDINE IN NACL 20-0.9 MG/50ML-% IV SOLN
20.0000 mg | Freq: Once | INTRAVENOUS | Status: AC
  Administered 2019-01-21: 20 mg via INTRAVENOUS

## 2019-01-21 MED ORDER — DIPHENHYDRAMINE HCL 25 MG PO CAPS
ORAL_CAPSULE | ORAL | Status: AC
  Filled 2019-01-21: qty 2

## 2019-01-21 NOTE — Progress Notes (Signed)
Met with patient in lobby to introduce myself as Arboriculturist and to offer available resources.  Discussed one-time $71 Engineer, drilling to assist with personal expenses while going through treatment.  Approved patient based on special circumstance having just lost child 9/17. Patient has the approval letter as well as the expense sheet and Outpatient pharmacy information. Explained in detail how the expenses are covered. She verbalized understanding.  Gave her the Duanne Limerick application for patients whom live in [place] only. Emphasized what is needed to complete application and advised she may return to me to email in for her. She verbalized understanding.  Asked patient if she has met her ded/OOP for insurance to determine if copay assistance is needed for Rituxan. Patient states she thinks she has. Advised we can apply if she has not.  Gave her my card for any additional financial questions or concerns.

## 2019-01-21 NOTE — Patient Instructions (Signed)
[facility] Discharge Instructions for Patients Receiving Chemotherapy  Today you received the following chemotherapy agents Rituximab--pvvr  To help prevent nausea and vomiting after your treatment, we encourage you to take your nausea medication as directed   If you develop nausea and vomiting that is not controlled by your nausea medication, call the clinic.   BELOW ARE SYMPTOMS THAT SHOULD BE REPORTED IMMEDIATELY:  *FEVER GREATER THAN 100.5 F  *CHILLS WITH OR WITHOUT FEVER  NAUSEA AND VOMITING THAT IS NOT CONTROLLED WITH YOUR NAUSEA MEDICATION  *UNUSUAL SHORTNESS OF BREATH  *UNUSUAL BRUISING OR BLEEDING  TENDERNESS IN MOUTH AND THROAT WITH OR WITHOUT PRESENCE OF ULCERS  *URINARY PROBLEMS  *BOWEL PROBLEMS  UNUSUAL RASH Items with * indicate a potential emergency and should be followed up as soon as possible.  Feel free to call the clinic should you have any questions or concerns. The clinic phone number is [phone].  Please show the CHEMO ALERT CARD at check-in to the Emergency Department and triage nurse.   

## 2019-01-22 ENCOUNTER — Telehealth: Payer: Self-pay | Admitting: Hematology

## 2019-01-22 NOTE — Telephone Encounter (Signed)
No los per 10/21. °

## 2019-01-23 ENCOUNTER — Ambulatory Visit (HOSPITAL_COMMUNITY): Payer: BC Managed Care – PPO

## 2019-01-27 ENCOUNTER — Telehealth: Payer: Self-pay | Admitting: Hematology

## 2019-01-27 ENCOUNTER — Other Ambulatory Visit: Payer: Self-pay | Admitting: Hematology

## 2019-01-27 NOTE — Telephone Encounter (Signed)
Scheduled appt per 10/27 sch message - pt is aware of appt date and time.   

## 2019-01-29 ENCOUNTER — Other Ambulatory Visit: Payer: Self-pay

## 2019-01-29 ENCOUNTER — Ambulatory Visit (HOSPITAL_COMMUNITY)
Admission: RE | Admit: 2019-01-29 | Discharge: 2019-01-29 | Disposition: A | Discharge status: Home / Self Care | Payer: BC Managed Care – PPO | Source: Ambulatory Visit | Attending: Internal Medicine | Admitting: Internal Medicine

## 2019-01-29 DIAGNOSIS — Z1231 Encounter for screening mammogram for malignant neoplasm of breast: Secondary | ICD-10-CM | POA: Diagnosis not present

## 2019-02-04 LAB — CYTOLOGY - PAP
Chlamydia: NEGATIVE
Comment: NEGATIVE
Comment: NEGATIVE
Comment: NEGATIVE
Comment: NORMAL
Diagnosis: NEGATIVE
HPV 16: NEGATIVE
HPV 18 / 45: NEGATIVE
High risk HPV: POSITIVE — AB
Neisseria Gonorrhea: NEGATIVE

## 2019-02-09 ENCOUNTER — Telehealth: Payer: Self-pay | Admitting: *Deleted

## 2019-02-09 NOTE — Telephone Encounter (Signed)
Patient informed pap +HPV and will need repeat in 1 year.  Pt verbalized gratitude and understanding.

## 2019-02-20 ENCOUNTER — Ambulatory Visit: Payer: BC Managed Care – PPO | Admitting: Obstetrics and Gynecology

## 2019-02-23 ENCOUNTER — Telehealth: Payer: Self-pay | Admitting: Obstetrics and Gynecology

## 2019-02-23 NOTE — Telephone Encounter (Signed)

## 2019-02-24 ENCOUNTER — Ambulatory Visit: Payer: BC Managed Care – PPO | Admitting: Obstetrics and Gynecology

## 2019-03-13 ENCOUNTER — Other Ambulatory Visit: Payer: Self-pay | Admitting: Hematology

## 2019-03-13 ENCOUNTER — Telehealth: Payer: Self-pay

## 2019-03-13 DIAGNOSIS — C884 Extranodal marginal zone B-cell lymphoma of mucosa-associated lymphoid tissue [MALT-lymphoma]: Secondary | ICD-10-CM

## 2019-03-13 MED ORDER — HYDROCODONE-ACETAMINOPHEN 5-325 MG PO TABS: 1.0000 | ORAL_TABLET | ORAL | 0 refills | Status: DC | PRN

## 2019-03-13 NOTE — Telephone Encounter (Signed)
-----   Message from Brunetta Genera, MD sent at 03/13/2019  1:33 PM EST ----- I called patient and discussed her symptoms since she has had a previous CNS lymphoma. She was prescribed Vicodin and she does have a follow-up with [place] on 03/24/2019. Did not want to get additional imaging at this time. Thanks [place] ----- Message ----- From: Tami Lin, RN Sent: 03/13/2019  11:29 AM EST To: Brunetta Genera, MD  Patient said she has headaches frequently and has tried Tylenol and other OTC meds without relief. She wants to know if you will send in a prescription for something stronger.  Thanks, Lanelle Bal

## 2019-03-13 NOTE — Progress Notes (Signed)
Patient called with headaches not responding to Tylenol and over-the-counter NSAIDs. She notes that she has no new focal neurological deficits and has been doing well overall. Headaches are happening on and off and are not persistent or progressive. No vision changes. Did not feel like her previous lymphoma. Feels they are primarily related to stress and neck pains. She request something stronger. Appears she was on Vicodin previously and that helped. This was refilled. Discussed pros and cons of getting a repeat MRI. Patient notes that she would not want to get repeat imaging at this time and "feels fine". She has a follow-up with [place] on 03/24/2019 at which point we will reassess her.

## 2019-03-23 NOTE — Progress Notes (Signed)
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 female who has been referred to [place] by my colleague Dr. Cecil Cobbs in Neuro-Oncology for evaluation and management of her Newly diagnosed Extranodal Marginal Zone Lymphoma. She is accompanied today by her sister [name] via [employer]. The pt reports that she is doing well overall.  Prior to today's visit, the pt presented to the ED on 07/21/18 with complaints of a headache for the past month which presented intermittently and was worsened by bending, coughing, or laughing. She had a CT Head and MRI Brain which revealed extensive dural thickening, as noted below. She then had a right posterior fossa craniectomy on 07/28/18 for open exicisional biopsies of the right cerebellar hemisphere and dura which revealed Extranodal marginal zone lymphoma. The pt then established care with my colleague Dr. Cecil Cobbs in Neuro-Onc on 08/11/18. She has been taking 4mg  Decadron daily.  The pt reports that developed worsened headaches about a month ago, and notes that when she laughed it felt like "electiricty" was running through the sides and back of her head. She notes that some mild headaches had begun a couple months ago. Denies double vision, changes in vision, loss of balance, or other senses being affected. The pt notes that in addition to this, she developed redness in the eyes in the past couple months, saw eye doctor and was prescribed eye drops. Denies other concerns in the last 6 months including bone pains, fatigue, fevers, chills, night sweats or unexpected weight loss. She notes that her headaches have resolved. She notes she began Decadron after her biopsy. She denies having "any symptoms  whatsoever," currently.   Of note prior to the patient's visit today, pt has had an MRI Brain completed on 07/21/18 with results revealing "Extensive dural thickening surrounding the right cerebellar hemisphere with mass-effect and edema in the right cerebellum. The process appears to extend into the upper cervical canal. Mild obstructive hydrocephalus. Differential includes tumor including metastatic disease and lymphoma. Chronic inflammatory process such as sarcoid is a consideration however chest x-ray negative. TB and atypical infection/fungus also possible. 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  On review of systems, pt reports good energy levels, headache resolution, and denies joint issues, skin rashes, dry mouth, dry eyes, fevers, chills, night sweats, bone pains, unexpected weight loss, fatigue, double vision, loss of vision, affected senses, loss of balance, and any other symptoms.   On PMHx the pt reports sickle cel trait, HTN, hyperlipidemia. On Social Hx the pt reports that she smoked 1 ppd for the last 42 years, and reports having quit two days ago. 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 On Family Hx the pt reports dad with lupus and sister with sarcoidosis. Daughter and son with sickle cell disease. Denies other blood disorders or cancers. Endorses Penicillin allergy  Interval History:   Amber Drake returns today for management and evaluation of her CNS Extranodal Marginal Zone Lymphoma.  She is here for her maintenance Rituxan. The patient's last visit with [place] was on 01/21/2019. The pt reports that she is doing well overall.  The pt reports that her arms, shoulder, and legs have been hurting for the past week.  It began when the pt did a headstand and heard a crack in her neck. The pain then began in her neck and the right side of her head. This transformed into shoulder pain that radiates down her right arm, as well as leg pain that is throbbing in nature. Pt's PCP is Dr. Legrand Rams but she has not been to see them since she injured her neck. To combat these symptoms pt took one of her son's prescription pain pills. Pt also has low back pain which she attributes to the lifting that she does at her job. She is still having a low-level headache.   Pt is currently on a diuretic and has continued to take Potassium as recommended.   Lab results today (03/24/19) of CBC w/diff and CMP is as follows: all values are WNL except for PLT at 401K, Glucose at 166. 03/24/2019 LDH at 150  On review of systems, pt reports headache, low back pain, neck pain, shoulder/arm pain, leg pain and denies fevers, chills, night sweats, abdominal pain, vision changes, balance issues, hand weakness and any other symptoms.   MEDICAL HISTORY:  Past Medical History:  Diagnosis Date  . Anemia   . Arthritis   . Blood dyscrasia    sickle cell trait  . Carbuncle and furuncle   . Hypercholesterolemia   . Hypertension    does not take meds  . Sickle cell trait ([place])     SURGICAL HISTORY: Past Surgical History:  Procedure Laterality Date  . APPENDECTOMY    . APPLICATION OF CRANIAL NAVIGATION N/A 07/28/2018   Procedure: APPLICATION OF CRANIAL NAVIGATION;  Surgeon: Kary Kos, MD;  Location: [facility];  Service: Neurosurgery;  Laterality: N/A;  . BRAIN SURGERY    . CARPAL TUNNEL RELEASE  03/23/2011   Procedure: CARPAL TUNNEL RELEASE;  Surgeon: Sanjuana Kava;  Location: [facility];  Service: Orthopedics;  Laterality: Left;  . CARPAL TUNNEL RELEASE  05/10/2011   Procedure: CARPAL TUNNEL RELEASE;  Surgeon: Sanjuana Kava, MD;  Location: [facility];  Service: Orthopedics;  Laterality: Right;  . CESAREAN SECTION     x 2  . INCISION AND DRAINAGE  PERIRECTAL ABSCESS  12/21/09  . PR DURAL GRAFT REPAIR,SPINE DEFECT N/A 07/28/2018   Procedure: Stereotactic open biopsy of Right cerebellar hemisphere and dura with brainlab;  Surgeon: Kary Kos, MD;  Location: [facility];  Service: Neurosurgery;  Laterality: N/A;  Stereotactic open biopsy of Right cerebellar hemisphere and dura with brainlab  . PROCTOSCOPY  10/17/2010   Procedure: PROCTOSCOPY;  Surgeon: Scherry Ran;  Location: [facility];  Service: General;  Laterality: N/A;  Rigid Proctoscopy/Possible Fistula in Ano  Procedure ended at 1003  . THERAPEUTIC ABORTION     x2    SOCIAL HISTORY: Social History   Socioeconomic History  . Marital status: Legally Separated    Spouse name: Not on file  . Number of children: Not on file  . Years of education: Not on file  . Highest education level: Not on file  Occupational History  . Not on file  Tobacco Use  . Smoking status: Current Every Day Smoker    Packs/day: 0.50    Years: 30.00    Pack years: 15.00    Types: Cigarettes  . Smokeless  tobacco: Never Used  Substance and Sexual Activity  . Alcohol use: No  . 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 Sexual activity: Yes    Partners: Male    Birth control/protection: None  Other Topics Concern  . Not on file  Social History Narrative  . Not on file   Social Determinants of Health   Financial Resource Strain:   . Difficulty of Paying Living Expenses: Not on file  Food Insecurity:   . Worried About Charity fundraiser in the Last Year: Not on file  . Ran Out of Food in the Last Year: Not on file  Transportation Needs:   . Lack of Transportation (Medical): Not on file  . Lack of Transportation (Non-Medical): Not on file  Physical Activity:   . Days of Exercise per Week: Not on file  . Minutes of Exercise per Session: Not on file  Stress:   . Feeling of Stress : Not on file  Social Connections:   . Frequency of Communication with Friends and Family: Not on file  .  Frequency of Social Gatherings with Friends and Family: Not on file  . Attends Religious Services: Not on file  . Active Member of Clubs or Organizations: Not on file  . Attends Archivist Meetings: Not on file  . Marital Status: Not on file  Intimate Partner Violence:   . Fear of Current or Ex-Partner: Not on file  . Emotionally Abused: Not on file  . Physically Abused: Not on file  . Sexually Abused: Not on file    FAMILY HISTORY: Family History  Problem Relation Age of Onset  . Kidney failure Daughter   . Sickle cell anemia Daughter   . Sickle cell trait Daughter   . Sickle cell anemia Son   . Hypertension Mother   . Hyperlipidemia Mother   . Hypertension Father   . Lupus Father        skin  . Hypertension Other   . Sarcoidosis Sister   . Anesthesia problems Neg Hx   . Hypotension Neg Hx   . Malignant hyperthermia Neg Hx   . Pseudochol deficiency Neg Hx     ALLERGIES:  is allergic to penicillins; penicillins cross reactors; and tape.  MEDICATIONS:  Current Outpatient Medications  Medication Sig Dispense Refill  . albuterol (PROAIR HFA) 108 (90 Base) MCG/ACT inhaler Inhale 2 puffs into the lungs 4 (four) times daily as needed.    Marland Kitchen atorvastatin (LIPITOR) 20 MG tablet Take 1 tablet (20 mg total) by mouth daily. 90 tablet 2  . cyclobenzaprine (FLEXERIL) 10 MG tablet Take 1 tablet (10 mg total) by mouth 3 (three) times daily as needed for muscle spasms. 40 tablet 0  . dexamethasone (DECADRON) 1 MG tablet 1mg  po daily with breakfast till 8/24 then 1mg  po every other day for 10 days then stop 30 tablet 0  . doxycycline (VIBRAMYCIN) 100 MG capsule Take 1 capsule (100 mg total) by mouth 2 (two) times daily. 28 capsule 0  . ferrous sulfate 325 (65 FE) MG tablet Take 325 mg by mouth daily with breakfast.    . HYDROcodone-acetaminophen (NORCO/VICODIN) 5-325 MG tablet Take 1 tablet by mouth every 4 (four) hours as needed (for severe headaches.). 30 tablet 0  .  lisinopril-hydrochlorothiazide (PRINZIDE,ZESTORETIC) 20-12.5 MG tablet Take 1 tablet by mouth daily.    . Multiple Vitamin (MULTIVITAMIN WITH MINERALS) TABS tablet Take 1 tablet by mouth daily.    Marland Kitchen  potassium chloride SA (KLOR-CON) 20 MEQ tablet Take 1 tablet (20 mEq total) by mouth 2 (two) times daily. 60 tablet 1  . ranitidine (ZANTAC) 150 MG capsule Take 150 mg by mouth 2 (two) times daily.    . traMADol (ULTRAM) 50 MG tablet Take 1 tablet (50 mg total) by mouth every 6 (six) hours as needed for moderate pain or severe pain. 30 tablet 0   No current facility-administered medications for this visit.    REVIEW OF SYSTEMS:   A 10+ POINT REVIEW OF SYSTEMS WAS OBTAINED including neurology, dermatology, psychiatry, cardiac, respiratory, lymph, extremities, GI, GU, Musculoskeletal, constitutional, breasts, reproductive, HEENT.  All pertinent positives are noted in the HPI.  All others are negative.   PHYSICAL EXAMINATION: ECOG PERFORMANCE STATUS: 1 - Symptomatic but completely ambulatory  . Vitals:   03/24/19 0937  BP: 126/67  Pulse: 81  Resp: 18  Temp: 98 F (36.7 C)  SpO2: 100%   Filed Weights   03/24/19 0937  Weight: 175 lb 9.6 oz (79.7 kg)   .Body mass index is 27.1 kg/m.   GENERAL:alert, in no acute distress and comfortable SKIN: no acute rashes, no significant lesions EYES: conjunctiva are pink and non-injected, sclera anicteric OROPHARYNX: MMM, no exudates, no oropharyngeal erythema or ulceration NECK: supple, no JVD LYMPH:  no palpable lymphadenopathy in the cervical, axillary or inguinal regions LUNGS: clear to auscultation b/l with normal respiratory effort HEART: regular rate & rhythm ABDOMEN:  normoactive bowel sounds , non tender, not distended. No palpable hepatosplenomegaly.  Extremity: no pedal edema PSYCH: alert & oriented x 3 with fluent speech NEURO: no focal motor/sensory deficits  LABORATORY DATA:  I have reviewed the data as listed  . CBC Latest Ref  Rng & Units 03/24/2019 01/21/2019 11/25/2018  WBC 4.0 - 10.5 K/uL 5.6 6.8 7.8  Hemoglobin 12.0 - 15.0 g/dL 12.9 11.9(L) 12.6  Hematocrit 36.0 - 46.0 % 38.7 35.7(L) 37.5  Platelets 150 - 400 K/uL 401(H) 319 238    . CMP Latest Ref Rng & Units 03/24/2019 01/21/2019 01/13/2019  Glucose 70 - 99 mg/dL 166(H) 96 -  BUN 6 - 20 mg/dL 12 10 -  Creatinine 0.44 - 1.00 mg/dL 0.81 0.81 0.70  Sodium 135 - 145 mmol/L 143 142 -  Potassium 3.5 - 5.1 mmol/L 3.6 3.1(L) -  Chloride 98 - 111 mmol/L 107 102 -  CO2 22 - 32 mmol/L 22 26 -  Calcium 8.9 - 10.3 mg/dL 9.6 10.3 -  Total Protein 6.5 - 8.1 g/dL 7.0 7.2 -  Total Bilirubin 0.3 - 1.2 mg/dL 0.3 0.3 -  Alkaline Phos 38 - 126 U/L 74 57 -  AST 15 - 41 U/L 18 15 -  ALT 0 - 44 U/L 16 10 -    09/12/18 BM Biopsy:    07/28/18 Right cerebellar hemisphere and dura biopsy:     RADIOGRAPHIC STUDIES: I have personally reviewed the radiological images as listed and agreed with the findings in the report. No results found.  ASSESSMENT & PLAN:  57 y.o. female with  1. 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 The process appears to extend  into the upper cervical canal. Mild obstructive hydrocephalus. Differential includes tumor including metastatic disease and lymphoma. Chronic inflammatory process such as sarcoid is a consideration however chest x-ray negative. TB and atypical infection/fungus also possible. 6 mm colloid cyst felt to be a separate problem."  07/25/18 CT C/A/P which did not reveal significant  abnormality  09/12/18 BM Bx revealed no evidence of lymphoma  09/08/18 PET/CT revealed "No hypermetabolic mass or adenopathy identified within the chest abdomen or pelvis to suggest metabolically active tumor. 2. Nonspecific focus of increased uptake within the cord extending from T12 to L1. Cannot rule out additional site of CNS lymphoma. Consider further evaluation with contrast enhanced MRI through this area. 3. Aortic Atherosclerosis and Emphysema."  10/16/2018 MRI brain w and w/o contrast revealed "1. Resolved dural mass in the right posterior fossa with minimal smooth dural thickening that may be treatment related. Cerebellar edema and mass effect is also resolved. No new site of disease. 2. 6 mm colloid cyst."  10/20/2018 MRI cervical spine w and w/o contrast revealed "Negative for lymphoma. No acute abnormality. Central disc protrusion at C5-6 effaces the ventral thecal sac causing mild central canal narrowing. There is also a shallow disc bulge at C6-7 which narrows but does not efface the ventral thecal Sac."  01/13/2019 MRI Brain ([id number]) revealed "1. Stable and satisfactory post treatment appearance of the posterior fossa. 2. Stable small 6 mm colloid cyst. 3. No new intracranial abnormality."  PLAN: -Discussed pt labwork today, 03/24/19; all values are WNL except for PLT at 401K, Glucose at 166. -Discussed 03/24/2019 LDH is WNL at 150 -Pt has no prohibitive toxicities from continuing maintenance Rituxan at this time  -Maintenance will be one dose of Rituxan every two months, up to two years  -Pt will contact with any issues or changes in symptomology -Continue Potassium  -Continue Tramadol as needed for pain -Will get an MRI Brain and MRI Cervical Spine in 7-10 days -Will see back in 2 weeks via phone   FOLLOW UP: MRI brain and MRI cervical spine in 7-10 days Phone visit in 2 weeks with Dr Irene Limbo  The total time spent in the appt was 25 minutes and more than 50% was on  counseling and direct patient cares.  All of the patient's questions were answered with apparent satisfaction. The patient knows to call the clinic with any problems, questions or concerns.   Sullivan Lone MD [place] AAHIVMS [facility] Hematology/Oncology Physician [facility]  (Office):       [phone] (Work cell):  [phone] (Fax):           [phone]  03/24/2019 10:29 AM  I, Yevette Edwards, am acting as a scribe for Dr. Sullivan Lone.   .I have reviewed the above documentation for accuracy and completeness, and I agree with the above. Brunetta Genera MD

## 2019-03-24 ENCOUNTER — Inpatient Hospital Stay: Payer: BC Managed Care – PPO | Attending: Internal Medicine

## 2019-03-24 ENCOUNTER — Inpatient Hospital Stay (HOSPITAL_BASED_OUTPATIENT_CLINIC_OR_DEPARTMENT_OTHER): Payer: BC Managed Care – PPO | Admitting: Hematology

## 2019-03-24 ENCOUNTER — Encounter: Payer: Self-pay | Admitting: Urology

## 2019-03-24 ENCOUNTER — Other Ambulatory Visit: Payer: Self-pay

## 2019-03-24 ENCOUNTER — Inpatient Hospital Stay: Payer: BC Managed Care – PPO

## 2019-03-24 VITALS — BP 130/70 | HR 74 | Temp 97.9°F | Resp 16

## 2019-03-24 VITALS — BP 126/67 | HR 81 | Temp 98.0°F | Resp 18 | Ht 67.5 in | Wt 175.6 lb

## 2019-03-24 DIAGNOSIS — C884 Extranodal marginal zone B-cell lymphoma of mucosa-associated lymphoid tissue [MALT-lymphoma]: Secondary | ICD-10-CM | POA: Diagnosis not present

## 2019-03-24 DIAGNOSIS — I10 Essential (primary) hypertension: Secondary | ICD-10-CM | POA: Diagnosis not present

## 2019-03-24 DIAGNOSIS — D573 Sickle-cell trait: Secondary | ICD-10-CM | POA: Insufficient documentation

## 2019-03-24 DIAGNOSIS — R519 Headache, unspecified: Secondary | ICD-10-CM | POA: Diagnosis not present

## 2019-03-24 DIAGNOSIS — F17210 Nicotine dependence, cigarettes, uncomplicated: Secondary | ICD-10-CM | POA: Insufficient documentation

## 2019-03-24 DIAGNOSIS — M1990 Unspecified osteoarthritis, unspecified site: Secondary | ICD-10-CM | POA: Insufficient documentation

## 2019-03-24 DIAGNOSIS — E7800 Pure hypercholesterolemia, unspecified: Secondary | ICD-10-CM | POA: Diagnosis not present

## 2019-03-24 DIAGNOSIS — Z5112 Encounter for antineoplastic immunotherapy: Secondary | ICD-10-CM | POA: Insufficient documentation

## 2019-03-24 DIAGNOSIS — Z79899 Other long term (current) drug therapy: Secondary | ICD-10-CM | POA: Diagnosis not present

## 2019-03-24 DIAGNOSIS — Z7189 Other specified counseling: Secondary | ICD-10-CM

## 2019-03-24 LAB — CMP (CANCER CENTER ONLY)
ALT: 16 U/L (ref 0–44)
AST: 18 U/L (ref 15–41)
Albumin: 3.7 g/dL (ref 3.5–5.0)
Alkaline Phosphatase: 74 U/L (ref 38–126)
Anion gap: 14 (ref 5–15)
BUN: 12 mg/dL (ref 6–20)
CO2: 22 mmol/L (ref 22–32)
Calcium: 9.6 mg/dL (ref 8.9–10.3)
Chloride: 107 mmol/L (ref 98–111)
Creatinine: 0.81 mg/dL (ref 0.44–1.00)
GFR, Est AFR Am: >60 mL/min (ref >60)
GFR, Estimated: >60 mL/min (ref >60)
Glucose, Bld: 166 mg/dL — ABNORMAL HIGH (ref 70–99)
Potassium: 3.6 mmol/L (ref 3.5–5.1)
Sodium: 143 mmol/L (ref 135–145)
Total Bilirubin: 0.3 mg/dL (ref 0.3–1.2)
Total Protein: 7 g/dL (ref 6.5–8.1)

## 2019-03-24 LAB — CBC WITH DIFFERENTIAL/PLATELET
Abs Immature Granulocytes: 0.01 10*3/uL (ref 0.00–0.07)
Basophils Absolute: 0 10*3/uL (ref 0.0–0.1)
Basophils Relative: 0 %
Eosinophils Absolute: 0.1 10*3/uL (ref 0.0–0.5)
Eosinophils Relative: 2 %
HCT: 38.7 % (ref 36.0–46.0)
Hemoglobin: 12.9 g/dL (ref 12.0–15.0)
Immature Granulocytes: 0 %
Lymphocytes Relative: 33 %
Lymphs Abs: 1.9 10*3/uL (ref 0.7–4.0)
MCH: 28.7 pg (ref 26.0–34.0)
MCHC: 33.3 g/dL (ref 30.0–36.0)
MCV: 86 fL (ref 80.0–100.0)
Monocytes Absolute: 0.5 10*3/uL (ref 0.1–1.0)
Monocytes Relative: 9 %
Neutro Abs: 3.1 10*3/uL (ref 1.7–7.7)
Neutrophils Relative %: 56 %
Platelets: 401 10*3/uL — ABNORMAL HIGH (ref 150–400)
RBC: 4.5 MIL/uL (ref 3.87–5.11)
RDW: 12.7 % (ref 11.5–15.5)
WBC: 5.6 10*3/uL (ref 4.0–10.5)
nRBC: 0 % (ref 0.0–0.2)

## 2019-03-24 LAB — LACTATE DEHYDROGENASE: LDH: 150 U/L (ref 98–192)

## 2019-03-24 MED ORDER — ACETAMINOPHEN 325 MG PO TABS
ORAL_TABLET | ORAL | Status: AC
  Filled 2019-03-24: qty 2

## 2019-03-24 MED ORDER — SODIUM CHLORIDE 0.9 % IV SOLN
Freq: Once | INTRAVENOUS | Status: AC
  Filled 2019-03-24: qty 250

## 2019-03-24 MED ORDER — ACETAMINOPHEN 325 MG PO TABS
650.0000 mg | ORAL_TABLET | Freq: Once | ORAL | Status: AC
  Administered 2019-03-24: 650 mg via ORAL

## 2019-03-24 MED ORDER — SODIUM CHLORIDE 0.9 % IV SOLN
375.0000 mg/m2 | Freq: Once | INTRAVENOUS | Status: AC
  Administered 2019-03-24: 700 mg via INTRAVENOUS
  Filled 2019-03-24: qty 70

## 2019-03-24 MED ORDER — FAMOTIDINE IN NACL 20-0.9 MG/50ML-% IV SOLN
20.0000 mg | Freq: Once | INTRAVENOUS | Status: AC
  Administered 2019-03-24: 20 mg via INTRAVENOUS

## 2019-03-24 MED ORDER — DEXAMETHASONE SODIUM PHOSPHATE 10 MG/ML IJ SOLN
10.0000 mg | Freq: Once | INTRAMUSCULAR | Status: AC
  Administered 2019-03-24: 10 mg via INTRAVENOUS

## 2019-03-24 MED ORDER — DIPHENHYDRAMINE HCL 25 MG PO CAPS
ORAL_CAPSULE | ORAL | Status: AC
  Filled 2019-03-24: qty 2

## 2019-03-24 MED ORDER — DEXAMETHASONE SODIUM PHOSPHATE 10 MG/ML IJ SOLN
INTRAMUSCULAR | Status: AC
  Filled 2019-03-24: qty 1

## 2019-03-24 MED ORDER — FAMOTIDINE IN NACL 20-0.9 MG/50ML-% IV SOLN
INTRAVENOUS | Status: AC
  Filled 2019-03-24: qty 50

## 2019-03-24 MED ORDER — DIPHENHYDRAMINE HCL 25 MG PO CAPS
50.0000 mg | ORAL_CAPSULE | Freq: Once | ORAL | Status: AC
  Administered 2019-03-24: 50 mg via ORAL

## 2019-03-24 NOTE — Patient Instructions (Signed)
[facility] Discharge Instructions for Patients Receiving Chemotherapy  Today you received the following chemotherapy agents Rituximab--pvvr  To help prevent nausea and vomiting after your treatment, we encourage you to take your nausea medication as directed   If you develop nausea and vomiting that is not controlled by your nausea medication, call the clinic.   BELOW ARE SYMPTOMS THAT SHOULD BE REPORTED IMMEDIATELY:  *FEVER GREATER THAN 100.5 F  *CHILLS WITH OR WITHOUT FEVER  NAUSEA AND VOMITING THAT IS NOT CONTROLLED WITH YOUR NAUSEA MEDICATION  *UNUSUAL SHORTNESS OF BREATH  *UNUSUAL BRUISING OR BLEEDING  TENDERNESS IN MOUTH AND THROAT WITH OR WITHOUT PRESENCE OF ULCERS  *URINARY PROBLEMS  *BOWEL PROBLEMS  UNUSUAL RASH Items with * indicate a potential emergency and should be followed up as soon as possible.  Feel free to call the clinic should you have any questions or concerns. The clinic phone number is [phone].  Please show the CHEMO ALERT CARD at check-in to the Emergency Department and triage nurse.   

## 2019-03-25 ENCOUNTER — Telehealth: Payer: Self-pay | Admitting: Hematology

## 2019-03-25 NOTE — Telephone Encounter (Signed)
Scheduled appt per 12/22 los.  Spoke with pt and she is aware of her appt date and time.

## 2019-04-06 ENCOUNTER — Other Ambulatory Visit: Payer: Self-pay

## 2019-04-06 ENCOUNTER — Emergency Department (HOSPITAL_COMMUNITY)
Admission: EM | Admit: 2019-04-06 | Discharge: 2019-04-06 | Disposition: A | Discharge status: Home / Self Care | Payer: BC Managed Care – PPO | Attending: Emergency Medicine | Admitting: Emergency Medicine

## 2019-04-06 ENCOUNTER — Encounter (HOSPITAL_COMMUNITY): Payer: Self-pay | Admitting: Emergency Medicine

## 2019-04-06 DIAGNOSIS — J029 Acute pharyngitis, unspecified: Secondary | ICD-10-CM | POA: Diagnosis not present

## 2019-04-06 DIAGNOSIS — Z5321 Procedure and treatment not carried out due to patient leaving prior to being seen by health care provider: Secondary | ICD-10-CM | POA: Insufficient documentation

## 2019-04-06 NOTE — ED Triage Notes (Signed)
Pt reports sore throat since last night. Pt afebrile. nad noted.

## 2019-04-08 ENCOUNTER — Ambulatory Visit: Payer: BC Managed Care – PPO | Attending: Internal Medicine

## 2019-04-08 ENCOUNTER — Other Ambulatory Visit: Payer: Self-pay

## 2019-04-08 ENCOUNTER — Inpatient Hospital Stay: Payer: BC Managed Care – PPO | Admitting: Hematology

## 2019-04-08 ENCOUNTER — Telehealth: Payer: Self-pay | Admitting: *Deleted

## 2019-04-08 DIAGNOSIS — Z20822 Contact with and (suspected) exposure to covid-19: Secondary | ICD-10-CM | POA: Diagnosis not present

## 2019-04-08 NOTE — Telephone Encounter (Signed)
Dr. Irene Limbo asked that phone appt top review MRI results be r/s after MRI completed. Appt for 04/09/19 cancelled. Contacted patient with this information. She wants to have MRI at [facility] imaging due to claustrophobia. Gave her number for [facility]. She will let office know once MRI scheduled so that appt with Dr. Irene Limbo can be scheduled

## 2019-04-09 LAB — NOVEL CORONAVIRUS, NAA: SARS-CoV-2, NAA: NOT DETECTED

## 2019-04-10 ENCOUNTER — Other Ambulatory Visit: Payer: Self-pay | Admitting: Hematology

## 2019-04-10 ENCOUNTER — Telehealth: Payer: Self-pay

## 2019-04-10 DIAGNOSIS — R519 Headache, unspecified: Secondary | ICD-10-CM

## 2019-04-10 DIAGNOSIS — M542 Cervicalgia: Secondary | ICD-10-CM

## 2019-04-10 DIAGNOSIS — C884 Extranodal marginal zone B-cell lymphoma of mucosa-associated lymphoid tissue [MALT-lymphoma]: Secondary | ICD-10-CM

## 2019-04-10 NOTE — Telephone Encounter (Signed)
Patient called stating that she can not get into her My Chart. She states it says her address is wrong.  Address verified as correct in chart.  She will call the My Chart line for help.

## 2019-04-20 ENCOUNTER — Other Ambulatory Visit: Payer: Self-pay | Admitting: Obstetrics and Gynecology

## 2019-05-06 ENCOUNTER — Ambulatory Visit
Admission: RE | Admit: 2019-05-06 | Discharge: 2019-05-06 | Disposition: A | Discharge status: Home / Self Care | Payer: BC Managed Care – PPO | Source: Ambulatory Visit | Attending: Hematology | Admitting: Hematology

## 2019-05-06 DIAGNOSIS — M542 Cervicalgia: Secondary | ICD-10-CM

## 2019-05-06 DIAGNOSIS — R519 Headache, unspecified: Secondary | ICD-10-CM

## 2019-05-06 DIAGNOSIS — C884 Extranodal marginal zone B-cell lymphoma of mucosa-associated lymphoid tissue [MALT-lymphoma]: Secondary | ICD-10-CM

## 2019-05-06 IMAGING — MR MR CERVICAL SPINE WO/W CM
6 of 8 series · 29 of 48 positions shown · IV contrast (16ml Multihance)
Comparison: MRI head [DATE].  MRI cervical spine [DATE]

CLINICAL DATA: History of lymphoma.  Intractable headache.

Creatinine was obtained on site at [HOSPITAL] at [HOSPITAL].
Results: Creatinine 0.7 mg/dL.
EXAM:
MRI CERVICAL SPINE WITHOUT AND WITH CONTRAST
TECHNIQUE: Multiplanar and multiecho pulse sequences of the cervical spine, to
include the craniocervical junction and cervicothoracic junction,
were obtained without and with intravenous contrast.
CONTRAST:  16mL MULTIHANCE GADOBENATE DIMEGLUMINE 529 MG/ML IV SOLN

[Series 5: T1 · sagittal · 3.0mm · 0.41mm/px · 4 of 16 slices shown (1 of 2)]
[im 1/16]
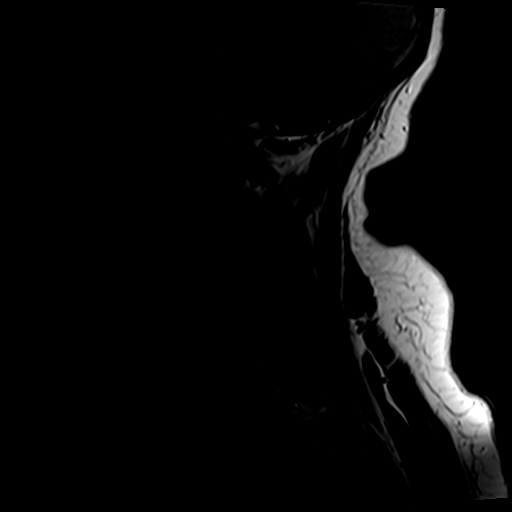
[im 6/16]
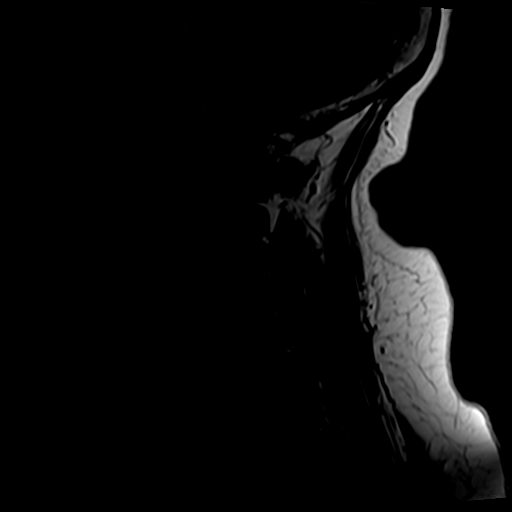
[im 11/16]
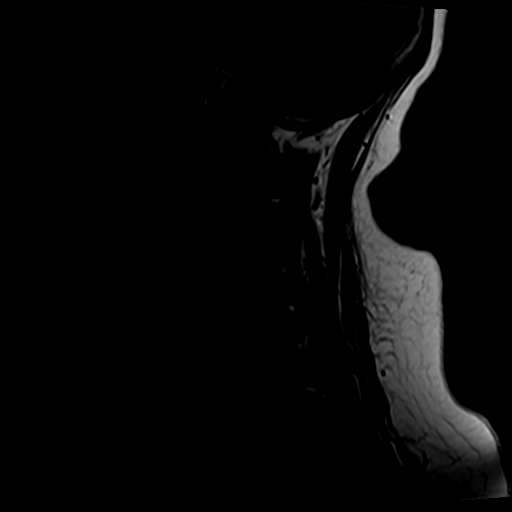
[im 16/16]
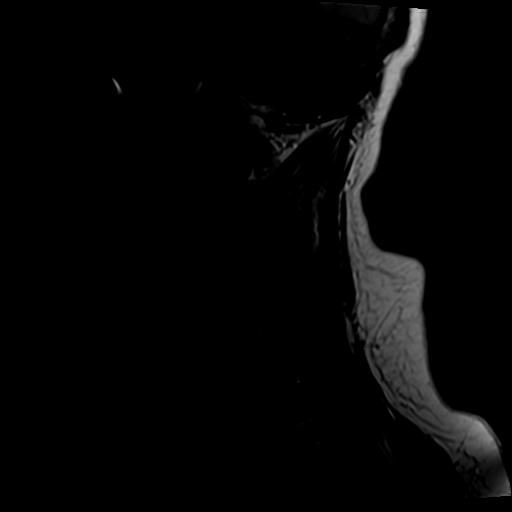

[Series 8: T2 · axial · 3.0mm · 0.70mm/px · z∈[-64,+36]mm · 8 of 28 slices shown (1 of 2)]
[im 1/28]
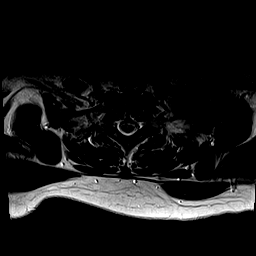
[im 4/28]
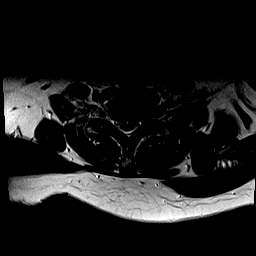
[im 8/28]
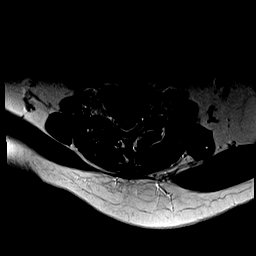
[im 12/28]
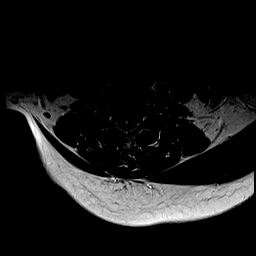
[im 16/28]
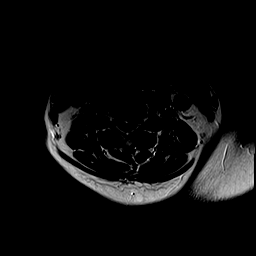
[im 20/28]
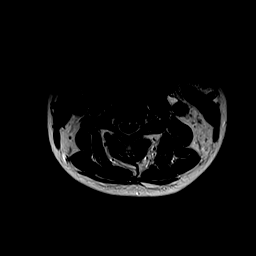
[im 24/28]
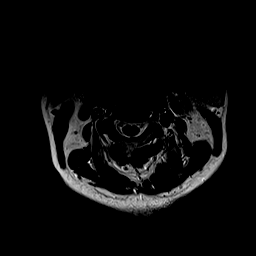
[im 28/28]
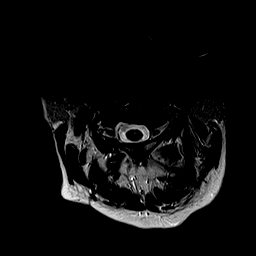

[Series 9: T1 · axial · 3.0mm · 0.35mm/px · z∈[-64,+36]mm · 8 of 28 slices shown (2 of 2)]
[im 1/28]
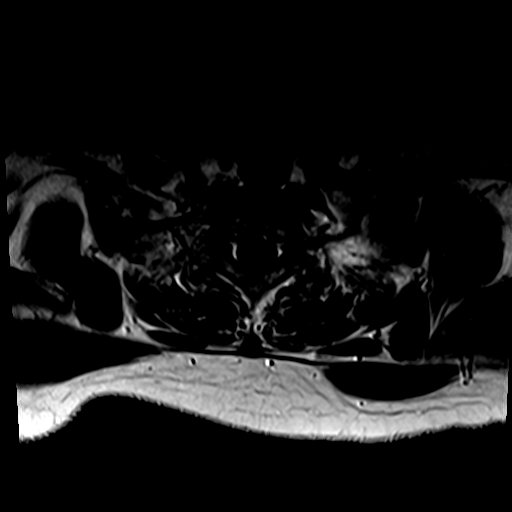
[im 4/28]
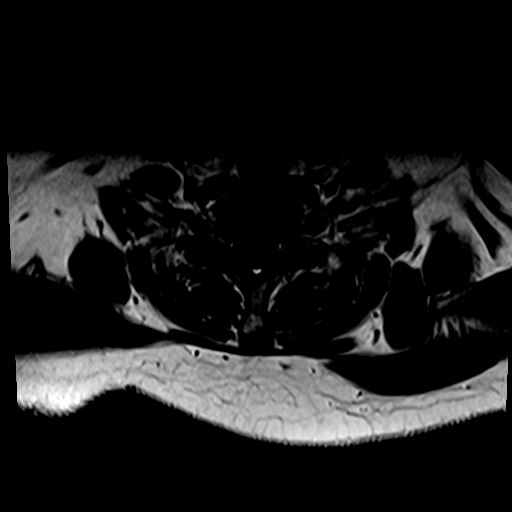
[im 8/28]
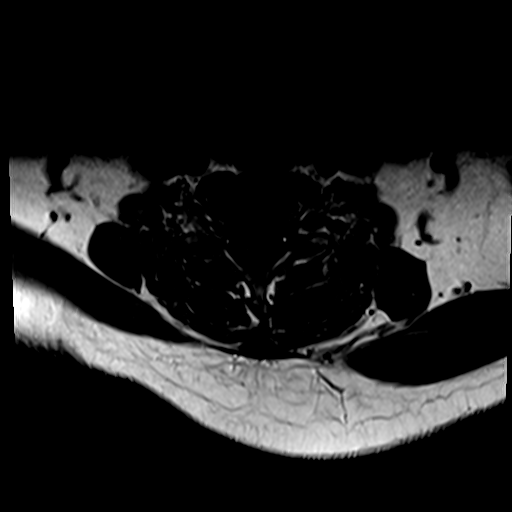
[im 12/28]
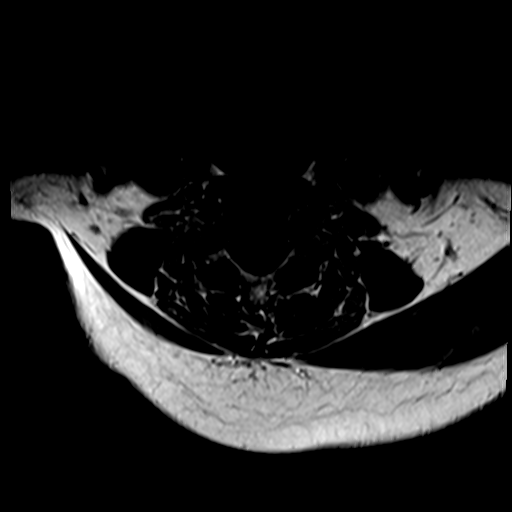
[im 16/28]
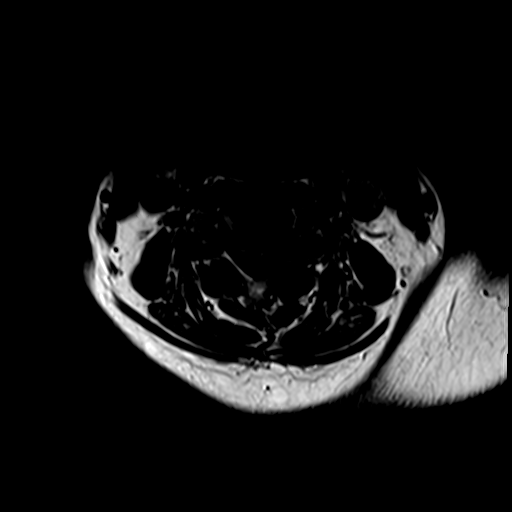
[im 20/28]
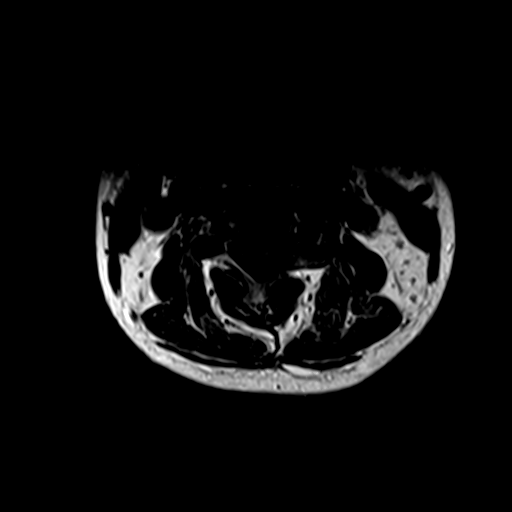
[im 24/28]
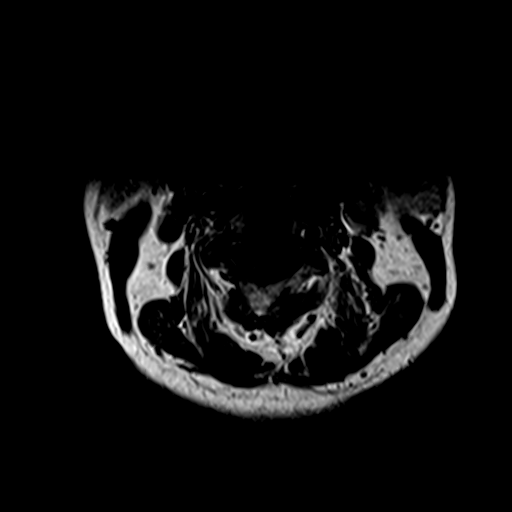
[im 28/28]
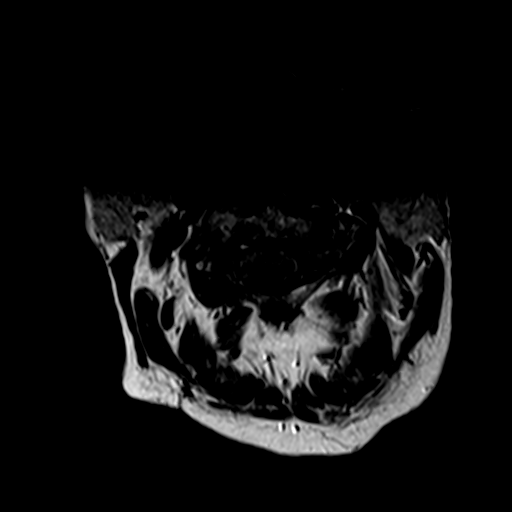

[Series 10: T2 · sagittal · 3.0mm · 0.66mm/px · 4 of 16 slices shown (2 of 2)]
[im 1/16]
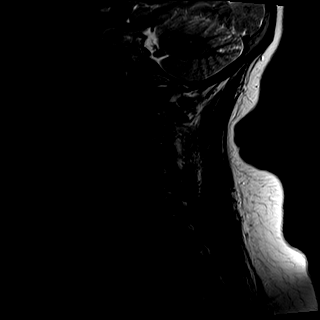
[im 6/16]
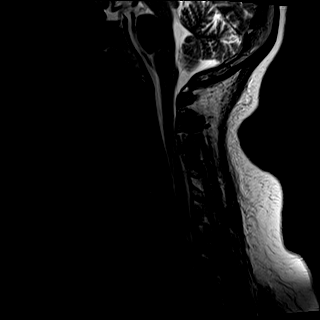
[im 11/16]
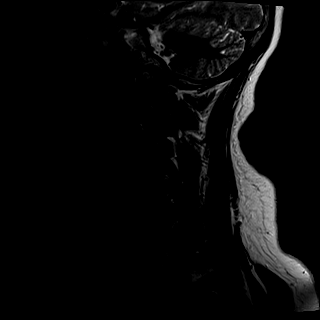
[im 16/16]
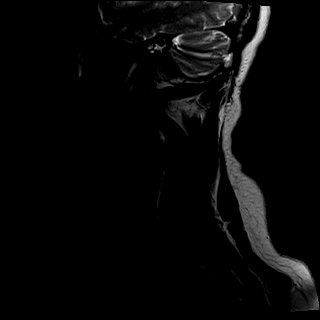

[Series 11: T1 fat-sat post-contrast · sagittal · 3.0mm · 0.82mm/px · 4 of 16 slices shown]
[im 1/16]
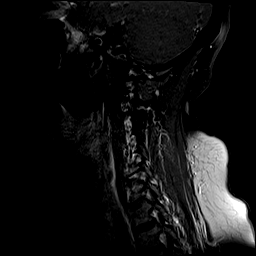
[im 6/16]
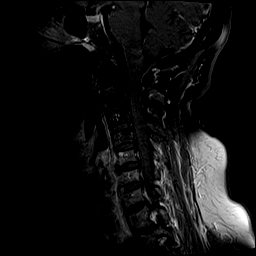
[im 11/16]
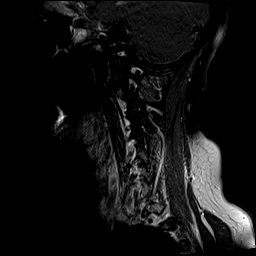
[im 16/16]
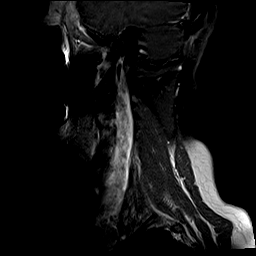

[Series 12: T1 post-contrast · axial · 3.0mm · 0.35mm/px · 1 of 28 slices shown]
[im 1/28]
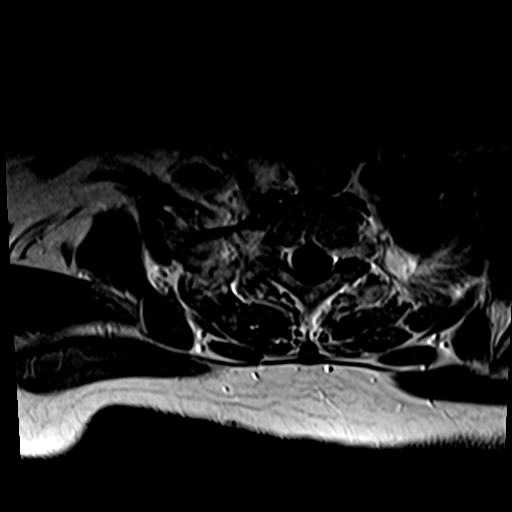

[29 of 48 positions shown; findings below may reference images not displayed]

FINDINGS: Alignment: Normal alignment with straightening of the cervical
lordosis

Vertebrae: Normal bone marrow. No fracture or mass. No enhancing
skeletal lesion.

Cord: Normal cord signal.  No cord lesion or cord compression.

Posterior Fossa, vertebral arteries, paraspinal tissues: MRI brain
[DATE] demonstrated marked dural thickening and enhancement
posterior and inferior to the cerebellum on the right extending into
the spinal canal. This has resolved. There has been prior biopsy of
the dura in the posterior fossa on the right. No residual posterior
fossa abnormal enhancement.

Disc levels:

C2-3: Negative

C3-4: Negative

C4-5: Mild disc degeneration. Mild uncinate spurring without spinal
or foraminal stenosis

C5-6: Small central disc protrusion. No significant spinal or
foraminal stenosis

C6-7: Small central and right-sided disc protrusion. Mild uncinate
spurring bilaterally. Mild foraminal narrowing bilaterally. No
change from the prior study.

C7-T1: Mild disc degeneration. Negative for spinal or foraminal
stenosis.
IMPRESSION: No evidence of lymphoma in the cervical spine. Previously noted
dural enhancement in the posterior fossa and cervical canal has
resolved. No cord compression or cord lesion

Chronic cervical spine degenerative changes are stable from the
prior study.

## 2019-05-06 MED ORDER — GADOBENATE DIMEGLUMINE 529 MG/ML IV SOLN
16.0000 mL | Freq: Once | INTRAVENOUS | Status: AC | PRN
  Administered 2019-05-06: 16 mL via INTRAVENOUS

## 2019-05-20 ENCOUNTER — Other Ambulatory Visit: Payer: Self-pay

## 2019-05-20 ENCOUNTER — Inpatient Hospital Stay (HOSPITAL_BASED_OUTPATIENT_CLINIC_OR_DEPARTMENT_OTHER): Payer: Medicaid Other | Admitting: Hematology

## 2019-05-20 ENCOUNTER — Inpatient Hospital Stay: Payer: Medicaid Other

## 2019-05-20 ENCOUNTER — Inpatient Hospital Stay: Payer: Medicaid Other | Attending: Internal Medicine

## 2019-05-20 VITALS — BP 125/75 | HR 79 | Temp 98.5°F | Resp 16

## 2019-05-20 VITALS — BP 141/81 | HR 76 | Temp 97.8°F | Resp 18 | Ht 67.5 in | Wt 176.2 lb

## 2019-05-20 DIAGNOSIS — Z7189 Other specified counseling: Secondary | ICD-10-CM

## 2019-05-20 DIAGNOSIS — E7800 Pure hypercholesterolemia, unspecified: Secondary | ICD-10-CM | POA: Diagnosis not present

## 2019-05-20 DIAGNOSIS — M1990 Unspecified osteoarthritis, unspecified site: Secondary | ICD-10-CM | POA: Insufficient documentation

## 2019-05-20 DIAGNOSIS — I10 Essential (primary) hypertension: Secondary | ICD-10-CM | POA: Insufficient documentation

## 2019-05-20 DIAGNOSIS — Z5112 Encounter for antineoplastic immunotherapy: Secondary | ICD-10-CM | POA: Insufficient documentation

## 2019-05-20 DIAGNOSIS — C884 Extranodal marginal zone B-cell lymphoma of mucosa-associated lymphoid tissue [MALT-lymphoma]: Secondary | ICD-10-CM | POA: Insufficient documentation

## 2019-05-20 DIAGNOSIS — Z79899 Other long term (current) drug therapy: Secondary | ICD-10-CM | POA: Insufficient documentation

## 2019-05-20 DIAGNOSIS — R519 Headache, unspecified: Secondary | ICD-10-CM | POA: Diagnosis not present

## 2019-05-20 DIAGNOSIS — D573 Sickle-cell trait: Secondary | ICD-10-CM | POA: Insufficient documentation

## 2019-05-20 DIAGNOSIS — F17210 Nicotine dependence, cigarettes, uncomplicated: Secondary | ICD-10-CM | POA: Insufficient documentation

## 2019-05-20 LAB — CBC WITH DIFFERENTIAL/PLATELET
Abs Immature Granulocytes: 0.01 10*3/uL (ref 0.00–0.07)
Basophils Absolute: 0 10*3/uL (ref 0.0–0.1)
Basophils Relative: 0 %
Eosinophils Absolute: 0.1 10*3/uL (ref 0.0–0.5)
Eosinophils Relative: 2 %
HCT: 37 % (ref 36.0–46.0)
Hemoglobin: 12.5 g/dL (ref 12.0–15.0)
Immature Granulocytes: 0 %
Lymphocytes Relative: 27 %
Lymphs Abs: 1.5 10*3/uL (ref 0.7–4.0)
MCH: 28.8 pg (ref 26.0–34.0)
MCHC: 33.8 g/dL (ref 30.0–36.0)
MCV: 85.3 fL (ref 80.0–100.0)
Monocytes Absolute: 0.4 10*3/uL (ref 0.1–1.0)
Monocytes Relative: 8 %
Neutro Abs: 3.5 10*3/uL (ref 1.7–7.7)
Neutrophils Relative %: 63 %
Platelets: 396 10*3/uL (ref 150–400)
RBC: 4.34 MIL/uL (ref 3.87–5.11)
RDW: 12.5 % (ref 11.5–15.5)
WBC: 5.5 10*3/uL (ref 4.0–10.5)
nRBC: 0 % (ref 0.0–0.2)

## 2019-05-20 LAB — CMP (CANCER CENTER ONLY)
ALT: 16 U/L (ref 0–44)
AST: 17 U/L (ref 15–41)
Albumin: 3.9 g/dL (ref 3.5–5.0)
Alkaline Phosphatase: 73 U/L (ref 38–126)
Anion gap: 9 (ref 5–15)
BUN: 10 mg/dL (ref 6–20)
CO2: 29 mmol/L (ref 22–32)
Calcium: 9.9 mg/dL (ref 8.9–10.3)
Chloride: 104 mmol/L (ref 98–111)
Creatinine: 0.75 mg/dL (ref 0.44–1.00)
GFR, Est AFR Am: >60 mL/min (ref >60)
GFR, Estimated: >60 mL/min (ref >60)
Glucose, Bld: 97 mg/dL (ref 70–99)
Potassium: 3.4 mmol/L — ABNORMAL LOW (ref 3.5–5.1)
Sodium: 142 mmol/L (ref 135–145)
Total Bilirubin: 0.3 mg/dL (ref 0.3–1.2)
Total Protein: 7.1 g/dL (ref 6.5–8.1)

## 2019-05-20 MED ORDER — ACETAMINOPHEN 325 MG PO TABS
650.0000 mg | ORAL_TABLET | Freq: Once | ORAL | Status: AC
  Administered 2019-05-20: 13:00:00 650 mg via ORAL

## 2019-05-20 MED ORDER — SODIUM CHLORIDE 0.9 % IV SOLN
Freq: Once | INTRAVENOUS | Status: AC
  Filled 2019-05-20: qty 250

## 2019-05-20 MED ORDER — DIPHENHYDRAMINE HCL 25 MG PO CAPS
ORAL_CAPSULE | ORAL | Status: AC
  Filled 2019-05-20: qty 2

## 2019-05-20 MED ORDER — ACETAMINOPHEN 325 MG PO TABS
ORAL_TABLET | ORAL | Status: AC
  Filled 2019-05-20: qty 2

## 2019-05-20 MED ORDER — FAMOTIDINE IN NACL 20-0.9 MG/50ML-% IV SOLN
20.0000 mg | Freq: Once | INTRAVENOUS | Status: AC
  Administered 2019-05-20: 13:00:00 20 mg via INTRAVENOUS

## 2019-05-20 MED ORDER — DEXAMETHASONE SODIUM PHOSPHATE 10 MG/ML IJ SOLN
10.0000 mg | Freq: Once | INTRAMUSCULAR | Status: AC
  Administered 2019-05-20: 13:00:00 10 mg via INTRAVENOUS

## 2019-05-20 MED ORDER — DEXAMETHASONE SODIUM PHOSPHATE 10 MG/ML IJ SOLN
INTRAMUSCULAR | Status: AC
  Filled 2019-05-20: qty 1

## 2019-05-20 MED ORDER — FAMOTIDINE IN NACL 20-0.9 MG/50ML-% IV SOLN
INTRAVENOUS | Status: AC
  Filled 2019-05-20: qty 50

## 2019-05-20 MED ORDER — DIPHENHYDRAMINE HCL 25 MG PO CAPS
50.0000 mg | ORAL_CAPSULE | Freq: Once | ORAL | Status: AC
  Administered 2019-05-20: 13:00:00 50 mg via ORAL

## 2019-05-20 MED ORDER — SODIUM CHLORIDE 0.9 % IV SOLN
375.0000 mg/m2 | Freq: Once | INTRAVENOUS | Status: AC
  Administered 2019-05-20: 700 mg via INTRAVENOUS
  Filled 2019-05-20: qty 20

## 2019-05-20 NOTE — Progress Notes (Signed)
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 female who has been referred to [place] by my colleague Dr. Cecil Cobbs in Neuro-Oncology for evaluation and management of her Newly diagnosed Extranodal Marginal Zone Lymphoma. She is accompanied today by her sister [name] via [employer]. The pt reports that she is doing well overall.  Prior to today's visit, the pt presented to the ED on 07/21/18 with complaints of a headache for the past month which presented intermittently and was worsened by bending, coughing, or laughing. She had a CT Head and MRI Brain which revealed extensive dural thickening, as noted below. She then had a right posterior fossa craniectomy on 07/28/18 for open exicisional biopsies of the right cerebellar hemisphere and dura which revealed Extranodal marginal zone lymphoma. The pt then established care with my colleague Dr. Cecil Cobbs in Neuro-Onc on 08/11/18. She has been taking 4mg  Decadron daily.  The pt reports that developed worsened headaches about a month ago, and notes that when she laughed it felt like "electiricty" was running through the sides and back of her head. She notes that some mild headaches had begun a couple months ago. Denies double vision, changes in vision, loss of balance, or other senses being affected. The pt notes that in addition to this, she developed redness in the eyes in the past couple months, saw eye doctor and was prescribed eye drops. Denies other concerns in the last 6 months including bone pains, fatigue, fevers, chills, night sweats or unexpected weight loss. She notes that her headaches have resolved. She notes she began Decadron after her biopsy. She denies having "any symptoms  whatsoever," currently.   Of note prior to the patient's visit today, pt has had an MRI Brain completed on 07/21/18 with results revealing "Extensive dural thickening surrounding the right cerebellar hemisphere with mass-effect and edema in the right cerebellum. The process appears to extend into the upper cervical canal. Mild obstructive hydrocephalus. Differential includes tumor including metastatic disease and lymphoma. Chronic inflammatory process such as sarcoid is a consideration however chest x-ray negative. TB and atypical infection/fungus also possible. 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  On review of systems, pt reports good energy levels, headache resolution, and denies joint issues, skin rashes, dry mouth, dry eyes, fevers, chills, night sweats, bone pains, unexpected weight loss, fatigue, double vision, loss of vision, affected senses, loss of balance, and any other symptoms.   On PMHx the pt reports sickle cel trait, HTN, hyperlipidemia. On Social Hx the pt reports that she smoked 1 ppd for the last 42 years, and reports having quit two days ago. 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 On Family Hx the pt reports dad with lupus and sister with sarcoidosis. Daughter and son with sickle cell disease. Denies other blood disorders or cancers. Endorses Penicillin allergy  Interval History:   Amber Drake returns today for management and evaluation of her CNS Extranodal Marginal Zone Lymphoma.  She is here for her maintenance Rituxan. The patient's last visit with [place] was on 03/24/2019. The pt reports that she is doing well overall.  The pt reports that she has been stressed due to her son's health. He is currently in  the hospital. She quit her job last week as she was concerned that it was taking too great of a toll on her body. Her back, in the left flank area, has been hurting and her left calf has been cramping quite a bit. She has no history of kidney stones.   Of note since the patient's last visit, pt has had MRI Cervical Spine ([id number]) completed on 05/06/2019 with results revealing "No evidence of lymphoma in the cervical spine. Previously noted dural enhancement in the posterior fossa and cervical canal has resolved. No cord compression or cord lesion. Chronic cervical spine degenerative changes are stable from the prior study."  Lab results today (05/20/19) of CBC w/diff and CMP is as follows: all values are WNL except for Potassium at 3.4.  On review of systems, pt reports stress, back pain, left calf cramps, healthy appeitite and denies headaches, vision changes and any other symptoms.   MEDICAL HISTORY:  Past Medical History:  Diagnosis Date  . Anemia   . Arthritis   . Blood dyscrasia    sickle cell trait  . Carbuncle and furuncle   . Hypercholesterolemia   . Hypertension    does not take meds  . Sickle cell trait ([place])     SURGICAL HISTORY: Past Surgical History:  Procedure Laterality Date  . APPENDECTOMY    . APPLICATION OF CRANIAL NAVIGATION N/A 07/28/2018   Procedure: APPLICATION OF CRANIAL NAVIGATION;  Surgeon: Kary Kos, MD;  Location: [facility];  Service: Neurosurgery;  Laterality: N/A;  . BRAIN SURGERY    . CARPAL TUNNEL RELEASE  03/23/2011   Procedure: CARPAL TUNNEL RELEASE;  Surgeon: Sanjuana Kava;  Location: [facility];  Service: Orthopedics;  Laterality: Left;  . CARPAL TUNNEL RELEASE  05/10/2011   Procedure: CARPAL TUNNEL RELEASE;  Surgeon: Sanjuana Kava, MD;  Location: [facility];  Service: Orthopedics;  Laterality: Right;  . CESAREAN SECTION     x 2  . INCISION AND DRAINAGE PERIRECTAL ABSCESS  12/21/09  . PR DURAL GRAFT REPAIR,SPINE DEFECT N/A 07/28/2018   Procedure:  Stereotactic open biopsy of Right cerebellar hemisphere and dura with brainlab;  Surgeon: Kary Kos, MD;  Location: [facility];  Service: Neurosurgery;  Laterality: N/A;  Stereotactic open biopsy of Right cerebellar hemisphere and dura with brainlab  . PROCTOSCOPY  10/17/2010   Procedure: PROCTOSCOPY;  Surgeon: Scherry Ran;  Location: [facility];  Service: General;  Laterality: N/A;  Rigid Proctoscopy/Possible Fistula in Ano  Procedure ended at 1003  . THERAPEUTIC ABORTION     x2    SOCIAL HISTORY: Social History   Socioeconomic History  . Marital status: Legally Separated    Spouse name: Not on file  . Number of children: Not on file  . Years of education: Not on file  . Highest education level: Not on file  Occupational History  . Not on file  Tobacco Use  . Smoking status: Current Every Day Smoker    Packs/day: 0.50    Years: 30.00    Pack years: 15.00    Types: Cigarettes  . Smokeless tobacco: Never Used  Substance and Sexual Activity  . Alcohol use: No  . 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  Sexual activity: Yes    Partners: Male    Birth control/protection: None  Other Topics Concern  . Not on file  Social History Narrative  . Not on file   Social Determinants of Health   Financial Resource Strain:   . Difficulty of Paying Living Expenses: Not on file  Food Insecurity:   . Worried About Charity fundraiser in the Last Year: Not on file  . Ran Out of Food in the Last Year: Not on file  Transportation Needs:   . Lack of Transportation (Medical): Not on file  . Lack of Transportation (Non-Medical): Not on file  Physical Activity:   . Days of Exercise per Week: Not on file  . Minutes of Exercise per Session: Not on file  Stress:   . Feeling of Stress : Not on file  Social Connections:   . Frequency of Communication with Friends and Family: Not on file  . Frequency of Social Gatherings with Friends and Family: Not on file  . Attends Religious  Services: Not on file  . Active Member of Clubs or Organizations: Not on file  . Attends Archivist Meetings: Not on file  . Marital Status: Not on file  Intimate Partner Violence:   . Fear of Current or Ex-Partner: Not on file  . Emotionally Abused: Not on file  . Physically Abused: Not on file  . Sexually Abused: Not on file    FAMILY HISTORY: Family History  Problem Relation Age of Onset  . Kidney failure Daughter   . Sickle cell anemia Daughter   . Sickle cell trait Daughter   . Sickle cell anemia Son   . Hypertension Mother   . Hyperlipidemia Mother   . Hypertension Father   . Lupus Father        skin  . Hypertension Other   . Sarcoidosis Sister   . Anesthesia problems Neg Hx   . Hypotension Neg Hx   . Malignant hyperthermia Neg Hx   . Pseudochol deficiency Neg Hx     ALLERGIES:  is allergic to penicillins; penicillins cross reactors; and tape.  MEDICATIONS:  Current Outpatient Medications  Medication Sig Dispense Refill  . albuterol (PROAIR HFA) 108 (90 Base) MCG/ACT inhaler Inhale 2 puffs into the lungs 4 (four) times daily as needed.    Marland Kitchen atorvastatin (LIPITOR) 20 MG tablet Take 1 tablet (20 mg total) by mouth daily. 90 tablet 2  . dexamethasone (DECADRON) 1 MG tablet 1mg  po daily with breakfast till 8/24 then 1mg  po every other day for 10 days then stop 30 tablet 0  . ferrous sulfate 325 (65 FE) MG tablet Take 325 mg by mouth daily with breakfast.    . lisinopril-hydrochlorothiazide (PRINZIDE,ZESTORETIC) 20-12.5 MG tablet Take 1 tablet by mouth daily.    . Multiple Vitamin (MULTIVITAMIN WITH MINERALS) TABS tablet Take 1 tablet by mouth daily.    . potassium chloride SA (KLOR-CON) 20 MEQ tablet Take 1 tablet (20 mEq total) by mouth 2 (two) times daily. 60 tablet 1  . ranitidine (ZANTAC) 150 MG capsule Take 150 mg by mouth 2 (two) times daily.    . cyclobenzaprine (FLEXERIL) 10 MG tablet Take 1 tablet (10 mg total) by mouth 3 (three) times daily as needed  for muscle spasms. (Patient not taking: Reported on 05/20/2019) 40 tablet 0  . doxycycline (VIBRAMYCIN) 100 MG capsule TAKE (1) CAPSULE BY MOUTH TWICE DAILY FOR 14 DAYS. 28 capsule 0  . HYDROcodone-acetaminophen (NORCO/VICODIN) 5-325 MG tablet  Take 1 tablet by mouth every 4 (four) hours as needed (for severe headaches.). 30 tablet 0  . traMADol (ULTRAM) 50 MG tablet Take 1 tablet (50 mg total) by mouth every 6 (six) hours as needed for moderate pain or severe pain. 30 tablet 0   No current facility-administered medications for this visit.    REVIEW OF SYSTEMS:   A 10+ POINT REVIEW OF SYSTEMS WAS OBTAINED including neurology, dermatology, psychiatry, cardiac, respiratory, lymph, extremities, GI, GU, Musculoskeletal, constitutional, breasts, reproductive, HEENT.  All pertinent positives are noted in the HPI.  All others are negative.   PHYSICAL EXAMINATION: ECOG PERFORMANCE STATUS: 1 - Symptomatic but completely ambulatory  . Vitals:   05/20/19 1123  BP: (!) 141/81  Pulse: 76  Resp: 18  Temp: 97.8 F (36.6 C)  SpO2: 100%   Filed Weights   05/20/19 1123  Weight: 176 lb 3.2 oz (79.9 kg)   .Body mass index is 27.19 kg/m.   GENERAL:alert, in no acute distress and comfortable SKIN: no acute rashes, no significant lesions EYES: conjunctiva are pink and non-injected, sclera anicteric OROPHARYNX: MMM, no exudates, no oropharyngeal erythema or ulceration NECK: supple, no JVD LYMPH:  no palpable lymphadenopathy in the cervical, axillary or inguinal regions LUNGS: clear to auscultation b/l with normal respiratory effort HEART: regular rate & rhythm ABDOMEN:  normoactive bowel sounds , non tender, not distended. No palpable hepatosplenomegaly.  Extremity: no pedal edema PSYCH: alert & oriented x 3 with fluent speech NEURO: no focal motor/sensory deficits  LABORATORY DATA:  I have reviewed the data as listed  . CBC Latest Ref Rng & Units 05/20/2019 03/24/2019 01/21/2019  WBC 4.0 -  10.5 K/uL 5.5 5.6 6.8  Hemoglobin 12.0 - 15.0 g/dL 12.5 12.9 11.9(L)  Hematocrit 36.0 - 46.0 % 37.0 38.7 35.7(L)  Platelets 150 - 400 K/uL 396 401(H) 319    . CMP Latest Ref Rng & Units 05/20/2019 03/24/2019 01/21/2019  Glucose 70 - 99 mg/dL 97 166(H) 96  BUN 6 - 20 mg/dL 10 12 10   Creatinine 0.44 - 1.00 mg/dL 0.75 0.81 0.81  Sodium 135 - 145 mmol/L 142 143 142  Potassium 3.5 - 5.1 mmol/L 3.4(L) 3.6 3.1(L)  Chloride 98 - 111 mmol/L 104 107 102  CO2 22 - 32 mmol/L 29 22 26   Calcium 8.9 - 10.3 mg/dL 9.9 9.6 10.3  Total Protein 6.5 - 8.1 g/dL 7.1 7.0 7.2  Total Bilirubin 0.3 - 1.2 mg/dL 0.3 0.3 0.3  Alkaline Phos 38 - 126 U/L 73 74 57  AST 15 - 41 U/L 17 18 15   ALT 0 - 44 U/L 16 16 10     09/12/18 BM Biopsy:    07/28/18 Right cerebellar hemisphere and dura biopsy:     RADIOGRAPHIC STUDIES: I have personally reviewed the radiological images as listed and agreed with the findings in the report. MR CERVICAL SPINE W WO CONTRAST  Result Date: 05/06/2019 CLINICAL DATA:  History of lymphoma.  Intractable headache. Creatinine was obtained on site at [facility] at [address] Results: Creatinine 0.7 mg/dL. EXAM: MRI CERVICAL SPINE WITHOUT AND WITH CONTRAST TECHNIQUE: Multiplanar and multiecho pulse sequences of the cervical spine, to include the craniocervical junction and cervicothoracic junction, were obtained without and with intravenous contrast. CONTRAST:  49mL MULTIHANCE GADOBENATE DIMEGLUMINE 529 MG/ML IV SOLN COMPARISON:  MRI head 07/21/2018.  MRI cervical spine 10/20/2018 FINDINGS: Alignment: Normal alignment with straightening of the cervical lordosis Vertebrae: Normal bone marrow. No fracture or mass. No enhancing skeletal  lesion. Cord: Normal cord signal.  No cord lesion or cord compression. Posterior Fossa, vertebral arteries, paraspinal tissues: MRI brain 07/21/2018 demonstrated marked dural thickening and enhancement posterior and inferior to the cerebellum on the  right extending into the spinal canal. This has resolved. There has been prior biopsy of the dura in the posterior fossa on the right. No residual posterior fossa abnormal enhancement. Disc levels: C2-3: Negative C3-4: Negative C4-5: Mild disc degeneration. Mild uncinate spurring without spinal or foraminal stenosis C5-6: Small central disc protrusion. No significant spinal or foraminal stenosis C6-7: Small central and right-sided disc protrusion. Mild uncinate spurring bilaterally. Mild foraminal narrowing bilaterally. No change from the prior study. C7-T1: Mild disc degeneration. Negative for spinal or foraminal stenosis. IMPRESSION: No evidence of lymphoma in the cervical spine. Previously noted dural enhancement in the posterior fossa and cervical canal has resolved. No cord compression or cord lesion Chronic cervical spine degenerative changes are stable from the prior study. Electronically Signed   By: Franchot Gallo M.D.   On: 05/06/2019 16:27    ASSESSMENT & PLAN:  59 y.o. female with  1. 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 The process appears to extend into the upper cervical canal. Mild obstructive hydrocephalus. Differential includes tumor including metastatic disease and lymphoma. Chronic inflammatory process such as sarcoid is a consideration however chest x-ray negative. TB and atypical infection/fungus also possible. 6 mm colloid cyst felt to be a separate problem."  07/25/18 CT C/A/P which did not reveal significant abnormality  09/12/18 BM Bx  revealed no evidence of lymphoma  09/08/18 PET/CT revealed "No hypermetabolic mass or adenopathy identified within the chest abdomen or pelvis to suggest metabolically active tumor. 2. Nonspecific focus of increased uptake within the cord extending from T12 to L1. Cannot rule out additional site of CNS lymphoma. Consider further evaluation with contrast enhanced MRI through this area. 3. Aortic Atherosclerosis and Emphysema."  10/16/2018 MRI brain w and w/o contrast revealed "1. Resolved dural mass in the right posterior fossa with minimal smooth dural thickening that may be treatment related. Cerebellar edema and mass effect is also resolved. No new site of disease. 2. 6 mm colloid cyst."  10/20/2018 MRI cervical spine w and w/o contrast revealed "Negative for lymphoma. No acute abnormality. Central disc protrusion at C5-6 effaces the ventral thecal sac causing mild central canal narrowing. There is also a shallow disc bulge at C6-7 which narrows but does not efface the ventral thecal Sac."  01/13/2019 MRI Brain ([id number]) revealed "1. Stable and satisfactory post treatment appearance of the posterior fossa. 2. Stable small 6 mm colloid cyst. 3. No new intracranial abnormality."  PLAN: -Discussed pt labwork today, 05/20/19; Potassium is borderline low, other blood chemistries and blood counts are normal -Discussed 05/06/2019 MRI Cervical Spine ([id number]) which revealed "No evidence of lymphoma in the cervical spine. Previously noted dural enhancement in the posterior fossa and cervical canal has resolved. No cord compression or cord lesion. Chronic cervical spine degenerative changes are stable from the prior study." -No lab, clinical, or radiographic evidence of CNS Extranodal Marginal Zone Lymphoma progression at this time -Pt has no prohibitive toxicities  from continuing maintenance Rituxan at this time -Will continue maintenance Rituxan every two months, up to two years  -Advised pt that  as long as she is on Lisinopril-HCTZ she will most likely need to continue potassium supplements.  -Advised pt that she can get more potassium in her diet via coconut water, tomato juice, orange juice or sweet potatoes -Recommend pt use warm compress for back pain -Continue Tramadol as needed for pain -Will rpt MRI Brain in 4 months, unless new symptomology -Recommend pt f/u with PCP for potassium supplement management -Will see back in 2 months with labs   FOLLOW UP: Please schedule next 3 doses of maintenance Rituxan with labs and MD visits [id number]   The total time spent in the appt was 30 minutes and more than 50% was on counseling and direct patient cares.  All of the patient's questions were answered with apparent satisfaction. The patient knows to call the clinic with any problems, questions or concerns.   Sullivan Lone MD [place] AAHIVMS [facility] Hematology/Oncology Physician [facility]  (Office):       [phone] (Work cell):  [phone] (Fax):           [phone]  05/20/2019 12:16 PM  I, Yevette Edwards, am acting as a scribe for Dr. Sullivan Lone.   .I have reviewed the above documentation for accuracy and completeness, and I agree with the above. Brunetta Genera MD

## 2019-05-20 NOTE — Patient Instructions (Signed)
[facility] Discharge Instructions for Patients Receiving Chemotherapy  Today you received the following chemotherapy agents: Rituximab   To help prevent nausea and vomiting after your treatment, we encourage you to take your nausea medication  as prescribed.    If you develop nausea and vomiting that is not controlled by your nausea medication, call the clinic.   BELOW ARE SYMPTOMS THAT SHOULD BE REPORTED IMMEDIATELY:  *FEVER GREATER THAN 100.5 F  *CHILLS WITH OR WITHOUT FEVER  NAUSEA AND VOMITING THAT IS NOT CONTROLLED WITH YOUR NAUSEA MEDICATION  *UNUSUAL SHORTNESS OF BREATH  *UNUSUAL BRUISING OR BLEEDING  TENDERNESS IN MOUTH AND THROAT WITH OR WITHOUT PRESENCE OF ULCERS  *URINARY PROBLEMS  *BOWEL PROBLEMS  UNUSUAL RASH Items with * indicate a potential emergency and should be followed up as soon as possible.  Feel free to call the clinic should you have any questions or concerns. The clinic phone number is [phone].  Please show the CHEMO ALERT CARD at check-in to the Emergency Department and triage nurse.   

## 2019-05-22 ENCOUNTER — Telehealth: Payer: Self-pay | Admitting: Hematology

## 2019-05-22 NOTE — Telephone Encounter (Signed)
Scheduled per 02/17 los, patient has been called and notified.

## 2019-07-16 NOTE — Progress Notes (Signed)
Pharmacist Chemotherapy Monitoring - Follow Up Assessment    I verify that I have reviewed each item in the below checklist:  . Regimen for the patient is scheduled for the appropriate day and plan matches scheduled date. Marland Kitchen Appropriate non-routine labs are ordered dependent on drug ordered. . If applicable, additional medications reviewed and ordered per protocol based on lifetime cumulative doses and/or treatment regimen.   Plan for follow-up and/or issues identified: No . I-vent associated with next due treatment: No . MD and/or nursing notified: No   Kennith Center, Pharm.D., CPP 07/16/2019@1 :02 PM

## 2019-07-21 NOTE — Progress Notes (Signed)
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 female who has been referred to [place] by my colleague Dr. Cecil Cobbs in Neuro-Oncology for evaluation and management of her Newly diagnosed Extranodal Marginal Zone Lymphoma. She is accompanied today by her sister [name] via [employer]. The pt reports that she is doing well overall.  Prior to today's visit, the pt presented to the ED on 07/21/18 with complaints of a headache for the past month which presented intermittently and was worsened by bending, coughing, or laughing. She had a CT Head and MRI Brain which revealed extensive dural thickening, as noted below. She then had a right posterior fossa craniectomy on 07/28/18 for open exicisional biopsies of the right cerebellar hemisphere and dura which revealed Extranodal marginal zone lymphoma. The pt then established care with my colleague Dr. Cecil Cobbs in Neuro-Onc on 08/11/18. She has been taking 4mg  Decadron daily.  The pt reports that developed worsened headaches about a month ago, and notes that when she laughed it felt like "electiricty" was running through the sides and back of her head. She notes that some mild headaches had begun a couple months ago. Denies double vision, changes in vision, loss of balance, or other senses being affected. The pt notes that in addition to this, she developed redness in the eyes in the past couple months, saw eye doctor and was prescribed eye drops. Denies other concerns in the last 6 months including bone pains, fatigue, fevers, chills, night sweats or unexpected weight loss. She notes that her headaches have resolved. She notes she began Decadron after her biopsy. She denies having "any symptoms  whatsoever," currently.   Of note prior to the patient's visit today, pt has had an MRI Brain completed on 07/21/18 with results revealing "Extensive dural thickening surrounding the right cerebellar hemisphere with mass-effect and edema in the right cerebellum. The process appears to extend into the upper cervical canal. Mild obstructive hydrocephalus. Differential includes tumor including metastatic disease and lymphoma. Chronic inflammatory process such as sarcoid is a consideration however chest x-ray negative. TB and atypical infection/fungus also possible. 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  On review of systems, pt reports good energy levels, headache resolution, and denies joint issues, skin rashes, dry mouth, dry eyes, fevers, chills, night sweats, bone pains, unexpected weight loss, fatigue, double vision, loss of vision, affected senses, loss of balance, and any other symptoms.   On PMHx the pt reports sickle cel trait, HTN, hyperlipidemia. On Social Hx the pt reports that she smoked 1 ppd for the last 42 years, and reports having quit two days ago. 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 On Family Hx the pt reports dad with lupus and sister with sarcoidosis. Daughter and son with sickle cell disease. Denies other blood disorders or cancers. Endorses Penicillin allergy  Interval History:   Amber Drake returns today for management and evaluation of her CNS Extranodal Marginal Zone Lymphoma.  She is here for her maintenance Rituxan. The patient's last visit with [place] was on 05/20/2019. The pt reports that she is doing well overall.  The pt reports that she still has boils in her inguinal area. Pt was using a topical  antibiotics, which have helped them resolve previously, but has not been to see her PCP since our last meeting. She has had to have these boils removed surgically in the past, but does not want to continue to have them removed that way. She has otherwise been feeling and eating well.   Lab results today (07/22/19) of CBC w/diff and CMP is as follows: all values are WNL except for Potassium at 3.2, Glucose at 110.  On review of systems, pt reports boils and denies headaches, vision changes, changes in speech, back pain, numbness/tingling in hands/fingers, upper extremity weakness, low appetite, unexpected weight loss, abdominal pain, dysuria, constipation, diarrhea, neck pain and any other symptoms.    MEDICAL HISTORY:  Past Medical History:  Diagnosis Date  . Anemia   . Arthritis   . Blood dyscrasia    sickle cell trait  . Carbuncle and furuncle   . Hypercholesterolemia   . Hypertension    does not take meds  . Sickle cell trait ([place])     SURGICAL HISTORY: Past Surgical History:  Procedure Laterality Date  . APPENDECTOMY    . APPLICATION OF CRANIAL NAVIGATION N/A 07/28/2018   Procedure: APPLICATION OF CRANIAL NAVIGATION;  Surgeon: Kary Kos, MD;  Location: [place];  Service: Neurosurgery;  Laterality: N/A;  . BRAIN SURGERY    . CARPAL TUNNEL RELEASE  03/23/2011   Procedure: CARPAL TUNNEL RELEASE;  Surgeon: Sanjuana Kava;  Location: [facility];  Service: Orthopedics;  Laterality: Left;  . CARPAL TUNNEL RELEASE  05/10/2011   Procedure: CARPAL TUNNEL RELEASE;  Surgeon: Sanjuana Kava, MD;  Location: [facility];  Service: Orthopedics;  Laterality: Right;  . CESAREAN SECTION     x 2  . INCISION AND DRAINAGE PERIRECTAL ABSCESS  12/21/09  . PR DURAL GRAFT REPAIR,SPINE DEFECT N/A 07/28/2018   Procedure: Stereotactic open biopsy of Right cerebellar hemisphere and dura with brainlab;  Surgeon: Kary Kos, MD;  Location: [place];  Service: Neurosurgery;  Laterality: N/A;  Stereotactic open biopsy of Right  cerebellar hemisphere and dura with brainlab  . PROCTOSCOPY  10/17/2010   Procedure: PROCTOSCOPY;  Surgeon: Scherry Ran;  Location: [facility];  Service: General;  Laterality: N/A;  Rigid Proctoscopy/Possible Fistula in Ano  Procedure ended at 1003  . THERAPEUTIC ABORTION     x2    SOCIAL HISTORY: Social History   Socioeconomic History  . Marital status: Legally Separated    Spouse name: Not on file  . Number of children: Not on file  . Years of education: Not on file  . Highest education level: Not on file  Occupational History  . Not on file  Tobacco Use  . Smoking status: Current Every Day Smoker    Packs/day: 0.50    Years: 30.00    Pack years: 15.00    Types: Cigarettes  . Smokeless tobacco: Never Used  Substance and Sexual Activity  . Alcohol use: No  . 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 Sexual activity: Yes    Partners: Male    Birth control/protection: None  Other Topics Concern  . Not on file  Social History Narrative  . Not on file   Social Determinants of Health  Financial Resource Strain:   . Difficulty of Paying Living Expenses:   Food Insecurity:   . Worried About Charity fundraiser in the Last Year:   . Arboriculturist in the Last Year:   Transportation Needs:   . Film/video editor (Medical):   Marland Kitchen Lack of Transportation (Non-Medical):   Physical Activity:   . Days of Exercise per Week:   . Minutes of Exercise per Session:   Stress:   . Feeling of Stress :   Social Connections:   . Frequency of Communication with Friends and Family:   . Frequency of Social Gatherings with Friends and Family:   . Attends Religious Services:   . Active Member of Clubs or Organizations:   . Attends Archivist Meetings:   Marland Kitchen Marital Status:   Intimate Partner Violence:   . Fear of Current or Ex-Partner:   . Emotionally Abused:   Marland Kitchen Physically Abused:   . Sexually Abused:     FAMILY HISTORY: Family History  Problem Relation  Age of Onset  . Kidney failure Daughter   . Sickle cell anemia Daughter   . Sickle cell trait Daughter   . Sickle cell anemia Son   . Hypertension Mother   . Hyperlipidemia Mother   . Hypertension Father   . Lupus Father        skin  . Hypertension Other   . Sarcoidosis Sister   . Anesthesia problems Neg Hx   . Hypotension Neg Hx   . Malignant hyperthermia Neg Hx   . Pseudochol deficiency Neg Hx     ALLERGIES:  is allergic to penicillins; penicillins cross reactors; and tape.  MEDICATIONS:  Current Outpatient Medications  Medication Sig Dispense Refill  . albuterol (PROAIR HFA) 108 (90 Base) MCG/ACT inhaler Inhale 2 puffs into the lungs 4 (four) times daily as needed.    Marland Kitchen atorvastatin (LIPITOR) 20 MG tablet Take 1 tablet (20 mg total) by mouth daily. 90 tablet 2  . clindamycin (CLINDAGEL) 1 % gel Apply topically 2 (two) times daily. To boils in the groin 30 g 1  . cyclobenzaprine (FLEXERIL) 10 MG tablet Take 1 tablet (10 mg total) by mouth 3 (three) times daily as needed for muscle spasms. (Patient not taking: Reported on 05/20/2019) 40 tablet 0  . dexamethasone (DECADRON) 1 MG tablet 1mg  po daily with breakfast till 8/24 then 1mg  po every other day for 10 days then stop 30 tablet 0  . doxycycline (VIBRAMYCIN) 100 MG capsule TAKE (1) CAPSULE BY MOUTH TWICE DAILY FOR 14 DAYS. 28 capsule 0  . ferrous sulfate 325 (65 FE) MG tablet Take 325 mg by mouth daily with breakfast.    . HYDROcodone-acetaminophen (NORCO/VICODIN) 5-325 MG tablet Take 1 tablet by mouth every 4 (four) hours as needed (for severe headaches.). 30 tablet 0  . lisinopril-hydrochlorothiazide (PRINZIDE,ZESTORETIC) 20-12.5 MG tablet Take 1 tablet by mouth daily.    . Multiple Vitamin (MULTIVITAMIN WITH MINERALS) TABS tablet Take 1 tablet by mouth daily.    . potassium chloride SA (KLOR-CON) 20 MEQ tablet Take 1 tablet (20 mEq total) by mouth 2 (two) times daily. 60 tablet 1  . ranitidine (ZANTAC) 150 MG capsule Take 150  mg by mouth 2 (two) times daily.    . traMADol (ULTRAM) 50 MG tablet Take 1 tablet (50 mg total) by mouth every 6 (six) hours as needed for moderate pain or severe pain. 30 tablet 0  . Vitamins/Minerals TABS Take by mouth.  No current facility-administered medications for this visit.   Facility-Administered Medications Ordered in Other Visits  Medication Dose Route Frequency Provider Last Rate Last Admin  . dexamethasone (DECADRON) 10 mg in sodium chloride 0.9 % 50 mL IVPB  10 mg Intravenous Once Brunetta Genera, MD 204 mL/hr at 07/22/19 1014 10 mg at 07/22/19 1014  . riTUXimab-pvvr (RUXIENCE) 700 mg in sodium chloride 0.9 % 180 mL infusion  375 mg/m2 (Treatment Plan Recorded) Intravenous Once Brunetta Genera, MD        REVIEW OF SYSTEMS:   A 10+ POINT REVIEW OF SYSTEMS WAS OBTAINED including neurology, dermatology, psychiatry, cardiac, respiratory, lymph, extremities, GI, GU, Musculoskeletal, constitutional, breasts, reproductive, HEENT.  All pertinent positives are noted in the HPI.  All others are negative.   PHYSICAL EXAMINATION: ECOG PERFORMANCE STATUS: 1 - Symptomatic but completely ambulatory  . Vitals:   07/22/19 0847  BP: 121/76  Pulse: 81  Resp: 18  Temp: 98 F (36.7 C)  SpO2: 100%   Filed Weights   07/22/19 0847  Weight: 176 lb 11.2 oz (80.2 kg)   .Body mass index is 27.27 kg/m.   GENERAL:alert, in no acute distress and comfortable SKIN: no acute rashes, no significant lesions EYES: conjunctiva are pink and non-injected, sclera anicteric OROPHARYNX: MMM, no exudates, no oropharyngeal erythema or ulceration NECK: supple, no JVD LYMPH:  no palpable lymphadenopathy in the cervical, axillary or inguinal regions LUNGS: clear to auscultation b/l with normal respiratory effort HEART: regular rate & rhythm ABDOMEN:  normoactive bowel sounds , non tender, not distended. No palpable hepatosplenomegaly.  Extremity: no pedal edema PSYCH: alert & oriented x 3  with fluent speech NEURO: no focal motor/sensory deficits  LABORATORY DATA:  I have reviewed the data as listed  . CBC Latest Ref Rng & Units 07/22/2019 05/20/2019 03/24/2019  WBC 4.0 - 10.5 K/uL 5.4 5.5 5.6  Hemoglobin 12.0 - 15.0 g/dL 12.7 12.5 12.9  Hematocrit 36.0 - 46.0 % 38.3 37.0 38.7  Platelets 150 - 400 K/uL 349 396 401(H)    . CMP Latest Ref Rng & Units 07/22/2019 05/20/2019 03/24/2019  Glucose 70 - 99 mg/dL 110(H) 97 166(H)  BUN 6 - 20 mg/dL 15 10 12   Creatinine 0.44 - 1.00 mg/dL 0.82 0.75 0.81  Sodium 135 - 145 mmol/L 143 142 143  Potassium 3.5 - 5.1 mmol/L 3.2(L) 3.4(L) 3.6  Chloride 98 - 111 mmol/L 104 104 107  CO2 22 - 32 mmol/L 26 29 22   Calcium 8.9 - 10.3 mg/dL 9.8 9.9 9.6  Total Protein 6.5 - 8.1 g/dL 7.0 7.1 7.0  Total Bilirubin 0.3 - 1.2 mg/dL 0.3 0.3 0.3  Alkaline Phos 38 - 126 U/L 80 73 74  AST 15 - 41 U/L 16 17 18   ALT 0 - 44 U/L 12 16 16     09/12/18 BM Biopsy:    07/28/18 Right cerebellar hemisphere and dura biopsy:     RADIOGRAPHIC STUDIES: I have personally reviewed the radiological images as listed and agreed with the findings in the report. No results found.  ASSESSMENT & PLAN:  59 y.o. female with  1. 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 The process appears to extend into the upper cervical canal. Mild obstructive hydrocephalus. Differential includes tumor including metastatic disease and lymphoma. Chronic inflammatory process such as sarcoid is a consideration however chest x-ray negative. TB and  atypical infection/fungus also possible. 6 mm colloid cyst felt to be a separate problem."  07/25/18 CT C/A/P which did not reveal significant abnormality  09/12/18 BM Bx revealed no evidence of lymphoma  09/08/18 PET/CT revealed "No hypermetabolic mass or adenopathy identified within the chest abdomen or pelvis to suggest metabolically active tumor. 2. Nonspecific focus of increased uptake within the cord extending from T12 to L1. Cannot rule out additional site of CNS lymphoma. Consider further evaluation with contrast enhanced MRI through this area. 3. Aortic Atherosclerosis and Emphysema."  10/16/2018 MRI brain w and w/o contrast revealed "1. Resolved dural mass in the right posterior fossa with minimal smooth dural thickening that may be treatment related. Cerebellar edema and mass effect is also resolved. No new site of disease. 2. 6 mm colloid cyst."  10/20/2018 MRI cervical spine w and w/o contrast revealed "Negative for lymphoma. No acute abnormality. Central disc protrusion at C5-6 effaces the ventral thecal sac causing mild central canal narrowing. There is also a shallow disc bulge at C6-7 which narrows but does not efface the ventral thecal Sac."  01/13/2019 MRI Brain ([id number]) revealed "1. Stable and satisfactory post treatment appearance of the posterior fossa. 2. Stable small 6 mm colloid cyst. 3. No new intracranial abnormality."  05/06/2019 MRI Cervical Spine ([id number]) revealed "No evidence of lymphoma in the cervical spine. Previously noted dural enhancement in the posterior fossa and cervical canal has resolved. No cord compression or cord lesion. Chronic cervical spine degenerative changes are stable from the prior study."  PLAN: -Discussed pt labwork today, 07/22/19; blood counts and chemistries are nml, Potassium still low.  -No lab or clinical evidence of CNS Extranodal Marginal Zone Lymphoma progression at this time -Pt has no prohibitive toxicities from continuing  maintenance Rituxan at this time -Will continue maintenance Rituxan every two months, up to two years -Will rpt MRI Brain in 4 months, unless new symptomology -Recommend pt receive the COVID19 vaccine. Advised pt that her immunosuppressive treatment puts her at a higher risk of contracting a significant infection from the COVID19 virus.  -Recommend pt use warm compresses on boils and avoid shaving her pubic area with a razor -Recommend pt continue to increase dietary or supplemental Potassium  -Rx Clindamycin gel -Will see back in 2 months with labs    FOLLOW UP: RTC with labs, MD visit and next dose of maintenance RItuxan as scheduled on 09/23/2019   The total time spent in the appt was 30 minutes and more than 50% was on counseling and direct patient cares.  All of the patient's questions were answered with apparent satisfaction. The patient knows to call the clinic with any problems, questions or concerns.    Sullivan Lone MD [place] [facility] Hematology/Oncology Physician [facility]  (Office):       [phone] (Work cell):  [phone] (Fax):           [phone]  07/22/2019 10:20 AM  I, Yevette Edwards, am acting as a scribe for Dr. Sullivan Lone.   .I have reviewed the above documentation for accuracy and completeness, and I agree with the above. Brunetta Genera MD

## 2019-07-22 ENCOUNTER — Inpatient Hospital Stay: Payer: Medicaid Other | Attending: Internal Medicine

## 2019-07-22 ENCOUNTER — Inpatient Hospital Stay: Payer: Medicaid Other

## 2019-07-22 ENCOUNTER — Inpatient Hospital Stay (HOSPITAL_BASED_OUTPATIENT_CLINIC_OR_DEPARTMENT_OTHER): Payer: Medicaid Other | Admitting: Hematology

## 2019-07-22 ENCOUNTER — Telehealth: Payer: Self-pay

## 2019-07-22 ENCOUNTER — Other Ambulatory Visit: Payer: Self-pay

## 2019-07-22 VITALS — BP 121/76 | HR 81 | Temp 98.0°F | Resp 18 | Ht 67.5 in | Wt 176.7 lb

## 2019-07-22 VITALS — BP 118/67 | HR 70 | Temp 98.2°F | Resp 16

## 2019-07-22 DIAGNOSIS — Z79899 Other long term (current) drug therapy: Secondary | ICD-10-CM | POA: Insufficient documentation

## 2019-07-22 DIAGNOSIS — C884 Extranodal marginal zone B-cell lymphoma of mucosa-associated lymphoid tissue [MALT-lymphoma]: Secondary | ICD-10-CM | POA: Insufficient documentation

## 2019-07-22 DIAGNOSIS — F17210 Nicotine dependence, cigarettes, uncomplicated: Secondary | ICD-10-CM | POA: Insufficient documentation

## 2019-07-22 DIAGNOSIS — I10 Essential (primary) hypertension: Secondary | ICD-10-CM | POA: Diagnosis not present

## 2019-07-22 DIAGNOSIS — M1990 Unspecified osteoarthritis, unspecified site: Secondary | ICD-10-CM | POA: Insufficient documentation

## 2019-07-22 DIAGNOSIS — R519 Headache, unspecified: Secondary | ICD-10-CM | POA: Diagnosis not present

## 2019-07-22 DIAGNOSIS — Z5112 Encounter for antineoplastic immunotherapy: Secondary | ICD-10-CM | POA: Insufficient documentation

## 2019-07-22 DIAGNOSIS — D573 Sickle-cell trait: Secondary | ICD-10-CM | POA: Insufficient documentation

## 2019-07-22 DIAGNOSIS — E785 Hyperlipidemia, unspecified: Secondary | ICD-10-CM | POA: Diagnosis not present

## 2019-07-22 DIAGNOSIS — Z7189 Other specified counseling: Secondary | ICD-10-CM

## 2019-07-22 LAB — CBC WITH DIFFERENTIAL/PLATELET
Abs Immature Granulocytes: 0.02 10*3/uL (ref 0.00–0.07)
Basophils Absolute: 0 10*3/uL (ref 0.0–0.1)
Basophils Relative: 1 %
Eosinophils Absolute: 0.1 10*3/uL (ref 0.0–0.5)
Eosinophils Relative: 2 %
HCT: 38.3 % (ref 36.0–46.0)
Hemoglobin: 12.7 g/dL (ref 12.0–15.0)
Immature Granulocytes: 0 %
Lymphocytes Relative: 27 %
Lymphs Abs: 1.5 10*3/uL (ref 0.7–4.0)
MCH: 28.6 pg (ref 26.0–34.0)
MCHC: 33.2 g/dL (ref 30.0–36.0)
MCV: 86.3 fL (ref 80.0–100.0)
Monocytes Absolute: 0.5 10*3/uL (ref 0.1–1.0)
Monocytes Relative: 9 %
Neutro Abs: 3.3 10*3/uL (ref 1.7–7.7)
Neutrophils Relative %: 61 %
Platelets: 349 10*3/uL (ref 150–400)
RBC: 4.44 MIL/uL (ref 3.87–5.11)
RDW: 12.4 % (ref 11.5–15.5)
WBC: 5.4 10*3/uL (ref 4.0–10.5)
nRBC: 0 % (ref 0.0–0.2)

## 2019-07-22 LAB — CMP (CANCER CENTER ONLY)
ALT: 12 U/L (ref 0–44)
AST: 16 U/L (ref 15–41)
Albumin: 3.7 g/dL (ref 3.5–5.0)
Alkaline Phosphatase: 80 U/L (ref 38–126)
Anion gap: 13 (ref 5–15)
BUN: 15 mg/dL (ref 6–20)
CO2: 26 mmol/L (ref 22–32)
Calcium: 9.8 mg/dL (ref 8.9–10.3)
Chloride: 104 mmol/L (ref 98–111)
Creatinine: 0.82 mg/dL (ref 0.44–1.00)
GFR, Est AFR Am: >60 mL/min (ref >60)
GFR, Estimated: >60 mL/min (ref >60)
Glucose, Bld: 110 mg/dL — ABNORMAL HIGH (ref 70–99)
Potassium: 3.2 mmol/L — ABNORMAL LOW (ref 3.5–5.1)
Sodium: 143 mmol/L (ref 135–145)
Total Bilirubin: 0.3 mg/dL (ref 0.3–1.2)
Total Protein: 7 g/dL (ref 6.5–8.1)

## 2019-07-22 MED ORDER — FAMOTIDINE IN NACL 20-0.9 MG/50ML-% IV SOLN
20.0000 mg | Freq: Once | INTRAVENOUS | Status: AC
  Administered 2019-07-22: 20 mg via INTRAVENOUS

## 2019-07-22 MED ORDER — CLINDAMYCIN PHOSPHATE 1 % EX GEL: Freq: Two times a day (BID) | CUTANEOUS | 1 refills | Status: DC

## 2019-07-22 MED ORDER — ACETAMINOPHEN 325 MG PO TABS
ORAL_TABLET | ORAL | Status: AC
  Filled 2019-07-22: qty 2

## 2019-07-22 MED ORDER — FAMOTIDINE IN NACL 20-0.9 MG/50ML-% IV SOLN
INTRAVENOUS | Status: AC
  Filled 2019-07-22: qty 50

## 2019-07-22 MED ORDER — SODIUM CHLORIDE 0.9 % IV SOLN
Freq: Once | INTRAVENOUS | Status: AC
  Filled 2019-07-22: qty 250

## 2019-07-22 MED ORDER — DIPHENHYDRAMINE HCL 25 MG PO CAPS
ORAL_CAPSULE | ORAL | Status: AC
  Filled 2019-07-22: qty 2

## 2019-07-22 MED ORDER — SODIUM CHLORIDE 0.9 % IV SOLN
375.0000 mg/m2 | Freq: Once | INTRAVENOUS | Status: AC
  Administered 2019-07-22: 11:00:00 700 mg via INTRAVENOUS
  Filled 2019-07-22: qty 20

## 2019-07-22 MED ORDER — DIPHENHYDRAMINE HCL 25 MG PO CAPS
50.0000 mg | ORAL_CAPSULE | Freq: Once | ORAL | Status: AC
  Administered 2019-07-22: 10:00:00 50 mg via ORAL

## 2019-07-22 MED ORDER — SODIUM CHLORIDE 0.9 % IV SOLN
10.0000 mg | Freq: Once | INTRAVENOUS | Status: AC
  Administered 2019-07-22: 10 mg via INTRAVENOUS
  Filled 2019-07-22: qty 10

## 2019-07-22 MED ORDER — ACETAMINOPHEN 325 MG PO TABS
650.0000 mg | ORAL_TABLET | Freq: Once | ORAL | Status: AC
  Administered 2019-07-22: 650 mg via ORAL

## 2019-07-22 NOTE — Telephone Encounter (Signed)
Pharmacy wanted to know if it was okay to change clindamycin 1% gel prescription to solution for MEDICAID insurance purposes, advised that MD is okay with the change.

## 2019-07-22 NOTE — Patient Instructions (Signed)
[facility] Discharge Instructions for Patients Receiving Chemotherapy  Today you received the following chemotherapy agents: Rituximab   To help prevent nausea and vomiting after your treatment, we encourage you to take your nausea medication  as prescribed.    If you develop nausea and vomiting that is not controlled by your nausea medication, call the clinic.   BELOW ARE SYMPTOMS THAT SHOULD BE REPORTED IMMEDIATELY:  *FEVER GREATER THAN 100.5 F  *CHILLS WITH OR WITHOUT FEVER  NAUSEA AND VOMITING THAT IS NOT CONTROLLED WITH YOUR NAUSEA MEDICATION  *UNUSUAL SHORTNESS OF BREATH  *UNUSUAL BRUISING OR BLEEDING  TENDERNESS IN MOUTH AND THROAT WITH OR WITHOUT PRESENCE OF ULCERS  *URINARY PROBLEMS  *BOWEL PROBLEMS  UNUSUAL RASH Items with * indicate a potential emergency and should be followed up as soon as possible.  Feel free to call the clinic should you have any questions or concerns. The clinic phone number is [phone].  Please show the CHEMO ALERT CARD at check-in to the Emergency Department and triage nurse.   

## 2019-08-10 ENCOUNTER — Emergency Department (HOSPITAL_COMMUNITY)
Admission: EM | Admit: 2019-08-10 | Discharge: 2019-08-10 | Disposition: A | Discharge status: Home / Self Care | Payer: Medicaid Other | Attending: Emergency Medicine | Admitting: Emergency Medicine

## 2019-08-10 ENCOUNTER — Other Ambulatory Visit: Payer: Self-pay

## 2019-08-10 ENCOUNTER — Telehealth: Payer: Self-pay | Admitting: *Deleted

## 2019-08-10 ENCOUNTER — Other Ambulatory Visit: Payer: Self-pay | Admitting: Hematology

## 2019-08-10 ENCOUNTER — Encounter (HOSPITAL_COMMUNITY): Payer: Self-pay | Admitting: *Deleted

## 2019-08-10 DIAGNOSIS — M5441 Lumbago with sciatica, right side: Secondary | ICD-10-CM | POA: Diagnosis not present

## 2019-08-10 DIAGNOSIS — I10 Essential (primary) hypertension: Secondary | ICD-10-CM | POA: Insufficient documentation

## 2019-08-10 DIAGNOSIS — Z8572 Personal history of non-Hodgkin lymphomas: Secondary | ICD-10-CM | POA: Insufficient documentation

## 2019-08-10 DIAGNOSIS — Z79899 Other long term (current) drug therapy: Secondary | ICD-10-CM | POA: Insufficient documentation

## 2019-08-10 DIAGNOSIS — F17210 Nicotine dependence, cigarettes, uncomplicated: Secondary | ICD-10-CM | POA: Insufficient documentation

## 2019-08-10 DIAGNOSIS — M5442 Lumbago with sciatica, left side: Secondary | ICD-10-CM

## 2019-08-10 DIAGNOSIS — M545 Low back pain: Secondary | ICD-10-CM | POA: Diagnosis present

## 2019-08-10 MED ORDER — ACETAMINOPHEN 500 MG PO TABS
1000.0000 mg | ORAL_TABLET | Freq: Once | ORAL | Status: AC
  Administered 2019-08-10: 1000 mg via ORAL
  Filled 2019-08-10: qty 2

## 2019-08-10 MED ORDER — TRAMADOL HCL 50 MG PO TABS: 50.0000 mg | ORAL_TABLET | Freq: Four times a day (QID) | ORAL | 0 refills | Status: DC | PRN

## 2019-08-10 MED ORDER — PREDNISONE 20 MG PO TABS: ORAL_TABLET | ORAL | 0 refills | Status: DC

## 2019-08-10 MED ORDER — PREDNISONE 50 MG PO TABS
60.0000 mg | ORAL_TABLET | Freq: Once | ORAL | Status: AC
  Administered 2019-08-10: 60 mg via ORAL
  Filled 2019-08-10: qty 1

## 2019-08-10 NOTE — Discharge Instructions (Addendum)
It was our pleasure to provide your ER care today - we hope that you feel better.  Rest. Avoid heavy lifting > 20 lbs, or bending at waist for the next week.   Try heat therapy to sore area.   Take prednisone as prescribed. Take acetaminophen as need.   Follow up with primary care doctor in 1-2 weeks if symptoms fail to improve/resolve.  Return to ER if worse, new symptoms, fevers, intractable pain, numbness/weakness, or other concern.

## 2019-08-10 NOTE — Telephone Encounter (Signed)
Patient was seen in ED this morning for back pain radiating down left leg. She says pain is unbearable and that she was advised to take tylenol for it. She wants to know if Dr. Irene Limbo could give her anything stronger. Dr. Irene Limbo informed. Dr. Irene Limbo recommends patient use Lidocaine patch, or Voltaren Gel and he will prescribe Ultram for patient. Patient verbalized understanding, stating she had already tried patch. Note: Prescription could not be sent electronically due to equipment malfunction. Dr. Irene Limbo gave this writer the printed prescription and asked it to be called to pharmacy if possible. If not, contact patient to pick up RX at office. Contacted [facility], verbal Rx given to [place].

## 2019-08-10 NOTE — ED Provider Notes (Signed)
[facility] EMERGENCY DEPARTMENT Provider Note   CSN: [id number] Arrival date & time: 08/10/19  [id number]     History Chief Complaint  Patient presents with  . Back Pain    Amber Drake is a 59 y.o. female.  Patient c/o left low back pain radiating down posterior/lateral left leg towards foot. Symptoms acute onset in past week, constant, dull to sharp, radiating, worse w certain movements/position changes/bending. Denies hx ddd. No specific back injury or strain but states her work did involve bending/lifting. No perineal or saddle area numbness. No leg numbness or weakness. No urinary retention or incontinence. No stool incontinence. No fever or chills. Does not otherwise feel sick or ill. No midline/spine pain. No leg swelling.   The history is provided by the patient.  Back Pain Associated symptoms: no abdominal pain, no chest pain, no fever, no headaches, no numbness and no weakness        Past Medical History:  Diagnosis Date  . Anemia   . Arthritis   . Blood dyscrasia    sickle cell trait  . Carbuncle and furuncle   . Hypercholesterolemia   . Hypertension    does not take meds  . Sickle cell trait [facility])     Patient Active Problem List   Diagnosis Date Noted  . 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 Counseling regarding advance care planning and goals of care 10/23/2018  . 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 Brain tumor ([place]) 07/28/2018  . Hidradenitis 07/31/2016  . Recurrent boils 07/31/2016  . Hyperlipidemia 06/15/2015  . Pain of left hand 05/19/2015  . Hand pain, right 05/19/2015  . Hematuria 03/03/2015  . Esophageal reflux 03/03/2015  . Essential hypertension, benign 03/03/2015  . Bronchitis due to tobacco use 10/17/2010    Past Surgical History:  Procedure Laterality Date  . APPENDECTOMY    . APPLICATION OF CRANIAL NAVIGATION N/A 07/28/2018   Procedure: APPLICATION OF CRANIAL NAVIGATION;  Surgeon: Kary Kos, MD;   Location: [place];  Service: Neurosurgery;  Laterality: N/A;  . BRAIN SURGERY    . CARPAL TUNNEL RELEASE  03/23/2011   Procedure: CARPAL TUNNEL RELEASE;  Surgeon: Sanjuana Kava;  [facility];  Service: Orthopedics;  Laterality: Left;  . CARPAL TUNNEL RELEASE  05/10/2011   Procedure: CARPAL TUNNEL RELEASE;  Surgeon: Sanjuana Kava, MD;  Location: [facility];  Service: Orthopedics;  Laterality: Right;  . CESAREAN SECTION     x 2  . INCISION AND DRAINAGE PERIRECTAL ABSCESS  12/21/09  . PR DURAL GRAFT REPAIR,SPINE DEFECT N/A 07/28/2018   Procedure: Stereotactic open biopsy of Right cerebellar hemisphere and dura with brainlab;  Surgeon: Kary Kos, MD;  Location: [place];  Service: Neurosurgery;  Laterality: N/A;  Stereotactic open biopsy of Right cerebellar hemisphere and dura with brainlab  . PROCTOSCOPY  10/17/2010   Procedure: PROCTOSCOPY;  Surgeon: Scherry Ran;  Location: [facility];  Service: General;  Laterality: N/A;  Rigid Proctoscopy/Possible Fistula in Ano  Procedure ended at 1003  . THERAPEUTIC ABORTION     x2     OB History    Gravida  4   Para  2   Term  2   Preterm      AB  2   Living  2     SAB      TAB      Ectopic      Multiple      Live Births  2  Family History  Problem Relation Age of Onset  . Kidney failure Daughter   . Sickle cell anemia Daughter   . Sickle cell trait Daughter   . Sickle cell anemia Son   . Hypertension Mother   . Hyperlipidemia Mother   . Hypertension Father   . Lupus Father        skin  . Hypertension Other   . Sarcoidosis Sister   . Anesthesia problems Neg Hx   . Hypotension Neg Hx   . Malignant hyperthermia Neg Hx   . Pseudochol deficiency Neg Hx     Social History   Tobacco Use  . Smoking status: Current Every Day Smoker    Packs/day: 0.50    Years: 30.00    Pack years: 15.00    Types: Cigarettes  . Smokeless tobacco: Never Used  Substance Use Topics  . Alcohol use: No  . 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 Authorizing Provider  albuterol (PROAIR HFA) 108 (90 Base) MCG/ACT inhaler Inhale 2 puffs into the lungs 4 (four) times daily as needed. 02/25/17   [provider]  atorvastatin (LIPITOR) 20 MG tablet Take 1 tablet (20 mg total) by mouth daily. 03/14/15   Soyla Dryer, PA-C  clindamycin (CLINDAGEL) 1 % gel Apply topically 2 (two) times daily. To boils in the groin 07/22/19   Brunetta Genera, MD  cyclobenzaprine (FLEXERIL) 10 MG tablet Take 1 tablet (10 mg total) by mouth 3 (three) times daily as needed for muscle spasms. Patient not taking: Reported on 05/20/2019 11/07/16   Sanjuana Kava, MD  dexamethasone (DECADRON) 1 MG tablet 1mg  po daily with breakfast till 8/24 then 1mg  po every other day for 10 days then stop 11/17/18   Brunetta Genera, MD  doxycycline (VIBRAMYCIN) 100 MG capsule TAKE (1) CAPSULE BY MOUTH TWICE DAILY FOR 14 DAYS. 04/21/19   Jonnie Kind, MD  ferrous sulfate 325 (65 FE) MG tablet Take 325 mg by mouth daily with breakfast.    [provider]  HYDROcodone-acetaminophen (NORCO/VICODIN) 5-325 MG tablet Take 1 tablet by mouth every 4 (four) hours as needed (for severe headaches.). 03/13/19   Brunetta Genera, MD  lisinopril-hydrochlorothiazide (PRINZIDE,ZESTORETIC) 20-12.5 MG tablet Take 1 tablet by mouth daily.    [provider]  Multiple Vitamin (MULTIVITAMIN WITH MINERALS) TABS tablet Take 1 tablet by mouth daily.    [provider]  potassium chloride SA (KLOR-CON) 20 MEQ tablet Take 1 tablet (20 mEq total) by mouth 2 (two) times daily. 01/21/19   Brunetta Genera, MD  ranitidine (ZANTAC) 150 MG capsule Take 150 mg by mouth 2 (two) times daily.    [provider]  traMADol (ULTRAM) 50 MG tablet Take 1 tablet (50 mg total) by mouth every 6 (six) hours as needed for moderate pain or  severe pain. 01/20/19   Jonnie Kind, MD  Vitamins/Minerals TABS Take by mouth.    [provider]    Allergies    Penicillins, Penicillins cross reactors, and Tape  Review of Systems   Review of Systems  Constitutional: Negative for chills and fever.  HENT: Negative for sore throat.   Eyes: Negative for redness.  Respiratory: Negative for shortness of breath.   Cardiovascular: Negative for chest pain.  Gastrointestinal: Negative for abdominal pain.  Genitourinary: Negative for difficulty urinating.  Musculoskeletal: Positive for  back pain. Negative for neck pain.  Skin: Negative for rash.  Neurological: Negative for weakness, numbness and headaches.  Hematological: Does not bruise/bleed easily.  Psychiatric/Behavioral: Negative for confusion.    Physical Exam Updated Vital Signs BP (!) 142/61   Pulse 86   Temp 98 F (36.7 C) (Oral)   Resp 20   Ht 1.715 m (5' 7.5")   Wt 80.1 kg   LMP 08/12/2010   SpO2 100%   BMI 27.25 kg/m   Physical Exam Vitals and nursing note reviewed.  Constitutional:      Appearance: Normal appearance. She is well-developed.  HENT:     Head: Atraumatic.     Nose: Nose normal.     Mouth/Throat:     Mouth: Mucous membranes are moist.  Eyes:     General: No scleral icterus.    Conjunctiva/sclera: Conjunctivae normal.  Neck:     Trachea: No tracheal deviation.  Cardiovascular:     Rate and Rhythm: Normal rate.     Pulses: Normal pulses.  Pulmonary:     Effort: Pulmonary effort is normal. No respiratory distress.  Abdominal:     General: There is no distension.     Palpations: Abdomen is soft.     Tenderness: There is no abdominal tenderness.  Genitourinary:    Comments: No cva tenderness.  Musculoskeletal:        General: No swelling.     Cervical back: Normal range of motion and neck supple. No rigidity. No muscular tenderness.     Comments: T/L/S spine non tender, aligned, no step off. Left lumbar muscular tenderness.  Tenderness at left sciatic notch. Good rom left hip and knee without pain. Distal pulses palp. No leg swelling. No skin changes, or erythema.   Skin:    General: Skin is warm and dry.     Findings: No rash.  Neurological:     Mental Status: She is alert.     Comments: Alert, speech normal. Motor/sens grossly intact bil. LLE NVI with normal strength/sensation. Reflexes 2+. Steady gait.   Psychiatric:        Mood and Affect: Mood normal.     ED Results / Procedures / Treatments   Labs (all labs ordered are listed, but only abnormal results are displayed) Labs Reviewed - No data to display  EKG None  Radiology No results found.  Procedures Procedures (including critical care time)  Medications Ordered in ED Medications  predniSONE (DELTASONE) tablet 60 mg (has no administration in time range)    ED Course  I have reviewed the triage vital signs and the nursing notes.  Pertinent labs & imaging results that were available during my care of the patient were reviewed by me and considered in my medical decision making (see chart for details).    MDM Rules/Calculators/A&P                      Prednisone po. Acetaminophen po (pt drove self to ED).   Reviewed nursing notes and prior charts for additional history.   RX for home.   No midline/spine tenderness.   Pt stable for discharge. Return precautions provided.     Final Clinical Impression(s) / ED Diagnoses Final diagnoses:  None    Rx / DC Orders ED Discharge Orders    None       Lajean Saver, MD 08/10/19 [phone]

## 2019-08-10 NOTE — ED Triage Notes (Signed)
Pt c/o lower back pain that radiates to left leg, denies any injury,

## 2019-09-14 ENCOUNTER — Emergency Department (HOSPITAL_COMMUNITY): Payer: Medicaid Other

## 2019-09-14 ENCOUNTER — Other Ambulatory Visit: Payer: Self-pay

## 2019-09-14 ENCOUNTER — Encounter (HOSPITAL_COMMUNITY): Payer: Self-pay

## 2019-09-14 ENCOUNTER — Emergency Department (HOSPITAL_COMMUNITY)
Admission: EM | Admit: 2019-09-14 | Discharge: 2019-09-14 | Disposition: A | Discharge status: Home / Self Care | Payer: Medicaid Other | Attending: Emergency Medicine | Admitting: Emergency Medicine

## 2019-09-14 DIAGNOSIS — R0789 Other chest pain: Secondary | ICD-10-CM

## 2019-09-14 DIAGNOSIS — Z79899 Other long term (current) drug therapy: Secondary | ICD-10-CM | POA: Diagnosis not present

## 2019-09-14 DIAGNOSIS — I10 Essential (primary) hypertension: Secondary | ICD-10-CM | POA: Diagnosis not present

## 2019-09-14 DIAGNOSIS — F17210 Nicotine dependence, cigarettes, uncomplicated: Secondary | ICD-10-CM | POA: Diagnosis not present

## 2019-09-14 DIAGNOSIS — Y929 Unspecified place or not applicable: Secondary | ICD-10-CM | POA: Insufficient documentation

## 2019-09-14 DIAGNOSIS — X500XXA Overexertion from strenuous movement or load, initial encounter: Secondary | ICD-10-CM | POA: Diagnosis not present

## 2019-09-14 DIAGNOSIS — Y93F2 Activity, caregiving, lifting: Secondary | ICD-10-CM | POA: Diagnosis not present

## 2019-09-14 DIAGNOSIS — Y999 Unspecified external cause status: Secondary | ICD-10-CM | POA: Diagnosis not present

## 2019-09-14 DIAGNOSIS — Z7902 Long term (current) use of antithrombotics/antiplatelets: Secondary | ICD-10-CM | POA: Insufficient documentation

## 2019-09-14 HISTORY — DX: Malignant (primary) neoplasm, unspecified: C80.1

## 2019-09-14 IMAGING — DX DG CHEST 2V
2 series · 2 of 2 positions shown · non-contrast
Comparison: Chest CT [DATE] and earlier.

CLINICAL DATA: 58-year-old female with sternal pain after lifting
heavy TV 3 days ago.

EXAM:
CHEST - 2 VIEW

[chest pa]
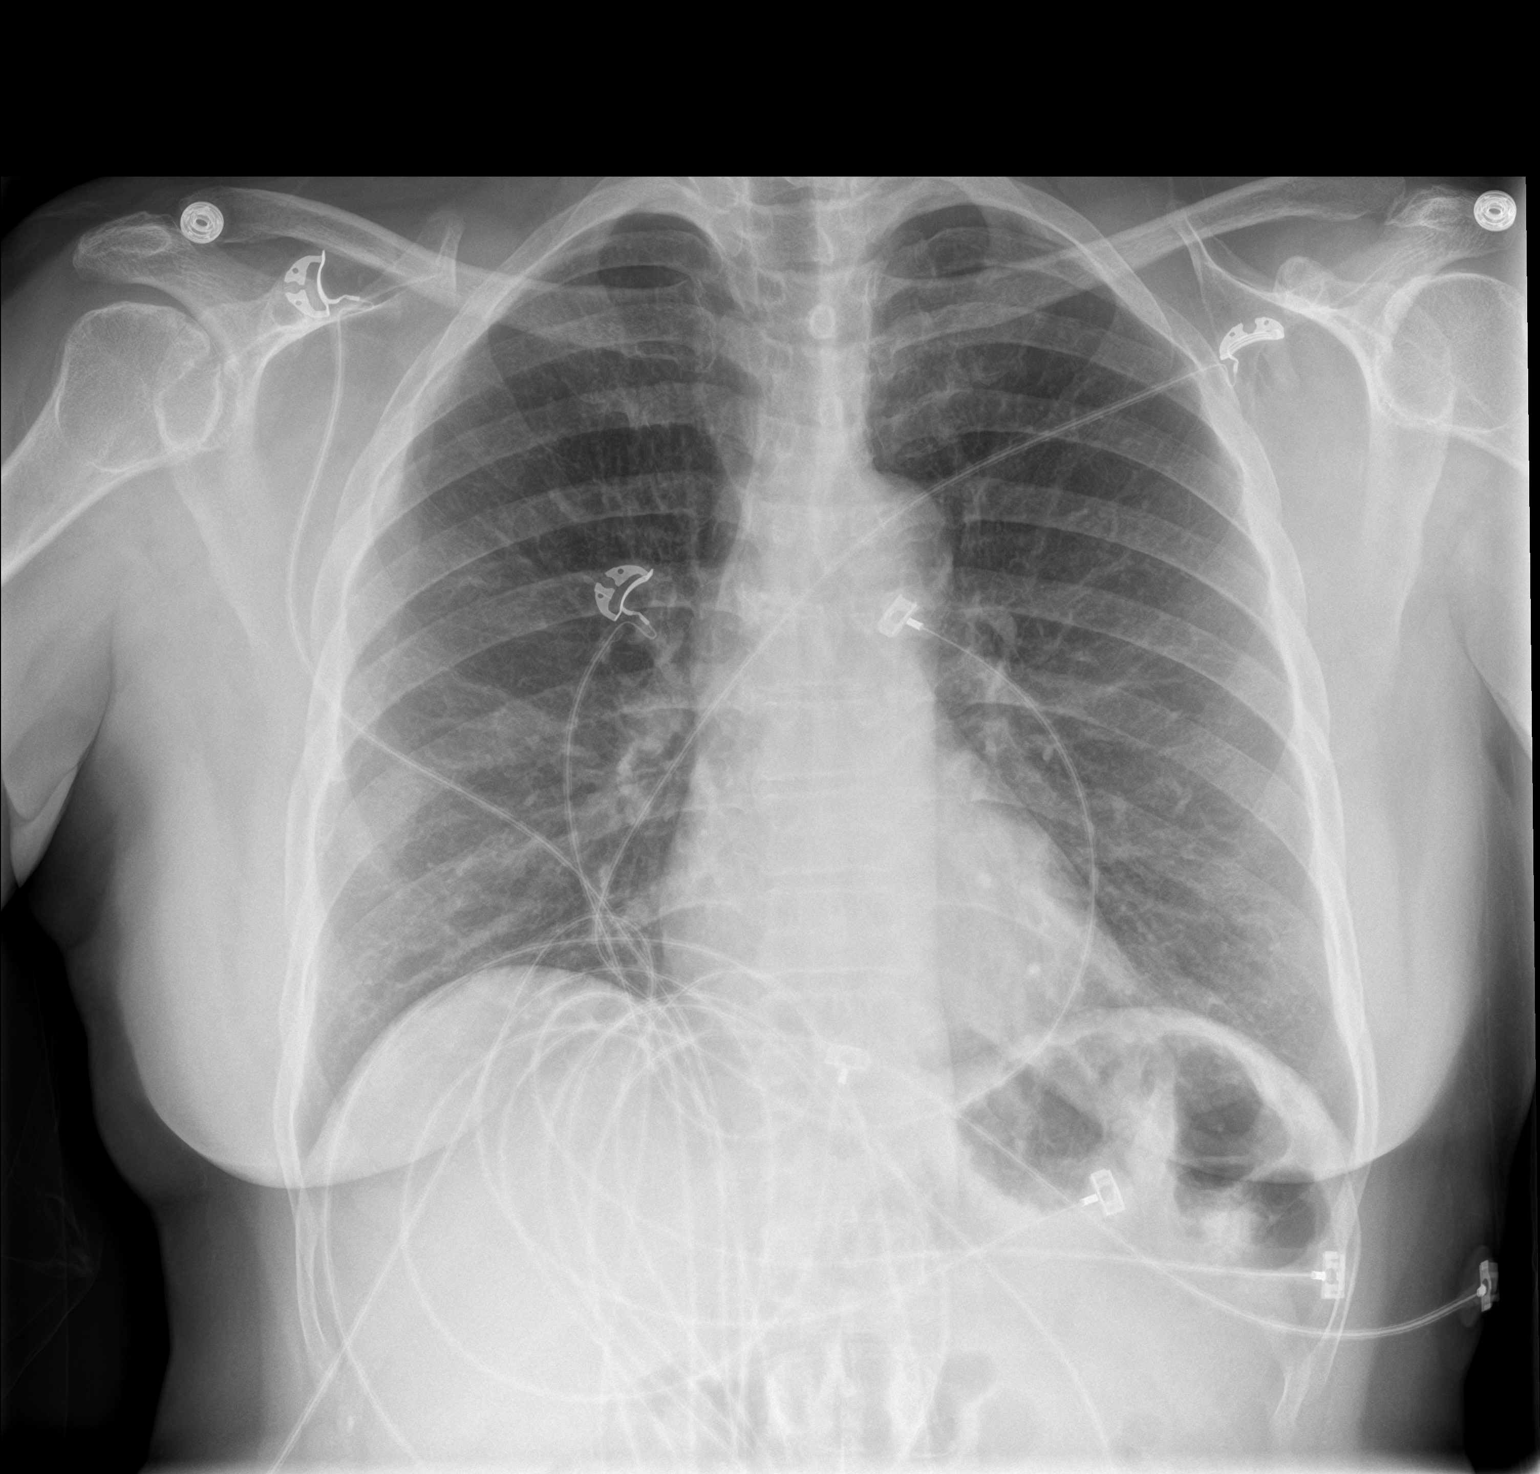

[chest lat]
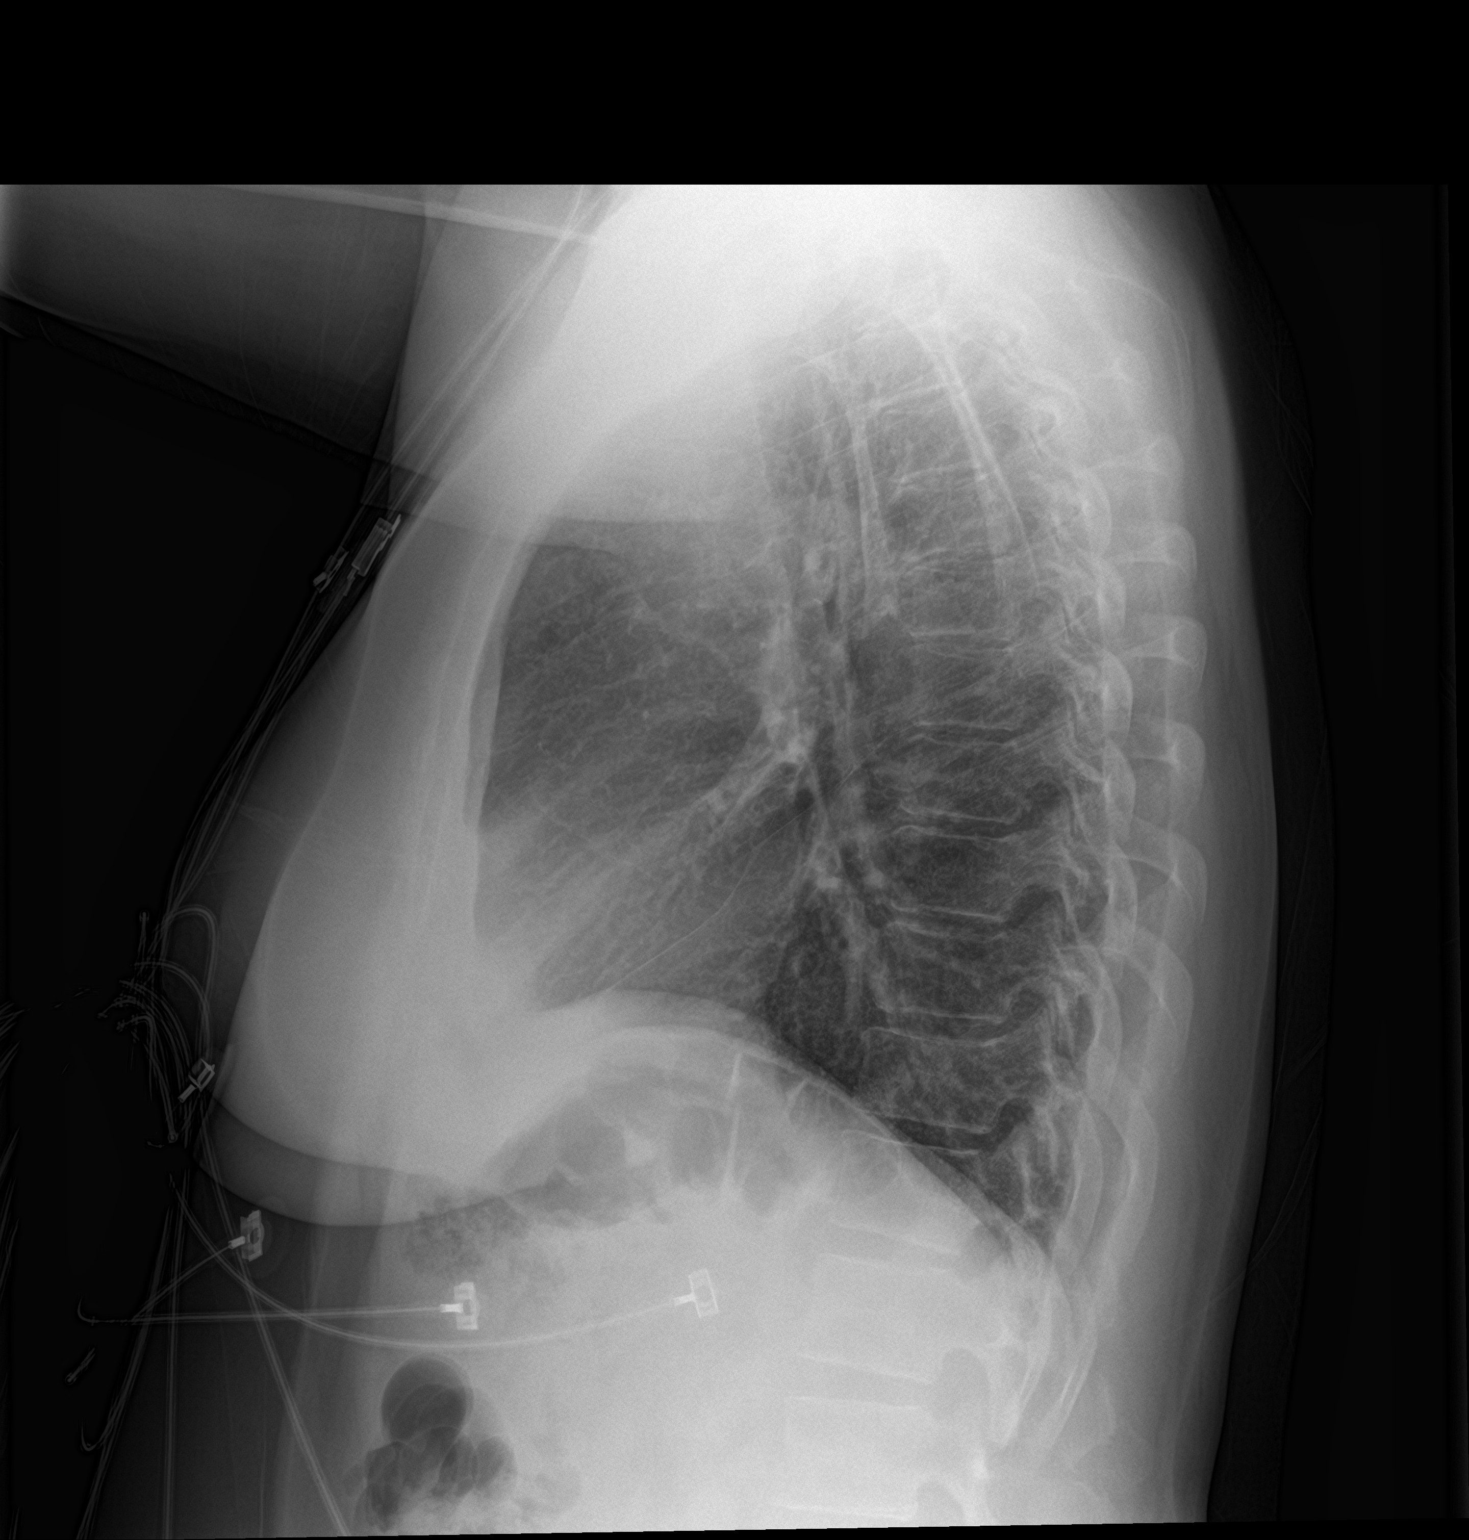

[2 of 2 positions shown; findings below may reference images not displayed]

FINDINGS: PA and lateral views. Lung volumes and mediastinal contours remain
normal. Visualized tracheal air column is within normal limits. The
lungs appear stable and clear. No pneumothorax or pleural effusion.

On the lateral view the sternal contour appears stable and within
normal limits. No acute osseous abnormality identified. Negative
visible bowel gas pattern.
IMPRESSION: No acute cardiopulmonary abnormality or recent injury identified.

## 2019-09-14 NOTE — Discharge Instructions (Addendum)
Your exam today is reassuring and your chest x-ray is negative.  As discussed I did not expect any bony injuries from your activity, but your x-ray is reassuring for no evidence for metastatic cancer problems in your ribs or sternum.  You may continue using a heating pad to help heal the site.  Also taking Tylenol or Motrin may help.  Plan to see your doctor for recheck if symptoms persist or worsen.

## 2019-09-14 NOTE — ED Provider Notes (Signed)
[facility] EMERGENCY DEPARTMENT Provider Note   CSN: [id number] Arrival date & time: 09/14/19  [id number]     History Chief Complaint  Patient presents with   Chest Pain    Amber Drake is a 58 y.o. female presenting with localized anterior right chest wall and sternal pain which started 3 days ago after lifting and carrying a heavy television set.  She describes soreness with direct palpation and with torso twisting and moving arms overhead.  She denies sob, pleuritic pain, dizziness, cough, fevers, palpitations. She has a history of cerebellar meningeal lymphoma and is concerned about this pain being related to this diagnosis.  She is currently receiving maintenance Rituxan therapy.  She has used a heating pad to the site without significant improvement.  The history is provided by the patient.       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   Brain tumor ([place]) 07/28/2018   Hidradenitis 07/31/2016   Recurrent boils 07/31/2016   Hyperlipidemia 06/15/2015   Pain of left hand 05/19/2015   Hand pain, right 05/19/2015   Hematuria 03/03/2015   Esophageal reflux 03/03/2015   Essential hypertension, benign 03/03/2015   Bronchitis due to tobacco use 10/17/2010    Past Surgical History:  Procedure Laterality Date   APPENDECTOMY     APPLICATION OF CRANIAL NAVIGATION N/A 07/28/2018   Procedure: APPLICATION OF CRANIAL NAVIGATION;  Surgeon: Kary Kos, MD;  Location: [place];  Service: Neurosurgery;  Laterality: N/A;   BRAIN  SURGERY     CARPAL TUNNEL RELEASE  03/23/2011   Procedure: CARPAL TUNNEL RELEASE;  Surgeon: Sanjuana Kava;  [facility];  Service: Orthopedics;  Laterality: Left;   CARPAL TUNNEL RELEASE  05/10/2011   Procedure: CARPAL TUNNEL RELEASE;  Surgeon: Sanjuana Kava, MD;  [facility];  Service: Orthopedics;  Laterality: Right;   CESAREAN SECTION     x 2   INCISION AND DRAINAGE PERIRECTAL ABSCESS  12/21/09   PR DURAL GRAFT REPAIR,SPINE DEFECT N/A 07/28/2018   Procedure: Stereotactic open biopsy of Right cerebellar hemisphere and dura with brainlab;  Surgeon: Kary Kos, MD;  Location: [place];  Service: Neurosurgery;  Laterality: N/A;  Stereotactic open biopsy of Right cerebellar hemisphere and dura with brainlab   PROCTOSCOPY  10/17/2010   Procedure: PROCTOSCOPY;  Surgeon: Scherry Ran;  Location: [facility];  Service: General;  Laterality: N/A;  Rigid Proctoscopy/Possible Fistula in Ano  Procedure ended at [place]     x2     OB History    Gravida  4   Para  2   Term  2   Preterm      AB  2   Living  2     SAB      TAB      Ectopic      Multiple      Live Births  2  Family History  Problem Relation Age of Onset   Kidney failure Daughter    Sickle cell anemia Daughter    Sickle cell trait Daughter    Sickle cell anemia Son    Hypertension Mother    Hyperlipidemia Mother    Hypertension Father    Lupus Father        skin   Hypertension Other    Sarcoidosis Sister    Anesthesia problems Neg Hx    Hypotension Neg Hx    Malignant hyperthermia Neg Hx    Pseudochol deficiency Neg Hx     Social History   Tobacco Use   Smoking status: Current Every Day Smoker    Packs/day: 0.50    Years: 30.00    Pack years: 15.00    Types: Cigarettes   Smokeless tobacco: Never Used  Scientific laboratory technician Use: Never used  Substance Use Topics   Alcohol use: No   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 Authorizing Provider  albuterol (PROAIR HFA) 108 (90 Base) MCG/ACT inhaler Inhale 2 puffs into the lungs 4 (four) times daily as needed. 02/25/17   [provider]  atorvastatin (LIPITOR) 20 MG tablet Take 1 tablet (20 mg total) by mouth daily. 03/14/15   Soyla Dryer, PA-C  clindamycin (CLINDAGEL) 1 % gel Apply topically 2 (two) times daily. To boils in the groin 07/22/19   Brunetta Genera, MD  cyclobenzaprine (FLEXERIL) 10 MG tablet Take 1 tablet (10 mg total) by mouth 3 (three) times daily as needed for muscle spasms. Patient not taking: Reported on 05/20/2019 11/07/16   Sanjuana Kava, MD  dexamethasone (DECADRON) 1 MG tablet 1mg  po daily with breakfast till 8/24 then 1mg  po every other day for 10 days then stop 11/17/18   Brunetta Genera, MD  doxycycline (VIBRAMYCIN) 100 MG capsule TAKE (1) CAPSULE BY MOUTH TWICE DAILY FOR 14 DAYS. 04/21/19   Jonnie Kind, MD  ferrous sulfate 325 (65 FE) MG tablet Take 325 mg by mouth daily with breakfast.    [provider]  lisinopril-hydrochlorothiazide (PRINZIDE,ZESTORETIC) 20-12.5 MG tablet Take 1 tablet by mouth daily.    [provider]  Multiple Vitamin (MULTIVITAMIN WITH MINERALS) TABS tablet Take 1 tablet by mouth daily.    [provider]  potassium chloride SA (KLOR-CON) 20 MEQ tablet Take 1 tablet (20 mEq total) by mouth 2 (two) times daily. 01/21/19   Brunetta Genera, MD  predniSONE (DELTASONE) 20 MG tablet 3 po once a day for 2 days, then 2 po once a day for 3 days, then 1 po once a day for 3 days 08/11/19   Lajean Saver, MD  ranitidine (ZANTAC) 150 MG capsule Take 150 mg by mouth 2 (two) times daily.    [provider]  traMADol (ULTRAM) 50 MG tablet Take 1 tablet (50 mg total) by mouth every 6 (six) hours as needed for moderate pain or severe pain. 08/10/19   Brunetta Genera, MD   Vitamins/Minerals TABS Take by mouth.    [provider]    Allergies    Penicillins, Penicillins cross reactors, and Tape  Review of Systems   Review of Systems  Constitutional: Negative for chills and fever.  HENT: Negative for congestion and sore throat.   Eyes: Negative.   Respiratory: Negative for cough, chest tightness, shortness of  breath and wheezing.   Cardiovascular: Positive for chest pain. Negative for palpitations.  Gastrointestinal: Negative for abdominal pain, nausea and vomiting.  Genitourinary: Negative.   Musculoskeletal: Negative.        Negative except as mentioned in HPI.   Skin: Negative.  Negative for wound.  Neurological: Negative for dizziness and weakness.  Psychiatric/Behavioral: Negative.     Physical Exam Updated Vital Signs BP 113/74    Pulse 79    Temp 97.8 F (36.6 C)    Resp 15    Wt 80 kg    LMP 08/12/2010    SpO2 100%    BMI 27.22 kg/m   Physical Exam Vitals and nursing note reviewed.  Constitutional:      Appearance: She is well-developed.  HENT:     Head: Normocephalic and atraumatic.  Eyes:     Conjunctiva/sclera: Conjunctivae normal.  Cardiovascular:     Rate and Rhythm: Normal rate and regular rhythm.     Heart sounds: Normal heart sounds.  Pulmonary:     Effort: Pulmonary effort is normal.     Breath sounds: Normal breath sounds. No wheezing or rhonchi.  Chest:     Chest wall: Tenderness present.       Comments: ttp upper sternal bordern right rib cage.  No deformity, crepitus or edema. Abdominal:     General: Bowel sounds are normal.     Palpations: Abdomen is soft.     Tenderness: There is no abdominal tenderness.  Musculoskeletal:        General: Normal range of motion.     Cervical back: Normal range of motion.  Skin:    General: Skin is warm and dry.  Neurological:     Mental Status: She is alert.     ED Results / Procedures / Treatments   Labs (all labs ordered are listed, but only abnormal  results are displayed) Labs Reviewed - No data to display  EKG EKG Interpretation  Date/Time:  Monday September 14 2019 10:05:07 EDT Ventricular Rate:  76 PR Interval:    QRS Duration: 87 QT Interval:  394 QTC Calculation: 443 R Axis:   70 Text Interpretation: Sinus rhythm Anteroseptal infarct, old No acute changes No significant change since last tracing Confirmed by Varney Biles [phone]) on 09/14/2019 10:42:28 AM   Radiology DG Chest 2 View  Result Date: 09/14/2019 CLINICAL DATA:  59 year old female with sternal pain after lifting heavy TV 3 days ago. EXAM: CHEST - 2 VIEW COMPARISON:  Chest CT 07/25/2018 and earlier. FINDINGS: PA and lateral views. Lung volumes and mediastinal contours remain normal. Visualized tracheal air column is within normal limits. The lungs appear stable and clear. No pneumothorax or pleural effusion. On the lateral view the sternal contour appears stable and within normal limits. No acute osseous abnormality identified. Negative visible bowel gas pattern. IMPRESSION: No acute cardiopulmonary abnormality or recent injury identified. Electronically Signed   By: Genevie Ann M.D.   On: 09/14/2019 11:40    Procedures Procedures (including critical care time)  Medications Ordered in ED Medications - No data to display  ED Course  I have reviewed the triage vital signs and the nursing notes.  Pertinent labs & imaging results that were available during my care of the patient were reviewed by me and considered in my medical decision making (see chart for details).    MDM Rules/Calculators/A&P  Pt with reproducible chest wall and right rib pain, sustained with lifting heavy object.  No hx or sx to suggest cardiopulmonary source.  Imaging reviewed and negative.  Advised continued heat tx, may add tylenol or ibuprofen for additional sx relief.  The patient appears reasonably screened and/or stabilized for discharge and I doubt any other medical  condition or other [facility] requiring further screening, evaluation, or treatment in the ED at this time prior to discharge.  Final Clinical Impression(s) / ED Diagnoses Final diagnoses:  Chest wall pain    Rx / DC Orders ED Discharge Orders    None       Landis Martins 09/14/19 [id number]    Varney Biles, MD 09/15/19 0745

## 2019-09-14 NOTE — ED Triage Notes (Signed)
Pt reports sternum tender to touch after lifting a TV 3 days ago.

## 2019-09-17 NOTE — Progress Notes (Signed)
Pharmacist Chemotherapy Monitoring - Follow Up Assessment    I verify that I have reviewed each item in the below checklist:  . Regimen for the patient is scheduled for the appropriate day and plan matches scheduled date. Marland Kitchen Appropriate non-routine labs are ordered dependent on drug ordered. . If applicable, additional medications reviewed and ordered per protocol based on lifetime cumulative doses and/or treatment regimen.   Plan for follow-up and/or issues identified: No . I-vent associated with next due treatment: No . MD and/or nursing notified: No   Kennith Center, Pharm.D., CPP 09/17/2019@10 :22 AM

## 2019-09-22 NOTE — Progress Notes (Signed)
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 female who has been referred to [place] by my colleague Dr. Cecil Drake in Neuro-Oncology for evaluation and management of her Newly diagnosed Extranodal Marginal Zone Lymphoma. She is accompanied today by her sister Amber Drake via [employer]. The pt reports that she is doing well overall.  Prior to today's visit, the pt presented to the ED on 07/21/18 with complaints of a headache for the past month which presented intermittently and was worsened by bending, coughing, or laughing. She had a CT Head and MRI Brain which revealed extensive dural thickening, as noted below. She then had a right posterior fossa craniectomy on 07/28/18 for open exicisional biopsies of the right cerebellar hemisphere and dura which revealed Extranodal marginal zone lymphoma. The pt then established care with my colleague Dr. Cecil Drake in Neuro-Onc on 08/11/18. She has been taking 4mg  Decadron daily.  The pt reports that developed worsened headaches about a month ago, and notes that when she laughed it felt like "electiricty" was running through the sides and back of her head. She notes that some mild headaches had begun a couple months ago. Denies double vision, changes in vision, loss of balance, or other senses being affected. The pt notes that in addition to this, she developed redness in the eyes in the past couple months, saw eye doctor and was prescribed eye drops. Denies other concerns in the last 6 months including bone pains, fatigue, fevers, chills, night sweats or unexpected weight loss. She notes that her headaches have resolved. She notes she began Decadron after her biopsy. She denies having "any symptoms  whatsoever," currently.   Of note prior to the patient's visit today, pt has had an MRI Brain completed on 07/21/18 with results revealing "Extensive dural thickening surrounding the right cerebellar hemisphere with mass-effect and edema in the right cerebellum. The process appears to extend into the upper cervical canal. Mild obstructive hydrocephalus. Differential includes tumor including metastatic disease and lymphoma. Chronic inflammatory process such as sarcoid is a consideration however chest x-ray negative. TB and atypical infection/fungus also possible. 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  On review of systems, pt reports good energy levels, headache resolution, and denies joint issues, skin rashes, dry mouth, dry eyes, fevers, chills, night sweats, bone pains, unexpected weight loss, fatigue, double vision, loss of vision, affected senses, loss of balance, and any other symptoms.   On PMHx the pt reports sickle cel trait, HTN, hyperlipidemia. On Social Hx the pt reports that she smoked 1 ppd for the last 42 years, and reports having quit two days ago. 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 On Family Hx the pt reports dad with lupus and sister with sarcoidosis. Daughter and son with sickle cell disease. Denies other blood disorders or cancers. Endorses Penicillin allergy  Interval History:   Amber Drake returns today for management and evaluation of her CNS Extranodal Marginal Zone Lymphoma.  She is here for her 5th maintenance Rituxan. The patient's last visit with [place] was on 07/22/2019. The pt reports that she is doing well overall.  The pt reports that she has received the COVID19 vaccine. She has felt well and  has no new concerns.   Lab results today (09/23/19) of CBC w/diff and CMP is as follows: all values are WNL except for PLT at 402K, Calcium at 10.4, Total Bilirubin at 0.2. 09/23/2019 LDH at 161  On review of systems, pt denies headaches, change in vision, neck pain, back pain, fevers, chills, night sweats, abdominal pain and any other symptoms.   MEDICAL HISTORY:  Past Medical History:  Diagnosis Date  . Anemia   . Arthritis   . Blood dyscrasia    sickle cell trait  . Cancer ([place])    lymphoma  . Carbuncle and furuncle   . Hypercholesterolemia   . Hypertension    does not take meds  . Sickle cell trait ([place])     SURGICAL HISTORY: Past Surgical History:  Procedure Laterality Date  . APPENDECTOMY    . APPLICATION OF CRANIAL NAVIGATION N/A 07/28/2018   Procedure: APPLICATION OF CRANIAL NAVIGATION;  Surgeon: Kary Kos, MD;  Location: [facility];  Service: Neurosurgery;  Laterality: N/A;  . BRAIN SURGERY    . CARPAL TUNNEL RELEASE  03/23/2011   Procedure: CARPAL TUNNEL RELEASE;  Surgeon: Sanjuana Kava;  Location: [facility];  Service: Orthopedics;  Laterality: Left;  . CARPAL TUNNEL RELEASE  05/10/2011   Procedure: CARPAL TUNNEL RELEASE;  Surgeon: Sanjuana Kava, MD;  Location: [facility];  Service: Orthopedics;  Laterality: Right;  . CESAREAN SECTION     x 2  . INCISION AND DRAINAGE PERIRECTAL ABSCESS  12/21/09  . PR DURAL GRAFT REPAIR,SPINE DEFECT N/A 07/28/2018   Procedure: Stereotactic open biopsy of Right cerebellar hemisphere and dura with brainlab;  Surgeon: Kary Kos, MD;  Location: [facility];  Service: Neurosurgery;  Laterality: N/A;  Stereotactic open biopsy of Right cerebellar hemisphere and dura with brainlab  . PROCTOSCOPY  10/17/2010   Procedure: PROCTOSCOPY;  Surgeon: Scherry Ran;  Location: [facility];  Service: General;  Laterality: N/A;  Rigid Proctoscopy/Possible Fistula in Ano  Procedure ended at 1003  . THERAPEUTIC ABORTION     x2    SOCIAL HISTORY: Social History    Socioeconomic History  . Marital status: Single    Spouse name: Not on file  . Number of children: Not on file  . Years of education: Not on file  . Highest education level: Not on file  Occupational History  . Not on file  Tobacco Use  . Smoking status: Current Every Day Smoker    Packs/day: 0.50    Years: 30.00    Pack years: 15.00    Types: Cigarettes  . Smokeless tobacco: Never Used  Vaping Use  . Vaping Use: Never used  Substance and Sexual Activity  . Alcohol use: No  . 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 Sexual activity: Yes    Partners: Male    Birth control/protection: None  Other Topics Concern  . Not on file  Social History Narrative  . Not on file   Social Determinants of Health   Financial Resource Strain:   . Difficulty of Paying Living Expenses:   Food Insecurity:   . Worried About Charity fundraiser in the Last Year:   . [place] in the Last Year:  Transportation Needs:   . Film/video editor (Medical):   Marland Kitchen Lack of Transportation (Non-Medical):   Physical Activity:   . Days of Exercise per Week:   . Minutes of Exercise per Session:   Stress:   . Feeling of Stress :   Social Connections:   . Frequency of Communication with Friends and Family:   . Frequency of Social Gatherings with Friends and Family:   . Attends Religious Services:   . Active Member of Clubs or Organizations:   . Attends Archivist Meetings:   Marland Kitchen Marital Status:   Intimate Partner Violence:   . Fear of Current or Ex-Partner:   . Emotionally Abused:   Marland Kitchen Physically Abused:   . Sexually Abused:     FAMILY HISTORY: Family History  Problem Relation Age of Onset  . Kidney failure Daughter   . Sickle cell anemia Daughter   . Sickle cell trait Daughter   . Sickle cell anemia Son   . Hypertension Mother   . Hyperlipidemia Mother   . Hypertension Father   . Lupus Father        skin  . Hypertension Other   . Sarcoidosis Sister   .  Anesthesia problems Neg Hx   . Hypotension Neg Hx   . Malignant hyperthermia Neg Hx   . Pseudochol deficiency Neg Hx     ALLERGIES:  is allergic to penicillins, penicillins cross reactors, and tape.  MEDICATIONS:  Current Outpatient Medications  Medication Sig Dispense Refill  . albuterol (PROAIR HFA) 108 (90 Base) MCG/ACT inhaler Inhale 2 puffs into the lungs 4 (four) times daily as needed.    Marland Kitchen atorvastatin (LIPITOR) 20 MG tablet Take 1 tablet (20 mg total) by mouth daily. 90 tablet 2  . clindamycin (CLINDAGEL) 1 % gel Apply topically 2 (two) times daily. To boils in the groin 30 g 1  . dexamethasone (DECADRON) 1 MG tablet 1mg  po daily with breakfast till 8/24 then 1mg  po every other day for 10 days then stop 30 tablet 0  . doxycycline (VIBRAMYCIN) 100 MG capsule TAKE (1) CAPSULE BY MOUTH TWICE DAILY FOR 14 DAYS. 28 capsule 0  . ferrous sulfate 325 (65 FE) MG tablet Take 325 mg by mouth daily with breakfast.    . lisinopril-hydrochlorothiazide (PRINZIDE,ZESTORETIC) 20-12.5 MG tablet Take 1 tablet by mouth daily.    . Multiple Vitamin (MULTIVITAMIN WITH MINERALS) TABS tablet Take 1 tablet by mouth daily.    . potassium chloride SA (KLOR-CON) 20 MEQ tablet Take 1 tablet (20 mEq total) by mouth 2 (two) times daily. 60 tablet 1  . predniSONE (DELTASONE) 20 MG tablet 3 po once a day for 2 days, then 2 po once a day for 3 days, then 1 po once a day for 3 days 15 tablet 0  . ranitidine (ZANTAC) 150 MG capsule Take 150 mg by mouth 2 (two) times daily.    . traMADol (ULTRAM) 50 MG tablet Take 1 tablet (50 mg total) by mouth every 6 (six) hours as needed for moderate pain or severe pain. 30 tablet 0  . Vitamins/Minerals TABS Take by mouth.    . cyclobenzaprine (FLEXERIL) 10 MG tablet Take 1 tablet (10 mg total) by mouth 3 (three) times daily as needed for muscle spasms. (Patient not taking: Reported on 05/20/2019) 40 tablet 0   No current facility-administered medications for this visit.     REVIEW OF SYSTEMS:   A 10+ POINT REVIEW OF SYSTEMS WAS OBTAINED including neurology, dermatology,  psychiatry, cardiac, respiratory, lymph, extremities, GI, GU, Musculoskeletal, constitutional, breasts, reproductive, HEENT.  All pertinent positives are noted in the HPI.  All others are negative.   PHYSICAL EXAMINATION: ECOG PERFORMANCE STATUS: 1 - Symptomatic but completely ambulatory  . Vitals:   09/23/19 1031  BP: (!) 142/66  Pulse: 67  Resp: 20  Temp: (!) 97.3 F (36.3 C)  SpO2: 100%   Filed Weights   09/23/19 1031  Weight: 173 lb 12.8 oz (78.8 kg)   .Body mass index is 26.82 kg/m.   GENERAL:alert, in no acute distress and comfortable SKIN: no acute rashes, no significant lesions EYES: conjunctiva are pink and non-injected, sclera anicteric OROPHARYNX: MMM, no exudates, no oropharyngeal erythema or ulceration NECK: supple, no JVD LYMPH:  no palpable lymphadenopathy in the cervical, axillary or inguinal regions LUNGS: clear to auscultation b/l with normal respiratory effort HEART: regular rate & rhythm ABDOMEN:  normoactive bowel sounds , non tender, not distended. No palpable hepatosplenomegaly.  Extremity: no pedal edema PSYCH: alert & oriented x 3 with fluent speech NEURO: no focal motor/sensory deficits  LABORATORY DATA:  I have reviewed the data as listed  . CBC Latest Ref Rng & Units 09/23/2019 07/22/2019 05/20/2019  WBC 4.0 - 10.5 K/uL 5.3 5.4 5.5  Hemoglobin 12.0 - 15.0 g/dL 12.3 12.7 12.5  Hematocrit 36 - 46 % 37.1 38.3 37.0  Platelets 150 - 400 K/uL 402(H) 349 396    . CMP Latest Ref Rng & Units 09/23/2019 07/22/2019 05/20/2019  Glucose 70 - 99 mg/dL 98 110(H) 97  BUN 6 - 20 mg/dL 11 15 10   Creatinine 0.44 - 1.00 mg/dL 0.80 0.82 0.75  Sodium 135 - 145 mmol/L 141 143 142  Potassium 3.5 - 5.1 mmol/L 3.5 3.2(L) 3.4(L)  Chloride 98 - 111 mmol/L 107 104 104  CO2 22 - 32 mmol/L 26 26 29   Calcium 8.9 - 10.3 mg/dL 10.4(H) 9.8 9.9  Total Protein 6.5 - 8.1  g/dL 6.9 7.0 7.1  Total Bilirubin 0.3 - 1.2 mg/dL 0.2(L) 0.3 0.3  Alkaline Phos 38 - 126 U/L 74 80 73  AST 15 - 41 U/L 16 16 17   ALT 0 - 44 U/L 16 12 16     09/12/18 BM Biopsy:    07/28/18 Right cerebellar hemisphere and dura biopsy:     RADIOGRAPHIC STUDIES: I have personally reviewed the radiological images as listed and agreed with the findings in the report. DG Chest 2 View  Result Date: 09/14/2019 CLINICAL DATA:  59 year old female with sternal pain after lifting heavy TV 3 days ago. EXAM: CHEST - 2 VIEW COMPARISON:  Chest CT 07/25/2018 and earlier. FINDINGS: PA and lateral views. Lung volumes and mediastinal contours remain normal. Visualized tracheal air column is within normal limits. The lungs appear stable and clear. No pneumothorax or pleural effusion. On the lateral view the sternal contour appears stable and within normal limits. No acute osseous abnormality identified. Negative visible bowel gas pattern. IMPRESSION: No acute cardiopulmonary abnormality or recent injury identified. Electronically Signed   By: Genevie Ann M.D.   On: 09/14/2019 11:40    ASSESSMENT & PLAN:  59 y.o. female with  1. 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 The process appears to extend into the upper cervical canal. Mild obstructive hydrocephalus. Differential includes tumor including metastatic disease and lymphoma. Chronic inflammatory process such as sarcoid is a consideration however chest x-ray negative. TB and atypical infection/fungus also possible. 6 mm  colloid cyst felt to be a separate problem."  07/25/18 CT C/A/P which did not reveal significant abnormality  09/12/18 BM Bx revealed no evidence of lymphoma  09/08/18 PET/CT revealed "No hypermetabolic mass or adenopathy identified within the chest abdomen or pelvis to suggest metabolically active tumor. 2. Nonspecific focus of increased uptake within the cord extending from T12 to L1. Cannot rule out additional site of CNS lymphoma. Consider further evaluation with contrast enhanced MRI through this area. 3. Aortic Atherosclerosis and Emphysema."  10/16/2018 MRI brain w and w/o contrast revealed "1. Resolved dural mass in the right posterior fossa with minimal smooth dural thickening that may be treatment related. Cerebellar edema and mass effect is also resolved. No new site of disease. 2. 6 mm colloid cyst."  10/20/2018 MRI cervical spine w and w/o contrast revealed "Negative for lymphoma. No acute abnormality. Central disc protrusion at C5-6 effaces the ventral thecal sac causing mild central canal narrowing. There is also a shallow disc bulge at C6-7 which narrows but does not efface the ventral thecal Sac."  01/13/2019 MRI Brain ([id number]) revealed "1. Stable and satisfactory post treatment appearance of the posterior fossa. 2. Stable small 6 mm colloid cyst. 3. No new intracranial abnormality."  05/06/2019 MRI Cervical Spine ([id number]) revealed "No evidence of lymphoma in the cervical spine. Previously noted dural enhancement in the posterior fossa and cervical canal has resolved. No cord compression or cord lesion. Chronic cervical spine degenerative changes are stable from the prior study."  PLAN: -Discussed pt labwork today, 09/23/19; blood counts and chemistries look good, LDH is WNL -No lab or clinical evidence of CNS Extranodal Marginal Zone Lymphoma progression at this time -Pt has no prohibitive toxicities from continuing maintenance Rituxan at this time -Will continue  maintenance Rituxan every two months, up to two years -Will rpt MRI Brain in 7 weeks  -Will see back in 2 months with labs     FOLLOW UP: MRI Brain w/wo contrast in 7 weeks F/u for next cycle of maintenance Rituxan in 2 months with labs and MD visit   The total time spent in the appt was 30 minutes and more than 50% was on counseling and direct patient cares, ordering and mx of Rituxan.  All of the patient's questions were answered with apparent satisfaction. The patient knows to call the clinic with any problems, questions or concerns.    Sullivan Lone MD [place] AAHIVMS [facility] Hematology/Oncology Physician [facility]  (Office):       [phone] (Work cell):  [phone] (Fax):           [phone]  09/23/2019 11:35 AM  I, Yevette Edwards, am acting as a scribe for Dr. Sullivan Lone.   .I have reviewed the above documentation for accuracy and completeness, and I agree with the above. Brunetta Genera MD

## 2019-09-23 ENCOUNTER — Inpatient Hospital Stay (HOSPITAL_BASED_OUTPATIENT_CLINIC_OR_DEPARTMENT_OTHER): Payer: Medicaid Other | Admitting: Hematology

## 2019-09-23 ENCOUNTER — Other Ambulatory Visit: Payer: Self-pay

## 2019-09-23 ENCOUNTER — Inpatient Hospital Stay: Payer: Medicaid Other

## 2019-09-23 ENCOUNTER — Inpatient Hospital Stay: Payer: Medicaid Other | Attending: Internal Medicine

## 2019-09-23 VITALS — BP 114/75 | HR 67 | Temp 98.3°F | Resp 18

## 2019-09-23 VITALS — BP 142/66 | HR 67 | Temp 97.3°F | Resp 20 | Ht 67.5 in | Wt 173.8 lb

## 2019-09-23 DIAGNOSIS — D573 Sickle-cell trait: Secondary | ICD-10-CM | POA: Insufficient documentation

## 2019-09-23 DIAGNOSIS — I10 Essential (primary) hypertension: Secondary | ICD-10-CM | POA: Insufficient documentation

## 2019-09-23 DIAGNOSIS — F17210 Nicotine dependence, cigarettes, uncomplicated: Secondary | ICD-10-CM | POA: Diagnosis not present

## 2019-09-23 DIAGNOSIS — Z5112 Encounter for antineoplastic immunotherapy: Secondary | ICD-10-CM | POA: Diagnosis not present

## 2019-09-23 DIAGNOSIS — M1990 Unspecified osteoarthritis, unspecified site: Secondary | ICD-10-CM | POA: Insufficient documentation

## 2019-09-23 DIAGNOSIS — C884 Extranodal marginal zone B-cell lymphoma of mucosa-associated lymphoid tissue [MALT-lymphoma]: Secondary | ICD-10-CM | POA: Diagnosis not present

## 2019-09-23 DIAGNOSIS — E7800 Pure hypercholesterolemia, unspecified: Secondary | ICD-10-CM | POA: Insufficient documentation

## 2019-09-23 DIAGNOSIS — Z7952 Long term (current) use of systemic steroids: Secondary | ICD-10-CM | POA: Diagnosis not present

## 2019-09-23 DIAGNOSIS — Z79899 Other long term (current) drug therapy: Secondary | ICD-10-CM | POA: Diagnosis not present

## 2019-09-23 DIAGNOSIS — Z7189 Other specified counseling: Secondary | ICD-10-CM

## 2019-09-23 LAB — CBC WITH DIFFERENTIAL/PLATELET
Abs Immature Granulocytes: 0.01 10*3/uL (ref 0.00–0.07)
Basophils Absolute: 0 10*3/uL (ref 0.0–0.1)
Basophils Relative: 0 %
Eosinophils Absolute: 0.1 10*3/uL (ref 0.0–0.5)
Eosinophils Relative: 1 %
HCT: 37.1 % (ref 36.0–46.0)
Hemoglobin: 12.3 g/dL (ref 12.0–15.0)
Immature Granulocytes: 0 %
Lymphocytes Relative: 32 %
Lymphs Abs: 1.7 10*3/uL (ref 0.7–4.0)
MCH: 28.5 pg (ref 26.0–34.0)
MCHC: 33.2 g/dL (ref 30.0–36.0)
MCV: 85.9 fL (ref 80.0–100.0)
Monocytes Absolute: 0.5 10*3/uL (ref 0.1–1.0)
Monocytes Relative: 9 %
Neutro Abs: 3.1 10*3/uL (ref 1.7–7.7)
Neutrophils Relative %: 58 %
Platelets: 402 10*3/uL — ABNORMAL HIGH (ref 150–400)
RBC: 4.32 MIL/uL (ref 3.87–5.11)
RDW: 12.4 % (ref 11.5–15.5)
WBC: 5.3 10*3/uL (ref 4.0–10.5)
nRBC: 0 % (ref 0.0–0.2)

## 2019-09-23 LAB — CMP (CANCER CENTER ONLY)
ALT: 16 U/L (ref 0–44)
AST: 16 U/L (ref 15–41)
Albumin: 3.7 g/dL (ref 3.5–5.0)
Alkaline Phosphatase: 74 U/L (ref 38–126)
Anion gap: 8 (ref 5–15)
BUN: 11 mg/dL (ref 6–20)
CO2: 26 mmol/L (ref 22–32)
Calcium: 10.4 mg/dL — ABNORMAL HIGH (ref 8.9–10.3)
Chloride: 107 mmol/L (ref 98–111)
Creatinine: 0.8 mg/dL (ref 0.44–1.00)
GFR, Est AFR Am: >60 mL/min (ref >60)
GFR, Estimated: >60 mL/min (ref >60)
Glucose, Bld: 98 mg/dL (ref 70–99)
Potassium: 3.5 mmol/L (ref 3.5–5.1)
Sodium: 141 mmol/L (ref 135–145)
Total Bilirubin: 0.2 mg/dL — ABNORMAL LOW (ref 0.3–1.2)
Total Protein: 6.9 g/dL (ref 6.5–8.1)

## 2019-09-23 LAB — LACTATE DEHYDROGENASE: LDH: 161 U/L (ref 98–192)

## 2019-09-23 MED ORDER — DIPHENHYDRAMINE HCL 25 MG PO CAPS
ORAL_CAPSULE | ORAL | Status: AC
  Filled 2019-09-23: qty 2

## 2019-09-23 MED ORDER — DIPHENHYDRAMINE HCL 25 MG PO CAPS
50.0000 mg | ORAL_CAPSULE | Freq: Once | ORAL | Status: AC
  Administered 2019-09-23: 50 mg via ORAL

## 2019-09-23 MED ORDER — SODIUM CHLORIDE 0.9 % IV SOLN
10.0000 mg | Freq: Once | INTRAVENOUS | Status: AC
  Administered 2019-09-23: 10 mg via INTRAVENOUS
  Filled 2019-09-23: qty 10

## 2019-09-23 MED ORDER — FAMOTIDINE IN NACL 20-0.9 MG/50ML-% IV SOLN
INTRAVENOUS | Status: AC
  Filled 2019-09-23: qty 50

## 2019-09-23 MED ORDER — SODIUM CHLORIDE 0.9 % IV SOLN
Freq: Once | INTRAVENOUS | Status: AC
  Filled 2019-09-23: qty 250

## 2019-09-23 MED ORDER — SODIUM CHLORIDE 0.9 % IV SOLN
375.0000 mg/m2 | Freq: Once | INTRAVENOUS | Status: AC
  Administered 2019-09-23: 700 mg via INTRAVENOUS
  Filled 2019-09-23: qty 20

## 2019-09-23 MED ORDER — ACETAMINOPHEN 325 MG PO TABS
650.0000 mg | ORAL_TABLET | Freq: Once | ORAL | Status: AC
  Administered 2019-09-23: 650 mg via ORAL

## 2019-09-23 MED ORDER — ACETAMINOPHEN 325 MG PO TABS
ORAL_TABLET | ORAL | Status: AC
  Filled 2019-09-23: qty 2

## 2019-09-23 MED ORDER — FAMOTIDINE IN NACL 20-0.9 MG/50ML-% IV SOLN
20.0000 mg | Freq: Once | INTRAVENOUS | Status: AC
  Administered 2019-09-23: 20 mg via INTRAVENOUS

## 2019-09-23 NOTE — Patient Instructions (Signed)
[facility] Discharge Instructions for Patients Receiving Chemotherapy  Today you received the following chemotherapy agents:  Rituxan   To help prevent nausea and vomiting after your treatment, we encourage you to take your nausea medication as prescribed.   If you develop nausea and vomiting that is not controlled by your nausea medication, call the clinic.   BELOW ARE SYMPTOMS THAT SHOULD BE REPORTED IMMEDIATELY:  *FEVER GREATER THAN 100.5 F  *CHILLS WITH OR WITHOUT FEVER  NAUSEA AND VOMITING THAT IS NOT CONTROLLED WITH YOUR NAUSEA MEDICATION  *UNUSUAL SHORTNESS OF BREATH  *UNUSUAL BRUISING OR BLEEDING  TENDERNESS IN MOUTH AND THROAT WITH OR WITHOUT PRESENCE OF ULCERS  *URINARY PROBLEMS  *BOWEL PROBLEMS  UNUSUAL RASH Items with * indicate a potential emergency and should be followed up as soon as possible.  Feel free to call the clinic should you have any questions or concerns. The clinic phone number is [phone].  Please show the CHEMO ALERT CARD at check-in to the Emergency Department and triage nurse.   

## 2019-10-13 ENCOUNTER — Encounter (HOSPITAL_COMMUNITY): Payer: Self-pay | Admitting: *Deleted

## 2019-10-13 ENCOUNTER — Other Ambulatory Visit: Payer: Self-pay

## 2019-10-13 ENCOUNTER — Emergency Department (HOSPITAL_COMMUNITY)
Admission: EM | Admit: 2019-10-13 | Discharge: 2019-10-13 | Disposition: A | Discharge status: Home / Self Care | Payer: Medicaid Other | Attending: Emergency Medicine | Admitting: Emergency Medicine

## 2019-10-13 ENCOUNTER — Emergency Department (HOSPITAL_COMMUNITY): Payer: Medicaid Other

## 2019-10-13 DIAGNOSIS — M5440 Lumbago with sciatica, unspecified side: Secondary | ICD-10-CM

## 2019-10-13 DIAGNOSIS — M5441 Lumbago with sciatica, right side: Secondary | ICD-10-CM | POA: Diagnosis not present

## 2019-10-13 DIAGNOSIS — F17210 Nicotine dependence, cigarettes, uncomplicated: Secondary | ICD-10-CM | POA: Diagnosis not present

## 2019-10-13 DIAGNOSIS — Z79899 Other long term (current) drug therapy: Secondary | ICD-10-CM | POA: Insufficient documentation

## 2019-10-13 DIAGNOSIS — I10 Essential (primary) hypertension: Secondary | ICD-10-CM | POA: Diagnosis not present

## 2019-10-13 DIAGNOSIS — M545 Low back pain: Secondary | ICD-10-CM | POA: Diagnosis present

## 2019-10-13 MED ORDER — KETOROLAC TROMETHAMINE 60 MG/2ML IM SOLN
60.0000 mg | Freq: Once | INTRAMUSCULAR | Status: AC
  Administered 2019-10-13: 60 mg via INTRAMUSCULAR
  Filled 2019-10-13: qty 2

## 2019-10-13 MED ORDER — IBUPROFEN 800 MG PO TABS
800.0000 mg | ORAL_TABLET | Freq: Three times a day (TID) | ORAL | 1 refills | Status: DC | PRN
Start: 2019-10-13 — End: 2021-06-19

## 2019-10-13 NOTE — ED Triage Notes (Signed)
Pt c/o lower back pain for the last couple of days; pt denies any obvious injury

## 2019-10-13 NOTE — Discharge Instructions (Addendum)
Follow up with your md next week if not improving °

## 2019-10-13 NOTE — ED Provider Notes (Signed)
[facility] EMERGENCY DEPARTMENT Provider Note   CSN: [id number] Arrival date & time: 10/13/19  1740     History Chief Complaint  Patient presents with  . Back Pain    Amber Drake is a 59 y.o. female.  Patient complains of lower back pain.  Patient states she has recently done some lifting  The history is provided by the patient and medical records. No language interpreter was used.  Back Pain Location:  Lumbar spine Quality:  Aching Radiates to:  Does not radiate Pain severity:  Moderate Pain is:  Same all the time Timing:  Constant Progression:  Worsening Chronicity:  New Context: not emotional stress   Associated symptoms: no abdominal pain, no chest pain and no headaches        Past Medical History:  Diagnosis Date  . Anemia   . Arthritis   . Blood dyscrasia    sickle cell trait  . Cancer ([place])    lymphoma  . Carbuncle and furuncle   . Hypercholesterolemia   . Hypertension    does not take meds  . Sickle cell trait [facility])     Patient Active Problem List   Diagnosis Date Noted  . 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 Counseling regarding advance care planning and goals of care 10/23/2018  . 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 Brain tumor ([place]) 07/28/2018  . Hidradenitis 07/31/2016  . Recurrent boils 07/31/2016  . Hyperlipidemia 06/15/2015  . Pain of left hand 05/19/2015  . Hand pain, right 05/19/2015  . Hematuria 03/03/2015  . Esophageal reflux 03/03/2015  . Essential hypertension, benign 03/03/2015  . Bronchitis due to tobacco use 10/17/2010    Past Surgical History:  Procedure Laterality Date  . APPENDECTOMY    . APPLICATION OF CRANIAL NAVIGATION N/A 07/28/2018   Procedure: APPLICATION OF CRANIAL NAVIGATION;  Surgeon: Kary Kos, MD;  Location: [place];  Service: Neurosurgery;  Laterality: N/A;  . BRAIN SURGERY    . CARPAL TUNNEL RELEASE  03/23/2011   Procedure: CARPAL TUNNEL RELEASE;  Surgeon: Sanjuana Kava;  [facility];  Service: Orthopedics;  Laterality: Left;  . CARPAL TUNNEL RELEASE  05/10/2011   Procedure: CARPAL TUNNEL RELEASE;  Surgeon: Sanjuana Kava, MD;  Location: [facility];  Service: Orthopedics;  Laterality: Right;  . CESAREAN SECTION     x 2  . INCISION AND DRAINAGE PERIRECTAL ABSCESS  12/21/09  . PR DURAL GRAFT REPAIR,SPINE DEFECT N/A 07/28/2018   Procedure: Stereotactic open biopsy of Right cerebellar hemisphere and dura with brainlab;  Surgeon: Kary Kos, MD;  Location: [place];  Service: Neurosurgery;  Laterality: N/A;  Stereotactic open biopsy of Right cerebellar hemisphere and dura with brainlab  . PROCTOSCOPY  10/17/2010   Procedure: PROCTOSCOPY;  Surgeon: Scherry Ran;  Location: [facility];  Service: General;  Laterality: N/A;  Rigid Proctoscopy/Possible Fistula in Ano  Procedure ended at 1003  . THERAPEUTIC ABORTION     x2     OB History    Gravida  4   Para  2   Term  2   Preterm      AB  2   Living  2     SAB      TAB      Ectopic      Multiple      Live Births  2           Family History  Problem Relation Age of Onset  .  Kidney failure Daughter   . Sickle cell anemia Daughter   . Sickle cell trait Daughter   . Sickle cell anemia Son   . Hypertension Mother   . Hyperlipidemia Mother   . Hypertension Father   . Lupus Father        skin  . Hypertension Other   . Sarcoidosis Sister   . Anesthesia problems Neg Hx   . Hypotension Neg Hx   . Malignant hyperthermia Neg Hx   . Pseudochol deficiency Neg Hx     Social History   Tobacco Use  . Smoking status: Current Every Day Smoker    Packs/day: 0.50    Years: 30.00    Pack years: 15.00    Types: Cigarettes  . Smokeless tobacco: Never Used  Vaping Use  . Vaping Use: Never used  Substance Use Topics  . Alcohol use: No  . 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  Authorizing Provider  atorvastatin (LIPITOR) 20 MG tablet Take 1 tablet (20 mg total) by mouth daily. 03/14/15  Yes Soyla Dryer, PA-C  clindamycin (CLINDAGEL) 1 % gel Apply topically 2 (two) times daily. To boils in the groin 07/22/19  Yes Brunetta Genera, MD  lisinopril-hydrochlorothiazide (PRINZIDE,ZESTORETIC) 20-12.5 MG tablet Take 1 tablet by mouth daily.   Yes [provider]  Multiple Vitamin (MULTIVITAMIN WITH MINERALS) TABS tablet Take 1 tablet by mouth daily.   Yes [provider]  albuterol (PROAIR HFA) 108 (90 Base) MCG/ACT inhaler Inhale 2 puffs into the lungs 4 (four) times daily as needed. Patient not taking: Reported on 10/13/2019 02/25/17   [provider]  cyclobenzaprine (FLEXERIL) 10 MG tablet Take 1 tablet (10 mg total) by mouth 3 (three) times daily as needed for muscle spasms. Patient not taking: Reported on 05/20/2019 11/07/16   Sanjuana Kava, MD  dexamethasone (DECADRON) 1 MG tablet 1mg  po daily with breakfast till 8/24 then 1mg  po every other day for 10 days then stop Patient not taking: Reported on 10/13/2019 11/17/18   Brunetta Genera, MD  doxycycline (VIBRAMYCIN) 100 MG capsule TAKE (1) CAPSULE BY MOUTH TWICE DAILY FOR 14 DAYS. Patient not taking: Reported on 10/13/2019 04/21/19   Jonnie Kind, MD  ferrous sulfate 325 (65 FE) MG tablet Take 325 mg by mouth daily with breakfast. Patient not taking: Reported on 10/13/2019    [provider]  ibuprofen (ADVIL) 800 MG tablet Take 1 tablet (800 mg total) by mouth every 8 (eight) hours as needed. 10/13/19   Milton Ferguson, MD  potassium chloride SA (KLOR-CON) 20 MEQ tablet Take 1 tablet (20 mEq total) by mouth 2 (two) times daily. Patient not taking: Reported on 10/13/2019 01/21/19   Brunetta Genera, MD  predniSONE (DELTASONE) 20 MG tablet 3 po once a day for 2 days, then 2 po once a day for 3 days, then 1 po once a day for 3 days Patient not taking: Reported on 10/13/2019  08/11/19   Lajean Saver, MD  ranitidine (ZANTAC) 150 MG capsule Take 150 mg by mouth 2 (two) times daily. Patient not taking: Reported on 10/13/2019    [provider]  traMADol (ULTRAM) 50 MG tablet Take 1 tablet (50 mg total) by mouth every 6 (six) hours as needed for moderate pain or severe pain. Patient not taking: Reported on 10/13/2019 08/10/19   Brunetta Genera,  MD  Vitamins/Minerals TABS Take by mouth. Patient not taking: Reported on 10/13/2019    [provider]    Allergies    Penicillins, Penicillins cross reactors, and Tape  Review of Systems   Review of Systems  Constitutional: Negative for appetite change and fatigue.  HENT: Negative for congestion, ear discharge and sinus pressure.   Eyes: Negative for discharge.  Respiratory: Negative for cough.   Cardiovascular: Negative for chest pain.  Gastrointestinal: Negative for abdominal pain and diarrhea.  Genitourinary: Negative for frequency and hematuria.  Musculoskeletal: Positive for back pain.  Skin: Negative for rash.  Neurological: Negative for seizures and headaches.  Psychiatric/Behavioral: Negative for hallucinations.    Physical Exam Updated Vital Signs BP 138/69 (BP Location: Right Arm)   Pulse 77   Temp 98.4 F (36.9 C) (Oral)   Resp 18   Ht 5\' 4"  (1.626 m)   Wt 78 kg   LMP 08/12/2010   SpO2 100%   BMI 29.52 kg/m   Physical Exam Vitals and nursing note reviewed.  Constitutional:      Appearance: She is well-developed.  HENT:     Head: Normocephalic.     Nose: Nose normal.  Eyes:     General: No scleral icterus.    Conjunctiva/sclera: Conjunctivae normal.  Neck:     Thyroid: No thyromegaly.  Cardiovascular:     Rate and Rhythm: Normal rate and regular rhythm.     Heart sounds: No murmur heard.  No friction rub. No gallop.   Pulmonary:     Breath sounds: No stridor. No wheezing or rales.  Chest:     Chest wall: No tenderness.  Abdominal:     General: There is no  distension.     Tenderness: There is no abdominal tenderness. There is no rebound.  Musculoskeletal:        General: Normal range of motion.     Cervical back: Neck supple.     Comments: Tender lumbar spine  Lymphadenopathy:     Cervical: No cervical adenopathy.  Skin:    Findings: No erythema or rash.  Neurological:     Mental Status: She is alert and oriented to person, place, and time.     Motor: No abnormal muscle tone.     Coordination: Coordination normal.  Psychiatric:        Behavior: Behavior normal.     ED Results / Procedures / Treatments   Labs (all labs ordered are listed, but only abnormal results are displayed) Labs Reviewed - No data to display  EKG None  Radiology DG Lumbar Spine Complete  Result Date: 10/13/2019 CLINICAL DATA:  Pain EXAM: LUMBAR SPINE - COMPLETE 4+ VIEW COMPARISON:  None. FINDINGS: Normal alignment. Disc spaces maintained. No fracture. Mild degenerative facet disease in the lower lumbar spine. SI joints symmetric and unremarkable. IMPRESSION: Mild degenerative facet disease in the lower lumbar spine. No acute bony abnormality. Electronically Signed   By: Rolm Baptise M.D.   On: 10/13/2019 09:01    Procedures Procedures (including critical care time)  Medications Ordered in ED Medications  ketorolac (TORADOL) injection 60 mg (60 mg Intramuscular Given 10/13/19 1017)    ED Course  I have reviewed the triage vital signs and the nursing notes.  Pertinent labs & imaging results that were available during my care of the patient were reviewed by me and considered in my medical decision making (see chart for details).    MDM Rules/Calculators/A&P  X-rays of the lower back showed some degenerative changes.  Patient with lumbar strain she will be placed on Motrin follow-up with PCP        This patient presents to the ED for concern of back pain, this involves an extensive number of treatment options, and is a  complaint that carries with it a high risk of complications and morbidity.  The differential diagnosis includes musculoskeletal pain urinary tract infection   Lab Tests:   Medicines ordered:   I ordered medication Toradol for pain  Imaging Studies ordered:   I ordered imaging studies which included lumbar spine series and  I independently visualized and interpreted imaging which showed degenerative changes  Additional history obtained:   Additional history obtained from records  Previous records obtained and reviewed.  Consultations Obtained:    Reevaluation:  After the interventions stated above, I reevaluated the patient and found improved  Critical Interventions:  .   Final Clinical Impression(s) / ED Diagnoses Final diagnoses:  Acute right-sided low back pain with sciatica, sciatica laterality unspecified    Rx / DC Orders ED Discharge Orders         Ordered    ibuprofen (ADVIL) 800 MG tablet  Every 8 hours PRN     Discontinue  Reprint     10/13/19 1014           Milton Ferguson, MD 10/14/19 [phone]

## 2019-10-22 ENCOUNTER — Encounter (HOSPITAL_COMMUNITY): Payer: Self-pay

## 2019-10-22 ENCOUNTER — Other Ambulatory Visit: Payer: Self-pay

## 2019-10-22 ENCOUNTER — Emergency Department (HOSPITAL_COMMUNITY)
Admission: EM | Admit: 2019-10-22 | Discharge: 2019-10-22 | Disposition: A | Discharge status: Home / Self Care | Payer: Medicaid Other | Attending: Emergency Medicine | Admitting: Emergency Medicine

## 2019-10-22 DIAGNOSIS — K5900 Constipation, unspecified: Secondary | ICD-10-CM

## 2019-10-22 DIAGNOSIS — Z8572 Personal history of non-Hodgkin lymphomas: Secondary | ICD-10-CM | POA: Diagnosis not present

## 2019-10-22 DIAGNOSIS — I10 Essential (primary) hypertension: Secondary | ICD-10-CM | POA: Diagnosis not present

## 2019-10-22 DIAGNOSIS — F17210 Nicotine dependence, cigarettes, uncomplicated: Secondary | ICD-10-CM | POA: Diagnosis not present

## 2019-10-22 DIAGNOSIS — R590 Localized enlarged lymph nodes: Secondary | ICD-10-CM | POA: Diagnosis not present

## 2019-10-22 DIAGNOSIS — Z7951 Long term (current) use of inhaled steroids: Secondary | ICD-10-CM | POA: Diagnosis not present

## 2019-10-22 DIAGNOSIS — Z79899 Other long term (current) drug therapy: Secondary | ICD-10-CM | POA: Insufficient documentation

## 2019-10-22 MED ORDER — CEPHALEXIN 500 MG PO CAPS
500.0000 mg | ORAL_CAPSULE | Freq: Three times a day (TID) | ORAL | 0 refills | Status: DC
Start: 2019-10-22 — End: 2020-05-31

## 2019-10-22 NOTE — Discharge Instructions (Signed)
Begin taking Keflex as prescribed.  Try using magnesium citrate: Take two thirds of the 10 ounce bottle mixed with equal parts Sprite or Gatorade for relief of constipation.  Return to the ER symptoms significantly worsen or change.

## 2019-10-22 NOTE — ED Triage Notes (Signed)
Pt reports constipation, lbm was 2 days ago.  Also reports a knot to left side of face x 3 days.

## 2019-10-23 NOTE — ED Provider Notes (Signed)
[facility] Provider Note   CSN: [id number] Arrival date & time: 10/22/19  0732     History Chief Complaint  Patient presents with  . Constipation    Amber Drake is a 59 y.o. female.  Patient is a 59 year old female with history of hypertension, hyperlipidemia, sickle cell trait.  She presents today for evaluation of a knot to the left side of her face and constipation.  She denies any fevers or chills.  She denies any bloody stool or vomit.  Her last bowel movement was 2 days ago.  She has tried nothing for her symptoms.  The history is provided by the patient.       Past Medical History:  Diagnosis Date  . Anemia   . Arthritis   . Blood dyscrasia    sickle cell trait  . Cancer ([place])    lymphoma  . Carbuncle and furuncle   . Hypercholesterolemia   . Hypertension    does not take meds  . Sickle cell trait [facility])     Patient Active Problem List   Diagnosis Date Noted  . 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 Counseling regarding advance care planning and goals of care 10/23/2018  . Diffuse large B cell lymphoma ([place]) 08/11/2018  . Brain tumor ([place]) 07/28/2018  . Hidradenitis 07/31/2016  . Recurrent boils 07/31/2016  . Hyperlipidemia 06/15/2015  . Pain of left hand 05/19/2015  . Hand pain, right 05/19/2015  . Hematuria 03/03/2015  . Esophageal reflux 03/03/2015  . Essential hypertension, benign 03/03/2015  . Bronchitis due to tobacco use 10/17/2010    Past Surgical History:  Procedure Laterality Date  . APPENDECTOMY    . APPLICATION OF CRANIAL NAVIGATION N/A 07/28/2018   Procedure: APPLICATION OF CRANIAL NAVIGATION;  Surgeon: Kary Kos, MD;  Location: [place];  Service: Neurosurgery;  Laterality: N/A;  . BRAIN SURGERY    . CARPAL TUNNEL RELEASE  03/23/2011   Procedure: CARPAL TUNNEL RELEASE;  Surgeon: Sanjuana Kava;  Location: [facility];  Service: Orthopedics;  Laterality: Left;  . CARPAL TUNNEL RELEASE  05/10/2011    Procedure: CARPAL TUNNEL RELEASE;  Surgeon: Sanjuana Kava, MD;  Location: [facility];  Service: Orthopedics;  Laterality: Right;  . CESAREAN SECTION     x 2  . INCISION AND DRAINAGE PERIRECTAL ABSCESS  12/21/09  . PR DURAL GRAFT REPAIR,SPINE DEFECT N/A 07/28/2018   Procedure: Stereotactic open biopsy of Right cerebellar hemisphere and dura with brainlab;  Surgeon: Kary Kos, MD;  Location: [place];  Service: Neurosurgery;  Laterality: N/A;  Stereotactic open biopsy of Right cerebellar hemisphere and dura with brainlab  . PROCTOSCOPY  10/17/2010   Procedure: PROCTOSCOPY;  Surgeon: Scherry Ran;  Location: [facility];  Service: General;  Laterality: N/A;  Rigid Proctoscopy/Possible Fistula in Ano  Procedure ended at 1003  . THERAPEUTIC ABORTION     x2     OB History    Gravida  4   Para  2   Term  2   Preterm      AB  2   Living  2     SAB      TAB      Ectopic      Multiple      Live Births  2           Family History  Problem Relation Age of Onset  . Kidney failure Daughter   . Sickle cell anemia Daughter   . Sickle cell  trait Daughter   . Sickle cell anemia Son   . Hypertension Mother   . Hyperlipidemia Mother   . Hypertension Father   . Lupus Father        skin  . Hypertension Other   . Sarcoidosis Sister   . Anesthesia problems Neg Hx   . Hypotension Neg Hx   . Malignant hyperthermia Neg Hx   . Pseudochol deficiency Neg Hx     Social History   Tobacco Use  . Smoking status: Current Every Day Smoker    Packs/day: 0.50    Years: 30.00    Pack years: 15.00    Types: Cigarettes  . Smokeless tobacco: Never Used  Vaping Use  . Vaping Use: Never used  Substance Use Topics  . Alcohol use: No  . 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 Authorizing Provider  albuterol (PROAIR HFA) 108 (90 Base) MCG/ACT inhaler Inhale 2 puffs into the lungs 4 (four)  times daily as needed. Patient not taking: Reported on 10/13/2019 02/25/17   [provider]  atorvastatin (LIPITOR) 20 MG tablet Take 1 tablet (20 mg total) by mouth daily. 03/14/15   Soyla Dryer, PA-C  cephALEXin (KEFLEX) 500 MG capsule Take 1 capsule (500 mg total) by mouth 3 (three) times daily. 10/22/19   Veryl Speak, MD  clindamycin (CLINDAGEL) 1 % gel Apply topically 2 (two) times daily. To boils in the groin 07/22/19   Brunetta Genera, MD  cyclobenzaprine (FLEXERIL) 10 MG tablet Take 1 tablet (10 mg total) by mouth 3 (three) times daily as needed for muscle spasms. Patient not taking: Reported on 05/20/2019 11/07/16   Sanjuana Kava, MD  dexamethasone (DECADRON) 1 MG tablet 1mg  po daily with breakfast till 8/24 then 1mg  po every other day for 10 days then stop Patient not taking: Reported on 10/13/2019 11/17/18   Brunetta Genera, MD  doxycycline (VIBRAMYCIN) 100 MG capsule TAKE (1) CAPSULE BY MOUTH TWICE DAILY FOR 14 DAYS. Patient not taking: Reported on 10/13/2019 04/21/19   Jonnie Kind, MD  ferrous sulfate 325 (65 FE) MG tablet Take 325 mg by mouth daily with breakfast. Patient not taking: Reported on 10/13/2019    [provider]  ibuprofen (ADVIL) 800 MG tablet Take 1 tablet (800 mg total) by mouth every 8 (eight) hours as needed. 10/13/19   Milton Ferguson, MD  lisinopril-hydrochlorothiazide (PRINZIDE,ZESTORETIC) 20-12.5 MG tablet Take 1 tablet by mouth daily.    [provider]  Multiple Vitamin (MULTIVITAMIN WITH MINERALS) TABS tablet Take 1 tablet by mouth daily.    [provider]  potassium chloride SA (KLOR-CON) 20 MEQ tablet Take 1 tablet (20 mEq total) by mouth 2 (two) times daily. Patient not taking: Reported on 10/13/2019 01/21/19   Brunetta Genera, MD  predniSONE (DELTASONE) 20 MG tablet 3 po once a day for 2 days, then 2 po once a day for 3 days, then 1 po once a day for 3 days Patient not taking: Reported on 10/13/2019  08/11/19   Lajean Saver, MD  ranitidine (ZANTAC) 150 MG capsule Take 150 mg by mouth 2 (two) times daily. Patient not taking: Reported on 10/13/2019    [provider]  traMADol (ULTRAM) 50 MG tablet Take 1 tablet (50 mg total) by mouth every 6 (six) hours as needed for moderate pain or severe pain. Patient not taking:  Reported on 10/13/2019 08/10/19   Brunetta Genera, MD  Vitamins/Minerals TABS Take by mouth. Patient not taking: Reported on 10/13/2019    [provider]    Allergies    Penicillins, Penicillins cross reactors, and Tape  Review of Systems   Review of Systems  All other systems reviewed and are negative.   Physical Exam Updated Vital Signs BP 126/68 (BP Location: Right Arm)   Pulse 73   Temp 97.6 F (36.4 C) (Oral)   Resp 16   Ht 5\' 4"  (1.626 m)   Wt 78 kg   LMP 08/12/2010   SpO2 99%   BMI 29.52 kg/m   Physical Exam Vitals and nursing note reviewed.  Constitutional:      General: She is not in acute distress.    Appearance: She is well-developed. She is not diaphoretic.  HENT:     Head: Normocephalic and atraumatic.     Comments: There is a small, less than 1 cm, nodule noted anterior to the left ear, I suspect representing a reactive lymph node. Cardiovascular:     Rate and Rhythm: Normal rate and regular rhythm.     Heart sounds: No murmur heard.  No friction rub. No gallop.   Pulmonary:     Effort: Pulmonary effort is normal. No respiratory distress.     Breath sounds: Normal breath sounds. No wheezing.  Abdominal:     General: Bowel sounds are normal. There is no distension.     Palpations: Abdomen is soft.     Tenderness: There is no abdominal tenderness.  Musculoskeletal:        General: Normal range of motion.     Cervical back: Normal range of motion and neck supple.  Skin:    General: Skin is warm and dry.  Neurological:     Mental Status: She is alert and oriented to person, place, and time.     ED Results /  Procedures / Treatments   Labs (all labs ordered are listed, but only abnormal results are displayed) Labs Reviewed - No data to display  EKG None  Radiology No results found.  Procedures Procedures (including critical care time)  Medications Ordered in ED Medications - No data to display  ED Course  I have reviewed the triage vital signs and the nursing notes.  Pertinent labs & imaging results that were available during my care of the patient were reviewed by me and considered in my medical decision making (see chart for details).    MDM Rules/Calculators/A&P  Patient will be treated for a reactive lymph node with Keflex.  I will also recommend mag citrate to take for relief of constipation.  She is to follow-up if she worsens.  Final Clinical Impression(s) / ED Diagnoses Final diagnoses:  Lymphadenopathy, preauricular  Constipation, unspecified constipation type    Rx / DC Orders ED Discharge Orders         Ordered    cephALEXin (KEFLEX) 500 MG capsule  3 times daily     Discontinue  Reprint     10/22/19 0712           Veryl Speak, MD 10/23/19 [phone]

## 2019-11-04 ENCOUNTER — Encounter (HOSPITAL_COMMUNITY): Payer: Self-pay | Admitting: Emergency Medicine

## 2019-11-04 ENCOUNTER — Other Ambulatory Visit: Payer: Self-pay

## 2019-11-04 ENCOUNTER — Emergency Department (HOSPITAL_COMMUNITY)
Admission: EM | Admit: 2019-11-04 | Discharge: 2019-11-04 | Disposition: A | Discharge status: Home / Self Care | Payer: Medicaid Other | Attending: Emergency Medicine | Admitting: Emergency Medicine

## 2019-11-04 DIAGNOSIS — Z79899 Other long term (current) drug therapy: Secondary | ICD-10-CM | POA: Diagnosis not present

## 2019-11-04 DIAGNOSIS — R590 Localized enlarged lymph nodes: Secondary | ICD-10-CM

## 2019-11-04 DIAGNOSIS — Z85841 Personal history of malignant neoplasm of brain: Secondary | ICD-10-CM | POA: Insufficient documentation

## 2019-11-04 DIAGNOSIS — I10 Essential (primary) hypertension: Secondary | ICD-10-CM | POA: Diagnosis not present

## 2019-11-04 DIAGNOSIS — F17210 Nicotine dependence, cigarettes, uncomplicated: Secondary | ICD-10-CM | POA: Insufficient documentation

## 2019-11-04 DIAGNOSIS — H1031 Unspecified acute conjunctivitis, right eye: Secondary | ICD-10-CM

## 2019-11-04 MED ORDER — HYDROCODONE-ACETAMINOPHEN 5-325 MG PO TABS: ORAL_TABLET | ORAL | 0 refills | Status: DC

## 2019-11-04 MED ORDER — TOBRAMYCIN 0.3 % OP SOLN
2.0000 [drp] | OPHTHALMIC | Status: DC
  Administered 2019-11-04: 2 [drp] via OPHTHALMIC
  Filled 2019-11-04: qty 5

## 2019-11-04 MED ORDER — DOXYCYCLINE HYCLATE 100 MG PO CAPS
100.0000 mg | ORAL_CAPSULE | Freq: Two times a day (BID) | ORAL | 0 refills | Status: DC
Start: 2019-11-04 — End: 2020-05-31

## 2019-11-04 NOTE — Discharge Instructions (Addendum)
Apply warm wet compresses on and off to your left face.  Apply 2 drops of the tobramycin to the right eye 4 times a day along with warm compresses when possible.  Use the drops for at least 7 days.  Follow-up with your primary care provider for recheck.

## 2019-11-04 NOTE — ED Triage Notes (Signed)
Patient states her eye is red and has been draining x 2 days

## 2019-11-04 NOTE — ED Provider Notes (Signed)
[facility] EMERGENCY DEPARTMENT Provider Note   CSN: [id number] Arrival date & time: 11/04/19  1478     History No chief complaint on file.   Amber Drake is a 59 y.o. female.  HPI      Amber Drake is a 59 y.o. female with past medical history of hypertension, sickle cell trait, and B-cell lymphoma follows with oncology, who presents to the Emergency Department complaining of persistent pain and swelling of the left face.  She noticed a "painful bump" in front of her left ear.  Symptoms have been present for 1 week.  She was prescribed an antibiotic which she believes was Keflex.  She completed a course of antibiotics 2 days ago without relief of symptoms.  She reports pain to this area with palpation and with opening and closing her mouth.  She does endorse some recent left-sided dental work and soreness of her left upper gums.  No other pain or swelling of her face.  She also complains of itching redness and drainage of her right eye x2 days.  She states that she wakes with crusting and discharge from both corners of her right eye.  She denies swelling of her eye or visual changes.  No difficulty swallowing, fever, chills, pain or swelling of her neck, headache, nausea or vomiting.  She has taken Tylenol without relief of her pain.    Past Medical History:  Diagnosis Date  . Anemia   . Arthritis   . Blood dyscrasia    sickle cell trait  . Cancer ([place])    lymphoma  . Carbuncle and furuncle   . Hypercholesterolemia   . Hypertension    does not take meds  . Sickle cell trait [facility])     Patient Active Problem List   Diagnosis Date Noted  . 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 Counseling regarding advance care planning and goals of care 10/23/2018  . 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 Brain tumor ([place]) 07/28/2018  . Hidradenitis 07/31/2016  . Recurrent boils 07/31/2016  . Hyperlipidemia 06/15/2015  . Pain of left hand 05/19/2015    . Hand pain, right 05/19/2015  . Hematuria 03/03/2015  . Esophageal reflux 03/03/2015  . Essential hypertension, benign 03/03/2015  . Bronchitis due to tobacco use 10/17/2010    Past Surgical History:  Procedure Laterality Date  . APPENDECTOMY    . APPLICATION OF CRANIAL NAVIGATION N/A 07/28/2018   Procedure: APPLICATION OF CRANIAL NAVIGATION;  Surgeon: Kary Kos, MD;  Location: [facility];  Service: Neurosurgery;  Laterality: N/A;  . BRAIN SURGERY    . CARPAL TUNNEL RELEASE  03/23/2011   Procedure: CARPAL TUNNEL RELEASE;  Surgeon: Sanjuana Kava;  Location: [facility];  Service: Orthopedics;  Laterality: Left;  . CARPAL TUNNEL RELEASE  05/10/2011   Procedure: CARPAL TUNNEL RELEASE;  Surgeon: Sanjuana Kava, MD;  Location: [facility];  Service: Orthopedics;  Laterality: Right;  . CESAREAN SECTION     x 2  . INCISION AND DRAINAGE PERIRECTAL ABSCESS  12/21/09  . PR DURAL GRAFT REPAIR,SPINE DEFECT N/A 07/28/2018   Procedure: Stereotactic open biopsy of Right cerebellar hemisphere and dura with brainlab;  Surgeon: Kary Kos, MD;  Location: [place];  Service: Neurosurgery;  Laterality: N/A;  Stereotactic open biopsy of Right cerebellar hemisphere and dura with brainlab  . PROCTOSCOPY  10/17/2010   Procedure: PROCTOSCOPY;  Surgeon: Scherry Ran;  Location: [facility];  Service: General;  Laterality: N/A;  Rigid Proctoscopy/Possible Fistula in  Ano  Procedure ended at 1003  . THERAPEUTIC ABORTION     x2     OB History    Gravida  4   Para  2   Term  2   Preterm      AB  2   Living  2     SAB      TAB      Ectopic      Multiple      Live Births  2           Family History  Problem Relation Age of Onset  . Kidney failure Daughter   . Sickle cell anemia Daughter   . Sickle cell trait Daughter   . Sickle cell anemia Son   . Hypertension Mother   . Hyperlipidemia Mother   . Hypertension Father   . Lupus Father        skin  . Hypertension Other   . Sarcoidosis Sister   .  Anesthesia problems Neg Hx   . Hypotension Neg Hx   . Malignant hyperthermia Neg Hx   . Pseudochol deficiency Neg Hx     Social History   Tobacco Use  . Smoking status: Current Every Day Smoker    Packs/day: 0.50    Years: 30.00    Pack years: 15.00    Types: Cigarettes  . Smokeless tobacco: Never Used  Vaping Use  . Vaping Use: Never used  Substance Use Topics  . Alcohol use: No  . 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 Authorizing Provider  albuterol (PROAIR HFA) 108 (90 Base) MCG/ACT inhaler Inhale 2 puffs into the lungs 4 (four) times daily as needed. Patient not taking: Reported on 10/13/2019 02/25/17   [provider]  atorvastatin (LIPITOR) 20 MG tablet Take 1 tablet (20 mg total) by mouth daily. 03/14/15   Soyla Dryer, PA-C  cephALEXin (KEFLEX) 500 MG capsule Take 1 capsule (500 mg total) by mouth 3 (three) times daily. 10/22/19   Veryl Speak, MD  clindamycin (CLINDAGEL) 1 % gel Apply topically 2 (two) times daily. To boils in the groin 07/22/19   Brunetta Genera, MD  cyclobenzaprine (FLEXERIL) 10 MG tablet Take 1 tablet (10 mg total) by mouth 3 (three) times daily as needed for muscle spasms. Patient not taking: Reported on 05/20/2019 11/07/16   Sanjuana Kava, MD  dexamethasone (DECADRON) 1 MG tablet 1mg  po daily with breakfast till 8/24 then 1mg  po every other day for 10 days then stop Patient not taking: Reported on 10/13/2019 11/17/18   Brunetta Genera, MD  doxycycline (VIBRAMYCIN) 100 MG capsule TAKE (1) CAPSULE BY MOUTH TWICE DAILY FOR 14 DAYS. Patient not taking: Reported on 10/13/2019 04/21/19   Jonnie Kind, MD  ferrous sulfate 325 (65 FE) MG tablet Take 325 mg by mouth daily with breakfast. Patient not taking: Reported on 10/13/2019    [provider]  ibuprofen (ADVIL) 800 MG tablet Take 1 tablet (800 mg total) by mouth every 8  (eight) hours as needed. 10/13/19   Milton Ferguson, MD  lisinopril-hydrochlorothiazide (PRINZIDE,ZESTORETIC) 20-12.5 MG tablet Take 1 tablet by mouth daily.    [provider]  Multiple Vitamin (MULTIVITAMIN WITH MINERALS) TABS tablet Take 1 tablet by mouth daily.    [provider]  potassium chloride SA (KLOR-CON) 20 MEQ tablet Take 1 tablet (20 mEq  total) by mouth 2 (two) times daily. Patient not taking: Reported on 10/13/2019 01/21/19   Brunetta Genera, MD  predniSONE (DELTASONE) 20 MG tablet 3 po once a day for 2 days, then 2 po once a day for 3 days, then 1 po once a day for 3 days Patient not taking: Reported on 10/13/2019 08/11/19   Lajean Saver, MD  ranitidine (ZANTAC) 150 MG capsule Take 150 mg by mouth 2 (two) times daily. Patient not taking: Reported on 10/13/2019    [provider]  traMADol (ULTRAM) 50 MG tablet Take 1 tablet (50 mg total) by mouth every 6 (six) hours as needed for moderate pain or severe pain. Patient not taking: Reported on 10/13/2019 08/10/19   Brunetta Genera, MD  Vitamins/Minerals TABS Take by mouth. Patient not taking: Reported on 10/13/2019    [provider]    Allergies    Penicillins, Penicillins cross reactors, and Tape  Review of Systems   Review of Systems  Constitutional: Negative for activity change, appetite change, chills and fever.  HENT: Positive for dental problem and facial swelling. Negative for ear discharge, ear pain, sore throat and trouble swallowing.   Eyes: Positive for pain, discharge and itching. Negative for visual disturbance.  Respiratory: Negative for cough, chest tightness and shortness of breath.   Cardiovascular: Negative for chest pain.  Gastrointestinal: Negative for abdominal pain, nausea and vomiting.  Genitourinary: Negative for dysuria.  Musculoskeletal: Negative for neck pain and neck stiffness.  Skin: Negative for rash and wound.  Neurological: Negative for dizziness,  weakness and numbness.    Physical Exam Updated Vital Signs BP 134/87 (BP Location: Right Arm)   Pulse (!) 54   Temp 98.1 F (36.7 C) (Oral)   Resp 18   Ht 5\' 4"  (1.626 m)   Wt 79.4 kg   LMP 08/12/2010   SpO2 100%   BMI 30.04 kg/m   Physical Exam Vitals and nursing note reviewed.  Constitutional:      General: She is not in acute distress.    Appearance: Normal appearance. She is not ill-appearing.  HENT:     Right Ear: Tympanic membrane and ear canal normal.     Left Ear: Tympanic membrane and ear canal normal.     Nose: Nose normal.     Mouth/Throat:     Mouth: Mucous membranes are moist.     Comments: Mild erythema of the left upper gingiva.  Patient is edentulous.  Uvula midline and nonedematous.  No erythema or edema of the oropharynx. Eyes:     General: Lids are normal. Vision grossly intact. Gaze aligned appropriately.        Right eye: Discharge present. No foreign body or hordeolum.        Left eye: No discharge.     Extraocular Movements: Extraocular movements intact.     Conjunctiva/sclera:     Right eye: Right conjunctiva is not injected. Exudate present. No chemosis.    Left eye: Left conjunctiva is not injected. No chemosis or exudate. Cardiovascular:     Rate and Rhythm: Normal rate and regular rhythm.     Pulses: Normal pulses.  Pulmonary:     Effort: Pulmonary effort is normal.     Breath sounds: Normal breath sounds.  Musculoskeletal:        General: Normal range of motion.     Cervical back: Normal range of motion. No rigidity.  Lymphadenopathy:     Head:     Left side  of head: Preauricular adenopathy present.     Cervical: No cervical adenopathy.     Right cervical: No superficial or posterior cervical adenopathy.    Left cervical: No superficial or posterior cervical adenopathy.     Comments: Palpable left preauricular node, tender to palpation.  No erythema or fluctuance.  Skin:    General: Skin is warm.     Findings: No rash.    Neurological:     General: No focal deficit present.     Mental Status: She is alert.     Sensory: No sensory deficit.     Motor: No weakness.     ED Results / Procedures / Treatments   Labs (all labs ordered are listed, but only abnormal results are displayed) Labs Reviewed - No data to display  EKG None  Radiology No results found.  Procedures Procedures (including critical care time)  Medications Ordered in ED Medications  tobramycin (TOBREX) 0.3 % ophthalmic solution 2 drop (has no administration in time range)    ED Course  I have reviewed the triage vital signs and the nursing notes.  Pertinent labs & imaging results that were available during my care of the patient were reviewed by me and considered in my medical decision making (see chart for details).    MDM Rules/Calculators/A&P                            Visual Acuity  Right Eye Distance: 20 Left Eye Distance: 20 Bilateral Distance: 20  Right Eye Near: R Near: 20 Left Eye Near:  L Near: 20 Bilateral Near:  20    Patient here with palpable, slightly enlarged left preauricular node.  She does have some tenderness and erythema of the left upper gums.  This may be the source of her lymphadenopathy.  Left TM is normal-appearing.  No palpable cervical lymphadenopathy.  Patient is well-appearing.  Afebrile.  She also has some mild exudates of the right eye consistent with conjunctivitis.  No visual or neurological deficits.  No meningeal signs.  No headache.  Patient agrees to treatment plan with warm compresses to the right eye, tobramycin drops dispensed.  Will prescribe doxycycline, she agrees to close outpatient follow-up if not improving.   Final Clinical Impression(s) / ED Diagnoses Final diagnoses:  Lymphadenopathy, periauricular  Acute conjunctivitis of right eye, unspecified acute conjunctivitis type    Rx / DC Orders ED Discharge Orders    None       Kem Parkinson, PA-C 11/04/19  1006    [name], MD 11/05/19 1017

## 2019-11-04 NOTE — ED Triage Notes (Signed)
Patient states she also has a knot on the left side of her face.  States she was seen her and given an antibiotic and knot is still there.

## 2019-11-24 NOTE — Progress Notes (Signed)
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 female who has been referred to [place] by my colleague Dr. Cecil Cobbs in Neuro-Oncology for evaluation and management of her Newly diagnosed Extranodal Marginal Zone Lymphoma. She is accompanied today by her sister [name] via [employer]. The pt reports that she is doing well overall.  Prior to today's visit, the pt presented to the ED on 07/21/18 with complaints of a headache for the past month which presented intermittently and was worsened by bending, coughing, or laughing. She had a CT Head and MRI Brain which revealed extensive dural thickening, as noted below. She then had a right posterior fossa craniectomy on 07/28/18 for open exicisional biopsies of the right cerebellar hemisphere and dura which revealed Extranodal marginal zone lymphoma. The pt then established care with my colleague Dr. Cecil Cobbs in Neuro-Onc on 08/11/18. She has been taking 4mg  Decadron daily.  The pt reports that developed worsened headaches about a month ago, and notes that when she laughed it felt like "electiricty" was running through the sides and back of her head. She notes that some mild headaches had begun a couple months ago. Denies double vision, changes in vision, loss of balance, or other senses being affected. The pt notes that in addition to this, she developed redness in the eyes in the past couple months, saw eye doctor and was prescribed eye drops. Denies other concerns in the last 6 months including bone pains, fatigue, fevers, chills, night sweats or unexpected weight loss. She notes that her headaches have resolved. She notes she began Decadron after her biopsy. She denies having "any symptoms  whatsoever," currently.   Of note prior to the patient's visit today, pt has had an MRI Brain completed on 07/21/18 with results revealing "Extensive dural thickening surrounding the right cerebellar hemisphere with mass-effect and edema in the right cerebellum. The process appears to extend into the upper cervical canal. Mild obstructive hydrocephalus. Differential includes tumor including metastatic disease and lymphoma. Chronic inflammatory process such as sarcoid is a consideration however chest x-ray negative. TB and atypical infection/fungus also possible. 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  On review of systems, pt reports good energy levels, headache resolution, and denies joint issues, skin rashes, dry mouth, dry eyes, fevers, chills, night sweats, bone pains, unexpected weight loss, fatigue, double vision, loss of vision, affected senses, loss of balance, and any other symptoms.   On PMHx the pt reports sickle cel trait, HTN, hyperlipidemia. On Social Hx the pt reports that she smoked 1 ppd for the last 42 years, and reports having quit two days ago. 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 On Family Hx the pt reports dad with lupus and sister with sarcoidosis. Daughter and son with sickle cell disease. Denies other blood disorders or cancers. Endorses Penicillin allergy  Interval History:  Amber Drake returns today for management and evaluation of her CNS Extranodal Marginal Zone Lymphoma. She  is here for her 6th maintenance Rituxan. The patient's last visit with [place] was on 09/23/2019. The pt reports that she is doing well overall.  The pt reports that she had some leg pain, but attributes this to muscle soreness  after working 12 hours at her new job. Pt went to the ER for a bump on the left side of her face, near her upper jaw. Pt is unsure how long the lump has been present. It is not causing her any discomfort at this time. She has her first appointment with Dr. Wende Neighbors on 12/01/19 to establish primary care. She is no longer following with Dr. Legrand Rams. Her Brain MRI is scheduled for 12/12/19.   Lab results today (11/25/19) of CBC w/diff and CMP is as follows: all values are WNL except for Hgb at 11.8, HCT at 34.7, Potassium at 3.1, Glucose at 117, Calcium at 10.6. 11/25/2019 LDH at 201  On review of systems, pt reports leg pain and denies back pain, headaches, vision changes, fevers, chills, night sweats, leg swelling, throat pain and any other symptoms.   MEDICAL HISTORY:  Past Medical History:  Diagnosis Date  . Anemia   . Arthritis   . Blood dyscrasia    sickle cell trait  . Cancer ([place])    lymphoma  . Carbuncle and furuncle   . Hypercholesterolemia   . Hypertension    does not take meds  . Sickle cell trait ([place])     SURGICAL HISTORY: Past Surgical History:  Procedure Laterality Date  . APPENDECTOMY    . APPLICATION OF CRANIAL NAVIGATION N/A 07/28/2018   Procedure: APPLICATION OF CRANIAL NAVIGATION;  Surgeon: Kary Kos, MD;  Location: [facility];  Service: Neurosurgery;  Laterality: N/A;  . BRAIN SURGERY    . CARPAL TUNNEL RELEASE  03/23/2011   Procedure: CARPAL TUNNEL RELEASE;  Surgeon: Sanjuana Kava;  Location: [facility];  Service: Orthopedics;  Laterality: Left;  . CARPAL TUNNEL RELEASE  05/10/2011   Procedure: CARPAL TUNNEL RELEASE;  Surgeon: Sanjuana Kava, MD;  Location: [facility];  Service: Orthopedics;  Laterality: Right;  . CESAREAN SECTION     x 2  . INCISION AND DRAINAGE PERIRECTAL ABSCESS  12/21/09  . PR DURAL GRAFT REPAIR,SPINE DEFECT N/A 07/28/2018   Procedure: Stereotactic open biopsy of Right cerebellar hemisphere and dura with brainlab;  Surgeon: Kary Kos, MD;  Location: [facility];   Service: Neurosurgery;  Laterality: N/A;  Stereotactic open biopsy of Right cerebellar hemisphere and dura with brainlab  . PROCTOSCOPY  10/17/2010   Procedure: PROCTOSCOPY;  Surgeon: Scherry Ran;  Location: [facility];  Service: General;  Laterality: N/A;  Rigid Proctoscopy/Possible Fistula in Ano  Procedure ended at 1003  . THERAPEUTIC ABORTION     x2    SOCIAL HISTORY: Social History   Socioeconomic History  . Marital status: Married    Spouse name: Not on file  . Number of children: Not on file  . Years of education: Not on file  . Highest education level: Not on file  Occupational History  . Not on file  Tobacco Use  . Smoking status: Current Every Day Smoker    Packs/day: 0.50    Years: 30.00    Pack years: 15.00    Types: Cigarettes  . Smokeless tobacco: Never Used  Vaping Use  . Vaping Use: Never used  Substance and Sexual Activity  . Alcohol use: No  . 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  Sexual activity: Yes    Partners: Male    Birth control/protection: None  Other Topics Concern  . Not on file  Social History Narrative  . Not on file   Social Determinants of Health   Financial Resource Strain:   . Difficulty of Paying Living Expenses: Not on file  Food Insecurity:   . Worried About Charity fundraiser in the Last Year: Not on file  . Ran Out of Food in the Last Year: Not on file  Transportation Needs:   . Lack of Transportation (Medical): Not on file  . Lack of Transportation (Non-Medical): Not on file  Physical Activity:   . Days of Exercise per Week: Not on file  . Minutes of Exercise per Session: Not on file  Stress:   . Feeling of Stress : Not on file  Social Connections:   . Frequency of Communication with Friends and Family: Not on file  . Frequency of Social Gatherings with Friends and Family: Not on file  . Attends Religious Services: Not on file  . Active Member of Clubs or Organizations: Not on file  . Attends Theatre manager Meetings: Not on file  . Marital Status: Not on file  Intimate Partner Violence:   . Fear of Current or Ex-Partner: Not on file  . Emotionally Abused: Not on file  . Physically Abused: Not on file  . Sexually Abused: Not on file    FAMILY HISTORY: Family History  Problem Relation Age of Onset  . Kidney failure Daughter   . Sickle cell anemia Daughter   . Sickle cell trait Daughter   . Sickle cell anemia Son   . Hypertension Mother   . Hyperlipidemia Mother   . Hypertension Father   . Lupus Father        skin  . Hypertension Other   . Sarcoidosis Sister   . Anesthesia problems Neg Hx   . Hypotension Neg Hx   . Malignant hyperthermia Neg Hx   . Pseudochol deficiency Neg Hx     ALLERGIES:  is allergic to penicillins, penicillins cross reactors, and tape.  MEDICATIONS:  Current Outpatient Medications  Medication Sig Dispense Refill  . albuterol (PROAIR HFA) 108 (90 Base) MCG/ACT inhaler Inhale 2 puffs into the lungs 4 (four) times daily as needed. (Patient not taking: Reported on 10/13/2019)    . atorvastatin (LIPITOR) 20 MG tablet Take 1 tablet (20 mg total) by mouth daily. 90 tablet 2  . cephALEXin (KEFLEX) 500 MG capsule Take 1 capsule (500 mg total) by mouth 3 (three) times daily. 21 capsule 0  . clindamycin (CLINDAGEL) 1 % gel Apply topically 2 (two) times daily. To boils in the groin 30 g 1  . cyclobenzaprine (FLEXERIL) 10 MG tablet Take 1 tablet (10 mg total) by mouth 3 (three) times daily as needed for muscle spasms. (Patient not taking: Reported on 05/20/2019) 40 tablet 0  . dexamethasone (DECADRON) 1 MG tablet 1mg  po daily with breakfast till 8/24 then 1mg  po every other day for 10 days then stop (Patient not taking: Reported on 10/13/2019) 30 tablet 0  . doxycycline (VIBRAMYCIN) 100 MG capsule Take 1 capsule (100 mg total) by mouth 2 (two) times daily. 20 capsule 0  . ferrous sulfate 325 (65 FE) MG tablet Take 325 mg by mouth daily with breakfast. (Patient  not taking: Reported on 10/13/2019)    . HYDROcodone-acetaminophen (NORCO/VICODIN) 5-325 MG tablet Take one tab po q 4 hrs prn pain 8 tablet  0  . ibuprofen (ADVIL) 800 MG tablet Take 1 tablet (800 mg total) by mouth every 8 (eight) hours as needed. 20 tablet 1  . lisinopril-hydrochlorothiazide (PRINZIDE,ZESTORETIC) 20-12.5 MG tablet Take 1 tablet by mouth daily.    . Multiple Vitamin (MULTIVITAMIN WITH MINERALS) TABS tablet Take 1 tablet by mouth daily.    . potassium chloride SA (KLOR-CON) 20 MEQ tablet Take 1 tablet (20 mEq total) by mouth 2 (two) times daily. (Patient not taking: Reported on 10/13/2019) 60 tablet 1  . predniSONE (DELTASONE) 20 MG tablet 3 po once a day for 2 days, then 2 po once a day for 3 days, then 1 po once a day for 3 days (Patient not taking: Reported on 10/13/2019) 15 tablet 0  . ranitidine (ZANTAC) 150 MG capsule Take 150 mg by mouth 2 (two) times daily. (Patient not taking: Reported on 10/13/2019)    . traMADol (ULTRAM) 50 MG tablet Take 1 tablet (50 mg total) by mouth every 6 (six) hours as needed for moderate pain or severe pain. (Patient not taking: Reported on 10/13/2019) 30 tablet 0  . Vitamins/Minerals TABS Take by mouth. (Patient not taking: Reported on 10/13/2019)     No current facility-administered medications for this visit.    REVIEW OF SYSTEMS:   A 10+ POINT REVIEW OF SYSTEMS WAS OBTAINED including neurology, dermatology, psychiatry, cardiac, respiratory, lymph, extremities, GI, GU, Musculoskeletal, constitutional, breasts, reproductive, HEENT.  All pertinent positives are noted in the HPI.  All others are negative.   PHYSICAL EXAMINATION: ECOG PERFORMANCE STATUS: 1 - Symptomatic but completely ambulatory  . Vitals:   11/25/19 1131  BP: (!) 145/86  Pulse: 80  Resp: 18  Temp: (!) 96.8 F (36 C)  SpO2: 100%   Filed Weights   11/25/19 1131  Weight: 177 lb (80.3 kg)   .Body mass index is 30.38 kg/m.   GENERAL:alert, in no acute distress and  comfortable SKIN: no acute rashes, no significant lesions EYES: conjunctiva are pink and non-injected, sclera anicteric OROPHARYNX: MMM, no exudates, no oropharyngeal erythema or ulceration NECK: supple, no JVD LYMPH:  no palpable lymphadenopathy in the cervical, axillary or inguinal regions LUNGS: clear to auscultation b/l with normal respiratory effort HEART: regular rate & rhythm ABDOMEN:  normoactive bowel sounds , non tender, not distended. No palpable hepatosplenomegaly.  Extremity: no pedal edema PSYCH: alert & oriented x 3 with fluent speech NEURO: no focal motor/sensory deficits  LABORATORY DATA:  I have reviewed the data as listed  . CBC Latest Ref Rng & Units 11/25/2019 09/23/2019 07/22/2019  WBC 4.0 - 10.5 K/uL 6.4 5.3 5.4  Hemoglobin 12.0 - 15.0 g/dL 11.8(L) 12.3 12.7  Hematocrit 36 - 46 % 34.7(L) 37.1 38.3  Platelets 150 - 400 K/uL 364 402(H) 349    . CMP Latest Ref Rng & Units 11/25/2019 09/23/2019 07/22/2019  Glucose 70 - 99 mg/dL 117(H) 98 110(H)  BUN 6 - 20 mg/dL 16 11 15   Creatinine 0.44 - 1.00 mg/dL 0.82 0.80 0.82  Sodium 135 - 145 mmol/L 142 141 143  Potassium 3.5 - 5.1 mmol/L 3.1(L) 3.5 3.2(L)  Chloride 98 - 111 mmol/L 107 107 104  CO2 22 - 32 mmol/L 27 26 26   Calcium 8.9 - 10.3 mg/dL 10.6(H) 10.4(H) 9.8  Total Protein 6.5 - 8.1 g/dL 6.9 6.9 7.0  Total Bilirubin 0.3 - 1.2 mg/dL 0.3 0.2(L) 0.3  Alkaline Phos 38 - 126 U/L 77 74 80  AST 15 - 41 U/L 19 16 16   ALT  0 - 44 U/L 20 16 12     09/12/18 BM Biopsy:    07/28/18 Right cerebellar hemisphere and dura biopsy:     RADIOGRAPHIC STUDIES: I have personally reviewed the radiological images as listed and agreed with the findings in the report. No results found.  ASSESSMENT & PLAN:  59 y.o. female with  1. Meningeal Extranodal Marginal Zone Lymphoma involving meninges in posterior fossa Labs upon initial presentation from 07/28/18, HGB stable at 11.1, WBC higher at 14.2k, PLT normal and stable at  374k 11/18/17 HIV Antibody non-reactive  07/28/18 Right cerebellar hemisphere and dura biopsy which revealed Extranodal Marginal Zone Lymphoma  07/21/18 MRI Brain revealed "Extensive dural thickening surrounding the right cerebellar hemisphere with mass-effect and edema in the right cerebellum. The process appears to extend into the upper cervical canal. Mild obstructive hydrocephalus. Differential includes tumor including metastatic disease and lymphoma. Chronic inflammatory process such as sarcoid is a consideration however chest x-ray negative. TB and atypical infection/fungus also possible. 6 mm colloid cyst felt to be a separate problem."  07/25/18 CT C/A/P which did not reveal significant abnormality  09/12/18 BM Bx revealed no evidence of lymphoma  09/08/18 PET/CT revealed "No hypermetabolic mass or adenopathy identified within the chest abdomen or pelvis to suggest metabolically active tumor. 2. Nonspecific focus of increased uptake within the cord extending from T12 to L1. Cannot rule out additional site of CNS lymphoma. Consider further evaluation with contrast enhanced MRI through this area. 3. Aortic Atherosclerosis and Emphysema."  10/16/2018 MRI brain w and w/o contrast revealed "1. Resolved dural mass in the right posterior fossa with minimal smooth dural thickening that may be treatment related. Cerebellar edema and mass effect is also resolved. No new site of disease. 2. 6 mm colloid cyst."  10/20/2018 MRI cervical spine w and w/o contrast revealed "Negative for lymphoma. No acute abnormality. Central disc protrusion at C5-6 effaces the ventral thecal sac causing mild central canal narrowing. There is also a shallow disc bulge at C6-7 which narrows but does not efface the ventral thecal Sac."  01/13/2019 MRI Brain ([id number]) revealed "1. Stable and satisfactory post treatment appearance of the posterior fossa. 2. Stable small 6 mm colloid cyst. 3. No new intracranial  abnormality."  05/06/2019 MRI Cervical Spine ([id number]) revealed "No evidence of lymphoma in the cervical spine. Previously noted dural enhancement in the posterior fossa and cervical canal has resolved. No cord compression or cord lesion. Chronic cervical spine degenerative changes are stable from the prior study."  PLAN: -Discussed pt labwork today, 11/25/19; blood counts look good, hypokalemia & hypercalcemia, LDH is borderline elevated. -No lab or clinical evidence of CNS Extranodal Marginal Zone Lymphoma progression at this time. -Pt has no prohibitive toxicities from continuing maintenance Rituxan at this time. -Will continue maintenance Rituxan every two months for up to three years. -Advised pt that her hypercalcemia could be caused by dehydration - she is currently taking a diuretic  -Advised pt that her potassium will need to be monitored because of her diuretics. -Advised pt that low potassium can cause muscle cramps. -Recommend pt discuss ongoing potassium replacement with PCP.  -Advised pt that the left-sided face nodule appears to be a possible small nodule or a part of TM joint - will monitor -Recommended that the pt continue to eat well, drink at least 48-64 oz of water each day, and walk 20-30 minutes each day.  -Recommend OTC Tylenol for pain management. Would prefer she avoid Ibuprofen due to concerns for kidney damage.  -  Recommend pt f/u for MRI Brain as scheduled. Will discuss results via phone in 3 weeks.  -Recommend pt f/u with Dr. Nevada Crane as scheduled -Rx Potassium -Will see back in 2 months with labs    FOLLOW UP: F/u for MRI brain as scheduled on 9/11 Phone visit with Dr Irene Limbo in 3 weeks to discuss MRI brain results Plz schedule next 2 doses of maintenance Rituxan q52months with labs and MD visits   The total time spent in the appt was 30 minutes and more than 50% was on counseling and direct patient cares.ordering and mx of Rituxan  All of the patient's questions  were answered with apparent satisfaction. The patient knows to call the clinic with any problems, questions or concerns.    Sullivan Lone MD [place] AAHIVMS [facility] Hematology/Oncology Physician [facility]  (Office):       [phone] (Work cell):  [phone] (Fax):           [phone]  11/25/2019 11:53 AM  I, Yevette Edwards, am acting as a scribe for Dr. Sullivan Lone.   .I have reviewed the above documentation for accuracy and completeness, and I agree with the above. Brunetta Genera MD

## 2019-11-25 ENCOUNTER — Other Ambulatory Visit: Payer: Self-pay | Admitting: Hematology

## 2019-11-25 ENCOUNTER — Other Ambulatory Visit: Payer: Self-pay

## 2019-11-25 ENCOUNTER — Inpatient Hospital Stay: Payer: Medicaid Other

## 2019-11-25 ENCOUNTER — Inpatient Hospital Stay: Payer: Medicaid Other | Attending: Internal Medicine

## 2019-11-25 ENCOUNTER — Inpatient Hospital Stay (HOSPITAL_BASED_OUTPATIENT_CLINIC_OR_DEPARTMENT_OTHER): Payer: Medicaid Other | Admitting: Hematology

## 2019-11-25 VITALS — BP 145/86 | HR 80 | Temp 96.8°F | Resp 18 | Ht 64.0 in | Wt 177.0 lb

## 2019-11-25 DIAGNOSIS — Z79899 Other long term (current) drug therapy: Secondary | ICD-10-CM | POA: Diagnosis not present

## 2019-11-25 DIAGNOSIS — E876 Hypokalemia: Secondary | ICD-10-CM | POA: Diagnosis not present

## 2019-11-25 DIAGNOSIS — C884 Extranodal marginal zone B-cell lymphoma of mucosa-associated lymphoid tissue [MALT-lymphoma]: Secondary | ICD-10-CM

## 2019-11-25 DIAGNOSIS — I10 Essential (primary) hypertension: Secondary | ICD-10-CM | POA: Insufficient documentation

## 2019-11-25 DIAGNOSIS — E785 Hyperlipidemia, unspecified: Secondary | ICD-10-CM | POA: Insufficient documentation

## 2019-11-25 DIAGNOSIS — M1990 Unspecified osteoarthritis, unspecified site: Secondary | ICD-10-CM | POA: Diagnosis not present

## 2019-11-25 DIAGNOSIS — F17210 Nicotine dependence, cigarettes, uncomplicated: Secondary | ICD-10-CM | POA: Insufficient documentation

## 2019-11-25 DIAGNOSIS — Z5112 Encounter for antineoplastic immunotherapy: Secondary | ICD-10-CM

## 2019-11-25 DIAGNOSIS — D573 Sickle-cell trait: Secondary | ICD-10-CM | POA: Diagnosis not present

## 2019-11-25 LAB — CMP (CANCER CENTER ONLY)
ALT: 20 U/L (ref 0–44)
AST: 19 U/L (ref 15–41)
Albumin: 3.8 g/dL (ref 3.5–5.0)
Alkaline Phosphatase: 77 U/L (ref 38–126)
Anion gap: 8 (ref 5–15)
BUN: 16 mg/dL (ref 6–20)
CO2: 27 mmol/L (ref 22–32)
Calcium: 10.6 mg/dL — ABNORMAL HIGH (ref 8.9–10.3)
Chloride: 107 mmol/L (ref 98–111)
Creatinine: 0.82 mg/dL (ref 0.44–1.00)
GFR, Est AFR Am: >60 mL/min (ref >60)
GFR, Estimated: >60 mL/min (ref >60)
Glucose, Bld: 117 mg/dL — ABNORMAL HIGH (ref 70–99)
Potassium: 3.1 mmol/L — ABNORMAL LOW (ref 3.5–5.1)
Sodium: 142 mmol/L (ref 135–145)
Total Bilirubin: 0.3 mg/dL (ref 0.3–1.2)
Total Protein: 6.9 g/dL (ref 6.5–8.1)

## 2019-11-25 LAB — CBC WITH DIFFERENTIAL/PLATELET
Abs Immature Granulocytes: 0.01 10*3/uL (ref 0.00–0.07)
Basophils Absolute: 0 10*3/uL (ref 0.0–0.1)
Basophils Relative: 1 %
Eosinophils Absolute: 0.1 10*3/uL (ref 0.0–0.5)
Eosinophils Relative: 2 %
HCT: 34.7 % — ABNORMAL LOW (ref 36.0–46.0)
Hemoglobin: 11.8 g/dL — ABNORMAL LOW (ref 12.0–15.0)
Immature Granulocytes: 0 %
Lymphocytes Relative: 25 %
Lymphs Abs: 1.6 10*3/uL (ref 0.7–4.0)
MCH: 28.5 pg (ref 26.0–34.0)
MCHC: 34 g/dL (ref 30.0–36.0)
MCV: 83.8 fL (ref 80.0–100.0)
Monocytes Absolute: 0.5 10*3/uL (ref 0.1–1.0)
Monocytes Relative: 8 %
Neutro Abs: 4.1 10*3/uL (ref 1.7–7.7)
Neutrophils Relative %: 64 %
Platelets: 364 10*3/uL (ref 150–400)
RBC: 4.14 MIL/uL (ref 3.87–5.11)
RDW: 12.2 % (ref 11.5–15.5)
WBC: 6.4 10*3/uL (ref 4.0–10.5)
nRBC: 0 % (ref 0.0–0.2)

## 2019-11-25 LAB — LACTATE DEHYDROGENASE: LDH: 201 U/L — ABNORMAL HIGH (ref 98–192)

## 2019-11-25 MED ORDER — FAMOTIDINE IN NACL 20-0.9 MG/50ML-% IV SOLN
INTRAVENOUS | Status: AC
  Filled 2019-11-25: qty 50

## 2019-11-25 MED ORDER — SODIUM CHLORIDE 0.9 % IV SOLN
Freq: Once | INTRAVENOUS | Status: AC
  Filled 2019-11-25: qty 250

## 2019-11-25 MED ORDER — ACETAMINOPHEN 325 MG PO TABS
650.0000 mg | ORAL_TABLET | Freq: Once | ORAL | Status: AC
  Administered 2019-11-25: 650 mg via ORAL

## 2019-11-25 MED ORDER — DIPHENHYDRAMINE HCL 25 MG PO CAPS
50.0000 mg | ORAL_CAPSULE | Freq: Once | ORAL | Status: AC
  Administered 2019-11-25: 50 mg via ORAL

## 2019-11-25 MED ORDER — SODIUM CHLORIDE 0.9 % IV SOLN
10.0000 mg | Freq: Once | INTRAVENOUS | Status: DC
  Filled 2019-11-25: qty 1

## 2019-11-25 MED ORDER — FAMOTIDINE IN NACL 20-0.9 MG/50ML-% IV SOLN
20.0000 mg | Freq: Once | INTRAVENOUS | Status: AC
  Administered 2019-11-25: 20 mg via INTRAVENOUS

## 2019-11-25 MED ORDER — POTASSIUM CHLORIDE CRYS ER 20 MEQ PO TBCR
40.0000 meq | EXTENDED_RELEASE_TABLET | Freq: Once | ORAL | Status: AC
  Administered 2019-11-25: 40 meq via ORAL

## 2019-11-25 MED ORDER — POTASSIUM CHLORIDE CRYS ER 20 MEQ PO TBCR
EXTENDED_RELEASE_TABLET | ORAL | Status: AC
  Filled 2019-11-25: qty 2

## 2019-11-25 MED ORDER — DIPHENHYDRAMINE HCL 25 MG PO CAPS
ORAL_CAPSULE | ORAL | Status: AC
  Filled 2019-11-25: qty 2

## 2019-11-25 MED ORDER — SODIUM CHLORIDE 0.9 % IV SOLN
375.0000 mg/m2 | Freq: Once | INTRAVENOUS | Status: DC
  Filled 2019-11-25: qty 70

## 2019-11-25 MED ORDER — ACETAMINOPHEN 325 MG PO TABS
ORAL_TABLET | ORAL | Status: AC
  Filled 2019-11-25: qty 2

## 2019-11-26 ENCOUNTER — Other Ambulatory Visit: Payer: Self-pay | Admitting: *Deleted

## 2019-11-26 ENCOUNTER — Inpatient Hospital Stay: Payer: Medicaid Other

## 2019-11-26 VITALS — BP 115/74 | HR 69 | Temp 97.7°F | Resp 18

## 2019-11-26 DIAGNOSIS — Z5112 Encounter for antineoplastic immunotherapy: Secondary | ICD-10-CM | POA: Diagnosis not present

## 2019-11-26 DIAGNOSIS — Z7189 Other specified counseling: Secondary | ICD-10-CM

## 2019-11-26 DIAGNOSIS — C884 Extranodal marginal zone B-cell lymphoma of mucosa-associated lymphoid tissue [MALT-lymphoma]: Secondary | ICD-10-CM

## 2019-11-26 MED ORDER — SODIUM CHLORIDE 0.9 % IV SOLN
10.0000 mg | Freq: Once | INTRAVENOUS | Status: AC
  Administered 2019-11-26: 10 mg via INTRAVENOUS
  Filled 2019-11-26: qty 10

## 2019-11-26 MED ORDER — OXYCODONE HCL 5 MG PO TABS: 5.0000 mg | ORAL_TABLET | Freq: Once | ORAL | Status: DC

## 2019-11-26 MED ORDER — ACETAMINOPHEN 325 MG PO TABS
ORAL_TABLET | ORAL | Status: AC
  Filled 2019-11-26: qty 2

## 2019-11-26 MED ORDER — FAMOTIDINE IN NACL 20-0.9 MG/50ML-% IV SOLN
20.0000 mg | Freq: Once | INTRAVENOUS | Status: AC
  Administered 2019-11-26: 20 mg via INTRAVENOUS

## 2019-11-26 MED ORDER — DIPHENHYDRAMINE HCL 25 MG PO CAPS
ORAL_CAPSULE | ORAL | Status: AC
  Filled 2019-11-26: qty 2

## 2019-11-26 MED ORDER — ACETAMINOPHEN 325 MG PO TABS
325.0000 mg | ORAL_TABLET | Freq: Once | ORAL | Status: AC
  Administered 2019-11-26: 325 mg via ORAL

## 2019-11-26 MED ORDER — SODIUM CHLORIDE 0.9 % IV SOLN
375.0000 mg/m2 | Freq: Once | INTRAVENOUS | Status: AC
  Administered 2019-11-26: 700 mg via INTRAVENOUS
  Filled 2019-11-26: qty 50

## 2019-11-26 MED ORDER — SODIUM CHLORIDE 0.9 % IV SOLN
Freq: Once | INTRAVENOUS | Status: AC
  Filled 2019-11-26: qty 250

## 2019-11-26 MED ORDER — ACETAMINOPHEN 500 MG PO TABS: 500.0000 mg | ORAL_TABLET | Freq: Once | ORAL | Status: AC

## 2019-11-26 MED ORDER — ACETAMINOPHEN 500 MG PO TABS: 500.0000 mg | ORAL_TABLET | Freq: Once | ORAL | Status: DC

## 2019-11-26 MED ORDER — OXYCODONE HCL 5 MG PO TABS
5.0000 mg | ORAL_TABLET | Freq: Once | ORAL | Status: AC
  Administered 2019-11-26: 5 mg via ORAL

## 2019-11-26 MED ORDER — OXYCODONE HCL 5 MG PO TABS
ORAL_TABLET | ORAL | Status: AC
  Filled 2019-11-26: qty 1

## 2019-11-26 MED ORDER — DIPHENHYDRAMINE HCL 25 MG PO CAPS
50.0000 mg | ORAL_CAPSULE | Freq: Once | ORAL | Status: AC
  Administered 2019-11-26: 50 mg via ORAL

## 2019-11-26 MED ORDER — ACETAMINOPHEN 325 MG PO TABS: 650.0000 mg | ORAL_TABLET | Freq: Once | ORAL | Status: DC

## 2019-11-26 MED ORDER — FAMOTIDINE IN NACL 20-0.9 MG/50ML-% IV SOLN
INTRAVENOUS | Status: AC
  Filled 2019-11-26: qty 50

## 2019-11-26 NOTE — Progress Notes (Addendum)
Patient lost IV access during premedication administration on 11/25/19. Could not reaccess.  Discontinued Ruxience order from 11/25/19.  Hardie Pulley, PharmD, BCPS, BCOP

## 2019-11-26 NOTE — Patient Instructions (Signed)
[facility] Discharge Instructions for Patients Receiving Chemotherapy  Today you received the following chemotherapy agents:  Rituxan   To help prevent nausea and vomiting after your treatment, we encourage you to take your nausea medication as prescribed.   If you develop nausea and vomiting that is not controlled by your nausea medication, call the clinic.   BELOW ARE SYMPTOMS THAT SHOULD BE REPORTED IMMEDIATELY:  *FEVER GREATER THAN 100.5 F  *CHILLS WITH OR WITHOUT FEVER  NAUSEA AND VOMITING THAT IS NOT CONTROLLED WITH YOUR NAUSEA MEDICATION  *UNUSUAL SHORTNESS OF BREATH  *UNUSUAL BRUISING OR BLEEDING  TENDERNESS IN MOUTH AND THROAT WITH OR WITHOUT PRESENCE OF ULCERS  *URINARY PROBLEMS  *BOWEL PROBLEMS  UNUSUAL RASH Items with * indicate a potential emergency and should be followed up as soon as possible.  Feel free to call the clinic should you have any questions or concerns. The clinic phone number is [phone].  Please show the CHEMO ALERT CARD at check-in to the Emergency Department and triage nurse.   

## 2019-11-26 NOTE — Progress Notes (Signed)
0930 - Pt C/O lower R back pain that started after she [facility] yesterday, is very painful this morning, she took two tylenol at home.  Dr. Irene Limbo informed, tylenol & oxycodone ordered.  Pharmacy informed, pt does not need tylenol 650 mg as premed for rituxan.

## 2019-12-03 ENCOUNTER — Telehealth: Payer: Self-pay | Admitting: Hematology

## 2019-12-03 NOTE — Telephone Encounter (Signed)
Scheduled per 08/25 los, patient has been called and notified.

## 2019-12-12 ENCOUNTER — Other Ambulatory Visit: Payer: Medicaid Other

## 2019-12-15 ENCOUNTER — Ambulatory Visit
Admission: RE | Admit: 2019-12-15 | Discharge: 2019-12-15 | Disposition: A | Discharge status: Home / Self Care | Payer: Medicaid Other | Source: Ambulatory Visit | Attending: Hematology | Admitting: Hematology

## 2019-12-15 DIAGNOSIS — C884 Extranodal marginal zone b-cell lymphoma of mucosa-associated lymphoid tissue (malt-lymphoma) not having achieved remission: Secondary | ICD-10-CM

## 2019-12-15 DIAGNOSIS — Z5112 Encounter for antineoplastic immunotherapy: Secondary | ICD-10-CM

## 2019-12-15 MED ORDER — GADOBENATE DIMEGLUMINE 529 MG/ML IV SOLN
15.0000 mL | Freq: Once | INTRAVENOUS | Status: AC | PRN
  Administered 2019-12-15: 15 mL via INTRAVENOUS

## 2019-12-16 ENCOUNTER — Inpatient Hospital Stay: Payer: Medicaid Other | Attending: Internal Medicine | Admitting: Hematology

## 2019-12-16 DIAGNOSIS — C884 Extranodal marginal zone B-cell lymphoma of mucosa-associated lymphoid tissue [MALT-lymphoma]: Secondary | ICD-10-CM

## 2019-12-16 NOTE — Progress Notes (Signed)
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 female who has been referred to [place] by my colleague Dr. Cecil Cobbs in Neuro-Oncology for evaluation and management of her Newly diagnosed Extranodal Marginal Zone Lymphoma. She is accompanied today by her sister [name] via [employer]. The pt reports that she is doing well overall.  Prior to today's visit, the pt presented to the ED on 07/21/18 with complaints of a headache for the past month which presented intermittently and was worsened by bending, coughing, or laughing. She had a CT Head and MRI Brain which revealed extensive dural thickening, as noted below. She then had a right posterior fossa craniectomy on 07/28/18 for open exicisional biopsies of the right cerebellar hemisphere and dura which revealed Extranodal marginal zone lymphoma. The pt then established care with my colleague Dr. Cecil Cobbs in Neuro-Onc on 08/11/18. She has been taking 4mg  Decadron daily.  The pt reports that developed worsened headaches about a month ago, and notes that when she laughed it felt like "electiricty" was running through the sides and back of her head. She notes that some mild headaches had begun a couple months ago. Denies double vision, changes in vision, loss of balance, or other senses being affected. The pt notes that in addition to this, she developed redness in the eyes in the past couple months, saw eye doctor and was prescribed eye drops. Denies other concerns in the last 6 months including bone pains, fatigue, fevers, chills, night sweats or unexpected weight loss. She notes that her headaches have resolved. She notes she began Decadron after her biopsy. She denies having "any symptoms  whatsoever," currently.   Of note prior to the patient's visit today, pt has had an MRI Brain completed on 07/21/18 with results revealing "Extensive dural thickening surrounding the right cerebellar hemisphere with mass-effect and edema in the right cerebellum. The process appears to extend into the upper cervical canal. Mild obstructive hydrocephalus. Differential includes tumor including metastatic disease and lymphoma. Chronic inflammatory process such as sarcoid is a consideration however chest x-ray negative. TB and atypical infection/fungus also possible. 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  On review of systems, pt reports good energy levels, headache resolution, and denies joint issues, skin rashes, dry mouth, dry eyes, fevers, chills, night sweats, bone pains, unexpected weight loss, fatigue, double vision, loss of vision, affected senses, loss of balance, and any other symptoms.   On PMHx the pt reports sickle cel trait, HTN, hyperlipidemia. On Social Hx the pt reports that she smoked 1 ppd for the last 42 years, and reports having quit two days ago. 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 On Family Hx the pt reports dad with lupus and sister with sarcoidosis. Daughter and son with sickle cell disease. Denies other blood disorders or cancers. Endorses Penicillin allergy  Interval History:  I connected with  Amber Drake on 12/16/19 by telephone and verified that I am speaking  with the correct person using two identifiers.   I discussed the limitations of evaluation and management by telemedicine. The patient expressed understanding and agreed to proceed.  Other persons participating in the visit and their role in  the encounter:       -Yevette Edwards, Medical Scribe  Patient's location: Home Provider's location: [place] at [employer] [facility] returns today for management and evaluation of her CNS Extranodal Marginal Zone Lymphoma. The patient's last visit with [place] was on 11/25/2019. The pt reports that she is doing well overall.  The pt reports that she has felt well since our last visit. She denies any new symptoms or concerns.  Of note since the patient's last visit, pt has had MRI Brain ([id number]) completed on 12/15/2019 with results revealing "No acute intracranial abnormality. 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   MEDICAL HISTORY:  Past Medical History:  Diagnosis Date  . Anemia   . Arthritis   . Blood dyscrasia    sickle cell trait  . Cancer ([place])    lymphoma  . Carbuncle and furuncle   . Hypercholesterolemia   . Hypertension    does not take meds  . Sickle cell trait ([place])     SURGICAL HISTORY: Past Surgical History:  Procedure Laterality Date  . APPENDECTOMY    . APPLICATION OF CRANIAL NAVIGATION N/A 07/28/2018   Procedure: APPLICATION OF CRANIAL NAVIGATION;  Surgeon: Kary Kos, MD;  Location: [place];  Service: Neurosurgery;  Laterality: N/A;  . BRAIN SURGERY    . CARPAL TUNNEL RELEASE  03/23/2011   Procedure: CARPAL TUNNEL RELEASE;  Surgeon: Sanjuana Kava;  Location: [facility];  Service: Orthopedics;  Laterality: Left;  . CARPAL TUNNEL RELEASE  05/10/2011   Procedure: CARPAL TUNNEL RELEASE;  Surgeon: Sanjuana Kava, MD;  Location: [facility];  Service: Orthopedics;  Laterality: Right;  . CESAREAN SECTION     x 2  . INCISION AND DRAINAGE PERIRECTAL ABSCESS  12/21/09  . PR DURAL GRAFT REPAIR,SPINE DEFECT N/A 07/28/2018   Procedure: Stereotactic open biopsy of Right cerebellar hemisphere and dura with brainlab;  Surgeon: Kary Kos, MD;  Location: [place];   Service: Neurosurgery;  Laterality: N/A;  Stereotactic open biopsy of Right cerebellar hemisphere and dura with brainlab  . PROCTOSCOPY  10/17/2010   Procedure: PROCTOSCOPY;  Surgeon: Scherry Ran;  Location: [facility];  Service: General;  Laterality: N/A;  Rigid Proctoscopy/Possible Fistula in Ano  Procedure ended at 1003  . THERAPEUTIC ABORTION     x2    SOCIAL HISTORY: Social History   Socioeconomic History  . Marital status: Married    Spouse name: Not on file  . Number of children: Not on file  . Years of education: Not on file  . Highest education level: Not on file  Occupational History  . Not on file  Tobacco Use  . Smoking status: Current Every Day Smoker    Packs/day: 0.50    Years: 30.00    Pack years: 15.00    Types: Cigarettes  . Smokeless tobacco: Never Used  Vaping Use  . Vaping Use: Never used  Substance and Sexual Activity  . Alcohol use: No  . 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 Sexual activity: Yes    Partners: Male    Birth control/protection: None  Other Topics Concern  .  Not on file  Social History Narrative  . Not on file   Social Determinants of Health   Financial Resource Strain:   . Difficulty of Paying Living Expenses: Not on file  Food Insecurity:   . Worried About Charity fundraiser in the Last Year: Not on file  . Ran Out of Food in the Last Year: Not on file  Transportation Needs:   . Lack of Transportation (Medical): Not on file  . Lack of Transportation (Non-Medical): Not on file  Physical Activity:   . Days of Exercise per Week: Not on file  . Minutes of Exercise per Session: Not on file  Stress:   . Feeling of Stress : Not on file  Social Connections:   . Frequency of Communication with Friends and Family: Not on file  . Frequency of Social Gatherings with Friends and Family: Not on file  . Attends Religious Services: Not on file  . Active Member of Clubs or Organizations: Not on file  . Attends Theatre manager Meetings: Not on file  . Marital Status: Not on file  Intimate Partner Violence:   . Fear of Current or Ex-Partner: Not on file  . Emotionally Abused: Not on file  . Physically Abused: Not on file  . Sexually Abused: Not on file    FAMILY HISTORY: Family History  Problem Relation Age of [place]  . Kidney failure Daughter   . Sickle cell anemia Daughter   . Sickle cell trait Daughter   . Sickle cell anemia Son   . Hypertension Mother   . Hyperlipidemia Mother   . Hypertension Father   . Lupus Father        skin  . Hypertension Other   . Sarcoidosis Sister   . Anesthesia problems Neg Hx   . Hypotension Neg Hx   . Malignant hyperthermia Neg Hx   . Pseudochol deficiency Neg Hx     ALLERGIES:  is allergic to penicillins, penicillins cross reactors, and tape.  MEDICATIONS:  Current Outpatient Medications  Medication Sig Dispense Refill  . albuterol (PROAIR HFA) 108 (90 Base) MCG/ACT inhaler Inhale 2 puffs into the lungs 4 (four) times daily as needed. (Patient not taking: Reported on 10/13/2019)    . atorvastatin (LIPITOR) 20 MG tablet Take 1 tablet (20 mg total) by mouth daily. 90 tablet 2  . cephALEXin (KEFLEX) 500 MG capsule Take 1 capsule (500 mg total) by mouth 3 (three) times daily. 21 capsule 0  . clindamycin (CLINDAGEL) 1 % gel Apply topically 2 (two) times daily. To boils in the groin 30 g 1  . cyclobenzaprine (FLEXERIL) 10 MG tablet Take 1 tablet (10 mg total) by mouth 3 (three) times daily as needed for muscle spasms. (Patient not taking: Reported on 05/20/2019) 40 tablet 0  . dexamethasone (DECADRON) 1 MG tablet 1mg  po daily with breakfast till 8/24 then 1mg  po every other day for 10 days then stop (Patient not taking: Reported on 10/13/2019) 30 tablet 0  . doxycycline (VIBRAMYCIN) 100 MG capsule Take 1 capsule (100 mg total) by mouth 2 (two) times daily. 20 capsule 0  . ferrous sulfate 325 (65 FE) MG tablet Take 325 mg by mouth daily with breakfast. (Patient  not taking: Reported on 10/13/2019)    . HYDROcodone-acetaminophen (NORCO/VICODIN) 5-325 MG tablet Take one tab po q 4 hrs prn pain 8 tablet 0  . ibuprofen (ADVIL) 800 MG tablet Take 1 tablet (800 mg total) by mouth every 8 (eight) hours  as needed. 20 tablet 1  . lisinopril-hydrochlorothiazide (PRINZIDE,ZESTORETIC) 20-12.5 MG tablet Take 1 tablet by mouth daily.    . Multiple Vitamin (MULTIVITAMIN WITH MINERALS) TABS tablet Take 1 tablet by mouth daily.    . potassium chloride SA (KLOR-CON) 20 MEQ tablet TAKE (1) TABLET BY MOUTH TWICE DAILY. 60 tablet 0  . predniSONE (DELTASONE) 20 MG tablet 3 po once a day for 2 days, then 2 po once a day for 3 days, then 1 po once a day for 3 days (Patient not taking: Reported on 10/13/2019) 15 tablet 0  . ranitidine (ZANTAC) 150 MG capsule Take 150 mg by mouth 2 (two) times daily. (Patient not taking: Reported on 10/13/2019)    . traMADol (ULTRAM) 50 MG tablet Take 1 tablet (50 mg total) by mouth every 6 (six) hours as needed for moderate pain or severe pain. (Patient not taking: Reported on 10/13/2019) 30 tablet 0  . Vitamins/Minerals TABS Take by mouth. (Patient not taking: Reported on 10/13/2019)     No current facility-administered medications for this visit.   Facility-Administered Medications Ordered in Other Visits  Medication Dose Route Frequency Provider Last Rate Last Admin  . acetaminophen (TYLENOL) tablet 500 mg  500 mg Oral Once Brunetta Genera, MD       And  . oxyCODONE (Oxy IR/ROXICODONE) immediate release tablet 5 mg  5 mg Oral Once Brunetta Genera, MD      . dexamethasone (DECADRON) 10 mg in sodium chloride 0.9 % 50 mL IVPB  10 mg Intravenous Once Brunetta Genera, MD        REVIEW OF SYSTEMS:   A 10+ POINT REVIEW OF SYSTEMS WAS OBTAINED including neurology, dermatology, psychiatry, cardiac, respiratory, lymph, extremities, GI, GU, Musculoskeletal, constitutional, breasts, reproductive, HEENT.  All pertinent positives are noted in  the HPI.  All others are negative.   PHYSICAL EXAMINATION: ECOG PERFORMANCE STATUS: 1 - Symptomatic but completely ambulatory  . There were no vitals filed for this visit. There were no vitals filed for this visit. .There is no height or weight on file to calculate BMI.   Telehealth visit   LABORATORY DATA:  I have reviewed the data as listed  . CBC Latest Ref Rng & Units 11/25/2019 09/23/2019 07/22/2019  WBC 4.0 - 10.5 K/uL 6.4 5.3 5.4  Hemoglobin 12.0 - 15.0 g/dL 11.8(L) 12.3 12.7  Hematocrit 36 - 46 % 34.7(L) 37.1 38.3  Platelets 150 - 400 K/uL 364 402(H) 349    . CMP Latest Ref Rng & Units 11/25/2019 09/23/2019 07/22/2019  Glucose 70 - 99 mg/dL 117(H) 98 110(H)  BUN 6 - 20 mg/dL 16 11 15   Creatinine 0.44 - 1.00 mg/dL 0.82 0.80 0.82  Sodium 135 - 145 mmol/L 142 141 143  Potassium 3.5 - 5.1 mmol/L 3.1(L) 3.5 3.2(L)  Chloride 98 - 111 mmol/L 107 107 104  CO2 22 - 32 mmol/L 27 26 26   Calcium 8.9 - 10.3 mg/dL 10.6(H) 10.4(H) 9.8  Total Protein 6.5 - 8.1 g/dL 6.9 6.9 7.0  Total Bilirubin 0.3 - 1.2 mg/dL 0.3 0.2(L) 0.3  Alkaline Phos 38 - 126 U/L 77 74 80  AST 15 - 41 U/L 19 16 16   ALT 0 - 44 U/L 20 16 12     09/12/18 BM Biopsy:    07/28/18 Right cerebellar hemisphere and dura biopsy:     RADIOGRAPHIC STUDIES: I have personally reviewed the radiological images as listed and agreed with the findings in the report. MR Brain W Wo  Contrast  Result Date: 12/15/2019 CLINICAL DATA:  Marginal zone lymphoma.  Assess treatment response. EXAM: MRI HEAD WITHOUT AND WITH CONTRAST TECHNIQUE: Multiplanar, multiecho pulse sequences of the brain and surrounding structures were obtained without and with intravenous contrast. CONTRAST:  28mL MULTIHANCE GADOBENATE DIMEGLUMINE 529 MG/ML IV SOLN COMPARISON:  MRI head 01/13/2019 FINDINGS: Brain: 5 mm colloid cyst in the anterior superior third ventricle is unchanged. No hydrocephalus. Negative for acute infarct. Few small deep white matter  hyperintensities bilaterally. Negative for hemorrhage or mass Normal enhancement following contrast infusion. Right occipital craniotomy in the posterior fossa unchanged from prior studies. Vascular: Normal arterial flow voids Skull and upper cervical spine: Right occipital craniotomy. No acute skeletal abnormality. Sinuses/Orbits: Negative Other: None IMPRESSION: No acute intracranial abnormality.  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 Electronically Signed   By: Franchot Gallo M.D.   On: 12/15/2019 14:42    ASSESSMENT & PLAN:  59 y.o. female with  1. 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 The process appears to extend into the upper cervical canal. Mild obstructive hydrocephalus. Differential includes tumor including metastatic disease and lymphoma. Chronic inflammatory process such as sarcoid is a consideration however chest x-ray negative. TB and atypical infection/fungus also possible. 6 mm colloid cyst felt to be a separate problem."  07/25/18 CT C/A/P which did not reveal significant abnormality  09/12/18 BM Bx revealed no evidence of lymphoma  09/08/18 PET/CT revealed "No hypermetabolic mass or adenopathy identified within the chest abdomen or pelvis to suggest metabolically active tumor. 2. Nonspecific focus of increased uptake within the cord extending from T12 to L1. Cannot rule out additional site of CNS lymphoma. Consider further evaluation with contrast enhanced MRI through  this area. 3. Aortic Atherosclerosis and Emphysema."  10/16/2018 MRI brain w and w/o contrast revealed "1. Resolved dural mass in the right posterior fossa with minimal smooth dural thickening that may be treatment related. Cerebellar edema and mass effect is also resolved. No new site of disease. 2. 6 mm colloid cyst."  10/20/2018 MRI cervical spine w and w/o contrast revealed "Negative for lymphoma. No acute abnormality. Central disc protrusion at C5-6 effaces the ventral thecal sac causing mild central canal narrowing. There is also a shallow disc bulge at C6-7 which narrows but does not efface the ventral thecal Sac."  01/13/2019 MRI Brain ([id number]) revealed "1. Stable and satisfactory post treatment appearance of the posterior fossa. 2. Stable small 6 mm colloid cyst. 3. No new intracranial abnormality."  05/06/2019 MRI Cervical Spine ([id number]) revealed "No evidence of lymphoma in the cervical spine. Previously noted dural enhancement in the posterior fossa and cervical canal has resolved. No cord compression or cord lesion. Chronic cervical spine degenerative changes are stable from the prior study."  PLAN: -Discussed 12/15/2019 MRI Brain ([id number]) which revealed "No acute intracranial abnormality. 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  -Will continue  maintenance Rituxan q8weeks for up to three years. The pt has no prohibitive toxicities.  -Continue to avoid Ibuprofen, use OTC Tylenol for pain management.    FOLLOW UP: F/u as per scheduled appointments for labs, MD visit and next cycle of maintenance Rituxan on 01/26/20   The total time spent in the appt was 10 minutes and more than 50% was on counseling and direct patient cares.  All of the patient's questions were  answered with apparent satisfaction. The patient knows to call the clinic with any problems, questions or concerns.    Sullivan Lone MD [place] AAHIVMS [facility] Hematology/Oncology Physician [facility]  (Office):       [phone] (Work cell):  [phone] (Fax):           [phone]  12/16/2019 4:42 PM  I, Yevette Edwards, am acting as a scribe for Dr. Sullivan Lone.   .I have reviewed the above documentation for accuracy and completeness, and I agree with the above. Brunetta Genera MD

## 2019-12-24 ENCOUNTER — Other Ambulatory Visit: Payer: Self-pay

## 2019-12-24 ENCOUNTER — Emergency Department (HOSPITAL_COMMUNITY): Payer: Medicaid Other

## 2019-12-24 ENCOUNTER — Emergency Department (HOSPITAL_COMMUNITY)
Admission: EM | Admit: 2019-12-24 | Discharge: 2019-12-24 | Disposition: A | Discharge status: Home / Self Care | Payer: Medicaid Other | Attending: Emergency Medicine | Admitting: Emergency Medicine

## 2019-12-24 DIAGNOSIS — F17210 Nicotine dependence, cigarettes, uncomplicated: Secondary | ICD-10-CM | POA: Insufficient documentation

## 2019-12-24 DIAGNOSIS — I10 Essential (primary) hypertension: Secondary | ICD-10-CM | POA: Insufficient documentation

## 2019-12-24 DIAGNOSIS — L732 Hidradenitis suppurativa: Secondary | ICD-10-CM | POA: Diagnosis not present

## 2019-12-24 DIAGNOSIS — Z79899 Other long term (current) drug therapy: Secondary | ICD-10-CM | POA: Diagnosis not present

## 2019-12-24 DIAGNOSIS — C8590 Non-Hodgkin lymphoma, unspecified, unspecified site: Secondary | ICD-10-CM | POA: Insufficient documentation

## 2019-12-24 DIAGNOSIS — L02214 Cutaneous abscess of groin: Secondary | ICD-10-CM | POA: Diagnosis present

## 2019-12-24 DIAGNOSIS — M5442 Lumbago with sciatica, left side: Secondary | ICD-10-CM

## 2019-12-24 DIAGNOSIS — M5432 Sciatica, left side: Secondary | ICD-10-CM | POA: Diagnosis not present

## 2019-12-24 MED ORDER — CLINDAMYCIN HCL 150 MG PO CAPS
300.0000 mg | ORAL_CAPSULE | Freq: Once | ORAL | Status: AC
  Administered 2019-12-24: 300 mg via ORAL
  Filled 2019-12-24: qty 2

## 2019-12-24 MED ORDER — OXYCODONE-ACETAMINOPHEN 5-325 MG PO TABS: 1.0000 | ORAL_TABLET | Freq: Four times a day (QID) | ORAL | 0 refills | Status: AC | PRN

## 2019-12-24 MED ORDER — CLINDAMYCIN HCL 150 MG PO CAPS: 300.0000 mg | ORAL_CAPSULE | Freq: Four times a day (QID) | ORAL | 0 refills | Status: AC

## 2019-12-24 MED ORDER — OXYCODONE-ACETAMINOPHEN 5-325 MG PO TABS
1.0000 | ORAL_TABLET | Freq: Once | ORAL | Status: AC
  Administered 2019-12-24: 1 via ORAL
  Filled 2019-12-24: qty 1

## 2019-12-24 NOTE — ED Notes (Signed)
Sciatica pain and boils to labia are concerns. No infection , no redness or warmth noted to boils.

## 2019-12-24 NOTE — ED Provider Notes (Signed)
[facility] EMERGENCY DEPARTMENT Provider Note   CSN: [id number] Arrival date & time: 12/24/19  1006     History Chief Complaint  Patient presents with  . Abscess    Amber Drake is a 59 y.o. female with a history of lymphoma in remission, HTN, occasional low back pain with sciatica and hydradenitis with frequent abscess' in her groin region presenting with a flare of her hidradenitis over the past week.  She describes multiple small tender abscess' without any drainage despite using warm soaks.  She also reports a flair of her sciatica with pain radiating down through her left buttock.  She has taken flexeril for her back and rest without relief of symptoms. She denies weakness or numbness in her legs, also no urinary or fecal incontinence or retention.  No fevers, chills or diaphoresis.  Also no abdominal pain or distention.  She does not want I&D, stating clindamycin and warm compresses usually resolve her skin flares.   The history is provided by the patient.       Past Medical History:  Diagnosis Date  . Anemia   . Arthritis   . Blood dyscrasia    sickle cell trait  . Cancer ([place])    lymphoma  . Carbuncle and furuncle   . Hypercholesterolemia   . Hypertension    does not take meds  . Sickle cell trait [facility])     Patient Active Problem List   Diagnosis Date Noted  . 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 Counseling regarding advance care planning and goals of care 10/23/2018  . 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 Brain tumor ([place]) 07/28/2018  . Hidradenitis 07/31/2016  . Recurrent boils 07/31/2016  . Hyperlipidemia 06/15/2015  . Pain of left hand 05/19/2015  . Hand pain, right 05/19/2015  . Hematuria 03/03/2015  . Esophageal reflux 03/03/2015  . Essential hypertension, benign 03/03/2015  . Bronchitis due to tobacco use 10/17/2010    Past Surgical History:  Procedure Laterality Date  . APPENDECTOMY    . APPLICATION OF  CRANIAL NAVIGATION N/A 07/28/2018   Procedure: APPLICATION OF CRANIAL NAVIGATION;  Surgeon: Kary Kos, MD;  Location: [place];  Service: Neurosurgery;  Laterality: N/A;  . BRAIN SURGERY    . CARPAL TUNNEL RELEASE  03/23/2011   Procedure: CARPAL TUNNEL RELEASE;  Surgeon: Sanjuana Kava;  Location: [facility];  Service: Orthopedics;  Laterality: Left;  . CARPAL TUNNEL RELEASE  05/10/2011   Procedure: CARPAL TUNNEL RELEASE;  Surgeon: Sanjuana Kava, MD;  Location: [facility];  Service: Orthopedics;  Laterality: Right;  . CESAREAN SECTION     x 2  . INCISION AND DRAINAGE PERIRECTAL ABSCESS  12/21/09  . PR DURAL GRAFT REPAIR,SPINE DEFECT N/A 07/28/2018   Procedure: Stereotactic open biopsy of Right cerebellar hemisphere and dura with brainlab;  Surgeon: Kary Kos, MD;  Location: [place];  Service: Neurosurgery;  Laterality: N/A;  Stereotactic open biopsy of Right cerebellar hemisphere and dura with brainlab  . PROCTOSCOPY  10/17/2010   Procedure: PROCTOSCOPY;  Surgeon: Scherry Ran;  Location: [facility];  Service: General;  Laterality: N/A;  Rigid Proctoscopy/Possible Fistula in Ano  Procedure ended at 1003  . THERAPEUTIC ABORTION     x2     OB History    Gravida  4   Para  2   Term  2   Preterm      AB  2   Living  2     SAB  TAB      Ectopic      Multiple      Live Births  2           Family History  Problem Relation Age of Onset  . Kidney failure Daughter   . Sickle cell anemia Daughter   . Sickle cell trait Daughter   . Sickle cell anemia Son   . Hypertension Mother   . Hyperlipidemia Mother   . Hypertension Father   . Lupus Father        skin  . Hypertension Other   . Sarcoidosis Sister   . Anesthesia problems Neg Hx   . Hypotension Neg Hx   . Malignant hyperthermia Neg Hx   . Pseudochol deficiency Neg Hx     Social History   Tobacco Use  . Smoking status: Current Every Day Smoker    Packs/day: 0.50    Years: 30.00    Pack years: 15.00    Types:  Cigarettes  . Smokeless tobacco: Never Used  Vaping Use  . Vaping Use: Never used  Substance Use Topics  . Alcohol use: No  . 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 Authorizing Provider  albuterol (PROAIR HFA) 108 (90 Base) MCG/ACT inhaler Inhale 2 puffs into the lungs 4 (four) times daily as needed. Patient not taking: Reported on 10/13/2019 02/25/17   [provider]  atorvastatin (LIPITOR) 20 MG tablet Take 1 tablet (20 mg total) by mouth daily. 03/14/15   Soyla Dryer, PA-C  cephALEXin (KEFLEX) 500 MG capsule Take 1 capsule (500 mg total) by mouth 3 (three) times daily. 10/22/19   Veryl Speak, MD  clindamycin (CLEOCIN) 150 MG capsule Take 2 capsules (300 mg total) by mouth every 6 (six) hours for 7 days. 12/24/19 12/31/19  Evalee Jefferson, PA-C  clindamycin (CLINDAGEL) 1 % gel Apply topically 2 (two) times daily. To boils in the groin 07/22/19   Brunetta Genera, MD  cyclobenzaprine (FLEXERIL) 10 MG tablet Take 1 tablet (10 mg total) by mouth 3 (three) times daily as needed for muscle spasms. Patient not taking: Reported on 05/20/2019 11/07/16   Sanjuana Kava, MD  dexamethasone (DECADRON) 1 MG tablet 1mg  po daily with breakfast till 8/24 then 1mg  po every other day for 10 days then stop Patient not taking: Reported on 10/13/2019 11/17/18   Brunetta Genera, MD  doxycycline (VIBRAMYCIN) 100 MG capsule Take 1 capsule (100 mg total) by mouth 2 (two) times daily. 11/04/19   Triplett, Tammy, PA-C  ferrous sulfate 325 (65 FE) MG tablet Take 325 mg by mouth daily with breakfast. Patient not taking: Reported on 10/13/2019    [provider]  ibuprofen (ADVIL) 800 MG tablet Take 1 tablet (800 mg total) by mouth every 8 (eight) hours as needed. 10/13/19   Milton Ferguson, MD  lisinopril-hydrochlorothiazide (PRINZIDE,ZESTORETIC) 20-12.5 MG tablet Take 1 tablet by mouth daily.     [provider]  Multiple Vitamin (MULTIVITAMIN WITH MINERALS) TABS tablet Take 1 tablet by mouth daily.    [provider]  oxyCODONE-acetaminophen (PERCOCET/ROXICET) 5-325 MG tablet Take 1 tablet by mouth every 6 (six) hours as needed for up to 3 days. 12/24/19 12/27/19  Evalee Jefferson, PA-C  potassium chloride SA (KLOR-CON) 20 MEQ tablet TAKE (1) TABLET BY MOUTH TWICE DAILY. 11/26/19   Brunetta Genera, MD  predniSONE ([place]) 20  MG tablet 3 po once a day for 2 days, then 2 po once a day for 3 days, then 1 po once a day for 3 days Patient not taking: Reported on 10/13/2019 08/11/19   Lajean Saver, MD  ranitidine (ZANTAC) 150 MG capsule Take 150 mg by mouth 2 (two) times daily. Patient not taking: Reported on 10/13/2019    [provider]  traMADol (ULTRAM) 50 MG tablet Take 1 tablet (50 mg total) by mouth every 6 (six) hours as needed for moderate pain or severe pain. Patient not taking: Reported on 10/13/2019 08/10/19   Brunetta Genera, MD  Vitamins/Minerals TABS Take by mouth. Patient not taking: Reported on 10/13/2019    [provider]    Allergies    Penicillins, Penicillins cross reactors, and Tape  Review of Systems   Review of Systems  Constitutional: Negative for chills and fever.  Respiratory: Negative for shortness of breath.   Cardiovascular: Negative for chest pain and leg swelling.  Gastrointestinal: Negative for abdominal distention, abdominal pain, constipation and vomiting.  Genitourinary: Negative for difficulty urinating, dysuria, flank pain, frequency and urgency.  Musculoskeletal: Positive for back pain. Negative for gait problem and joint swelling.  Skin: Positive for wound. Negative for rash.  Neurological: Negative for weakness and numbness.    Physical Exam Updated Vital Signs BP 112/69 (BP Location: Right Arm)   Pulse 76   Temp 98.1 F (36.7 C) (Oral)   Resp 16   Ht 5\' 4"  (1.626 m)   Wt 77.1 kg   LMP 08/12/2010    SpO2 100%   BMI 29.18 kg/m   Physical Exam Vitals and nursing note reviewed.  Constitutional:      Appearance: She is well-developed.  HENT:     Head: Normocephalic.  Eyes:     Conjunctiva/sclera: Conjunctivae normal.  Cardiovascular:     Rate and Rhythm: Normal rate.     Comments: Pedal pulses normal. Pulmonary:     Effort: Pulmonary effort is normal.  Abdominal:     General: Bowel sounds are normal. There is no distension.     Palpations: Abdomen is soft. There is no mass.  Musculoskeletal:        General: Normal range of motion.     Cervical back: Normal range of motion and neck supple.     Lumbar back: Tenderness present. No swelling, edema or spasms.       Back:     Comments: ttp along left posterior pelvic rim and buttock.  Palpation reproduces radicular pain to posterior left upper thigh.   Skin:    General: Skin is warm and dry.     Comments: Multiple small indurated abscess' bilateral groin.  No drainage, no surrounding erythema. Scarring of skin c/w chronic suppurativa.   Neurological:     Mental Status: She is alert.     Sensory: No sensory deficit.     Motor: No tremor or atrophy.     Gait: Gait normal.     Deep Tendon Reflexes:     Reflex Scores:      Patellar reflexes are 2+ on the right side and 2+ on the left side.    Comments: No strength deficit noted in hip and knee flexor and extensor muscle groups.  Ankle flexion and extension intact.     ED Results / Procedures / Treatments   Labs (all labs ordered are listed, but only abnormal results are displayed) Labs Reviewed - No data to display  EKG None  Radiology DG Lumbar Spine Complete  Result Date: 12/24/2019 CLINICAL DATA:  Low back pain with left radiculopathy. EXAM: LUMBAR SPINE - COMPLETE 4+ VIEW COMPARISON:  October 13, 2019. FINDINGS: No fracture or spondylolisthesis is noted. Disc spaces are well-maintained. Minimal anterior osteophyte formation is noted at L2-3 and L3-4. IMPRESSION: Minimal  degenerative changes as described above. No acute abnormality is noted. Aortic Atherosclerosis (ICD10-I70.0). Electronically Signed   By: Marijo Conception M.D.   On: 12/24/2019 12:55    Procedures Procedures (including critical care time)  Medications Ordered in ED Medications  oxyCODONE-acetaminophen (PERCOCET/ROXICET) 5-325 MG per tablet 1 tablet (1 tablet Oral Given 12/24/19 1248)  clindamycin (CLEOCIN) capsule 300 mg (300 mg Oral Given 12/24/19 1249)    ED Course  I have reviewed the triage vital signs and the nursing notes.  Pertinent labs & imaging results that were available during my care of the patient were reviewed by me and considered in my medical decision making (see chart for details).    MDM Rules/Calculators/A&P                          No neuro deficit on exam or by history to suggest emergent or surgical presentation.  Outlined worsened sx that should prompt immediate re-evaluation including distal weakness, bowel/bladder retention/incontinence. Few oxycodone prescribed, advised to continue her flexeril.   Discussed I&D vs abx and warm soaks. No fluctuant lesions and pt does not want I&D which is reasonable given no appreciable abscess pockets currently.  Advised continued warm soaks.  Clindamycin started.  Return precautions outlined.     Final Clinical Impression(s) / ED Diagnoses Final diagnoses:  Hidradenitis suppurativa  Acute left-sided low back pain with left-sided sciatica    Rx / DC Orders ED Discharge Orders         Ordered    clindamycin (CLEOCIN) 150 MG capsule  Every 6 hours        12/24/19 1359    oxyCODONE-acetaminophen (PERCOCET/ROXICET) 5-325 MG tablet  Every 6 hours PRN        12/24/19 1359           Evalee Jefferson, PA-C 12/24/19 1506    Dorie Rank, MD 12/25/19 [phone]

## 2019-12-24 NOTE — Discharge Instructions (Addendum)
Continue with your warms soaks for your abscess sites while you complete the antibiotic.  Do not attempt to squeeze as this can drive the infection deeper.  Return here or see Dr Nevada Crane if you have any worsening symptoms.  Your xray is reassuring today.  Continue using the cyclobenzaprine you were prescribed along with application of heat to your left lower back 20 minutes several times daily to help relax the muscles and heal any deep inflammation.  Stay as active as you comfortably can to maintain muscle tone without heavy lifting which can worsen your pain.  Do not drive within 4 hours of taking oxycodone as this will make you sleepy.   You should get rechecked if your symptoms are not better over the next 7 days or you develop increased pain,  Weakness in your leg(s) or loss of bladder or bowel function - these are symptoms of a worsening condition.

## 2019-12-24 NOTE — ED Triage Notes (Signed)
Pt reports recurrent boils and abscess to groin. Pt reports pain increased for last several days. Pt reports intermittent nausea.

## 2020-01-02 ENCOUNTER — Other Ambulatory Visit: Payer: Medicaid Other

## 2020-01-21 ENCOUNTER — Telehealth: Payer: Self-pay | Admitting: Hematology

## 2020-01-21 NOTE — Telephone Encounter (Signed)
Cancelled 10/26 provider appointment due to provider pal, patient has been called and notified.

## 2020-01-26 ENCOUNTER — Inpatient Hospital Stay: Payer: Medicaid Other | Admitting: Hematology

## 2020-01-26 ENCOUNTER — Other Ambulatory Visit: Payer: Self-pay | Admitting: *Deleted

## 2020-01-26 ENCOUNTER — Other Ambulatory Visit: Payer: Self-pay | Admitting: Hematology

## 2020-01-26 ENCOUNTER — Other Ambulatory Visit: Payer: Self-pay

## 2020-01-26 ENCOUNTER — Inpatient Hospital Stay: Payer: Medicaid Other

## 2020-01-26 ENCOUNTER — Inpatient Hospital Stay: Payer: Medicaid Other | Attending: Internal Medicine

## 2020-01-26 VITALS — BP 129/72 | HR 75 | Temp 98.0°F | Resp 16 | Wt 171.0 lb

## 2020-01-26 DIAGNOSIS — C884 Extranodal marginal zone B-cell lymphoma of mucosa-associated lymphoid tissue [MALT-lymphoma]: Secondary | ICD-10-CM

## 2020-01-26 DIAGNOSIS — Z5112 Encounter for antineoplastic immunotherapy: Secondary | ICD-10-CM | POA: Diagnosis not present

## 2020-01-26 DIAGNOSIS — Z7189 Other specified counseling: Secondary | ICD-10-CM

## 2020-01-26 LAB — LACTATE DEHYDROGENASE: LDH: 172 U/L (ref 98–192)

## 2020-01-26 LAB — CBC WITH DIFFERENTIAL (CANCER CENTER ONLY)
Abs Immature Granulocytes: 0.01 10*3/uL (ref 0.00–0.07)
Basophils Absolute: 0 10*3/uL (ref 0.0–0.1)
Basophils Relative: 0 %
Eosinophils Absolute: 0.1 10*3/uL (ref 0.0–0.5)
Eosinophils Relative: 1 %
HCT: 36 % (ref 36.0–46.0)
Hemoglobin: 12 g/dL (ref 12.0–15.0)
Immature Granulocytes: 0 %
Lymphocytes Relative: 21 %
Lymphs Abs: 1.3 10*3/uL (ref 0.7–4.0)
MCH: 28.1 pg (ref 26.0–34.0)
MCHC: 33.3 g/dL (ref 30.0–36.0)
MCV: 84.3 fL (ref 80.0–100.0)
Monocytes Absolute: 0.5 10*3/uL (ref 0.1–1.0)
Monocytes Relative: 8 %
Neutro Abs: 4.5 10*3/uL (ref 1.7–7.7)
Neutrophils Relative %: 70 %
Platelet Count: 411 10*3/uL — ABNORMAL HIGH (ref 150–400)
RBC: 4.27 MIL/uL (ref 3.87–5.11)
RDW: 12.9 % (ref 11.5–15.5)
WBC Count: 6.4 10*3/uL (ref 4.0–10.5)
nRBC: 0 % (ref 0.0–0.2)

## 2020-01-26 LAB — CMP (CANCER CENTER ONLY)
ALT: 13 U/L (ref 0–44)
AST: 16 U/L (ref 15–41)
Albumin: 3.7 g/dL (ref 3.5–5.0)
Alkaline Phosphatase: 72 U/L (ref 38–126)
Anion gap: 8 (ref 5–15)
BUN: 10 mg/dL (ref 6–20)
CO2: 26 mmol/L (ref 22–32)
Calcium: 10 mg/dL (ref 8.9–10.3)
Chloride: 106 mmol/L (ref 98–111)
Creatinine: 0.73 mg/dL (ref 0.44–1.00)
GFR, Estimated: >60 mL/min (ref >60)
Glucose, Bld: 82 mg/dL (ref 70–99)
Potassium: 3.4 mmol/L — ABNORMAL LOW (ref 3.5–5.1)
Sodium: 140 mmol/L (ref 135–145)
Total Bilirubin: <0.2 mg/dL — ABNORMAL LOW (ref 0.3–1.2)
Total Protein: 6.8 g/dL (ref 6.5–8.1)

## 2020-01-26 MED ORDER — FAMOTIDINE IN NACL 20-0.9 MG/50ML-% IV SOLN
INTRAVENOUS | Status: AC
  Filled 2020-01-26: qty 50

## 2020-01-26 MED ORDER — DIPHENHYDRAMINE HCL 25 MG PO CAPS
50.0000 mg | ORAL_CAPSULE | Freq: Once | ORAL | Status: AC
  Administered 2020-01-26: 50 mg via ORAL

## 2020-01-26 MED ORDER — ACETAMINOPHEN 325 MG PO TABS
650.0000 mg | ORAL_TABLET | Freq: Once | ORAL | Status: AC
  Administered 2020-01-26: 650 mg via ORAL

## 2020-01-26 MED ORDER — SODIUM CHLORIDE 0.9 % IV SOLN
375.0000 mg/m2 | Freq: Once | INTRAVENOUS | Status: AC
  Administered 2020-01-26: 700 mg via INTRAVENOUS
  Filled 2020-01-26: qty 20

## 2020-01-26 MED ORDER — SODIUM CHLORIDE 0.9 % IV SOLN
10.0000 mg | Freq: Once | INTRAVENOUS | Status: AC
  Administered 2020-01-26: 10 mg via INTRAVENOUS
  Filled 2020-01-26: qty 10

## 2020-01-26 MED ORDER — DIPHENHYDRAMINE HCL 25 MG PO CAPS
ORAL_CAPSULE | ORAL | Status: AC
  Filled 2020-01-26: qty 2

## 2020-01-26 MED ORDER — ACETAMINOPHEN 325 MG PO TABS
ORAL_TABLET | ORAL | Status: AC
  Filled 2020-01-26: qty 2

## 2020-01-26 MED ORDER — SODIUM CHLORIDE 0.9 % IV SOLN
Freq: Once | INTRAVENOUS | Status: AC
  Filled 2020-01-26: qty 250

## 2020-01-26 MED ORDER — FAMOTIDINE IN NACL 20-0.9 MG/50ML-% IV SOLN
20.0000 mg | Freq: Once | INTRAVENOUS | Status: AC
  Administered 2020-01-26: 20 mg via INTRAVENOUS

## 2020-01-26 NOTE — Patient Instructions (Signed)
[facility] Discharge Instructions for Patients Receiving Chemotherapy  Today you received the following chemotherapy agents:  Rituxan   To help prevent nausea and vomiting after your treatment, we encourage you to take your nausea medication as prescribed.   If you develop nausea and vomiting that is not controlled by your nausea medication, call the clinic.   BELOW ARE SYMPTOMS THAT SHOULD BE REPORTED IMMEDIATELY:  *FEVER GREATER THAN 100.5 F  *CHILLS WITH OR WITHOUT FEVER  NAUSEA AND VOMITING THAT IS NOT CONTROLLED WITH YOUR NAUSEA MEDICATION  *UNUSUAL SHORTNESS OF BREATH  *UNUSUAL BRUISING OR BLEEDING  TENDERNESS IN MOUTH AND THROAT WITH OR WITHOUT PRESENCE OF ULCERS  *URINARY PROBLEMS  *BOWEL PROBLEMS  UNUSUAL RASH Items with * indicate a potential emergency and should be followed up as soon as possible.  Feel free to call the clinic should you have any questions or concerns. The clinic phone number is [phone].  Please show the CHEMO ALERT CARD at check-in to the Emergency Department and triage nurse.   

## 2020-02-05 ENCOUNTER — Other Ambulatory Visit: Payer: Self-pay | Admitting: *Deleted

## 2020-02-05 MED ORDER — POTASSIUM CHLORIDE CRYS ER 20 MEQ PO TBCR
EXTENDED_RELEASE_TABLET | ORAL | 0 refills | Status: DC
Start: 2020-02-05 — End: 2020-05-24

## 2020-03-22 ENCOUNTER — Other Ambulatory Visit: Payer: Self-pay | Admitting: *Deleted

## 2020-03-22 DIAGNOSIS — C8331 Diffuse large B-cell lymphoma, lymph nodes of head, face, and neck: Secondary | ICD-10-CM

## 2020-03-23 ENCOUNTER — Inpatient Hospital Stay (HOSPITAL_BASED_OUTPATIENT_CLINIC_OR_DEPARTMENT_OTHER): Payer: Medicaid Other | Admitting: Hematology

## 2020-03-23 ENCOUNTER — Inpatient Hospital Stay: Payer: Medicaid Other

## 2020-03-23 ENCOUNTER — Inpatient Hospital Stay: Payer: Medicaid Other | Attending: Internal Medicine

## 2020-03-23 ENCOUNTER — Other Ambulatory Visit: Payer: Self-pay

## 2020-03-23 VITALS — BP 135/67 | HR 70 | Temp 96.9°F | Resp 18 | Ht 64.0 in | Wt 172.3 lb

## 2020-03-23 VITALS — BP 117/78 | HR 66 | Temp 98.3°F | Resp 20

## 2020-03-23 DIAGNOSIS — Z79899 Other long term (current) drug therapy: Secondary | ICD-10-CM | POA: Diagnosis not present

## 2020-03-23 DIAGNOSIS — Z7952 Long term (current) use of systemic steroids: Secondary | ICD-10-CM | POA: Diagnosis not present

## 2020-03-23 DIAGNOSIS — Z5112 Encounter for antineoplastic immunotherapy: Secondary | ICD-10-CM | POA: Diagnosis not present

## 2020-03-23 DIAGNOSIS — I10 Essential (primary) hypertension: Secondary | ICD-10-CM | POA: Insufficient documentation

## 2020-03-23 DIAGNOSIS — E876 Hypokalemia: Secondary | ICD-10-CM

## 2020-03-23 DIAGNOSIS — D573 Sickle-cell trait: Secondary | ICD-10-CM | POA: Insufficient documentation

## 2020-03-23 DIAGNOSIS — E785 Hyperlipidemia, unspecified: Secondary | ICD-10-CM | POA: Diagnosis not present

## 2020-03-23 DIAGNOSIS — C884 Extranodal marginal zone B-cell lymphoma of mucosa-associated lymphoid tissue [MALT-lymphoma]: Secondary | ICD-10-CM

## 2020-03-23 DIAGNOSIS — F17210 Nicotine dependence, cigarettes, uncomplicated: Secondary | ICD-10-CM | POA: Diagnosis not present

## 2020-03-23 DIAGNOSIS — C8331 Diffuse large B-cell lymphoma, lymph nodes of head, face, and neck: Secondary | ICD-10-CM

## 2020-03-23 DIAGNOSIS — Z7189 Other specified counseling: Secondary | ICD-10-CM

## 2020-03-23 LAB — CBC WITH DIFFERENTIAL (CANCER CENTER ONLY)
Abs Immature Granulocytes: 0.01 10*3/uL (ref 0.00–0.07)
Basophils Absolute: 0 10*3/uL (ref 0.0–0.1)
Basophils Relative: 0 %
Eosinophils Absolute: 0.1 10*3/uL (ref 0.0–0.5)
Eosinophils Relative: 1 %
HCT: 34.5 % — ABNORMAL LOW (ref 36.0–46.0)
Hemoglobin: 11.5 g/dL — ABNORMAL LOW (ref 12.0–15.0)
Immature Granulocytes: 0 %
Lymphocytes Relative: 31 %
Lymphs Abs: 2 10*3/uL (ref 0.7–4.0)
MCH: 28.5 pg (ref 26.0–34.0)
MCHC: 33.3 g/dL (ref 30.0–36.0)
MCV: 85.4 fL (ref 80.0–100.0)
Monocytes Absolute: 0.4 10*3/uL (ref 0.1–1.0)
Monocytes Relative: 7 %
Neutro Abs: 4 10*3/uL (ref 1.7–7.7)
Neutrophils Relative %: 61 %
Platelet Count: 395 10*3/uL (ref 150–400)
RBC: 4.04 MIL/uL (ref 3.87–5.11)
RDW: 12.6 % (ref 11.5–15.5)
WBC Count: 6.5 10*3/uL (ref 4.0–10.5)
nRBC: 0 % (ref 0.0–0.2)

## 2020-03-23 LAB — CMP (CANCER CENTER ONLY)
ALT: 15 U/L (ref 0–44)
AST: 19 U/L (ref 15–41)
Albumin: 3.9 g/dL (ref 3.5–5.0)
Alkaline Phosphatase: 77 U/L (ref 38–126)
Anion gap: 9 (ref 5–15)
BUN: 10 mg/dL (ref 6–20)
CO2: 27 mmol/L (ref 22–32)
Calcium: 10 mg/dL (ref 8.9–10.3)
Chloride: 106 mmol/L (ref 98–111)
Creatinine: 0.75 mg/dL (ref 0.44–1.00)
GFR, Estimated: >60 mL/min (ref >60)
Glucose, Bld: 98 mg/dL (ref 70–99)
Potassium: 3.2 mmol/L — ABNORMAL LOW (ref 3.5–5.1)
Sodium: 142 mmol/L (ref 135–145)
Total Bilirubin: 0.2 mg/dL — ABNORMAL LOW (ref 0.3–1.2)
Total Protein: 7.1 g/dL (ref 6.5–8.1)

## 2020-03-23 LAB — LACTATE DEHYDROGENASE: LDH: 171 U/L (ref 98–192)

## 2020-03-23 MED ORDER — ACETAMINOPHEN 325 MG PO TABS
ORAL_TABLET | ORAL | Status: AC
  Filled 2020-03-23: qty 2

## 2020-03-23 MED ORDER — DIPHENHYDRAMINE HCL 25 MG PO CAPS
50.0000 mg | ORAL_CAPSULE | Freq: Once | ORAL | Status: AC
  Administered 2020-03-23: 50 mg via ORAL

## 2020-03-23 MED ORDER — DIPHENHYDRAMINE HCL 25 MG PO CAPS
ORAL_CAPSULE | ORAL | Status: AC
  Filled 2020-03-23: qty 1

## 2020-03-23 MED ORDER — FAMOTIDINE IN NACL 20-0.9 MG/50ML-% IV SOLN
INTRAVENOUS | Status: AC
  Filled 2020-03-23: qty 50

## 2020-03-23 MED ORDER — SODIUM CHLORIDE 0.9 % IV SOLN
10.0000 mg | Freq: Once | INTRAVENOUS | Status: AC
  Administered 2020-03-23: 10 mg via INTRAVENOUS
  Filled 2020-03-23: qty 10

## 2020-03-23 MED ORDER — ACETAMINOPHEN 325 MG PO TABS
650.0000 mg | ORAL_TABLET | Freq: Once | ORAL | Status: AC
  Administered 2020-03-23: 650 mg via ORAL

## 2020-03-23 MED ORDER — SODIUM CHLORIDE 0.9 % IV SOLN
Freq: Once | INTRAVENOUS | Status: AC
  Filled 2020-03-23: qty 250

## 2020-03-23 MED ORDER — SODIUM CHLORIDE 0.9 % IV SOLN
375.0000 mg/m2 | Freq: Once | INTRAVENOUS | Status: AC
  Administered 2020-03-23: 700 mg via INTRAVENOUS
  Filled 2020-03-23: qty 50

## 2020-03-23 MED ORDER — FAMOTIDINE IN NACL 20-0.9 MG/50ML-% IV SOLN
20.0000 mg | Freq: Once | INTRAVENOUS | Status: AC
  Administered 2020-03-23: 20 mg via INTRAVENOUS

## 2020-03-23 NOTE — Patient Instructions (Signed)
[facility] Discharge Instructions for Patients Receiving Chemotherapy  Today you received the following chemotherapy agents:  Rituxan   To help prevent nausea and vomiting after your treatment, we encourage you to take your nausea medication as prescribed.   If you develop nausea and vomiting that is not controlled by your nausea medication, call the clinic.   BELOW ARE SYMPTOMS THAT SHOULD BE REPORTED IMMEDIATELY:  *FEVER GREATER THAN 100.5 F  *CHILLS WITH OR WITHOUT FEVER  NAUSEA AND VOMITING THAT IS NOT CONTROLLED WITH YOUR NAUSEA MEDICATION  *UNUSUAL SHORTNESS OF BREATH  *UNUSUAL BRUISING OR BLEEDING  TENDERNESS IN MOUTH AND THROAT WITH OR WITHOUT PRESENCE OF ULCERS  *URINARY PROBLEMS  *BOWEL PROBLEMS  UNUSUAL RASH Items with * indicate a potential emergency and should be followed up as soon as possible.  Feel free to call the clinic should you have any questions or concerns. The clinic phone number is [phone].  Please show the CHEMO ALERT CARD at check-in to the Emergency Department and triage nurse.   

## 2020-03-23 NOTE — Progress Notes (Signed)
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 female who has been referred to [place] by my colleague Dr. Cecil Cobbs in Neuro-Oncology for evaluation and management of her Newly diagnosed Extranodal Marginal Zone Lymphoma. She is accompanied today by her sister [name] via [employer]. The pt reports that she is doing well overall.  Prior to today's visit, the pt presented to the ED on 07/21/18 with complaints of a headache for the past month which presented intermittently and was worsened by bending, coughing, or laughing. She had a CT Head and MRI Brain which revealed extensive dural thickening, as noted below. She then had a right posterior fossa craniectomy on 07/28/18 for open exicisional biopsies of the right cerebellar hemisphere and dura which revealed Extranodal marginal zone lymphoma. The pt then established care with my colleague Dr. Cecil Cobbs in Neuro-Onc on 08/11/18. She has been taking 4mg  Decadron daily.  The pt reports that developed worsened headaches about a month ago, and notes that when she laughed it felt like "electiricty" was running through the sides and back of her head. She notes that some mild headaches had begun a couple months ago. Denies double vision, changes in vision, loss of balance, or other senses being affected. The pt notes that in addition to this, she developed redness in the eyes in the past couple months, saw eye doctor and was prescribed eye drops. Denies other concerns in the last 6 months including bone pains, fatigue, fevers, chills, night sweats or unexpected weight loss. She notes that her headaches have resolved. She notes she began Decadron after her biopsy. She denies having "any symptoms  whatsoever," currently.   Of note prior to the patient's visit today, pt has had an MRI Brain completed on 07/21/18 with results revealing "Extensive dural thickening surrounding the right cerebellar hemisphere with mass-effect and edema in the right cerebellum. The process appears to extend into the upper cervical canal. Mild obstructive hydrocephalus. Differential includes tumor including metastatic disease and lymphoma. Chronic inflammatory process such as sarcoid is a consideration however chest x-ray negative. TB and atypical infection/fungus also possible. 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  On review of systems, pt reports good energy levels, headache resolution, and denies joint issues, skin rashes, dry mouth, dry eyes, fevers, chills, night sweats, bone pains, unexpected weight loss, fatigue, double vision, loss of vision, affected senses, loss of balance, and any other symptoms.   On PMHx the pt reports sickle cel trait, HTN, hyperlipidemia. On Social Hx the pt reports that she smoked 1 ppd for the last 42 years, and reports having quit two days ago. 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 On Family Hx the pt reports dad with lupus and sister with sarcoidosis. Daughter and son with sickle cell disease. Denies other blood disorders or cancers. Endorses Penicillin allergy  Interval History:   Amber Drake returns today for management and evaluation of her CNS Extranodal Marginal Zone  Lymphoma. The patient's last visit with [place] was on 12/16/2019. The pt reports that she is doing well overall.  The pt reports that she has felt well and denies any new concerns. She is scheduled to receive her Moderna booster next week.  Pt has continued taking the Potassium supplement daily. Pt denies any recent infections.   Lab results today (03/23/20) of CBC w/diff and CMP is as follows: all values are WNL except for Hgb at 11.5, HCT at 34.5, Potassium at 3.2, Total Bilirubin at 0.2. 03/23/2020 LDH at 171  On review of systems, pt denies headaches, vision changes, neck pain, back pain, fevers, chills, night sweats, unexpected weight loss, low appetite, rash and any other symptoms.   MEDICAL HISTORY:  Past Medical History:  Diagnosis Date  . Anemia   . Arthritis   . Blood dyscrasia    sickle cell trait  . Cancer ([place])    lymphoma  . Carbuncle and furuncle   . Hypercholesterolemia   . Hypertension    does not take meds  . Sickle cell trait ([place])     SURGICAL HISTORY: Past Surgical History:  Procedure Laterality Date  . APPENDECTOMY    . APPLICATION OF CRANIAL NAVIGATION N/A 07/28/2018   Procedure: APPLICATION OF CRANIAL NAVIGATION;  Surgeon: Kary Kos, MD;  Location: [facility];  Service: Neurosurgery;  Laterality: N/A;  . BRAIN SURGERY    . CARPAL TUNNEL RELEASE  03/23/2011   Procedure: CARPAL TUNNEL RELEASE;  Surgeon: Sanjuana Kava;  Location: [facility];  Service: Orthopedics;  Laterality: Left;  . CARPAL TUNNEL RELEASE  05/10/2011   Procedure: CARPAL TUNNEL RELEASE;  Surgeon: Sanjuana Kava, MD;  Location: [facility];  Service: Orthopedics;  Laterality: Right;  . CESAREAN SECTION     x 2  . INCISION AND DRAINAGE PERIRECTAL ABSCESS  12/21/09  . PR DURAL GRAFT REPAIR,SPINE DEFECT N/A 07/28/2018   Procedure: Stereotactic open biopsy of Right cerebellar hemisphere and dura with brainlab;  Surgeon: Kary Kos, MD;  Location: [facility];  Service: Neurosurgery;  Laterality: N/A;  Stereotactic open biopsy of Right cerebellar hemisphere and dura with brainlab  . PROCTOSCOPY  10/17/2010   Procedure: PROCTOSCOPY;  Surgeon: Scherry Ran;  Location: [facility];  Service: General;  Laterality: N/A;  Rigid Proctoscopy/Possible Fistula in  Ano  Procedure ended at 1003  . THERAPEUTIC ABORTION     x2    SOCIAL HISTORY: Social History   Socioeconomic History  . Marital status: Married    Spouse name: Not on file  . Number of children: Not on file  . Years of education: Not on file  . Highest education level: Not on file  Occupational History  . Not on file  Tobacco Use  . Smoking status: Current Every Day Smoker    Packs/day: 0.50    Years: 30.00    Pack years: 15.00    Types: Cigarettes  . Smokeless tobacco: Never Used  Vaping Use  . Vaping Use: Never used  Substance and Sexual Activity  . Alcohol use: No  . 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 Sexual activity: Yes    Partners: Male    Birth control/protection: None  Other Topics Concern  . Not on file  Social History Narrative  . Not on file   Social Determinants of Health   Financial Resource Strain: Not on file  Food Insecurity: Not on file  Transportation Needs: Not on file  Physical Activity: Not on  file  Stress: Not on file  Social Connections: Not on file  Intimate Partner Violence: Not on file    FAMILY HISTORY: Family History  Problem Relation Age of Onset  . Kidney failure Daughter   . Sickle cell anemia Daughter   . Sickle cell trait Daughter   . Sickle cell anemia Son   . Hypertension Mother   . Hyperlipidemia Mother   . Hypertension Father   . Lupus Father        skin  . Hypertension Other   . Sarcoidosis Sister   . Anesthesia problems Neg Hx   . Hypotension Neg Hx   . Malignant hyperthermia Neg Hx   . Pseudochol deficiency Neg Hx     ALLERGIES:  is allergic to penicillins, penicillins cross reactors, and tape.  MEDICATIONS:  Current Outpatient Medications  Medication Sig Dispense Refill  . albuterol (PROAIR HFA) 108 (90 Base) MCG/ACT inhaler Inhale 2 puffs into the lungs 4 (four) times daily as needed. (Patient not taking: Reported on 10/13/2019)    . atorvastatin (LIPITOR) 20 MG tablet Take 1 tablet  (20 mg total) by mouth daily. 90 tablet 2  . cephALEXin (KEFLEX) 500 MG capsule Take 1 capsule (500 mg total) by mouth 3 (three) times daily. 21 capsule 0  . clindamycin (CLINDAGEL) 1 % gel Apply topically 2 (two) times daily. To boils in the groin 30 g 1  . cyclobenzaprine (FLEXERIL) 10 MG tablet Take 1 tablet (10 mg total) by mouth 3 (three) times daily as needed for muscle spasms. (Patient not taking: Reported on 05/20/2019) 40 tablet 0  . dexamethasone (DECADRON) 1 MG tablet 1mg  po daily with breakfast till 8/24 then 1mg  po every other day for 10 days then stop (Patient not taking: Reported on 10/13/2019) 30 tablet 0  . doxycycline (VIBRAMYCIN) 100 MG capsule Take 1 capsule (100 mg total) by mouth 2 (two) times daily. 20 capsule 0  . ferrous sulfate 325 (65 FE) MG tablet Take 325 mg by mouth daily with breakfast. (Patient not taking: Reported on 10/13/2019)    . ibuprofen (ADVIL) 800 MG tablet Take 1 tablet (800 mg total) by mouth every 8 (eight) hours as needed. 20 tablet 1  . lisinopril-hydrochlorothiazide (PRINZIDE,ZESTORETIC) 20-12.5 MG tablet Take 1 tablet by mouth daily.    . Multiple Vitamin (MULTIVITAMIN WITH MINERALS) TABS tablet Take 1 tablet by mouth daily.    . potassium chloride SA (KLOR-CON) 20 MEQ tablet TAKE (1) TABLET BY MOUTH TWICE DAILY. 60 tablet 0  . predniSONE (DELTASONE) 20 MG tablet 3 po once a day for 2 days, then 2 po once a day for 3 days, then 1 po once a day for 3 days (Patient not taking: Reported on 10/13/2019) 15 tablet 0  . ranitidine (ZANTAC) 150 MG capsule Take 150 mg by mouth 2 (two) times daily. (Patient not taking: Reported on 10/13/2019)    . traMADol (ULTRAM) 50 MG tablet Take 1 tablet (50 mg total) by mouth every 6 (six) hours as needed for moderate pain or severe pain. (Patient not taking: Reported on 10/13/2019) 30 tablet 0  . Vitamins/Minerals TABS Take by mouth. (Patient not taking: Reported on 10/13/2019)     No current facility-administered medications for  this visit.   Facility-Administered Medications Ordered in Other Visits  Medication Dose Route Frequency Provider Last Rate Last Admin  . acetaminophen (TYLENOL) tablet 500 mg  500 mg Oral Once Brunetta Genera, MD       And  .  oxyCODONE (Oxy IR/ROXICODONE) immediate release tablet 5 mg  5 mg Oral Once Brunetta Genera, MD      . dexamethasone (DECADRON) 10 mg in sodium chloride 0.9 % 50 mL IVPB  10 mg Intravenous Once Brunetta Genera, MD        REVIEW OF SYSTEMS:   A 10+ POINT REVIEW OF SYSTEMS WAS OBTAINED including neurology, dermatology, psychiatry, cardiac, respiratory, lymph, extremities, GI, GU, Musculoskeletal, constitutional, breasts, reproductive, HEENT.  All pertinent positives are noted in the HPI.  All others are negative.   PHYSICAL EXAMINATION: ECOG PERFORMANCE STATUS: 1 - Symptomatic but completely ambulatory  . Vitals:   03/23/20 0949  BP: 135/67  Pulse: 70  Resp: 18  Temp: (!) 96.9 F (36.1 C)  SpO2: 100%   Filed Weights   03/23/20 0949  Weight: 172 lb 4.8 oz (78.2 kg)   .Body mass index is 29.58 kg/m.   GENERAL:alert, in no acute distress and comfortable SKIN: no acute rashes, no significant lesions EYES: conjunctiva are pink and non-injected, sclera anicteric OROPHARYNX: MMM, no exudates, no oropharyngeal erythema or ulceration NECK: supple, no JVD LYMPH:  no palpable lymphadenopathy in the cervical, axillary or inguinal regions LUNGS: clear to auscultation b/l with normal respiratory effort HEART: regular rate & rhythm ABDOMEN:  normoactive bowel sounds , non tender, not distended. No palpable hepatosplenomegaly.  Extremity: no pedal edema PSYCH: alert & oriented x 3 with fluent speech NEURO: no focal motor/sensory deficits  LABORATORY DATA:  I have reviewed the data as listed  . CBC Latest Ref Rng & Units 03/23/2020 01/26/2020 11/25/2019  WBC 4.0 - 10.5 K/uL 6.5 6.4 6.4  Hemoglobin 12.0 - 15.0 g/dL 11.5(L) 12.0 11.8(L)  Hematocrit  36.0 - 46.0 % 34.5(L) 36.0 34.7(L)  Platelets 150 - 400 K/uL 395 411(H) 364    . CMP Latest Ref Rng & Units 03/23/2020 01/26/2020 11/25/2019  Glucose 70 - 99 mg/dL 98 82 117(H)  BUN 6 - 20 mg/dL 10 10 16   Creatinine 0.44 - 1.00 mg/dL 0.75 0.73 0.82  Sodium 135 - 145 mmol/L 142 140 142  Potassium 3.5 - 5.1 mmol/L 3.2(L) 3.4(L) 3.1(L)  Chloride 98 - 111 mmol/L 106 106 107  CO2 22 - 32 mmol/L 27 26 27   Calcium 8.9 - 10.3 mg/dL 10.0 10.0 10.6(H)  Total Protein 6.5 - 8.1 g/dL 7.1 6.8 6.9  Total Bilirubin 0.3 - 1.2 mg/dL 0.2(L) <0.2(L) 0.3  Alkaline Phos 38 - 126 U/L 77 72 77  AST 15 - 41 U/L 19 16 19   ALT 0 - 44 U/L 15 13 20     09/12/18 BM Biopsy:    07/28/18 Right cerebellar hemisphere and dura biopsy:     RADIOGRAPHIC STUDIES: I have personally reviewed the radiological images as listed and agreed with the findings in the report. No results found.  ASSESSMENT & PLAN:  59 y.o. female with  1. 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 The process appears to extend into the upper cervical canal. Mild obstructive hydrocephalus. Differential includes tumor including metastatic disease and lymphoma. Chronic inflammatory process such as sarcoid is a consideration however chest x-ray negative. TB  and atypical infection/fungus also possible. 6 mm colloid cyst felt to be a separate problem."  07/25/18 CT C/A/P which did not reveal significant abnormality  09/12/18 BM Bx revealed no evidence of lymphoma  09/08/18 PET/CT revealed "No hypermetabolic mass  or adenopathy identified within the chest abdomen or pelvis to suggest metabolically active tumor. 2. Nonspecific focus of increased uptake within the cord extending from T12 to L1. Cannot rule out additional site of CNS lymphoma. Consider further evaluation with contrast enhanced MRI through this area. 3. Aortic Atherosclerosis and Emphysema."  10/16/2018 MRI brain w and w/o contrast revealed "1. Resolved dural mass in the right posterior fossa with minimal smooth dural thickening that may be treatment related. Cerebellar edema and mass effect is also resolved. No new site of disease. 2. 6 mm colloid cyst."  10/20/2018 MRI cervical spine w and w/o contrast revealed "Negative for lymphoma. No acute abnormality. Central disc protrusion at C5-6 effaces the ventral thecal sac causing mild central canal narrowing. There is also a shallow disc bulge at C6-7 which narrows but does not efface the ventral thecal Sac."  01/13/2019 MRI Brain ([id number]) revealed "1. Stable and satisfactory post treatment appearance of the posterior fossa. 2. Stable small 6 mm colloid cyst. 3. No new intracranial abnormality."  05/06/2019 MRI Cervical Spine ([id number]) revealed "No evidence of lymphoma in the cervical spine. Previously noted dural enhancement in the posterior fossa and cervical canal has resolved. No cord compression or cord lesion. Chronic cervical spine degenerative changes are stable from the prior study."  12/15/2019 MRI Brain ([id number]) revealed "No acute intracranial abnormality. No enhancing mass lesion 5 mm colloid cyst in the third ventricle is chronic and unchanged from prior studies."  PLAN: -Discussed pt labwork today, 03/23/20; blood counts and chemistries are stable, mild hypokalemia. -Advised pt that there is no lab or clinical evidence of CNS Extranodal Marginal Zone Lymphoma recurrence at this time. -Will continue maintenance Rituxan q8weeks for up to three years. The pt has no  prohibitive toxicities.  -Discussed CDC guidelines regarding the COVID19 booster. Recommend pt seek the Moderna booster. -Continue to avoid Ibuprofen, use OTC Tylenol for pain management.  -Will repeat MRI Brain 6 months from last scan. -Recommend pt increase dietary Potassium  -Will see back in 2 months with labs   FOLLOW UP: -Plz schedule next 3 cycles of maintenance Rituxan (every 60 days) with labs and MD visit   The total time spent in the appt was 30 minutes and more than 50% was on counseling and direct patient cares, ordering and management of chemotherapy.  All of the patient's questions were answered with apparent satisfaction. The patient knows to call the clinic with any problems, questions or concerns.    Sullivan Lone MD [place] AAHIVMS [facility] Hematology/Oncology Physician [facility]  (Office):       [phone] (Work cell):  [phone] (Fax):           [phone]  03/23/2020 11:11 AM  I, Yevette Edwards, am acting as a scribe for Dr. Sullivan Lone.   .I have reviewed the above documentation for accuracy and completeness, and I agree with the above. Brunetta Genera MD

## 2020-03-24 ENCOUNTER — Telehealth: Payer: Self-pay | Admitting: Hematology

## 2020-03-24 NOTE — Telephone Encounter (Signed)
Scheduled per 12/22 los, patient has been called but no voicemail set up. Calender will be mailed.

## 2020-03-31 ENCOUNTER — Other Ambulatory Visit: Payer: Medicaid Other | Admitting: Adult Health

## 2020-04-09 ENCOUNTER — Other Ambulatory Visit: Payer: Medicaid Other

## 2020-04-09 ENCOUNTER — Other Ambulatory Visit: Payer: Self-pay

## 2020-04-09 DIAGNOSIS — Z20822 Contact with and (suspected) exposure to covid-19: Secondary | ICD-10-CM

## 2020-04-14 LAB — NOVEL CORONAVIRUS, NAA: SARS-CoV-2, NAA: DETECTED — AB

## 2020-04-15 ENCOUNTER — Telehealth: Payer: Self-pay

## 2020-04-15 NOTE — Telephone Encounter (Signed)
Called to discuss with patient about COVID-19 symptoms and the use of one of the available treatments for those with mild to moderate Covid symptoms and at a high risk of hospitalization.  Pt appears to qualify for outpatient treatment due to co-morbid conditions and/or a member of an at-risk group in accordance with the FDA Emergency Use Authorization.    Symptom onset: 04/09/20 Vaccinated: Yes Booster? No Immunocompromised? No Qualifiers: HTN  Unable to reach pt - Reached pt. Declines ant other treatment.   Amber Drake

## 2020-05-09 ENCOUNTER — Telehealth: Payer: Self-pay | Admitting: Hematology

## 2020-05-09 NOTE — Telephone Encounter (Signed)
Called pt per 2/7 sch msg - no answer and no vmail set up

## 2020-05-17 ENCOUNTER — Emergency Department (HOSPITAL_COMMUNITY)
Admission: EM | Admit: 2020-05-17 | Discharge: 2020-05-17 | Disposition: A | Discharge status: Home / Self Care | Payer: Commercial Managed Care - PPO | Attending: Emergency Medicine | Admitting: Emergency Medicine

## 2020-05-17 ENCOUNTER — Other Ambulatory Visit: Payer: Self-pay

## 2020-05-17 ENCOUNTER — Encounter (HOSPITAL_COMMUNITY): Payer: Self-pay | Admitting: *Deleted

## 2020-05-17 ENCOUNTER — Emergency Department (HOSPITAL_COMMUNITY): Payer: Commercial Managed Care - PPO

## 2020-05-17 DIAGNOSIS — M545 Low back pain, unspecified: Secondary | ICD-10-CM | POA: Diagnosis present

## 2020-05-17 DIAGNOSIS — I10 Essential (primary) hypertension: Secondary | ICD-10-CM | POA: Diagnosis not present

## 2020-05-17 DIAGNOSIS — F17210 Nicotine dependence, cigarettes, uncomplicated: Secondary | ICD-10-CM | POA: Insufficient documentation

## 2020-05-17 DIAGNOSIS — Z8572 Personal history of non-Hodgkin lymphomas: Secondary | ICD-10-CM | POA: Insufficient documentation

## 2020-05-17 DIAGNOSIS — M5431 Sciatica, right side: Secondary | ICD-10-CM

## 2020-05-17 DIAGNOSIS — M5432 Sciatica, left side: Secondary | ICD-10-CM

## 2020-05-17 DIAGNOSIS — Z79899 Other long term (current) drug therapy: Secondary | ICD-10-CM | POA: Diagnosis not present

## 2020-05-17 DIAGNOSIS — M5430 Sciatica, unspecified side: Secondary | ICD-10-CM | POA: Diagnosis not present

## 2020-05-17 LAB — URINALYSIS, ROUTINE W REFLEX MICROSCOPIC
Bilirubin Urine: NEGATIVE
Glucose, UA: NEGATIVE mg/dL
Ketones, ur: NEGATIVE mg/dL
Leukocytes,Ua: NEGATIVE
Nitrite: NEGATIVE
Protein, ur: NEGATIVE mg/dL
Specific Gravity, Urine: 1.01 (ref 1.005–1.030)
pH: 7 (ref 5.0–8.0)

## 2020-05-17 IMAGING — DX DG LUMBAR SPINE 2-3V
3 series · 3 of 3 positions shown · non-contrast
Comparison: None.

CLINICAL DATA: History of lymphoma, back pain

EXAM:
LUMBAR SPINE - 2-3 VIEW

[l-spine ap]
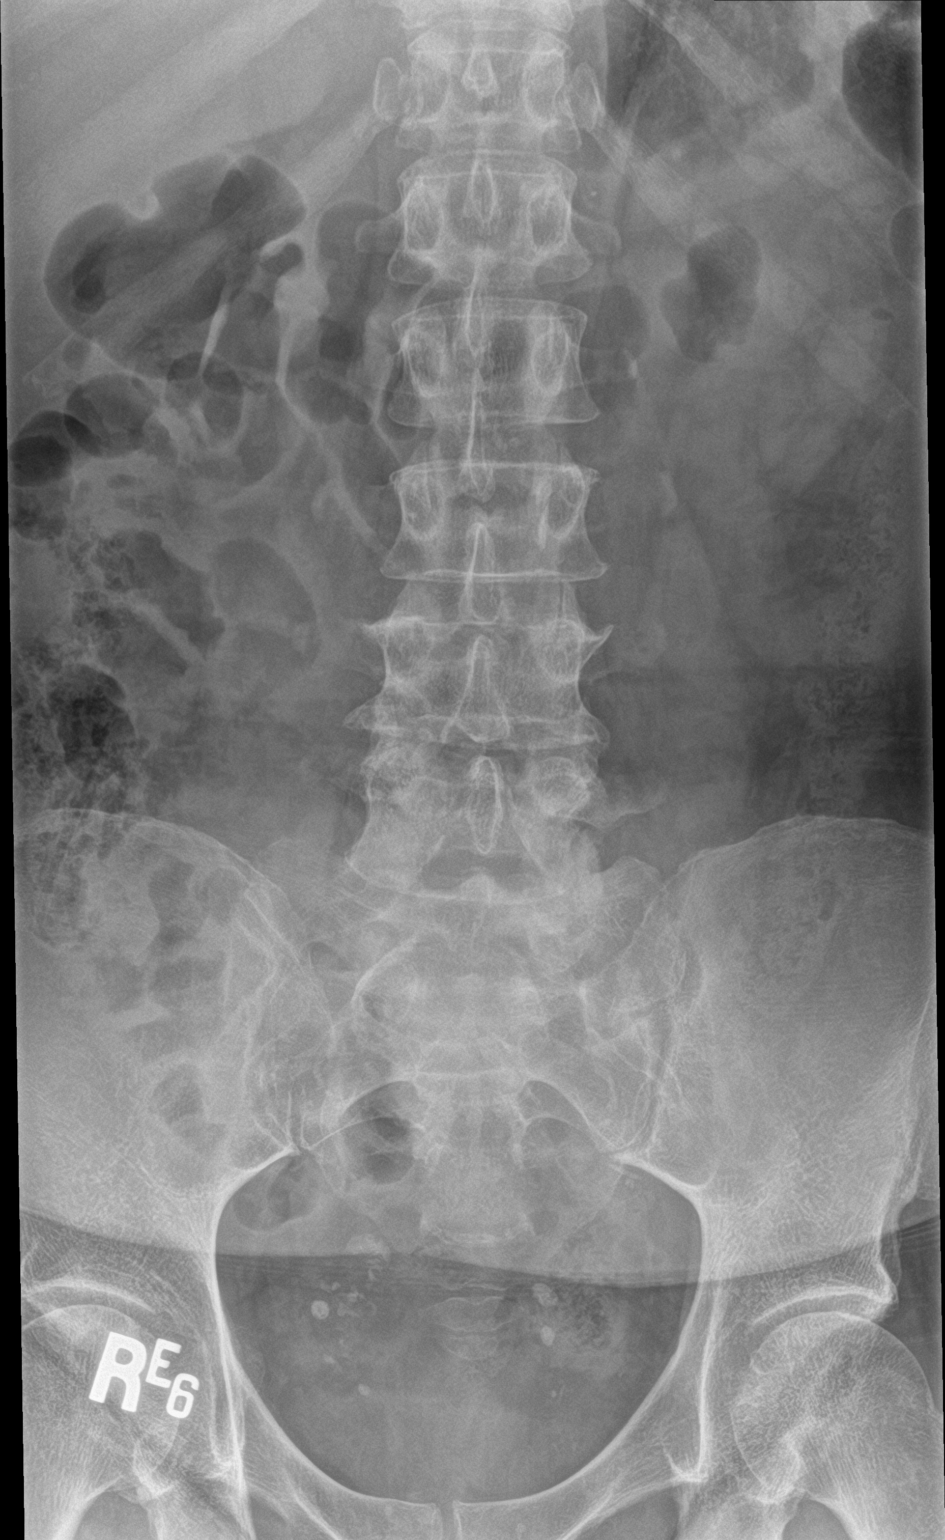

[l-spine lat]
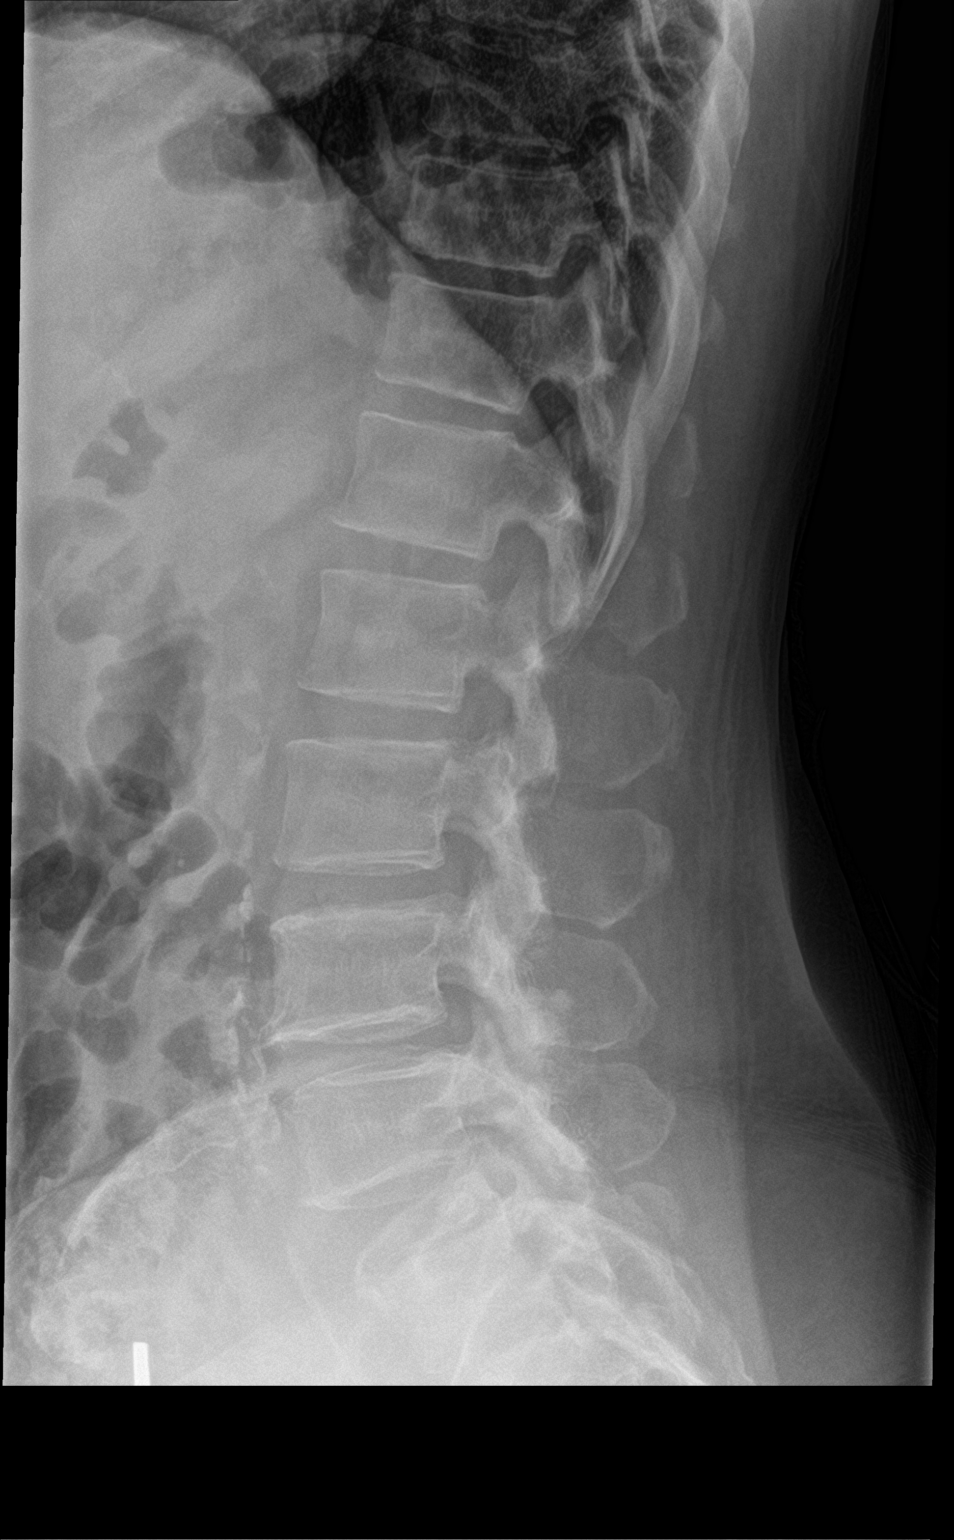

[l-spine spot]
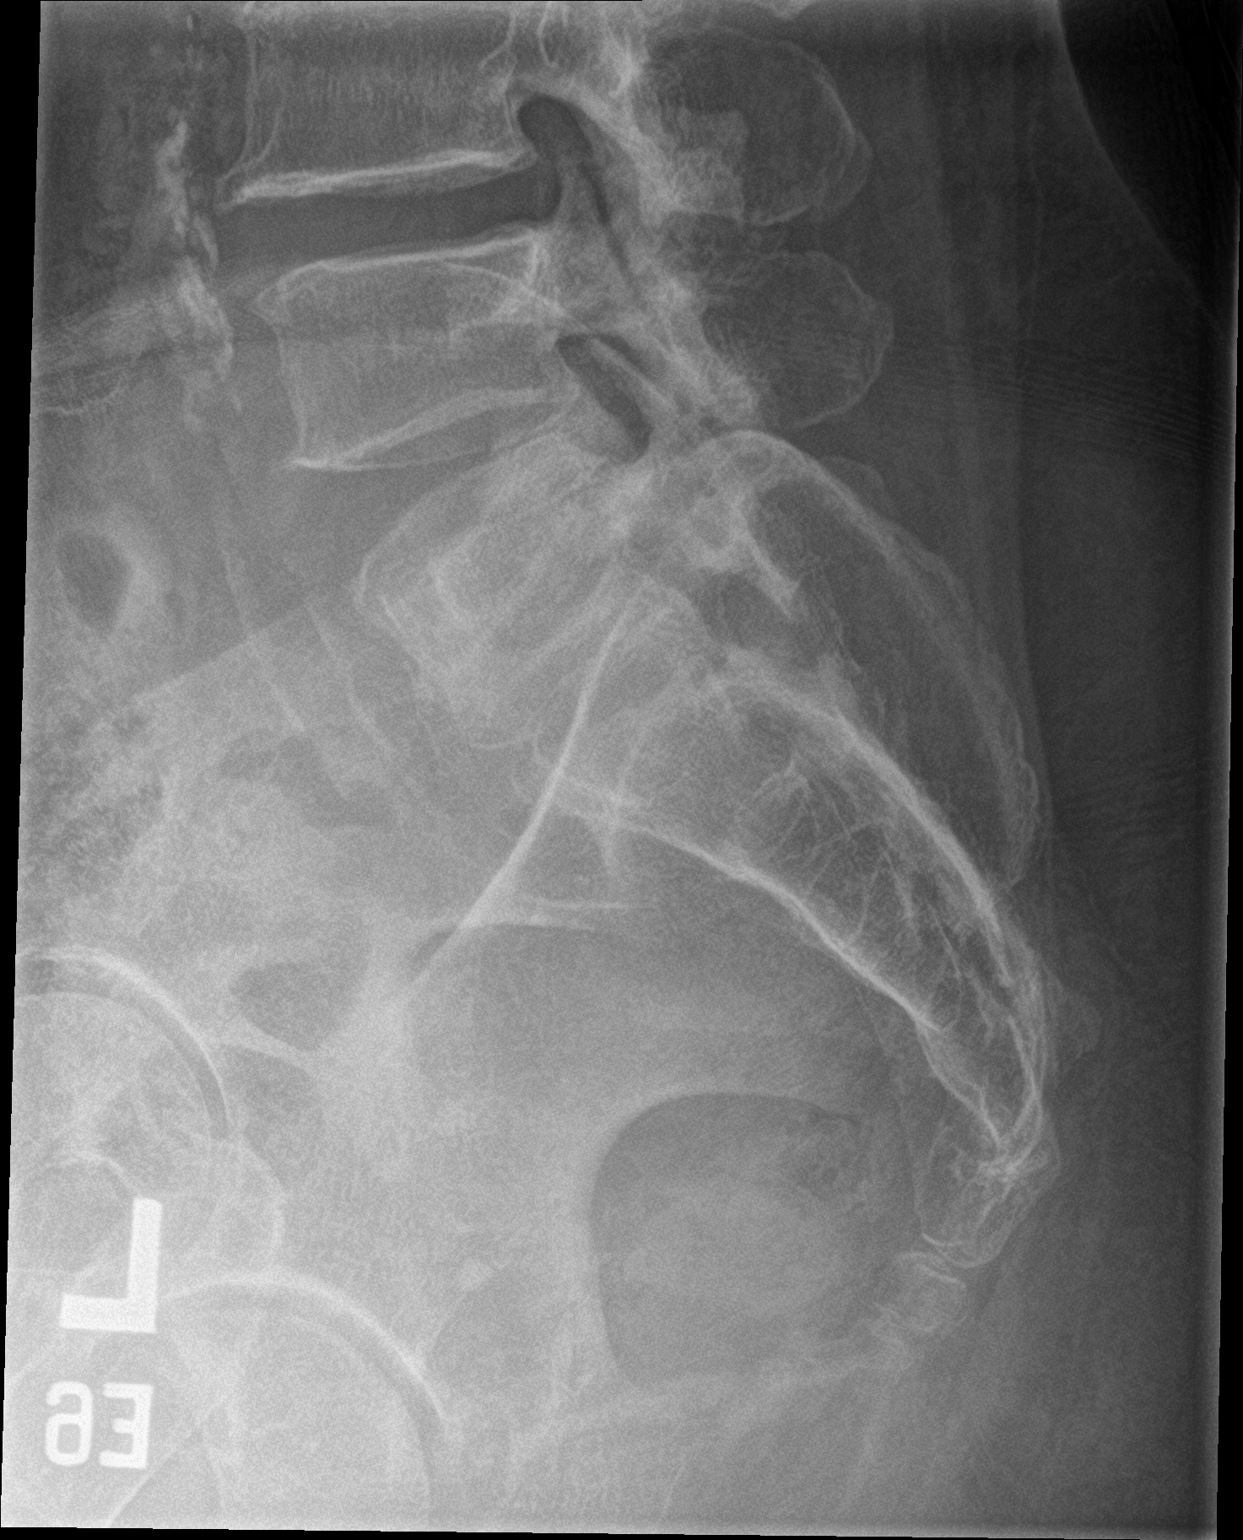

[3 of 3 positions shown; findings below may reference images not displayed]

FINDINGS: Degenerative facet disease throughout the lumbar spine, most
pronounced in the lower lumbar spine. Disc spaces are maintained.
Early anterior degenerative spurring in the lower lumbar spine.
Normal alignment. No fracture. SI joints symmetric and unremarkable.
Aortic atherosclerosis. No visible aneurysm.
IMPRESSION: Degenerative changes.  No acute bony abnormality.

## 2020-05-17 MED ORDER — PREDNISONE 50 MG PO TABS
60.0000 mg | ORAL_TABLET | Freq: Once | ORAL | Status: AC
  Administered 2020-05-17: 60 mg via ORAL
  Filled 2020-05-17: qty 1

## 2020-05-17 MED ORDER — HYDROCODONE-ACETAMINOPHEN 5-325 MG PO TABS: 1.0000 | ORAL_TABLET | ORAL | 0 refills | Status: DC | PRN

## 2020-05-17 MED ORDER — PREDNISONE 10 MG PO TABS: ORAL_TABLET | ORAL | 0 refills | Status: DC

## 2020-05-17 NOTE — ED Provider Notes (Signed)
[facility] EMERGENCY DEPARTMENT Provider Note   CSN: [id number] Arrival date & time: 05/17/20  1121     History Chief Complaint  Patient presents with  . Back Pain    Amber Drake is a 60 y.o. female with a history of lymphoma, sickle cell trait and occasional episodes of low back pain with sciatica, presenting with a 2 day history of bilateral low back pain with radicular symptoms to her bilateral lower buttock, upper posterior thigh region, described as an aching pain.  She denies weakness or numbness in her legs, no fevers, chills, no dysuria or retention but does not increased urinary frequency. She has taken ibuprofen without relief of pain.     The history is provided by the patient.       Past Medical History:  Diagnosis Date  . Anemia   . Arthritis   . Blood dyscrasia    sickle cell trait  . Cancer ([place])    lymphoma  . Carbuncle and furuncle   . Hypercholesterolemia   . Hypertension    does not take meds  . Sickle cell trait [facility])     Patient Active Problem List   Diagnosis Date Noted  . 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 Counseling regarding advance care planning and goals of care 10/23/2018  . 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 Brain tumor ([place]) 07/28/2018  . Hidradenitis 07/31/2016  . Recurrent boils 07/31/2016  . Hyperlipidemia 06/15/2015  . Pain of left hand 05/19/2015  . Hand pain, right 05/19/2015  . Hematuria 03/03/2015  . Esophageal reflux 03/03/2015  . Essential hypertension, benign 03/03/2015  . Bronchitis due to tobacco use 10/17/2010    Past Surgical History:  Procedure Laterality Date  . APPENDECTOMY    . APPLICATION OF CRANIAL NAVIGATION N/A 07/28/2018   Procedure: APPLICATION OF CRANIAL NAVIGATION;  Surgeon: Kary Kos, MD;  Location: [place];  Service: Neurosurgery;  Laterality: N/A;  . BRAIN SURGERY    . CARPAL TUNNEL RELEASE  03/23/2011   Procedure: CARPAL TUNNEL RELEASE;  Surgeon: Sanjuana Kava;  Location: [facility];  Service: Orthopedics;  Laterality: Left;  . CARPAL TUNNEL RELEASE  05/10/2011   Procedure: CARPAL TUNNEL RELEASE;  Surgeon: Sanjuana Kava, MD;  Location: [facility];  Service: Orthopedics;  Laterality: Right;  . CESAREAN SECTION     x 2  . INCISION AND DRAINAGE PERIRECTAL ABSCESS  12/21/09  . PR DURAL GRAFT REPAIR,SPINE DEFECT N/A 07/28/2018   Procedure: Stereotactic open biopsy of Right cerebellar hemisphere and dura with brainlab;  Surgeon: Kary Kos, MD;  Location: [place];  Service: Neurosurgery;  Laterality: N/A;  Stereotactic open biopsy of Right cerebellar hemisphere and dura with brainlab  . PROCTOSCOPY  10/17/2010   Procedure: PROCTOSCOPY;  Surgeon: Scherry Ran;  Location: [facility];  Service: General;  Laterality: N/A;  Rigid Proctoscopy/Possible Fistula in Ano  Procedure ended at 1003  . THERAPEUTIC ABORTION     x2     OB History    Gravida  4   Para  2   Term  2   Preterm      AB  2   Living  2     SAB      IAB      Ectopic      Multiple      Live Births  2           Family History  Problem Relation Age of Onset  .  Kidney failure Daughter   . Sickle cell anemia Daughter   . Sickle cell trait Daughter   . Sickle cell anemia Son   . Hypertension Mother   . Hyperlipidemia Mother   . Hypertension Father   . Lupus Father        skin  . Hypertension Other   . Sarcoidosis Sister   . Anesthesia problems Neg Hx   . Hypotension Neg Hx   . Malignant hyperthermia Neg Hx   . Pseudochol deficiency Neg Hx     Social History   Tobacco Use  . Smoking status: Current Every Day Smoker    Packs/day: 0.50    Years: 30.00    Pack years: 15.00    Types: Cigarettes  . Smokeless tobacco: Never Used  Vaping Use  . Vaping Use: Never used  Substance Use Topics  . Alcohol use: No  . 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  Authorizing Provider  HYDROcodone-acetaminophen (NORCO/VICODIN) 5-325 MG tablet Take 1 tablet by mouth every 4 (four) hours as needed for moderate pain. 05/17/20  Yes Walter Min, Almyra Free, PA-C  predniSONE (DELTASONE) 10 MG tablet 6, 5, 4, 3, 2 then 1 tablet by mouth daily for 6 days total. 05/17/20  Yes Roan Miklos, Almyra Free, PA-C  albuterol (PROAIR HFA) 108 (90 Base) MCG/ACT inhaler Inhale 2 puffs into the lungs 4 (four) times daily as needed. Patient not taking: Reported on 10/13/2019 02/25/17   [provider]  atorvastatin (LIPITOR) 20 MG tablet Take 1 tablet (20 mg total) by mouth daily. 03/14/15   Soyla Dryer, PA-C  cephALEXin (KEFLEX) 500 MG capsule Take 1 capsule (500 mg total) by mouth 3 (three) times daily. 10/22/19   Veryl Speak, MD  clindamycin (CLINDAGEL) 1 % gel Apply topically 2 (two) times daily. To boils in the groin 07/22/19   Brunetta Genera, MD  cyclobenzaprine (FLEXERIL) 10 MG tablet Take 1 tablet (10 mg total) by mouth 3 (three) times daily as needed for muscle spasms. Patient not taking: Reported on 05/20/2019 11/07/16   Sanjuana Kava, MD  dexamethasone (DECADRON) 1 MG tablet 1mg  po daily with breakfast till 8/24 then 1mg  po every other day for 10 days then stop Patient not taking: Reported on 10/13/2019 11/17/18   Brunetta Genera, MD  doxycycline (VIBRAMYCIN) 100 MG capsule Take 1 capsule (100 mg total) by mouth 2 (two) times daily. 11/04/19   Triplett, Tammy, PA-C  ferrous sulfate 325 (65 FE) MG tablet Take 325 mg by mouth daily with breakfast. Patient not taking: Reported on 10/13/2019    [provider]  ibuprofen (ADVIL) 800 MG tablet Take 1 tablet (800 mg total) by mouth every 8 (eight) hours as needed. 10/13/19   Milton Ferguson, MD  lisinopril-hydrochlorothiazide (PRINZIDE,ZESTORETIC) 20-12.5 MG tablet Take 1 tablet by mouth daily.    [provider]  Multiple Vitamin (MULTIVITAMIN WITH MINERALS) TABS tablet Take 1 tablet by mouth daily.    [provider]  potassium chloride SA (KLOR-CON) 20 MEQ tablet TAKE (1) TABLET BY MOUTH TWICE DAILY. 02/05/20   Brunetta Genera, MD  ranitidine (ZANTAC) 150 MG capsule Take 150 mg by mouth 2 (two) times daily. Patient not taking: Reported on 10/13/2019    [provider]  traMADol (ULTRAM) 50 MG tablet Take 1 tablet (50 mg total) by mouth every 6 (six) hours as needed for  moderate pain or severe pain. Patient not taking: Reported on 10/13/2019 08/10/19   Brunetta Genera, MD  Vitamins/Minerals TABS Take by mouth. Patient not taking: Reported on 10/13/2019    [provider]    Allergies    Penicillins, Penicillins cross reactors, and Tape  Review of Systems   Review of Systems  Constitutional: Negative for fever.  Respiratory: Negative for shortness of breath.   Cardiovascular: Negative for chest pain and leg swelling.  Gastrointestinal: Negative for abdominal distention, abdominal pain and constipation.  Genitourinary: Positive for frequency. Negative for difficulty urinating, dysuria, flank pain and urgency.  Musculoskeletal: Positive for back pain. Negative for gait problem and joint swelling.  Skin: Negative for rash.  Neurological: Negative for weakness and numbness.  All other systems reviewed and are negative.   Physical Exam Updated Vital Signs BP 131/81 (BP Location: Right Arm)   Pulse 69   Temp 98.3 F (36.8 C) (Oral)   Resp 16   LMP 08/12/2010   SpO2 100%   Physical Exam Vitals and nursing note reviewed.  Constitutional:      Appearance: She is well-developed and well-nourished.  HENT:     Head: Normocephalic.  Eyes:     Conjunctiva/sclera: Conjunctivae normal.  Cardiovascular:     Rate and Rhythm: Normal rate.     Pulses: Intact distal pulses.     Comments: Pedal pulses normal. Pulmonary:     Effort: Pulmonary effort is normal.  Abdominal:     General: Bowel sounds are normal. There is no distension.     Palpations: Abdomen is  soft. There is no mass.     Tenderness: There is no guarding.  Musculoskeletal:        General: No edema. Normal range of motion.     Cervical back: Normal range of motion and neck supple.     Lumbar back: Tenderness present. No swelling, edema or spasms.  Skin:    General: Skin is warm and dry.  Neurological:     Mental Status: She is alert.     Sensory: No sensory deficit.     Motor: No tremor or atrophy.     Gait: Gait normal.     Deep Tendon Reflexes: Strength normal.     Comments: No strength deficit noted in hip and knee flexor and extensor muscle groups.  Ankle flexion and extension intact. Gait normal.  Psychiatric:        Mood and Affect: Mood and affect normal.     ED Results / Procedures / Treatments   Labs (all labs ordered are listed, but only abnormal results are displayed) Labs Reviewed  URINALYSIS, ROUTINE W REFLEX MICROSCOPIC - Abnormal; Notable for the following components:      Result Value   Hgb urine dipstick SMALL (*)    Bacteria, UA RARE (*)    All other components within normal limits    EKG None  Radiology DG Lumbar Spine 2-3 Views  Result Date: 05/17/2020 CLINICAL DATA:  History of lymphoma, back pain EXAM: LUMBAR SPINE - 2-3 VIEW COMPARISON:  None. FINDINGS: Degenerative facet disease throughout the lumbar spine, most pronounced in the lower lumbar spine. Disc spaces are maintained. Early anterior degenerative spurring in the lower lumbar spine. Normal alignment. No fracture. SI joints symmetric and unremarkable. Aortic atherosclerosis. No visible aneurysm. IMPRESSION: Degenerative changes.  No acute bony abnormality. Electronically Signed   By: Rolm Baptise M.D.   On: 05/17/2020 14:36    Procedures Procedures   Medications  Ordered in ED Medications  predniSONE (DELTASONE) tablet 60 mg (60 mg Oral Given 05/17/20 1454)    ED Course  I have reviewed the triage vital signs and the nursing notes.  Pertinent labs & imaging results that were  available during my care of the patient were reviewed by me and considered in my medical decision making (see chart for details).    MDM Rules/Calculators/A&P                          No neuro deficit on exam or by history to suggest emergent or surgical presentation.  Also discussed worsened sx that should prompt immediate re-evaluation including distal weakness, bowel/bladder retention/incontinence. Imaging reassuring with no bone lesions.  Discussed home tx, heat, activities as tolerated, prednisone taper which pt concurs has always been helpful in the past with other sciatica flares.  Hydrocodone with caution advised.      Final Clinical Impression(s) / ED Diagnoses Final diagnoses:  Bilateral sciatica    Rx / DC Orders ED Discharge Orders         Ordered    predniSONE (DELTASONE) 10 MG tablet        05/17/20 1451    HYDROcodone-acetaminophen (NORCO/VICODIN) 5-325 MG tablet  Every 4 hours PRN        05/17/20 1451           Evalee Jefferson, Hershal Coria 05/18/20 1306    Truddie Hidden, MD 05/18/20 1539

## 2020-05-17 NOTE — ED Triage Notes (Signed)
Back pain radiating into legs for 2 days

## 2020-05-17 NOTE — ED Notes (Signed)
Patient transported to X-ray 

## 2020-05-17 NOTE — Discharge Instructions (Signed)
Your exam is reassuring, your x-ray is also negative for any acute injuries or metastatic changes, your urinalysis is clear without infection.  You do continue to have a trace amount of red blood cells in your urine however, this should be rechecked by your primary provider to ensure this clears.  Take the medications prescribed for your back pain.  I also recommend a heating pad 20 minutes several times daily which can also help relieve your symptoms.  Do not drive within 4 hours of taking the hydrocodone as this medication can make you drowsy as discussed.  Get rechecked immediately for any worsening pain, weakness in either leg or if you develop any urinary or fecal incontinence or retention.  These can be symptoms of worsening condition.

## 2020-05-22 NOTE — Progress Notes (Signed)
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 female who has been referred to [place] by my colleague Dr. Cecil Cobbs in Neuro-Oncology for evaluation and management of her Newly diagnosed Extranodal Marginal Zone Lymphoma. She is accompanied today by her sister [name] via [employer]. The pt reports that she is doing well overall.  Prior to today's visit, the pt presented to the ED on 07/21/18 with complaints of a headache for the past month which presented intermittently and was worsened by bending, coughing, or laughing. She had a CT Head and MRI Brain which revealed extensive dural thickening, as noted below. She then had a right posterior fossa craniectomy on 07/28/18 for open exicisional biopsies of the right cerebellar hemisphere and dura which revealed Extranodal marginal zone lymphoma. The pt then established care with my colleague Dr. Cecil Cobbs in Neuro-Onc on 08/11/18. She has been taking 4mg  Decadron daily.  The pt reports that developed worsened headaches about a month ago, and notes that when she laughed it felt like "electiricty" was running through the sides and back of her head. She notes that some mild headaches had begun a couple months ago. Denies double vision, changes in vision, loss of balance, or other senses being affected. The pt notes that in addition to this, she developed redness in the eyes in the past couple months, saw eye doctor and was prescribed eye drops. Denies other concerns in the last 6 months including bone pains, fatigue, fevers, chills, night sweats or unexpected weight loss. She notes that her headaches have resolved. She notes she began Decadron after her biopsy. She denies having "any symptoms  whatsoever," currently.   Of note prior to the patient's visit today, pt has had an MRI Brain completed on 07/21/18 with results revealing "Extensive dural thickening surrounding the right cerebellar hemisphere with mass-effect and edema in the right cerebellum. The process appears to extend into the upper cervical canal. Mild obstructive hydrocephalus. Differential includes tumor including metastatic disease and lymphoma. Chronic inflammatory process such as sarcoid is a consideration however chest x-ray negative. TB and atypical infection/fungus also possible. 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  On review of systems, pt reports good energy levels, headache resolution, and denies joint issues, skin rashes, dry mouth, dry eyes, fevers, chills, night sweats, bone pains, unexpected weight loss, fatigue, double vision, loss of vision, affected senses, loss of balance, and any other symptoms.   On PMHx the pt reports sickle cel trait, HTN, hyperlipidemia. On Social Hx the pt reports that she smoked 1 ppd for the last 42 years, and reports having quit two days ago. 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 On Family Hx the pt reports dad with lupus and sister with sarcoidosis. Daughter and son with sickle cell disease. Denies other blood disorders or cancers. Endorses Penicillin allergy  Interval History:   Amber Drake returns today for management and evaluation of her CNS Extranodal Marginal Zone Lymphoma.  The patient's last visit with [place] was on 03/23/2020. The pt reports that she is doing well overall.  The pt reports no acute new issues. No headache or other focal neurological symptoms.  Lab results today 05/24/2020 of CBC  w/diff and CMP stable.  On review of systems, pt reports no other symptoms. No fevers no chills no nightsweats.  MEDICAL HISTORY:  Past Medical History:  Diagnosis Date  . Anemia   . Arthritis   . Blood dyscrasia    sickle cell trait  . Cancer ([place])    lymphoma  . Carbuncle and furuncle   . Hypercholesterolemia   . Hypertension    does not take meds  . Sickle cell trait ([place])     SURGICAL HISTORY: Past Surgical History:  Procedure Laterality Date  . APPENDECTOMY    . APPLICATION OF CRANIAL NAVIGATION N/A 07/28/2018   Procedure: APPLICATION OF CRANIAL NAVIGATION;  Surgeon: Kary Kos, MD;  Location: [place];  Service: Neurosurgery;  Laterality: N/A;  . BRAIN SURGERY    . CARPAL TUNNEL RELEASE  03/23/2011   Procedure: CARPAL TUNNEL RELEASE;  Surgeon: Sanjuana Kava;  Location: [facility];  Service: Orthopedics;  Laterality: Left;  . CARPAL TUNNEL RELEASE  05/10/2011   Procedure: CARPAL TUNNEL RELEASE;  Surgeon: Sanjuana Kava, MD;  Location: [facility];  Service: Orthopedics;  Laterality: Right;  . CESAREAN SECTION     x 2  . INCISION AND DRAINAGE PERIRECTAL ABSCESS  12/21/09  . PR DURAL GRAFT REPAIR,SPINE DEFECT N/A 07/28/2018   Procedure: Stereotactic open biopsy of Right cerebellar hemisphere and dura with brainlab;  Surgeon: Kary Kos, MD;  Location: [place];  Service: Neurosurgery;  Laterality: N/A;  Stereotactic open biopsy of Right cerebellar hemisphere and dura with brainlab  . PROCTOSCOPY  10/17/2010   Procedure: PROCTOSCOPY;  Surgeon: Scherry Ran;  Location: [facility];  Service: General;  Laterality: N/A;  Rigid Proctoscopy/Possible Fistula in Ano  Procedure ended at 1003  . THERAPEUTIC ABORTION     x2    SOCIAL HISTORY: Social History   Socioeconomic History  . Marital status: Married    Spouse name: Not on file  . Number of children: Not on file  . Years of education: Not on file  . Highest education level: Not on file  Occupational History  . Not on file  Tobacco  Use  . Smoking status: Current Every Day Smoker    Packs/day: 0.50    Years: 30.00    Pack years: 15.00    Types: Cigarettes  . Smokeless tobacco: Never Used  Vaping Use  . Vaping Use: Never used  Substance and Sexual Activity  . Alcohol use: No  . 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 Sexual activity: Yes    Partners: Male    Birth control/protection: None  Other Topics Concern  . Not on file  Social History Narrative  . Not on file   Social Determinants of Health   Financial Resource Strain: Not on file  Food Insecurity: Not on file  Transportation Needs: Not on file  Physical Activity: Not on file  Stress: Not on file  Social Connections: Not on file  Intimate Partner Violence: Not on file    FAMILY HISTORY: Family History  Problem Relation Age of Onset  . Kidney failure Daughter   . Sickle cell anemia Daughter   . Sickle cell trait Daughter   . Sickle cell anemia Son   . Hypertension  Mother   . Hyperlipidemia Mother   . Hypertension Father   . Lupus Father        skin  . Hypertension Other   . Sarcoidosis Sister   . Anesthesia problems Neg Hx   . Hypotension Neg Hx   . Malignant hyperthermia Neg Hx   . Pseudochol deficiency Neg Hx     ALLERGIES:  is allergic to penicillins, penicillins cross reactors, and tape.  MEDICATIONS:  Current Outpatient Medications  Medication Sig Dispense Refill  . albuterol (PROAIR HFA) 108 (90 Base) MCG/ACT inhaler Inhale 2 puffs into the lungs 4 (four) times daily as needed. (Patient not taking: Reported on 10/13/2019)    . atorvastatin (LIPITOR) 20 MG tablet Take 1 tablet (20 mg total) by mouth daily. 90 tablet 2  . cephALEXin (KEFLEX) 500 MG capsule Take 1 capsule (500 mg total) by mouth 3 (three) times daily. 21 capsule 0  . clindamycin (CLINDAGEL) 1 % gel Apply topically 2 (two) times daily. To boils in the groin 30 g 1  . cyclobenzaprine (FLEXERIL) 10 MG tablet Take 1 tablet (10 mg total) by mouth 3 (three)  times daily as needed for muscle spasms. (Patient not taking: Reported on 05/20/2019) 40 tablet 0  . dexamethasone (DECADRON) 1 MG tablet 1mg  po daily with breakfast till 8/24 then 1mg  po every other day for 10 days then stop (Patient not taking: Reported on 10/13/2019) 30 tablet 0  . doxycycline (VIBRAMYCIN) 100 MG capsule Take 1 capsule (100 mg total) by mouth 2 (two) times daily. 20 capsule 0  . ferrous sulfate 325 (65 FE) MG tablet Take 325 mg by mouth daily with breakfast. (Patient not taking: Reported on 10/13/2019)    . HYDROcodone-acetaminophen (NORCO/VICODIN) 5-325 MG tablet Take 1 tablet by mouth every 4 (four) hours as needed for moderate pain. 12 tablet 0  . ibuprofen (ADVIL) 800 MG tablet Take 1 tablet (800 mg total) by mouth every 8 (eight) hours as needed. 20 tablet 1  . lisinopril-hydrochlorothiazide (PRINZIDE,ZESTORETIC) 20-12.5 MG tablet Take 1 tablet by mouth daily.    . Multiple Vitamin (MULTIVITAMIN WITH MINERALS) TABS tablet Take 1 tablet by mouth daily.    . potassium chloride SA (KLOR-CON) 20 MEQ tablet TAKE (1) TABLET BY MOUTH TWICE DAILY. 60 tablet 0  . predniSONE (DELTASONE) 10 MG tablet 6, 5, 4, 3, 2 then 1 tablet by mouth daily for 6 days total. 21 tablet 0  . ranitidine (ZANTAC) 150 MG capsule Take 150 mg by mouth 2 (two) times daily. (Patient not taking: Reported on 10/13/2019)    . traMADol (ULTRAM) 50 MG tablet Take 1 tablet (50 mg total) by mouth every 6 (six) hours as needed for moderate pain or severe pain. (Patient not taking: Reported on 10/13/2019) 30 tablet 0  . Vitamins/Minerals TABS Take by mouth. (Patient not taking: Reported on 10/13/2019)     No current facility-administered medications for this visit.   Facility-Administered Medications Ordered in Other Visits  Medication Dose Route Frequency Provider Last Rate Last Admin  . acetaminophen (TYLENOL) tablet 500 mg  500 mg Oral Once Brunetta Genera, MD       And  . oxyCODONE (Oxy IR/ROXICODONE) immediate  release tablet 5 mg  5 mg Oral Once Brunetta Genera, MD      . dexamethasone (DECADRON) 10 mg in sodium chloride 0.9 % 50 mL IVPB  10 mg Intravenous Once Brunetta Genera, MD        REVIEW OF SYSTEMS:  10 Point review of Systems was done is negative except as noted above.  PHYSICAL EXAMINATION: ECOG PERFORMANCE STATUS: 1 - Symptomatic but completely ambulatory  . Vitals:   05/24/20 1132  BP: 127/60  Pulse: 75  Resp: 18  Temp: 97.8 F (36.6 C)  SpO2: 100%   Filed Weights   05/24/20 1132  Weight: 161 lb 9.6 oz (73.3 kg)   .Body mass index is 27.74 kg/m.   NAD GENERAL:alert, in no acute distress and comfortable SKIN: no acute rashes, no significant lesions EYES: conjunctiva are pink and non-injected, sclera anicteric OROPHARYNX: MMM, no exudates, no oropharyngeal erythema or ulceration NECK: supple, no JVD LYMPH:  no palpable lymphadenopathy in the cervical, axillary or inguinal regions LUNGS: clear to auscultation b/l with normal respiratory effort HEART: regular rate & rhythm ABDOMEN:  normoactive bowel sounds , non tender, not distended. Extremity: no pedal edema PSYCH: alert & oriented x 3 with fluent speech NEURO: no focal motor/sensory deficits  LABORATORY DATA:  I have reviewed the data as listed  . CBC Latest Ref Rng & Units 05/24/2020 03/23/2020 01/26/2020  WBC 4.0 - 10.5 K/uL 5.0 6.5 6.4  Hemoglobin 12.0 - 15.0 g/dL 12.1 11.5(L) 12.0  Hematocrit 36.0 - 46.0 % 35.9(L) 34.5(L) 36.0  Platelets 150 - 400 K/uL 386 395 411(H)    . CMP Latest Ref Rng & Units 05/24/2020 03/23/2020 01/26/2020  Glucose 70 - 99 mg/dL 92 98 82  BUN 6 - 20 mg/dL 12 10 10   Creatinine 0.44 - 1.00 mg/dL 0.76 0.75 0.73  Sodium 135 - 145 mmol/L 142 142 140  Potassium 3.5 - 5.1 mmol/L 3.4(L) 3.2(L) 3.4(L)  Chloride 98 - 111 mmol/L 106 106 106  CO2 22 - 32 mmol/L 26 27 26   Calcium 8.9 - 10.3 mg/dL 9.9 10.0 10.0  Total Protein 6.5 - 8.1 g/dL 7.0 7.1 6.8  Total Bilirubin 0.3 -  1.2 mg/dL 0.3 0.2(L) <0.2(L)  Alkaline Phos 38 - 126 U/L 73 77 72  AST 15 - 41 U/L 13(L) 19 16  ALT 0 - 44 U/L 12 15 13    . Lab Results  Component Value Date   LDH 142 05/24/2020     09/12/18 BM Biopsy:    07/28/18 Right cerebellar hemisphere and dura biopsy:     RADIOGRAPHIC STUDIES: I have personally reviewed the radiological images as listed and agreed with the findings in the report. DG Lumbar Spine 2-3 Views  Result Date: 05/17/2020 CLINICAL DATA:  History of lymphoma, back pain EXAM: LUMBAR SPINE - 2-3 VIEW COMPARISON:  None. FINDINGS: Degenerative facet disease throughout the lumbar spine, most pronounced in the lower lumbar spine. Disc spaces are maintained. Early anterior degenerative spurring in the lower lumbar spine. Normal alignment. No fracture. SI joints symmetric and unremarkable. Aortic atherosclerosis. No visible aneurysm. IMPRESSION: Degenerative changes.  No acute bony abnormality. Electronically Signed   By: Rolm Baptise M.D.   On: 05/17/2020 14:36    ASSESSMENT & PLAN:  60 y.o. female with  1. 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 The process appears to extend into the upper cervical canal. Mild obstructive hydrocephalus. Differential includes tumor  including metastatic disease and lymphoma. Chronic inflammatory process such as sarcoid is a consideration however chest x-ray negative. TB and atypical infection/fungus also possible. 6 mm colloid cyst felt to be a separate problem."  07/25/18 CT C/A/P which did not reveal  significant abnormality  09/12/18 BM Bx revealed no evidence of lymphoma  09/08/18 PET/CT revealed "No hypermetabolic mass or adenopathy identified within the chest abdomen or pelvis to suggest metabolically active tumor. 2. Nonspecific focus of increased uptake within the cord extending from T12 to L1. Cannot rule out additional site of CNS lymphoma. Consider further evaluation with contrast enhanced MRI through this area. 3. Aortic Atherosclerosis and Emphysema."  10/16/2018 MRI brain w and w/o contrast revealed "1. Resolved dural mass in the right posterior fossa with minimal smooth dural thickening that may be treatment related. Cerebellar edema and mass effect is also resolved. No new site of disease. 2. 6 mm colloid cyst."  10/20/2018 MRI cervical spine w and w/o contrast revealed "Negative for lymphoma. No acute abnormality. Central disc protrusion at C5-6 effaces the ventral thecal sac causing mild central canal narrowing. There is also a shallow disc bulge at C6-7 which narrows but does not efface the ventral thecal Sac."  01/13/2019 MRI Brain ([id number]) revealed "1. Stable and satisfactory post treatment appearance of the posterior fossa. 2. Stable small 6 mm colloid cyst. 3. No new intracranial abnormality."  05/06/2019 MRI Cervical Spine ([id number]) revealed "No evidence of lymphoma in the cervical spine. Previously noted dural enhancement in the posterior fossa and cervical canal has resolved. No cord compression or cord lesion. Chronic cervical spine degenerative changes are stable from the prior study."  12/15/2019 MRI Brain ([id number]) revealed "No acute intracranial abnormality. No enhancing mass lesion 5 mm colloid cyst in the third ventricle is chronic and unchanged from prior studies."  PLAN: -Discussed pt labwork today, 05/24/2020; labs stable. LDH wnl --Advised pt that there is no lab or clinical evidence of CNS Extranodal Marginal Zone Lymphoma recurrence at this  time. -Will continue maintenance Rituxan q8weeks for up to three years. The pt has no prohibitive toxicities. -Continue to avoid Ibuprofen, use OTC Tylenol for pain management.  -Recommend pt increase dietary Potassium  -Will see back in 2 months -MRI brain in 6 weeks  FOLLOW UP: RTC for next maintenance Rituxan as scheduled on 4/25 with labs and MD visit MRI brain in 6 weeks    The total time spent in the appointment was 30 minutes and more than 50% was on counseling and direct patient cares, ordering and mx of immunotherapy  All of the patient's questions were answered with apparent satisfaction. The patient knows to call the clinic with any problems, questions or concerns.    Sullivan Lone MD [place] AAHIVMS [facility] Hematology/Oncology Physician [facility]  (Office):       [phone] (Work cell):  [phone] (Fax):           [phone]  05/22/2020 10:09 AM  I, Reinaldo Raddle, am acting as scribe for Dr. Sullivan Lone, MD.    .I have reviewed the above documentation for accuracy and completeness, and I agree with the above. Brunetta Genera MD

## 2020-05-24 ENCOUNTER — Inpatient Hospital Stay (HOSPITAL_BASED_OUTPATIENT_CLINIC_OR_DEPARTMENT_OTHER): Payer: Commercial Managed Care - PPO | Admitting: Hematology

## 2020-05-24 ENCOUNTER — Inpatient Hospital Stay: Payer: Commercial Managed Care - PPO

## 2020-05-24 ENCOUNTER — Inpatient Hospital Stay: Payer: Commercial Managed Care - PPO | Attending: Hematology

## 2020-05-24 ENCOUNTER — Other Ambulatory Visit: Payer: Self-pay

## 2020-05-24 ENCOUNTER — Encounter: Payer: Self-pay | Admitting: *Deleted

## 2020-05-24 VITALS — BP 127/60 | HR 75 | Temp 97.8°F | Resp 18 | Ht 64.0 in | Wt 161.6 lb

## 2020-05-24 DIAGNOSIS — R519 Headache, unspecified: Secondary | ICD-10-CM | POA: Insufficient documentation

## 2020-05-24 DIAGNOSIS — E785 Hyperlipidemia, unspecified: Secondary | ICD-10-CM | POA: Insufficient documentation

## 2020-05-24 DIAGNOSIS — Z79899 Other long term (current) drug therapy: Secondary | ICD-10-CM | POA: Diagnosis not present

## 2020-05-24 DIAGNOSIS — F17210 Nicotine dependence, cigarettes, uncomplicated: Secondary | ICD-10-CM | POA: Diagnosis not present

## 2020-05-24 DIAGNOSIS — I10 Essential (primary) hypertension: Secondary | ICD-10-CM | POA: Insufficient documentation

## 2020-05-24 DIAGNOSIS — C884 Extranodal marginal zone B-cell lymphoma of mucosa-associated lymphoid tissue [MALT-lymphoma]: Secondary | ICD-10-CM | POA: Diagnosis not present

## 2020-05-24 DIAGNOSIS — Z5112 Encounter for antineoplastic immunotherapy: Secondary | ICD-10-CM | POA: Diagnosis not present

## 2020-05-24 DIAGNOSIS — M1990 Unspecified osteoarthritis, unspecified site: Secondary | ICD-10-CM | POA: Diagnosis not present

## 2020-05-24 DIAGNOSIS — D573 Sickle-cell trait: Secondary | ICD-10-CM | POA: Insufficient documentation

## 2020-05-24 DIAGNOSIS — Z7189 Other specified counseling: Secondary | ICD-10-CM

## 2020-05-24 LAB — CBC WITH DIFFERENTIAL/PLATELET
Abs Immature Granulocytes: 0.01 10*3/uL (ref 0.00–0.07)
Basophils Absolute: 0 10*3/uL (ref 0.0–0.1)
Basophils Relative: 0 %
Eosinophils Absolute: 0.1 10*3/uL (ref 0.0–0.5)
Eosinophils Relative: 1 %
HCT: 35.9 % — ABNORMAL LOW (ref 36.0–46.0)
Hemoglobin: 12.1 g/dL (ref 12.0–15.0)
Immature Granulocytes: 0 %
Lymphocytes Relative: 33 %
Lymphs Abs: 1.7 10*3/uL (ref 0.7–4.0)
MCH: 28.1 pg (ref 26.0–34.0)
MCHC: 33.7 g/dL (ref 30.0–36.0)
MCV: 83.5 fL (ref 80.0–100.0)
Monocytes Absolute: 0.5 10*3/uL (ref 0.1–1.0)
Monocytes Relative: 11 %
Neutro Abs: 2.7 10*3/uL (ref 1.7–7.7)
Neutrophils Relative %: 55 %
Platelets: 386 10*3/uL (ref 150–400)
RBC: 4.3 MIL/uL (ref 3.87–5.11)
RDW: 13.1 % (ref 11.5–15.5)
WBC: 5 10*3/uL (ref 4.0–10.5)
nRBC: 0 % (ref 0.0–0.2)

## 2020-05-24 LAB — CMP (CANCER CENTER ONLY)
ALT: 12 U/L (ref 0–44)
AST: 13 U/L — ABNORMAL LOW (ref 15–41)
Albumin: 3.8 g/dL (ref 3.5–5.0)
Alkaline Phosphatase: 73 U/L (ref 38–126)
Anion gap: 10 (ref 5–15)
BUN: 12 mg/dL (ref 6–20)
CO2: 26 mmol/L (ref 22–32)
Calcium: 9.9 mg/dL (ref 8.9–10.3)
Chloride: 106 mmol/L (ref 98–111)
Creatinine: 0.76 mg/dL (ref 0.44–1.00)
GFR, Estimated: >60 mL/min (ref >60)
Glucose, Bld: 92 mg/dL (ref 70–99)
Potassium: 3.4 mmol/L — ABNORMAL LOW (ref 3.5–5.1)
Sodium: 142 mmol/L (ref 135–145)
Total Bilirubin: 0.3 mg/dL (ref 0.3–1.2)
Total Protein: 7 g/dL (ref 6.5–8.1)

## 2020-05-24 LAB — LACTATE DEHYDROGENASE: LDH: 142 U/L (ref 98–192)

## 2020-05-24 MED ORDER — ACETAMINOPHEN 325 MG PO TABS
ORAL_TABLET | ORAL | Status: AC
  Filled 2020-05-24: qty 2

## 2020-05-24 MED ORDER — DIPHENHYDRAMINE HCL 25 MG PO CAPS
ORAL_CAPSULE | ORAL | Status: AC
  Filled 2020-05-24: qty 2

## 2020-05-24 MED ORDER — DIPHENHYDRAMINE HCL 25 MG PO CAPS
50.0000 mg | ORAL_CAPSULE | Freq: Once | ORAL | Status: AC
  Administered 2020-05-24: 50 mg via ORAL

## 2020-05-24 MED ORDER — SODIUM CHLORIDE 0.9 % IV SOLN
10.0000 mg | Freq: Once | INTRAVENOUS | Status: AC
  Administered 2020-05-24: 10 mg via INTRAVENOUS
  Filled 2020-05-24: qty 10

## 2020-05-24 MED ORDER — FAMOTIDINE IN NACL 20-0.9 MG/50ML-% IV SOLN
20.0000 mg | Freq: Once | INTRAVENOUS | Status: AC
  Administered 2020-05-24: 20 mg via INTRAVENOUS

## 2020-05-24 MED ORDER — POTASSIUM CHLORIDE CRYS ER 20 MEQ PO TBCR: EXTENDED_RELEASE_TABLET | ORAL | 0 refills | Status: DC

## 2020-05-24 MED ORDER — SODIUM CHLORIDE 0.9 % IV SOLN
Freq: Once | INTRAVENOUS | Status: AC
  Filled 2020-05-24: qty 250

## 2020-05-24 MED ORDER — RITUXIMAB-PVVR CHEMO 500 MG/50ML IV SOLN
375.0000 mg/m2 | Freq: Once | INTRAVENOUS | Status: AC
  Administered 2020-05-24: 700 mg via INTRAVENOUS
  Filled 2020-05-24: qty 50

## 2020-05-24 MED ORDER — FAMOTIDINE IN NACL 20-0.9 MG/50ML-% IV SOLN
INTRAVENOUS | Status: AC
  Filled 2020-05-24: qty 50

## 2020-05-24 MED ORDER — ACETAMINOPHEN 325 MG PO TABS
650.0000 mg | ORAL_TABLET | Freq: Once | ORAL | Status: AC
  Administered 2020-05-24: 650 mg via ORAL

## 2020-05-31 ENCOUNTER — Encounter: Payer: Self-pay | Admitting: Orthopaedic Surgery

## 2020-05-31 ENCOUNTER — Other Ambulatory Visit: Payer: Self-pay

## 2020-05-31 ENCOUNTER — Ambulatory Visit (INDEPENDENT_AMBULATORY_CARE_PROVIDER_SITE_OTHER): Payer: Commercial Managed Care - PPO | Admitting: Orthopaedic Surgery

## 2020-05-31 ENCOUNTER — Ambulatory Visit: Payer: Medicaid Other

## 2020-05-31 VITALS — BP 135/80 | HR 82 | Ht 64.0 in | Wt 163.0 lb

## 2020-05-31 DIAGNOSIS — G8929 Other chronic pain: Secondary | ICD-10-CM | POA: Diagnosis not present

## 2020-05-31 DIAGNOSIS — M545 Low back pain, unspecified: Secondary | ICD-10-CM

## 2020-05-31 MED ORDER — CYCLOBENZAPRINE HCL 10 MG PO TABS: 10.0000 mg | ORAL_TABLET | Freq: Every day | ORAL | 0 refills | Status: DC

## 2020-05-31 MED ORDER — HYDROCODONE-ACETAMINOPHEN 5-325 MG PO TABS: ORAL_TABLET | ORAL | 0 refills | Status: DC

## 2020-05-31 MED ORDER — NAPROXEN 500 MG PO TABS: 500.0000 mg | ORAL_TABLET | Freq: Two times a day (BID) | ORAL | 5 refills | Status: DC

## 2020-05-31 NOTE — Progress Notes (Signed)
Patient Amber Drake, female DOB:Feb 19, 1961, 60 y.o. JJO:[id number]  Chief Complaint  Patient presents with   Back Pain    Lower back pain and neck pain for 2 weeks.   Neck Pain    HPI  Amber Drake is a 60 y.o. female who has been having pain of the lower back for several months.  She has no trauma.  She has localized pain with no paresthesias, no weakness.  She has tried Advil, heat and ice with no help.  She has history of lymphoma and positive ANA. She has sickle cell trait and had a daughter die of sickle cell two years ago.  She was seen by me in 2019.  She has no redness, no other joint pains.   Body mass index is 27.98 kg/m.  ROS  Review of Systems  Constitutional: Positive for activity change.  Musculoskeletal: Positive for arthralgias and back pain.  All other systems reviewed and are negative.   All other systems reviewed and are negative.  The following is a summary of the past history medically, past history surgically, known current medicines, social history and family history.  This information is gathered electronically by the computer from prior information and documentation.  I review this each visit and have found including this information at this point in the chart is beneficial and informative.    Past Medical History:  Diagnosis Date   Anemia    Arthritis    Blood dyscrasia    sickle cell trait   Cancer ([place])    lymphoma   Carbuncle and furuncle    Hypercholesterolemia    Hypertension    does not take meds   Sickle cell trait [facility])     Past Surgical History:  Procedure Laterality Date   APPENDECTOMY     APPLICATION OF CRANIAL NAVIGATION N/A 07/28/2018   Procedure: APPLICATION OF CRANIAL NAVIGATION;  Surgeon: Kary Kos, MD;  Location: [place];  Service: Neurosurgery;  Laterality: N/A;   BRAIN SURGERY     CARPAL TUNNEL RELEASE  03/23/2011   Procedure: CARPAL TUNNEL RELEASE;  Surgeon: Sanjuana Kava;  [facility];   Service: Orthopedics;  Laterality: Left;   CARPAL TUNNEL RELEASE  05/10/2011   Procedure: CARPAL TUNNEL RELEASE;  Surgeon: Sanjuana Kava, MD;  [facility];  Service: Orthopedics;  Laterality: Right;   CESAREAN SECTION     x 2   INCISION AND DRAINAGE PERIRECTAL ABSCESS  12/21/09   PR DURAL GRAFT REPAIR,SPINE DEFECT N/A 07/28/2018   Procedure: Stereotactic open biopsy of Right cerebellar hemisphere and dura with brainlab;  Surgeon: Kary Kos, MD;  Location: [place];  Service: Neurosurgery;  Laterality: N/A;  Stereotactic open biopsy of Right cerebellar hemisphere and dura with brainlab   PROCTOSCOPY  10/17/2010   Procedure: PROCTOSCOPY;  Surgeon: Scherry Ran;  [facility];  Service: General;  Laterality: N/A;  Rigid Proctoscopy/Possible Fistula in Ano  Procedure ended at [place]     x2    Family History  Problem Relation Age of Onset   Kidney failure Daughter    Sickle cell anemia Daughter    Sickle cell trait Daughter    Sickle cell anemia Son    Hypertension Mother    Hyperlipidemia Mother    Hypertension Father    Lupus Father        skin   Hypertension Other    Sarcoidosis Sister    Anesthesia problems Neg Hx    Hypotension Neg Hx  Malignant hyperthermia Neg Hx    Pseudochol deficiency Neg Hx     Social History Social History   Tobacco Use   Smoking status: Current Every Day Smoker    Packs/day: 0.50    Years: 30.00    Pack years: 15.00    Types: Cigarettes   Smokeless tobacco: Never Used  Scientific laboratory technician Use: Never used  Substance Use Topics   Alcohol use: No   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    Penicillins Cross Reactors Itching and Swelling    PENICILLIN > ANAPHYLAXIS   Tape Itching    Current Outpatient Medications  Medication Sig Dispense Refill   atorvastatin (LIPITOR) 20 MG tablet Take 1 tablet (20 mg total) by mouth daily. 90 tablet 2   cyclobenzaprine (FLEXERIL) 10 MG tablet Take 1 tablet (10 mg total) by mouth at bedtime. One tablet every night at bedtime as needed for spasm. 30 tablet 0   ferrous sulfate 325 (65 FE) MG tablet Take 325 mg by mouth daily with breakfast.     HYDROcodone-acetaminophen (NORCO/VICODIN) 5-325 MG tablet One tablet every four hours as needed for acute pain.  Limit of five days per [place]. 30 tablet 0   ibuprofen (ADVIL) 800 MG tablet Take 1 tablet (800 mg total) by mouth every 8 (eight) hours as needed. 20 tablet 1   lisinopril-hydrochlorothiazide (PRINZIDE,ZESTORETIC) 20-12.5 MG tablet Take 1 tablet by mouth daily.     Multiple Vitamin (MULTIVITAMIN WITH MINERALS) TABS tablet Take 1 tablet by mouth daily.     naproxen (NAPROSYN) 500 MG tablet Take 1 tablet (500 mg total) by mouth 2 (two) times daily with a meal. 60 tablet 5   potassium chloride SA (KLOR-CON) 20 MEQ tablet TAKE (1) TABLET BY MOUTH TWICE DAILY. 60 tablet 0   ranitidine (ZANTAC) 150 MG capsule Take 150 mg by mouth 2 (two) times daily.     predniSONE (DELTASONE) 10 MG tablet 6, 5, 4, 3, 2 then 1 tablet by mouth daily for 6 days total. (Patient not taking: Reported on 05/31/2020) 21 tablet 0   No current facility-administered medications for this visit.   Facility-Administered Medications Ordered in Other Visits  Medication Dose Route Frequency Provider Last Rate Last Admin   acetaminophen (TYLENOL) tablet 500 mg  500 mg Oral Once  Brunetta Genera, MD       And   oxyCODONE (Oxy IR/ROXICODONE) immediate release tablet 5 mg  5 mg Oral Once Brunetta Genera, MD       dexamethasone (DECADRON) 10 mg in sodium chloride 0.9 % 50 mL IVPB  10 mg Intravenous Once Brunetta Genera, MD         Physical Exam  Blood pressure 135/80, pulse 82, height 5\' 4"  (1.626 m), weight 163 lb (73.9 kg), last menstrual period 08/12/2010.  Constitutional: overall normal hygiene, normal nutrition, well developed, normal grooming, normal body habitus. Assistive device:none  Musculoskeletal: gait  and station Limp none, muscle tone and strength are normal, no tremors or atrophy is present.  .  Neurological: coordination overall normal.  Deep tendon reflex/nerve stretch intact.  Sensation normal.  Cranial nerves II-XII intact.   Skin:   Normal overall no scars, lesions, ulcers or rashes. No psoriasis.  Psychiatric: Alert and oriented x 3.  Recent memory intact, remote memory unclear.  Normal mood and affect. Well groomed.  Good eye contact.  Cardiovascular: overall no swelling, no varicosities, no edema bilaterally, normal temperatures of the legs and arms, no clubbing, cyanosis and good capillary refill.  Lymphatic: palpation is normal.  Spine/Pelvis examination:  Inspection:  Overall, sacoiliac joint benign and hips nontender; without crepitus or defects.   Thoracic spine inspection: Alignment normal without kyphosis present   Lumbar spine inspection:  Alignment  with normal lumbar lordosis, without scoliosis apparent.   Thoracic spine palpation:  without tenderness of spinal processes   Lumbar spine palpation: without tenderness of lumbar area; without tightness of lumbar muscles    Range of Motion:   Lumbar flexion, forward flexion is normal without pain or tenderness    Lumbar extension is full without pain or tenderness   Left lateral bend is normal without pain or tenderness   Right lateral bend is normal without  pain or tenderness   Straight leg raising is normal  Strength & tone: normal   Stability overall normal stability  All other systems reviewed and are negative   The patient has been educated about the nature of the problem(s) and counseled on treatment options.  The patient appeared to understand what I have discussed and is in agreement with it.  Encounter Diagnosis  Name Primary?   Chronic bilateral low back pain without sciatica Yes   X-rays of lumbar spine done, reported separately.  PLAN Call if any problems.  Precautions discussed.  I will begin Norco, Flexeril and Naprosyn.   Return to clinic 2 weeks   I have reviewed the [place] web site prior to prescribing narcotic medicine for this patient.   Electronically Signed Sanjuana Kava, MD 3/1/20229:33 AM

## 2020-06-14 ENCOUNTER — Other Ambulatory Visit: Payer: Self-pay

## 2020-06-14 ENCOUNTER — Encounter: Payer: Self-pay | Admitting: Orthopaedic Surgery

## 2020-06-14 ENCOUNTER — Ambulatory Visit (INDEPENDENT_AMBULATORY_CARE_PROVIDER_SITE_OTHER): Payer: Commercial Managed Care - PPO | Admitting: Orthopaedic Surgery

## 2020-06-14 VITALS — BP 113/73 | HR 82 | Ht 64.0 in | Wt 163.0 lb

## 2020-06-14 DIAGNOSIS — F17210 Nicotine dependence, cigarettes, uncomplicated: Secondary | ICD-10-CM | POA: Diagnosis not present

## 2020-06-14 DIAGNOSIS — M545 Low back pain, unspecified: Secondary | ICD-10-CM

## 2020-06-14 DIAGNOSIS — G8929 Other chronic pain: Secondary | ICD-10-CM | POA: Diagnosis not present

## 2020-06-14 MED ORDER — HYDROCODONE-ACETAMINOPHEN 7.5-325 MG PO TABS: ORAL_TABLET | ORAL | 0 refills | Status: DC

## 2020-06-14 NOTE — Patient Instructions (Signed)
Steps to Quit Smoking Smoking tobacco is the leading cause of preventable death. It can affect almost every organ in the body. Smoking puts you and people around you at risk for many serious, long-lasting (chronic) diseases. Quitting smoking can be hard, but it is one of the best things that you can do for your health. It is never too late to quit. How do I get ready to quit? When you decide to quit smoking, make a plan to help you succeed. Before you quit:  Pick a date to quit. Set a date within the next 2 weeks to give you time to prepare.  Write down the reasons why you are quitting. Keep this list in places where you will see it often.  Tell your family, friends, and co-workers that you are quitting. Their support is important.  Talk with your doctor about the choices that may help you quit.  Find out if your health insurance will pay for these treatments.  Know the people, places, things, and activities that make you want to smoke (triggers). Avoid them. What first steps can I take to quit smoking?  Throw away all cigarettes at home, at work, and in your car.  Throw away the things that you use when you smoke, such as ashtrays and lighters.  Clean your car. Make sure to empty the ashtray.  Clean your home, including curtains and carpets. What can I do to help me quit smoking? Talk with your doctor about taking medicines and seeing a counselor at the same time. You are more likely to succeed when you do both.  If you are pregnant or breastfeeding, talk with your doctor about counseling or other ways to quit smoking. Do not take medicine to help you quit smoking unless your doctor tells you to do so. To quit smoking: Quit right away  Quit smoking totally, instead of slowly cutting back on how much you smoke over a period of time.  Go to counseling. You are more likely to quit if you go to counseling sessions regularly. Take medicine You may take medicines to help you quit. Some  medicines need a prescription, and some you can buy over-the-counter. Some medicines may contain a drug called nicotine to replace the nicotine in cigarettes. Medicines may:  Help you to stop having the desire to smoke (cravings).  Help to stop the problems that come when you stop smoking (withdrawal symptoms). Your doctor may ask you to use:  Nicotine patches, gum, or lozenges.  Nicotine inhalers or sprays.  Non-nicotine medicine that is taken by mouth. Find resources Find resources and other ways to help you quit smoking and remain smoke-free after you quit. These resources are most helpful when you use them often. They include:  Online chats with a counselor.  Phone quitlines.  Printed self-help materials.  Support groups or group counseling.  Text messaging programs.  Mobile phone apps. Use apps on your mobile phone or tablet that can help you stick to your quit plan. There are many free apps for mobile phones and tablets as well as websites. Examples include Quit Guide from the CDC and smokefree.gov   What things can I do to make it easier to quit?  Talk to your family and friends. Ask them to support and encourage you.  Call a phone quitline (1-800-QUIT-NOW), reach out to support groups, or work with a counselor.  Ask people who smoke to not smoke around you.  Avoid places that make you want to smoke,   such as: ? Bars. ? Parties. ? Smoke-break areas at work.  Spend time with people who do not smoke.  Lower the stress in your life. Stress can make you want to smoke. Try these things to help your stress: ? Getting regular exercise. ? Doing deep-breathing exercises. ? Doing yoga. ? Meditating. ? Doing a body scan. To do this, close your eyes, focus on one area of your body at a time from head to toe. Notice which parts of your body are tense. Try to relax the muscles in those areas.   How will I feel when I quit smoking? Day 1 to 3 weeks Within the first 24 hours,  you may start to have some problems that come from quitting tobacco. These problems are very bad 2-3 days after you quit, but they do not often last for more than 2-3 weeks. You may get these symptoms:  Mood swings.  Feeling restless, nervous, angry, or annoyed.  Trouble concentrating.  Dizziness.  Strong desire for high-sugar foods and nicotine.  Weight gain.  Trouble pooping (constipation).  Feeling like you may vomit (nausea).  Coughing or a sore throat.  Changes in how the medicines that you take for other issues work in your body.  Depression.  Trouble sleeping (insomnia). Week 3 and afterward After the first 2-3 weeks of quitting, you may start to notice more positive results, such as:  Better sense of smell and taste.  Less coughing and sore throat.  Slower heart rate.  Lower blood pressure.  Clearer skin.  Better breathing.  Fewer sick days. Quitting smoking can be hard. Do not give up if you fail the first time. Some people need to try a few times before they succeed. Do your best to stick to your quit plan, and talk with your doctor if you have any questions or concerns. Summary  Smoking tobacco is the leading cause of preventable death. Quitting smoking can be hard, but it is one of the best things that you can do for your health.  When you decide to quit smoking, make a plan to help you succeed.  Quit smoking right away, not slowly over a period of time.  When you start quitting, seek help from your doctor, family, or friends. This information is not intended to replace advice given to you by your health care provider. Make sure you discuss any questions you have with your health care provider. Document Revised: 12/12/2018 Document Reviewed: 06/07/2018 Elsevier Patient Education  2021 Elsevier Inc.  

## 2020-06-14 NOTE — Progress Notes (Signed)
Patient PI:Amber Drake, female DOB:1960/07/15, 60 y.o. ZYS:[id number]  Chief Complaint  Patient presents with  . Back Pain    HPI  Amber Drake is a 60 y.o. female who has chronic lower back pain that is not improving.  She has midline pain with no sciatica.  She is taking her medicine and doing her back exercises.  She has no new trauma, no weakness.   Body mass index is 27.98 kg/m.  ROS  Review of Systems  Constitutional: Positive for activity change.  Musculoskeletal: Positive for arthralgias and back pain.  All other systems reviewed and are negative.   All other systems reviewed and are negative.  The following is a summary of the past history medically, past history surgically, known current medicines, social history and family history.  This information is gathered electronically by the computer from prior information and documentation.  I review this each visit and have found including this information at this point in the chart is beneficial and informative.    Past Medical History:  Diagnosis Date  . Anemia   . Arthritis   . Blood dyscrasia    sickle cell trait  . Cancer ([place])    lymphoma  . Carbuncle and furuncle   . Hypercholesterolemia   . Hypertension    does not take meds  . Sickle cell trait [facility])     Past Surgical History:  Procedure Laterality Date  . APPENDECTOMY    . APPLICATION OF CRANIAL NAVIGATION N/A 07/28/2018   Procedure: APPLICATION OF CRANIAL NAVIGATION;  Surgeon: Kary Kos, MD;  Location: [place];  Service: Neurosurgery;  Laterality: N/A;  . BRAIN SURGERY    . CARPAL TUNNEL RELEASE  03/23/2011   Procedure: CARPAL TUNNEL RELEASE;  Surgeon: Sanjuana Kava;  [facility];  Service: Orthopedics;  Laterality: Left;  . CARPAL TUNNEL RELEASE  05/10/2011   Procedure: CARPAL TUNNEL RELEASE;  Surgeon: Sanjuana Kava, MD;  [facility];  Service: Orthopedics;  Laterality: Right;  . CESAREAN SECTION     x 2  . INCISION AND DRAINAGE  PERIRECTAL ABSCESS  12/21/09  . PR DURAL GRAFT REPAIR,SPINE DEFECT N/A 07/28/2018   Procedure: Stereotactic open biopsy of Right cerebellar hemisphere and dura with brainlab;  Surgeon: Kary Kos, MD;  Location: [place];  Service: Neurosurgery;  Laterality: N/A;  Stereotactic open biopsy of Right cerebellar hemisphere and dura with brainlab  . PROCTOSCOPY  10/17/2010   Procedure: PROCTOSCOPY;  Surgeon: Scherry Ran;  [facility];  Service: General;  Laterality: N/A;  Rigid Proctoscopy/Possible Fistula in Ano  Procedure ended at 1003  . THERAPEUTIC ABORTION     x2    Family History  Problem Relation Age of Onset  . Kidney failure Daughter   . Sickle cell anemia Daughter   . Sickle cell trait Daughter   . Sickle cell anemia Son   . Hypertension Mother   . Hyperlipidemia Mother   . Hypertension Father   . Lupus Father        skin  . Hypertension Other   . Sarcoidosis Sister   . Anesthesia problems Neg Hx   . Hypotension Neg Hx   . Malignant hyperthermia Neg Hx   . Pseudochol deficiency Neg Hx     Social History Social History   Tobacco Use  . Smoking status: Current Every Day Smoker    Packs/day: 0.50    Years: 30.00    Pack years: 15.00    Types: Cigarettes  . Smokeless tobacco: Never  Used  Vaping Use  . Vaping Use: Never used  Substance Use Topics  . Alcohol use: No  . 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 Penicillins Anaphylaxis and Hives    Has patient had a PCN reaction causing immediate rash, facial/tongue/throat swelling, SOB or lightheadedness with hypotension: YES Has patient had a PCN reaction causing severe rash involving mucus membranes or skin necrosis: UNKNOWN  Has patient had a PCN reaction that required hospitalization: NO Has patient had a PCN reaction occurring within the last 10 years: NO If all of the above answers are "NO", then may proceed with Cephalosporin use.   Marland Kitchen Penicillins Cross  Reactors Itching and Swelling    PENICILLIN > ANAPHYLAXIS  . Tape Itching    Current Outpatient Medications  Medication Sig Dispense Refill  . atorvastatin (LIPITOR) 20 MG tablet Take 1 tablet (20 mg total) by mouth daily. 90 tablet 2  . cyclobenzaprine (FLEXERIL) 10 MG tablet Take 1 tablet (10 mg total) by mouth at bedtime. One tablet every night at bedtime as needed for spasm. 30 tablet 0  . ferrous sulfate 325 (65 FE) MG tablet Take 325 mg by mouth daily with breakfast.    . HYDROcodone-acetaminophen (NORCO) 7.5-325 MG tablet One tablet every four hours as needed for pain.  Five day supply for acute pain per [facility]. 30 tablet 0  . ibuprofen (ADVIL) 800 MG tablet Take 1 tablet (800 mg total) by mouth every 8 (eight) hours as needed. 20 tablet 1  . lisinopril-hydrochlorothiazide (PRINZIDE,ZESTORETIC) 20-12.5 MG tablet Take 1 tablet by mouth daily.    . Multiple Vitamin (MULTIVITAMIN WITH MINERALS) TABS tablet Take 1 tablet by mouth daily.    . naproxen (NAPROSYN) 500 MG tablet Take 1 tablet (500 mg total) by mouth 2 (two) times daily with a meal. 60 tablet 5  . potassium chloride SA (KLOR-CON) 20 MEQ tablet TAKE (1) TABLET BY MOUTH TWICE DAILY. 60 tablet 0  . ranitidine (ZANTAC) 150 MG capsule Take 150 mg by mouth 2 (two) times daily.     No current facility-administered medications for this visit.   Facility-Administered Medications Ordered in Other Visits  Medication Dose Route Frequency Provider Last Rate Last Admin  . acetaminophen (TYLENOL) tablet 500 mg  500 mg Oral Once Brunetta Genera, MD       And  . oxyCODONE (Oxy IR/ROXICODONE) immediate release tablet 5 mg  5 mg Oral Once Brunetta Genera, MD      . dexamethasone (DECADRON) 10 mg in sodium chloride 0.9 % 50 mL IVPB  10 mg Intravenous Once Brunetta Genera, MD         Physical Exam  Blood pressure 113/73, pulse 82, height 5\' 4"  (1.626 m), weight 163 lb (73.9 kg), last menstrual period  08/12/2010.  Constitutional: overall normal hygiene, normal nutrition, well developed, normal grooming, normal body habitus. Assistive device:none  Musculoskeletal: gait and station Limp none, muscle tone and strength are normal, no tremors or atrophy is present.  .  Neurological: coordination overall normal.  Deep tendon reflex/nerve stretch intact.  Sensation normal.  Cranial nerves II-XII intact.   Skin:   Normal overall no scars, lesions, ulcers or rashes. No psoriasis.  Psychiatric: Alert and oriented x 3.  Recent memory intact, remote memory unclear.  Normal mood and affect. Well groomed.  Good eye contact.  Cardiovascular: overall no swelling, no varicosities, no edema bilaterally,  normal temperatures of the legs and arms, no clubbing, cyanosis and good capillary refill.  Lymphatic: palpation is normal.  Spine/Pelvis examination:  Inspection:  Overall, sacoiliac joint benign and hips nontender; without crepitus or defects.   Thoracic spine inspection: Alignment normal without kyphosis present   Lumbar spine inspection:  Alignment  with normal lumbar lordosis, without scoliosis apparent.   Thoracic spine palpation:  without tenderness of spinal processes   Lumbar spine palpation: without tenderness of lumbar area; without tightness of lumbar muscles    Range of Motion:   Lumbar flexion, forward flexion is normal without pain or tenderness    Lumbar extension is full without pain or tenderness   Left lateral bend is normal without pain or tenderness   Right lateral bend is normal without pain or tenderness   Straight leg raising is normal  Strength & tone: normal   Stability overall normal stability  All other systems reviewed and are negative   The patient has been educated about the nature of the problem(s) and counseled on treatment options.  The patient appeared to understand what I have discussed and is in agreement with it.  Encounter Diagnoses  Name Primary?  .  Chronic bilateral low back pain without sciatica Yes  . Nicotine dependence, cigarettes, uncomplicated     PLAN Call if any problems.  Precautions discussed.  Continue current medications.   Return to clinic 1 month   I have reviewed the [place] web site prior to prescribing narcotic medicine for this patient.   Electronically Signed Sanjuana Kava, MD 3/15/20229:46 AM

## 2020-06-16 ENCOUNTER — Other Ambulatory Visit: Payer: Self-pay | Admitting: Adult Health

## 2020-06-21 ENCOUNTER — Telehealth: Payer: Self-pay | Admitting: Hematology

## 2020-06-21 NOTE — Telephone Encounter (Signed)
Called pt to r/s appt per 3/21 sch msg. No answer. Left msg for pt to call back to r/s.

## 2020-07-12 ENCOUNTER — Telehealth: Payer: Self-pay | Admitting: Hematology

## 2020-07-12 ENCOUNTER — Other Ambulatory Visit: Payer: Self-pay

## 2020-07-12 ENCOUNTER — Encounter: Payer: Self-pay | Admitting: Orthopaedic Surgery

## 2020-07-12 ENCOUNTER — Ambulatory Visit (INDEPENDENT_AMBULATORY_CARE_PROVIDER_SITE_OTHER): Payer: Commercial Managed Care - PPO | Admitting: Orthopaedic Surgery

## 2020-07-12 VITALS — BP 115/67 | HR 80 | Ht 64.0 in | Wt 167.0 lb

## 2020-07-12 DIAGNOSIS — M5441 Lumbago with sciatica, right side: Secondary | ICD-10-CM

## 2020-07-12 DIAGNOSIS — G8929 Other chronic pain: Secondary | ICD-10-CM

## 2020-07-12 MED ORDER — HYDROCODONE-ACETAMINOPHEN 7.5-325 MG PO TABS: ORAL_TABLET | ORAL | 0 refills | Status: DC

## 2020-07-12 NOTE — Progress Notes (Signed)
Patient Amber Drake, female DOB:[birth year], 60 y.o. AJO:[id number]  Chief Complaint  Patient presents with  . Back Pain    Lower back pain, patient reports 9/10 pain and she could not get comfortable,     HPI  Crucita Lacorte is a 60 y.o. female who has more lower back pain with right sided sciatica.  She is much worse.  She is taking naprosyn, Flexeril and pain medicine. She has more pain and more paresthesias.  She has no new trauma, no weakness.  I will get MRI of the lumbar spine.   Body mass index is 28.67 kg/m.  ROS  Review of Systems  Constitutional: Positive for activity change.  Musculoskeletal: Positive for arthralgias and back pain.  All other systems reviewed and are negative.   All other systems reviewed and are negative.  The following is a summary of the past history medically, past history surgically, known current medicines, social history and family history.  This information is gathered electronically by the computer from prior information and documentation.  I review this each visit and have found including this information at this point in the chart is beneficial and informative.    Past Medical History:  Diagnosis Date  . Anemia   . Arthritis   . Blood dyscrasia    sickle cell trait  . Cancer ([place])    lymphoma  . Carbuncle and furuncle   . Hypercholesterolemia   . Hypertension    does not take meds  . Sickle cell trait [facility])     Past Surgical History:  Procedure Laterality Date  . APPENDECTOMY    . APPLICATION OF CRANIAL NAVIGATION N/A 07/28/2018   Procedure: APPLICATION OF CRANIAL NAVIGATION;  Surgeon: Kary Kos, MD;  Location: [place];  Service: Neurosurgery;  Laterality: N/A;  . BRAIN SURGERY    . CARPAL TUNNEL RELEASE  03/23/2011   Procedure: CARPAL TUNNEL RELEASE;  Surgeon: Sanjuana Kava;  Location: [facility];  Service: Orthopedics;  Laterality: Left;  . CARPAL TUNNEL RELEASE  05/10/2011   Procedure: CARPAL TUNNEL RELEASE;  Surgeon: Sanjuana Kava, MD;  Location: [facility];  Service: Orthopedics;  Laterality: Right;  . CESAREAN SECTION     x 2  . INCISION AND DRAINAGE PERIRECTAL ABSCESS  12/21/09  . PR DURAL GRAFT REPAIR,SPINE DEFECT N/A 07/28/2018   Procedure: Stereotactic open biopsy of Right cerebellar hemisphere and dura with brainlab;  Surgeon: Kary Kos, MD;  Location: [place];  Service: Neurosurgery;  Laterality: N/A;  Stereotactic open biopsy of Right cerebellar hemisphere and dura with brainlab  . PROCTOSCOPY  10/17/2010   Procedure: PROCTOSCOPY;  Surgeon: Scherry Ran;  Location: [facility];  Service: General;  Laterality: N/A;  Rigid Proctoscopy/Possible Fistula in Ano  Procedure ended at 1003  . THERAPEUTIC ABORTION     x2    Family History  Problem Relation Age of Onset  . Kidney failure Daughter   . Sickle cell anemia Daughter   . Sickle cell trait Daughter   . Sickle cell anemia Son   . Hypertension Mother   . Hyperlipidemia Mother   . Hypertension Father   . Lupus Father        skin  . Hypertension Other   . Sarcoidosis Sister   . Anesthesia problems Neg Hx   . Hypotension Neg Hx   . Malignant hyperthermia Neg Hx   . Pseudochol deficiency Neg Hx     Social History Social History   Tobacco Use  . Smoking status: Current Every  Day Smoker    Packs/day: 0.50    Years: 30.00    Pack years: 15.00    Types: Cigarettes  . Smokeless tobacco: Never Used  Vaping Use  . Vaping Use: Never used  Substance Use Topics  . Alcohol use: No  . 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 Penicillins Anaphylaxis and Hives    Has patient had a PCN reaction causing immediate rash, facial/tongue/throat swelling, SOB or lightheadedness with hypotension: YES Has patient had a PCN reaction causing severe rash involving mucus membranes or skin necrosis: UNKNOWN  Has patient had a PCN reaction that required hospitalization: NO Has patient had a PCN reaction occurring within  the last 10 years: NO If all of the above answers are "NO", then may proceed with Cephalosporin use.   Marland Kitchen Penicillins Cross Reactors Itching and Swelling    PENICILLIN > ANAPHYLAXIS  . Tape Itching    Current Outpatient Medications  Medication Sig Dispense Refill  . atorvastatin (LIPITOR) 20 MG tablet Take 1 tablet (20 mg total) by mouth daily. 90 tablet 2  . cyclobenzaprine (FLEXERIL) 10 MG tablet Take 1 tablet (10 mg total) by mouth at bedtime. One tablet every night at bedtime as needed for spasm. 30 tablet 0  . ferrous sulfate 325 (65 FE) MG tablet Take 325 mg by mouth daily with breakfast.    . HYDROcodone-acetaminophen (NORCO) 7.5-325 MG tablet One tablet every four hours as needed for pain.  Five day supply for acute pain per [facility]. 30 tablet 0  . ibuprofen (ADVIL) 800 MG tablet Take 1 tablet (800 mg total) by mouth every 8 (eight) hours as needed. 20 tablet 1  . lisinopril-hydrochlorothiazide (PRINZIDE,ZESTORETIC) 20-12.5 MG tablet Take 1 tablet by mouth daily.    . Multiple Vitamin (MULTIVITAMIN WITH MINERALS) TABS tablet Take 1 tablet by mouth daily.    . naproxen (NAPROSYN) 500 MG tablet Take 1 tablet (500 mg total) by mouth 2 (two) times daily with a meal. 60 tablet 5  . potassium chloride SA (KLOR-CON) 20 MEQ tablet TAKE (1) TABLET BY MOUTH TWICE DAILY. 60 tablet 0  . ranitidine (ZANTAC) 150 MG capsule Take 150 mg by mouth 2 (two) times daily.     No current facility-administered medications for this visit.   Facility-Administered Medications Ordered in Other Visits  Medication Dose Route Frequency Provider Last Rate Last Admin  . acetaminophen (TYLENOL) tablet 500 mg  500 mg Oral Once Brunetta Genera, MD       And  . oxyCODONE (Oxy IR/ROXICODONE) immediate release tablet 5 mg  5 mg Oral Once Brunetta Genera, MD      . dexamethasone (DECADRON) 10 mg in sodium chloride 0.9 % 50 mL IVPB  10 mg Intravenous Once Brunetta Genera, MD         Physical  Exam  Blood pressure 115/67, pulse 80, height 5\' 4"  (1.626 m), weight 167 lb (75.8 kg), last menstrual period 08/12/2010.  Constitutional: overall normal hygiene, normal nutrition, well developed, normal grooming, normal body habitus. Assistive device:none  Musculoskeletal: gait and station Limp none, muscle tone and strength are normal, no tremors or atrophy is present.  .  Neurological: coordination overall normal.  Deep tendon reflex/nerve stretch intact.  Sensation normal.  Cranial nerves II-XII intact.   Skin:   Normal overall no scars, lesions, ulcers or rashes. No psoriasis.  Psychiatric: Alert and oriented x 3.  Recent memory intact, remote memory unclear.  Normal mood and affect. Well groomed.  Good eye contact.  Cardiovascular: overall no swelling, no varicosities, no edema bilaterally, normal temperatures of the legs and arms, no clubbing, cyanosis and good capillary refill.  Lymphatic: palpation is normal.  Spine/Pelvis examination:  Inspection:  Overall, sacoiliac joint benign and hips nontender; without crepitus or defects.   Thoracic spine inspection: Alignment normal without kyphosis present   Lumbar spine inspection:  Alignment  with normal lumbar lordosis, without scoliosis apparent.   Thoracic spine palpation:  without tenderness of spinal processes   Lumbar spine palpation: without tenderness of lumbar area; without tightness of lumbar muscles    Range of Motion:   Lumbar flexion, forward flexion is normal without pain or tenderness    Lumbar extension is full without pain or tenderness   Left lateral bend is normal without pain or tenderness   Right lateral bend is normal without pain or tenderness   Straight leg raising is normal  Strength & tone: normal   Stability overall normal stability  All other systems reviewed and are negative   The patient has been educated about the nature of the problem(s) and counseled on treatment options.  The patient  appeared to understand what I have discussed and is in agreement with it.  Encounter Diagnosis  Name Primary?  . Chronic right-sided low back pain with right-sided sciatica Yes    PLAN Call if any problems.  Precautions discussed.  Continue current medications.   Return to clinic 3 weeks   Get MRI of lumbar spine.  I have reviewed the [place] web site prior to prescribing narcotic medicine for this patient.   Electronically Signed Sanjuana Kava, MD 4/12/20228:30 AM

## 2020-07-12 NOTE — Telephone Encounter (Signed)
Called pt to make changes to appt per 4/8 sch msg. No answer. Left msg for pt to call back.

## 2020-07-24 NOTE — Progress Notes (Signed)
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 female who has been referred to [place] by my colleague Dr. Cecil Cobbs in Neuro-Oncology for evaluation and management of her Newly diagnosed Extranodal Marginal Zone Lymphoma. She is accompanied today by her sister Amber Drake via [employer]. The pt reports that she is doing well overall.  Prior to today's visit, the pt presented to the ED on 07/21/18 with complaints of a headache for the past month which presented intermittently and was worsened by bending, coughing, or laughing. She had a CT Head and MRI Brain which revealed extensive dural thickening, as noted below. She then had a right posterior fossa craniectomy on 07/28/18 for open exicisional biopsies of the right cerebellar hemisphere and dura which revealed Extranodal marginal zone lymphoma. The pt then established care with my colleague Dr. Cecil Cobbs in Neuro-Onc on 08/11/18. She has been taking 4mg  Decadron daily.  The pt reports that developed worsened headaches about a month ago, and notes that when she laughed it felt like "electiricty" was running through the sides and back of her head. She notes that some mild headaches had begun a couple months ago. Denies double vision, changes in vision, loss of balance, or other senses being affected. The pt notes that in addition to this, she developed redness in the eyes in the past couple months, saw eye doctor and was prescribed eye drops. Denies other concerns in the last 6 months including bone pains, fatigue, fevers, chills, night sweats or unexpected weight loss. She notes that her headaches have resolved. She notes she began Decadron after her biopsy. She denies having "any symptoms  whatsoever," currently.   Of note prior to the patient's visit today, pt has had an MRI Brain completed on 07/21/18 with results revealing "Extensive dural thickening surrounding the right cerebellar hemisphere with mass-effect and edema in the right cerebellum. The process appears to extend into the upper cervical canal. Mild obstructive hydrocephalus. Differential includes tumor including metastatic disease and lymphoma. Chronic inflammatory process such as sarcoid is a consideration however chest x-ray negative. TB and atypical infection/fungus also possible. 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  On review of systems, pt reports good energy levels, headache resolution, and denies joint issues, skin rashes, dry mouth, dry eyes, fevers, chills, night sweats, bone pains, unexpected weight loss, fatigue, double vision, loss of vision, affected senses, loss of balance, and any other symptoms.   On PMHx the pt reports sickle cel trait, HTN, hyperlipidemia. On Social Hx the pt reports that she smoked 1 ppd for the last 42 years, and reports having quit two days ago. 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 On Family Hx the pt reports dad with lupus and sister with sarcoidosis. Daughter and son with sickle cell disease. Denies other blood disorders or cancers. Endorses Penicillin allergy  Interval History:   Amber Drake returns today for management and evaluation of her CNS Extranodal Marginal Zone Lymphoma.  The patient's last visit with [place] was on 05/24/2020. The pt reports that she is doing well overall. We are joined today by her daughter-in-law.  The pt reports that her PCP has referred her to somebody due to sciatica pain and  imaging. He has ordered an MRI that she is getting this week. The pt notes she lifts and strains often while at work. The pt notes she continues to eat bananas and potassium-rich foods despite the lower potassium levels, but she has been out of the potassium replacement recently.  Lab results today 07/25/2020 of CBC w/diff and CMP is as follows: all values are WNL except for Hgb of 11.8, HCT of 34.7, Potassium of 3.0, Glucose of 103. 07/25/2020 LDH of 190.  On review of systems, pt denies fevers, infection issues, acute headaches, changes in vision, acute neck pains, abdominal pain, leg swelling, and any other symptoms.   MEDICAL HISTORY:  Past Medical History:  Diagnosis Date  . Anemia   . Arthritis   . Blood dyscrasia    sickle cell trait  . Cancer ([place])    lymphoma  . Carbuncle and furuncle   . Hypercholesterolemia   . Hypertension    does not take meds  . Sickle cell trait ([place])     SURGICAL HISTORY: Past Surgical History:  Procedure Laterality Date  . APPENDECTOMY    . APPLICATION OF CRANIAL NAVIGATION N/A 07/28/2018   Procedure: APPLICATION OF CRANIAL NAVIGATION;  Surgeon: Kary Kos, MD;  Location: [facility];  Service: Neurosurgery;  Laterality: N/A;  . BRAIN SURGERY    . CARPAL TUNNEL RELEASE  03/23/2011   Procedure: CARPAL TUNNEL RELEASE;  Surgeon: Sanjuana Kava;  Location: [facility];  Service: Orthopedics;  Laterality: Left;  . CARPAL TUNNEL RELEASE  05/10/2011   Procedure: CARPAL TUNNEL RELEASE;  Surgeon: Sanjuana Kava, MD;  Location: [facility];  Service: Orthopedics;  Laterality: Right;  . CESAREAN SECTION     x 2  . INCISION AND DRAINAGE PERIRECTAL ABSCESS  12/21/09  . PR DURAL GRAFT REPAIR,SPINE DEFECT N/A 07/28/2018   Procedure: Stereotactic open biopsy of Right cerebellar hemisphere and dura with brainlab;  Surgeon: Kary Kos, MD;  Location: [facility];  Service: Neurosurgery;  Laterality: N/A;  Stereotactic open biopsy of Right cerebellar hemisphere and dura with brainlab  .  PROCTOSCOPY  10/17/2010   Procedure: PROCTOSCOPY;  Surgeon: Scherry Ran;  Location: [facility];  Service: General;  Laterality: N/A;  Rigid Proctoscopy/Possible Fistula in Ano  Procedure ended at 1003  . THERAPEUTIC ABORTION     x2    SOCIAL HISTORY: Social History   Socioeconomic History  . Marital status: Married    Spouse name: Not on file  . Number of children: Not on file  . Years of education: Not on file  . Highest education level: Not on file  Occupational History  . Not on file  Tobacco Use  . Smoking status: Current Every Day Smoker    Packs/day: 0.50    Years: 30.00    Pack years: 15.00    Types: Cigarettes  . Smokeless tobacco: Never Used  Vaping Use  . Vaping Use: Never used  Substance and Sexual Activity  . Alcohol use: No  . 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 Sexual activity: Yes    Partners: Male    Birth control/protection: None  Other Topics Concern  . Not on file  Social History Narrative  . Not on file  Social Determinants of Health   Financial Resource Strain: Not on file  Food Insecurity: Not on file  Transportation Needs: Not on file  Physical Activity: Not on file  Stress: Not on file  Social Connections: Not on file  Intimate Partner Violence: Not on file    FAMILY HISTORY: Family History  Problem Relation Age of Onset  . Kidney failure Daughter   . Sickle cell anemia Daughter   . Sickle cell trait Daughter   . Sickle cell anemia Son   . Hypertension Mother   . Hyperlipidemia Mother   . Hypertension Father   . Lupus Father        skin  . Hypertension Other   . Sarcoidosis Sister   . Anesthesia problems Neg Hx   . Hypotension Neg Hx   . Malignant hyperthermia Neg Hx   . Pseudochol deficiency Neg Hx     ALLERGIES:  is allergic to penicillins, penicillins cross reactors, and tape.  MEDICATIONS:  Current Outpatient Medications  Medication Sig Dispense Refill  . atorvastatin (LIPITOR) 20 MG tablet Take 1  tablet (20 mg total) by mouth daily. 90 tablet 2  . cyclobenzaprine (FLEXERIL) 10 MG tablet Take 1 tablet (10 mg total) by mouth at bedtime. One tablet every night at bedtime as needed for spasm. 30 tablet 0  . ferrous sulfate 325 (65 FE) MG tablet Take 325 mg by mouth daily with breakfast.    . HYDROcodone-acetaminophen (NORCO) 7.5-325 MG tablet One tablet every four hours as needed for pain. 30 tablet 0  . ibuprofen (ADVIL) 800 MG tablet Take 1 tablet (800 mg total) by mouth every 8 (eight) hours as needed. 20 tablet 1  . lisinopril-hydrochlorothiazide (PRINZIDE,ZESTORETIC) 20-12.5 MG tablet Take 1 tablet by mouth daily.    . Multiple Vitamin (MULTIVITAMIN WITH MINERALS) TABS tablet Take 1 tablet by mouth daily.    . naproxen (NAPROSYN) 500 MG tablet Take 1 tablet (500 mg total) by mouth 2 (two) times daily with a meal. 60 tablet 5  . potassium chloride SA (KLOR-CON) 20 MEQ tablet TAKE (1) TABLET BY MOUTH TWICE DAILY. 60 tablet 0  . ranitidine (ZANTAC) 150 MG capsule Take 150 mg by mouth 2 (two) times daily.     No current facility-administered medications for this visit.   Facility-Administered Medications Ordered in Other Visits  Medication Dose Route Frequency Provider Last Rate Last Admin  . acetaminophen (TYLENOL) tablet 500 mg  500 mg Oral Once Brunetta Genera, MD       And  . oxyCODONE (Oxy IR/ROXICODONE) immediate release tablet 5 mg  5 mg Oral Once Brunetta Genera, MD      . dexamethasone (DECADRON) 10 mg in sodium chloride 0.9 % 50 mL IVPB  10 mg Intravenous Once Brunetta Genera, MD        REVIEW OF SYSTEMS:   10 Point review of Systems was done is negative except as noted above.  PHYSICAL EXAMINATION: ECOG PERFORMANCE STATUS: 1 - Symptomatic but completely ambulatory  . Vitals:   07/25/20 1047  BP: 122/76  Pulse: 80  Resp: 17  Temp: 97.9 F (36.6 C)  SpO2: 100%   Filed Weights   07/25/20 1047  Weight: 167 lb 12.8 oz (76.1 kg)   .Body mass index is  28.8 kg/m.    GENERAL:alert, in no acute distress and comfortable SKIN: no acute rashes, no significant lesions EYES: conjunctiva are pink and non-injected, sclera anicteric OROPHARYNX: MMM, no exudates, no oropharyngeal erythema or ulceration  NECK: supple, no JVD LYMPH:  no palpable lymphadenopathy in the cervical, axillary or inguinal regions LUNGS: clear to auscultation b/l with normal respiratory effort HEART: regular rate & rhythm ABDOMEN:  normoactive bowel sounds , non tender, not distended. Extremity: no pedal edema PSYCH: alert & oriented x 3 with fluent speech NEURO: no focal motor/sensory deficits  LABORATORY DATA:  I have reviewed the data as listed  . CBC Latest Ref Rng & Units 07/25/2020 05/24/2020 03/23/2020  WBC 4.0 - 10.5 K/uL 4.8 5.0 6.5  Hemoglobin 12.0 - 15.0 g/dL 11.8(L) 12.1 11.5(L)  Hematocrit 36.0 - 46.0 % 34.7(L) 35.9(L) 34.5(L)  Platelets 150 - 400 K/uL 387 386 395    . CMP Latest Ref Rng & Units 07/25/2020 05/24/2020 03/23/2020  Glucose 70 - 99 mg/dL 103(H) 92 98  BUN 6 - 20 mg/dL 16 12 10   Creatinine 0.44 - 1.00 mg/dL 0.78 0.76 0.75  Sodium 135 - 145 mmol/L 141 142 142  Potassium 3.5 - 5.1 mmol/L 3.0(L) 3.4(L) 3.2(L)  Chloride 98 - 111 mmol/L 104 106 106  CO2 22 - 32 mmol/L 27 26 27   Calcium 8.9 - 10.3 mg/dL 9.9 9.9 10.0  Total Protein 6.5 - 8.1 g/dL 7.1 7.0 7.1  Total Bilirubin 0.3 - 1.2 mg/dL 0.3 0.3 0.2(L)  Alkaline Phos 38 - 126 U/L 73 73 77  AST 15 - 41 U/L 20 13(L) 19  ALT 0 - 44 U/L 13 12 15    . Lab Results  Component Value Date   LDH 190 07/25/2020     09/12/18 BM Biopsy:    07/28/18 Right cerebellar hemisphere and dura biopsy:     RADIOGRAPHIC STUDIES: I have personally reviewed the radiological images as listed and agreed with the findings in the report. No results found.  ASSESSMENT & PLAN:  60 y.o. female with  1. 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 The process appears to extend into the upper cervical canal. Mild obstructive hydrocephalus. Differential includes tumor including metastatic disease and lymphoma. Chronic inflammatory process such as sarcoid is a consideration however chest x-ray negative. TB and atypical infection/fungus also possible. 6 mm colloid cyst felt to be a separate problem."  07/25/18 CT C/A/P which did not reveal significant abnormality  09/12/18 BM Bx revealed no evidence of lymphoma  09/08/18 PET/CT revealed "No hypermetabolic mass or adenopathy identified within the chest abdomen or pelvis to suggest metabolically active tumor. 2. Nonspecific focus of increased uptake within the cord extending from T12 to L1. Cannot rule out additional site of CNS lymphoma. Consider further evaluation with contrast enhanced MRI through this area. 3. Aortic Atherosclerosis and Emphysema."  10/16/2018 MRI brain w and w/o contrast revealed "1. Resolved dural mass in the right posterior fossa with minimal smooth dural thickening that may be treatment related. Cerebellar edema and mass effect is also resolved. No new site of disease. 2. 6 mm colloid cyst."  10/20/2018 MRI cervical spine w and w/o contrast revealed "Negative for lymphoma. No acute abnormality. Central disc protrusion at C5-6 effaces the ventral thecal sac causing mild central canal narrowing. There is also a shallow disc bulge at C6-7  which narrows but does not efface the ventral thecal Sac."  01/13/2019 MRI Brain ([id number]) revealed "1. Stable and satisfactory post treatment appearance of  the posterior fossa. 2. Stable small 6 mm colloid cyst. 3. No new intracranial abnormality."  05/06/2019 MRI Cervical Spine ([id number]) revealed "No evidence of lymphoma in the cervical spine. Previously noted dural enhancement in the posterior fossa and cervical canal has resolved. No cord compression or cord lesion. Chronic cervical spine degenerative changes are stable from the prior study."  12/15/2019 MRI Brain ([id number]) revealed "No acute intracranial abnormality. No enhancing mass lesion 5 mm colloid cyst in the third ventricle is chronic and unchanged from prior studies."  PLAN: -Discussed pt labwork today, 07/25/2020; hypokalemic, chemistries and counts stable. LDH normal. -Will schedule her MRI at the place on [address] she last had it at due to needing an open MRI. Discussed regular MRI with use of sedative. The pt is agreeable to this. -Advised pt that HCTZ is most likely causing her hypokalemia. Pt is aware to discuss this dose management with her PCP. -Advised pt that there is no lab or clinical evidence of CNS Extranodal Marginal Zone Lymphoma recurrence at this time. -Will continue maintenance Rituxan q8weeks for up to three years. The pt has no prohibitive toxicities. -Continue to avoid Ibuprofen, use OTC Tylenol for pain management.  -Will send refill for Potassium. The pt notes she has been out of this. Connect with PCP regarding refill of this. -Advised pt we continue to monitor labs and symptoms for progression of disease.  -Advised pt that her type of lymphoma is typically slow growing. -Will see back as scheduled in July. The pt notes that she wishes to move her appt from 07/01 to a different date which would be okay. FOLLOW UP: F/u for MRI of the brain in 2 to 3 weeks Follow-up as per next scheduled appointment for Rituxan, labs and MD visit in about 60 days   The total time spent in the appointment was 20 minutes and more than 50% was on counseling and direct patient  cares.   All of the patient's questions were answered with apparent satisfaction. The patient knows to call the clinic with any problems, questions or concerns.    Sullivan Lone MD [place] AAHIVMS [facility] Hematology/Oncology Physician [facility]  (Office):       [phone] (Work cell):  [phone] (Fax):           [phone]  07/25/2020 12:12 PM  I, Reinaldo Raddle, am acting as scribe for Dr. Sullivan Lone, MD.    .I have reviewed the above documentation for accuracy and completeness, and I agree with the above. Brunetta Genera MD

## 2020-07-25 ENCOUNTER — Other Ambulatory Visit: Payer: Self-pay

## 2020-07-25 ENCOUNTER — Inpatient Hospital Stay: Payer: Commercial Managed Care - PPO

## 2020-07-25 ENCOUNTER — Inpatient Hospital Stay: Payer: Commercial Managed Care - PPO | Attending: Hematology

## 2020-07-25 ENCOUNTER — Inpatient Hospital Stay (HOSPITAL_BASED_OUTPATIENT_CLINIC_OR_DEPARTMENT_OTHER): Payer: Commercial Managed Care - PPO | Admitting: Hematology

## 2020-07-25 VITALS — BP 126/67 | HR 66 | Temp 98.3°F | Resp 16

## 2020-07-25 VITALS — BP 122/76 | HR 80 | Temp 97.9°F | Resp 17 | Ht 64.0 in | Wt 167.8 lb

## 2020-07-25 DIAGNOSIS — C884 Extranodal marginal zone B-cell lymphoma of mucosa-associated lymphoid tissue [MALT-lymphoma]: Secondary | ICD-10-CM

## 2020-07-25 DIAGNOSIS — Z79899 Other long term (current) drug therapy: Secondary | ICD-10-CM | POA: Insufficient documentation

## 2020-07-25 DIAGNOSIS — Z5112 Encounter for antineoplastic immunotherapy: Secondary | ICD-10-CM | POA: Diagnosis not present

## 2020-07-25 DIAGNOSIS — Z7189 Other specified counseling: Secondary | ICD-10-CM

## 2020-07-25 DIAGNOSIS — E876 Hypokalemia: Secondary | ICD-10-CM | POA: Insufficient documentation

## 2020-07-25 DIAGNOSIS — E785 Hyperlipidemia, unspecified: Secondary | ICD-10-CM | POA: Insufficient documentation

## 2020-07-25 DIAGNOSIS — I10 Essential (primary) hypertension: Secondary | ICD-10-CM | POA: Diagnosis not present

## 2020-07-25 DIAGNOSIS — F17210 Nicotine dependence, cigarettes, uncomplicated: Secondary | ICD-10-CM | POA: Diagnosis not present

## 2020-07-25 LAB — CBC WITH DIFFERENTIAL/PLATELET
Abs Immature Granulocytes: 0.01 10*3/uL (ref 0.00–0.07)
Basophils Absolute: 0 10*3/uL (ref 0.0–0.1)
Basophils Relative: 0 %
Eosinophils Absolute: 0.1 10*3/uL (ref 0.0–0.5)
Eosinophils Relative: 2 %
HCT: 34.7 % — ABNORMAL LOW (ref 36.0–46.0)
Hemoglobin: 11.8 g/dL — ABNORMAL LOW (ref 12.0–15.0)
Immature Granulocytes: 0 %
Lymphocytes Relative: 46 %
Lymphs Abs: 2.2 10*3/uL (ref 0.7–4.0)
MCH: 28.4 pg (ref 26.0–34.0)
MCHC: 34 g/dL (ref 30.0–36.0)
MCV: 83.4 fL (ref 80.0–100.0)
Monocytes Absolute: 0.5 10*3/uL (ref 0.1–1.0)
Monocytes Relative: 10 %
Neutro Abs: 2 10*3/uL (ref 1.7–7.7)
Neutrophils Relative %: 42 %
Platelets: 387 10*3/uL (ref 150–400)
RBC: 4.16 MIL/uL (ref 3.87–5.11)
RDW: 12.6 % (ref 11.5–15.5)
WBC: 4.8 10*3/uL (ref 4.0–10.5)
nRBC: 0 % (ref 0.0–0.2)

## 2020-07-25 LAB — CMP (CANCER CENTER ONLY)
ALT: 13 U/L (ref 0–44)
AST: 20 U/L (ref 15–41)
Albumin: 4 g/dL (ref 3.5–5.0)
Alkaline Phosphatase: 73 U/L (ref 38–126)
Anion gap: 10 (ref 5–15)
BUN: 16 mg/dL (ref 6–20)
CO2: 27 mmol/L (ref 22–32)
Calcium: 9.9 mg/dL (ref 8.9–10.3)
Chloride: 104 mmol/L (ref 98–111)
Creatinine: 0.78 mg/dL (ref 0.44–1.00)
GFR, Estimated: >60 mL/min (ref >60)
Glucose, Bld: 103 mg/dL — ABNORMAL HIGH (ref 70–99)
Potassium: 3 mmol/L — ABNORMAL LOW (ref 3.5–5.1)
Sodium: 141 mmol/L (ref 135–145)
Total Bilirubin: 0.3 mg/dL (ref 0.3–1.2)
Total Protein: 7.1 g/dL (ref 6.5–8.1)

## 2020-07-25 LAB — LACTATE DEHYDROGENASE: LDH: 190 U/L (ref 98–192)

## 2020-07-25 MED ORDER — POTASSIUM CHLORIDE CRYS ER 20 MEQ PO TBCR: 40.0000 meq | EXTENDED_RELEASE_TABLET | Freq: Once | ORAL | Status: DC

## 2020-07-25 MED ORDER — DIPHENHYDRAMINE HCL 25 MG PO CAPS
50.0000 mg | ORAL_CAPSULE | Freq: Once | ORAL | Status: AC
  Administered 2020-07-25: 50 mg via ORAL

## 2020-07-25 MED ORDER — ACETAMINOPHEN 325 MG PO TABS
ORAL_TABLET | ORAL | Status: AC
  Filled 2020-07-25: qty 2

## 2020-07-25 MED ORDER — POTASSIUM CHLORIDE CRYS ER 20 MEQ PO TBCR
EXTENDED_RELEASE_TABLET | ORAL | Status: AC
  Filled 2020-07-25: qty 2

## 2020-07-25 MED ORDER — POTASSIUM CHLORIDE 20 MEQ PO PACK: 40.0000 meq | PACK | Freq: Once | ORAL | Status: DC

## 2020-07-25 MED ORDER — SODIUM CHLORIDE 0.9 % IV SOLN
Freq: Once | INTRAVENOUS | Status: AC
  Filled 2020-07-25: qty 250

## 2020-07-25 MED ORDER — FAMOTIDINE IN NACL 20-0.9 MG/50ML-% IV SOLN
20.0000 mg | Freq: Once | INTRAVENOUS | Status: AC
  Administered 2020-07-25: 20 mg via INTRAVENOUS

## 2020-07-25 MED ORDER — POTASSIUM CHLORIDE CRYS ER 20 MEQ PO TBCR
40.0000 meq | EXTENDED_RELEASE_TABLET | Freq: Once | ORAL | Status: AC
  Administered 2020-07-25: 40 meq via ORAL

## 2020-07-25 MED ORDER — POTASSIUM CHLORIDE CRYS ER 20 MEQ PO TBCR: EXTENDED_RELEASE_TABLET | ORAL | 1 refills | Status: DC

## 2020-07-25 MED ORDER — SODIUM CHLORIDE 0.9 % IV SOLN
375.0000 mg/m2 | Freq: Once | INTRAVENOUS | Status: AC
  Administered 2020-07-25: 700 mg via INTRAVENOUS
  Filled 2020-07-25: qty 50

## 2020-07-25 MED ORDER — DIPHENHYDRAMINE HCL 25 MG PO CAPS
ORAL_CAPSULE | ORAL | Status: AC
  Filled 2020-07-25: qty 2

## 2020-07-25 MED ORDER — ACETAMINOPHEN 325 MG PO TABS
650.0000 mg | ORAL_TABLET | Freq: Once | ORAL | Status: AC
  Administered 2020-07-25: 650 mg via ORAL

## 2020-07-25 MED ORDER — SODIUM CHLORIDE 0.9 % IV SOLN
10.0000 mg | Freq: Once | INTRAVENOUS | Status: AC
  Administered 2020-07-25: 10 mg via INTRAVENOUS
  Filled 2020-07-25: qty 10

## 2020-07-25 MED ORDER — FAMOTIDINE IN NACL 20-0.9 MG/50ML-% IV SOLN
INTRAVENOUS | Status: AC
  Filled 2020-07-25: qty 50

## 2020-07-25 NOTE — Patient Instructions (Signed)
[facility] MEDICAL ONCOLOGY  Discharge Instructions: Thank you for choosing [facility] to provide your oncology and hematology care.   If you have a lab appointment with the Cancer Center, please go directly to the Cancer Center and check in at the registration area.   Wear comfortable clothing and clothing appropriate for easy access to any Portacath or PICC line.   We strive to give you quality time with your provider. You may need to reschedule your appointment if you arrive late (15 or more minutes).  Arriving late affects you and other patients whose appointments are after yours.  Also, if you miss three or more appointments without notifying the office, you may be dismissed from the clinic at the provider's discretion.      For prescription refill requests, have your pharmacy contact our office and allow 72 hours for refills to be completed.    Today you received the following chemotherapy and/or immunotherapy agents Rituximab      To help prevent nausea and vomiting after your treatment, we encourage you to take your nausea medication as directed.  BELOW ARE SYMPTOMS THAT SHOULD BE REPORTED IMMEDIATELY: *FEVER GREATER THAN 100.4 F (38 C) OR HIGHER *CHILLS OR SWEATING *NAUSEA AND VOMITING THAT IS NOT CONTROLLED WITH YOUR NAUSEA MEDICATION *UNUSUAL SHORTNESS OF BREATH *UNUSUAL BRUISING OR BLEEDING *URINARY PROBLEMS (pain or burning when urinating, or frequent urination) *BOWEL PROBLEMS (unusual diarrhea, constipation, pain near the anus) TENDERNESS IN MOUTH AND THROAT WITH OR WITHOUT PRESENCE OF ULCERS (sore throat, sores in mouth, or a toothache) UNUSUAL RASH, SWELLING OR PAIN  UNUSUAL VAGINAL DISCHARGE OR ITCHING   Items with * indicate a potential emergency and should be followed up as soon as possible or go to the Emergency Department if any problems should occur.  Please show the CHEMOTHERAPY ALERT CARD or IMMUNOTHERAPY ALERT CARD at check-in to  the Emergency Department and triage nurse.  Should you have questions after your visit or need to cancel or reschedule your appointment, please contact [facility] MEDICAL ONCOLOG[facility]  Dept: [phone]  and follow the prompts.  Office hours are 8:00 a.m. to 4:30 p.m. Monday - Friday. Please note that voicemails left after 4:00 p.m. may not be returned until the following business day.  We are closed weekends and major holidays. You have access to a nurse at all times for urgent questions. Please call the main number to the clinic Dept: [phone] and follow the prompts.   For any non-urgent questions, you may also contact your provider using MyChart. We now offer e-Visits for anyone 18 and older to request care online for non-urgent symptoms. For details visit [email or web address].   Also download the MyChart app! Go to the app store, search "MyChart", open the app, select [facility], and log in with your MyChart username and password.  Due to Covid, a mask is required upon entering the hospital/clinic. If you do not have a mask, one will be given to you upon arrival. For doctor visits, patients may have 1 support person aged 18 or older with them. For treatment visits, patients cannot have anyone with them due to current Covid guidelines and our immunocompromised population.   

## 2020-07-27 ENCOUNTER — Ambulatory Visit (HOSPITAL_COMMUNITY)
Admission: RE | Admit: 2020-07-27 | Discharge: 2020-07-27 | Disposition: A | Discharge status: Home / Self Care | Payer: Commercial Managed Care - PPO | Source: Ambulatory Visit | Attending: Orthopaedic Surgery | Admitting: Orthopaedic Surgery

## 2020-07-27 ENCOUNTER — Telehealth: Payer: Self-pay | Admitting: Hematology

## 2020-07-27 DIAGNOSIS — M5441 Lumbago with sciatica, right side: Secondary | ICD-10-CM | POA: Insufficient documentation

## 2020-07-27 DIAGNOSIS — G8929 Other chronic pain: Secondary | ICD-10-CM

## 2020-07-27 IMAGING — MR MR LUMBAR SPINE W/O CM
5 series · 45 of 48 positions shown · non-contrast
Comparison: None.

CLINICAL DATA: Low back pain.

EXAM:
MRI LUMBAR SPINE WITHOUT CONTRAST
TECHNIQUE: Multiplanar, multisequence MR imaging of the lumbar spine was
performed. No intravenous contrast was administered.

[Series 5: T2 · sagittal · 4.0mm · 0.81mm/px · 6 of 15 slices shown (1 of 2)]
[im 1/15]
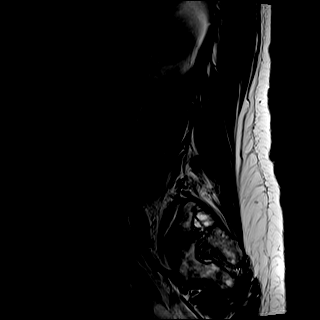
[im 3/15]
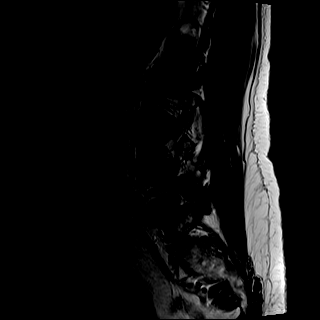
[im 6/15]
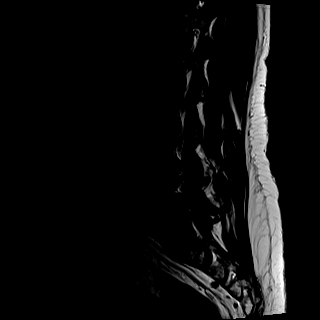
[im 9/15]
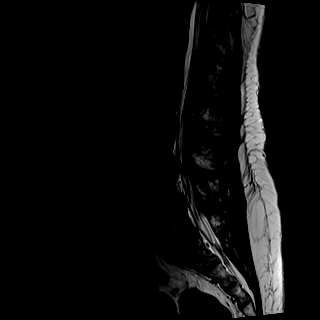
[im 12/15]
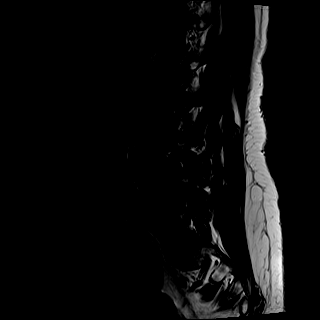
[im 15/15]
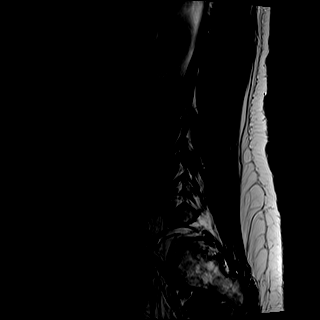

[Series 6: T1 · sagittal · 4.0mm · 1.02mm/px · 6 of 15 slices shown (1 of 2)]
[im 1/15]
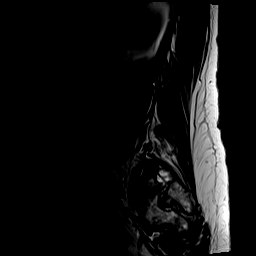
[im 3/15]
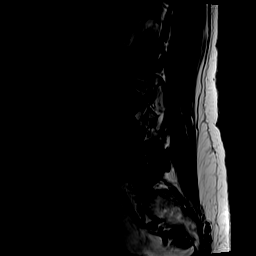
[im 6/15]
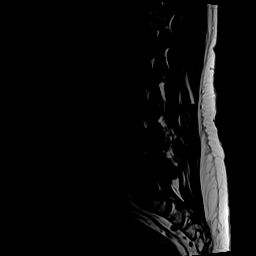
[im 9/15]
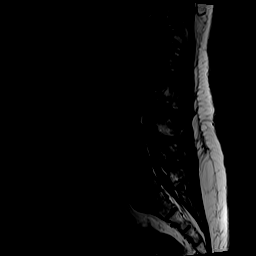
[im 12/15]
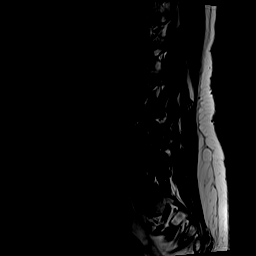
[im 15/15]
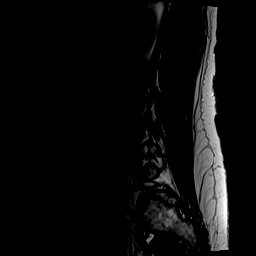

[Series 7: STIR · sagittal · 4.0mm · 0.51mm/px · 6 of 15 slices shown]
[im 1/15]
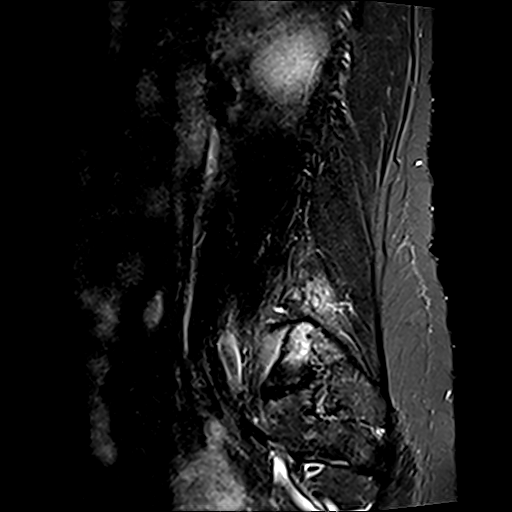
[im 3/15]
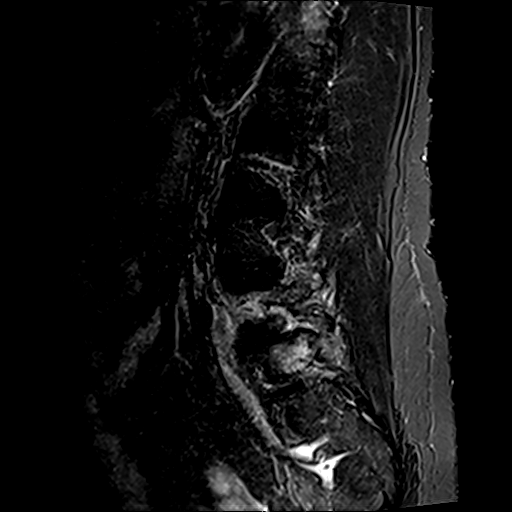
[im 6/15]
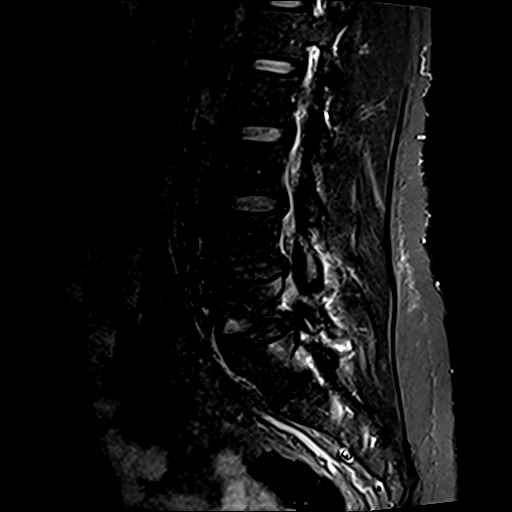
[im 9/15]
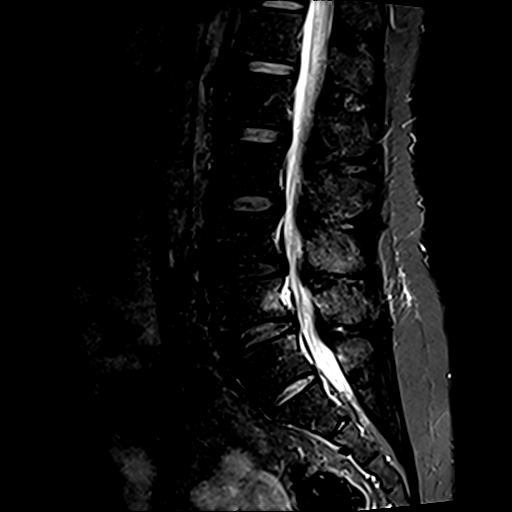
[im 12/15]
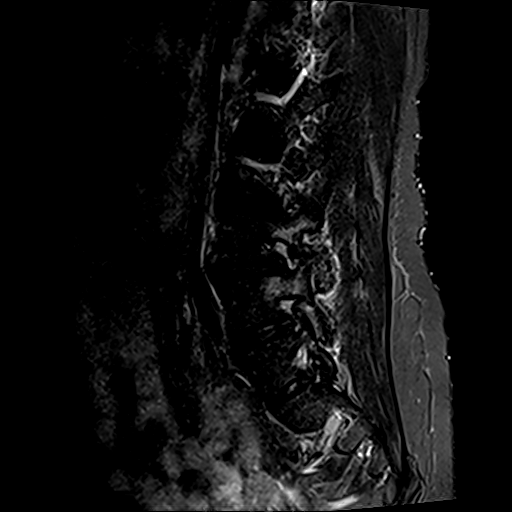
[im 15/15]
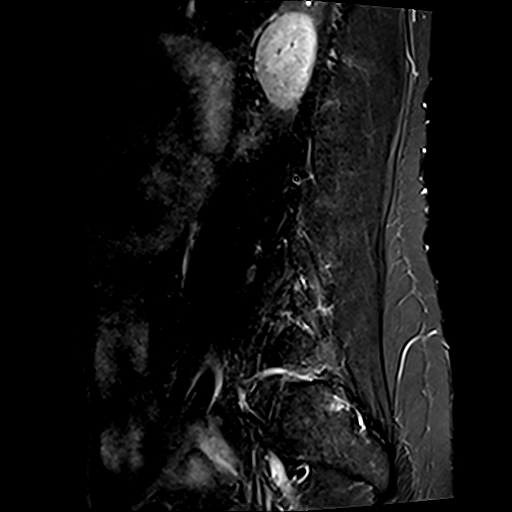

[Series 8: T2 · axial · 4.0mm · 0.70mm/px · z∈[+65,+272]mm · 15 of 35 slices shown (2 of 2)]
[im 1/35]
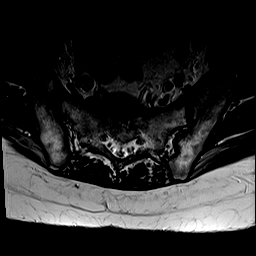
[im 3/35]
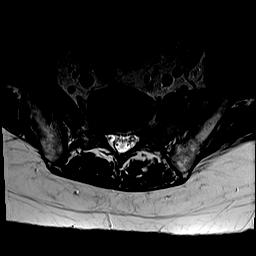
[im 5/35]
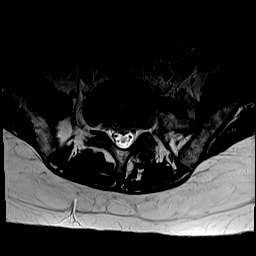
[im 8/35]
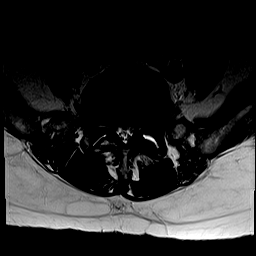
[im 10/35]
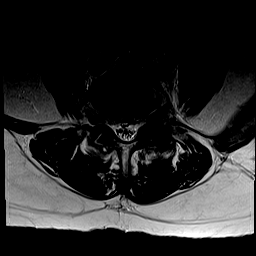
[im 13/35]
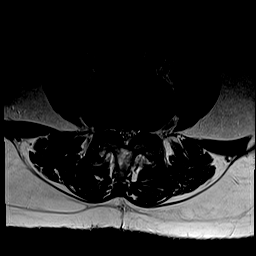
[im 15/35]
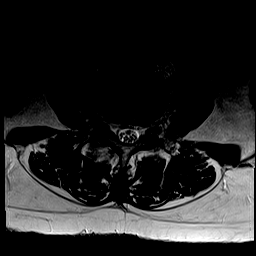
[im 18/35]
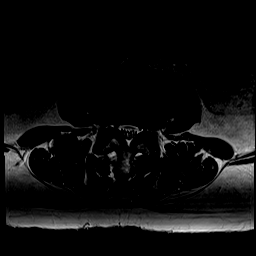
[im 20/35]
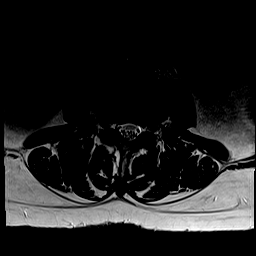
[im 22/35]
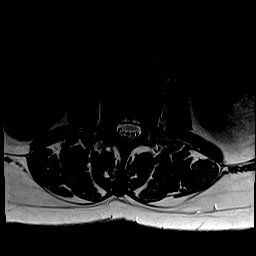
[im 25/35]
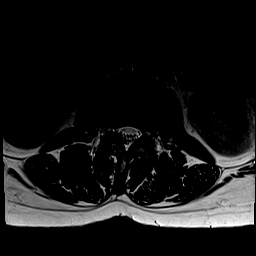
[im 27/35]
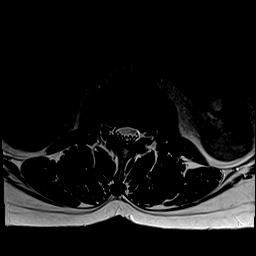
[im 30/35]
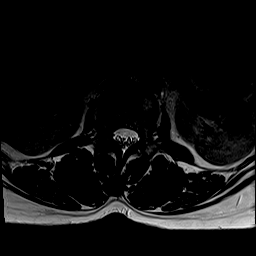
[im 32/35]
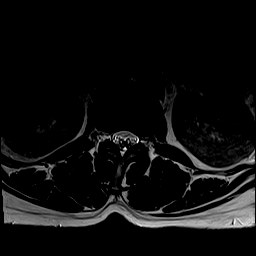
[im 35/35]
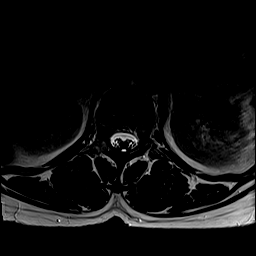

[Series 9: T1 · axial · 4.0mm · 0.70mm/px · z∈[+65,+272]mm · 12 of 35 slices shown (2 of 2)]
[im 1/35]
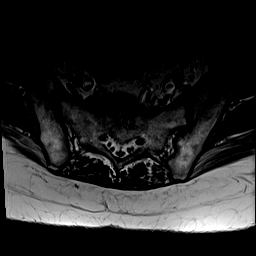
[im 3/35]
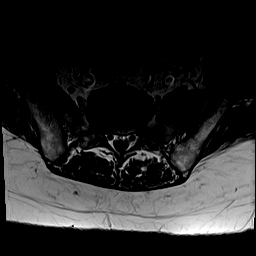
[im 5/35]
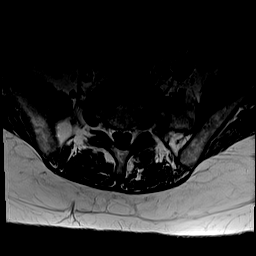
[im 8/35]
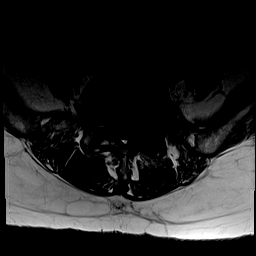
[im 10/35]
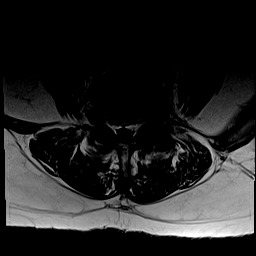
[im 13/35]
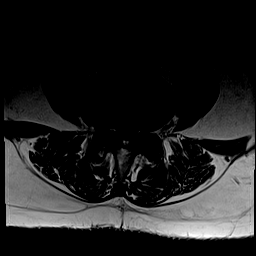
[im 15/35]
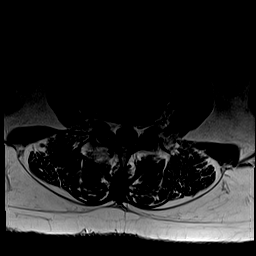
[im 18/35]
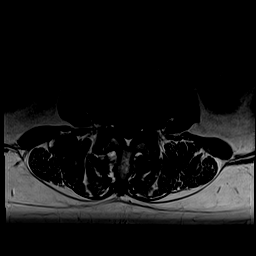
[im 20/35]
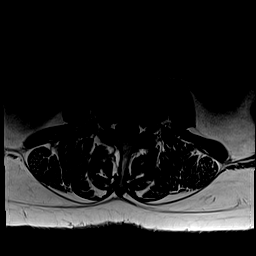
[im 25/35]
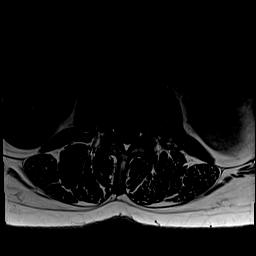
[im 30/35]
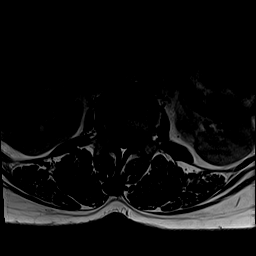
[im 35/35]
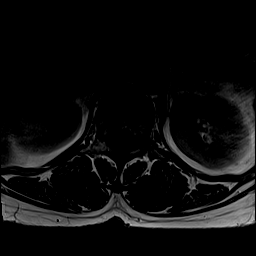

[45 of 48 positions shown; findings below may reference images not displayed]

FINDINGS: Segmentation: A transitional lumbosacral vertebra is assumed to
represent the S1 level. Careful correlation with this numbering
strategy prior to any procedural intervention would be recommended.

Alignment:  Physiologic.

Vertebrae: No fracture, evidence of discitis, or bone lesion. Marrow
and soft tissue edema about the facet joints at L4-5 and on the left
at L5-S1.

Conus medullaris and cauda equina: Conus extends to the L1-2 level.
Conus and cauda equina appear normal.

Paraspinal and other soft tissues: Bilateral renal cysts.

Disc levels:

L1-2: No spinal canal or neural foraminal stenosis.

L2-3: Mild facet degenerative changes. No spinal canal or neural
foraminal stenosis.

L3-4: Shallow disc bulge and mild facet degenerative changes without
significant spinal canal or neural foraminal stenosis.

L4-5: Disc bulge, hypertrophic facet degenerative changes and
ligamentum flavum redundancy resulting in mild spinal canal stenosis
and mild bilateral neural foraminal narrowing.

L5-S1: Disc bulge with superimposed left foraminal disc protrusion,
hypertrophic facet degenerative changes with bilateral joint
effusion and ligamentum flavum redundancy resulting in mild spinal
canal stenosis with narrowing of the bilateral subarticular zones,
and moderate to severe left neural foraminal impinging on the X left
L5 nerve root.

S1-2: No spinal canal or neural foraminal stenosis.
IMPRESSION: 1. A transitional lumbosacral vertebra is assumed to represent the
S1 level. Careful correlation with this numbering strategy prior to
any procedural intervention would be recommended.
2. Mild spinal canal stenosis at L4-5 and L5-S1 with narrowing of
the bilateral subarticular zones at L5-S1 and moderate to severe
left neural foraminal narrowing at L5-S1 impinging on the exiting
left L5 nerve root.
3. Lower lumbar facet arthritis.

## 2020-07-27 NOTE — Telephone Encounter (Signed)
Scheduled follow-up appointment per 4/25 los. Patient is aware. ?

## 2020-08-02 ENCOUNTER — Ambulatory Visit (INDEPENDENT_AMBULATORY_CARE_PROVIDER_SITE_OTHER): Payer: Commercial Managed Care - PPO | Admitting: Orthopaedic Surgery

## 2020-08-02 ENCOUNTER — Encounter: Payer: Self-pay | Admitting: Orthopaedic Surgery

## 2020-08-02 ENCOUNTER — Other Ambulatory Visit: Payer: Self-pay

## 2020-08-02 VITALS — BP 120/69 | HR 72 | Ht 64.0 in | Wt 168.4 lb

## 2020-08-02 DIAGNOSIS — F17210 Nicotine dependence, cigarettes, uncomplicated: Secondary | ICD-10-CM

## 2020-08-02 DIAGNOSIS — G8929 Other chronic pain: Secondary | ICD-10-CM

## 2020-08-02 DIAGNOSIS — M5441 Lumbago with sciatica, right side: Secondary | ICD-10-CM | POA: Diagnosis not present

## 2020-08-02 MED ORDER — HYDROCODONE-ACETAMINOPHEN 7.5-325 MG PO TABS: ORAL_TABLET | ORAL | 0 refills | Status: DC

## 2020-08-02 NOTE — Addendum Note (Signed)
Addended by: Dessie Coma on: 08/02/2020 08:17 AM   Modules accepted: Orders

## 2020-08-02 NOTE — Patient Instructions (Signed)

## 2020-08-02 NOTE — Progress Notes (Signed)
Patient PP:[id number] Amber Drake, female DOB:04/07/1960, 60 y.o. ZTI:[id number]  Chief Complaint  Patient presents with  . Back Pain    Back is still hurting/ here is to go over MRI results    HPI  Amber Drake is a 60 y.o. female who has lower back pain.  She had MRI done which showed:  IMPRESSION: 1. 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 Careful correlation with this numbering strategy prior to any procedural intervention would be recommended. 2. Mild spinal canal stenosis at L4-5 and L5-S1 with narrowing of the bilateral subarticular zones at L5-S1 and moderate to severe left neural foraminal narrowing at L5-S1 impinging on the exiting left L5 nerve root. 3. Lower lumbar facet arthritis.  Her pain is on the right side.  I have explained the findings to her.  I will have her see the neurosurgeon.     Body mass index is 28.9 kg/m.  ROS  Review of Systems  All other systems reviewed and are negative.  The following is a summary of the past history medically, past history surgically, known current medicines, social history and family history.  This information is gathered electronically by the computer from prior information and documentation.  I review this each visit and have found including this information at this point in the chart is beneficial and informative.    Past Medical History:  Diagnosis Date  . Anemia   . Arthritis   . Blood dyscrasia    sickle cell trait  . Cancer ([place])    lymphoma  . Carbuncle and furuncle   . Hypercholesterolemia   . Hypertension    does not take meds  . Sickle cell trait [facility])     Past Surgical History:  Procedure Laterality Date  . APPENDECTOMY    . APPLICATION OF CRANIAL NAVIGATION N/A 07/28/2018   Procedure: APPLICATION OF CRANIAL NAVIGATION;  Surgeon: Kary Kos, MD;  Location: [facility];  Service: Neurosurgery;  Laterality: N/A;  . BRAIN SURGERY    . CARPAL TUNNEL RELEASE  03/23/2011    Procedure: CARPAL TUNNEL RELEASE;  Surgeon: Sanjuana Kava;  [facility];  Service: Orthopedics;  Laterality: Left;  . CARPAL TUNNEL RELEASE  05/10/2011   Procedure: CARPAL TUNNEL RELEASE;  Surgeon: Sanjuana Kava, MD;  Location[facility];  Service: Orthopedics;  Laterality: Right;  . CESAREAN SECTION     x 2  . INCISION AND DRAINAGE PERIRECTAL ABSCESS  12/21/09  . PR DURAL GRAFT REPAIR,SPINE DEFECT N/A 07/28/2018   Procedure: Stereotactic open biopsy of Right cerebellar hemisphere and dura with brainlab;  Surgeon: Kary Kos, MD;  Location: [place];  Service: Neurosurgery;  Laterality: N/A;  Stereotactic open biopsy of Right cerebellar hemisphere and dura with brainlab  . PROCTOSCOPY  10/17/2010   Procedure: PROCTOSCOPY;  Surgeon: Scherry Ran;  Location: [facility];  Service: General;  Laterality: N/A;  Rigid Proctoscopy/Possible Fistula in Ano  Procedure ended at 1003  . THERAPEUTIC ABORTION     x2    Family History  Problem Relation Age of Onset  . Kidney failure Daughter   . Sickle cell anemia Daughter   . Sickle cell trait Daughter   . Sickle cell anemia Son   . Hypertension Mother   . Hyperlipidemia Mother   . Hypertension Father   . Lupus Father        skin  . Hypertension Other   . Sarcoidosis Sister   . Anesthesia problems Neg Hx   . Hypotension Neg  Hx   . Malignant hyperthermia Neg Hx   . Pseudochol deficiency Neg Hx     Social History Social History   Tobacco Use  . Smoking status: Current Every Day Smoker    Packs/day: 0.50    Years: 30.00    Pack years: 15.00    Types: Cigarettes  . Smokeless tobacco: Never Used  Vaping Use  . Vaping Use: Never used  Substance Use Topics  . Alcohol use: No  . 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 Penicillins Anaphylaxis and Hives    Has patient had a PCN reaction causing immediate rash, facial/tongue/throat swelling, SOB or lightheadedness with hypotension: YES Has patient  had a PCN reaction causing severe rash involving mucus membranes or skin necrosis: UNKNOWN  Has patient had a PCN reaction that required hospitalization: NO Has patient had a PCN reaction occurring within the last 10 years: NO If all of the above answers are "NO", then may proceed with Cephalosporin use.   Marland Kitchen Penicillins Cross Reactors Itching and Swelling    PENICILLIN > ANAPHYLAXIS  . Tape Itching    Current Outpatient Medications  Medication Sig Dispense Refill  . atorvastatin (LIPITOR) 20 MG tablet Take 1 tablet (20 mg total) by mouth daily. 90 tablet 2  . cyclobenzaprine (FLEXERIL) 10 MG tablet Take 1 tablet (10 mg total) by mouth at bedtime. One tablet every night at bedtime as needed for spasm. 30 tablet 0  . ferrous sulfate 325 (65 FE) MG tablet Take 325 mg by mouth daily with breakfast.    . HYDROcodone-acetaminophen (NORCO) 7.5-325 MG tablet One tablet every four hours as needed for pain. 30 tablet 0  . ibuprofen (ADVIL) 800 MG tablet Take 1 tablet (800 mg total) by mouth every 8 (eight) hours as needed. 20 tablet 1  . lisinopril-hydrochlorothiazide (PRINZIDE,ZESTORETIC) 20-12.5 MG tablet Take 1 tablet by mouth daily.    . Multiple Vitamin (MULTIVITAMIN WITH MINERALS) TABS tablet Take 1 tablet by mouth daily.    . naproxen (NAPROSYN) 500 MG tablet Take 1 tablet (500 mg total) by mouth 2 (two) times daily with a meal. 60 tablet 5  . potassium chloride SA (KLOR-CON) 20 MEQ tablet TAKE (1) TABLET BY MOUTH TWICE DAILY. 60 tablet 1  . ranitidine (ZANTAC) 150 MG capsule Take 150 mg by mouth 2 (two) times daily.     No current facility-administered medications for this visit.   Facility-Administered Medications Ordered in Other Visits  Medication Dose Route Frequency Provider Last Rate Last Admin  . acetaminophen (TYLENOL) tablet 500 mg  500 mg Oral Once Brunetta Genera, MD       And  . oxyCODONE (Oxy IR/ROXICODONE) immediate release tablet 5 mg  5 mg Oral Once Brunetta Genera, MD      . dexamethasone (DECADRON) 10 mg in sodium chloride 0.9 % 50 mL IVPB  10 mg Intravenous Once Brunetta Genera, MD         Physical Exam  Blood pressure 120/69, pulse 72, height 5\' 4"  (1.626 m), weight 168 lb 6 oz (76.4 kg), last menstrual period 08/12/2010.  Constitutional: overall normal hygiene, normal nutrition, well developed, normal grooming, normal body habitus. Assistive device:none  Musculoskeletal: gait and station Limp none, muscle tone and strength are normal, no tremors or atrophy is present.  .  Neurological: coordination overall normal.  Deep tendon reflex/nerve stretch intact.  Sensation normal.  Cranial nerves  II-XII intact.   Skin:   Normal overall no scars, lesions, ulcers or rashes. No psoriasis.  Psychiatric: Alert and oriented x 3.  Recent memory intact, remote memory unclear.  Normal mood and affect. Well groomed.  Good eye contact.  Cardiovascular: overall no swelling, no varicosities, no edema bilaterally, normal temperatures of the legs and arms, no clubbing, cyanosis and good capillary refill.  Lymphatic: palpation is normal.  Spine/Pelvis examination:  Inspection:  Overall, sacoiliac joint benign and hips nontender; without crepitus or defects.   Thoracic spine inspection: Alignment normal without kyphosis present   Lumbar spine inspection:  Alignment  with normal lumbar lordosis, without scoliosis apparent.   Thoracic spine palpation:  without tenderness of spinal processes   Lumbar spine palpation: without tenderness of lumbar area; without tightness of lumbar muscles    Range of Motion:   Lumbar flexion, forward flexion is normal without pain or tenderness    Lumbar extension is full without pain or tenderness   Left lateral bend is normal without pain or tenderness   Right lateral bend is normal without pain or tenderness   Straight leg raising is normal  Strength & tone: normal   Stability overall normal stability  All  other systems reviewed and are negative   The patient has been educated about the nature of the problem(s) and counseled on treatment options.  The patient appeared to understand what I have discussed and is in agreement with it.  Encounter Diagnoses  Name Primary?  . Chronic right-sided low back pain with right-sided sciatica Yes  . Nicotine dependence, cigarettes, uncomplicated     PLAN Call if any problems.  Precautions discussed.  Continue current medications.   Return to clinic 6 weeks   To see neurosurgeon.  I have reviewed the [place] web site prior to prescribing narcotic medicine for this patient.   Electronically Signed Sanjuana Kava, MD 5/3/20228:14 AM

## 2020-08-14 ENCOUNTER — Other Ambulatory Visit: Payer: Self-pay

## 2020-08-14 ENCOUNTER — Ambulatory Visit
Admission: RE | Admit: 2020-08-14 | Discharge: 2020-08-14 | Disposition: A | Discharge status: Home / Self Care | Payer: Commercial Managed Care - PPO | Source: Ambulatory Visit | Attending: Hematology | Admitting: Hematology

## 2020-08-14 DIAGNOSIS — C884 Extranodal marginal zone B-cell lymphoma of mucosa-associated lymphoid tissue [MALT-lymphoma]: Secondary | ICD-10-CM

## 2020-08-14 IMAGING — MR MR HEAD WO/W CM
13 series · 48 of 48 positions shown · IV contrast (multihance)
Comparison: [DATE]

CLINICAL DATA: Marginal zone lymphoma.  Assess treatment response.

EXAM:
MRI HEAD WITHOUT AND WITH CONTRAST
TECHNIQUE: Multiplanar, multiecho pulse sequences of the brain and surrounding
structures were obtained without and with intravenous contrast.
CONTRAST:  15mL MULTIHANCE GADOBENATE DIMEGLUMINE 529 MG/ML IV SOLN

[Series 2: T1 · sagittal · 5.0mm · 0.47mm/px · 2 of 25 slices shown]
[im 1/25]
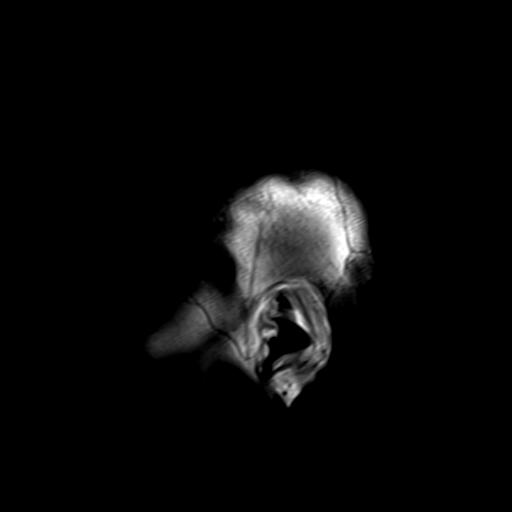
[im 25/25]
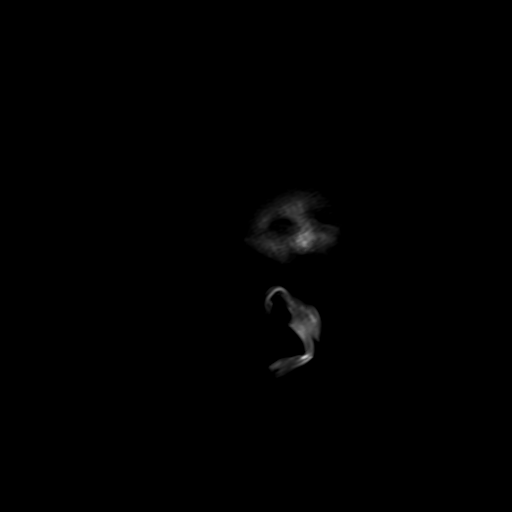

[Series 3: ax ep2d_diff_3 · axial · 3.0mm · 1.88mm/px · z∈[-48,+108]mm · 6 of 102 slices shown]
[im 1/102]
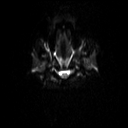
[im 21/102]
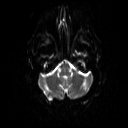
[im 41/102]
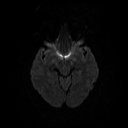
[im 61/102]
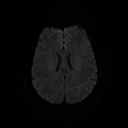
[im 81/102]
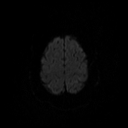
[im 102/102]
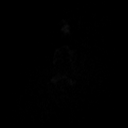

[Series 4: ax ep2d_diff_3_adc · axial · 3.0mm · 1.88mm/px · z∈[-51,+108]mm · 3 of 54 slices shown]
[im 1/54]
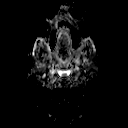
[im 27/54]
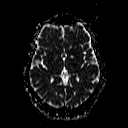
[im 54/54]
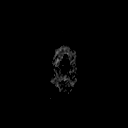

[Series 5: cor ep2d_diff · coronal · 5.0mm · 1.77mm/px · 3 of 56 slices shown]
[im 1/56]
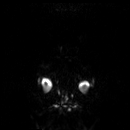
[im 28/56]
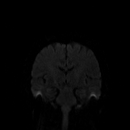
[im 56/56]
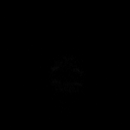

[Series 6: cor ep2d_diff_adc · coronal · 5.0mm · 1.77mm/px · 2 of 28 slices shown]
[im 1/28]
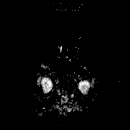
[im 28/28]
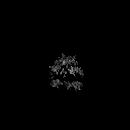

[Series 8: swi_images · axial · 2.0mm · 0.98mm/px · z∈[-81,+74]mm · 5 of 80 slices shown]
[im 1/80]
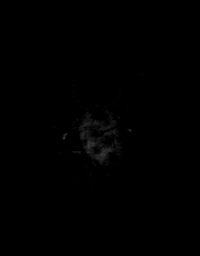
[im 20/80]
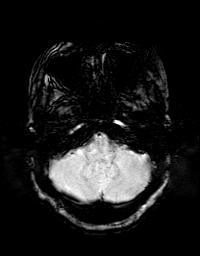
[im 40/80]
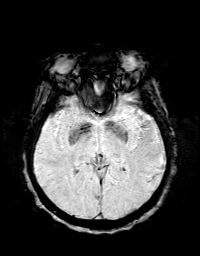
[im 60/80]
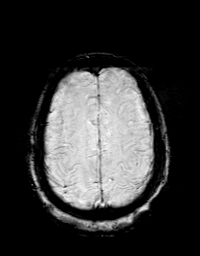
[im 80/80]
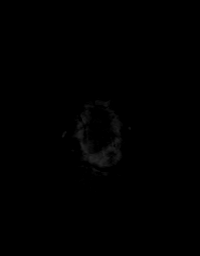

[Series 9: FLAIR · axial · 3.0mm · 0.47mm/px · z∈[-74,+75]mm · 2 of 40 slices shown]
[im 1/40]
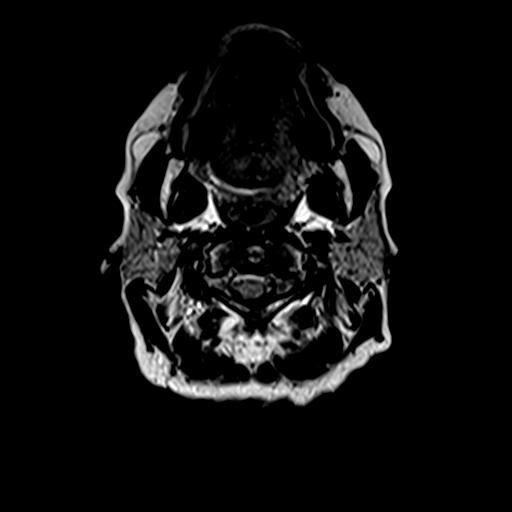
[im 40/40]
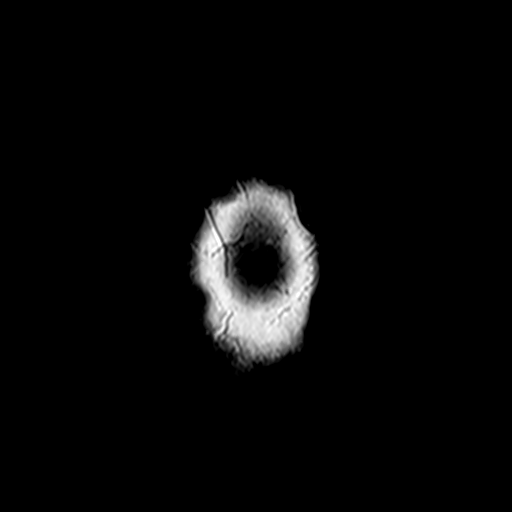

[Series 10: T2 · axial · 5.0mm · 0.78mm/px · z∈[-86,+79]mm · 2 of 29 slices shown (1 of 2)]
[im 1/29]
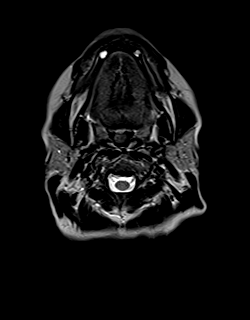
[im 29/29]
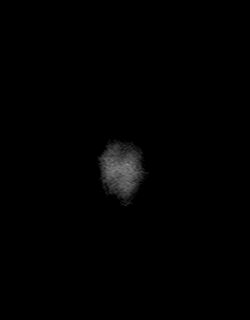

[Series 11: t1_mpr_tra · axial · 1.0mm · 0.94mm/px · z∈[-57,+101]mm · 9 of 160 slices shown]
[im 1/160]
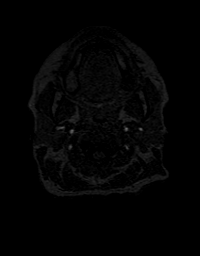
[im 20/160]
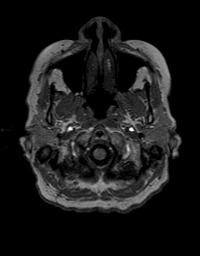
[im 40/160]
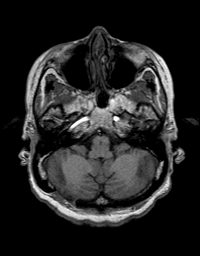
[im 60/160]
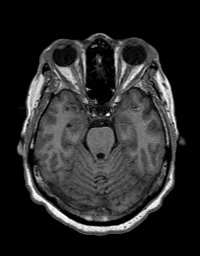
[im 80/160]
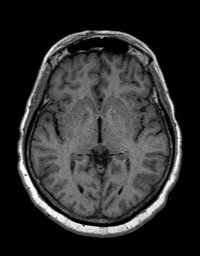
[im 100/160]
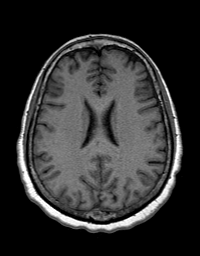
[im 120/160]
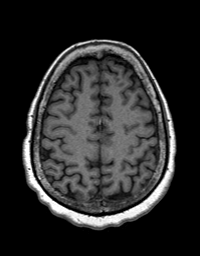
[im 140/160]
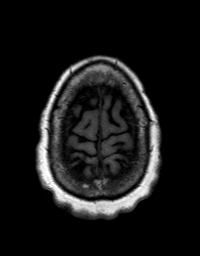
[im 160/160]
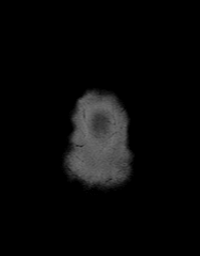

[Series 12: T2 · coronal · 5.0mm · 0.45mm/px · 2 of 29 slices shown (2 of 2)]
[im 1/29]
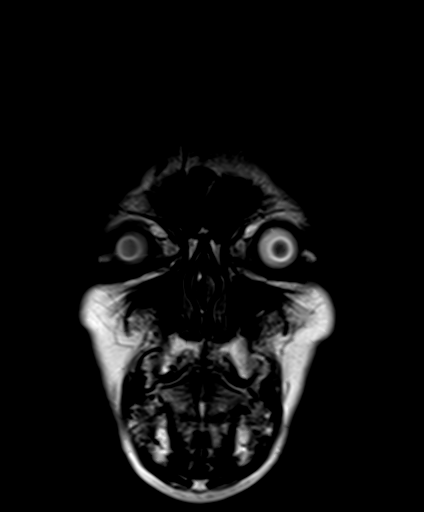
[im 29/29]
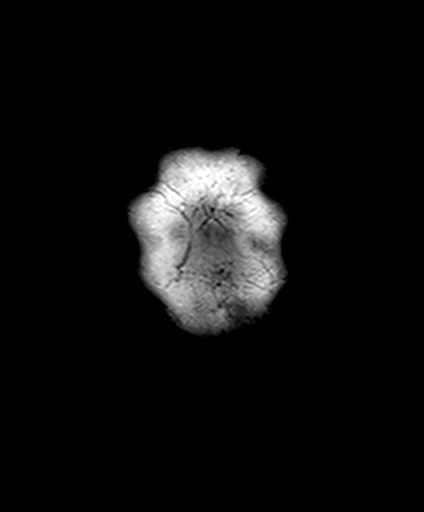

[Series 13: T1 post-contrast · coronal · 5.0mm · 0.90mm/px · 2 of 26 slices shown]
[im 1/26]
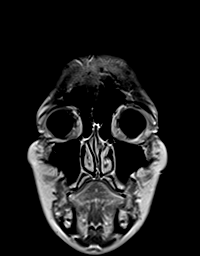
[im 26/26]
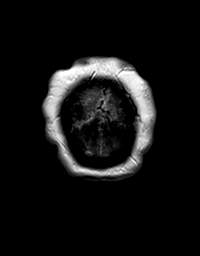

[Series 14: post t1_mpr_tra · axial · 1.0mm · 0.94mm/px · z∈[-57,+101]mm · 9 of 160 slices shown]
[im 1/160]
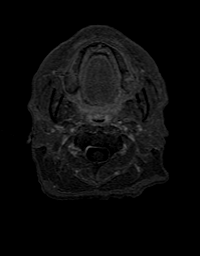
[im 20/160]
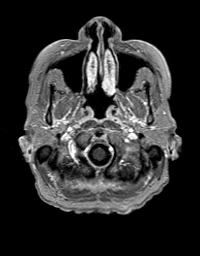
[im 40/160]
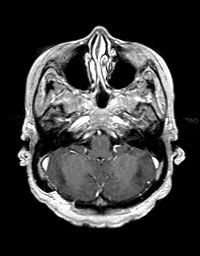
[im 60/160]
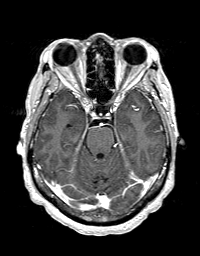
[im 80/160]
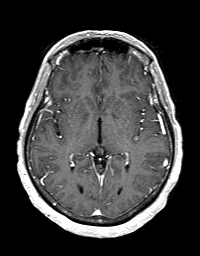
[im 100/160]
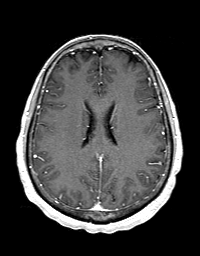
[im 120/160]
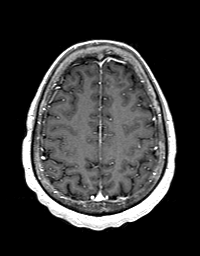
[im 140/160]
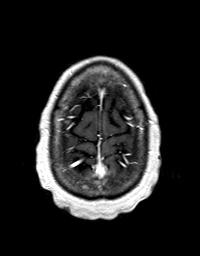
[im 160/160]
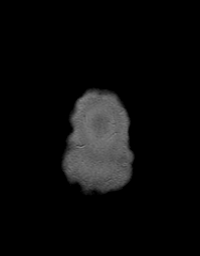

[Series 15: t1_se_sag post · sagittal · 5.0mm · 0.47mm/px · 1 of 25 slices shown]
[im 1/25]
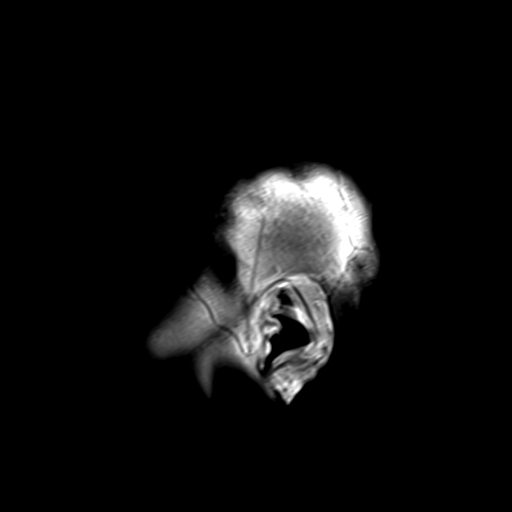

[48 of 48 positions shown; findings below may reference images not displayed]

FINDINGS: Brain: Postoperative changes are again seen from a right
suboccipital craniotomy without a recurrent mass or suspicious dural
thickening in the posterior fossa. A 6 mm third ventricular colloid
cyst is unchanged without hydrocephalus. No acute infarct,
intracranial hemorrhage, midline shift, or extra-axial fluid
collection is identified. Minimal scattered punctate foci of T2
FLAIR hyperintensity in the cerebral white matter are similar to the
prior study and nonspecific.

Vascular: Major intracranial vascular flow voids are preserved.

Skull and upper cervical spine: No suspicious marrow lesion.

Sinuses/Orbits: Unremarkable orbits. Paranasal sinuses and mastoid
air cells are clear.

Other: None.
IMPRESSION: 1. Unchanged appearance of the brain. No evidence of recurrent
intracranial lymphoma.
2. Small colloid cyst without hydrocephalus.

## 2020-08-14 MED ORDER — GADOBENATE DIMEGLUMINE 529 MG/ML IV SOLN
15.0000 mL | Freq: Once | INTRAVENOUS | Status: AC | PRN
  Administered 2020-08-14: 15 mL via INTRAVENOUS

## 2020-08-16 ENCOUNTER — Other Ambulatory Visit (HOSPITAL_COMMUNITY)
Admission: RE | Admit: 2020-08-16 | Discharge: 2020-08-16 | Disposition: A | Discharge status: Home / Self Care | Payer: Commercial Managed Care - PPO | Source: Ambulatory Visit | Attending: Adult Health | Admitting: Adult Health

## 2020-08-16 ENCOUNTER — Encounter (HOSPITAL_COMMUNITY): Payer: Self-pay | Admitting: *Deleted

## 2020-08-16 ENCOUNTER — Other Ambulatory Visit: Payer: Self-pay

## 2020-08-16 ENCOUNTER — Ambulatory Visit (INDEPENDENT_AMBULATORY_CARE_PROVIDER_SITE_OTHER): Payer: Commercial Managed Care - PPO | Admitting: Adult Health

## 2020-08-16 ENCOUNTER — Encounter: Payer: Self-pay | Admitting: Adult Health

## 2020-08-16 ENCOUNTER — Emergency Department (HOSPITAL_COMMUNITY)
Admission: EM | Admit: 2020-08-16 | Discharge: 2020-08-16 | Disposition: A | Discharge status: Home / Self Care | Payer: Commercial Managed Care - PPO | Attending: Emergency Medicine | Admitting: Emergency Medicine

## 2020-08-16 VITALS — BP 118/72 | HR 82 | Ht 67.0 in | Wt 166.0 lb

## 2020-08-16 DIAGNOSIS — B9689 Other specified bacterial agents as the cause of diseases classified elsewhere: Secondary | ICD-10-CM

## 2020-08-16 DIAGNOSIS — K649 Unspecified hemorrhoids: Secondary | ICD-10-CM | POA: Diagnosis not present

## 2020-08-16 DIAGNOSIS — H109 Unspecified conjunctivitis: Secondary | ICD-10-CM

## 2020-08-16 DIAGNOSIS — I10 Essential (primary) hypertension: Secondary | ICD-10-CM | POA: Diagnosis not present

## 2020-08-16 DIAGNOSIS — Z1211 Encounter for screening for malignant neoplasm of colon: Secondary | ICD-10-CM | POA: Insufficient documentation

## 2020-08-16 DIAGNOSIS — H02841 Edema of right upper eyelid: Secondary | ICD-10-CM | POA: Insufficient documentation

## 2020-08-16 DIAGNOSIS — F17210 Nicotine dependence, cigarettes, uncomplicated: Secondary | ICD-10-CM | POA: Diagnosis not present

## 2020-08-16 DIAGNOSIS — Z01419 Encounter for gynecological examination (general) (routine) without abnormal findings: Secondary | ICD-10-CM | POA: Diagnosis not present

## 2020-08-16 DIAGNOSIS — Z1212 Encounter for screening for malignant neoplasm of rectum: Secondary | ICD-10-CM

## 2020-08-16 DIAGNOSIS — H1033 Unspecified acute conjunctivitis, bilateral: Secondary | ICD-10-CM | POA: Diagnosis not present

## 2020-08-16 DIAGNOSIS — H02844 Edema of left upper eyelid: Secondary | ICD-10-CM | POA: Diagnosis present

## 2020-08-16 DIAGNOSIS — Z8572 Personal history of non-Hodgkin lymphomas: Secondary | ICD-10-CM | POA: Insufficient documentation

## 2020-08-16 DIAGNOSIS — Z1231 Encounter for screening mammogram for malignant neoplasm of breast: Secondary | ICD-10-CM | POA: Diagnosis not present

## 2020-08-16 DIAGNOSIS — Z79899 Other long term (current) drug therapy: Secondary | ICD-10-CM | POA: Insufficient documentation

## 2020-08-16 DIAGNOSIS — T7849XA Other allergy, initial encounter: Secondary | ICD-10-CM | POA: Insufficient documentation

## 2020-08-16 LAB — HEMOCCULT GUIAC POC 1CARD (OFFICE): Fecal Occult Blood, POC: NEGATIVE

## 2020-08-16 MED ORDER — ERYTHROMYCIN 5 MG/GM OP OINT
TOPICAL_OINTMENT | Freq: Once | OPHTHALMIC | Status: AC
  Administered 2020-08-16: 1 via OPHTHALMIC
  Filled 2020-08-16: qty 3.5

## 2020-08-16 MED ORDER — ERYTHROMYCIN 5 MG/GM OP OINT: TOPICAL_OINTMENT | Freq: Four times a day (QID) | OPHTHALMIC | 0 refills | Status: AC

## 2020-08-16 NOTE — ED Provider Notes (Signed)
[facility] Provider Note   CSN: [id number] Arrival date & time: 08/16/20  1121     History No chief complaint on file.   Amber Drake is a 60 y.o. female who presents with concern for goopy drainage from eyes bilaterally x3 days with associated mild swelling of the upper eyelids.  Patient states that 3 days ago she had artificial eyelashes applied, and woke up the next morning with her eyes crusted shut and thick yellow drainage from the corners of her eyes.  Throughout the day range converts to primarily watery and she has very mild itching/irritation of the eyes but no pain in the eyes.  She denies any fevers or chills, congestion, sore throat, cough.  Patient denies any sexual activity in the last 2 years.  I personally reviewed this patient's medical records.  She has history of sickle cell trait, hypertension, in remission she is on medications for cholesterol and blood pressure daily.   HPI     Past Medical History:  Diagnosis Date  . Anemia   . Arthritis   . Blood dyscrasia    sickle cell trait  . Cancer ([place])    lymphoma  . Carbuncle and furuncle   . Hypercholesterolemia   . Hypertension    does not take meds  . Sickle cell trait [facility])     Patient Active Problem List   Diagnosis Date Noted  . Hemorrhoids 08/16/2020  . Encounter for screening fecal occult blood testing 08/16/2020  . Screening mammogram for breast cancer 08/16/2020  . Encounter for gynecological examination with Papanicolaou smear of cervix 08/16/2020  . Screening for colorectal cancer 08/16/2020  . Allergic reaction to adhesive 08/16/2020  . 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 Counseling regarding advance care planning and goals of care 10/23/2018  . 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 Brain tumor ([place]) 07/28/2018  . ANA positive 03/24/2018  . Carpal tunnel syndrome on both sides 03/24/2018  . Chronic pain of both shoulders  03/24/2018  . Hidradenitis 07/31/2016  . Recurrent boils 07/31/2016  . Hyperlipidemia 06/15/2015  . Pain of left hand 05/19/2015  . Hand pain, right 05/19/2015  . Hematuria 03/03/2015  . Esophageal reflux 03/03/2015  . Essential hypertension, benign 03/03/2015  . Bronchitis due to tobacco use 10/17/2010    Past Surgical History:  Procedure Laterality Date  . APPENDECTOMY    . APPLICATION OF CRANIAL NAVIGATION N/A 07/28/2018   Procedure: APPLICATION OF CRANIAL NAVIGATION;  Surgeon: Kary Kos, MD;  Location: [facility];  Service: Neurosurgery;  Laterality: N/A;  . BRAIN SURGERY    . CARPAL TUNNEL RELEASE  03/23/2011   Procedure: CARPAL TUNNEL RELEASE;  Surgeon: Sanjuana Kava;  [facility];  Service: Orthopedics;  Laterality: Left;  . CARPAL TUNNEL RELEASE  05/10/2011   Procedure: CARPAL TUNNEL RELEASE;  Surgeon: Sanjuana Kava, MD;  [facility];  Service: Orthopedics;  Laterality: Right;  . CESAREAN SECTION     x 2  . INCISION AND DRAINAGE PERIRECTAL ABSCESS  12/21/09  . PR DURAL GRAFT REPAIR,SPINE DEFECT N/A 07/28/2018   Procedure: Stereotactic open biopsy of Right cerebellar hemisphere and dura with brainlab;  Surgeon: Kary Kos, MD;  Location: [facility];  Service: Neurosurgery;  Laterality: N/A;  Stereotactic open biopsy of Right cerebellar hemisphere and dura with brainlab  . PROCTOSCOPY  10/17/2010   Procedure: PROCTOSCOPY;  Surgeon: Scherry Ran;  [facility];  Service: General;  Laterality: N/A;  Rigid Proctoscopy/Possible  Fistula in Ano  Procedure ended at 1003  . THERAPEUTIC ABORTION     x2     OB History    Gravida  4   Para  2   Term  2   Preterm      AB  2   Living  2     SAB      IAB      Ectopic      Multiple      Live Births  2           Family History  Problem Relation Age of Onset  . Kidney failure Daughter   . Sickle cell anemia Daughter   . Sickle cell trait Daughter   . Sickle cell anemia Son   . Hypertension Mother   .  Hyperlipidemia Mother   . Hypertension Father   . Lupus Father        skin  . Hypertension Other   . Sarcoidosis Sister   . Anesthesia problems Neg Hx   . Hypotension Neg Hx   . Malignant hyperthermia Neg Hx   . Pseudochol deficiency Neg Hx     Social History   Tobacco Use  . Smoking status: Current Every Day Smoker    Packs/day: 0.50    Years: 30.00    Pack years: 15.00    Types: Cigarettes  . Smokeless tobacco: Never Used  Vaping Use  . Vaping Use: Never used  Substance Use Topics  . Alcohol use: No  . 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 Authorizing Provider  erythromycin ophthalmic ointment Place into both eyes 4 (four) times daily for 7 days. Place a 1/2 inch ribbon of ointment into the lower eyelid. 08/16/20 08/23/20 Yes Mildreth Reek, Eugene Garnet R, Amber-C  atorvastatin (LIPITOR) 20 MG tablet Take 1 tablet (20 mg total) by mouth daily. 03/14/15   Soyla Dryer, Amber-C  cyclobenzaprine (FLEXERIL) 10 MG tablet Take 1 tablet (10 mg total) by mouth at bedtime. One tablet every night at bedtime as needed for spasm. 05/31/20   Sanjuana Kava, MD  ferrous sulfate 325 (65 FE) MG tablet Take 325 mg by mouth daily with breakfast. Patient not taking: Reported on 08/16/2020    [provider]  HYDROcodone-acetaminophen (NORCO) 7.5-325 MG tablet One tablet every six hours as needed for pain. 08/02/20   Sanjuana Kava, MD  ibuprofen (ADVIL) 800 MG tablet Take 1 tablet (800 mg total) by mouth every 8 (eight) hours as needed. 10/13/19   Milton Ferguson, MD  lisinopril-hydrochlorothiazide (PRINZIDE,ZESTORETIC) 20-12.5 MG tablet Take 1 tablet by mouth daily.    [provider]  Multiple Vitamin (MULTIVITAMIN WITH MINERALS) TABS tablet Take 1 tablet by mouth daily.    [provider]  naproxen (NAPROSYN) 500 MG tablet Take 1 tablet (500 mg total) by mouth 2 (two) times  daily with a meal. Patient not taking: Reported on 08/16/2020 05/31/20   Sanjuana Kava, MD  potassium chloride SA (KLOR-CON) 20 MEQ tablet TAKE (1) TABLET BY MOUTH TWICE DAILY. 07/25/20   Brunetta Genera, MD  ranitidine (ZANTAC) 150 MG capsule Take 150 mg by mouth 2 (two) times daily. Patient not taking: Reported on 08/16/2020    [provider]    Allergies    Penicillins, Penicillins cross reactors, and Tape  Review of Systems   Review of Systems  Constitutional: Negative.  HENT: Negative.   Eyes: Positive for discharge, redness and itching. Negative for photophobia, pain and visual disturbance.  Respiratory: Negative.   Cardiovascular: Negative.   Gastrointestinal: Negative.   Musculoskeletal: Negative.   Skin: Negative.   Hematological: Negative.     Physical Exam Updated Vital Signs BP (!) 160/77 (BP Location: Right Arm)   Pulse 77   Temp 98.1 F (36.7 C) (Oral)   Resp 18   LMP 08/12/2010   SpO2 100%   Physical Exam Vitals and nursing note reviewed.  Constitutional:      Appearance: She is not toxic-appearing.  HENT:     Head: Normocephalic and atraumatic.     Nose: Nose normal.     Mouth/Throat:     Mouth: Mucous membranes are moist.     Pharynx: Oropharynx is clear. Uvula midline. No oropharyngeal exudate or posterior oropharyngeal erythema.     Tonsils: No tonsillar exudate.  Eyes:     General: Lids are everted, no foreign bodies appreciated. Vision grossly intact. Gaze aligned appropriately. No scleral icterus.       Right eye: Discharge present.        Left eye: Discharge present.    Extraocular Movements: Extraocular movements intact.     Conjunctiva/sclera:     Right eye: Right conjunctiva is injected.     Left eye: Left conjunctiva is injected.     Pupils: Pupils are equal, round, and reactive to light.     Comments: Injected conjunctiva bilaterally, yellow crusted discharge in the corners of the eyes and in false eyelashes.  No pain with  EOMs.  Mild edema of the upper eyelids without TTP, surrounding induration, or crepitus  Neck:     Trachea: Trachea and phonation normal.  Cardiovascular:     Rate and Rhythm: Normal rate.  Pulmonary:     Effort: Pulmonary effort is normal.  Musculoskeletal:     Cervical back: Normal range of motion. No edema, rigidity or crepitus. No pain with movement.  Lymphadenopathy:     Cervical: No cervical adenopathy.  Skin:    General: Skin is warm and dry.  Neurological:     General: No focal deficit present.     Mental Status: She is alert and oriented to person, place, and time.     Gait: Gait is intact.  Psychiatric:        Mood and Affect: Mood normal.     ED Results / Procedures / Treatments   Labs (all labs ordered are listed, but only abnormal results are displayed) Labs Reviewed - No data to display  EKG None  Radiology  Procedures Procedures   Medications Ordered in ED Medications  erythromycin ophthalmic ointment (has no administration in time range)    ED Course  I have reviewed the triage vital signs and the nursing notes.  Pertinent labs & imaging results that were available during my care of the patient were reviewed by me and considered in my medical decision making (see chart for details).    MDM Rules/Calculators/A&P                         60 year old female presents with bilateral eye irritation and drainage x72 hours.  Differential diagnosis includes but is limited to viral conjunctivitis, bacterial conjunctivitis, gonococcal conjunctivitis, allergic conjunctivitis, caustic gingivitis, herpes zoster ophthalmicus, keratoconjunctivitis, preseptal cellulitis, orbital cellulitis.  Physical exam concerning for injected conjunctive a bilaterally with thick yellow discharge in the corners of the eyes  as well as watery discharge.  No pain with EOMs or surrounding erythema or crepitus.  No signs of entrapment, EOMs are intact, PERRL.  No skin changes to  suggest herpes zoster.  Physical exam and HPI most consistent with bacterial conjunctivitis of both eyes.  First dose of erythromycin ointment administered in the ED, patient prescribed 7 days of erythromycin ointment 4 times daily at home.  No further work-up warranted in ED at this time.  Recommend follow-up with PCP or eye doctor in 1 week.  Tiffanny voiced understanding of her medical evaluation and treatment plan.  Each of her questions was answered to her expressed satisfaction.  Return precautions given.  Patient is well-appearing, stable, and appropriate for discharge at this time.  This chart was dictated using voice recognition software, Dragon. Despite the best efforts of this provider to proofread and correct errors, errors may still occur which can change documentation meaning.  Final Clinical Impression(s) / ED Diagnoses Final diagnoses:  Bacterial conjunctivitis of both eyes    Rx / DC Orders ED Discharge Orders         Ordered    erythromycin ophthalmic ointment  4 times daily        08/16/20 1416           Cheyeanne Roadcap, Gypsy Balsam, Amber-C 08/16/20 1442    [name], MD 08/16/20 [name]

## 2020-08-16 NOTE — ED Provider Notes (Signed)
Emergency Medicine Provider Triage Evaluation Note  Amber Drake , a 60 y.o. female  was evaluated in triage.  Pt complains of who presents to the emergency department due to eye drainage.  Patient states she had synthetic eyebrows that were placed about 3 days ago.  She states that he used glue to place them.  Since having them placed she has been having yellow discharge from the eyes.  She reports associated eye redness but no eye irritation or visual changes.  She reports small amount of irritation and swelling to the tissues in the periorbital region.  No other complaints.  Physical Exam  BP (!) 160/77 (BP Location: Right Arm)   Pulse 77   Temp 98.1 F (36.7 C) (Oral)   Resp 18   LMP 08/12/2010   SpO2 100%  Gen:   Awake, no distress   Resp:  Normal effort  MSK:   Moves extremities without difficulty  Other:    Medical Decision Making  Medically screening exam initiated at 12:58 PM.  Appropriate orders placed.  Amber Drake was informed that the remainder of the evaluation will be completed by another provider, this initial triage assessment does not replace that evaluation, and the importance of remaining in the ED until their evaluation is complete.     Amber Sexton, PA-C 08/16/20 1259    Amber Rasmussen, MD 08/16/20 Amber Drake

## 2020-08-16 NOTE — ED Triage Notes (Signed)
Left eye swelling from false eyelashes

## 2020-08-16 NOTE — Discharge Instructions (Addendum)
You were seen in the ER today for the drainage from your eyes.  You likely have a bacterial infection of both eyes.  For this reason you have been administered your first dose of antibiotic ointment in the emergency department.  Additionally you have been prescribed the same antibiotic ointment to use 4 times a day for the next 7 days.  The tube you are provided in the emergency department will not be enough for the entire week's course, so please pick up the rest of your prescription at the pharmacy as listed below.  Return to the emergency department if you develop any pain in your eyes, blurry vision or double vision unrelated to application of your antibiotic ointment, or any other new severe symptoms.  Please follow-up with your primary care doctor or eye doctor next week.

## 2020-08-16 NOTE — Progress Notes (Signed)
Patient ID: Amber Drake, female   DOB: 1960-08-10, 60 y.o.   MRN: [id number] History of Present Illness: Amber Drake is a 60 year old black female, married, PM in for a well woman gyn exam and pap. Lab Results  Component Value Date   DIAGPAP  01/20/2019    - Negative for intraepithelial lesion or malignancy (NILM)   HPVHIGH Positive (A) 01/20/2019  PCP is Dr Legrand Rams   Current Medications, Allergies, Past Medical History, Past Surgical History, Family History and Social History were reviewed in [facility] record.     Review of Systems: Patient denies any headaches, hearing loss, fatigue, blurred vision, shortness of breath, chest pain, abdominal pain, problems with bowel movements, urination, or intercourse. No joint pain or mood swings. No vaginal bleeding, has rectal bleeding at times.    Physical Exam:BP 118/72 (BP Location: Left Arm, Patient Position: Sitting, Cuff Size: Normal)   Pulse 82   Ht 5\' 7"  (1.702 m)   Wt 166 lb (75.3 kg)   LMP 08/12/2010   BMI 26.00 kg/m  General:  Well developed, well nourished, no acute distress Skin:  Warm and dry,eyes swollen and draining, got new eye lashes placed  Neck:  Midline trachea, normal thyroid, good ROM, no lymphadenopathy Lungs; Clear to auscultation bilaterally Breast:  No dominant palpable mass, retraction, or nipple discharge Cardiovascular: Regular rate and rhythm Abdomen:  Soft, non tender, no hepatosplenomegaly Pelvic:  External genitalia is normal in appearance, has old scarring from boils  The vagina is normal in appearance. Urethra has no lesions or masses. The cervix is bulbous,and smooth, pap with HR HPV genotyping performed .  Uterus is felt to be normal size, shape, and contour.  No adnexal masses or tenderness noted.Bladder is non tender, no masses felt. Rectal: Good sphincter tone, no polyps, + hemorrhoids felt.  Hemoccult negative. Extremities/musculoskeletal:  No swelling or varicosities noted, no  clubbing or cyanosis Psych:  No mood changes, alert and cooperative,seems happy AA is 0  Fall risk is low PHQ 9 score is 0 GAD 7 score is 0  Upstream - 08/16/20 1030      Pregnancy Intention Screening   Does the patient want to become pregnant in the next year? No    Does the patient's partner want to become pregnant in the next year? No    Would the patient like to discuss contraceptive options today? No      Contraception Wrap Up   Current Method No Contraceptive Precautions   PM   End Method No Contraception Precautions   PM   Contraception Counseling Provided No         Examination chaperoned by Estill Bamberg LPN  Impression and Plan: 1. Encounter for gynecological examination with Papanicolaou smear of cervix Pap sent Physical in 1 year Pap in 3 if normal Labs with oncology  She declines to stop smoking for now  2. Screening mammogram for breast cancer Scheduled appt for her for 08/19/20 at 8:15 am at [facility]   3. Encounter for screening fecal occult blood testing  4. Hemorrhoids, unspecified hemorrhoid type  5. Screening for colorectal cancer Referred to [facility] for colonoscopy   6. Allergic reaction to adhesive See eye doctor, can take benadryl, may need to remove lashes She said she may go to ER

## 2020-08-16 NOTE — ED Triage Notes (Signed)
Patient is on the phone during triage process

## 2020-08-17 NOTE — Progress Notes (Signed)
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 female who has been referred to [place] by my colleague Dr. Cecil Drake in Neuro-Oncology for evaluation and management of her Newly diagnosed Extranodal Marginal Zone Lymphoma. She is accompanied today by her sister Amber Drake via [employer]. The pt reports that she is doing well overall.  Prior to today's visit, the pt presented to the ED on 07/21/18 with complaints of a headache for the past month which presented intermittently and was worsened by bending, coughing, or laughing. She had a CT Head and MRI Brain which revealed extensive dural thickening, as noted below. She then had a right posterior fossa craniectomy on 07/28/18 for open exicisional biopsies of the right cerebellar hemisphere and dura which revealed Extranodal marginal zone lymphoma. The pt then established care with my colleague Dr. Cecil Drake in Neuro-Onc on 08/11/18. She has been taking 4mg  Decadron daily.  The pt reports that developed worsened headaches about a month ago, and notes that when she laughed it felt like "electiricty" was running through the sides and back of her head. She notes that some mild headaches had begun a couple months ago. Denies double vision, changes in vision, loss of balance, or other senses being affected. The pt notes that in addition to this, she developed redness in the eyes in the past couple months, saw eye doctor and was prescribed eye drops. Denies other concerns in the last 6 months including bone pains, fatigue, fevers, chills, night sweats or unexpected weight loss. She notes that her headaches have resolved. She notes she began Decadron after her biopsy. She denies having "any symptoms  whatsoever," currently.   Of note prior to the patient's visit today, pt has had an MRI Brain completed on 07/21/18 with results revealing "Extensive dural thickening surrounding the right cerebellar hemisphere with mass-effect and edema in the right cerebellum. The process appears to extend into the upper cervical canal. Mild obstructive hydrocephalus. Differential includes tumor including metastatic disease and lymphoma. Chronic inflammatory process such as sarcoid is a consideration however chest x-ray negative. TB and atypical infection/fungus also possible. 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  On review of systems, pt reports good energy levels, headache resolution, and denies joint issues, skin rashes, dry mouth, dry eyes, fevers, chills, night sweats, bone pains, unexpected weight loss, fatigue, double vision, loss of vision, affected senses, loss of balance, and any other symptoms.   On PMHx the pt reports sickle cel trait, HTN, hyperlipidemia. On Social Hx the pt reports that she smoked 1 ppd for the last 42 years, and reports having quit two days ago. 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 On Family Hx the pt reports dad with lupus and sister with sarcoidosis. Daughter and son with sickle cell disease. Denies other blood disorders or cancers. Endorses Penicillin allergy  Interval History:   I connected with Amber Drake on 08/18/2020 by telephone and verified that I am speaking  with the correct person using two identifiers.   I discussed the limitations of evaluation and management by telemedicine. The patient expressed understanding and agreed to proceed.   Other persons participating in the visit and their  role in the encounter:                                                         - Amber Drake, Medical Scribe     Patient's location: Home Provider's location: [facility] [place] returns today for management and evaluation of her CNS Extranodal Marginal Zone Lymphoma. The patient's last visit with [place] was on 07/26/2020. The pt reports that she is doing well overall.   The pt reports that she recently used some artificial eyelash extensions and got a bacterial infection. She is currently using erythromycin ointment daily. The pt notes no other issues or concerns. The pt notes that she has pinched nerves in her back and will either need steroid injections or surgery.  Of note since the patient's last visit, pt has had MR Brain ([id number]) on 08/14/2020, which revealed "1. Unchanged appearance of the brain. No evidence of recurrent intracranial lymphoma. 2. 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  MEDICAL HISTORY:  Past Medical History:  Diagnosis Date  . Anemia   . Arthritis   . Blood dyscrasia    sickle cell trait  . Cancer ([place])    lymphoma  . Carbuncle and furuncle   . Hypercholesterolemia   . Hypertension    does not take meds  . Sickle cell trait ([place])     SURGICAL HISTORY: Past Surgical History:  Procedure Laterality Date  . APPENDECTOMY    . APPLICATION OF CRANIAL NAVIGATION N/A 07/28/2018   Procedure: APPLICATION OF CRANIAL NAVIGATION;  Surgeon: Kary Kos, MD;  Location: [facility];  Service: Neurosurgery;  Laterality: N/A;  . BRAIN SURGERY    . CARPAL TUNNEL RELEASE  03/23/2011   Procedure: CARPAL TUNNEL RELEASE;  Surgeon: Sanjuana Kava;  Location: [facility];  Service: Orthopedics;  Laterality: Left;  . CARPAL TUNNEL RELEASE  05/10/2011   Procedure: CARPAL TUNNEL RELEASE;  Surgeon: Sanjuana Kava, MD;  Location:  [facility];  Service: Orthopedics;  Laterality: Right;  . CESAREAN SECTION     x 2  . INCISION AND DRAINAGE PERIRECTAL ABSCESS  12/21/09  . PR DURAL GRAFT REPAIR,SPINE DEFECT N/A 07/28/2018   Procedure: Stereotactic open biopsy of Right cerebellar hemisphere and dura with brainlab;  Surgeon: Kary Kos, MD;  Location: [facility];  Service: Neurosurgery;  Laterality: N/A;  Stereotactic open biopsy of Right cerebellar hemisphere and dura with brainlab  . PROCTOSCOPY  10/17/2010   Procedure: PROCTOSCOPY;  Surgeon: Scherry Ran;  Location: [facility];  Service: General;  Laterality: N/A;  Rigid Proctoscopy/Possible Fistula in Ano  Procedure ended at 1003  . THERAPEUTIC ABORTION     x2    SOCIAL HISTORY: Social History   Socioeconomic History  . Marital status: Married    Spouse name: Not on file  . Number of children: Not on file  . Years of education: Not on file  . Highest education level: Not on file  Occupational History  .  Not on file  Tobacco Use  . Smoking status: Current Every Day Smoker    Packs/day: 0.50    Years: 30.00    Pack years: 15.00    Types: Cigarettes  . Smokeless tobacco: Never Used  Vaping Use  . Vaping Use: Never used  Substance and Sexual Activity  . Alcohol use: No  . 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 Sexual activity: Yes    Partners: Male    Birth control/protection: None, Post-menopausal  Other Topics Concern  . Not on file  Social History Narrative  . Not on file   Social Determinants of Health   Financial Resource Strain: Low Risk   . Difficulty of Paying Living Expenses: Not hard at all  Food Insecurity: No Food Insecurity  . Worried About Charity fundraiser in the Last Year: Never true  . Ran Out of Food in the Last Year: Never true  Transportation Needs: No Transportation Needs  . Lack of Transportation (Medical): No  . Lack of Transportation (Non-Medical): No  Physical Activity: Insufficiently Active  . Days of Exercise per  Week: 3 days  . Minutes of Exercise per Session: 20 min  Stress: No Stress Concern Present  . Feeling of Stress : Not at all  Social Connections: Socially Integrated  . Frequency of Communication with Friends and Family: More than three times a week  . Frequency of Social Gatherings with Friends and Family: Twice a week  . Attends Religious Services: 1 to 4 times per year  . Active Member of Clubs or Organizations: Yes  . Attends Archivist Meetings: Never  . Marital Status: Married  Human resources officer Violence: Not At Risk  . Fear of Current or Ex-Partner: No  . Emotionally Abused: No  . Physically Abused: No  . Sexually Abused: No    FAMILY HISTORY: Family History  Problem Relation Age of Onset  . Kidney failure Daughter   . Sickle cell anemia Daughter   . Sickle cell trait Daughter   . Sickle cell anemia Son   . Hypertension Mother   . Hyperlipidemia Mother   . Hypertension Father   . Lupus Father        skin  . Hypertension Other   . Sarcoidosis Sister   . Anesthesia problems Neg Hx   . Hypotension Neg Hx   . Malignant hyperthermia Neg Hx   . Pseudochol deficiency Neg Hx     ALLERGIES:  is allergic to penicillins, penicillins cross reactors, and tape.  MEDICATIONS:  Current Outpatient Medications  Medication Sig Dispense Refill  . atorvastatin (LIPITOR) 20 MG tablet Take 1 tablet (20 mg total) by mouth daily. 90 tablet 2  . cyclobenzaprine (FLEXERIL) 10 MG tablet Take 1 tablet (10 mg total) by mouth at bedtime. One tablet every night at bedtime as needed for spasm. 30 tablet 0  . erythromycin ophthalmic ointment Place into both eyes 4 (four) times daily for 7 days. Place a 1/2 inch ribbon of ointment into the lower eyelid. 3.5 g 0  . ferrous sulfate 325 (65 FE) MG tablet Take 325 mg by mouth daily with breakfast. (Patient not taking: Reported on 08/16/2020)    . HYDROcodone-acetaminophen (NORCO) 7.5-325 MG tablet One tablet every six hours as needed for pain.  40 tablet 0  . ibuprofen (ADVIL) 800 MG tablet Take 1 tablet (800 mg total) by mouth every 8 (eight) hours as needed. 20 tablet 1  .  lisinopril-hydrochlorothiazide (PRINZIDE,ZESTORETIC) 20-12.5 MG tablet Take 1 tablet by mouth daily.    . Multiple Vitamin (MULTIVITAMIN WITH MINERALS) TABS tablet Take 1 tablet by mouth daily.    . naproxen (NAPROSYN) 500 MG tablet Take 1 tablet (500 mg total) by mouth 2 (two) times daily with a meal. (Patient not taking: Reported on 08/16/2020) 60 tablet 5  . potassium chloride SA (KLOR-CON) 20 MEQ tablet TAKE (1) TABLET BY MOUTH TWICE DAILY. 60 tablet 1  . ranitidine (ZANTAC) 150 MG capsule Take 150 mg by mouth 2 (two) times daily. (Patient not taking: Reported on 08/16/2020)     No current facility-administered medications for this visit.   Facility-Administered Medications Ordered in Other Visits  Medication Dose Route Frequency Provider Last Rate Last Admin  . acetaminophen (TYLENOL) tablet 500 mg  500 mg Oral Once Brunetta Genera, MD       And  . oxyCODONE (Oxy IR/ROXICODONE) immediate release tablet 5 mg  5 mg Oral Once Brunetta Genera, MD      . dexamethasone (DECADRON) 10 mg in sodium chloride 0.9 % 50 mL IVPB  10 mg Intravenous Once Brunetta Genera, MD        REVIEW OF SYSTEMS:   10 Point review of Systems was done is negative except as noted above.  PHYSICAL EXAMINATION: ECOG PERFORMANCE STATUS: 1 - Symptomatic but completely ambulatory  . There were no vitals filed for this visit. There were no vitals filed for this visit. .There is no height or weight on file to calculate BMI.   Telehealth Visit  LABORATORY DATA:  I have reviewed the data as listed  . CBC Latest Ref Rng & Units 07/25/2020 05/24/2020 03/23/2020  WBC 4.0 - 10.5 K/uL 4.8 5.0 6.5  Hemoglobin 12.0 - 15.0 g/dL 11.8(L) 12.1 11.5(L)  Hematocrit 36.0 - 46.0 % 34.7(L) 35.9(L) 34.5(L)  Platelets 150 - 400 K/uL 387 386 395    . CMP Latest Ref Rng & Units 07/25/2020  05/24/2020 03/23/2020  Glucose 70 - 99 mg/dL 103(H) 92 98  BUN 6 - 20 mg/dL 16 12 10   Creatinine 0.44 - 1.00 mg/dL 0.78 0.76 0.75  Sodium 135 - 145 mmol/L 141 142 142  Potassium 3.5 - 5.1 mmol/L 3.0(L) 3.4(L) 3.2(L)  Chloride 98 - 111 mmol/L 104 106 106  CO2 22 - 32 mmol/L 27 26 27   Calcium 8.9 - 10.3 mg/dL 9.9 9.9 10.0  Total Protein 6.5 - 8.1 g/dL 7.1 7.0 7.1  Total Bilirubin 0.3 - 1.2 mg/dL 0.3 0.3 0.2(L)  Alkaline Phos 38 - 126 U/L 73 73 77  AST 15 - 41 U/L 20 13(L) 19  ALT 0 - 44 U/L 13 12 15    . Lab Results  Component Value Date   LDH 190 07/25/2020     09/12/18 BM Biopsy:    07/28/18 Right cerebellar hemisphere and dura biopsy:     RADIOGRAPHIC STUDIES: I have personally reviewed the radiological images as listed and agreed with the findings in the report. MR BRAIN W WO CONTRAST  Result Date: 08/15/2020 CLINICAL DATA:  Marginal zone lymphoma.  Assess treatment response. EXAM: MRI HEAD WITHOUT AND WITH CONTRAST TECHNIQUE: Multiplanar, multiecho pulse sequences of the brain and surrounding structures were obtained without and with intravenous contrast. CONTRAST:  65mL MULTIHANCE GADOBENATE DIMEGLUMINE 529 MG/ML IV SOLN COMPARISON:  12/15/2019 FINDINGS: Brain: Postoperative changes are again seen from a right suboccipital craniotomy without a recurrent mass or suspicious dural thickening in the posterior fossa. A 6 mm  third ventricular colloid cyst is unchanged without hydrocephalus. No acute infarct, intracranial hemorrhage, midline shift, or extra-axial fluid collection is identified. Minimal scattered punctate foci of T2 FLAIR hyperintensity in the cerebral white matter are similar to the prior study and nonspecific. Vascular: Major intracranial vascular flow voids are preserved. Skull and upper cervical spine: No suspicious marrow lesion. Sinuses/Orbits: Unremarkable orbits. Paranasal sinuses and mastoid air cells are clear. Other: None. IMPRESSION: 1. Unchanged appearance of  the brain. No evidence of recurrent intracranial lymphoma. 2. 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 Electronically Signed   By: Logan Bores M.D.   On: 08/15/2020 16:14   MR Lumbar Spine Wo Contrast  Result Date: 07/27/2020 CLINICAL DATA:  Low back pain. EXAM: MRI LUMBAR SPINE WITHOUT CONTRAST TECHNIQUE: Multiplanar, multisequence MR imaging of the lumbar spine was performed. No intravenous contrast was administered. COMPARISON:  None. FINDINGS: Segmentation: A transitional lumbosacral vertebra is assumed to represent the S1 level. Careful correlation with this numbering strategy prior to any procedural intervention would be recommended. Alignment:  Physiologic. Vertebrae: No fracture, evidence of discitis, or bone lesion. Marrow and soft tissue edema about the facet joints at L4-5 and on the left at L5-S1. Conus medullaris and cauda equina: Conus extends to the L1-2 level. Conus and cauda equina appear normal. Paraspinal and other soft tissues: Bilateral renal cysts. Disc levels: L1-2: No spinal canal or neural foraminal stenosis. L2-3: Mild facet degenerative changes. No spinal canal or neural foraminal stenosis. L3-4: Shallow disc bulge and mild facet degenerative changes without significant spinal canal or neural foraminal stenosis. L4-5: Disc bulge, hypertrophic facet degenerative changes and ligamentum flavum redundancy resulting in mild spinal canal stenosis and mild bilateral neural foraminal narrowing. L5-S1: Disc bulge with superimposed left foraminal disc protrusion, hypertrophic facet degenerative changes with bilateral joint effusion and ligamentum flavum redundancy resulting in mild spinal canal stenosis with narrowing of the bilateral subarticular zones, and moderate to severe left neural foraminal impinging on the X left L5 nerve root. S1-2: No spinal canal or neural foraminal stenosis. IMPRESSION: 1. 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 Careful  correlation with this numbering strategy prior to any procedural intervention would be recommended. 2. Mild spinal canal stenosis at L4-5 and L5-S1 with narrowing of the bilateral subarticular zones at L5-S1 and moderate to severe left neural foraminal narrowing at L5-S1 impinging on the exiting left L5 nerve root. 3. Lower lumbar facet arthritis. Electronically Signed   By: Pedro Earls M.D.   On: 07/27/2020 11:30    ASSESSMENT & PLAN:  60 y.o. female with  1. 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 The process appears to extend into the upper cervical canal. Mild obstructive hydrocephalus. Differential includes tumor including metastatic disease and lymphoma. Chronic inflammatory process such as sarcoid is a consideration however chest x-ray negative. TB and atypical infection/fungus also possible. 6 mm colloid cyst felt to be a separate problem."  07/25/18 CT C/A/P which did not reveal significant abnormality  09/12/18 BM Bx revealed no evidence of lymphoma  09/08/18 PET/CT revealed "No hypermetabolic mass or adenopathy identified within the chest abdomen or pelvis  to suggest metabolically active tumor. 2. Nonspecific focus of increased uptake within the cord extending from T12 to L1. Cannot rule out additional site of CNS lymphoma. Consider further evaluation with contrast enhanced MRI through this area. 3. Aortic Atherosclerosis and Emphysema."  10/16/2018 MRI brain w and w/o contrast revealed "1. Resolved dural mass in the right posterior  fossa with minimal smooth dural thickening that may be treatment related. Cerebellar edema and mass effect is also resolved. No new site of disease. 2. 6 mm colloid cyst."  10/20/2018 MRI cervical spine w and w/o contrast revealed "Negative for lymphoma. No acute abnormality. Central disc protrusion at C5-6 effaces the ventral thecal sac causing mild central canal narrowing. There is also a shallow disc bulge at C6-7 which narrows but does not efface the ventral thecal Sac."  01/13/2019 MRI Brain ([id number]) revealed "1. Stable and satisfactory post treatment appearance of the posterior fossa. 2. Stable small 6 mm colloid cyst. 3. No new intracranial abnormality."  05/06/2019 MRI Cervical Spine ([id number]) revealed "No evidence of lymphoma in the cervical spine. Previously noted dural enhancement in the posterior fossa and cervical canal has resolved. No cord compression or cord lesion. Chronic cervical spine degenerative changes are stable from the prior study."  12/15/2019 MRI Brain ([id number]) revealed "No acute intracranial abnormality. No enhancing mass lesion 5 mm colloid cyst in the third ventricle is chronic and unchanged from prior studies."  PLAN: -Discussed pt MR Brain ([id number]) on 08/14/2020; no signs of lymphoma recurrence.  -Continue to f/u w back doctor regarding arthritis and pinched nerves causing pain. -Advised pt that there is no lab or clinical evidence of CNS Extranodal Marginal Zone Lymphoma recurrence at this time. -Will continue maintenance Rituxan q8weeks for up to three years. The pt has no prohibitive toxicities. -Continue to avoid Ibuprofen, use OTC Tylenol for pain management.  -Advised pt we continue to monitor labs and symptoms for progression of disease.  -Will see back as scheduled on 07/01.    FOLLOW UP: Follow-up as scheduled for next cycle of Rituxan labs and MD visit on 09/30/2020   The total time spent in the appointment was 15 minutes and more  than 50% was on counseling and direct patient cares.   All of the patient's questions were answered with apparent satisfaction. The patient knows to call the clinic with any problems, questions or concerns.    Sullivan Lone MD [place] AAHIVMS [facility] Hematology/Oncology Physician [facility]  (Office):       [phone] (Work cell):  [phone] (Fax):           [phone]  08/18/2020 9:28 AM  I, Amber Drake, am acting as scribe for Dr. Sullivan Lone, MD.   .I have reviewed the above documentation for accuracy and completeness, and I agree with the above. Brunetta Genera MD

## 2020-08-18 ENCOUNTER — Telehealth: Payer: Self-pay | Admitting: Orthopaedic Surgery

## 2020-08-18 ENCOUNTER — Inpatient Hospital Stay: Payer: Commercial Managed Care - PPO | Attending: Hematology | Admitting: Hematology

## 2020-08-18 DIAGNOSIS — I10 Essential (primary) hypertension: Secondary | ICD-10-CM | POA: Diagnosis not present

## 2020-08-18 DIAGNOSIS — C884 Extranodal marginal zone B-cell lymphoma of mucosa-associated lymphoid tissue [MALT-lymphoma]: Secondary | ICD-10-CM | POA: Diagnosis not present

## 2020-08-18 DIAGNOSIS — Z79899 Other long term (current) drug therapy: Secondary | ICD-10-CM | POA: Insufficient documentation

## 2020-08-18 DIAGNOSIS — D573 Sickle-cell trait: Secondary | ICD-10-CM | POA: Insufficient documentation

## 2020-08-18 DIAGNOSIS — E785 Hyperlipidemia, unspecified: Secondary | ICD-10-CM | POA: Diagnosis not present

## 2020-08-18 NOTE — Telephone Encounter (Signed)
-----   Message from Bo Mcclintock, [place] sent at 08/17/2020  4:51 PM EDT ----- Regarding: Call  from Patient I called and left [place] a message to call me. If she does please give her CNS phone number [phone] and tell her they have been trying to reach her to get her appointment scheduled.  Thanks

## 2020-08-18 NOTE — Telephone Encounter (Addendum)
Patient returned call; given [facility] phone # as noted; I re-verified pt's phone#, which she said best # is [phone], which we have now bolded as main# - states other phone may be turned off soon. Also said she works 3rd shift and is asleep during much of the day but will call them today to set up appointment per the referral.  Note: patient also asked if Dr Luna Glasgow may be able to order her a stronger pain medication. Advised patient that she is not due for any type of pain medication until her current prescription runs out. In addition, the referral appointment to the neurosurgeon is to be scheduled, as discussed.

## 2020-08-19 ENCOUNTER — Ambulatory Visit (HOSPITAL_COMMUNITY)
Admission: RE | Admit: 2020-08-19 | Discharge: 2020-08-19 | Disposition: A | Discharge status: Home / Self Care | Payer: Commercial Managed Care - PPO | Source: Ambulatory Visit | Attending: Adult Health | Admitting: Adult Health

## 2020-08-19 DIAGNOSIS — Z1231 Encounter for screening mammogram for malignant neoplasm of breast: Secondary | ICD-10-CM | POA: Diagnosis not present

## 2020-08-19 DIAGNOSIS — Z1211 Encounter for screening for malignant neoplasm of colon: Secondary | ICD-10-CM | POA: Diagnosis present

## 2020-08-19 IMAGING — MG MM DIGITAL SCREENING BILAT W/ TOMO AND CAD
8 series · 9 of 24 positions shown · non-contrast
Comparison: Previous exam(s).

CLINICAL DATA: Screening.

EXAM:
DIGITAL SCREENING BILATERAL MAMMOGRAM WITH TOMOSYNTHESIS AND CAD
TECHNIQUE: Bilateral screening digital craniocaudal and mediolateral oblique
mammograms were obtained. Bilateral screening digital breast
tomosynthesis was performed. The images were evaluated with
computer-aided detection.

[L MLO synth-2D]
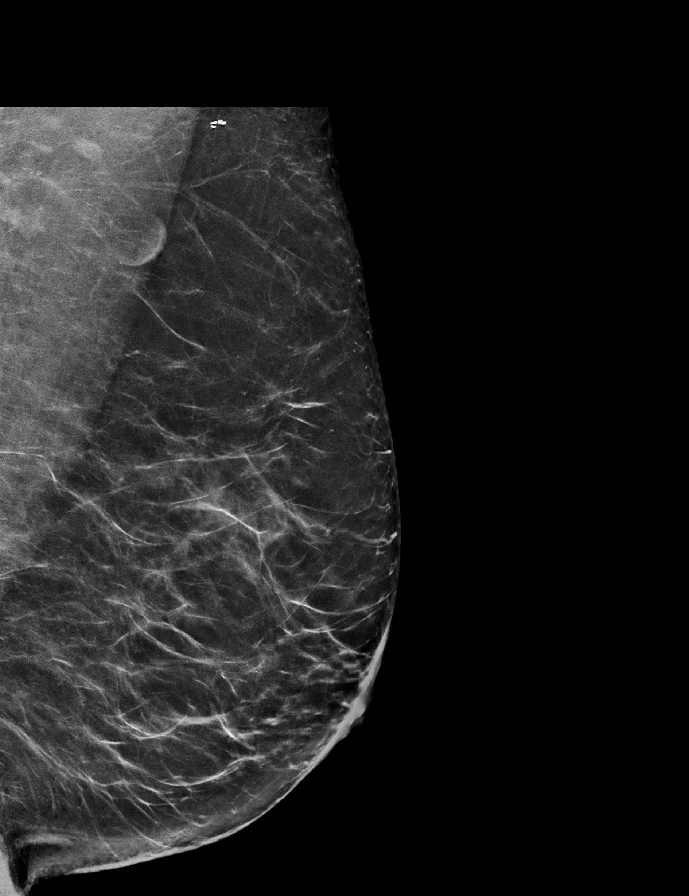

[R CC synth-2D]
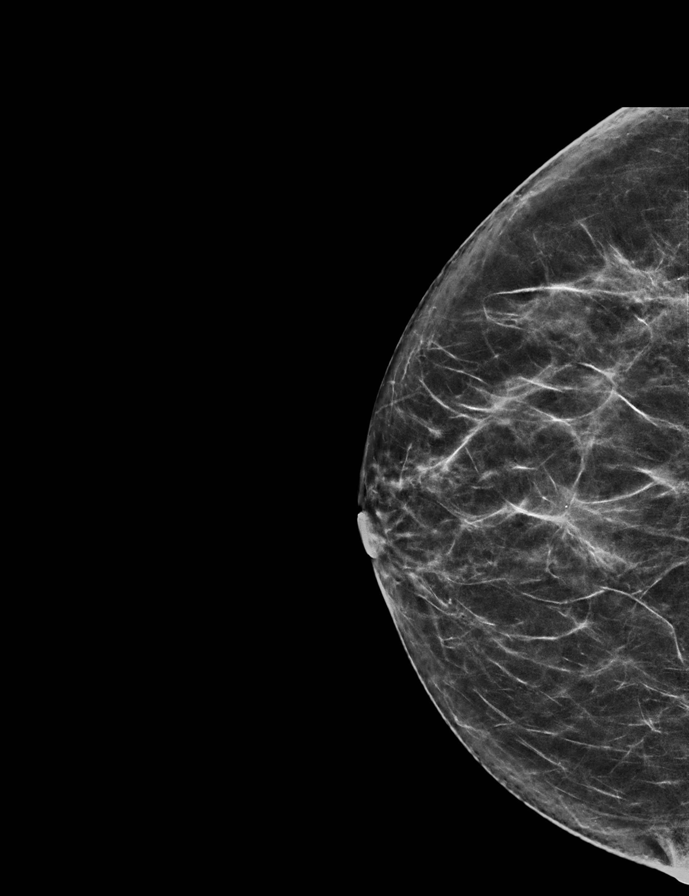

[R MLO synth-2D]
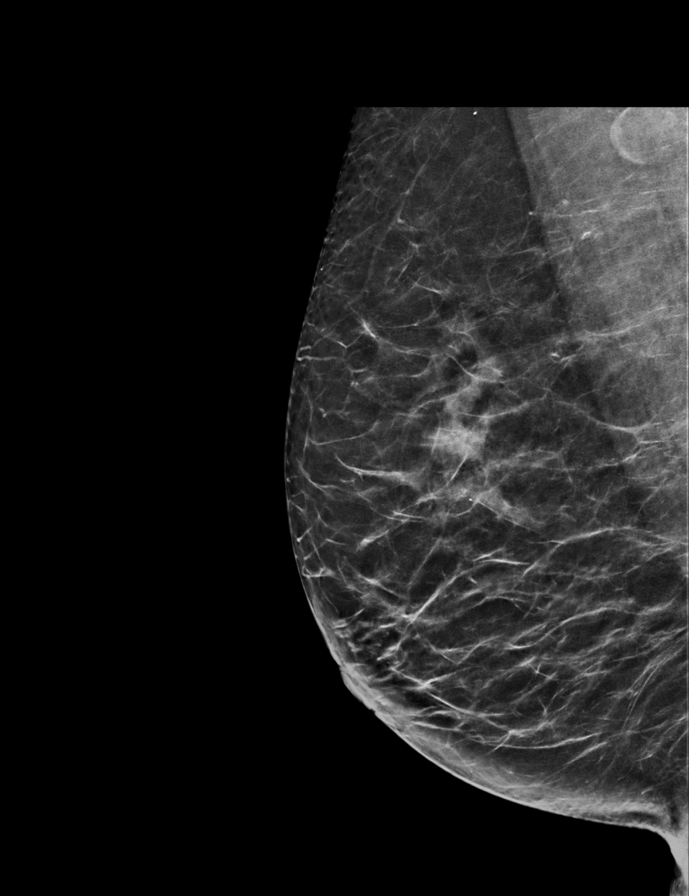

[L CC synth-2D]
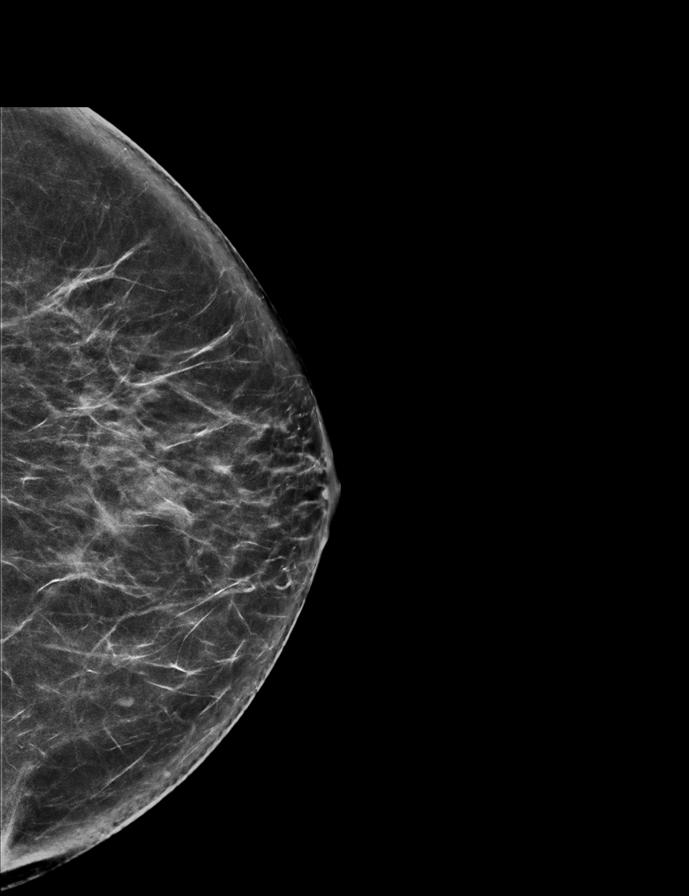

[L CC tomo · 2 of 67 frames shown]
[frame 22/67]
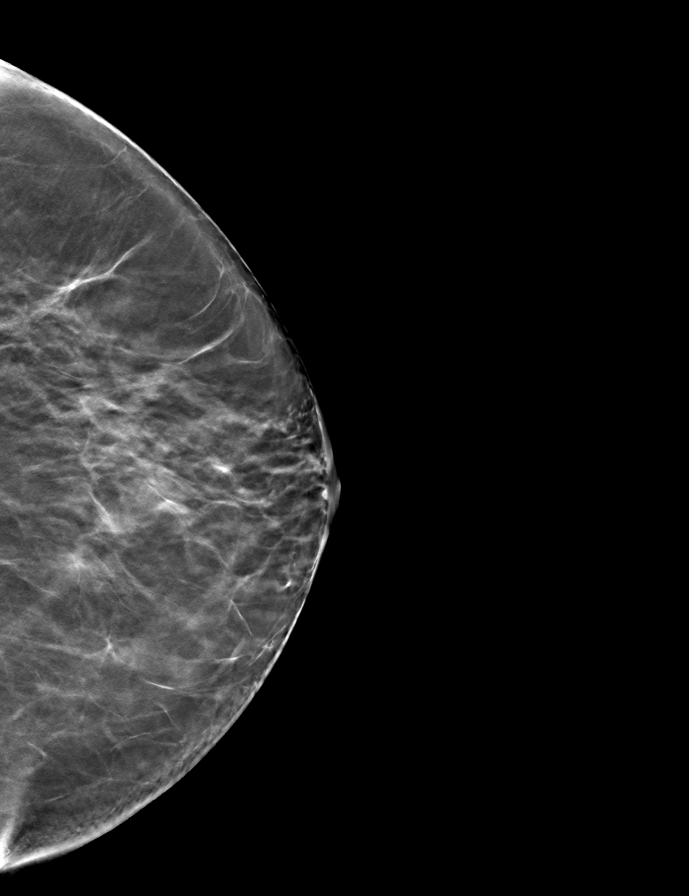
[frame 34/67]
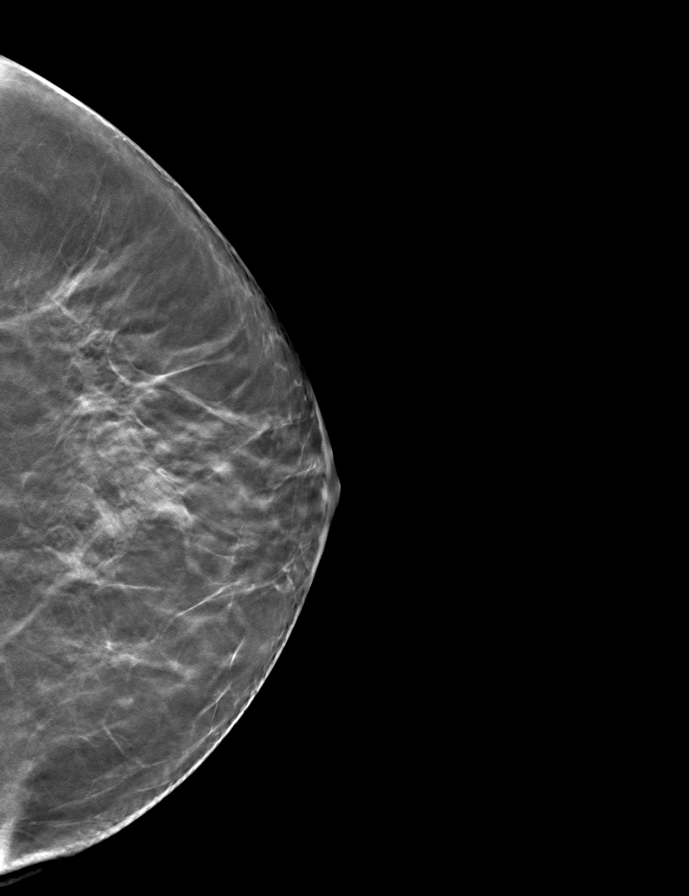

[R CC tomo · tomo slice 32/63.0]
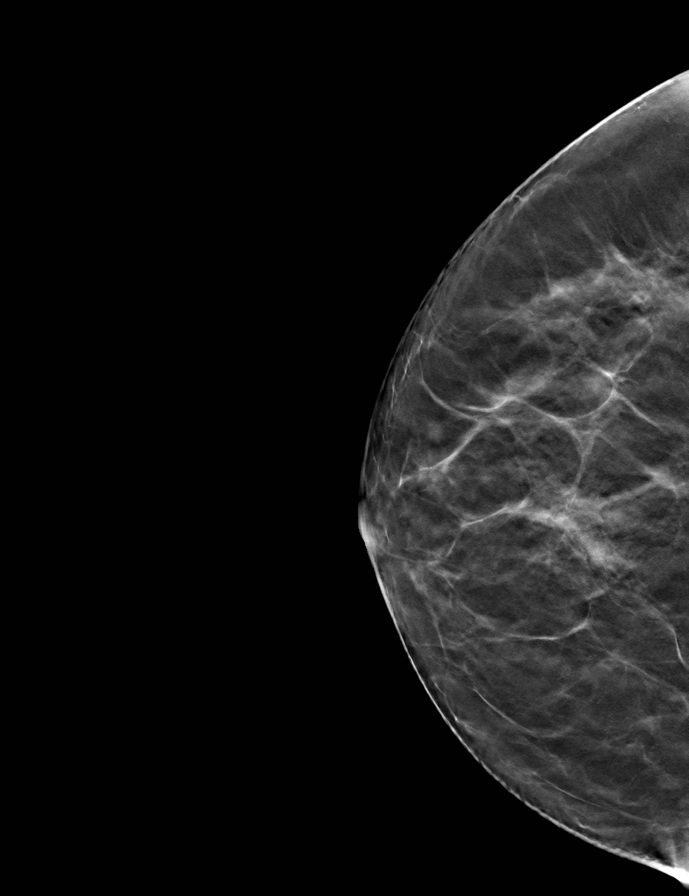

[R MLO tomo · tomo slice 33/65.0]
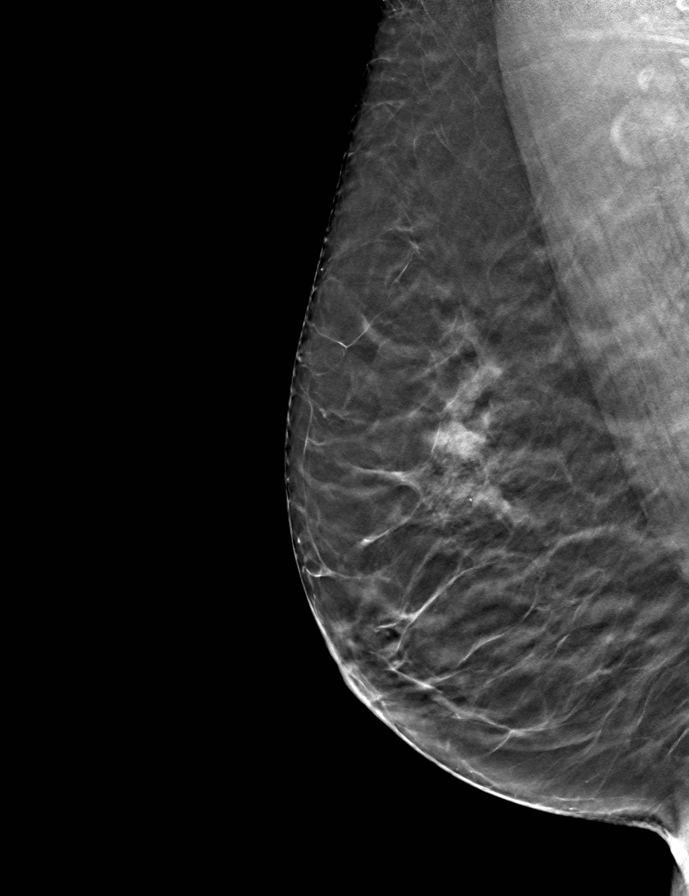

[L MLO tomo · tomo slice 37/72.0]
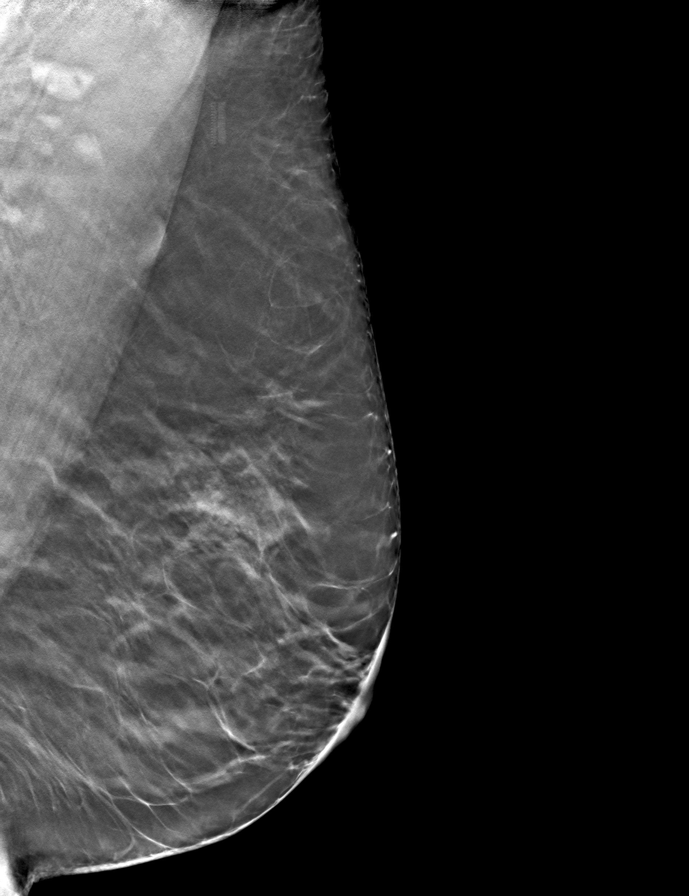

[9 of 24 positions shown; findings below may reference images not displayed]

ACR Breast Density Category b: There are scattered areas of
fibroglandular density.
FINDINGS: There are no findings suspicious for malignancy. The images were
evaluated with computer-aided detection.
IMPRESSION: No mammographic evidence of malignancy. A result letter of this
screening mammogram will be mailed directly to the patient.

RECOMMENDATION:
Screening mammogram in one year. (Code:[OD])

BI-RADS CATEGORY  1: Negative.

## 2020-08-22 LAB — CYTOLOGY - PAP
Comment: NEGATIVE
Comment: NEGATIVE
Diagnosis: NEGATIVE
HPV 16: NEGATIVE
HPV 18 / 45: NEGATIVE
High risk HPV: POSITIVE — AB

## 2020-08-23 ENCOUNTER — Telehealth: Payer: Self-pay | Admitting: Adult Health

## 2020-08-23 ENCOUNTER — Encounter: Payer: Self-pay | Admitting: Adult Health

## 2020-08-23 DIAGNOSIS — R87810 Cervical high risk human papillomavirus (HPV) DNA test positive: Secondary | ICD-10-CM

## 2020-08-23 HISTORY — DX: Cervical high risk human papillomavirus (HPV) DNA test positive: R87.810

## 2020-08-23 NOTE — Telephone Encounter (Signed)
Left message to call for colpo appt that pap +HPV but negative for malignancy

## 2020-08-25 ENCOUNTER — Encounter: Payer: Self-pay | Admitting: Hematology

## 2020-08-26 ENCOUNTER — Encounter: Payer: Commercial Managed Care - PPO | Admitting: Obstetrics & Gynecology

## 2020-09-05 ENCOUNTER — Telehealth: Payer: Self-pay | Admitting: Orthopaedic Surgery

## 2020-09-05 MED ORDER — HYDROCODONE-ACETAMINOPHEN 7.5-325 MG PO TABS: ORAL_TABLET | ORAL | 0 refills | Status: DC

## 2020-09-05 NOTE — Telephone Encounter (Signed)
Patient called request refill for her pain medicine   HYDROcodone-acetaminophen ([place]) 7.5-325 MG  Pharmacy: [facility]

## 2020-09-12 ENCOUNTER — Other Ambulatory Visit: Payer: Self-pay | Admitting: Adult Health

## 2020-09-12 DIAGNOSIS — Z1212 Encounter for screening for malignant neoplasm of rectum: Secondary | ICD-10-CM

## 2020-09-12 DIAGNOSIS — Z1211 Encounter for screening for malignant neoplasm of colon: Secondary | ICD-10-CM

## 2020-09-12 NOTE — Progress Notes (Signed)
Refer to Dr Laural Golden colonoscopy had seen Terri per [facility]

## 2020-09-13 ENCOUNTER — Ambulatory Visit: Payer: Commercial Managed Care - PPO | Admitting: Orthopaedic Surgery

## 2020-09-21 ENCOUNTER — Encounter (INDEPENDENT_AMBULATORY_CARE_PROVIDER_SITE_OTHER): Payer: Self-pay | Admitting: *Deleted

## 2020-09-22 ENCOUNTER — Encounter: Payer: Commercial Managed Care - PPO | Admitting: Obstetrics & Gynecology

## 2020-09-23 ENCOUNTER — Ambulatory Visit: Payer: Medicaid Other | Admitting: Hematology

## 2020-09-23 ENCOUNTER — Other Ambulatory Visit: Payer: Medicaid Other

## 2020-09-23 ENCOUNTER — Ambulatory Visit: Payer: Medicaid Other

## 2020-09-25 ENCOUNTER — Emergency Department (HOSPITAL_COMMUNITY)
Admission: EM | Admit: 2020-09-25 | Discharge: 2020-09-25 | Disposition: A | Discharge status: Home / Self Care | Payer: Commercial Managed Care - PPO | Attending: Emergency Medicine | Admitting: Emergency Medicine

## 2020-09-25 ENCOUNTER — Encounter (HOSPITAL_COMMUNITY): Payer: Self-pay | Admitting: Emergency Medicine

## 2020-09-25 ENCOUNTER — Other Ambulatory Visit: Payer: Self-pay

## 2020-09-25 DIAGNOSIS — F17210 Nicotine dependence, cigarettes, uncomplicated: Secondary | ICD-10-CM | POA: Insufficient documentation

## 2020-09-25 DIAGNOSIS — Z86011 Personal history of benign neoplasm of the brain: Secondary | ICD-10-CM | POA: Insufficient documentation

## 2020-09-25 DIAGNOSIS — Z8572 Personal history of non-Hodgkin lymphomas: Secondary | ICD-10-CM | POA: Insufficient documentation

## 2020-09-25 DIAGNOSIS — Z79899 Other long term (current) drug therapy: Secondary | ICD-10-CM | POA: Diagnosis not present

## 2020-09-25 DIAGNOSIS — R42 Dizziness and giddiness: Secondary | ICD-10-CM | POA: Diagnosis present

## 2020-09-25 DIAGNOSIS — I10 Essential (primary) hypertension: Secondary | ICD-10-CM | POA: Diagnosis not present

## 2020-09-25 LAB — URINALYSIS, ROUTINE W REFLEX MICROSCOPIC
Bacteria, UA: NONE SEEN
Bilirubin Urine: NEGATIVE
Glucose, UA: NEGATIVE mg/dL
Ketones, ur: NEGATIVE mg/dL
Leukocytes,Ua: NEGATIVE
Nitrite: NEGATIVE
Protein, ur: NEGATIVE mg/dL
Specific Gravity, Urine: 1.01 (ref 1.005–1.030)
pH: 5 (ref 5.0–8.0)

## 2020-09-25 LAB — CBC
HCT: 37 % (ref 36.0–46.0)
Hemoglobin: 12.1 g/dL (ref 12.0–15.0)
MCH: 28.5 pg (ref 26.0–34.0)
MCHC: 32.7 g/dL (ref 30.0–36.0)
MCV: 87.1 fL (ref 80.0–100.0)
Platelets: 409 10*3/uL — ABNORMAL HIGH (ref 150–400)
RBC: 4.25 MIL/uL (ref 3.87–5.11)
RDW: 12.8 % (ref 11.5–15.5)
WBC: 7.6 10*3/uL (ref 4.0–10.5)
nRBC: 0 % (ref 0.0–0.2)

## 2020-09-25 LAB — BASIC METABOLIC PANEL
Anion gap: 10 (ref 5–15)
BUN: 18 mg/dL (ref 6–20)
CO2: 27 mmol/L (ref 22–32)
Calcium: 9.6 mg/dL (ref 8.9–10.3)
Chloride: 101 mmol/L (ref 98–111)
Creatinine, Ser: 0.72 mg/dL (ref 0.44–1.00)
GFR, Estimated: >60 mL/min (ref >60)
Glucose, Bld: 100 mg/dL — ABNORMAL HIGH (ref 70–99)
Potassium: 3.6 mmol/L (ref 3.5–5.1)
Sodium: 138 mmol/L (ref 135–145)

## 2020-09-25 MED ORDER — MECLIZINE HCL 12.5 MG PO TABS: 12.5000 mg | ORAL_TABLET | Freq: Three times a day (TID) | ORAL | 0 refills | Status: DC | PRN

## 2020-09-25 MED ORDER — MECLIZINE HCL 12.5 MG PO TABS
12.5000 mg | ORAL_TABLET | Freq: Once | ORAL | Status: DC
  Filled 2020-09-25: qty 1

## 2020-09-25 NOTE — ED Notes (Signed)
Pt reports blood in urine, pt to bathroom to obtain urine specimen.

## 2020-09-25 NOTE — ED Notes (Signed)
At discharge pt requests dose of meclizine prior to leaving, Dr. Alvino Chapel notified.

## 2020-09-25 NOTE — ED Triage Notes (Signed)
Dizziness that comes and goes for the past couple of days.

## 2020-09-27 NOTE — ED Provider Notes (Signed)
[facility] EMERGENCY DEPARTMENT Provider Note   CSN: [id number] Arrival date & time: 09/25/20  1914     History Chief Complaint  Patient presents with   Dizziness    Amber Drake is a 60 y.o. female.   Dizziness Associated symptoms: no headaches, no shortness of breath and no weakness   Patient presents with dizziness.  Has been coming and going.  Will last few seconds.  Feels as if the room spins.  Not worse with head movements.  No headache.  No confusion.  No numbness weakness.  No headache.  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  Previous surgery.  MRI a month ago reassuring.  Has had some dizziness before.  No ringing in ears.  No loss of hearing.    Past Medical History:  Diagnosis Date   Anemia    Arthritis    Blood dyscrasia    sickle cell trait   Cancer ([place])    lymphoma   Carbuncle and furuncle    Hypercholesterolemia    Hypertension    does not take meds   Papanicolaou smear of cervix with positive high risk human papilloma virus (HPV) test 08/23/2020   08/23/20    [place] per ASCCP guidelines, immediate risk of CIN 3+ is 4.1 %   Sickle cell trait [facility])     Patient Active Problem List   Diagnosis Date Noted   Papanicolaou smear of cervix with positive high risk human papilloma virus (HPV) test 08/23/2020   Hemorrhoids 08/16/2020   Encounter for screening fecal occult blood testing 08/16/2020   Screening mammogram for breast cancer 08/16/2020   Encounter for gynecological examination with Papanicolaou smear of cervix 08/16/2020   Screening for colorectal cancer 08/16/2020   Allergic reaction to adhesive 08/16/2020   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   Counseling regarding advance care planning and goals of care 10/23/2018   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   Brain tumor ([place]) 07/28/2018   ANA positive 03/24/2018   Carpal tunnel syndrome on both sides 03/24/2018   Chronic pain of both shoulders 03/24/2018    Hidradenitis 07/31/2016   Recurrent boils 07/31/2016   Hyperlipidemia 06/15/2015   Pain of left hand 05/19/2015   Hand pain, right 05/19/2015   Hematuria 03/03/2015   Esophageal reflux 03/03/2015   Essential hypertension, benign 03/03/2015   Bronchitis due to tobacco use 10/17/2010    Past Surgical History:  Procedure Laterality Date   APPENDECTOMY     APPLICATION OF CRANIAL NAVIGATION N/A 07/28/2018   Procedure: APPLICATION OF CRANIAL NAVIGATION;  Surgeon: Kary Kos, MD;  Location: [place];  Service: Neurosurgery;  Laterality: N/A;   BRAIN SURGERY     CARPAL TUNNEL RELEASE  03/23/2011   Procedure: CARPAL TUNNEL RELEASE;  Surgeon: Sanjuana Kava;  Location: [facility];  Service: Orthopedics;  Laterality: Left;   CARPAL TUNNEL RELEASE  05/10/2011   Procedure: CARPAL TUNNEL RELEASE;  Surgeon: Sanjuana Kava, MD;  Location: [facility];  Service: Orthopedics;  Laterality: Right;   CESAREAN SECTION     x 2   INCISION AND DRAINAGE PERIRECTAL ABSCESS  12/21/09   PR DURAL GRAFT REPAIR,SPINE DEFECT N/A 07/28/2018   Procedure: Stereotactic open biopsy of Right cerebellar hemisphere and dura with brainlab;  Surgeon: Kary Kos, MD;  Location: [place];  Service: Neurosurgery;  Laterality: N/A;  Stereotactic open biopsy of Right cerebellar hemisphere and dura with brainlab   PROCTOSCOPY  10/17/2010   Procedure: PROCTOSCOPY;  Surgeon: Scherry Ran;  Location: [facility];  Service: General;  Laterality: N/A;  Rigid Proctoscopy/Possible Fistula in Ano  Procedure ended at [place]     x2     OB History     Gravida  4   Para  2   Term  2   Preterm      AB  2   Living  2      SAB      IAB      Ectopic      Multiple      Live Births  2           Family History  Problem Relation Age of Onset   Kidney failure Daughter    Sickle cell anemia Daughter    Sickle cell trait Daughter    Sickle cell anemia Son    Hypertension Mother    Hyperlipidemia Mother     Hypertension Father    Lupus Father        skin   Hypertension Other    Sarcoidosis Sister    Anesthesia problems Neg Hx    Hypotension Neg Hx    Malignant hyperthermia Neg Hx    Pseudochol deficiency Neg Hx     Social History   Tobacco Use   Smoking status: Every Day    Packs/day: 0.50    Years: 30.00    Pack years: 15.00    Types: Cigarettes   Smokeless tobacco: Never  Vaping Use   Vaping Use: Never used  Substance Use Topics   Alcohol use: No   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 Authorizing Provider  atorvastatin (LIPITOR) 20 MG tablet Take 1 tablet (20 mg total) by mouth daily. 03/14/15  Yes Soyla Dryer, PA-C  cyclobenzaprine (FLEXERIL) 10 MG tablet Take 1 tablet (10 mg total) by mouth at bedtime. One tablet every night at bedtime as needed for spasm. 05/31/20  Yes Sanjuana Kava, MD  HYDROcodone-acetaminophen (NORCO) 7.5-325 MG tablet One tablet every six hours as needed for pain. 09/05/20  Yes Sanjuana Kava, MD  ibuprofen (ADVIL) 800 MG tablet Take 1 tablet (800 mg total) by mouth every 8 (eight) hours as needed. 10/13/19  Yes Milton Ferguson, MD  lisinopril-hydrochlorothiazide (PRINZIDE,ZESTORETIC) 20-12.5 MG tablet Take 1 tablet by mouth daily.   Yes [provider]  meclizine (ANTIVERT) 12.5 MG tablet Take 1 tablet (12.5 mg total) by mouth 3 (three) times daily as needed for dizziness. 09/25/20  Yes Davonna Belling, MD  Multiple Vitamin (MULTIVITAMIN WITH MINERALS) TABS tablet Take 1 tablet by mouth daily.   Yes [provider]  potassium chloride SA (KLOR-CON) 20 MEQ tablet TAKE (1) TABLET BY MOUTH TWICE DAILY. Patient taking differently: Take 20 mEq by mouth once. 07/25/20  Yes Brunetta Genera, MD  ferrous sulfate 325 (65 FE) MG tablet Take 325 mg by mouth daily with breakfast. Patient not taking: Reported on 08/16/2020    [provider]  methylPREDNISolone (MEDROL DOSEPAK) 4 MG TBPK tablet Take by mouth. Patient not taking: No sig reported 08/25/20   [provider]  naproxen (NAPROSYN) 500 MG tablet Take 1 tablet (500 mg total) by mouth 2 (two) times daily with a meal. Patient not taking: No sig reported 05/31/20   Sanjuana Kava, MD    Allergies    Penicillins, Penicillins cross reactors, and Tape  Review of Systems   Review of Systems  Constitutional:  Positive for appetite change.  HENT:  Negative for congestion.   Eyes:  Negative for visual disturbance.  Respiratory:  Negative for shortness of breath.   Gastrointestinal:  Negative for abdominal pain.  Genitourinary:  Negative for flank pain.  Musculoskeletal:  Negative for back pain.  Neurological:  Positive for dizziness. Negative for weakness and headaches.  Psychiatric/Behavioral:  Negative for confusion.    Physical Exam Updated Vital Signs BP 128/68   Pulse 68   Temp 98.6 F (37 C) (Oral)   Resp 20   Wt 76.2 kg   LMP 08/12/2010   SpO2 96%   BMI 26.31 kg/m   Physical Exam Vitals and nursing note reviewed.  HENT:     Head: Atraumatic.  Eyes:     Pupils: Pupils are equal, round, and reactive to light.  Cardiovascular:     Rate and Rhythm: Regular rhythm.  Pulmonary:     Breath sounds: No wheezing.  Abdominal:     Tenderness: There is no abdominal tenderness.  Musculoskeletal:        General: No tenderness.     Cervical back: Neck supple.  Skin:    Capillary Refill: Capillary refill takes less than 2 seconds.  Neurological:     Mental Status: She is alert and oriented to person, place, and time.     Comments: Mild nystagmus with lateral gaze. finger-nose intact bilaterally.  Heel shin intact bilaterally    ED Results / Procedures / Treatments   Labs (all labs ordered are listed, but only abnormal results are displayed) Labs Reviewed  CBC - Abnormal; Notable for the following components:      Result Value    Platelets 409 (*)    All other components within normal limits  URINALYSIS, ROUTINE W REFLEX MICROSCOPIC - Abnormal; Notable for the following components:   Color, Urine STRAW (*)    Hgb urine dipstick MODERATE (*)    All other components within normal limits  BASIC METABOLIC PANEL - Abnormal; Notable for the following components:   Glucose, Bld 100 (*)    All other components within normal limits    EKG None  Radiology No results found.  Procedures Procedures   Medications Ordered in ED Medications - No data to display  ED Course  I have reviewed the triage vital signs and the nursing notes.  Pertinent labs & imaging results that were available during my care of the patient were reviewed by me and considered in my medical decision making (see chart for details).    MDM Rules/Calculators/A&P                          Patient with vertigo.  Comes and goes.  Short.  No other deficits with it.  Likely peripheral but patient has had previous posterior abnormalities in her head with cancer.  MRI month ago reassuring.  Discussed with patient about getting MRI done the next day but patient states she rather go home and have her oncologist tomorrow follow-up.  Will discharge home Final Clinical Impression(s) / ED Diagnoses Final diagnoses:  Vertigo    Rx / DC Orders ED Discharge Orders          Ordered    meclizine (ANTIVERT) 12.5 MG tablet  3 times daily PRN        09/25/20 2312  Davonna Belling, MD 09/27/20 [id number]

## 2020-09-28 ENCOUNTER — Telehealth: Payer: Self-pay | Admitting: Orthopaedic Surgery

## 2020-09-28 NOTE — Telephone Encounter (Signed)
Patient called to request refill: HYDROcodone-acetaminophen (NORCO) 7.5-325 MG tablet 40 tablet                [employer]               - states injection did not seem to help. Patient offered appointment, unless other advice?

## 2020-09-29 MED ORDER — HYDROCODONE-ACETAMINOPHEN 7.5-325 MG PO TABS: ORAL_TABLET | ORAL | 0 refills | Status: DC

## 2020-09-29 NOTE — Progress Notes (Signed)
[place], Maynard, MD [address] 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 female returns today for management and evaluation of her CNS Extranodal Marginal Zone Lymphoma. The patient's last visit with [place] was on 08/18/2020. The pt reports that she is doing fairly well overall.   In the interval since her last appointment, the patient presented to the emergency room with a chief complaint of vertigo which was new in onset at that time.  The patient recently had a repeat brain MRI approximately 1 month ago on 08/14/20 which showed an unchanged appearance of the brain no evidence of recurrent intracranial lymphoma.  From reviewing the ER provider's note, her exam was reassuring without any neurological deficits.  Apparently the patient was offered repeat imaging studies which she apparently declined since she recently had imaging and opted to follow-up with our clinic.  The patient was given a prescription for meclizine.  Her vertigo is similar at this time but the meclizine helps some. She states she feels it 1-2x per day and lasts a few seconds before resolving. She denies any associated symptoms. Specifically, the patient denies any headaches, numbness, tingling, weakness, nausea, vomiting, confusion, speech change, visual changes, tinnitus, or falls. She feels it when she is sitting and moves her head to a certain position. They evaluated her ear without any abnormal findings on visual inspection. She also completed a prescription for a medrol dosepak. Of note, a few days prior to her vertigo, she received some injection in her back for a pinched nerve. Naturally, the patient wants to know if her vertigo is cancer related. She needs a refill of the meclizine.   The patient denies any rashes or skin changes.  She  denies any recent fever, chills, night sweats, or weight loss.  She denies any Adenopathy.  She denies any abdominal pain, nausea, vomiting, diarrhea, constipation, or jaundice.  She denies any recent signs and symptoms of infection including upper respiratory infection, skin infections, or dysuria.  The patient denies any chest pain or shortness of breath.  She is here today for evaluation before starting another cycle of maintenance Rituxan.  MEDICAL HISTORY: Past Medical History:  Diagnosis Date   Anemia    Arthritis    Blood dyscrasia    sickle cell trait   Cancer ([place])    lymphoma   Carbuncle and furuncle    Hypercholesterolemia    Hypertension    does not take meds   Papanicolaou smear of cervix with positive high risk human papilloma virus (HPV) test 08/23/2020   08/23/20    [place] per ASCCP guidelines, immediate risk of CIN 3+ is 4.1 %   Sickle cell trait ([facility])     ALLERGIES:  is allergic to penicillins, penicillins cross reactors, and tape.  MEDICATIONS:  Current Outpatient Medications  Medication Sig Dispense Refill   atorvastatin (LIPITOR) 20 MG tablet Take 1 tablet (20 mg total) by mouth daily. 90 tablet 2   cyclobenzaprine (FLEXERIL) 10 MG tablet Take 1 tablet (10 mg total) by mouth at bedtime. One tablet every night at bedtime as needed for spasm. 30 tablet 0   ferrous sulfate 325 (65 FE) MG tablet Take 325 mg by mouth daily with breakfast.     HYDROcodone-acetaminophen (NORCO) 7.5-325 MG tablet One tablet every six hours as needed for pain. 40 tablet 0  ibuprofen (ADVIL) 800 MG tablet Take 1 tablet (800 mg total) by mouth every 8 (eight) hours as needed. 20 tablet 1   lisinopril-hydrochlorothiazide (PRINZIDE,ZESTORETIC) 20-12.5 MG tablet Take 1 tablet by mouth daily.     meclizine (ANTIVERT) 12.5 MG tablet Take 1 tablet (12.5 mg total) by mouth 3 (three) times daily as needed for dizziness. 8 tablet 0   Multiple Vitamin (MULTIVITAMIN WITH MINERALS) TABS tablet Take 1  tablet by mouth daily.     naproxen (NAPROSYN) 500 MG tablet Take 1 tablet (500 mg total) by mouth 2 (two) times daily with a meal. 60 tablet 5   potassium chloride SA (KLOR-CON) 20 MEQ tablet TAKE (1) TABLET BY MOUTH TWICE DAILY. (Patient taking differently: Take 20 mEq by mouth once.) 60 tablet 1   No current facility-administered medications for this visit.   Facility-Administered Medications Ordered in Other Visits  Medication Dose Route Frequency Provider Last Rate Last Admin   0.9 %  sodium chloride infusion   Intravenous Once Brunetta Genera, MD       acetaminophen (TYLENOL) tablet 500 mg  500 mg Oral Once Brunetta Genera, MD       And   oxyCODONE (Oxy IR/ROXICODONE) immediate release tablet 5 mg  5 mg Oral Once Brunetta Genera, MD       acetaminophen (TYLENOL) tablet 650 mg  650 mg Oral Once Brunetta Genera, MD       dexamethasone (DECADRON) 10 mg in sodium chloride 0.9 % 50 mL IVPB  10 mg Intravenous Once Brunetta Genera, MD       dexamethasone (DECADRON) 10 mg in sodium chloride 0.9 % 50 mL IVPB  10 mg Intravenous Once Brunetta Genera, MD       diphenhydrAMINE (BENADRYL) capsule 50 mg  50 mg Oral Once Brunetta Genera, MD       famotidine (PEPCID) IVPB 20 mg in NS 100 mL IVPB  20 mg Intravenous Once Brunetta Genera, MD       riTUXimab-pvvr (RUXIENCE) 700 mg in sodium chloride 0.9 % 180 mL infusion  375 mg/m2 (Treatment Plan Recorded) Intravenous Once Brunetta Genera, MD        SURGICAL HISTORY:  Past Surgical History:  Procedure Laterality Date   APPENDECTOMY     APPLICATION OF CRANIAL NAVIGATION N/A 07/28/2018   Procedure: APPLICATION OF CRANIAL NAVIGATION;  Surgeon: Kary Kos, MD;  Location: [place];  Service: Neurosurgery;  Laterality: N/A;   BRAIN SURGERY     CARPAL TUNNEL RELEASE  03/23/2011   Procedure: CARPAL TUNNEL RELEASE;  Surgeon: Sanjuana Kava;  Location: [facility];  Service: Orthopedics;  Laterality: Left;   CARPAL TUNNEL  RELEASE  05/10/2011   Procedure: CARPAL TUNNEL RELEASE;  Surgeon: Sanjuana Kava, MD;  Location[facility];  Service: Orthopedics;  Laterality: Right;   CESAREAN SECTION     x 2   INCISION AND DRAINAGE PERIRECTAL ABSCESS  12/21/09   PR DURAL GRAFT REPAIR,SPINE DEFECT N/A 07/28/2018   Procedure: Stereotactic open biopsy of Right cerebellar hemisphere and dura with brainlab;  Surgeon: Kary Kos, MD;  Location: [place];  Service: Neurosurgery;  Laterality: N/A;  Stereotactic open biopsy of Right cerebellar hemisphere and dura with brainlab   PROCTOSCOPY  10/17/2010   Procedure: PROCTOSCOPY;  Surgeon: Scherry Ran;  Location[facility];  Service: General;  Laterality: N/A;  Rigid Proctoscopy/Possible Fistula in Ano  Procedure ended at [place]     x2  REVIEW OF SYSTEMS:   Review of Systems  Constitutional: Negative for appetite change, chills, fatigue, fever and unexpected weight change.  HENT: Negative for mouth sores, nosebleeds, sore throat and trouble swallowing.   Eyes: Negative for eye problems and icterus.  Respiratory: Negative for cough, hemoptysis, shortness of breath and wheezing.   Cardiovascular: Negative for chest pain and leg swelling.  Gastrointestinal: Negative for abdominal pain, constipation, diarrhea, nausea and vomiting.  Genitourinary: Negative for bladder incontinence, difficulty urinating, dysuria, frequency and hematuria.   Musculoskeletal: Negative for back pain, gait problem, neck pain and neck stiffness.  Skin: Negative for itching and rash.  Neurological: Positive for intermittent dizziness lasting a few seconds. Negative for dizziness, extremity weakness, gait problem, headaches, light-headedness and seizures.  Hematological: Negative for adenopathy. Does not bruise/bleed easily.  Psychiatric/Behavioral: Negative for confusion, depression and sleep disturbance. The patient is not nervous/anxious.     PHYSICAL EXAMINATION:  Blood pressure 128/64,  pulse 68, temperature 97.9 F (36.6 C), temperature source Tympanic, resp. rate 18, height 5\' 7"  (1.702 m), weight 170 lb 14.4 oz (77.5 kg), last menstrual period 08/12/2010, SpO2 100 %.  ECOG PERFORMANCE STATUS: 0-1  Physical Exam  Constitutional: Oriented to person, place, and time and well-developed, well-nourished, and in no distress.  HENT:  Head: Normocephalic and atraumatic.  Mouth/Throat: Oropharynx is clear and moist. No oropharyngeal exudate.  Eyes: Conjunctivae are normal. Right eye exhibits no discharge. Left eye exhibits no discharge. No scleral icterus.  Neck: Normal range of motion. Neck supple.  Cardiovascular: Normal rate, regular rhythm, normal heart sounds and intact distal pulses.   Pulmonary/Chest: Effort normal and breath sounds normal. No respiratory distress. No wheezes. No rales.  Abdominal: Soft. Bowel sounds are normal. Exhibits no distension and no mass. There is no tenderness.  Musculoskeletal: Normal range of motion. Exhibits no edema.  Lymphadenopathy:    No cervical adenopathy.  Neurological: Alert and oriented to person, place, and time. Exhibits normal muscle tone. Gait normal. Coordination normal. Cranial nerves II-XII grossly intact. Strength 5/5 and equal and symmetric in upper and lower extremities. Finger to nose intact.  Skin: Skin is warm and dry. No rash noted. Not diaphoretic. No erythema. No pallor.  Psychiatric: Mood, memory and judgment normal.  Vitals reviewed.  LABORATORY DATA: Lab Results  Component Value Date   WBC 6.7 09/30/2020   HGB 12.7 09/30/2020   HCT 37.1 09/30/2020   MCV 84.9 09/30/2020   PLT 375 09/30/2020      Chemistry      Component Value Date/Time   NA 143 09/30/2020 0950   K 3.5 09/30/2020 0950   CL 105 09/30/2020 0950   CO2 28 09/30/2020 0950   BUN 8 09/30/2020 0950   CREATININE 0.78 09/30/2020 0950   CREATININE 0.92 05/04/2015 0810      Component Value Date/Time   CALCIUM 9.8 09/30/2020 0950   ALKPHOS 75  09/30/2020 0950   AST 13 (L) 09/30/2020 0950   ALT 12 09/30/2020 0950   BILITOT 0.2 (L) 09/30/2020 0950       RADIOGRAPHIC STUDIES:  No results found.   ASSESSMENT/PLAN:  60 y.o. female with   1. 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 The process appears to extend into the upper cervical canal. Mild obstructive hydrocephalus. Differential includes tumor including metastatic disease and lymphoma. Chronic inflammatory process such as sarcoid is a consideration however chest x-ray negative. TB and atypical infection/fungus also possible. 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 2. Nonspecific focus of increased uptake within the cord extending from T12 to L1. Cannot rule out additional site of CNS lymphoma. Consider further evaluation with contrast enhanced MRI through this area. 3. Aortic Atherosclerosis and Emphysema."   10/16/2018 MRI brain w and w/o contrast revealed "1. Resolved dural mass in the right posterior fossa with minimal smooth dural thickening that may be treatment related. Cerebellar edema and mass effect is also resolved. No new site of disease. 2. 6 mm colloid cyst."   10/20/2018 MRI cervical spine w and w/o contrast revealed "Negative for lymphoma.  No acute  abnormality. Central disc protrusion at C5-6 effaces the ventral thecal sac causing mild central canal narrowing. There is also a shallow disc bulge at C6-7 which narrows but does not efface the ventral thecal Sac."   01/13/2019 MRI Brain ([id number]) revealed "1. Stable and satisfactory post treatment appearance of the posterior fossa. 2. Stable small 6 mm colloid cyst. 3. No new intracranial abnormality."   05/06/2019 MRI Cervical Spine ([id number]) revealed "No evidence of lymphoma in the cervical spine. Previously noted dural enhancement in the posterior fossa and cervical canal has resolved. No cord compression or cord lesion. Chronic cervical spine degenerative changes are stable from the prior study."   12/15/2019 MRI Brain ([id number]) revealed "No acute intracranial abnormality. No enhancing mass lesion 5 mm colloid cyst in the third ventricle is chronic and unchanged from prior studies."  08/14/2020, MR Brain ([id number]) which revealed "1. Unchanged appearance of the brain. No evidence of recurrent intracranial lymphoma. 2. Small colloid cyst without hydrocephalus."   PLAN: -Repeat brain MRI on 08/14/20 was reassuring without any evidence of recurrence. Vertigo. Advised pt that there is no lab or clinical evidence of CNS Extranodal Marginal Zone Lymphoma recurrence at this time. I have discussed with Dr. Irene Limbo who also agreed that if there are no focal or neurological symptoms/cerebellar signs, that we do not need to jump to repeat neuro-imaging at this time. I have discussed this at length with the patient. Of course, should she develop persistent or worsening symptoms, advised to call us back for further consideration of evaluation or see her PCP. She will call her PCP for refill of meclizine which helps somewhat. Of course, if she develops concerning/emergent symptoms such as headaches, visual changes, extremity weakness, speech changes, then she should be evaluated in the ER.  -Will continue  maintenance Rituxan q8weeks for up to three years. The pt has no prohibitive toxicities. -Continue to avoid Ibuprofen, use OTC Tylenol for pain management. -Advised pt we continue to monitor labs and symptoms for progression of disease. -Will see back as scheduled with her next cycle of Rituxan        FOLLOW UP: Follow-up as scheduled for next cycle of Rituxan labs and MD visit on 8 /30/2022           No orders of the defined  types were placed in this encounter.    The total time spent in the appointment was 20-29 minutes  Caeley Dohrmann L Romie Tay, PA-C 09/30/20

## 2020-09-30 ENCOUNTER — Inpatient Hospital Stay: Payer: Commercial Managed Care - PPO | Attending: Hematology

## 2020-09-30 ENCOUNTER — Other Ambulatory Visit: Payer: Self-pay

## 2020-09-30 ENCOUNTER — Ambulatory Visit: Payer: Medicaid Other | Admitting: Hematology

## 2020-09-30 ENCOUNTER — Other Ambulatory Visit: Payer: Self-pay | Admitting: Hematology

## 2020-09-30 ENCOUNTER — Inpatient Hospital Stay (HOSPITAL_BASED_OUTPATIENT_CLINIC_OR_DEPARTMENT_OTHER): Payer: Commercial Managed Care - PPO | Admitting: Physician Assistant

## 2020-09-30 ENCOUNTER — Inpatient Hospital Stay: Payer: Commercial Managed Care - PPO

## 2020-09-30 VITALS — BP 128/64 | HR 68 | Temp 97.9°F | Resp 18 | Ht 67.0 in | Wt 170.9 lb

## 2020-09-30 VITALS — BP 120/66 | HR 73 | Temp 98.5°F | Resp 16

## 2020-09-30 DIAGNOSIS — C8331 Diffuse large B-cell lymphoma, lymph nodes of head, face, and neck: Secondary | ICD-10-CM | POA: Diagnosis not present

## 2020-09-30 DIAGNOSIS — Z79899 Other long term (current) drug therapy: Secondary | ICD-10-CM | POA: Diagnosis not present

## 2020-09-30 DIAGNOSIS — C884 Extranodal marginal zone B-cell lymphoma of mucosa-associated lymphoid tissue [MALT-lymphoma]: Secondary | ICD-10-CM

## 2020-09-30 DIAGNOSIS — Z7189 Other specified counseling: Secondary | ICD-10-CM

## 2020-09-30 DIAGNOSIS — Z5112 Encounter for antineoplastic immunotherapy: Secondary | ICD-10-CM | POA: Insufficient documentation

## 2020-09-30 LAB — CBC WITH DIFFERENTIAL/PLATELET
Abs Immature Granulocytes: 0.03 10*3/uL (ref 0.00–0.07)
Basophils Absolute: 0 10*3/uL (ref 0.0–0.1)
Basophils Relative: 0 %
Eosinophils Absolute: 0.1 10*3/uL (ref 0.0–0.5)
Eosinophils Relative: 1 %
HCT: 37.1 % (ref 36.0–46.0)
Hemoglobin: 12.7 g/dL (ref 12.0–15.0)
Immature Granulocytes: 0 %
Lymphocytes Relative: 30 %
Lymphs Abs: 2 10*3/uL (ref 0.7–4.0)
MCH: 29.1 pg (ref 26.0–34.0)
MCHC: 34.2 g/dL (ref 30.0–36.0)
MCV: 84.9 fL (ref 80.0–100.0)
Monocytes Absolute: 0.5 10*3/uL (ref 0.1–1.0)
Monocytes Relative: 7 %
Neutro Abs: 4.1 10*3/uL (ref 1.7–7.7)
Neutrophils Relative %: 62 %
Platelets: 375 10*3/uL (ref 150–400)
RBC: 4.37 MIL/uL (ref 3.87–5.11)
RDW: 12.8 % (ref 11.5–15.5)
WBC: 6.7 10*3/uL (ref 4.0–10.5)
nRBC: 0 % (ref 0.0–0.2)

## 2020-09-30 LAB — CMP (CANCER CENTER ONLY)
ALT: 12 U/L (ref 0–44)
AST: 13 U/L — ABNORMAL LOW (ref 15–41)
Albumin: 3.6 g/dL (ref 3.5–5.0)
Alkaline Phosphatase: 75 U/L (ref 38–126)
Anion gap: 10 (ref 5–15)
BUN: 8 mg/dL (ref 6–20)
CO2: 28 mmol/L (ref 22–32)
Calcium: 9.8 mg/dL (ref 8.9–10.3)
Chloride: 105 mmol/L (ref 98–111)
Creatinine: 0.78 mg/dL (ref 0.44–1.00)
GFR, Estimated: >60 mL/min (ref >60)
Glucose, Bld: 98 mg/dL (ref 70–99)
Potassium: 3.5 mmol/L (ref 3.5–5.1)
Sodium: 143 mmol/L (ref 135–145)
Total Bilirubin: 0.2 mg/dL — ABNORMAL LOW (ref 0.3–1.2)
Total Protein: 6.7 g/dL (ref 6.5–8.1)

## 2020-09-30 LAB — LACTATE DEHYDROGENASE: LDH: 157 U/L (ref 98–192)

## 2020-09-30 MED ORDER — FAMOTIDINE 20 MG IN NS 100 ML IVPB
20.0000 mg | Freq: Once | INTRAVENOUS | Status: AC
  Administered 2020-09-30: 20 mg via INTRAVENOUS

## 2020-09-30 MED ORDER — ACETAMINOPHEN 325 MG PO TABS
650.0000 mg | ORAL_TABLET | Freq: Once | ORAL | Status: AC
  Administered 2020-09-30: 650 mg via ORAL

## 2020-09-30 MED ORDER — SODIUM CHLORIDE 0.9 % IV SOLN
10.0000 mg | Freq: Once | INTRAVENOUS | Status: AC
  Administered 2020-09-30: 10 mg via INTRAVENOUS
  Filled 2020-09-30: qty 10

## 2020-09-30 MED ORDER — SODIUM CHLORIDE 0.9 % IV SOLN
Freq: Once | INTRAVENOUS | Status: AC
  Filled 2020-09-30: qty 250

## 2020-09-30 MED ORDER — SODIUM CHLORIDE 0.9 % IV SOLN
375.0000 mg/m2 | Freq: Once | INTRAVENOUS | Status: AC
  Administered 2020-09-30: 700 mg via INTRAVENOUS
  Filled 2020-09-30: qty 50

## 2020-09-30 MED ORDER — DIPHENHYDRAMINE HCL 25 MG PO CAPS
50.0000 mg | ORAL_CAPSULE | Freq: Once | ORAL | Status: AC
  Administered 2020-09-30: 50 mg via ORAL

## 2020-09-30 MED ORDER — DIPHENHYDRAMINE HCL 25 MG PO CAPS
ORAL_CAPSULE | ORAL | Status: AC
  Filled 2020-09-30: qty 2

## 2020-09-30 MED ORDER — FAMOTIDINE 20 MG IN NS 100 ML IVPB
INTRAVENOUS | Status: AC
  Filled 2020-09-30: qty 100

## 2020-09-30 MED ORDER — ACETAMINOPHEN 325 MG PO TABS
ORAL_TABLET | ORAL | Status: AC
  Filled 2020-09-30: qty 2

## 2020-09-30 NOTE — Patient Instructions (Signed)
[facility]  Discharge Instructions: Thank you for choosing [facility] to provide your oncology and hematology care.   If you have a lab appointment with the Cancer Center, please go directly to the Cancer Center and check in at the registration area.   Wear comfortable clothing and clothing appropriate for easy access to any Portacath or PICC line.   We strive to give you quality time with your provider. You may need to reschedule your appointment if you arrive late (15 or more minutes).  Arriving late affects you and other patients whose appointments are after yours.  Also, if you miss three or more appointments without notifying the office, you may be dismissed from the clinic at the provider's discretion.      For prescription refill requests, have your pharmacy contact our office and allow 72 hours for refills to be completed.    Today you received the following chemotherapy and/or immunotherapy agents Rituximab      To help prevent nausea and vomiting after your treatment, we encourage you to take your nausea medication as directed.  BELOW ARE SYMPTOMS THAT SHOULD BE REPORTED IMMEDIATELY: *FEVER GREATER THAN 100.4 F (38 C) OR HIGHER *CHILLS OR SWEATING *NAUSEA AND VOMITING THAT IS NOT CONTROLLED WITH YOUR NAUSEA MEDICATION *UNUSUAL SHORTNESS OF BREATH *UNUSUAL BRUISING OR BLEEDING *URINARY PROBLEMS (pain or burning when urinating, or frequent urination) *BOWEL PROBLEMS (unusual diarrhea, constipation, pain near the anus) TENDERNESS IN MOUTH AND THROAT WITH OR WITHOUT PRESENCE OF ULCERS (sore throat, sores in mouth, or a toothache) UNUSUAL RASH, SWELLING OR PAIN  UNUSUAL VAGINAL DISCHARGE OR ITCHING   Items with * indicate a potential emergency and should be followed up as soon as possible or go to the Emergency Department if any problems should occur.  Please show the CHEMOTHERAPY ALERT CARD or IMMUNOTHERAPY ALERT CARD at check-in to  the Emergency Department and triage nurse.  Should you have questions after your visit or need to cancel or reschedule your appointment, please contact [facility]  Dept: [phone]  and follow the prompts.  Office hours are 8:00 a.m. to 4:30 p.m. Monday - Friday. Please note that voicemails left after 4:00 p.m. may not be returned until the following business day.  We are closed weekends and major holidays. You have access to a nurse at all times for urgent questions. Please call the main number to the clinic Dept: [phone] and follow the prompts.   For any non-urgent questions, you may also contact your provider using MyChart. We now offer e-Visits for anyone 18 and older to request care online for non-urgent symptoms. For details visit [email or web address].   Also download the MyChart app! Go to the app store, search "MyChart", open the app, select [facility], and log in with your MyChart username and password.  Due to Covid, a mask is required upon entering the hospital/clinic. If you do not have a mask, one will be given to you upon arrival. For doctor visits, patients may have 1 support person aged 18 or older with them. For treatment visits, patients cannot have anyone with them due to current Covid guidelines and our immunocompromised population.   

## 2020-10-05 ENCOUNTER — Telehealth: Payer: Self-pay | Admitting: Physician Assistant

## 2020-10-05 NOTE — Telephone Encounter (Signed)
Scheduled per los. Called and was not able to leave msg. Mailed printout

## 2020-10-07 ENCOUNTER — Encounter: Payer: Commercial Managed Care - PPO | Admitting: Women's Health

## 2020-11-07 ENCOUNTER — Telehealth (INDEPENDENT_AMBULATORY_CARE_PROVIDER_SITE_OTHER): Payer: Self-pay

## 2020-11-07 ENCOUNTER — Encounter (INDEPENDENT_AMBULATORY_CARE_PROVIDER_SITE_OTHER): Payer: Self-pay

## 2020-11-07 MED ORDER — PEG 3350-KCL-NA BICARB-NACL 420 G PO SOLR: 4000.0000 mL | ORAL | 0 refills | Status: DC

## 2020-11-07 NOTE — Telephone Encounter (Signed)
LeighAnn Embrie Mikkelsen, CMA  

## 2020-11-08 ENCOUNTER — Encounter (INDEPENDENT_AMBULATORY_CARE_PROVIDER_SITE_OTHER): Payer: Self-pay

## 2020-11-08 ENCOUNTER — Telehealth (INDEPENDENT_AMBULATORY_CARE_PROVIDER_SITE_OTHER): Payer: Self-pay

## 2020-11-08 ENCOUNTER — Other Ambulatory Visit (INDEPENDENT_AMBULATORY_CARE_PROVIDER_SITE_OTHER): Payer: Self-pay

## 2020-11-08 MED ORDER — CLENPIQ 10-3.5-12 MG-GM -GM/160ML PO SOLN: 1.0000 | Freq: Once | ORAL | 0 refills | Status: AC

## 2020-11-08 NOTE — Telephone Encounter (Signed)
LeighAnn Malakhai Beitler, CMA  

## 2020-11-08 NOTE — Telephone Encounter (Signed)
Ok to schedule.  Thanks,  Amber Heikkila Castaneda Mayorga, MD Gastroenterology and Hepatology [facility]  

## 2020-11-08 NOTE — Telephone Encounter (Signed)
Referring MD/PCP: Derrek Monaco  Procedure: Tcs  Reason/Indication:  Screening  Has patient had this procedure before?  no  If so, when, by whom and where?    Is there a family history of colon cancer?  no  Who?  What age when diagnosed?    Is patient diabetic? If yes, Type 1 or Type 2   no      Does patient have prosthetic heart valve or mechanical valve?  no  Do you have a pacemaker/defibrillator?  no  Has patient ever had endocarditis/atrial fibrillation? no  Does patient use oxygen? no  Has patient had joint replacement within last 12 months?  no  Is patient constipated or do they take laxatives? no  Does patient have a history of alcohol/drug use?  no  Have you had a stroke/heart attack last 6 mths? no  Do you take medicine for weight loss?  no  For female patients,: do you still have your menstrual cycle? no  Is patient on blood thinner such as Coumadin, Plavix and/or Aspirin? no  Medications: lisinopril/hctz 20/12.5 mg daily, atorvastatin 20 mg daily  Allergies: pcn   Medication Adjustment per Dr Jenetta Downer pcn  Procedure date & time: 11/16/20 9:15

## 2020-11-15 ENCOUNTER — Other Ambulatory Visit: Payer: Self-pay | Admitting: Neurosurgery

## 2020-11-15 ENCOUNTER — Other Ambulatory Visit: Payer: Self-pay

## 2020-11-15 ENCOUNTER — Other Ambulatory Visit (HOSPITAL_COMMUNITY)
Admission: RE | Admit: 2020-11-15 | Discharge: 2020-11-15 | Disposition: A | Discharge status: Home / Self Care | Payer: Commercial Managed Care - PPO | Source: Ambulatory Visit | Attending: Women's Health | Admitting: Women's Health

## 2020-11-15 ENCOUNTER — Ambulatory Visit (INDEPENDENT_AMBULATORY_CARE_PROVIDER_SITE_OTHER): Payer: Commercial Managed Care - PPO | Admitting: Women's Health

## 2020-11-15 ENCOUNTER — Encounter: Payer: Self-pay | Admitting: Women's Health

## 2020-11-15 VITALS — BP 118/74 | HR 72 | Wt 170.0 lb

## 2020-11-15 DIAGNOSIS — R87810 Cervical high risk human papillomavirus (HPV) DNA test positive: Secondary | ICD-10-CM | POA: Diagnosis not present

## 2020-11-15 DIAGNOSIS — L732 Hidradenitis suppurativa: Secondary | ICD-10-CM

## 2020-11-15 MED ORDER — SULFAMETHOXAZOLE-TRIMETHOPRIM 800-160 MG PO TABS
1.0000 | ORAL_TABLET | Freq: Two times a day (BID) | ORAL | 0 refills | Status: DC
Start: 2020-11-15 — End: 2021-03-21

## 2020-11-15 NOTE — Patient Instructions (Signed)
[email or web address]  Colposcopy, Care After This sheet gives you information about how to care for yourself after your procedure. Your health care provider may also give you more specific instructions. If you have problems or questions, contact your health careprovider. What can I expect after the procedure? If you had a colposcopy without a biopsy, you can expect to feel fine right away after your procedure. However, you may have some spotting of blood for afew days. You can return to your normal activities. If you had a colposcopy with a biopsy, it is common after the procedure to have: Soreness and mild pain. These may last for a few days. Light-headedness. Mild vaginal bleeding or discharge that is dark-colored and grainy. This may last for a few days. The discharge may be caused by a liquid (solution) that was used during the procedure. You may need to wear a sanitary pad during this time. Spotting of blood for at least 48 hours after the procedure. Follow these instructions at home: Medicines Take over-the-counter and prescription medicines only as told by your health care provider. Talk with your health care provider about what type of over-the-counter pain medicine and prescription medicine you can start to take again. It is especially important to talk with your health care provider if you take blood thinners. Activity Limit your physical activity for the first day after your procedure as told by your health care provider. Avoid using douche products, using tampons, or having sex for at least 3 days after the procedure or for as long as told. Return to your normal activities as told by your health care provider. Ask your health care provider what activities are safe for you. General instructions  Drink enough fluid to keep your urine pale yellow. Ask your health care provider if you may take baths, swim, or use a hot tub. You may take showers. If you use  birth control (contraception), continue to use it. Keep all follow-up visits as told by your health care provider. This is important.  Contact a health care provider if: You develop a skin rash. Get help right away if: You bleed a lot from your vagina or pass blood clots. This includes using more than one sanitary pad each hour for 2 hours in a row. You have a fever or chills. You have vaginal discharge that is abnormal, is yellow in color, or smells bad. This could be a sign of infection. You have severe pain or cramps in your lower abdomen that do not go away with medicine. You faint. Summary If you had a colposcopy without a biopsy, you can expect to feel fine right away, but you may have some spotting of blood for a few days. You can return to your normal activities. If you had a colposcopy with a biopsy, it is common to have mild pain for a few days and spotting for 48 hours after the procedure. Avoid using douche products, using tampons, and having sex for at least 3 days after the procedure or for as long as told by your health care provider. Get help right away if you have heavy bleeding, severe pain, or signs of infection. This information is not intended to replace advice given to you by your health care provider. Make sure you discuss any questions you have with your healthcare provider. Document Revised: 03/18/2019 Document Reviewed: 03/18/2019 Elsevier Patient Education  2022 Elsevier Inc.  

## 2020-11-15 NOTE — Progress Notes (Signed)
   COLPOSCOPY PROCEDURE NOTE Patient name: Amber Drake MRN [id number]  Date of birth: [birth year] Subjective Findings:   Amber Drake is a 60 y.o. [phone] African American female being seen today for a colposcopy. Reports boils in vulvar area, 'gets them all the time and they give me antibiotics'.  Indication: Abnormal pap on 08/16/20: NILM w/ HRHPV positive: other (not 16, 18/45)  Prior cytology:  Date Result Procedure  01/20/19 NILM w/ HRHPV positive: other (not 16, 18/45) None  2017 NILM w/ HRHPV negative None          Patient's last menstrual period was 08/12/2010. Contraception: abstinence and post menopausal status. Menopausal: yes 2012 . Hysterectomy: no.   Smoker: yes. Immunocompromised: no.   The risks and benefits were explained and informed consent was obtained, and written copy is in chart. Pertinent History Reviewed:   Reviewed past medical,surgical, social, obstetrical and family history.  Reviewed problem list, medications and allergies. Objective Findings & Procedure:   Vitals:   11/15/20 1008  BP: 118/74  Pulse: 72  Weight: 170 lb (77.1 kg)  Body mass index is 26.63 kg/m.  No results found for this or any previous visit (from the past 24 hour(s)).   Time out was performed.  Speculum placed in the vagina, cervix fully visualized. SCJ: not fully visualized. Cervix swabbed x 3 with acetic acid.  Acetowhitening present: No Cervix: no visible lesions, no mosaicism, no punctation, and no abnormal vasculature. No biopsies taken and Endocervical curettage performed. Vagina: vaginal colposcopy not performed Vulva: vulvar colposcopy not performed, 1 small boil Rt labia majora- no head, not tender. Scarring c/w HS.   Specimens: 1  Complications: none  Chaperone: Alice Rieger    Colposcopic Impression & Plan:   Normal colposcopy without lesions Plan: Post biopsy instructions given, Will notify patient of results when back, and Will base plan of care on  pathology results and ASCCP guidelines  Vulvar HS> rx septra (PCN allergy)    Return in about 1 year (around 11/15/2021) for Pap & physical.  [place], [facility] 11/15/2020 10:51 AM

## 2020-11-16 ENCOUNTER — Encounter (HOSPITAL_COMMUNITY): Payer: Self-pay | Admitting: Gastroenterology

## 2020-11-16 ENCOUNTER — Encounter (INDEPENDENT_AMBULATORY_CARE_PROVIDER_SITE_OTHER): Payer: Self-pay | Admitting: *Deleted

## 2020-11-16 ENCOUNTER — Other Ambulatory Visit: Payer: Self-pay

## 2020-11-16 ENCOUNTER — Ambulatory Visit (HOSPITAL_COMMUNITY): Payer: Commercial Managed Care - PPO | Admitting: Anesthesiology

## 2020-11-16 ENCOUNTER — Ambulatory Visit (HOSPITAL_COMMUNITY)
Admission: RE | Admit: 2020-11-16 | Discharge: 2020-11-16 | Disposition: A | Discharge status: Home / Self Care | Payer: Commercial Managed Care - PPO | Attending: Gastroenterology | Admitting: Gastroenterology

## 2020-11-16 ENCOUNTER — Encounter (HOSPITAL_COMMUNITY)
Admission: RE | Disposition: A | Discharge status: Home / Self Care | Payer: Self-pay | Source: Home / Self Care | Attending: Gastroenterology

## 2020-11-16 DIAGNOSIS — Z791 Long term (current) use of non-steroidal anti-inflammatories (NSAID): Secondary | ICD-10-CM | POA: Diagnosis not present

## 2020-11-16 DIAGNOSIS — Z880 Allergy status to penicillin: Secondary | ICD-10-CM | POA: Diagnosis not present

## 2020-11-16 DIAGNOSIS — Z91048 Other nonmedicinal substance allergy status: Secondary | ICD-10-CM | POA: Diagnosis not present

## 2020-11-16 DIAGNOSIS — Z1211 Encounter for screening for malignant neoplasm of colon: Secondary | ICD-10-CM | POA: Diagnosis not present

## 2020-11-16 DIAGNOSIS — Z8572 Personal history of non-Hodgkin lymphomas: Secondary | ICD-10-CM | POA: Diagnosis not present

## 2020-11-16 DIAGNOSIS — I10 Essential (primary) hypertension: Secondary | ICD-10-CM | POA: Insufficient documentation

## 2020-11-16 DIAGNOSIS — F17210 Nicotine dependence, cigarettes, uncomplicated: Secondary | ICD-10-CM | POA: Insufficient documentation

## 2020-11-16 DIAGNOSIS — E785 Hyperlipidemia, unspecified: Secondary | ICD-10-CM | POA: Insufficient documentation

## 2020-11-16 DIAGNOSIS — K648 Other hemorrhoids: Secondary | ICD-10-CM | POA: Diagnosis not present

## 2020-11-16 DIAGNOSIS — D573 Sickle-cell trait: Secondary | ICD-10-CM | POA: Diagnosis not present

## 2020-11-16 DIAGNOSIS — Z79899 Other long term (current) drug therapy: Secondary | ICD-10-CM | POA: Diagnosis not present

## 2020-11-16 HISTORY — PX: COLONOSCOPY WITH PROPOFOL: SHX5780

## 2020-11-16 LAB — SURGICAL PATHOLOGY

## 2020-11-16 LAB — HM COLONOSCOPY

## 2020-11-16 SURGERY — COLONOSCOPY WITH PROPOFOL
Anesthesia: General

## 2020-11-16 MED ORDER — PROPOFOL 10 MG/ML IV BOLUS
INTRAVENOUS | Status: AC
  Filled 2020-11-16: qty 60

## 2020-11-16 MED ORDER — PROPOFOL 500 MG/50ML IV EMUL
INTRAVENOUS | Status: DC | PRN
  Administered 2020-11-16: 150 ug/kg/min via INTRAVENOUS

## 2020-11-16 MED ORDER — LACTATED RINGERS IV SOLN: INTRAVENOUS | Status: DC

## 2020-11-16 MED ORDER — PROPOFOL 10 MG/ML IV BOLUS
INTRAVENOUS | Status: DC | PRN
  Administered 2020-11-16: 70 mg via INTRAVENOUS
  Administered 2020-11-16: 30 mg via INTRAVENOUS

## 2020-11-16 NOTE — Discharge Instructions (Signed)
You are being discharged to home.  Resume your previous diet.  Your physician has recommended a repeat colonoscopy in 10 years for screening purposes.  

## 2020-11-16 NOTE — Anesthesia Postprocedure Evaluation (Signed)
Anesthesia Post Note  Patient: Jacquiline Purinton  Procedure(s) Performed: COLONOSCOPY WITH PROPOFOL  Patient location during evaluation: Endoscopy Anesthesia Type: General Level of consciousness: awake and alert Pain management: pain level controlled Vital Signs Assessment: post-procedure vital signs reviewed and stable Respiratory status: spontaneous breathing Cardiovascular status: blood pressure returned to baseline and stable Postop Assessment: no apparent nausea or vomiting Anesthetic complications: no   No notable events documented.   Last Vitals:  Vitals:   11/16/20 0815  BP: (!) 125/58  Pulse: 61  Resp: 19  Temp: 36.7 C  SpO2: 100%    Last Pain:  Vitals:   11/16/20 0902  TempSrc:   PainSc: 0-No pain                 Berthold Glace

## 2020-11-16 NOTE — Op Note (Signed)
[facility] Patient Name: Amber Drake Procedure Date: 11/16/2020 8:48 AM MRN: [id number] Date of Birth: 02-12-1961 Attending MD: Maylon Peppers ,  CSN: [id number] Age: 60 Admit Type: Outpatient Procedure:                Colonoscopy Indications:              Screening for colorectal malignant neoplasm Providers:                Maylon Peppers, Crystal Page, Aram Candela Referring MD:              Medicines:                Monitored Anesthesia Care Complications:            No immediate complications. Estimated Blood Loss:     Estimated blood loss: none. Procedure:                Pre-Anesthesia Assessment:                           - Prior to the procedure, a History and Physical                            was performed, and patient medications, allergies                            and sensitivities were reviewed. The patient's                            tolerance of previous anesthesia was reviewed.                           - The risks and benefits of the procedure and the                            sedation options and risks were discussed with the                            patient. All questions were answered and informed                            consent was obtained.                           - ASA Grade Assessment: II - A patient with mild                            systemic disease.                           After obtaining informed consent, the colonoscope                            was passed under direct vision. Throughout the                            procedure, the patient's blood pressure, pulse,  and                            oxygen saturations were monitored continuously. The                            [id number]-[id number]) scope was introduced through                            the anus and advanced to the the cecum, identified                            by appendiceal orifice and ileocecal valve. The                            colonoscopy was somewhat  difficult due to constant                            coughing. The patient tolerated the procedure well.                            The quality of the bowel preparation was excellent. Scope In: 9:08:33 AM Scope Out: 9:22:27 AM Scope Withdrawal Time: 0 hours 9 minutes 6 seconds  Total Procedure Duration: 0 hours 13 minutes 54 seconds  Findings:      The perianal exam findings include presence of healed perianal scar from       previous abscess drainage. Rest of DRE was normal.      The colon (entire examined portion) appeared normal.      Non-bleeding internal hemorrhoids were found during retroflexion. The       hemorrhoids were small. Impression:               - Presence of healed perianal scar from previous                            abscess drainage found on perianal exam.                           - The entire examined colon is normal.                           - Non-bleeding internal hemorrhoids.                           - No specimens collected. Moderate Sedation:      Per Anesthesia Care Recommendation:           - Discharge patient to home (ambulatory).                           - Resume previous diet.                           - Repeat colonoscopy in 10 years for screening  purposes. Procedure Code(s):        --- Professional ---                           [id number], Colorectal cancer screening; colonoscopy on                            individual not meeting criteria for high risk Diagnosis Code(s):        --- Professional ---                           Z12.11, Encounter for screening for malignant                            neoplasm of colon                           K64.8, Other hemorrhoids CPT copyright 2019 American Medical Association. All rights reserved. The codes documented in this report are preliminary and upon coder review may  be revised to meet current compliance requirements. Maylon Peppers, MD Maylon Peppers,  11/16/2020 9:27:28  AM This report has been signed electronically. Number of Addenda: 0

## 2020-11-16 NOTE — Transfer of Care (Signed)
Immediate Anesthesia Transfer of Care Note  Patient: Amber Drake  Procedure(s) Performed: COLONOSCOPY WITH PROPOFOL  Patient Location: Endoscopy Unit  Anesthesia Type:General  Level of Consciousness: awake  Airway & Oxygen Therapy: Patient Spontanous Breathing  Post-op Assessment: Report given to RN  Post vital signs: Reviewed  Last Vitals:  Vitals Value Taken Time  BP    Temp    Pulse    Resp    SpO2      Last Pain:  Vitals:   11/16/20 0902  TempSrc:   PainSc: 0-No pain      Patients Stated Pain Goal: 3 ([id number])  Complications: No notable events documented.

## 2020-11-16 NOTE — Anesthesia Preprocedure Evaluation (Signed)
Anesthesia Evaluation  Patient identified by MRN, date of birth, ID band Patient awake    Reviewed: Allergy & Precautions, NPO status , Patient's Chart, lab work & pertinent test results  Airway Mallampati: II  TM Distance: >3 FB Neck ROM: Full    Dental  (+) Dental Advisory Given, Upper Dentures, Missing   Pulmonary Current SmokerPatient did not abstain from smoking.,    Pulmonary exam normal breath sounds clear to auscultation       Cardiovascular hypertension, Pt. 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) negative psych ROS   GI/Hepatic GERD  ,  Endo/Other    Renal/GU      Musculoskeletal  (+) Arthritis  (back pain, sciatica),   Abdominal   Peds  Hematology  (+) Blood dyscrasia (B cell lymphoma), Sickle cell trait and anemia ,   Anesthesia Other Findings   Reproductive/Obstetrics                             Anesthesia Physical Anesthesia Plan  ASA: 2  Anesthesia Plan: General   Post-op Pain Management:    Induction: Intravenous  PONV Risk Score and Plan: Propofol infusion  Airway Management Planned: Nasal Cannula and Natural Airway  Additional Equipment:   Intra-op Plan:   Post-operative Plan:   Informed Consent: I have reviewed the patients History and Physical, chart, labs and discussed the procedure including the risks, benefits and alternatives for the proposed anesthesia with the patient or authorized representative who has indicated his/her understanding and acceptance.     Dental advisory given  Plan Discussed with: CRNA and Surgeon  Anesthesia Plan Comments:         Anesthesia Quick Evaluation

## 2020-11-16 NOTE — H&P (Signed)
Amber Drake is an 60 y.o. female.   Chief Complaint: screening colonoscopy HPI: 60 y/o F with PMH sickle cell trait, lymphoma, hyperlipidemia, hypertension, coming for screening colonoscopy.  Patient reported that her last colonoscopy was in 2012, states that it was normal but no reports are available.  The patient denies having any complaints such as melena, hematochezia, abdominal pain or distention, change in her bowel movement consistency or frequency, no changes in her weight recently.  No family history of colorectal cancer.   Past Medical History:  Diagnosis Date   Anemia    Arthritis    Blood dyscrasia    sickle cell trait   Cancer ([place])    lymphoma   Carbuncle and furuncle    Hypercholesterolemia    Hypertension    does not take meds   Papanicolaou smear of cervix with positive high risk human papilloma virus (HPV) test 08/23/2020   08/23/20    Colpo per ASCCP guidelines, immediate risk of CIN 3+ is 4.1 %   Sickle cell trait ([place])     Past Surgical History:  Procedure Laterality Date   APPENDECTOMY     APPLICATION OF CRANIAL NAVIGATION N/A 07/28/2018   Procedure: APPLICATION OF CRANIAL NAVIGATION;  Surgeon: Kary Kos, MD;  Location: [facility];  Service: Neurosurgery;  Laterality: N/A;   BRAIN SURGERY     CARPAL TUNNEL RELEASE  03/23/2011   Procedure: CARPAL TUNNEL RELEASE;  Surgeon: Sanjuana Kava;  Location: [facility];  Service: Orthopedics;  Laterality: Left;   CARPAL TUNNEL RELEASE  05/10/2011   Procedure: CARPAL TUNNEL RELEASE;  Surgeon: Sanjuana Kava, MD;  Location: [facility];  Service: Orthopedics;  Laterality: Right;   CESAREAN SECTION     x 2   INCISION AND DRAINAGE PERIRECTAL ABSCESS  12/21/09   PR DURAL GRAFT REPAIR,SPINE DEFECT N/A 07/28/2018   Procedure: Stereotactic open biopsy of Right cerebellar hemisphere and dura with brainlab;  Surgeon: Kary Kos, MD;  Location: [facility];  Service: Neurosurgery;  Laterality: N/A;  Stereotactic open biopsy of Right cerebellar hemisphere  and dura with brainlab   PROCTOSCOPY  10/17/2010   Procedure: PROCTOSCOPY;  Surgeon: Scherry Ran;  Location: [facility];  Service: General;  Laterality: N/A;  Rigid Proctoscopy/Possible Fistula in Ano  Procedure ended at [place] [facility]     x2    Family History  Problem Relation Age of Onset   Hypertension Mother    Hyperlipidemia Mother    Hypertension Father    Lupus Father        skin   Sarcoidosis Sister    Kidney failure Daughter    Sickle cell anemia Daughter    Sickle cell trait Daughter    Sickle cell anemia Son    Hypertension Other    Anesthesia problems Neg Hx    Hypotension Neg Hx    Malignant hyperthermia Neg Hx    Pseudochol deficiency Neg Hx    Cancer - Colon Neg Hx    Social History:  reports that she has been smoking cigarettes. She has a 15.00 pack-year smoking history. She has never used smokeless tobacco. She reports that she does not drink alcohol and does not use drugs.  Allergies:  Allergies  Allergen Reactions   Penicillins Anaphylaxis and Hives    Has patient had a PCN reaction causing immediate rash, facial/tongue/throat swelling, SOB or lightheadedness with hypotension: YES Has patient had a PCN reaction causing severe rash involving mucus membranes or skin necrosis: UNKNOWN  Has patient  had a PCN reaction that required hospitalization: NO Has patient had a PCN reaction occurring within the last 10 years: NO If all of the above answers are "NO", then may proceed with Cephalosporin use.    Penicillins Cross Reactors Itching and Swelling    PENICILLIN > ANAPHYLAXIS   Tape Itching    Medications Prior to Admission  Medication Sig Dispense Refill   atorvastatin (LIPITOR) 20 MG tablet Take 1 tablet (20 mg total) by mouth daily. 90 tablet 2   CLENPIQ 10-3.5-12 MG-GM -GM/160ML SOLN Take 320 mLs by mouth as directed.     lisinopril-hydrochlorothiazide (PRINZIDE,ZESTORETIC) 20-12.5 MG tablet Take 1 tablet by mouth daily.     Multiple  Vitamin (MULTIVITAMIN WITH MINERALS) TABS tablet Take 1 tablet by mouth daily.     traMADol (ULTRAM) 50 MG tablet Take 50 mg by mouth every 4 (four) hours as needed for moderate pain.     HYDROcodone-acetaminophen (NORCO) 7.5-325 MG tablet One tablet every six hours as needed for pain. (Patient not taking: No sig reported) 40 tablet 0   ibuprofen (ADVIL) 800 MG tablet Take 1 tablet (800 mg total) by mouth every 8 (eight) hours as needed. (Patient not taking: No sig reported) 20 tablet 1   meclizine (ANTIVERT) 12.5 MG tablet Take 1 tablet (12.5 mg total) by mouth 3 (three) times daily as needed for dizziness. (Patient not taking: No sig reported) 8 tablet 0   naproxen (NAPROSYN) 500 MG tablet Take 1 tablet (500 mg total) by mouth 2 (two) times daily with a meal. 60 tablet 5   polyethylene glycol-electrolytes (TRILYTE) 420 g solution Take 4,000 mLs by mouth as directed. (Patient not taking: Reported on 11/15/2020) 4000 mL 0   potassium chloride SA (KLOR-CON) 20 MEQ tablet TAKE (1) TABLET BY MOUTH TWICE DAILY. (Patient not taking: No sig reported) 60 tablet 1   sulfamethoxazole-trimethoprim (BACTRIM DS) 800-160 MG tablet Take 1 tablet by mouth 2 (two) times daily. 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 ENDOCERVIX, CURETTAGE: -  Scant degenerating squamous cells and benign endocervical glandular cells -  No malignancy identified   GROSS DESCRIPTION:  The specimen is received in formalin and consists of a scant amount of tan-white soft tissue.  The specimen is entirely submitted in 1 cassette.  Please note specimen may  not survive processing.  Craig Staggers 11/15/2020)    Final Diagnosis performed by Thressa Sheller, MD.   Electronically signed 11/16/2020 Technical and / or Professional components performed at [facility]. [facility], [place] [address], [place], [place] [address].  Immunohistochemistry Technical component (if applicable) was performed at [facility]. [address] [place], [place], [place] [address].   IMMUNOHISTOCHEMISTRY DISCLAIMER (if applicable): Some  of these immunohistochemical stains may have been developed and the performance characteristics determine by [facility]. Some may not have been cleared or approved by the U.S. Food and Drug Administration. The FDA has determined that such clearance or approval is not necessary. This test is used for clinical purposes. It should not be regarded as investigational or  for research. This laboratory is certified under the [place] (CLIA-88) as qualified to perform high complexity clinical laboratory testing.  The controls stained appropriately.    No results found.  Review of Systems  Constitutional: Negative.   HENT: Negative.    Eyes: Negative.   Respiratory: Negative.    Cardiovascular: Negative.   Gastrointestinal: Negative.   Endocrine: Negative.   Genitourinary: Negative.   Musculoskeletal: Negative.   Allergic/Immunologic: Negative.   Neurological: Negative.   Hematological: Negative.   Psychiatric/Behavioral: Negative.     Blood pressure (!) 125/58, pulse 61, temperature 98 F (36.7 C), temperature source Oral, resp. rate 19, height '5\' 4"'$  (1.626 m), weight 77.1 kg, last menstrual period 08/12/2010, SpO2 100 %. Physical Exam  GENERAL: The patient is AO x3, in no acute distress. HEENT: Head is normocephalic and atraumatic. EOMI are intact. Mouth is well hydrated and without lesions. NECK: Supple. No masses LUNGS: Clear to auscultation. No presence of  rhonchi/wheezing/rales. Adequate chest expansion HEART: RRR, normal s1 and s2. ABDOMEN: Soft, nontender, no guarding, no peritoneal signs, and nondistended. BS +. No masses. EXTREMITIES: Without any cyanosis, clubbing, rash, lesions or edema. NEUROLOGIC: AOx3, no focal motor deficit. SKIN: no jaundice, no rashes  Assessment/Plan 60 y/o F with PMH sickle cell trait, lymphoma, hyperlipidemia, hypertension, coming for screening colonoscopy. The patient is at average risk for colorectal cancer.  We will proceed with colonoscopy today.   Harvel Quale, MD 11/16/2020, 8:48 AM

## 2020-11-18 ENCOUNTER — Telehealth: Payer: Self-pay

## 2020-11-18 NOTE — Telephone Encounter (Signed)
-----   Message from Roma Schanz, [place] sent at 11/18/2020  9:17 AM EDT ----- Hasn't read [employer]. Please call and read it to her/have her read it. Thanks

## 2020-11-18 NOTE — Telephone Encounter (Signed)
Called pt to relay test results and Mychart msg per Knute Neu. Pt confirmed understanding.

## 2020-11-25 ENCOUNTER — Encounter (HOSPITAL_COMMUNITY): Payer: Self-pay | Admitting: Gastroenterology

## 2020-11-25 NOTE — Pre-Procedure Instructions (Signed)
Surgical Instructions    Your procedure is scheduled on Monday, September 12th.  Report to [facility] Main Entrance "A" at 07:40 A.M., then check in with the Admitting office.  Call this number if you have problems the morning of surgery:  [phone]   If you have any questions prior to your surgery date call [phone]: Open Monday-Friday 8am-4pm.    Remember:  Do not eat or drink after midnight the night before your surgery.    Take these medicines the morning of surgery with A SIP OF WATER: atorvastatin (LIPITOR)   IF NEEDED: oxyCODONE (OXY IR/ROXICODONE) traMADol (ULTRAM)   7 days prior to surgery STOP taking any Aspirin (unless otherwise instructed by your surgeon), Aleve, Naproxen, Ibuprofen, Motrin, Advil, Goody's, BC's, all herbal medications, fish oil, and all vitamins.   Do not wear jewelry or makeup. Do not wear lotions, powders, perfumes, or deodorant. Do not shave 48 hours prior to surgery.   Do not bring valuables to the hospital. DO Not wear nail polish, gel polish, artificial nails, or any other type of covering on natural nails including finger and toenails. If patients have artificial nails, gel coating, etc. that need to be removed by a nail salon please have this removed prior to surgery or surgery may need to be canceled/delayed if the surgeon/ anesthesia feels like the patient is unable to be adequately monitored.             [facility] is not responsible for any belongings or valuables.  Do NOT Smoke (Tobacco/Vaping) or drink Alcohol 24 hours prior to your procedure. If you use a CPAP at night, you may bring all equipment for your overnight stay.   Contacts, glasses, dentures or bridgework may not be worn into surgery, please bring cases for these belongings.   For patients admitted to the hospital, discharge time will be determined by your treatment team.   Patients discharged the day of surgery will not be allowed to drive home, and someone  needs to stay with them for 24 hours.  ONLY 1 SUPPORT PERSON MAY BE PRESENT WHILE YOU ARE IN SURGERY. IF YOU ARE TO BE ADMITTED ONCE YOU ARE IN YOUR ROOM YOU WILL BE ALLOWED TWO (2) VISITORS.  Minor children may have two parents present. Special consideration for safety and communication needs will be reviewed on a case by case basis.  Special instructions:    Oral Hygiene is also important to reduce your risk of infection.  Remember - BRUSH YOUR TEETH THE MORNING OF SURGERY WITH YOUR REGULAR TOOTHPASTE   [facility]- Preparing For Surgery  Before surgery, you can play an important role. Because skin is not sterile, your skin needs to be as free of germs as possible. You can reduce the number of germs on your skin by washing with CHG (chlorahexidine gluconate) Soap before surgery.  CHG is an antiseptic cleaner which kills germs and bonds with the skin to continue killing germs even after washing.     Please do not use if you have an allergy to CHG or antibacterial soaps. If your skin becomes reddened/irritated stop using the CHG.  Do not shave (including legs and underarms) for at least 48 hours prior to first CHG shower. It is OK to shave your face.  Please follow these instructions carefully.     Shower the NIGHT BEFORE SURGERY and the MORNING OF SURGERY with CHG Soap.   If you chose to wash your hair, wash your hair first as usual  with your normal shampoo. After you shampoo, rinse your hair and body thoroughly to remove the shampoo.  Then [employer] and genitals (private parts) with your normal soap and rinse thoroughly to remove soap.  After that Use CHG Soap as you would any other liquid soap. You can apply CHG directly to the skin and wash gently with a scrungie or a clean washcloth.   Apply the CHG Soap to your body ONLY FROM THE NECK DOWN.  Do not use on open wounds or open sores. Avoid contact with your eyes, ears, mouth and genitals (private parts). Wash Face and genitals (private  parts)  with your normal soap.   Wash thoroughly, paying special attention to the area where your surgery will be performed.  Thoroughly rinse your body with warm water from the neck down.  DO NOT shower/wash with your normal soap after using and rinsing off the CHG Soap.  Pat yourself dry with a CLEAN TOWEL.  Wear CLEAN PAJAMAS to bed the night before surgery  Place CLEAN SHEETS on your bed the night before your surgery  DO NOT SLEEP WITH PETS.   Day of Surgery:  Take a shower with CHG soap. Wear Clean/Comfortable clothing the morning of surgery Do not apply any deodorants/lotions.   Remember to brush your teeth WITH YOUR REGULAR TOOTHPASTE.   Please read over the following fact sheets that you were given.

## 2020-11-28 ENCOUNTER — Other Ambulatory Visit: Payer: Self-pay

## 2020-11-28 ENCOUNTER — Encounter (HOSPITAL_COMMUNITY)
Admission: RE | Admit: 2020-11-28 | Discharge: 2020-11-28 | Disposition: A | Discharge status: Home / Self Care | Payer: Commercial Managed Care - PPO | Source: Ambulatory Visit | Attending: Neurosurgery | Admitting: Neurosurgery

## 2020-11-28 ENCOUNTER — Encounter (HOSPITAL_COMMUNITY): Payer: Self-pay

## 2020-11-28 DIAGNOSIS — Z01818 Encounter for other preprocedural examination: Secondary | ICD-10-CM | POA: Diagnosis present

## 2020-11-28 LAB — BASIC METABOLIC PANEL
Anion gap: 7 (ref 5–15)
BUN: 14 mg/dL (ref 6–20)
CO2: 27 mmol/L (ref 22–32)
Calcium: 9.8 mg/dL (ref 8.9–10.3)
Chloride: 103 mmol/L (ref 98–111)
Creatinine, Ser: 0.74 mg/dL (ref 0.44–1.00)
GFR, Estimated: >60 mL/min (ref >60)
Glucose, Bld: 85 mg/dL (ref 70–99)
Potassium: 3.4 mmol/L — ABNORMAL LOW (ref 3.5–5.1)
Sodium: 137 mmol/L (ref 135–145)

## 2020-11-28 LAB — CBC
HCT: 36.9 % (ref 36.0–46.0)
Hemoglobin: 12.1 g/dL (ref 12.0–15.0)
MCH: 28.4 pg (ref 26.0–34.0)
MCHC: 32.8 g/dL (ref 30.0–36.0)
MCV: 86.6 fL (ref 80.0–100.0)
Platelets: 390 10*3/uL (ref 150–400)
RBC: 4.26 MIL/uL (ref 3.87–5.11)
RDW: 12.7 % (ref 11.5–15.5)
WBC: 6.4 10*3/uL (ref 4.0–10.5)
nRBC: 0 % (ref 0.0–0.2)

## 2020-11-28 LAB — SURGICAL PCR SCREEN
MRSA, PCR: NEGATIVE
Staphylococcus aureus: NEGATIVE

## 2020-11-28 NOTE — Progress Notes (Signed)
PCP - [name] Cardiologist - denies  Chest x-ray - n/a EKG - 11/28/20  COVID TEST- 12/09/20 (given instructions)   Anesthesia review: n/a  Patient denies shortness of breath, fever, cough and chest pain at PAT appointment   All instructions explained to the patient, with a verbal understanding of the material. Patient agrees to go over the instructions while at home for a better understanding. Patient also instructed to self quarantine after being tested for COVID-19. The opportunity to ask questions was provided.

## 2020-11-29 ENCOUNTER — Inpatient Hospital Stay: Payer: Commercial Managed Care - PPO | Attending: Hematology | Admitting: Hematology

## 2020-11-29 ENCOUNTER — Inpatient Hospital Stay: Payer: Commercial Managed Care - PPO

## 2020-11-29 ENCOUNTER — Telehealth: Payer: Self-pay | Admitting: Hematology

## 2020-11-29 VITALS — BP 134/63 | HR 81 | Temp 98.4°F | Resp 18 | Wt 173.9 lb

## 2020-11-29 VITALS — BP 112/58 | HR 66 | Temp 98.1°F | Resp 17

## 2020-11-29 DIAGNOSIS — Z7952 Long term (current) use of systemic steroids: Secondary | ICD-10-CM | POA: Insufficient documentation

## 2020-11-29 DIAGNOSIS — Z79899 Other long term (current) drug therapy: Secondary | ICD-10-CM | POA: Diagnosis not present

## 2020-11-29 DIAGNOSIS — E7800 Pure hypercholesterolemia, unspecified: Secondary | ICD-10-CM | POA: Diagnosis not present

## 2020-11-29 DIAGNOSIS — Z5112 Encounter for antineoplastic immunotherapy: Secondary | ICD-10-CM | POA: Diagnosis present

## 2020-11-29 DIAGNOSIS — C884 Extranodal marginal zone B-cell lymphoma of mucosa-associated lymphoid tissue [MALT-lymphoma]: Secondary | ICD-10-CM | POA: Diagnosis not present

## 2020-11-29 DIAGNOSIS — Z7189 Other specified counseling: Secondary | ICD-10-CM

## 2020-11-29 DIAGNOSIS — I10 Essential (primary) hypertension: Secondary | ICD-10-CM | POA: Diagnosis not present

## 2020-11-29 DIAGNOSIS — D573 Sickle-cell trait: Secondary | ICD-10-CM | POA: Diagnosis not present

## 2020-11-29 LAB — CBC WITH DIFFERENTIAL/PLATELET
Abs Immature Granulocytes: 0.01 10*3/uL (ref 0.00–0.07)
Basophils Absolute: 0 10*3/uL (ref 0.0–0.1)
Basophils Relative: 0 %
Eosinophils Absolute: 0.1 10*3/uL (ref 0.0–0.5)
Eosinophils Relative: 1 %
HCT: 35.3 % — ABNORMAL LOW (ref 36.0–46.0)
Hemoglobin: 12 g/dL (ref 12.0–15.0)
Immature Granulocytes: 0 %
Lymphocytes Relative: 34 %
Lymphs Abs: 2.4 10*3/uL (ref 0.7–4.0)
MCH: 28.8 pg (ref 26.0–34.0)
MCHC: 34 g/dL (ref 30.0–36.0)
MCV: 84.9 fL (ref 80.0–100.0)
Monocytes Absolute: 0.5 10*3/uL (ref 0.1–1.0)
Monocytes Relative: 7 %
Neutro Abs: 4.2 10*3/uL (ref 1.7–7.7)
Neutrophils Relative %: 58 %
Platelets: 349 10*3/uL (ref 150–400)
RBC: 4.16 MIL/uL (ref 3.87–5.11)
RDW: 12.4 % (ref 11.5–15.5)
WBC: 7.3 10*3/uL (ref 4.0–10.5)
nRBC: 0 % (ref 0.0–0.2)

## 2020-11-29 LAB — CMP (CANCER CENTER ONLY)
ALT: 18 U/L (ref 0–44)
AST: 22 U/L (ref 15–41)
Albumin: 4.1 g/dL (ref 3.5–5.0)
Alkaline Phosphatase: 74 U/L (ref 38–126)
Anion gap: 11 (ref 5–15)
BUN: 17 mg/dL (ref 6–20)
CO2: 25 mmol/L (ref 22–32)
Calcium: 9.7 mg/dL (ref 8.9–10.3)
Chloride: 104 mmol/L (ref 98–111)
Creatinine: 0.81 mg/dL (ref 0.44–1.00)
GFR, Estimated: >60 mL/min (ref >60)
Glucose, Bld: 89 mg/dL (ref 70–99)
Potassium: 3.5 mmol/L (ref 3.5–5.1)
Sodium: 140 mmol/L (ref 135–145)
Total Bilirubin: 0.3 mg/dL (ref 0.3–1.2)
Total Protein: 7 g/dL (ref 6.5–8.1)

## 2020-11-29 LAB — LACTATE DEHYDROGENASE: LDH: 185 U/L (ref 98–192)

## 2020-11-29 MED ORDER — SODIUM CHLORIDE 0.9 % IV SOLN
10.0000 mg | Freq: Once | INTRAVENOUS | Status: AC
  Administered 2020-11-29: 10 mg via INTRAVENOUS
  Filled 2020-11-29: qty 10

## 2020-11-29 MED ORDER — ACETAMINOPHEN 325 MG PO TABS
650.0000 mg | ORAL_TABLET | Freq: Once | ORAL | Status: AC
Start: 2020-11-29 — End: 2020-11-29
  Administered 2020-11-29: 650 mg via ORAL
  Filled 2020-11-29: qty 2

## 2020-11-29 MED ORDER — SODIUM CHLORIDE 0.9 % IV SOLN
375.0000 mg/m2 | Freq: Once | INTRAVENOUS | Status: AC
  Administered 2020-11-29: 700 mg via INTRAVENOUS
  Filled 2020-11-29: qty 50

## 2020-11-29 MED ORDER — FAMOTIDINE 20 MG IN NS 100 ML IVPB
20.0000 mg | Freq: Once | INTRAVENOUS | Status: AC
  Administered 2020-11-29: 20 mg via INTRAVENOUS
  Filled 2020-11-29: qty 100

## 2020-11-29 MED ORDER — SODIUM CHLORIDE 0.9 % IV SOLN: Freq: Once | INTRAVENOUS | Status: AC

## 2020-11-29 MED ORDER — DIPHENHYDRAMINE HCL 25 MG PO CAPS
50.0000 mg | ORAL_CAPSULE | Freq: Once | ORAL | Status: AC
  Administered 2020-11-29: 50 mg via ORAL
  Filled 2020-11-29: qty 2

## 2020-11-29 NOTE — Telephone Encounter (Signed)
Sch per 8/30 los, pt aware

## 2020-11-29 NOTE — Patient Instructions (Signed)
[facility]  Discharge Instructions: Thank you for choosing [facility] to provide your oncology and hematology care.   If you have a lab appointment with the [facility], please go directly to the [facility] and check in at the registration area.   Wear comfortable clothing and clothing appropriate for easy access to any Portacath or PICC line.   We strive to give you quality time with your provider. You may need to reschedule your appointment if you arrive late (15 or more minutes).  Arriving late affects you and other patients whose appointments are after yours.  Also, if you miss three or more appointments without notifying the office, you may be dismissed from the clinic at the provider's discretion.      For prescription refill requests, have your pharmacy contact our office and allow 72 hours for refills to be completed.    Today you received the following chemotherapy and/or immunotherapy agents: rituximab-pvvr      To help prevent nausea and vomiting after your treatment, we encourage you to take your nausea medication as directed.  BELOW ARE SYMPTOMS THAT SHOULD BE REPORTED IMMEDIATELY: *FEVER GREATER THAN 100.4 F (38 C) OR HIGHER *CHILLS OR SWEATING *NAUSEA AND VOMITING THAT IS NOT CONTROLLED WITH YOUR NAUSEA MEDICATION *UNUSUAL SHORTNESS OF BREATH *UNUSUAL BRUISING OR BLEEDING *URINARY PROBLEMS (pain or burning when urinating, or frequent urination) *BOWEL PROBLEMS (unusual diarrhea, constipation, pain near the anus) TENDERNESS IN MOUTH AND THROAT WITH OR WITHOUT PRESENCE OF ULCERS (sore throat, sores in mouth, or a toothache) UNUSUAL RASH, SWELLING OR PAIN  UNUSUAL VAGINAL DISCHARGE OR ITCHING   Items with * indicate a potential emergency and should be followed up as soon as possible or go to the Emergency Department if any problems should occur.  Please show the CHEMOTHERAPY ALERT CARD or IMMUNOTHERAPY ALERT CARD at  check-in to the Emergency Department and triage nurse.  Should you have questions after your visit or need to cancel or reschedule your appointment, please contact [facility]  Dept: [phone]  and follow the prompts.  Office hours are 8:00 a.m. to 4:30 p.m. Monday - Friday. Please note that voicemails left after 4:00 p.m. may not be returned until the following business day.  We are closed weekends and major holidays. You have access to a nurse at all times for urgent questions. Please call the main number to the clinic Dept: [phone] and follow the prompts.   For any non-urgent questions, you may also contact your provider using MyChart. We now offer e-Visits for anyone 7 and older to request care online for non-urgent symptoms. For details visit [email or web address].   Also download the MyChart app! Go to the app store, search "MyChart", open the app, select [facility], and log in with your MyChart username and password.  Due to Covid, a mask is required upon entering the hospital/clinic. If you do not have a mask, one will be given to you upon arrival. For doctor visits, patients may have 1 support person aged 49 or older with them. For treatment visits, patients cannot have anyone with them due to current Covid guidelines and our immunocompromised population.

## 2020-11-30 ENCOUNTER — Other Ambulatory Visit: Payer: Self-pay

## 2020-11-30 ENCOUNTER — Inpatient Hospital Stay: Payer: Commercial Managed Care - PPO

## 2020-11-30 DIAGNOSIS — C884 Extranodal marginal zone B-cell lymphoma of mucosa-associated lymphoid tissue [MALT-lymphoma]: Secondary | ICD-10-CM

## 2020-11-30 MED ORDER — TIXAGEVIMAB (PART OF EVUSHELD) INJECTION
300.0000 mg | Freq: Once | INTRAMUSCULAR | Status: AC
  Administered 2020-11-30: 300 mg via INTRAMUSCULAR
  Filled 2020-11-30: qty 3

## 2020-11-30 MED ORDER — CILGAVIMAB (PART OF EVUSHELD) INJECTION
300.0000 mg | Freq: Once | INTRAMUSCULAR | Status: AC
  Administered 2020-11-30: 300 mg via INTRAMUSCULAR
  Filled 2020-11-30: qty 3

## 2020-11-30 NOTE — Progress Notes (Signed)
Patient monitored for 30-minutes following Evusheld injection. Vital signs retaken and patient stable upon discharge.

## 2020-12-05 ENCOUNTER — Encounter: Payer: Self-pay | Admitting: Hematology

## 2020-12-05 NOTE — Progress Notes (Signed)
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 female returns today for management and evaluation of her CNS Extranodal Marginal Zone Lymphoma. The patient's last visit with [place] was about 2 months ago.. The pt reports that she is doing fairly well overall.   Patient notes no acute issues since her last clinic visit. No infection issues, no fevers/chills/night sweats or unexpected weight loss. No headaches or neck pain. She is scheduled for a left L5-S1 laminectomy with Dr Saintclair Halsted on 9/12 Recent colonoscopy done on 11/16/2020. Has been very busy at work. Labs reviewed today.  No reported toxicities from previous Rituxan treatment.   MEDICAL HISTORY: Past Medical History:  Diagnosis Date   Anemia    Arthritis    Blood dyscrasia    sickle cell trait   Cancer ([place])    lymphoma   Carbuncle and furuncle    Hypercholesterolemia    Hypertension    does not take meds   Papanicolaou smear of cervix with positive high risk human papilloma virus (HPV) test 08/23/2020   08/23/20    [place] per ASCCP guidelines, immediate risk of CIN 3+ is 4.1 %   Sickle cell trait ([facility])     ALLERGIES:  is allergic to penicillins, penicillins cross reactors, and tape.  MEDICATIONS:  Current Outpatient Medications  Medication Sig Dispense Refill   atorvastatin (LIPITOR) 20 MG tablet Take 1 tablet (20 mg total) by mouth daily. 90 tablet 2   ibuprofen (ADVIL) 800 MG tablet Take 1 tablet (800 mg total) by mouth every 8 (eight) hours as needed. 20 tablet 1   lisinopril-hydrochlorothiazide (PRINZIDE,ZESTORETIC) 20-12.5 MG tablet Take 1 tablet by mouth daily.     meclizine (ANTIVERT) 12.5 MG tablet Take 1 tablet (12.5 mg total) by mouth 3 (three) times daily as needed for dizziness. (Patient not  taking: No sig reported) 8 tablet 0   Multiple Vitamin (MULTIVITAMIN WITH MINERALS) TABS tablet Take 1 tablet by mouth daily.     naproxen (NAPROSYN) 500 MG tablet Take 1 tablet (500 mg total) by mouth 2 (two) times daily with a meal. (Patient not taking: Reported on 11/23/2020) 60 tablet 5   oxyCODONE (OXY IR/ROXICODONE) 5 MG immediate release tablet Take 5 mg by mouth every 6 (six) hours as needed for pain.     potassium chloride SA (KLOR-CON) 20 MEQ tablet TAKE (1) TABLET BY MOUTH TWICE DAILY. (Patient not taking: No sig reported) 60 tablet 1   sulfamethoxazole-trimethoprim (BACTRIM DS) 800-160 MG tablet Take 1 tablet by mouth 2 (two) times daily. X 7 days 14 tablet 0   traMADol (ULTRAM) 50 MG tablet Take 50 mg by mouth every 4 (four) hours as needed for moderate pain.     No current facility-administered medications for this visit.   Facility-Administered Medications Ordered in Other Visits  Medication Dose Route Frequency Provider Last Rate Last Admin   acetaminophen (TYLENOL) tablet 500 mg  500 mg Oral Once Brunetta Genera, MD       And   oxyCODONE (Oxy IR/ROXICODONE) immediate release tablet 5 mg  5 mg Oral Once Brunetta Genera, MD       dexamethasone (DECADRON) 10 mg in sodium chloride 0.9 % 50 mL IVPB  10 mg Intravenous Once Brunetta Genera, MD        SURGICAL HISTORY:  Past Surgical History:  Procedure Laterality Date   APPENDECTOMY     APPLICATION OF CRANIAL NAVIGATION N/A 07/28/2018   Procedure: APPLICATION OF CRANIAL NAVIGATION;  Surgeon: Kary Kos, MD;  Location: [facility];  Service: Neurosurgery;  Laterality: N/A;   BRAIN SURGERY     CARPAL TUNNEL RELEASE  03/23/2011   Procedure: CARPAL TUNNEL RELEASE;  Surgeon: Sanjuana Kava;  [facility];  Service: Orthopedics;  Laterality: Left;   CARPAL TUNNEL RELEASE  05/10/2011   Procedure: CARPAL TUNNEL RELEASE;  Surgeon: Sanjuana Kava, MD;  Location[facility];  Service: Orthopedics;  Laterality: Right;   CESAREAN SECTION      x 2   COLONOSCOPY WITH PROPOFOL N/A 11/16/2020   Procedure: COLONOSCOPY WITH PROPOFOL;  Surgeon: Harvel Quale, MD;  [facility];  Service: Gastroenterology;  Laterality: N/A;  9:15   INCISION AND DRAINAGE PERIRECTAL ABSCESS  12/21/09   PR DURAL GRAFT REPAIR,SPINE DEFECT N/A 07/28/2018   Procedure: Stereotactic open biopsy of Right cerebellar hemisphere and dura with brainlab;  Surgeon: Kary Kos, MD;  Location: [facility];  Service: Neurosurgery;  Laterality: N/A;  Stereotactic open biopsy of Right cerebellar hemisphere and dura with brainlab   PROCTOSCOPY  10/17/2010   Procedure: PROCTOSCOPY;  Surgeon: Scherry Ran;  [facility]: [facility];  Service: General;  Laterality: N/A;  Rigid Proctoscopy/Possible Fistula in Ano  Procedure ended at [facility]     x2    REVIEW OF SYSTEMS:   .10 Point review of Systems was done is negative except as noted above.  PHYSICAL EXAMINATION:  Blood pressure 134/63, pulse 81, temperature 98.4 F (36.9 C), temperature source Oral, resp. rate 18, weight 173 lb 14.4 oz (78.9 kg), last menstrual period 08/12/2010, SpO2 100 %.  ECOG PERFORMANCE STATUS: 0-1  GENERAL:alert, in no acute distress and comfortable SKIN: no acute rashes, no significant lesions EYES: conjunctiva are pink and non-injected, sclera anicteric OROPHARYNX: MMM, no exudates, no oropharyngeal erythema or ulceration NECK: supple, no JVD LYMPH:  no palpable lymphadenopathy in the cervical, axillary or inguinal regions LUNGS: clear to auscultation b/l with normal respiratory effort HEART: regular rate & rhythm ABDOMEN:  normoactive bowel sounds , non tender, not distended. Extremity: no pedal edema PSYCH: alert & oriented x 3 with fluent speech NEURO: no focal motor/sensory deficits    LABORATORY DATA:  . CBC Latest Ref Rng & Units 11/29/2020 11/28/2020 09/30/2020  WBC 4.0 - 10.5 K/uL 7.3 6.4 6.7  Hemoglobin 12.0 - 15.0 g/dL 12.0 12.1 12.7   Hematocrit 36.0 - 46.0 % 35.3(L) 36.9 37.1  Platelets 150 - 400 K/uL 349 390 375    . CMP Latest Ref Rng & Units 11/29/2020 11/28/2020 09/30/2020  Glucose 70 - 99 mg/dL 89 85 98  BUN 6 - 20 mg/dL '17 14 8  '$ Creatinine 0.44 - 1.00 mg/dL 0.81 0.74 0.78  Sodium 135 - 145 mmol/L 140 137 143  Potassium 3.5 - 5.1 mmol/L 3.5 3.4(L) 3.5  Chloride 98 - 111 mmol/L 104 103 105  CO2 22 - 32 mmol/L '25 27 28  '$ Calcium 8.9 - 10.3 mg/dL 9.7 9.8 9.8  Total Protein 6.5 - 8.1 g/dL 7.0 - 6.7  Total Bilirubin 0.3 - 1.2 mg/dL 0.3 - 0.2(L)  Alkaline Phos 38 - 126 U/L 74 - 75  AST 15 - 41 U/L 22 - 13(L)  ALT 0 - 44 U/L 18 - 12    . Lab Results  Component Value Date   LDH 185 11/29/2020  RADIOGRAPHIC STUDIES:  No results found.   ASSESSMENT/PLAN:  60 y.o. female with   1. 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 The process appears to extend into the upper cervical canal. Mild obstructive hydrocephalus. Differential includes tumor including metastatic disease and lymphoma. Chronic inflammatory process such as sarcoid is a consideration however chest x-ray negative. TB and atypical infection/fungus also possible. 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 2. Nonspecific focus of increased uptake  within the cord extending from T12 to L1. Cannot rule out additional site of CNS lymphoma. Consider further evaluation with contrast enhanced MRI through this area. 3. Aortic Atherosclerosis and Emphysema."   10/16/2018 MRI brain w and w/o contrast revealed "1. Resolved dural mass in the right posterior fossa with minimal smooth dural thickening that may be treatment related. Cerebellar edema and mass effect is also resolved. No new site of disease. 2. 6 mm colloid cyst."   10/20/2018 MRI cervical spine w and w/o contrast revealed "Negative for lymphoma.  No acute abnormality. Central disc protrusion at C5-6 effaces the ventral thecal sac causing mild central canal narrowing. There is also a shallow disc bulge at C6-7 which narrows but does not efface the ventral thecal Sac."   01/13/2019 MRI Brain ([id number]) revealed "1. Stable and satisfactory post treatment appearance of the posterior fossa. 2. Stable small 6 mm colloid cyst. 3. No new intracranial abnormality."   05/06/2019 MRI Cervical Spine ([id number]) revealed "No evidence of lymphoma in the cervical spine. Previously noted dural enhancement in the posterior fossa and cervical canal has resolved. No cord compression or cord lesion. Chronic cervical spine degenerative changes are stable from the prior study."   12/15/2019 MRI Brain ([id number]) revealed "No acute intracranial abnormality. No enhancing mass lesion 5 mm colloid cyst in the third ventricle is chronic and unchanged from prior studies."  08/14/2020, MR Brain ([id number]) which revealed "1. Unchanged appearance of the brain. No evidence of recurrent intracranial lymphoma. 2. Small colloid cyst without hydrocephalus."   PLAN: -patients labs reviewed with her today. Cbc, cmp and ldh stable. - no lab or clinic evidence of lymphoma progression at this time. --Will continue maintenance Rituxan q8weeks for up to three years. The pt has no prohibitive toxicities. -Continue to avoid  Ibuprofen, use OTC Tylenol for pain management. -Advised pt we continue to monitor labs and symptoms for progression of disease. -Will see back as scheduled with her next cycle of Rituxan in 2 months. -patient okay to proceed for her L5S1 decompression surgery with Dr Saintclair Halsted as planned from lymphoma standpoint.       FOLLOW UP: [place] today Please schedule next 3 cycles of maintenance Rituxan every 2 months with labs and MD visits  . The total time spent in the appointment was 30 minutes and more than 50% was on counseling and direct patient cares, ordering and mx of Rituxan  treatment.  Brunetta Genera MD

## 2020-12-08 ENCOUNTER — Other Ambulatory Visit: Payer: Self-pay | Admitting: Neurosurgery

## 2020-12-08 LAB — SARS CORONAVIRUS 2 (TAT 6-24 HRS): SARS Coronavirus 2: NEGATIVE

## 2020-12-12 ENCOUNTER — Ambulatory Visit (HOSPITAL_COMMUNITY): Payer: Commercial Managed Care - PPO | Admitting: Certified Registered Nurse Anesthetist

## 2020-12-12 ENCOUNTER — Ambulatory Visit (HOSPITAL_COMMUNITY): Payer: Commercial Managed Care - PPO

## 2020-12-12 ENCOUNTER — Encounter (HOSPITAL_COMMUNITY): Payer: Self-pay | Admitting: Neurosurgery

## 2020-12-12 ENCOUNTER — Ambulatory Visit (HOSPITAL_COMMUNITY)
Admission: RE | Admit: 2020-12-12 | Discharge: 2020-12-12 | Disposition: A | Discharge status: Home / Self Care | Payer: Commercial Managed Care - PPO | Attending: Neurosurgery | Admitting: Neurosurgery

## 2020-12-12 ENCOUNTER — Encounter (HOSPITAL_COMMUNITY)
Admission: RE | Disposition: A | Discharge status: Home / Self Care | Payer: Self-pay | Source: Home / Self Care | Attending: Neurosurgery

## 2020-12-12 ENCOUNTER — Other Ambulatory Visit: Payer: Self-pay

## 2020-12-12 DIAGNOSIS — M5418 Radiculopathy, sacral and sacrococcygeal region: Secondary | ICD-10-CM | POA: Diagnosis not present

## 2020-12-12 DIAGNOSIS — Z841 Family history of disorders of kidney and ureter: Secondary | ICD-10-CM | POA: Insufficient documentation

## 2020-12-12 DIAGNOSIS — Z8349 Family history of other endocrine, nutritional and metabolic diseases: Secondary | ICD-10-CM | POA: Insufficient documentation

## 2020-12-12 DIAGNOSIS — M4807 Spinal stenosis, lumbosacral region: Secondary | ICD-10-CM | POA: Insufficient documentation

## 2020-12-12 DIAGNOSIS — Z8249 Family history of ischemic heart disease and other diseases of the circulatory system: Secondary | ICD-10-CM | POA: Insufficient documentation

## 2020-12-12 DIAGNOSIS — D573 Sickle-cell trait: Secondary | ICD-10-CM | POA: Diagnosis not present

## 2020-12-12 DIAGNOSIS — Z880 Allergy status to penicillin: Secondary | ICD-10-CM | POA: Diagnosis not present

## 2020-12-12 DIAGNOSIS — Z8489 Family history of other specified conditions: Secondary | ICD-10-CM | POA: Diagnosis not present

## 2020-12-12 DIAGNOSIS — Z888 Allergy status to other drugs, medicaments and biological substances status: Secondary | ICD-10-CM | POA: Diagnosis not present

## 2020-12-12 DIAGNOSIS — Z419 Encounter for procedure for purposes other than remedying health state, unspecified: Secondary | ICD-10-CM

## 2020-12-12 DIAGNOSIS — F17210 Nicotine dependence, cigarettes, uncomplicated: Secondary | ICD-10-CM | POA: Insufficient documentation

## 2020-12-12 DIAGNOSIS — Z79899 Other long term (current) drug therapy: Secondary | ICD-10-CM | POA: Insufficient documentation

## 2020-12-12 DIAGNOSIS — Z832 Family history of diseases of the blood and blood-forming organs and certain disorders involving the immune mechanism: Secondary | ICD-10-CM | POA: Insufficient documentation

## 2020-12-12 DIAGNOSIS — Z791 Long term (current) use of non-steroidal anti-inflammatories (NSAID): Secondary | ICD-10-CM | POA: Insufficient documentation

## 2020-12-12 DIAGNOSIS — M48061 Spinal stenosis, lumbar region without neurogenic claudication: Secondary | ICD-10-CM | POA: Diagnosis present

## 2020-12-12 DIAGNOSIS — Z8572 Personal history of non-Hodgkin lymphomas: Secondary | ICD-10-CM | POA: Diagnosis not present

## 2020-12-12 HISTORY — PX: LUMBAR LAMINECTOMY/DECOMPRESSION MICRODISCECTOMY: SHX5026

## 2020-12-12 IMAGING — CR DG LUMBAR SPINE 2-3V
2 series · 2 of 2 positions shown · non-contrast
Comparison: [DATE], MRI [DATE]

CLINICAL DATA: Intraoperative localization, lumbar laminectomy

EXAM:
LUMBAR SPINE - 2-3 VIEW

[lateral (1 of 2)]
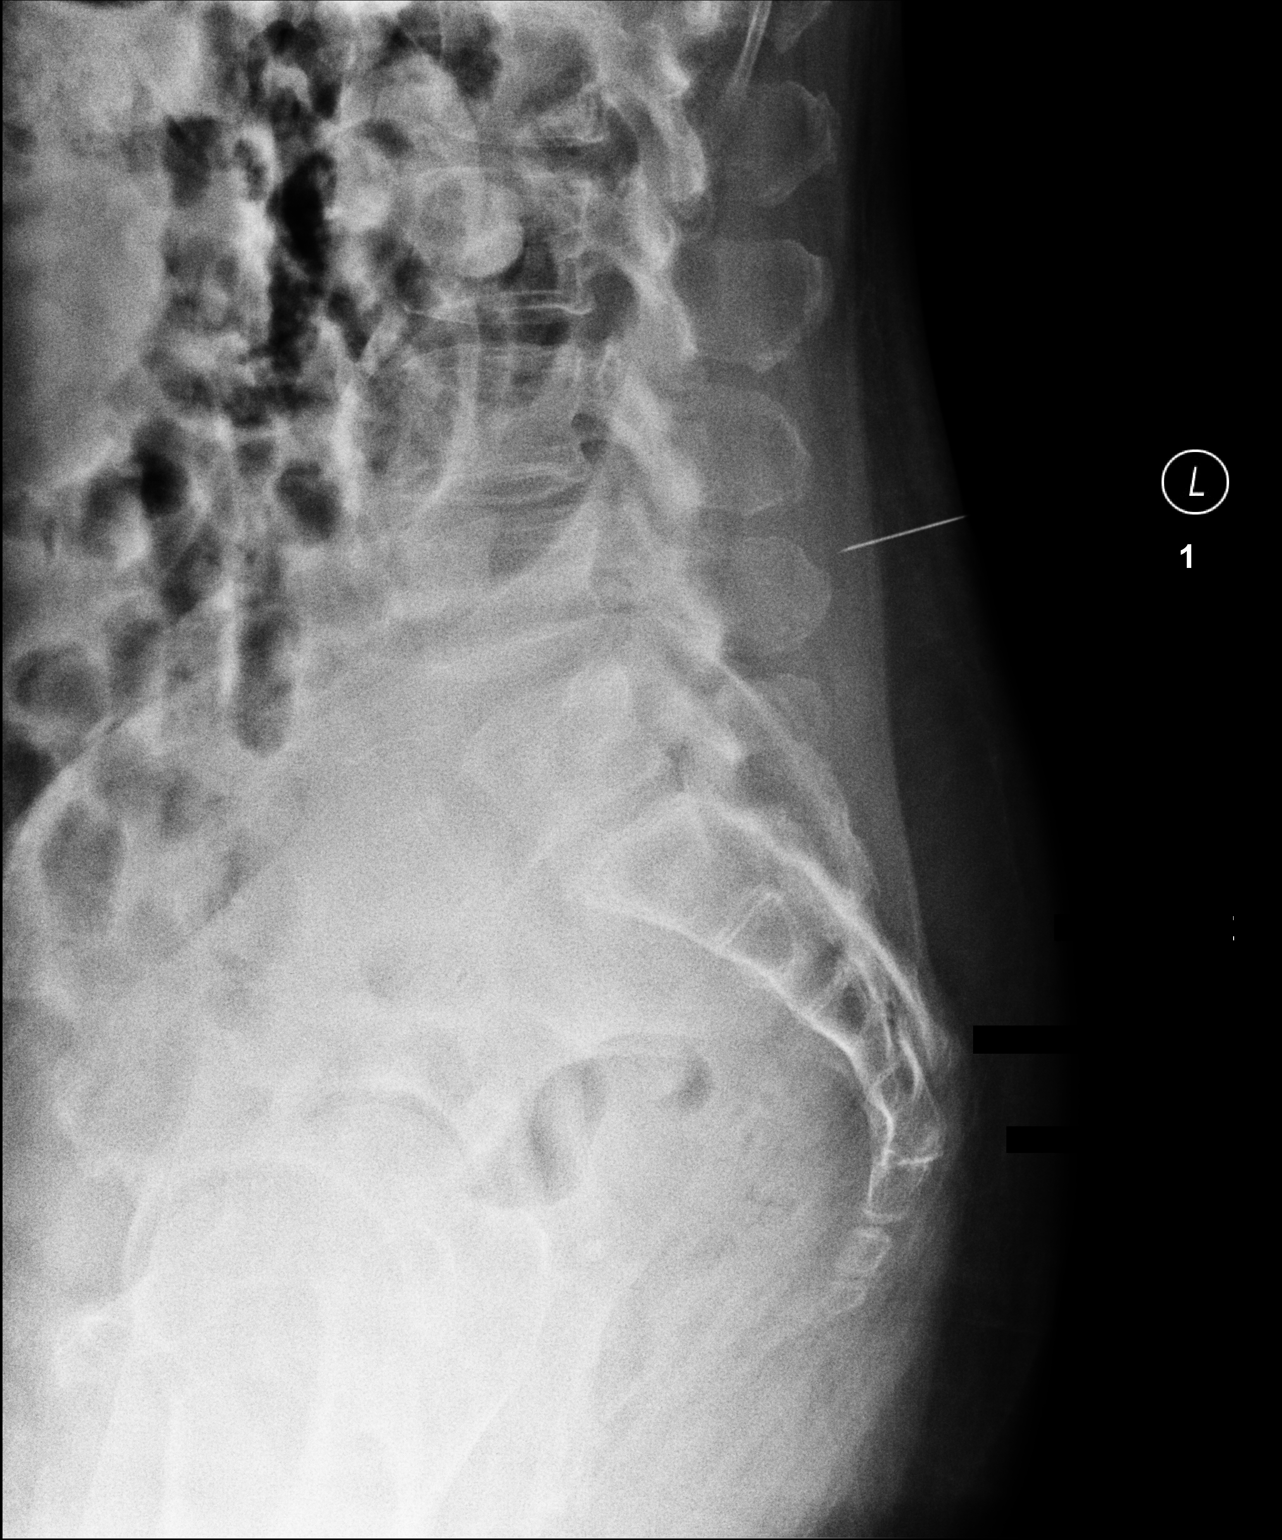

[lateral (2 of 2)]
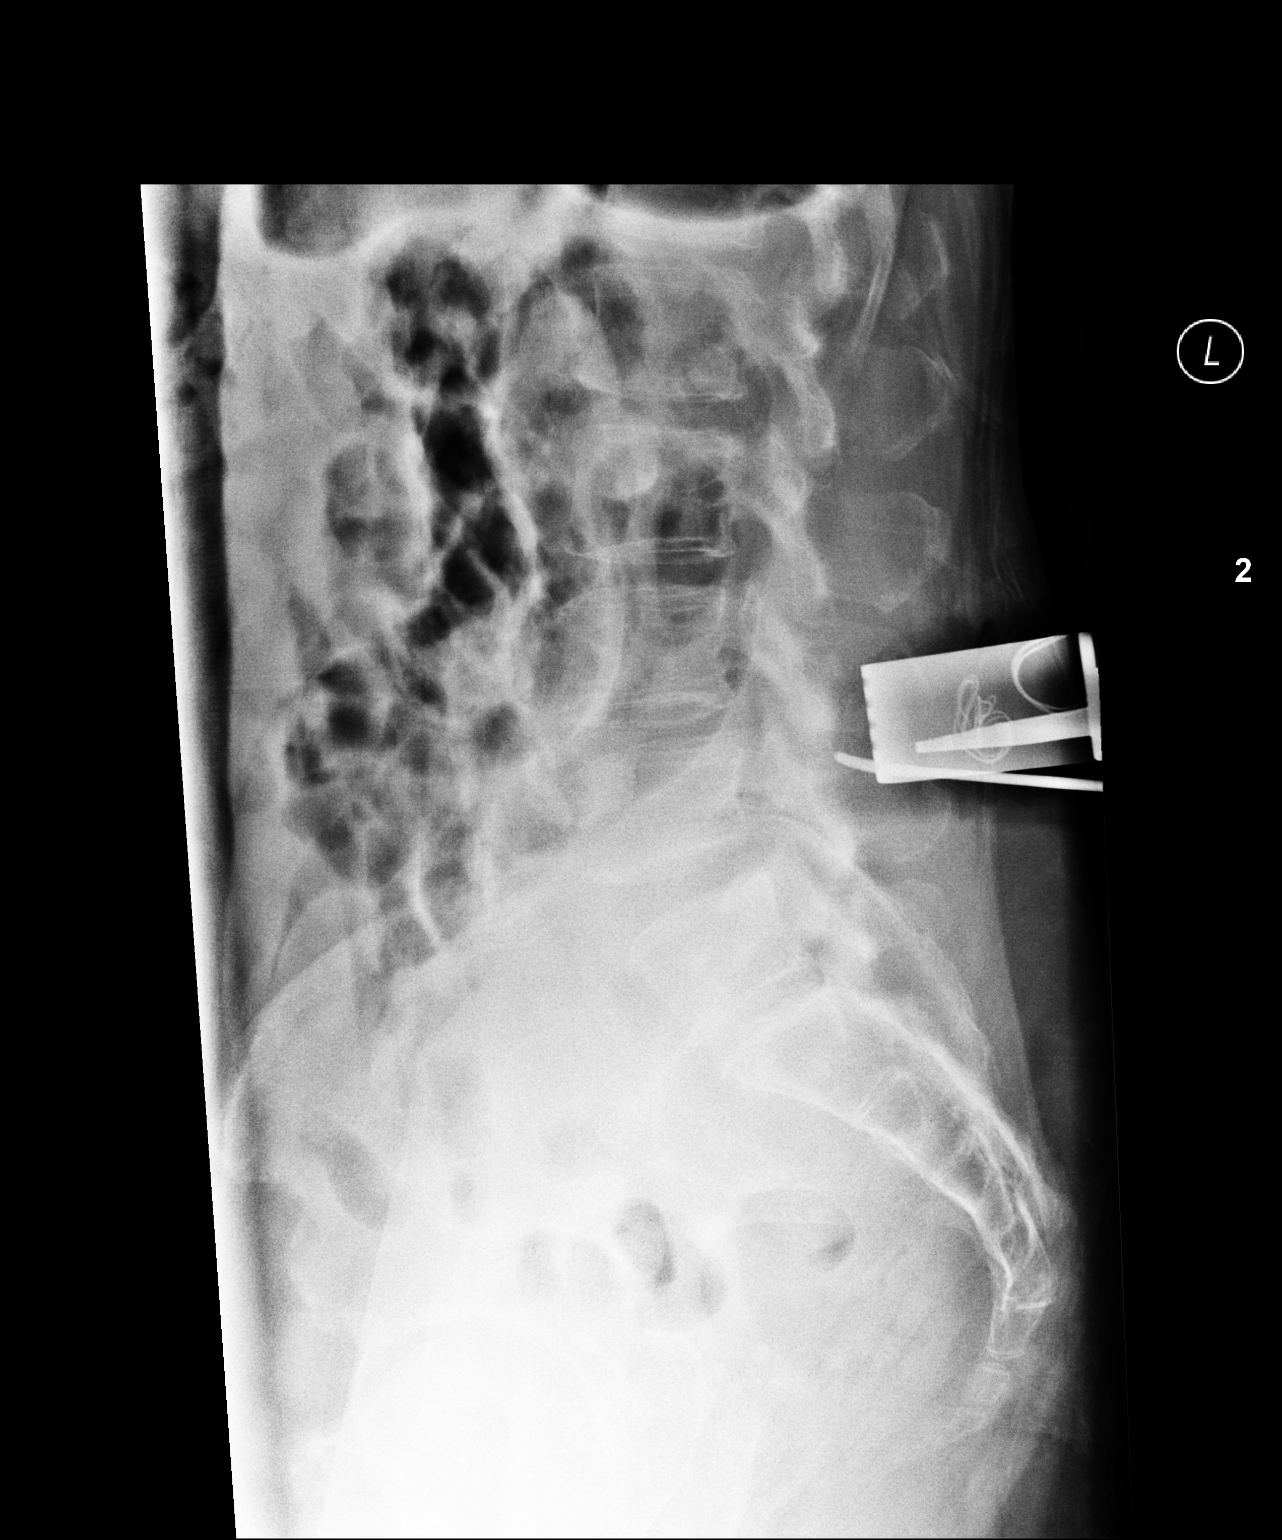

[2 of 2 positions shown; findings below may reference images not displayed]

FINDINGS: A needle-like metallic marker is seen posterior to the spinous
process of L5, based on transitional lumbar anatomy determined on
prior MRI examination. Normal lumbar lordosis. No listhesis.
IMPRESSION: Metallic marker posterior to the spinous process of L5. Transitional
lumbar anatomy again noted, better defined on prior MRI examination.

## 2020-12-12 SURGERY — LUMBAR LAMINECTOMY/DECOMPRESSION MICRODISCECTOMY 1 LEVEL
Anesthesia: General | Site: Back | Laterality: Left

## 2020-12-12 MED ORDER — TRAMADOL HCL 50 MG PO TABS: 50.0000 mg | ORAL_TABLET | ORAL | Status: DC | PRN

## 2020-12-12 MED ORDER — FENTANYL CITRATE (PF) 100 MCG/2ML IJ SOLN
INTRAMUSCULAR | Status: AC
  Filled 2020-12-12: qty 2

## 2020-12-12 MED ORDER — ALUM & MAG HYDROXIDE-SIMETH 200-200-20 MG/5ML PO SUSP: 30.0000 mL | Freq: Four times a day (QID) | ORAL | Status: DC | PRN

## 2020-12-12 MED ORDER — SODIUM CHLORIDE 0.9 % IV SOLN
250.0000 mL | INTRAVENOUS | Status: DC
  Administered 2020-12-12: 250 mL via INTRAVENOUS

## 2020-12-12 MED ORDER — POTASSIUM CHLORIDE CRYS ER 20 MEQ PO TBCR: 10.0000 meq | EXTENDED_RELEASE_TABLET | Freq: Every day | ORAL | Status: DC

## 2020-12-12 MED ORDER — EPHEDRINE SULFATE-NACL 50-0.9 MG/10ML-% IV SOSY
PREFILLED_SYRINGE | INTRAVENOUS | Status: DC | PRN
  Administered 2020-12-12: 5 mg via INTRAVENOUS

## 2020-12-12 MED ORDER — FENTANYL CITRATE (PF) 250 MCG/5ML IJ SOLN
INTRAMUSCULAR | Status: AC
  Filled 2020-12-12: qty 5

## 2020-12-12 MED ORDER — OXYCODONE HCL 10 MG PO TABS: 10.0000 mg | ORAL_TABLET | ORAL | 0 refills | Status: DC | PRN

## 2020-12-12 MED ORDER — ATORVASTATIN CALCIUM 10 MG PO TABS: 20.0000 mg | ORAL_TABLET | Freq: Every day | ORAL | Status: DC

## 2020-12-12 MED ORDER — ROCURONIUM BROMIDE 10 MG/ML (PF) SYRINGE
PREFILLED_SYRINGE | INTRAVENOUS | Status: AC
  Filled 2020-12-12: qty 10

## 2020-12-12 MED ORDER — PHENOL 1.4 % MT LIQD: 1.0000 | OROMUCOSAL | Status: DC | PRN

## 2020-12-12 MED ORDER — BUPIVACAINE HCL (PF) 0.25 % IJ SOLN
INTRAMUSCULAR | Status: DC | PRN
  Administered 2020-12-12: 10 mL

## 2020-12-12 MED ORDER — LIDOCAINE-EPINEPHRINE 1 %-1:100000 IJ SOLN
INTRAMUSCULAR | Status: DC | PRN
  Administered 2020-12-12: 10 mL

## 2020-12-12 MED ORDER — SODIUM CHLORIDE 0.9% FLUSH
3.0000 mL | Freq: Two times a day (BID) | INTRAVENOUS | Status: DC
  Administered 2020-12-12: 3 mL via INTRAVENOUS

## 2020-12-12 MED ORDER — ONDANSETRON HCL 4 MG/2ML IJ SOLN
INTRAMUSCULAR | Status: AC
  Filled 2020-12-12: qty 2

## 2020-12-12 MED ORDER — CYCLOBENZAPRINE HCL 10 MG PO TABS: 10.0000 mg | ORAL_TABLET | Freq: Three times a day (TID) | ORAL | Status: DC | PRN

## 2020-12-12 MED ORDER — SODIUM CHLORIDE 0.9% FLUSH: 3.0000 mL | INTRAVENOUS | Status: DC | PRN

## 2020-12-12 MED ORDER — LIDOCAINE 2% (20 MG/ML) 5 ML SYRINGE
INTRAMUSCULAR | Status: DC | PRN
  Administered 2020-12-12: 100 mg via INTRAVENOUS

## 2020-12-12 MED ORDER — NAPROXEN 250 MG PO TABS: 500.0000 mg | ORAL_TABLET | Freq: Two times a day (BID) | ORAL | Status: DC

## 2020-12-12 MED ORDER — HYDROMORPHONE HCL 1 MG/ML IJ SOLN: 0.5000 mg | INTRAMUSCULAR | Status: DC | PRN

## 2020-12-12 MED ORDER — ADULT MULTIVITAMIN W/MINERALS CH: 1.0000 | ORAL_TABLET | Freq: Every day | ORAL | Status: DC

## 2020-12-12 MED ORDER — PANTOPRAZOLE SODIUM 40 MG IV SOLR: 40.0000 mg | Freq: Every day | INTRAVENOUS | Status: DC

## 2020-12-12 MED ORDER — ORAL CARE MOUTH RINSE: 15.0000 mL | Freq: Once | OROMUCOSAL | Status: AC

## 2020-12-12 MED ORDER — PROPOFOL 10 MG/ML IV BOLUS
INTRAVENOUS | Status: AC
  Filled 2020-12-12: qty 20

## 2020-12-12 MED ORDER — ONDANSETRON HCL 4 MG PO TABS
4.0000 mg | ORAL_TABLET | Freq: Four times a day (QID) | ORAL | Status: DC | PRN
Start: 2020-12-12 — End: 2020-12-13

## 2020-12-12 MED ORDER — FENTANYL CITRATE (PF) 250 MCG/5ML IJ SOLN
INTRAMUSCULAR | Status: DC | PRN
  Administered 2020-12-12: 100 ug via INTRAVENOUS
  Administered 2020-12-12: 50 ug via INTRAVENOUS

## 2020-12-12 MED ORDER — THROMBIN 5000 UNITS EX SOLR
OROMUCOSAL | Status: DC | PRN
  Administered 2020-12-12: 5 mL via TOPICAL

## 2020-12-12 MED ORDER — MECLIZINE HCL 12.5 MG PO TABS
12.5000 mg | ORAL_TABLET | Freq: Three times a day (TID) | ORAL | Status: DC | PRN
  Filled 2020-12-12: qty 1

## 2020-12-12 MED ORDER — ACETAMINOPHEN 325 MG PO TABS: 650.0000 mg | ORAL_TABLET | ORAL | Status: DC | PRN

## 2020-12-12 MED ORDER — THROMBIN 5000 UNITS EX SOLR
CUTANEOUS | Status: AC
  Filled 2020-12-12: qty 10000

## 2020-12-12 MED ORDER — SUGAMMADEX SODIUM 200 MG/2ML IV SOLN
INTRAVENOUS | Status: DC | PRN
  Administered 2020-12-12: 200 mg via INTRAVENOUS

## 2020-12-12 MED ORDER — 0.9 % SODIUM CHLORIDE (POUR BTL) OPTIME
TOPICAL | Status: DC | PRN
  Administered 2020-12-12: 1000 mL

## 2020-12-12 MED ORDER — OXYCODONE HCL 5 MG PO TABS: 5.0000 mg | ORAL_TABLET | Freq: Four times a day (QID) | ORAL | Status: DC | PRN

## 2020-12-12 MED ORDER — DEXAMETHASONE SODIUM PHOSPHATE 10 MG/ML IJ SOLN
10.0000 mg | Freq: Once | INTRAMUSCULAR | Status: AC
  Administered 2020-12-12: 10 mg via INTRAVENOUS

## 2020-12-12 MED ORDER — LISINOPRIL 20 MG PO TABS: 20.0000 mg | ORAL_TABLET | Freq: Every day | ORAL | Status: DC

## 2020-12-12 MED ORDER — SULFAMETHOXAZOLE-TRIMETHOPRIM 800-160 MG PO TABS: 1.0000 | ORAL_TABLET | Freq: Two times a day (BID) | ORAL | Status: DC

## 2020-12-12 MED ORDER — ONDANSETRON HCL 4 MG/2ML IJ SOLN
INTRAMUSCULAR | Status: DC | PRN
  Administered 2020-12-12: 4 mg via INTRAVENOUS

## 2020-12-12 MED ORDER — LIDOCAINE-EPINEPHRINE 1 %-1:100000 IJ SOLN
INTRAMUSCULAR | Status: AC
  Filled 2020-12-12: qty 1

## 2020-12-12 MED ORDER — LIDOCAINE 2% (20 MG/ML) 5 ML SYRINGE
INTRAMUSCULAR | Status: AC
  Filled 2020-12-12: qty 5

## 2020-12-12 MED ORDER — DEXAMETHASONE SODIUM PHOSPHATE 10 MG/ML IJ SOLN
INTRAMUSCULAR | Status: AC
  Filled 2020-12-12: qty 1

## 2020-12-12 MED ORDER — MIDAZOLAM HCL 2 MG/2ML IJ SOLN
INTRAMUSCULAR | Status: DC | PRN
  Administered 2020-12-12: 2 mg via INTRAVENOUS

## 2020-12-12 MED ORDER — VANCOMYCIN HCL IN DEXTROSE 1-5 GM/200ML-% IV SOLN
1000.0000 mg | INTRAVENOUS | Status: AC
  Administered 2020-12-12: 1000 mg via INTRAVENOUS
  Filled 2020-12-12: qty 200

## 2020-12-12 MED ORDER — PROPOFOL 10 MG/ML IV BOLUS
INTRAVENOUS | Status: DC | PRN
  Administered 2020-12-12: 150 mg via INTRAVENOUS

## 2020-12-12 MED ORDER — MENTHOL 3 MG MT LOZG: 1.0000 | LOZENGE | OROMUCOSAL | Status: DC | PRN

## 2020-12-12 MED ORDER — FENTANYL CITRATE (PF) 100 MCG/2ML IJ SOLN
25.0000 ug | INTRAMUSCULAR | Status: DC | PRN
  Administered 2020-12-12 (×3): 50 ug via INTRAVENOUS

## 2020-12-12 MED ORDER — LACTATED RINGERS IV SOLN: INTRAVENOUS | Status: DC

## 2020-12-12 MED ORDER — ROCURONIUM BROMIDE 10 MG/ML (PF) SYRINGE
PREFILLED_SYRINGE | INTRAVENOUS | Status: DC | PRN
  Administered 2020-12-12: 50 mg via INTRAVENOUS
  Administered 2020-12-12: 20 mg via INTRAVENOUS

## 2020-12-12 MED ORDER — HYDROCHLOROTHIAZIDE 12.5 MG PO CAPS: 12.5000 mg | ORAL_CAPSULE | Freq: Every day | ORAL | Status: DC

## 2020-12-12 MED ORDER — CHLORHEXIDINE GLUCONATE CLOTH 2 % EX PADS: 6.0000 | MEDICATED_PAD | Freq: Once | CUTANEOUS | Status: DC

## 2020-12-12 MED ORDER — ACETAMINOPHEN 650 MG RE SUPP: 650.0000 mg | RECTAL | Status: DC | PRN

## 2020-12-12 MED ORDER — CHLORHEXIDINE GLUCONATE 0.12 % MT SOLN
15.0000 mL | Freq: Once | OROMUCOSAL | Status: AC
  Administered 2020-12-12: 15 mL via OROMUCOSAL
  Filled 2020-12-12: qty 15

## 2020-12-12 MED ORDER — MIDAZOLAM HCL 2 MG/2ML IJ SOLN
INTRAMUSCULAR | Status: AC
  Filled 2020-12-12: qty 2

## 2020-12-12 MED ORDER — OXYCODONE HCL 5 MG PO TABS
10.0000 mg | ORAL_TABLET | ORAL | Status: DC | PRN
  Administered 2020-12-12: 10 mg via ORAL
  Filled 2020-12-12: qty 2

## 2020-12-12 MED ORDER — LISINOPRIL-HYDROCHLOROTHIAZIDE 20-12.5 MG PO TABS: 1.0000 | ORAL_TABLET | Freq: Every day | ORAL | Status: DC

## 2020-12-12 MED ORDER — IBUPROFEN 800 MG PO TABS: 800.0000 mg | ORAL_TABLET | Freq: Three times a day (TID) | ORAL | Status: DC | PRN

## 2020-12-12 MED ORDER — EPHEDRINE 5 MG/ML INJ
INTRAVENOUS | Status: AC
  Filled 2020-12-12: qty 5

## 2020-12-12 MED ORDER — CEFAZOLIN SODIUM-DEXTROSE 2-4 GM/100ML-% IV SOLN
2.0000 g | Freq: Three times a day (TID) | INTRAVENOUS | Status: DC
Start: 2020-12-12 — End: 2020-12-13
  Administered 2020-12-12: 2 g via INTRAVENOUS
  Filled 2020-12-12: qty 100

## 2020-12-12 MED ORDER — ONDANSETRON HCL 4 MG/2ML IJ SOLN: 4.0000 mg | Freq: Four times a day (QID) | INTRAMUSCULAR | Status: DC | PRN

## 2020-12-12 MED ORDER — THROMBIN 5000 UNITS EX SOLR
CUTANEOUS | Status: DC | PRN
  Administered 2020-12-12: 5000 [IU] via TOPICAL

## 2020-12-12 MED ORDER — BUPIVACAINE HCL (PF) 0.25 % IJ SOLN
INTRAMUSCULAR | Status: AC
  Filled 2020-12-12: qty 30

## 2020-12-12 SURGICAL SUPPLY — 57 items
ADH SKN CLS APL DERMABOND .7 (GAUZE/BANDAGES/DRESSINGS) ×1
ADH SKN CLS LQ APL DERMABOND (GAUZE/BANDAGES/DRESSINGS) ×1
APL SKNCLS STERI-STRIP NONHPOA (GAUZE/BANDAGES/DRESSINGS) ×1
BAG COUNTER SPONGE SURGICOUNT (BAG) ×2 IMPLANT
BAG SPNG CNTER NS LX DISP (BAG) ×1
BAND INSRT 18 STRL LF DISP RB (MISCELLANEOUS) ×2
BAND RUBBER #18 3X1/16 STRL (MISCELLANEOUS) ×4 IMPLANT
BENZOIN TINCTURE PRP APPL 2/3 (GAUZE/BANDAGES/DRESSINGS) ×2 IMPLANT
BLADE SURG 11 STRL SS (BLADE) ×2 IMPLANT
BUR CUTTER 7.0 ROUND (BURR) ×2 IMPLANT
BUR MATCHSTICK NEURO 3.0 LAGG (BURR) ×2 IMPLANT
CANISTER SUCT 3000ML PPV (MISCELLANEOUS) ×2 IMPLANT
CARTRIDGE OIL MAESTRO DRILL (MISCELLANEOUS) ×1 IMPLANT
CLSR STERI-STRIP ANTIMIC 1/2X4 (GAUZE/BANDAGES/DRESSINGS) ×1 IMPLANT
DECANTER SPIKE VIAL GLASS SM (MISCELLANEOUS) ×2 IMPLANT
DERMABOND ADHESIVE PROPEN (GAUZE/BANDAGES/DRESSINGS) ×1
DERMABOND ADVANCED (GAUZE/BANDAGES/DRESSINGS) ×1
DERMABOND ADVANCED .7 DNX12 (GAUZE/BANDAGES/DRESSINGS) ×1 IMPLANT
DERMABOND ADVANCED .7 DNX6 (GAUZE/BANDAGES/DRESSINGS) IMPLANT
DIFFUSER DRILL AIR PNEUMATIC (MISCELLANEOUS) ×2 IMPLANT
DRAPE HALF SHEET 40X57 (DRAPES) ×2 IMPLANT
DRAPE LAPAROTOMY 100X72X124 (DRAPES) ×2 IMPLANT
DRAPE MICROSCOPE LEICA (MISCELLANEOUS) ×2 IMPLANT
DRAPE SURG 17X23 STRL (DRAPES) ×2 IMPLANT
DRSG OPSITE POSTOP 4X6 (GAUZE/BANDAGES/DRESSINGS) ×1 IMPLANT
DURAPREP 26ML APPLICATOR (WOUND CARE) ×2 IMPLANT
ELECT REM PT RETURN 9FT ADLT (ELECTROSURGICAL) ×2
ELECTRODE REM PT RTRN 9FT ADLT (ELECTROSURGICAL) ×1 IMPLANT
GAUZE 4X4 16PLY ~~LOC~~+RFID DBL (SPONGE) ×1 IMPLANT
GAUZE SPONGE 4X4 12PLY STRL (GAUZE/BANDAGES/DRESSINGS) ×2 IMPLANT
GLOVE EXAM NITRILE XL STR (GLOVE) ×1 IMPLANT
GLOVE SURG ENC MOIS LTX SZ8 (GLOVE) ×2 IMPLANT
GLOVE SURG UNDER LTX SZ8.5 (GLOVE) ×2 IMPLANT
GLOVE SURG UNDER POLY LF SZ7 (GLOVE) ×1 IMPLANT
GOWN STRL REUS W/ TWL LRG LVL3 (GOWN DISPOSABLE) ×1 IMPLANT
GOWN STRL REUS W/ TWL XL LVL3 (GOWN DISPOSABLE) ×2 IMPLANT
GOWN STRL REUS W/TWL 2XL LVL3 (GOWN DISPOSABLE) ×2 IMPLANT
GOWN STRL REUS W/TWL LRG LVL3 (GOWN DISPOSABLE) ×2
GOWN STRL REUS W/TWL XL LVL3 (GOWN DISPOSABLE) ×4
KIT BASIN OR (CUSTOM PROCEDURE TRAY) ×2 IMPLANT
KIT TURNOVER KIT B (KITS) ×2 IMPLANT
NDL SPNL 22GX3.5 QUINCKE BK (NEEDLE) ×1 IMPLANT
NEEDLE HYPO 22GX1.5 SAFETY (NEEDLE) ×2 IMPLANT
NEEDLE SPNL 22GX3.5 QUINCKE BK (NEEDLE) ×2 IMPLANT
NS IRRIG 1000ML POUR BTL (IV SOLUTION) ×2 IMPLANT
OIL CARTRIDGE MAESTRO DRILL (MISCELLANEOUS) ×2
PACK LAMINECTOMY NEURO (CUSTOM PROCEDURE TRAY) ×2 IMPLANT
SPONGE SURGIFOAM ABS GEL SZ50 (HEMOSTASIS) ×2 IMPLANT
STRIP CLOSURE SKIN 1/2X4 (GAUZE/BANDAGES/DRESSINGS) ×2 IMPLANT
SUT VIC AB 0 CT1 18XCR BRD8 (SUTURE) ×1 IMPLANT
SUT VIC AB 0 CT1 8-18 (SUTURE) ×2
SUT VIC AB 2-0 CT1 18 (SUTURE) ×2 IMPLANT
SUT VIC AB 3-0 SH 8-18 (SUTURE) ×1 IMPLANT
SUT VICRYL 4-0 PS2 18IN ABS (SUTURE) ×2 IMPLANT
TOWEL GREEN STERILE (TOWEL DISPOSABLE) ×2 IMPLANT
TOWEL GREEN STERILE FF (TOWEL DISPOSABLE) ×2 IMPLANT
WATER STERILE IRR 1000ML POUR (IV SOLUTION) ×2 IMPLANT

## 2020-12-12 NOTE — Evaluation (Signed)
Physical Therapy Evaluation Patient Details Name: Amber Drake MRN: [id number] DOB: 1960/04/20 Today's Date: 12/12/2020  History of Present Illness  60 yo female s/p decompressive laminectomy on the left at L5-S1 with microscopic foraminotomies of the left S1 nerve root on 9/12. PMH includes anemia, OA, lymphoma, HTN, sickle cell trait, bilat carpal tunnel release.   Clinical Impression  Pt presents with [facility] strength, initially decreased knowledge and application of precautions but improved by end of session, WFL gait, and good activity tolerance. PT administered, reviewed back precautions handout, pt with no questions and all PT education completed. PT to sign off, appropriate to d/c home from PT standpoint.         Recommendations for follow up therapy are one component of a multi-disciplinary discharge planning process, led by the attending physician.  Recommendations may be updated based on patient status, additional functional criteria and insurance authorization.  Follow Up Recommendations No PT follow up    Equipment Recommendations  3in1 (PT)    Recommendations for Other Services       Precautions / Restrictions Precautions Precautions: Fall;Back Precaution Booklet Issued: Yes (comment) Restrictions Weight Bearing Restrictions: No      Mobility  Bed Mobility Overal bed mobility: Needs Assistance Bed Mobility: Rolling;Sidelying to Sit;Sit to Sidelying Rolling: Min guard Sidelying to sit: Min guard     Sit to sidelying: Min guard General bed mobility comments: close guard for safety, and cuing for correct log roll technique. Practiced x2, pt's sister and mother present and will be assisting pt at d/c.    Transfers Overall transfer level: Modified independent Equipment used: None             General transfer comment: increased time  Ambulation/Gait Ambulation/Gait assistance: Modified independent (Device/Increase time) Gait Distance (Feet): 300  Feet Assistive device: None Gait Pattern/deviations: Step-through pattern;WFL(Within Functional Limits) Gait velocity: decr   General Gait Details: WFL stride length, slightly slowed gait secondary to pain  Stairs Stairs: Yes Stairs assistance: Supervision Stair Management: No rails;Step to pattern;Forwards Number of Stairs: 2 General stair comments: wfl, but increased time  Wheelchair Mobility    Modified Rankin (Stroke Patients Only)       Balance Overall balance assessment: Modified Independent                                           Pertinent Vitals/Pain Pain Assessment: Faces Faces Pain Scale: Hurts little more Pain Location: back, incisional Pain Descriptors / Indicators: Sore;Discomfort Pain Intervention(s): Limited activity within patient's tolerance;Monitored during session;Repositioned    Home Living Family/patient expects to be discharged to:: Private residence Living Arrangements: Spouse/significant other Available Help at Discharge: Family Type of Home: House Home Access: Stairs to enter Entrance Stairs-Rails: None Technical brewer of Steps: 2 Home Layout: One level Home Equipment: None      Prior Function Level of Independence: Independent               Hand Dominance   Dominant Hand: Right    Extremity/Trunk Assessment   Upper Extremity Assessment Upper Extremity Assessment: Defer to OT evaluation    Lower Extremity Assessment Lower Extremity Assessment: Overall WFL for tasks assessed    Cervical / Trunk Assessment Cervical / Trunk Assessment: Other exceptions Cervical / Trunk Exceptions: s/p lumbar surgery  Communication   Communication: No difficulties  Cognition Arousal/Alertness: Awake/alert Behavior During Therapy: WFL for tasks assessed/performed Overall  Cognitive Status: Within Functional Limits for tasks assessed                                        General Comments       Exercises Other Exercises Other Exercises: home mobility: PT encouraging up and walking 1x/hour with supervision for household distances to promote strength maintenance, decrease back stiffness, and promote circulation   Assessment/Plan    PT Assessment Patent does not need any further PT services  PT Problem List         PT Treatment Interventions      PT Goals (Current goals can be found in the Care Plan section)  Acute Rehab PT Goals Patient Stated Goal: home PT Goal Formulation: With patient Time For Goal Achievement: 12/12/20 Potential to Achieve Goals: Good    Frequency     Barriers to discharge        Co-evaluation               AM-PAC PT "6 Clicks" Mobility  Outcome Measure Help needed turning from your back to your side while in a flat bed without using bedrails?: A Little Help needed moving from lying on your back to sitting on the side of a flat bed without using bedrails?: A Little Help needed moving to and from a bed to a chair (including a wheelchair)?: A Little Help needed standing up from a chair using your arms (e.g., wheelchair or bedside chair)?: None Help needed to walk in hospital room?: None Help needed climbing 3-5 steps with a railing? : None 6 Click Score: 21    End of Session   Activity Tolerance: Patient tolerated treatment well Patient left: in bed;with call bell/phone within reach;with family/visitor present Nurse Communication: Mobility status PT Visit Diagnosis: Other abnormalities of gait and mobility (R26.89)    Time: [id number] PT Time Calculation (min) (ACUTE ONLY): 12 min   Charges:   PT Evaluation $PT Eval Low Complexity: 1 Low         Stacie Glaze, PT DPT Acute Rehabilitation Services Pager [phone]  Office [phone]   Louis Matte 12/12/2020, 4:31 PM

## 2020-12-12 NOTE — Plan of Care (Signed)
WNL

## 2020-12-12 NOTE — H&P (Signed)
Amber Drake is an 60 y.o. female.   Chief Complaint: Back and left leg pain HPI: 60 year old female with back and left leg pain rating down posterior thigh posterior calf outside above her consistent with an S1 nerve root pattern.  Work-up revealed transitional anatomy but at the level above this it were calling L5-S1 severe lateral recess stenosis on the left.  Due to patient's progression of clinical syndrome imaging findings and failed conservative treatment I recommended decompressive laminotomy on the left at L5-S1.  I have extensively reviewed the risks and benefits of the operation with her as well as perioperative course expectations of outcome and alternatives of surgery and she understood and agreed to proceed forward.  Past Medical History:  Diagnosis Date   Anemia    Arthritis    Blood dyscrasia    sickle cell trait   Cancer ([place])    lymphoma   Carbuncle and furuncle    Hypercholesterolemia    Hypertension    does not take meds   Papanicolaou smear of cervix with positive high risk human papilloma virus (HPV) test 08/23/2020   08/23/20    [place] per ASCCP guidelines, immediate risk of CIN 3+ is 4.1 %   Sickle cell trait ([place])     Past Surgical History:  Procedure Laterality Date   APPENDECTOMY     APPLICATION OF CRANIAL NAVIGATION N/A 07/28/2018   Procedure: APPLICATION OF CRANIAL NAVIGATION;  Surgeon: Kary Kos, MD;  Location: [facility];  Service: Neurosurgery;  Laterality: N/A;   BRAIN SURGERY     CARPAL TUNNEL RELEASE  03/23/2011   Procedure: CARPAL TUNNEL RELEASE;  Surgeon: Sanjuana Kava;  [facility];  Service: Orthopedics;  Laterality: Left;   CARPAL TUNNEL RELEASE  05/10/2011   Procedure: CARPAL TUNNEL RELEASE;  Surgeon: Sanjuana Kava, MD;  [facility];  Service: Orthopedics;  Laterality: Right;   CESAREAN SECTION     x 2   COLONOSCOPY WITH PROPOFOL N/A 11/16/2020   Procedure: COLONOSCOPY WITH PROPOFOL;  Surgeon: Harvel Quale, MD;  [facility];  Service: Gastroenterology;  Laterality: N/A;  9:15   INCISION AND DRAINAGE PERIRECTAL ABSCESS  12/21/09   PR DURAL GRAFT REPAIR,SPINE DEFECT N/A 07/28/2018   Procedure: Stereotactic open biopsy of Right cerebellar hemisphere and dura with brainlab;  Surgeon: Kary Kos, MD;  Location: [facility];  Service: Neurosurgery;  Laterality: N/A;  Stereotactic open biopsy of Right cerebellar hemisphere and dura with brainlab   PROCTOSCOPY  10/17/2010   Procedure: PROCTOSCOPY;  Surgeon: Scherry Ran;  [facility];  Service: General;  Laterality: N/A;  Rigid Proctoscopy/Possible Fistula in Ano  Procedure ended at [place]     x2    Family History  Problem Relation Age of Onset   Hypertension Mother    Hyperlipidemia Mother    Hypertension Father    Lupus Father        skin   Sarcoidosis Sister    Kidney failure Daughter    Sickle cell anemia Daughter    Sickle cell trait Daughter    Sickle cell anemia Son    Hypertension Other    Anesthesia problems Neg Hx    Hypotension Neg Hx    Malignant hyperthermia Neg Hx    Pseudochol deficiency Neg Hx    Cancer - Colon Neg Hx    Social History:  reports that she has been smoking cigarettes. She has a 15.00 pack-year smoking history. She has never used smokeless  tobacco. She reports that she does not drink alcohol and does not use drugs.  Allergies:  Allergies  Allergen Reactions   Penicillins Anaphylaxis and Hives    Has patient had a PCN reaction causing immediate rash, facial/tongue/throat swelling, SOB or lightheadedness with hypotension: YES Has patient had a PCN reaction causing severe rash involving mucus membranes or skin necrosis: UNKNOWN  Has patient had a PCN reaction that required hospitalization: NO Has patient had a PCN reaction occurring within the last 10 years: NO If all of the above answers are "NO", then may proceed with Cephalosporin use.    Penicillins Cross Reactors Itching and Swelling     PENICILLIN > ANAPHYLAXIS   Tape Itching    Medications Prior to Admission  Medication Sig Dispense Refill   atorvastatin (LIPITOR) 20 MG tablet Take 1 tablet (20 mg total) by mouth daily. 90 tablet 2   ibuprofen (ADVIL) 800 MG tablet Take 1 tablet (800 mg total) by mouth every 8 (eight) hours as needed. 20 tablet 1   lisinopril-hydrochlorothiazide (PRINZIDE,ZESTORETIC) 20-12.5 MG tablet Take 1 tablet by mouth daily.     Multiple Vitamin (MULTIVITAMIN WITH MINERALS) TABS tablet Take 1 tablet by mouth daily.     oxyCODONE (OXY IR/ROXICODONE) 5 MG immediate release tablet Take 5 mg by mouth every 6 (six) hours as needed for pain.     sulfamethoxazole-trimethoprim (BACTRIM DS) 800-160 MG tablet Take 1 tablet by mouth 2 (two) times daily. X 7 days 14 tablet 0   traMADol (ULTRAM) 50 MG tablet Take 50 mg by mouth every 4 (four) hours as needed for moderate pain.     meclizine (ANTIVERT) 12.5 MG tablet Take 1 tablet (12.5 mg total) by mouth 3 (three) times daily as needed for dizziness. (Patient not taking: No sig reported) 8 tablet 0   naproxen (NAPROSYN) 500 MG tablet Take 1 tablet (500 mg total) by mouth 2 (two) times daily with a meal. (Patient not taking: Reported on 11/23/2020) 60 tablet 5   potassium chloride SA (KLOR-CON) 20 MEQ tablet TAKE (1) TABLET BY MOUTH TWICE DAILY. (Patient not taking: No sig reported) 60 tablet 1    No results found for this or any previous visit (from the past 48 hour(s)). No results found.  Review of Systems  Musculoskeletal:  Positive for back pain.  Neurological:  Positive for numbness.   Blood pressure 118/64, pulse 62, temperature 98.7 F (37.1 C), temperature source Oral, resp. rate 17, height '5\' 7"'$  (1.702 m), weight 77.1 kg, last menstrual period 08/12/2010, SpO2 100 %. Physical Exam HENT:     Head: Normocephalic.     Right Ear: Tympanic membrane normal.     Nose: Nose normal.  Eyes:     Pupils: Pupils are equal, round, and reactive to light.   Cardiovascular:     Rate and Rhythm: Normal rate.  Pulmonary:     Effort: Pulmonary effort is normal.  Abdominal:     General: Abdomen is flat.  Skin:    General: Skin is warm.  Neurological:     General: No focal deficit present.     Mental Status: She is alert.     Comments: Awake alert strength 5 out of 5 iliopsoas, quads, hamstrings, gastroc, into tibialis, and EHL.     Assessment/Plan 60 year old presents for left-sided decompressive laminectomy L5-S1  Elaina Hoops, MD 12/12/2020, 9:36 AM

## 2020-12-12 NOTE — Discharge Summary (Signed)
Physician Discharge Summary  Patient ID: Amber Drake MRN: [id number] DOB/AGE: 1961/03/03 60 y.o. Estimated body mass index is 26.63 kg/m as calculated from the following:   Height as of this encounter: '5\' 7"'$  (1.702 m).   Weight as of this encounter: 77.1 kg.   Admit date: 12/12/2020 Discharge date: 12/12/2020  Admission Diagnoses: Lumbar spinal stenosis L5-S1  Discharge Diagnoses: Same Active Problems:   Spinal stenosis of lumbar region   Discharged Condition: good  Hospital Course: Patient admitted to hospital underwent decompressive laminotomy L5-S1 left.  Postoperatively patient did very well covering the floor on the floor was ambulating and voiding spontaneously tolerating regular diet and stable for discharge home.  Patient be discharged scheduled follow-up in 1 to 2 weeks.  Consults: Significant Diagnostic Studies: Treatments: Decompressive laminectomy on the left at L5-S1 Discharge Exam: Blood pressure (!) 139/54, pulse 67, temperature 98.2 F (36.8 C), temperature source Oral, resp. rate 16, height '5\' 7"'$  (1.702 m), weight 77.1 kg, last menstrual period 08/12/2010, SpO2 98 %. Strength 5 out of 5 wound clean dry and intact  Disposition: Home   Allergies as of 12/12/2020       Reactions   Penicillins Anaphylaxis, Hives   Has patient had a PCN reaction causing immediate rash, facial/tongue/throat swelling, SOB or lightheadedness with hypotension: YES Has patient had a PCN reaction causing severe rash involving mucus membranes or skin necrosis: UNKNOWN  Has patient had a PCN reaction that required hospitalization: NO Has patient had a PCN reaction occurring within the last 10 years: NO If all of the above answers are "NO", then may proceed with Cephalosporin use.   Penicillins Cross Reactors Itching, Swelling   PENICILLIN > ANAPHYLAXIS   Tape Itching        Medication List     TAKE these medications    atorvastatin 20 MG tablet Commonly known as:  LIPITOR Take 1 tablet (20 mg total) by mouth daily.   ibuprofen 800 MG tablet Commonly known as: ADVIL Take 1 tablet (800 mg total) by mouth every 8 (eight) hours as needed.   lisinopril-hydrochlorothiazide 20-12.5 MG tablet Commonly known as: ZESTORETIC Take 1 tablet by mouth daily.   meclizine 12.5 MG tablet Commonly known as: ANTIVERT Take 1 tablet (12.5 mg total) by mouth 3 (three) times daily as needed for dizziness.   multivitamin with minerals Tabs tablet Take 1 tablet by mouth daily.   naproxen 500 MG tablet Commonly known as: NAPROSYN Take 1 tablet (500 mg total) by mouth 2 (two) times daily with a meal.   oxyCODONE 5 MG immediate release tablet Commonly known as: Oxy IR/ROXICODONE Take 5 mg by mouth every 6 (six) hours as needed for pain. What changed: Another medication with the same name was added. Make sure you understand how and when to take each.   Oxycodone HCl 10 MG Tabs Take 1 tablet (10 mg total) by mouth every 3 (three) hours as needed for severe pain ((score 7 to 10)). What changed: You were already taking a medication with the same name, and this prescription was added. Make sure you understand how and when to take each.   potassium chloride SA 20 MEQ tablet Commonly known as: KLOR-CON TAKE (1) TABLET BY MOUTH TWICE DAILY.   sulfamethoxazole-trimethoprim 800-160 MG tablet Commonly known as: BACTRIM DS Take 1 tablet by mouth 2 (two) times daily. X 7 days   traMADol 50 MG tablet Commonly known as: ULTRAM Take 50 mg by mouth every 4 (four) hours as needed for  moderate pain.         Signed: Elaina Hoops 12/12/2020, 4:32 PM

## 2020-12-12 NOTE — Progress Notes (Signed)
Occupational Therapy Evaluation Patient Details Name: Amber Drake MRN: [id number] DOB: [birth year] Today's Date: 12/12/2020   History of Present Illness 60 yo female s/p decompressive laminectomy on the left at L5-S1 with microscopic foraminotomies of the left S1 nerve root on 9/12. PMH includes anemia, OA, lymphoma, HTN, sickle cell trait, bilat carpal tunnel release.   Clinical Impression   PTA for the above back sx, pt was indep in all ADL/IADLs including driving. She lives at home with family with 2 STE. Upon eval, pt was mod I for ADLs and functional mobility without AD. Pt did require verbal cues to maintain back precautions throughout as she was observed twisting to reach person items. Pt verbalized understanding of back precautions and compensatory techniques. Pt does not require further OT acutely. Recommend d/c to home with supervision initially.     Recommendations for follow up therapy are one component of a multi-disciplinary discharge planning process, led by the attending physician.  Recommendations may be updated based on patient status, additional functional criteria and insurance authorization.   Follow Up Recommendations  No OT follow up;Supervision - Intermittent    Equipment Recommendations  None recommended by OT       Precautions / Restrictions Precautions Precautions: Fall;Back Precaution Booklet Issued: Yes (comment) Precaution Comments: verbally reviewed Restrictions Weight Bearing Restrictions: No      Mobility Bed Mobility Overal bed mobility: Needs Assistance Bed Mobility: Rolling;Sidelying to Sit;Sit to Sidelying Rolling: Min guard Sidelying to sit: Min guard     Sit to sidelying: Min guard General bed mobility comments: Pt standing upon arrival and left sitting EOB. verbally reviewed log roll    Transfers Overall transfer level: Modified independent Equipment used: None             General transfer comment: increased time     Balance Overall balance assessment: Modified Independent                                         ADL either performed or assessed with clinical judgement   ADL Overall ADL's : Modified independent       General ADL Comments: pt was getting dressed in standing upon OT arrival, she dressed with mod I overall however benefited from verbal cues for back precautions. pt donned her pants in standing, educated on safety and importance of maintaing back precuations and advised pt to don LB clothing in sitting. pt completed functional ambulation without AD mod I this session. Compensatory techniques given for oral hygiene     Vision Baseline Vision/History: 0 No visual deficits Vision Assessment?: No apparent visual deficits            Pertinent Vitals/Pain Pain Assessment: Faces Faces Pain Scale: Hurts a little bit Pain Location: back, incisional Pain Descriptors / Indicators: Sore;Discomfort Pain Intervention(s): Limited activity within patient's tolerance;Monitored during session     Hand Dominance Right   Extremity/Trunk Assessment Upper Extremity Assessment Upper Extremity Assessment: Overall WFL for tasks assessed   Lower Extremity Assessment Lower Extremity Assessment: Defer to PT evaluation   Cervical / Trunk Assessment Cervical / Trunk Assessment: Other exceptions Cervical / Trunk Exceptions: s/p lumbar surgery   Communication Communication Communication: No difficulties   Cognition Arousal/Alertness: Awake/alert Behavior During Therapy: WFL for tasks assessed/performed Overall Cognitive Status: Within Functional Limits for tasks assessed  General Comments  VSS on RA    Exercises Other Exercises Other Exercises: home mobility: PT encouraging up and walking 1x/hour with supervision for household distances to promote strength maintenance, decrease back stiffness, and promote circulation    Shoulder Instructions      Home Living Family/patient expects to be discharged to:: Private residence Living Arrangements: Spouse/significant other Available Help at Discharge: Family Type of Home: House Home Access: Stairs to enter Technical brewer of Steps: 2 Entrance Stairs-Rails: None Home Layout: One level     Bathroom Shower/Tub: Teacher, early years/pre: Standard     Home Equipment: None          Prior Functioning/Environment Level of Independence: Independent                 OT Problem List: Pain      OT Treatment/Interventions:      OT Goals(Current goals can be found in the care plan section) Acute Rehab OT Goals Patient Stated Goal: home OT Goal Formulation: All assessment and education complete, DC therapy  OT Frequency:     Barriers to D/C:            Co-evaluation              AM-PAC OT "6 Clicks" Daily Activity     Outcome Measure Help from another person eating meals?: None Help from another person taking care of personal grooming?: None Help from another person toileting, which includes using toliet, bedpan, or urinal?: None Help from another person bathing (including washing, rinsing, drying)?: None Help from another person to put on and taking off regular upper body clothing?: None Help from another person to put on and taking off regular lower body clothing?: None 6 Click Score: 24   End of Session Nurse Communication: Mobility status;Precautions;Weight bearing status  Activity Tolerance: Patient tolerated treatment well Patient left: in bed;with call bell/phone within reach;with family/visitor present  OT Visit Diagnosis: Pain                Time: [id number] OT Time Calculation (min): 11 min Charges:  OT General Charges $OT Visit: 1 Visit OT Evaluation $OT Eval Low Complexity: 1 Low    Thaer Miyoshi A Macey Wurtz 12/12/2020, 4:49 PM

## 2020-12-12 NOTE — Anesthesia Preprocedure Evaluation (Signed)
Anesthesia Evaluation  Patient identified by MRN, date of birth, ID band Patient awake    Reviewed: Allergy & Precautions, NPO status , Patient's Chart, lab work & pertinent test results  Airway Mallampati: II  TM Distance: >3 FB     Dental   Pulmonary Current Smoker and Patient abstained from smoking.,    breath sounds clear to auscultation       Cardiovascular hypertension,  Rhythm:Regular Rate:Normal     Neuro/Psych  Neuromuscular disease    GI/Hepatic Neg liver ROS, GERD  ,  Endo/Other  negative endocrine ROS  Renal/GU negative Renal ROS     Musculoskeletal   Abdominal   Peds  Hematology  (+) Sickle cell trait ,   Anesthesia Other Findings   Reproductive/Obstetrics                             Anesthesia Physical Anesthesia Plan  ASA: 3  Anesthesia Plan: General   Post-op Pain Management:    Induction: Intravenous  PONV Risk Score and Plan: 2 and Ondansetron, Dexamethasone and Midazolam  Airway Management Planned: Oral ETT  Additional Equipment:   Intra-op Plan:   Post-operative Plan: Extubation in OR  Informed Consent: I have reviewed the patients History and Physical, chart, labs and discussed the procedure including the risks, benefits and alternatives for the proposed anesthesia with the patient or authorized representative who has indicated his/her understanding and acceptance.     Dental advisory given  Plan Discussed with: CRNA and Anesthesiologist  Anesthesia Plan Comments:         Anesthesia Quick Evaluation

## 2020-12-12 NOTE — Transfer of Care (Addendum)
Immediate Anesthesia Transfer of Care Note  Patient: Amber Drake  Procedure(s) Performed: Laminectomy and Foraminotomy - left - L5-S1 (Left: Back)  Patient Location: PACU  Anesthesia Type:General  Level of Consciousness: awake and alert   Airway & Oxygen Therapy: Patient Spontanous Breathing and Patient connected to nasal cannula oxygen  Post-op Assessment: Report given to RN and Post -op Vital signs reviewed and stable  Post vital signs: Reviewed and stable  Last Vitals:  Vitals Value Taken Time  BP 145/77 12/12/20 1140  Temp    Pulse 84 12/12/20 1140  Resp 19 12/12/20 1140  SpO2 100 % 12/12/20 1140  Vitals shown include unvalidated device data.  Last Pain:  Vitals:   12/12/20 0801  TempSrc:   PainSc: 0-No pain         Complications: No notable events documented.

## 2020-12-12 NOTE — Progress Notes (Signed)
Patient is discharged from room 3C04 at this time. Alert and in stable condition. IV site d/c'd and instructions read to patient with understanding verbalized and all questions answered. Left unit via wheelchair with all belongings at side. 

## 2020-12-12 NOTE — Discharge Instructions (Signed)
Wound Care  Keep the incision clean and dry remove the outer dressing in 2 days, leave the Steri-Strips intact.  Do not put any creams, lotions, or ointments on incision. Leave steri-strips on back.  They will fall off by themselves.  Activity Walk each and every day, increasing distance each day. No lifting greater than 5 lbs.  No lifting no bending no twisting no driving or riding a car unless coming back and forth to see me. If provided with back brace, wear when out of bed.  It is not necessary to wear brace in bed. Diet Resume your normal diet.   Return to Work Will be discussed at you follow up appointment.  Call Your Doctor If Any of These Occur Redness, drainage, or swelling at the wound.  Temperature greater than 101 degrees. Severe pain not relieved by pain medication. Incision starts to come apart. Follow Up Appt Call today for appointment in 1-2 weeks [id number]) or for problems.  I

## 2020-12-12 NOTE — Op Note (Signed)
Lumbar spinal stenosis L5-S1 left with left S1 radiculopathy  Postoperative diagnosis: Same  Procedure: Decompressive laminectomy on the left at L5-S1 with microscopic foraminotomies of the left S1 nerve root  Surgeon: Dominica Severin Kamauri Kathol  Assistant: Nash Shearer  Anesthesia: General  EBL: Minimal  HPI: 60 year old female with left S1 radiculopathy and work-up revealing severe lateral recess stenosis at L5-S1 the left due to patient progression of clinical syndrome imaging findings and failed conservative treatment I recommended decompressive laminotomy and laminectomy at L5-S1 the left.  I extensively went over the risks and benefits of the operation with her as well as perioperative course expectations of outcome and alternatives of surgery and she understood and agreed to proceed forward.  Operative procedure: Patient was brought into the OR was Duson general anesthesia positioned prone the Wilson frame her back was prepped and draped in routine sterile fashion preoperative x-ray localized the appropriate level so after infiltration of 10 cc lidocaine with epi midline incision was made and Bovie electrocautery was used to take down the subcutaneous tissue and subperiosteal dissection was carried lamina of L5 and S1 on the left.  Intraoperative x-ray confirmed identification appropriate level so utilizing high-speed drill the inferior aspect of lamina L5 medial facet complex and superior aspect of lamina S1 was drilled down laminotomy was begun with a 3 mm Kerrison punch.  Ligament flow was identified noting markedly hypertrophied and removed in piecemeal fashion exposing thecal sac.  At this point the operating microscope was draped and brought into the field under microscope illumination further identification of the left S1 nerve root allowed unroofing of the left S1 foramen under biting the medial facet complex decompressed the lateral aspect the thecal sac and S1 nerve in the S1 foramen.  At the end  of decompression there is no further stenosis I did inspect the disc base it was felt not to be significantly bulging or compressive so I did not cut into it.  Wounds are copiously irrigated and meticulous hemostasis was maintained Gelfoam was ON top of the dura the muscle fascia approximate layers after Vicryl skin was closed running 4 subcuticular Dermabond benzoin Steri-Strips and a sterile dressing was applied patient recovery room in stable condition.  At the end the case all needle count sponge counts were correct.

## 2020-12-12 NOTE — Anesthesia Postprocedure Evaluation (Signed)
Anesthesia Post Note  Patient: Amber Drake  Procedure(s) Performed: Laminectomy and Foraminotomy - left - L5-S1 (Left: Back)     Patient location during evaluation: PACU Anesthesia Type: General Level of consciousness: awake Pain management: pain level controlled Vital Signs Assessment: post-procedure vital signs reviewed and stable Respiratory status: spontaneous breathing Cardiovascular status: stable Postop Assessment: no apparent nausea or vomiting Anesthetic complications: no   No notable events documented.  Last Vitals:  Vitals:   12/12/20 0750 12/12/20 1140  BP: 118/64 (!) 145/77  Pulse: 62 93  Resp: 17 (!) 21  Temp: 37.1 C 36.8 C  SpO2: 100% 100%    Last Pain:  Vitals:   12/12/20 1140  TempSrc:   PainSc: 8       LLE Sensation: Full sensation (12/12/20 1140)   RLE Sensation: Full sensation (12/12/20 1140)      Shakelia Scrivner

## 2020-12-12 NOTE — Anesthesia Procedure Notes (Signed)
Procedure Name: Intubation Date/Time: 12/12/2020 10:14 AM Performed by: Valda Favia, CRNA Pre-anesthesia Checklist: Patient identified, Emergency Drugs available, Suction available and Patient being monitored Patient Re-evaluated:Patient Re-evaluated prior to induction Oxygen Delivery Method: Circle System Utilized Preoxygenation: Pre-oxygenation with 100% oxygen Induction Type: IV induction Ventilation: Mask ventilation without difficulty Laryngoscope Size: Mac and 4 Grade View: Grade I Tube type: Oral Tube size: 7.0 mm Number of attempts: 1 Airway Equipment and Method: Stylet and Oral airway Placement Confirmation: ETT inserted through vocal cords under direct vision, positive ETCO2 and breath sounds checked- equal and bilateral Secured at: 22 cm Tube secured with: Tape Dental Injury: Teeth and Oropharynx as per pre-operative assessment

## 2020-12-13 ENCOUNTER — Encounter (HOSPITAL_COMMUNITY): Payer: Self-pay | Admitting: Neurosurgery

## 2020-12-17 ENCOUNTER — Other Ambulatory Visit (INDEPENDENT_AMBULATORY_CARE_PROVIDER_SITE_OTHER): Payer: Self-pay | Admitting: Gastroenterology

## 2021-01-31 ENCOUNTER — Inpatient Hospital Stay: Payer: Commercial Managed Care - PPO | Attending: Hematology

## 2021-01-31 ENCOUNTER — Inpatient Hospital Stay (HOSPITAL_BASED_OUTPATIENT_CLINIC_OR_DEPARTMENT_OTHER): Payer: Commercial Managed Care - PPO | Admitting: Hematology

## 2021-01-31 ENCOUNTER — Other Ambulatory Visit: Payer: Self-pay

## 2021-01-31 VITALS — BP 139/72 | HR 68 | Temp 98.1°F | Resp 18 | Ht 67.0 in | Wt 172.3 lb

## 2021-01-31 VITALS — BP 126/71 | HR 69 | Temp 98.1°F | Resp 18

## 2021-01-31 DIAGNOSIS — Z5112 Encounter for antineoplastic immunotherapy: Secondary | ICD-10-CM | POA: Diagnosis not present

## 2021-01-31 DIAGNOSIS — C884 Extranodal marginal zone B-cell lymphoma of mucosa-associated lymphoid tissue [MALT-lymphoma]: Secondary | ICD-10-CM | POA: Diagnosis not present

## 2021-01-31 DIAGNOSIS — Z7189 Other specified counseling: Secondary | ICD-10-CM

## 2021-01-31 LAB — CMP (CANCER CENTER ONLY)
ALT: 10 U/L (ref 0–44)
AST: 17 U/L (ref 15–41)
Albumin: 3.7 g/dL (ref 3.5–5.0)
Alkaline Phosphatase: 68 U/L (ref 38–126)
Anion gap: 10 (ref 5–15)
BUN: 9 mg/dL (ref 6–20)
CO2: 26 mmol/L (ref 22–32)
Calcium: 9.2 mg/dL (ref 8.9–10.3)
Chloride: 106 mmol/L (ref 98–111)
Creatinine: 0.7 mg/dL (ref 0.44–1.00)
GFR, Estimated: >60 mL/min (ref >60)
Glucose, Bld: 113 mg/dL — ABNORMAL HIGH (ref 70–99)
Potassium: 3 mmol/L — ABNORMAL LOW (ref 3.5–5.1)
Sodium: 142 mmol/L (ref 135–145)
Total Bilirubin: 0.3 mg/dL (ref 0.3–1.2)
Total Protein: 6.6 g/dL (ref 6.5–8.1)

## 2021-01-31 LAB — CBC WITH DIFFERENTIAL/PLATELET
Abs Immature Granulocytes: 0.03 10*3/uL (ref 0.00–0.07)
Basophils Absolute: 0 10*3/uL (ref 0.0–0.1)
Basophils Relative: 0 %
Eosinophils Absolute: 0.1 10*3/uL (ref 0.0–0.5)
Eosinophils Relative: 1 %
HCT: 32.2 % — ABNORMAL LOW (ref 36.0–46.0)
Hemoglobin: 11.2 g/dL — ABNORMAL LOW (ref 12.0–15.0)
Immature Granulocytes: 0 %
Lymphocytes Relative: 24 %
Lymphs Abs: 1.8 10*3/uL (ref 0.7–4.0)
MCH: 28.6 pg (ref 26.0–34.0)
MCHC: 34.8 g/dL (ref 30.0–36.0)
MCV: 82.4 fL (ref 80.0–100.0)
Monocytes Absolute: 0.4 10*3/uL (ref 0.1–1.0)
Monocytes Relative: 6 %
Neutro Abs: 5.1 10*3/uL (ref 1.7–7.7)
Neutrophils Relative %: 69 %
Platelets: 437 10*3/uL — ABNORMAL HIGH (ref 150–400)
RBC: 3.91 MIL/uL (ref 3.87–5.11)
RDW: 12.5 % (ref 11.5–15.5)
WBC: 7.4 10*3/uL (ref 4.0–10.5)
nRBC: 0 % (ref 0.0–0.2)

## 2021-01-31 LAB — LACTATE DEHYDROGENASE: LDH: 175 U/L (ref 98–192)

## 2021-01-31 MED ORDER — FAMOTIDINE 20 MG IN NS 100 ML IVPB
20.0000 mg | Freq: Once | INTRAVENOUS | Status: AC
  Administered 2021-01-31: 20 mg via INTRAVENOUS

## 2021-01-31 MED ORDER — ACETAMINOPHEN 325 MG PO TABS
650.0000 mg | ORAL_TABLET | Freq: Once | ORAL | Status: AC
  Administered 2021-01-31: 650 mg via ORAL

## 2021-01-31 MED ORDER — SODIUM CHLORIDE 0.9 % IV SOLN
375.0000 mg/m2 | Freq: Once | INTRAVENOUS | Status: AC
  Administered 2021-01-31: 700 mg via INTRAVENOUS
  Filled 2021-01-31: qty 50

## 2021-01-31 MED ORDER — SODIUM CHLORIDE 0.9 % IV SOLN: Freq: Once | INTRAVENOUS | Status: AC

## 2021-01-31 MED ORDER — POTASSIUM CHLORIDE CRYS ER 20 MEQ PO TBCR: EXTENDED_RELEASE_TABLET | ORAL | 1 refills | Status: AC

## 2021-01-31 MED ORDER — SODIUM CHLORIDE 0.9 % IV SOLN
10.0000 mg | Freq: Once | INTRAVENOUS | Status: AC
  Administered 2021-01-31: 10 mg via INTRAVENOUS
  Filled 2021-01-31: qty 10

## 2021-01-31 MED ORDER — DIPHENHYDRAMINE HCL 25 MG PO CAPS
50.0000 mg | ORAL_CAPSULE | Freq: Once | ORAL | Status: AC
  Administered 2021-01-31: 50 mg via ORAL

## 2021-01-31 NOTE — Patient Instructions (Signed)
[facility]  Discharge Instructions: ?Thank you for choosing [facility] to provide your oncology and hematology care.  ? ?If you have a lab appointment with the Cancer Center, please go directly to the Cancer Center and check in at the registration area. ?  ?Wear comfortable clothing and clothing appropriate for easy access to any Portacath or PICC line.  ? ?We strive to give you quality time with your provider. You may need to reschedule your appointment if you arrive late (15 or more minutes).  Arriving late affects you and other patients whose appointments are after yours.  Also, if you miss three or more appointments without notifying the office, you may be dismissed from the clinic at the provider?s discretion.    ?  ?For prescription refill requests, have your pharmacy contact our office and allow 72 hours for refills to be completed.   ? ?Today you received the following chemotherapy and/or immunotherapy agents: Ruxience ?  ?To help prevent nausea and vomiting after your treatment, we encourage you to take your nausea medication as directed. ? ?BELOW ARE SYMPTOMS THAT SHOULD BE REPORTED IMMEDIATELY: ?*FEVER GREATER THAN 100.4 F (38 ?C) OR HIGHER ?*CHILLS OR SWEATING ?*NAUSEA AND VOMITING THAT IS NOT CONTROLLED WITH YOUR NAUSEA MEDICATION ?*UNUSUAL SHORTNESS OF BREATH ?*UNUSUAL BRUISING OR BLEEDING ?*URINARY PROBLEMS (pain or burning when urinating, or frequent urination) ?*BOWEL PROBLEMS (unusual diarrhea, constipation, pain near the anus) ?TENDERNESS IN MOUTH AND THROAT WITH OR WITHOUT PRESENCE OF ULCERS (sore throat, sores in mouth, or a toothache) ?UNUSUAL RASH, SWELLING OR PAIN  ?UNUSUAL VAGINAL DISCHARGE OR ITCHING  ? ?Items with * indicate a potential emergency and should be followed up as soon as possible or go to the Emergency Department if any problems should occur. ? ?Please show the CHEMOTHERAPY ALERT CARD or IMMUNOTHERAPY ALERT CARD at check-in to the  Emergency Department and triage nurse. ? ?Should you have questions after your visit or need to cancel or reschedule your appointment, please contact [facility]  Dept: [phone]  and follow the prompts.  Office hours are 8:00 a.m. to 4:30 p.m. Monday - Friday. Please note that voicemails left after 4:00 p.m. may not be returned until the following business day.  We are closed weekends and major holidays. You have access to a nurse at all times for urgent questions. Please call the main number to the clinic Dept: [phone] and follow the prompts. ? ? ?For any non-urgent questions, you may also contact your provider using MyChart. We now offer e-Visits for anyone 18 and older to request care online for non-urgent symptoms. For details visit [email or web address]. ?  ?Also download the MyChart app! Go to the app store, search "MyChart", open the app, select [facility], and log in with your MyChart username and password. ? ?Due to Covid, a mask is required upon entering the hospital/clinic. If you do not have a mask, one will be given to you upon arrival. For doctor visits, patients may have 1 support person aged 18 or older with them. For treatment visits, patients cannot have anyone with them due to current Covid guidelines and our immunocompromised population.  ? ?

## 2021-02-01 ENCOUNTER — Telehealth: Payer: Self-pay | Admitting: Hematology

## 2021-02-01 NOTE — Telephone Encounter (Signed)
Scheduled follow-up appointments per 11/1 los. Patient is aware. Mailed calendar.

## 2021-02-06 ENCOUNTER — Encounter: Payer: Self-pay | Admitting: Hematology

## 2021-02-06 NOTE — Progress Notes (Signed)
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 female returns today for management and evaluation of her CNS Extranodal Marginal Zone Lymphoma. The patient's last visit with [place] was about 2 months ago..  Patient notes she has been feeling well and has no acute new symptoms.  No fevers no chills no night sweats no unexpected weight loss. No infection issues, no fevers/chills/night sweats or unexpected weight loss. No headaches or neck pain. Labs reviewed today.  No reported toxicities from previous Rituxan treatment.   MEDICAL HISTORY: Past Medical History:  Diagnosis Date   Anemia    Arthritis    Blood dyscrasia    sickle cell trait   Cancer ([place])    lymphoma   Carbuncle and furuncle    Hypercholesterolemia    Hypertension    does not take meds   Papanicolaou smear of cervix with positive high risk human papilloma virus (HPV) test 08/23/2020   08/23/20    [place] per ASCCP guidelines, immediate risk of CIN 3+ is 4.1 %   Sickle cell trait ([facility])     ALLERGIES:  is allergic to penicillins, penicillins cross reactors, and tape.  MEDICATIONS:  Current Outpatient Medications  Medication Sig Dispense Refill   atorvastatin (LIPITOR) 20 MG tablet Take 1 tablet (20 mg total) by mouth daily. 90 tablet 2   ibuprofen (ADVIL) 800 MG tablet Take 1 tablet (800 mg total) by mouth every 8 (eight) hours as needed. 20 tablet 1   lisinopril-hydrochlorothiazide (PRINZIDE,ZESTORETIC) 20-12.5 MG tablet Take 1 tablet by mouth daily.     meclizine (ANTIVERT) 12.5 MG tablet Take 1 tablet (12.5 mg total) by mouth 3 (three) times daily as needed for dizziness. (Patient not taking: No sig reported) 8 tablet 0   Multiple Vitamin (MULTIVITAMIN WITH MINERALS) TABS  tablet Take 1 tablet by mouth daily.     naproxen (NAPROSYN) 500 MG tablet Take 1 tablet (500 mg total) by mouth 2 (two) times daily with a meal. (Patient not taking: Reported on 11/23/2020) 60 tablet 5   oxyCODONE (OXY IR/ROXICODONE) 5 MG immediate release tablet Take 5 mg by mouth every 6 (six) hours as needed for pain.     oxyCODONE 10 MG TABS Take 1 tablet (10 mg total) by mouth every 3 (three) hours as needed for severe pain ((score 7 to 10)). 30 tablet 0   potassium chloride SA (KLOR-CON) 20 MEQ tablet TAKE (1) TABLET BY MOUTH TWICE DAILY. 60 tablet 1   sulfamethoxazole-trimethoprim (BACTRIM DS) 800-160 MG tablet Take 1 tablet by mouth 2 (two) times daily. X 7 days 14 tablet 0   traMADol (ULTRAM) 50 MG tablet Take 50 mg by mouth every 4 (four) hours as needed for moderate pain.     No current facility-administered medications for this visit.   Facility-Administered Medications Ordered in Other Visits  Medication Dose Route Frequency Provider Last Rate Last Admin   acetaminophen (TYLENOL) tablet 500 mg  500 mg Oral Once Brunetta Genera, MD       And   oxyCODONE (Oxy IR/ROXICODONE) immediate release tablet 5 mg  5 mg Oral Once Brunetta Genera, MD       dexamethasone (DECADRON) 10 mg in sodium chloride 0.9 % 50 mL IVPB  10 mg Intravenous Once Brunetta Genera, MD  SURGICAL HISTORY:  Past Surgical History:  Procedure Laterality Date   APPENDECTOMY     APPLICATION OF CRANIAL NAVIGATION N/A 07/28/2018   Procedure: APPLICATION OF CRANIAL NAVIGATION;  Surgeon: Kary Kos, MD;  Location: [facility];  Service: Neurosurgery;  Laterality: N/A;   BRAIN SURGERY     CARPAL TUNNEL RELEASE  03/23/2011   Procedure: CARPAL TUNNEL RELEASE;  Surgeon: Sanjuana Kava;  Location: [facility];  Service: Orthopedics;  Laterality: Left;   CARPAL TUNNEL RELEASE  05/10/2011   Procedure: CARPAL TUNNEL RELEASE;  Surgeon: Sanjuana Kava, MD;  Location: [facility];  Service: Orthopedics;  Laterality: Right;    CESAREAN SECTION     x 2   COLONOSCOPY WITH PROPOFOL N/A 11/16/2020   Procedure: COLONOSCOPY WITH PROPOFOL;  Surgeon: Harvel Quale, MD;  [facility];  Service: Gastroenterology;  Laterality: N/A;  9:15   INCISION AND DRAINAGE PERIRECTAL ABSCESS  12/21/09   LUMBAR LAMINECTOMY/DECOMPRESSION MICRODISCECTOMY Left 12/12/2020   Procedure: Laminectomy and Foraminotomy - left - L5-S1;  Surgeon: Kary Kos, MD;  Location: [facility];  Service: Neurosurgery;  Laterality: Left;  3C   PR DURAL GRAFT REPAIR,SPINE DEFECT N/A 07/28/2018   Procedure: Stereotactic open biopsy of Right cerebellar hemisphere and dura with brainlab;  Surgeon: Kary Kos, MD;  Location: [facility];  Service: Neurosurgery;  Laterality: N/A;  Stereotactic open biopsy of Right cerebellar hemisphere and dura with brainlab   PROCTOSCOPY  10/17/2010   Procedure: PROCTOSCOPY;  Surgeon: Scherry Ran;  Location: [facility];  Service: General;  Laterality: N/A;  Rigid Proctoscopy/Possible Fistula in Ano  Procedure ended at [facility]     x2    REVIEW OF SYSTEMS:   .10 Point review of Systems was done is negative except as noted above.  PHYSICAL EXAMINATION:  Blood pressure 139/72, pulse 68, temperature 98.1 F (36.7 C), temperature source Tympanic, resp. rate 18, height 5\' 7"  (1.702 m), weight 172 lb 4.8 oz (78.2 kg), last menstrual period 08/12/2010, SpO2 100 %.  ECOG PERFORMANCE STATUS: 0-1 . GENERAL:alert, in no acute distress and comfortable SKIN: no acute rashes, no significant lesions EYES: conjunctiva are pink and non-injected, sclera anicteric OROPHARYNX: MMM, no exudates, no oropharyngeal erythema or ulceration NECK: supple, no JVD LYMPH:  no palpable lymphadenopathy in the cervical, axillary or inguinal regions LUNGS: clear to auscultation b/l with normal respiratory effort HEART: regular rate & rhythm ABDOMEN:  normoactive bowel sounds , non tender, not distended. Extremity: no pedal  edema PSYCH: alert & oriented x 3 with fluent speech NEURO: no focal motor/sensory deficits     LABORATORY DATA:  . CBC Latest Ref Rng & Units 01/31/2021 11/29/2020 11/28/2020  WBC 4.0 - 10.5 K/uL 7.4 7.3 6.4  Hemoglobin 12.0 - 15.0 g/dL 11.2(L) 12.0 12.1  Hematocrit 36.0 - 46.0 % 32.2(L) 35.3(L) 36.9  Platelets 150 - 400 K/uL 437(H) 349 390    . CMP Latest Ref Rng & Units 01/31/2021 11/29/2020 11/28/2020  Glucose 70 - 99 mg/dL 113(H) 89 85  BUN 6 - 20 mg/dL 9 17 14   Creatinine 0.44 - 1.00 mg/dL 0.70 0.81 0.74  Sodium 135 - 145 mmol/L 142 140 137  Potassium 3.5 - 5.1 mmol/L 3.0(L) 3.5 3.4(L)  Chloride 98 - 111 mmol/L 106 104 103  CO2 22 - 32 mmol/L 26 25 27   Calcium 8.9 - 10.3 mg/dL 9.2 9.7 9.8  Total Protein 6.5 - 8.1 g/dL 6.6 7.0 -  Total Bilirubin 0.3 - 1.2 mg/dL 0.3 0.3 -  Alkaline Phos 38 - 126 U/L 68 74 -  AST 15 - 41 U/L 17 22 -  ALT 0 - 44 U/L 10 18 -    . Lab Results  Component Value Date   LDH 175 01/31/2021    RADIOGRAPHIC STUDIES:  No results found.   ASSESSMENT/PLAN:  60 y.o. female with   1. 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 The process appears to extend into the upper cervical canal. Mild obstructive hydrocephalus. Differential includes tumor including metastatic disease and lymphoma. Chronic inflammatory process such as sarcoid is a consideration however chest x-ray negative. TB and atypical infection/fungus also possible. 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 2. Nonspecific focus of increased uptake within the cord extending from T12 to L1. Cannot rule out additional site of CNS lymphoma. Consider further evaluation with contrast enhanced MRI through this area. 3. Aortic Atherosclerosis and Emphysema."   10/16/2018 MRI brain w and w/o contrast revealed "1. Resolved dural mass in the right posterior fossa with minimal smooth dural thickening that may be treatment related. Cerebellar edema and mass effect is also resolved. No new site of disease. 2. 6 mm colloid cyst."   10/20/2018 MRI cervical spine w and w/o contrast revealed "Negative for lymphoma.  No acute abnormality. Central disc protrusion at C5-6 effaces the ventral thecal sac causing mild central canal narrowing. There is also a shallow disc bulge at C6-7 which narrows but does not efface the ventral thecal Sac."   01/13/2019 MRI Brain ([id number]) revealed "1. Stable and satisfactory post treatment appearance of the posterior fossa. 2. Stable small 6 mm colloid cyst. 3. No new intracranial abnormality."   05/06/2019 MRI Cervical Spine ([id number]) revealed "No evidence of lymphoma in the cervical spine. Previously noted dural enhancement in the posterior fossa and cervical canal has resolved. No cord compression or cord lesion. Chronic cervical spine degenerative changes are stable from the prior study."   12/15/2019 MRI Brain ([id number]) revealed "No acute intracranial abnormality. No enhancing mass lesion 5 mm colloid cyst in the third ventricle is chronic and unchanged from prior studies."  08/14/2020, MR Brain ([id number]) which revealed "1. Unchanged appearance of the brain. No evidence of recurrent intracranial lymphoma. 2. Small colloid cyst without  hydrocephalus."   PLAN: -patients labs reviewed with her today. Cbc, cmp and ldh stable and WNL, - no lab or clinic evidence of lymphoma progression at this time. --Will continue maintenance Rituxan q8weeks for up to three years. The pt has no prohibitive toxicities. -Continue to avoid Ibuprofen, use OTC Tylenol for pain management. -Advised pt we continue to monitor labs and symptoms for progression of disease. -Will see back as scheduled with her next cycle of Rituxan in 2 months.     FOLLOW UP: [place] today Please schedule next 3 cycles of maintenance Rituxan every 2 months with labs and MD visits  The total time spent in the appointment was 25 minutes and more than 50% was on counseling and direct patient cares. , Brunetta Genera MD

## 2021-02-13 ENCOUNTER — Encounter (HOSPITAL_BASED_OUTPATIENT_CLINIC_OR_DEPARTMENT_OTHER): Payer: Commercial Managed Care - PPO | Admitting: Internal Medicine

## 2021-03-01 ENCOUNTER — Telehealth: Payer: Self-pay | Admitting: *Deleted

## 2021-03-01 NOTE — Telephone Encounter (Signed)
[place] [phone] initiated; currently to provider in-basket for pick up and completion of form.    "Treating Source Statement/Narrative of Physical Conditions" not obtained by this nurse review.

## 2021-03-17 ENCOUNTER — Other Ambulatory Visit: Payer: Self-pay | Admitting: Student

## 2021-03-17 ENCOUNTER — Other Ambulatory Visit (HOSPITAL_COMMUNITY): Payer: Self-pay | Admitting: Student

## 2021-03-17 DIAGNOSIS — M5416 Radiculopathy, lumbar region: Secondary | ICD-10-CM

## 2021-03-21 ENCOUNTER — Encounter: Payer: Self-pay | Admitting: Orthopaedic Surgery

## 2021-03-21 ENCOUNTER — Other Ambulatory Visit: Payer: Self-pay

## 2021-03-21 ENCOUNTER — Ambulatory Visit: Payer: Commercial Managed Care - PPO

## 2021-03-21 ENCOUNTER — Ambulatory Visit (INDEPENDENT_AMBULATORY_CARE_PROVIDER_SITE_OTHER): Payer: Commercial Managed Care - PPO | Admitting: Orthopaedic Surgery

## 2021-03-21 VITALS — BP 139/68 | HR 85 | Ht 65.0 in | Wt 173.0 lb

## 2021-03-21 DIAGNOSIS — G8929 Other chronic pain: Secondary | ICD-10-CM

## 2021-03-21 DIAGNOSIS — M25511 Pain in right shoulder: Secondary | ICD-10-CM | POA: Diagnosis not present

## 2021-03-21 MED ORDER — NAPROXEN 500 MG PO TABS: 500.0000 mg | ORAL_TABLET | Freq: Two times a day (BID) | ORAL | 5 refills | Status: DC

## 2021-03-21 NOTE — Progress Notes (Signed)
My right shoulder is hurting.  She has seen neurosurgeon who asked that I see the patient for right shoulder pain.  She has been having pain in the right shoulder for about two months, getting worse.  She has no trauma, no weakness, no swelling.  She has pain lifting the arm overhead.  She has tried rest, heat, ice, rubs and Advil.  Examination of right Upper Extremity is done.  Inspection:   Overall:  Elbow non-tender without crepitus or defects, forearm non-tender without crepitus or defects, wrist non-tender without crepitus or defects, hand non-tender.    Shoulder: with glenohumeral joint tenderness, without effusion.   Upper arm:  without swelling and tenderness   Range of motion:   Overall:  Full range of motion of the elbow, full range of motion of wrist and full range of motion in fingers.   Shoulder:  right  150 degrees forward flexion; 125 degrees abduction; 30 degrees internal rotation, 30 degrees external rotation, 10 degrees extension, 40 degrees adduction.   Stability:   Overall:  Shoulder, elbow and wrist stable   Strength and Tone:   Overall full shoulder muscles strength, full upper arm strength and normal upper arm bulk and tone.   X-rays were done of the right shoulder, reported separately.  Encounter Diagnosis  Name Primary?   Chronic right shoulder pain Yes   PROCEDURE NOTE:  The patient request injection, verbal consent was obtained.  The right shoulder was prepped appropriately after time out was performed.   Sterile technique was observed and injection of 1 cc of DepoMedrol 40mg  with several cc's of plain xylocaine. Anesthesia was provided by ethyl chloride and a 20-gauge needle was used to inject the shoulder area. A posterior approach was used.  The injection was tolerated well.  A band aid dressing was applied.  The patient was advised to apply ice later today and tomorrow to the injection sight as needed.   I will renew Naprosyn.  Return in three  weeks.  Call if any problem.  Precautions discussed.  Electronically Signed Sanjuana Kava, MD 12/20/20228:46 AM

## 2021-03-21 NOTE — Patient Instructions (Signed)
Bursitis of Shoulder

## 2021-03-22 ENCOUNTER — Ambulatory Visit: Payer: Commercial Managed Care - PPO | Admitting: Orthopedic Surgery

## 2021-03-31 ENCOUNTER — Other Ambulatory Visit: Payer: Self-pay

## 2021-03-31 DIAGNOSIS — C884 Extranodal marginal zone B-cell lymphoma of mucosa-associated lymphoid tissue [MALT-lymphoma]: Secondary | ICD-10-CM

## 2021-04-02 ENCOUNTER — Encounter: Payer: Self-pay | Admitting: Hematology

## 2021-04-04 ENCOUNTER — Inpatient Hospital Stay (HOSPITAL_BASED_OUTPATIENT_CLINIC_OR_DEPARTMENT_OTHER): Payer: BC Managed Care – PPO | Admitting: Hematology

## 2021-04-04 ENCOUNTER — Inpatient Hospital Stay: Payer: BC Managed Care – PPO | Attending: Hematology

## 2021-04-04 ENCOUNTER — Inpatient Hospital Stay: Payer: BC Managed Care – PPO

## 2021-04-04 ENCOUNTER — Other Ambulatory Visit: Payer: Self-pay

## 2021-04-04 ENCOUNTER — Encounter: Payer: Self-pay | Admitting: Hematology

## 2021-04-04 VITALS — BP 128/82 | HR 65 | Temp 97.7°F | Resp 18 | Ht 65.0 in | Wt 170.5 lb

## 2021-04-04 VITALS — BP 109/54 | HR 82 | Temp 98.5°F | Resp 18

## 2021-04-04 DIAGNOSIS — Z5112 Encounter for antineoplastic immunotherapy: Secondary | ICD-10-CM | POA: Insufficient documentation

## 2021-04-04 DIAGNOSIS — C884 Extranodal marginal zone B-cell lymphoma of mucosa-associated lymphoid tissue [MALT-lymphoma]: Secondary | ICD-10-CM

## 2021-04-04 DIAGNOSIS — Z7189 Other specified counseling: Secondary | ICD-10-CM

## 2021-04-04 LAB — CMP (CANCER CENTER ONLY)
ALT: 14 U/L (ref 0–44)
AST: 15 U/L (ref 15–41)
Albumin: 4 g/dL (ref 3.5–5.0)
Alkaline Phosphatase: 74 U/L (ref 38–126)
Anion gap: 7 (ref 5–15)
BUN: 12 mg/dL (ref 6–20)
CO2: 29 mmol/L (ref 22–32)
Calcium: 9.8 mg/dL (ref 8.9–10.3)
Chloride: 104 mmol/L (ref 98–111)
Creatinine: 0.79 mg/dL (ref 0.44–1.00)
GFR, Estimated: >60 mL/min (ref >60)
Glucose, Bld: 107 mg/dL — ABNORMAL HIGH (ref 70–99)
Potassium: 3.7 mmol/L (ref 3.5–5.1)
Sodium: 140 mmol/L (ref 135–145)
Total Bilirubin: 0.3 mg/dL (ref 0.3–1.2)
Total Protein: 6.8 g/dL (ref 6.5–8.1)

## 2021-04-04 LAB — CBC WITH DIFFERENTIAL (CANCER CENTER ONLY)
Abs Immature Granulocytes: 0.02 10*3/uL (ref 0.00–0.07)
Basophils Absolute: 0 10*3/uL (ref 0.0–0.1)
Basophils Relative: 0 %
Eosinophils Absolute: 0.1 10*3/uL (ref 0.0–0.5)
Eosinophils Relative: 1 %
HCT: 38.8 % (ref 36.0–46.0)
Hemoglobin: 13 g/dL (ref 12.0–15.0)
Immature Granulocytes: 0 %
Lymphocytes Relative: 28 %
Lymphs Abs: 1.9 10*3/uL (ref 0.7–4.0)
MCH: 28.5 pg (ref 26.0–34.0)
MCHC: 33.5 g/dL (ref 30.0–36.0)
MCV: 85.1 fL (ref 80.0–100.0)
Monocytes Absolute: 0.5 10*3/uL (ref 0.1–1.0)
Monocytes Relative: 7 %
Neutro Abs: 4.1 10*3/uL (ref 1.7–7.7)
Neutrophils Relative %: 64 %
Platelet Count: 287 10*3/uL (ref 150–400)
RBC: 4.56 MIL/uL (ref 3.87–5.11)
RDW: 12.7 % (ref 11.5–15.5)
WBC Count: 6.6 10*3/uL (ref 4.0–10.5)
nRBC: 0 % (ref 0.0–0.2)

## 2021-04-04 LAB — LACTATE DEHYDROGENASE: LDH: 142 U/L (ref 98–192)

## 2021-04-04 MED ORDER — SODIUM CHLORIDE 0.9 % IV SOLN
375.0000 mg/m2 | Freq: Once | INTRAVENOUS | Status: AC
  Administered 2021-04-04: 700 mg via INTRAVENOUS
  Filled 2021-04-04: qty 50

## 2021-04-04 MED ORDER — ACETAMINOPHEN 325 MG PO TABS
650.0000 mg | ORAL_TABLET | Freq: Once | ORAL | Status: AC
  Administered 2021-04-04: 650 mg via ORAL
  Filled 2021-04-04: qty 2

## 2021-04-04 MED ORDER — DIPHENHYDRAMINE HCL 25 MG PO CAPS
50.0000 mg | ORAL_CAPSULE | Freq: Once | ORAL | Status: AC
  Administered 2021-04-04: 50 mg via ORAL
  Filled 2021-04-04: qty 2

## 2021-04-04 MED ORDER — SODIUM CHLORIDE 0.9 % IV SOLN
10.0000 mg | Freq: Once | INTRAVENOUS | Status: AC
  Administered 2021-04-04: 10 mg via INTRAVENOUS
  Filled 2021-04-04: qty 10

## 2021-04-04 MED ORDER — SODIUM CHLORIDE 0.9 % IV SOLN: Freq: Once | INTRAVENOUS | Status: AC

## 2021-04-04 MED ORDER — FAMOTIDINE 20 MG IN NS 100 ML IVPB
20.0000 mg | Freq: Once | INTRAVENOUS | Status: AC
  Administered 2021-04-04: 20 mg via INTRAVENOUS
  Filled 2021-04-04: qty 100

## 2021-04-04 NOTE — Patient Instructions (Signed)
[facility]  Discharge Instructions: Thank you for choosing [facility] to provide your oncology and hematology care.   If you have a lab appointment with the Cancer Center, please go directly to the Cancer Center and check in at the registration area.   Wear comfortable clothing and clothing appropriate for easy access to any Portacath or PICC line.   We strive to give you quality time with your provider. You may need to reschedule your appointment if you arrive late (15 or more minutes).  Arriving late affects you and other patients whose appointments are after yours.  Also, if you miss three or more appointments without notifying the office, you may be dismissed from the clinic at the provider's discretion.      For prescription refill requests, have your pharmacy contact our office and allow 72 hours for refills to be completed.    Today you received the following chemotherapy and/or immunotherapy agents Rituxan      To help prevent nausea and vomiting after your treatment, we encourage you to take your nausea medication as directed.  BELOW ARE SYMPTOMS THAT SHOULD BE REPORTED IMMEDIATELY: *FEVER GREATER THAN 100.4 F (38 C) OR HIGHER *CHILLS OR SWEATING *NAUSEA AND VOMITING THAT IS NOT CONTROLLED WITH YOUR NAUSEA MEDICATION *UNUSUAL SHORTNESS OF BREATH *UNUSUAL BRUISING OR BLEEDING *URINARY PROBLEMS (pain or burning when urinating, or frequent urination) *BOWEL PROBLEMS (unusual diarrhea, constipation, pain near the anus) TENDERNESS IN MOUTH AND THROAT WITH OR WITHOUT PRESENCE OF ULCERS (sore throat, sores in mouth, or a toothache) UNUSUAL RASH, SWELLING OR PAIN  UNUSUAL VAGINAL DISCHARGE OR ITCHING   Items with * indicate a potential emergency and should be followed up as soon as possible or go to the Emergency Department if any problems should occur.  Please show the CHEMOTHERAPY ALERT CARD or IMMUNOTHERAPY ALERT CARD at check-in to the  Emergency Department and triage nurse.  Should you have questions after your visit or need to cancel or reschedule your appointment, please contact  [facility]  Dept: [phone]  and follow the prompts.  Office hours are 8:00 a.m. to 4:30 p.m. Monday - Friday. Please note that voicemails left after 4:00 p.m. may not be returned until the following business day.  We are closed weekends and major holidays. You have access to a nurse at all times for urgent questions. Please call the main number to the clinic Dept: [phone] and follow the prompts.   For any non-urgent questions, you may also contact your provider using MyChart. We now offer e-Visits for anyone 18 and older to request care online for non-urgent symptoms. For details visit [email or web address].   Also download the MyChart app! Go to the app store, search "MyChart", open the app, select , and log in with your MyChart username and password.  Due to Covid, a mask is required upon entering the hospital/clinic. If you do not have a mask, one will be given to you upon arrival. For doctor visits, patients may have 1 support person aged 18 or older with them. For treatment visits, patients cannot have anyone with them due to current Covid guidelines and our immunocompromised population.   

## 2021-04-06 ENCOUNTER — Telehealth: Payer: Self-pay | Admitting: Hematology

## 2021-04-06 NOTE — Telephone Encounter (Signed)
Left message with follow-up appointment per 1/3 los.

## 2021-04-09 ENCOUNTER — Encounter: Payer: Self-pay | Admitting: Hematology

## 2021-04-09 NOTE — Progress Notes (Signed)
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 female returns for evaluation and management of meningeal extranodal marginal zone lymphoma and next dose of maintenance Rituxan. She notes no acute new symptoms since her last clinic visit. No fevers no chills no night sweats no new lumps or bumps. No headaches no new neck pains. No significant toxicities from her last dose of Rituxan. No infection issues.  MEDICAL HISTORY: Past Medical History:  Diagnosis Date   Anemia    Arthritis    Blood dyscrasia    sickle cell trait   Cancer ([place])    lymphoma   Carbuncle and furuncle    Hypercholesterolemia    Hypertension    does not take meds   Papanicolaou smear of cervix with positive high risk human papilloma virus (HPV) test 08/23/2020   08/23/20    [place] per ASCCP guidelines, immediate risk of CIN 3+ is 4.1 %   Sickle cell trait ([facility])     ALLERGIES:  is allergic to penicillins, penicillins cross reactors, and tape.  MEDICATIONS:  Current Outpatient Medications  Medication Sig Dispense Refill   atorvastatin (LIPITOR) 20 MG tablet Take 1 tablet (20 mg total) by mouth daily. 90 tablet 2   ibuprofen (ADVIL) 800 MG tablet Take 1 tablet (800 mg total) by mouth every 8 (eight) hours as needed. 20 tablet 1   lisinopril-hydrochlorothiazide (PRINZIDE,ZESTORETIC) 20-12.5 MG tablet Take 1 tablet by mouth daily.     Multiple Vitamin (MULTIVITAMIN WITH MINERALS) TABS tablet Take 1 tablet by mouth daily.     naproxen (NAPROSYN) 500 MG tablet Take 1 tablet (500 mg total) by mouth 2 (two) times daily with a meal. 60 tablet 5   oxyCODONE (OXY IR/ROXICODONE) 5 MG immediate release tablet Take 5 mg by mouth every 6 (six) hours as  needed for pain.     oxyCODONE 10 MG TABS Take 1 tablet (10 mg total) by mouth every 3 (three) hours as needed for severe pain ((score 7 to 10)). 30 tablet 0   potassium chloride SA (KLOR-CON) 20 MEQ tablet TAKE (1) TABLET BY MOUTH TWICE DAILY. 60 tablet 1   meclizine (ANTIVERT) 12.5 MG tablet Take 1 tablet (12.5 mg total) by mouth 3 (three) times daily as needed for dizziness. (Patient not taking: Reported on 11/14/2020) 8 tablet 0   traMADol (ULTRAM) 50 MG tablet Take 50 mg by mouth every 4 (four) hours as needed for moderate pain. (Patient not taking: Reported on 04/04/2021)     No current facility-administered medications for this visit.   Facility-Administered Medications Ordered in Other Visits  Medication Dose Route Frequency Provider Last Rate Last Admin   acetaminophen (TYLENOL) tablet 500 mg  500 mg Oral Once Brunetta Genera, MD       And   oxyCODONE (Oxy IR/ROXICODONE) immediate release tablet 5 mg  5 mg Oral Once Brunetta Genera, MD       dexamethasone (DECADRON) 10 mg in sodium chloride 0.9 % 50 mL IVPB  10 mg Intravenous Once Brunetta Genera, MD        SURGICAL HISTORY:  Past Surgical History:  Procedure Laterality Date   APPENDECTOMY     APPLICATION OF CRANIAL NAVIGATION N/A 07/28/2018   Procedure: APPLICATION OF CRANIAL NAVIGATION;  Surgeon: Kary Kos,  MD;  Location: [facility];  Service: Neurosurgery;  Laterality: N/A;   BRAIN SURGERY     CARPAL TUNNEL RELEASE  03/23/2011   Procedure: CARPAL TUNNEL RELEASE;  Surgeon: Sanjuana Kava;  Location: [facility];  Service: Orthopedics;  Laterality: Left;   CARPAL TUNNEL RELEASE  05/10/2011   Procedure: CARPAL TUNNEL RELEASE;  Surgeon: Sanjuana Kava, MD;  Location: [facility];  Service: Orthopedics;  Laterality: Right;   CESAREAN SECTION     x 2   COLONOSCOPY WITH PROPOFOL N/A 11/16/2020   Procedure: COLONOSCOPY WITH PROPOFOL;  Surgeon: Harvel Quale, MD;  [facility];  Service: Gastroenterology;  Laterality:  N/A;  9:15   INCISION AND DRAINAGE PERIRECTAL ABSCESS  12/21/09   LUMBAR LAMINECTOMY/DECOMPRESSION MICRODISCECTOMY Left 12/12/2020   Procedure: Laminectomy and Foraminotomy - left - L5-S1;  Surgeon: Kary Kos, MD;  Location: [facility];  Service: Neurosurgery;  Laterality: Left;  3C   PR DURAL GRAFT REPAIR,SPINE DEFECT N/A 07/28/2018   Procedure: Stereotactic open biopsy of Right cerebellar hemisphere and dura with brainlab;  Surgeon: Kary Kos, MD;  Location: [facility];  Service: Neurosurgery;  Laterality: N/A;  Stereotactic open biopsy of Right cerebellar hemisphere and dura with brainlab   PROCTOSCOPY  10/17/2010   Procedure: PROCTOSCOPY;  Surgeon: Scherry Ran;  Location: [facility];  Service: General;  Laterality: N/A;  Rigid Proctoscopy/Possible Fistula in Ano  Procedure ended at [facility]     x2    REVIEW OF SYSTEMS:   .10 Point review of Systems was done is negative except as noted above.   PHYSICAL EXAMINATION:  Blood pressure 128/82, pulse 65, temperature 97.7 F (36.5 C), temperature source Temporal, resp. rate 18, height 5\' 5"  (1.651 m), weight 170 lb 8 oz (77.3 kg), last menstrual period 08/12/2010, SpO2 100 %.  ECOG PERFORMANCE STATUS: 0 . GENERAL:alert, in no acute distress and comfortable SKIN: no acute rashes, no significant lesions EYES: conjunctiva are pink and non-injected, sclera anicteric OROPHARYNX: MMM, no exudates, no oropharyngeal erythema or ulceration NECK: supple, no JVD LYMPH:  no palpable lymphadenopathy in the cervical, axillary or inguinal regions LUNGS: clear to auscultation b/l with normal respiratory effort HEART: regular rate & rhythm ABDOMEN:  normoactive bowel sounds , non tender, not distended. Extremity: no pedal edema PSYCH: alert & oriented x 3 with fluent speech NEURO: no focal motor/sensory deficits  LABORATORY DATA:  CBC Latest Ref Rng & Units 04/04/2021 01/31/2021 11/29/2020  WBC 4.0 - 10.5 K/uL 6.6 7.4 7.3  Hemoglobin 12.0  - 15.0 g/dL 13.0 11.2(L) 12.0  Hematocrit 36.0 - 46.0 % 38.8 32.2(L) 35.3(L)  Platelets 150 - 400 K/uL 287 437(H) 349    . CMP Latest Ref Rng & Units 04/04/2021 01/31/2021 11/29/2020  Glucose 70 - 99 mg/dL 107(H) 113(H) 89  BUN 6 - 20 mg/dL 12 9 17   Creatinine 0.44 - 1.00 mg/dL 0.79 0.70 0.81  Sodium 135 - 145 mmol/L 140 142 140  Potassium 3.5 - 5.1 mmol/L 3.7 3.0(L) 3.5  Chloride 98 - 111 mmol/L 104 106 104  CO2 22 - 32 mmol/L 29 26 25   Calcium 8.9 - 10.3 mg/dL 9.8 9.2 9.7  Total Protein 6.5 - 8.1 g/dL 6.8 6.6 7.0  Total Bilirubin 0.3 - 1.2 mg/dL 0.3 0.3 0.3  Alkaline Phos 38 - 126 U/L 74 68 74  AST 15 - 41 U/L 15 17 22   ALT 0 - 44 U/L 14 10 18     . Lab Results  Component Value  Date   LDH 142 04/04/2021    RADIOGRAPHIC STUDIES:  DG Shoulder Right  Result Date: 03/21/2021 Clinical: right shoulder pain no trauma X-rays were done of the right shoulder, two views. The humeral head is well located within the glenoid area.  No fracture or loose body noted.  Bone quality is good. Apex of lung is clear. Impression:  Negative right shoulder, no acute findings. Electronically Signed Sanjuana Kava, MD 12/20/20228:41 AM     ASSESSMENT/PLAN:   61 y.o. female with   1. 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 The process appears to extend into the upper cervical canal. Mild obstructive hydrocephalus. Differential includes tumor including metastatic disease and lymphoma. Chronic inflammatory process such as sarcoid is a consideration however chest  x-ray negative. TB and atypical infection/fungus also possible. 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 2. Nonspecific focus of increased uptake within the cord extending from T12 to L1. Cannot rule out additional site of CNS lymphoma. Consider further evaluation with contrast enhanced MRI through this area. 3. Aortic Atherosclerosis and Emphysema."   10/16/2018 MRI brain w and w/o contrast revealed "1. Resolved dural mass in the right posterior fossa with minimal smooth dural thickening that may be treatment related. Cerebellar edema and mass effect is also resolved. No new site of disease. 2. 6 mm colloid cyst."   10/20/2018 MRI cervical spine w and w/o contrast revealed "Negative for lymphoma.  No acute abnormality. Central disc protrusion at C5-6 effaces the ventral thecal sac causing mild central canal narrowing. There is also a shallow disc bulge at C6-7 which narrows but does not efface the ventral thecal Sac."   01/13/2019 MRI Brain ([id number]) revealed "1. Stable and satisfactory post treatment appearance of the posterior fossa. 2. Stable small 6 mm colloid cyst. 3. No new intracranial abnormality."   05/06/2019 MRI Cervical Spine ([id number]) revealed "No evidence of lymphoma in the cervical spine. Previously noted dural enhancement in the posterior fossa and cervical canal has resolved. No cord compression or cord lesion. Chronic cervical spine degenerative changes are stable from the prior study."   12/15/2019 MRI Brain ([id number]) revealed "No acute intracranial abnormality. No enhancing mass lesion 5 mm colloid cyst in the third ventricle is chronic and unchanged from prior studies."  08/14/2020, MR Brain  ([id number]) which revealed "1. Unchanged appearance of the brain. No evidence of recurrent intracranial lymphoma. 2. Small colloid cyst without hydrocephalus."   PLAN: -Patient's labs done today were reviewed with her in detail.  CBC within normal limits, CMP unremarkable, LDH within normal limits. -Patient has no new prohibitive toxicities from her maintenance Rituxan at this time. -Patient appropriate for next cycle of maintenance Rituxan today. -No clinical or lab evidence of lymphoma progression at this time. -Her last MRI was in May 2022.  We should get a follow-up imaging study with MRI of the brain  in the next 6 weeks -After the patient completes 3 years of maintenance Rituxan will need to determine further treatment and will connect with my colleagues at other institutions to get their opinion since there is no clear guideline regarding the situation.   FOLLOW UP: Please schedule next 3 cycles of maintenance Rituxan every 2 months with labs and MD visits MRI brain in 6 weeks  .Brunetta Genera MD

## 2021-04-11 ENCOUNTER — Ambulatory Visit (INDEPENDENT_AMBULATORY_CARE_PROVIDER_SITE_OTHER): Payer: BC Managed Care – PPO | Admitting: Orthopaedic Surgery

## 2021-04-11 ENCOUNTER — Encounter: Payer: Self-pay | Admitting: Orthopaedic Surgery

## 2021-04-11 ENCOUNTER — Other Ambulatory Visit: Payer: Self-pay

## 2021-04-11 VITALS — BP 127/70 | HR 81 | Ht 64.0 in | Wt 170.0 lb

## 2021-04-11 DIAGNOSIS — G8929 Other chronic pain: Secondary | ICD-10-CM | POA: Diagnosis not present

## 2021-04-11 DIAGNOSIS — F17210 Nicotine dependence, cigarettes, uncomplicated: Secondary | ICD-10-CM

## 2021-04-11 DIAGNOSIS — M25511 Pain in right shoulder: Secondary | ICD-10-CM | POA: Diagnosis not present

## 2021-04-11 NOTE — Patient Instructions (Signed)
While we are working on your approval for MRI please go ahead and call to schedule your appointment with [facility] within at least one (1) week.  [facility] [phone]   Bring CD of the images with you to review when you return

## 2021-04-11 NOTE — Progress Notes (Signed)
My shoulder is no better.  She has increased pain to the right shoulder.  The injection and the Naprosyn have not helped.  She has pain with overhead use.  I will get a MRI as she has not responded to conservative treatment.  Examination of right Upper Extremity is done.  Inspection:   Overall:  Elbow non-tender without crepitus or defects, forearm non-tender without crepitus or defects, wrist non-tender without crepitus or defects, hand non-tender.    Shoulder: with glenohumeral joint tenderness, without effusion.   Upper arm:  without swelling and tenderness   Range of motion:   Overall:  Full range of motion of the elbow, full range of motion of wrist and full range of motion in fingers.   Shoulder:  right  140 degrees forward flexion; 120 degrees abduction; 20 degrees internal rotation, 20 degrees external rotation, 5 degrees extension, 40 degrees adduction.   Stability:   Overall:  Shoulder, elbow and wrist stable   Strength and Tone:   Overall full shoulder muscles strength, full upper arm strength and normal upper arm bulk and tone.   Encounter Diagnoses  Name Primary?   Chronic right shoulder pain Yes   Nicotine dependence, cigarettes, uncomplicated    Get MRI of the right shoulder. I am concerned about rotator cuff tear.  Call if any problem.  Precautions discussed.  Electronically Signed Sanjuana Kava, MD 1/10/202310:32 AM

## 2021-04-12 ENCOUNTER — Telehealth: Payer: Self-pay | Admitting: Orthopaedic Surgery

## 2021-04-12 NOTE — Telephone Encounter (Signed)
Patient called about her MRI stating she has called the place and they don't have a authorization for it.   Please call the patient back

## 2021-04-13 NOTE — Telephone Encounter (Signed)
I s/w patient and I have started prior auth requests with her primary and secondary insurances. Pending.

## 2021-04-18 ENCOUNTER — Other Ambulatory Visit: Payer: Self-pay | Admitting: Orthopaedic Surgery

## 2021-04-18 DIAGNOSIS — G8929 Other chronic pain: Secondary | ICD-10-CM

## 2021-04-20 ENCOUNTER — Other Ambulatory Visit: Payer: Self-pay

## 2021-04-20 ENCOUNTER — Emergency Department (HOSPITAL_COMMUNITY)
Admission: EM | Admit: 2021-04-20 | Discharge: 2021-04-21 | Disposition: A | Discharge status: Home / Self Care | Payer: BC Managed Care – PPO | Attending: Emergency Medicine | Admitting: Emergency Medicine

## 2021-04-20 ENCOUNTER — Emergency Department (HOSPITAL_COMMUNITY): Payer: BC Managed Care – PPO

## 2021-04-20 ENCOUNTER — Telehealth: Payer: Self-pay | Admitting: Radiology

## 2021-04-20 ENCOUNTER — Encounter (HOSPITAL_COMMUNITY): Payer: Self-pay

## 2021-04-20 DIAGNOSIS — R1013 Epigastric pain: Secondary | ICD-10-CM | POA: Insufficient documentation

## 2021-04-20 DIAGNOSIS — R195 Other fecal abnormalities: Secondary | ICD-10-CM | POA: Diagnosis not present

## 2021-04-20 DIAGNOSIS — R109 Unspecified abdominal pain: Secondary | ICD-10-CM | POA: Diagnosis not present

## 2021-04-20 DIAGNOSIS — I700 Atherosclerosis of aorta: Secondary | ICD-10-CM | POA: Diagnosis not present

## 2021-04-20 LAB — COMPREHENSIVE METABOLIC PANEL
ALT: 17 U/L (ref 0–44)
AST: 17 U/L (ref 15–41)
Albumin: 3.6 g/dL (ref 3.5–5.0)
Alkaline Phosphatase: 60 U/L (ref 38–126)
Anion gap: 9 (ref 5–15)
BUN: 15 mg/dL (ref 6–20)
CO2: 26 mmol/L (ref 22–32)
Calcium: 9.3 mg/dL (ref 8.9–10.3)
Chloride: 106 mmol/L (ref 98–111)
Creatinine, Ser: 0.79 mg/dL (ref 0.44–1.00)
GFR, Estimated: >60 mL/min (ref >60)
Glucose, Bld: 109 mg/dL — ABNORMAL HIGH (ref 70–99)
Potassium: 3.3 mmol/L — ABNORMAL LOW (ref 3.5–5.1)
Sodium: 141 mmol/L (ref 135–145)
Total Bilirubin: 0.2 mg/dL — ABNORMAL LOW (ref 0.3–1.2)
Total Protein: 6.4 g/dL — ABNORMAL LOW (ref 6.5–8.1)

## 2021-04-20 LAB — CBC WITH DIFFERENTIAL/PLATELET
Abs Immature Granulocytes: 0.02 10*3/uL (ref 0.00–0.07)
Basophils Absolute: 0 10*3/uL (ref 0.0–0.1)
Basophils Relative: 0 %
Eosinophils Absolute: 0.1 10*3/uL (ref 0.0–0.5)
Eosinophils Relative: 1 %
HCT: 34.9 % — ABNORMAL LOW (ref 36.0–46.0)
Hemoglobin: 11.3 g/dL — ABNORMAL LOW (ref 12.0–15.0)
Immature Granulocytes: 0 %
Lymphocytes Relative: 38 %
Lymphs Abs: 2.3 10*3/uL (ref 0.7–4.0)
MCH: 28.4 pg (ref 26.0–34.0)
MCHC: 32.4 g/dL (ref 30.0–36.0)
MCV: 87.7 fL (ref 80.0–100.0)
Monocytes Absolute: 0.6 10*3/uL (ref 0.1–1.0)
Monocytes Relative: 9 %
Neutro Abs: 3.2 10*3/uL (ref 1.7–7.7)
Neutrophils Relative %: 52 %
Platelets: 323 10*3/uL (ref 150–400)
RBC: 3.98 MIL/uL (ref 3.87–5.11)
RDW: 12.9 % (ref 11.5–15.5)
WBC: 6.1 10*3/uL (ref 4.0–10.5)
nRBC: 0 % (ref 0.0–0.2)

## 2021-04-20 LAB — LIPASE, BLOOD: Lipase: 40 U/L (ref 11–51)

## 2021-04-20 IMAGING — CT CT ABD-PELV W/ CM
2 of 5 series · 16 of 46 positions shown, 18 images · IV contrast (Omnipaque or Isovue)
Comparison: [DATE]

CLINICAL DATA: Abdominal pain.

EXAM:
CT ABDOMEN AND PELVIS WITH CONTRAST
TECHNIQUE: Multidetector CT imaging of the abdomen and pelvis was performed
using the standard protocol following bolus administration of
intravenous contrast.

[Series 2: axial st · axial · 0.63mm/px · z∈[-469,-64]mm · 13 of 91 slices shown, 15 images]
[im 5/91  soft-tissue]
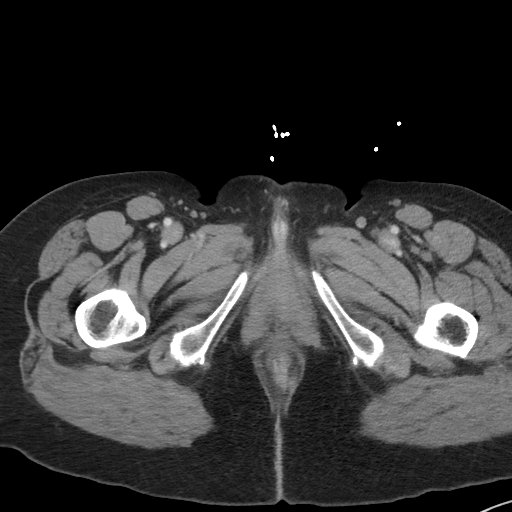
[im 5/91  bone]
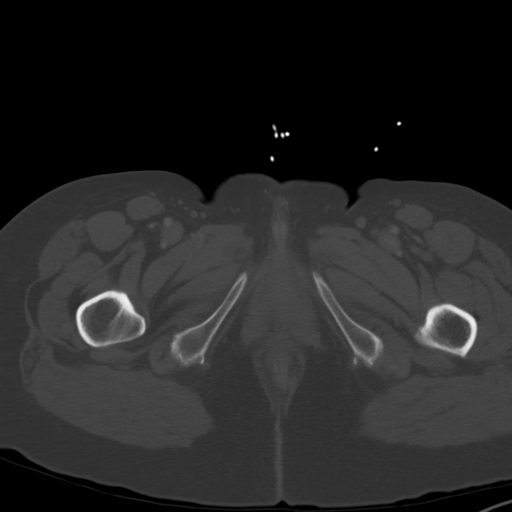
[im 14/91  soft-tissue]
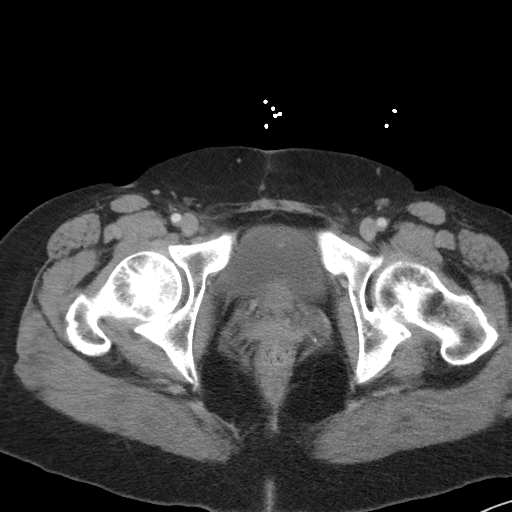
[im 19/91  soft-tissue]
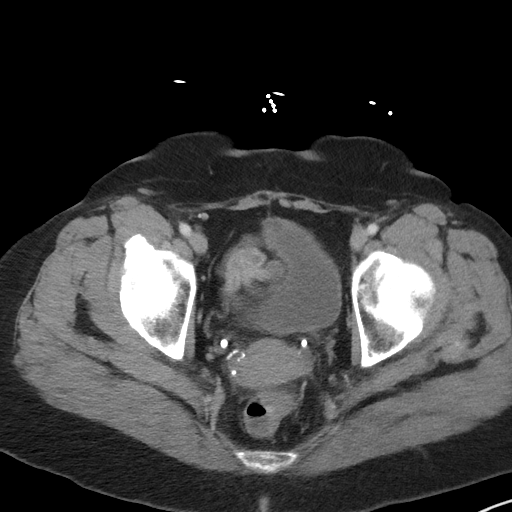
[im 28/91  soft-tissue]
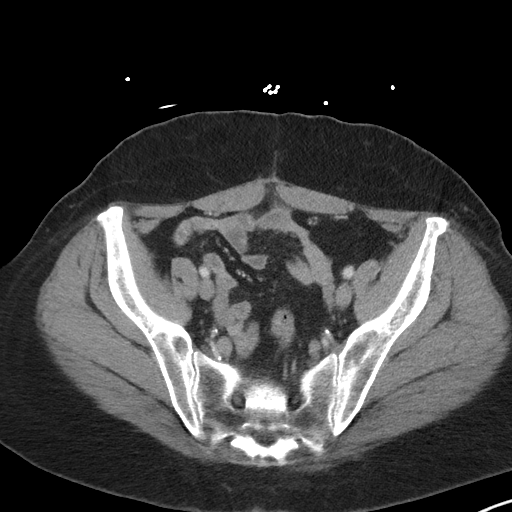
[im 32/91  soft-tissue]
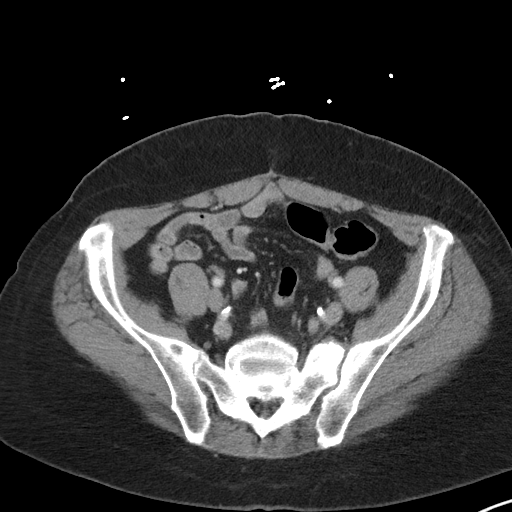
[im 41/91  soft-tissue]
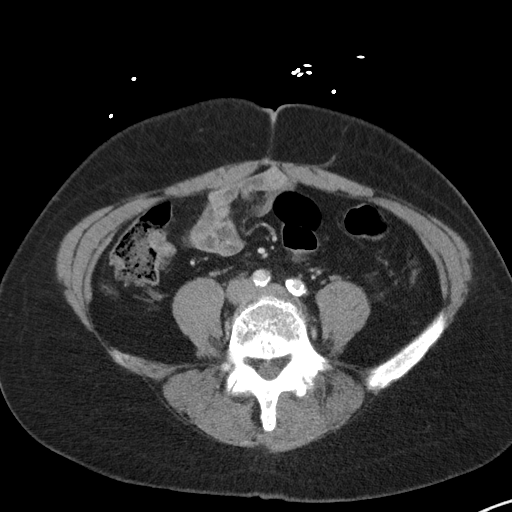
[im 46/91  soft-tissue]
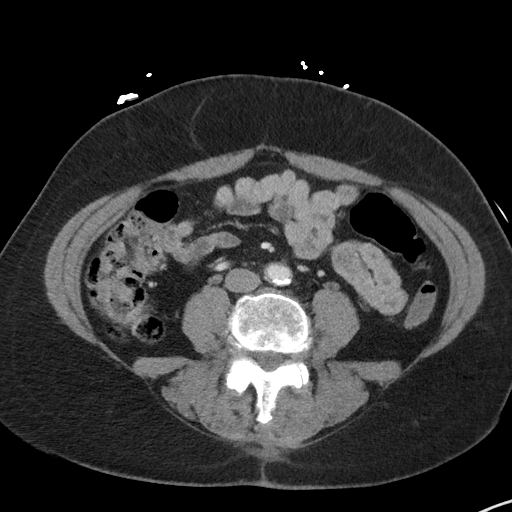
[im 50/91  soft-tissue]
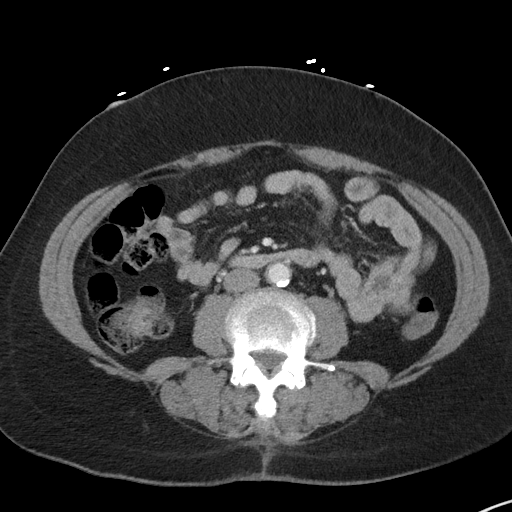
[im 59/91  soft-tissue]
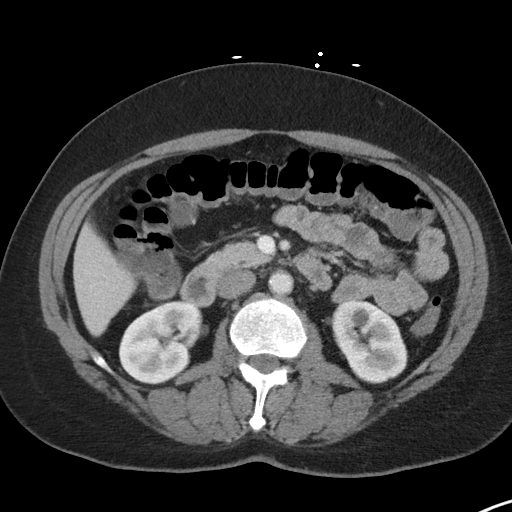
[im 59/91  bone]
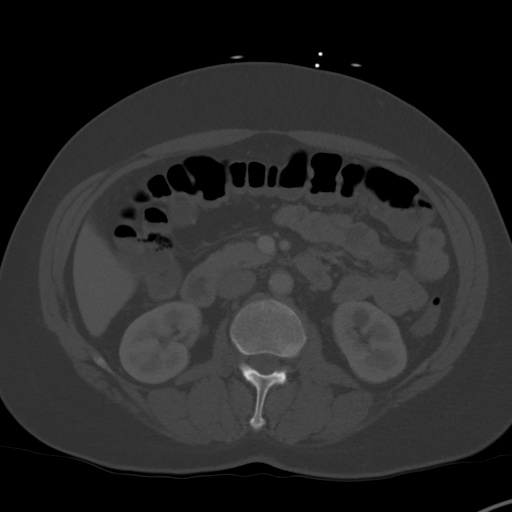
[im 64/91  soft-tissue]
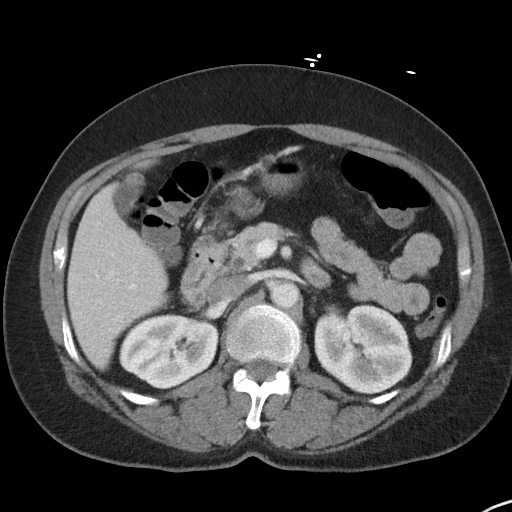
[im 73/91  soft-tissue]
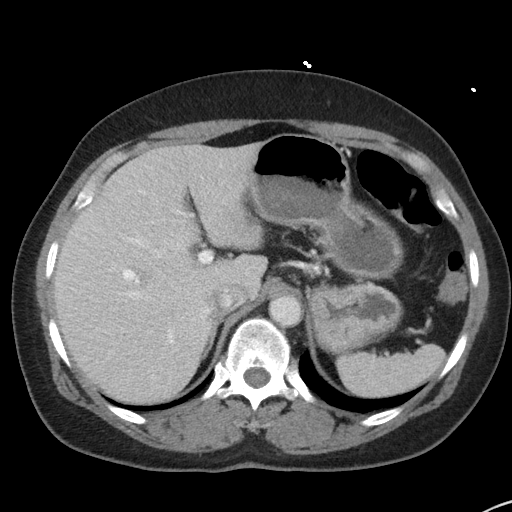
[im 77/91  soft-tissue]
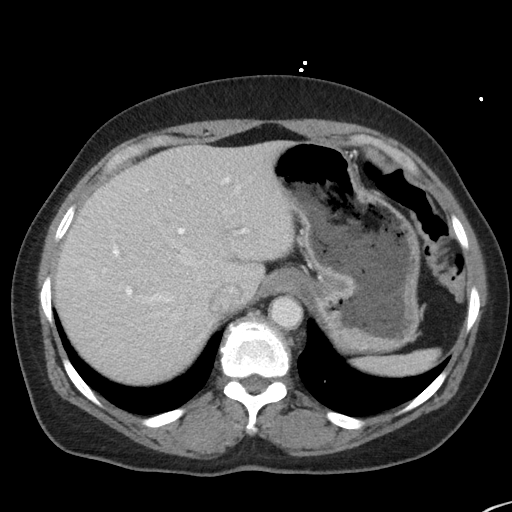
[im 86/91  soft-tissue]
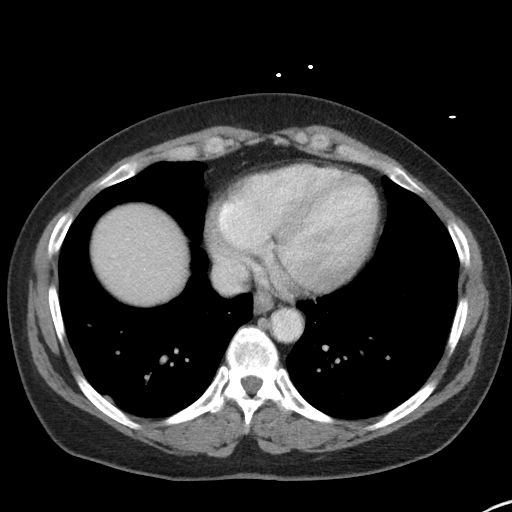

[Series 5: coronal st · coronal · 0.79mm/px · 3 of 89 slices shown]
[im 30/89  soft-tissue]
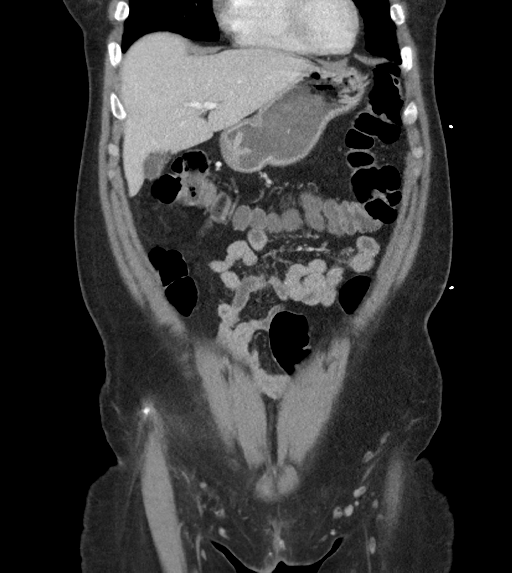
[im 40/89  soft-tissue]
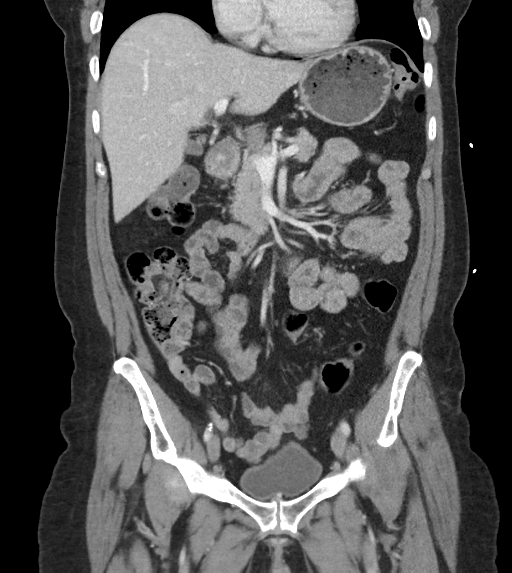
[im 49/89  soft-tissue]
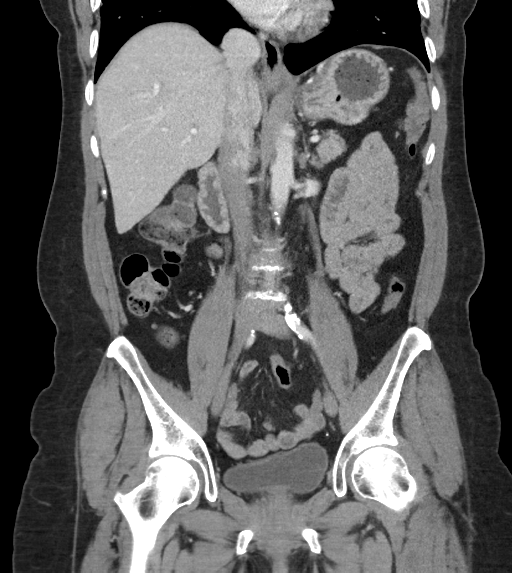

[16 of 46 positions shown; findings below may reference images not displayed]

RADIATION DOSE REDUCTION: This exam was performed according to the
departmental dose-optimization program which includes automated
exposure control, adjustment of the mA and/or kV according to
patient size and/or use of iterative reconstruction technique.

CONTRAST:  100mL OMNIPAQUE IOHEXOL 300 MG/ML  SOLN
FINDINGS: Lower chest: No acute abnormality.

Hepatobiliary: No focal liver abnormality is seen. No gallstones,
gallbladder wall thickening, or biliary dilatation.

Pancreas: Unremarkable. No pancreatic ductal dilatation or
surrounding inflammatory changes.

Spleen: Normal in size without focal abnormality.

Adrenals/Urinary Tract: Adrenal glands are unremarkable. Kidneys are
normal in size, without renal calculi or hydronephrosis. A 1.0 cm
diameter simple cyst is seen within the anterior aspect of the mid
left kidney. Bladder is unremarkable.

Stomach/Bowel: Stomach is within normal limits. The appendix is
surgically absent. No evidence of bowel wall thickening, distention,
or inflammatory changes.

Vascular/Lymphatic: Aortic atherosclerosis. No enlarged abdominal or
pelvic lymph nodes.

Reproductive: Uterus and bilateral adnexa are unremarkable.

Other: No abdominal wall hernia or abnormality. No abdominopelvic
ascites.

Musculoskeletal: No acute or significant osseous findings.
IMPRESSION: 1. No acute or active process within the abdomen or pelvis.
2. Small left renal cyst.
3. Aortic atherosclerosis.

Aortic Atherosclerosis ([0R]-[0R]).

## 2021-04-20 MED ORDER — IOHEXOL 300 MG/ML  SOLN
100.0000 mL | Freq: Once | INTRAMUSCULAR | Status: AC | PRN
  Administered 2021-04-20: 100 mL via INTRAVENOUS

## 2021-04-20 NOTE — ED Provider Notes (Addendum)
[facility] EMERGENCY DEPARTMENT Provider Note   CSN: [id number] Arrival date & time: 04/20/21  2142     History  Chief Complaint  Patient presents with   Abdominal Pain    X 2 days ago    Amber Drake is a 61 y.o. female.  Ms. Amber Drake is a 61 y/o female with a h/o large B cell lymphoma who presents to the ED c/o epigastric pain x3 days that comes & goes. States she was constipated, thus took laxatives for 2 days. Had BM today, which she states was loose. Denies N/V, dysuria, increased frequency/urgency, fever, chest pain, SOB. States she typically has difficulty with her bowel movements. Pt has h/o C-section. Last colonoscopy was in 08/22 w/ normal findings.   The history is provided by the patient. No language interpreter was used.  Abdominal Pain Pain location:  Generalized Pain quality: aching   Pain radiates to:  Does not radiate Pain severity:  Moderate Onset quality:  Gradual Duration:  2 days Timing:  Constant Progression:  Worsening Chronicity:  New Context: not sick contacts   Relieved by:  Nothing Worsened by:  Nothing Ineffective treatments:  None tried Associated symptoms: nausea   Risk factors: multiple surgeries       Home Medications Prior to Admission medications   Medication Sig Start Date End Date Taking? Authorizing Provider  atorvastatin (LIPITOR) 20 MG tablet Take 1 tablet (20 mg total) by mouth daily. 03/14/15   Soyla Dryer, PA-C  ibuprofen (ADVIL) 800 MG tablet Take 1 tablet (800 mg total) by mouth every 8 (eight) hours as needed. 10/13/19   Milton Ferguson, MD  lisinopril-hydrochlorothiazide (PRINZIDE,ZESTORETIC) 20-12.5 MG tablet Take 1 tablet by mouth daily.    [provider]  meclizine (ANTIVERT) 12.5 MG tablet Take 1 tablet (12.5 mg total) by mouth 3 (three) times daily as needed for dizziness. 09/25/20   Davonna Belling, MD  Multiple Vitamin (MULTIVITAMIN WITH MINERALS) TABS tablet Take 1 tablet by mouth daily.     [provider]  naproxen (NAPROSYN) 500 MG tablet Take 1 tablet (500 mg total) by mouth 2 (two) times daily with a meal. 03/21/21   Sanjuana Kava, MD  oxyCODONE (OXY IR/ROXICODONE) 5 MG immediate release tablet Take 5 mg by mouth every 6 (six) hours as needed for pain. 11/17/20   [provider]  oxyCODONE 10 MG TABS Take 1 tablet (10 mg total) by mouth every 3 (three) hours as needed for severe pain ((score 7 to 10)). 12/12/20   Kary Kos, MD  potassium chloride SA (KLOR-CON) 20 MEQ tablet TAKE (1) TABLET BY MOUTH TWICE DAILY. 01/31/21   Brunetta Genera, MD  traMADol (ULTRAM) 50 MG tablet Take 50 mg by mouth every 4 (four) hours as needed for moderate pain. 11/01/20   [provider]      Allergies    Penicillins, Penicillins cross reactors, and Tape    Review of Systems   Review of Systems  Gastrointestinal:  Positive for abdominal pain and nausea.  All other systems reviewed and are negative.  Physical Exam Updated Vital Signs BP 129/70 (BP Location: Right Arm)    Pulse 73    Temp 98.1 F (36.7 C) (Oral)    Resp 20    Ht 5\' 7"  (1.702 m)    Wt 77.1 kg    LMP 08/12/2010    SpO2 98%    BMI 26.63 kg/m  Physical Exam Vitals and nursing note reviewed.  Constitutional:  Appearance: She is well-developed.  HENT:     Head: Normocephalic.  Cardiovascular:     Rate and Rhythm: Normal rate and regular rhythm.  Pulmonary:     Effort: Pulmonary effort is normal.  Abdominal:     General: Bowel sounds are normal. There is no distension.     Palpations: Abdomen is soft.     Tenderness: There is abdominal tenderness in the epigastric area.  Musculoskeletal:        General: Normal range of motion.     Cervical back: Normal range of motion.  Skin:    General: Skin is warm.  Neurological:     General: No focal deficit present.     Mental Status: She is alert and oriented to person, place, and time.  Psychiatric:        Mood and Affect: Mood normal.    ED  Results / Procedures / Treatments   Labs (all labs ordered are listed, but only abnormal results are displayed) Labs Reviewed  CBC WITH DIFFERENTIAL/PLATELET  COMPREHENSIVE METABOLIC PANEL  LIPASE, BLOOD  URINALYSIS, ROUTINE W REFLEX MICROSCOPIC    EKG None  Radiology No results found.  Procedures Procedures    Medications Ordered in ED Medications - No data to display  ED Course/ Medical Decision Making/ A&P                           Medical Decision Making Amount and/or Complexity of Data Reviewed Labs: ordered. Radiology: ordered.  Risk Prescription drug management.         Pt's care turned over to Dr. Stark Jock  Ct and labs pending  Final Clinical Impression(s) / ED Diagnoses Final diagnoses:  Epigastric pain    Rx / DC Orders ED Discharge Orders     None         Fransico Meadow, Hershal Coria 04/20/21 2247    Fransico Meadow, PA-C 04/21/21 2139    Sidney Ace 04/21/21 2140    Noemi Chapel, MD 04/21/21 2142

## 2021-04-20 NOTE — ED Triage Notes (Signed)
Pt presents to ED from home with c/o abd pain that started two days ago, pain has been off and on, pt took laxative yesterday and today, pt had BM today.

## 2021-04-20 NOTE — Telephone Encounter (Signed)
IC patient and LM advising no Rx called in.   I called [employer] to set up P2P- [phone], scheduled Tues Jan 24th at 11 am with Dr Fernande Bras. (Pronounced EED)

## 2021-04-20 NOTE — ED Provider Notes (Incomplete Revision)
[facility] EMERGENCY DEPARTMENT Provider Note   CSN: [id number] Arrival date & time: 04/20/21  2142     History  Chief Complaint  Patient presents with   Abdominal Pain    X 2 days ago    Amber Drake is a 61 y.o. female.  Amber Drake is a 61 y/o female with a h/o large B cell lymphoma who presents to the ED c/o epigastric pain x3 days that comes & goes. States she was constipated, thus took laxatives for 2 days. Had BM today, which she states was loose. Denies N/V, dysuria, increased frequency/urgency, fever, chest pain, SOB. States she typically has difficulty with her bowel movements. Pt has h/o C-section. Last colonoscopy was in 08/22 w/ normal findings.   The history is provided by the patient. No language interpreter was used.  Abdominal Pain Pain location:  Generalized Pain quality: aching   Pain radiates to:  Does not radiate Pain severity:  Moderate Onset quality:  Gradual Duration:  2 days Timing:  Constant Progression:  Worsening Chronicity:  New Context: not sick contacts   Relieved by:  Nothing Worsened by:  Nothing Ineffective treatments:  None tried Associated symptoms: nausea   Risk factors: multiple surgeries       Home Medications Prior to Admission medications   Medication Sig Start Date End Date Taking? Authorizing Provider  atorvastatin (LIPITOR) 20 MG tablet Take 1 tablet (20 mg total) by mouth daily. 03/14/15   Soyla Dryer, PA-C  ibuprofen (ADVIL) 800 MG tablet Take 1 tablet (800 mg total) by mouth every 8 (eight) hours as needed. 10/13/19   Milton Ferguson, MD  lisinopril-hydrochlorothiazide (PRINZIDE,ZESTORETIC) 20-12.5 MG tablet Take 1 tablet by mouth daily.    [provider]  meclizine (ANTIVERT) 12.5 MG tablet Take 1 tablet (12.5 mg total) by mouth 3 (three) times daily as needed for dizziness. 09/25/20   Davonna Belling, MD  Multiple Vitamin (MULTIVITAMIN WITH MINERALS) TABS tablet Take 1 tablet by mouth daily.     [provider]  naproxen (NAPROSYN) 500 MG tablet Take 1 tablet (500 mg total) by mouth 2 (two) times daily with a meal. 03/21/21   Sanjuana Kava, MD  oxyCODONE (OXY IR/ROXICODONE) 5 MG immediate release tablet Take 5 mg by mouth every 6 (six) hours as needed for pain. 11/17/20   [provider]  oxyCODONE 10 MG TABS Take 1 tablet (10 mg total) by mouth every 3 (three) hours as needed for severe pain ((score 7 to 10)). 12/12/20   Kary Kos, MD  potassium chloride SA (KLOR-CON) 20 MEQ tablet TAKE (1) TABLET BY MOUTH TWICE DAILY. 01/31/21   Brunetta Genera, MD  traMADol (ULTRAM) 50 MG tablet Take 50 mg by mouth every 4 (four) hours as needed for moderate pain. 11/01/20   [provider]      Allergies    Penicillins, Penicillins cross reactors, and Tape    Review of Systems   Review of Systems  Gastrointestinal:  Positive for abdominal pain and nausea.  All other systems reviewed and are negative.  Physical Exam Updated Vital Signs BP 129/70 (BP Location: Right Arm)    Pulse 73    Temp 98.1 F (36.7 C) (Oral)    Resp 20    Ht 5\' 7"  (1.702 m)    Wt 77.1 kg    LMP 08/12/2010    SpO2 98%    BMI 26.63 kg/m  Physical Exam Vitals and nursing note reviewed.  Constitutional:  Appearance: She is well-developed.  HENT:     Head: Normocephalic.  Cardiovascular:     Rate and Rhythm: Normal rate and regular rhythm.  Pulmonary:     Effort: Pulmonary effort is normal.  Abdominal:     General: Bowel sounds are normal. There is no distension.     Palpations: Abdomen is soft.     Tenderness: There is abdominal tenderness in the epigastric area.  Musculoskeletal:        General: Normal range of motion.     Cervical back: Normal range of motion.  Skin:    General: Skin is warm.  Neurological:     General: No focal deficit present.     Mental Status: She is alert and oriented to person, place, and time.  Psychiatric:        Mood and Affect: Mood normal.    ED  Results / Procedures / Treatments   Labs (all labs ordered are listed, but only abnormal results are displayed) Labs Reviewed  CBC WITH DIFFERENTIAL/PLATELET  COMPREHENSIVE METABOLIC PANEL  LIPASE, BLOOD  URINALYSIS, ROUTINE W REFLEX MICROSCOPIC    EKG None  Radiology No results found.  Procedures Procedures    Medications Ordered in ED Medications - No data to display  ED Course/ Medical Decision Making/ A&P                           Medical Decision Making Amount and/or Complexity of Data Reviewed Labs: ordered. Radiology: ordered.           Final Clinical Impression(s) / ED Diagnoses Final diagnoses:  None    Rx / DC Orders ED Discharge Orders     None         Fransico Meadow, [place] 04/20/21 2247

## 2021-04-20 NOTE — Telephone Encounter (Signed)
Patient is asking for medication for pain.  MRI denied by secondary ins, will set up P2P.  Please advise on medication.

## 2021-04-21 LAB — URINALYSIS, ROUTINE W REFLEX MICROSCOPIC
Bilirubin Urine: NEGATIVE
Glucose, UA: NEGATIVE mg/dL
Ketones, ur: NEGATIVE mg/dL
Leukocytes,Ua: NEGATIVE
Nitrite: NEGATIVE
Protein, ur: NEGATIVE mg/dL
Specific Gravity, Urine: >1.046 — ABNORMAL HIGH (ref 1.005–1.030)
pH: 6 (ref 5.0–8.0)

## 2021-04-21 NOTE — ED Notes (Signed)
Pt given ice water to drink

## 2021-04-21 NOTE — Discharge Instructions (Signed)
Return to the emergency department if you develop high fever, worsening abdominal pain, bloody stools, or other new and concerning symptoms.

## 2021-04-21 NOTE — ED Notes (Signed)
Pt ambulated to the bathroom unassisted.  

## 2021-04-27 DIAGNOSIS — S43431A Superior glenoid labrum lesion of right shoulder, initial encounter: Secondary | ICD-10-CM | POA: Diagnosis not present

## 2021-04-27 DIAGNOSIS — M25411 Effusion, right shoulder: Secondary | ICD-10-CM | POA: Diagnosis not present

## 2021-04-27 DIAGNOSIS — M75111 Incomplete rotator cuff tear or rupture of right shoulder, not specified as traumatic: Secondary | ICD-10-CM | POA: Diagnosis not present

## 2021-04-27 DIAGNOSIS — M19011 Primary osteoarthritis, right shoulder: Secondary | ICD-10-CM | POA: Diagnosis not present

## 2021-05-02 ENCOUNTER — Encounter: Payer: Self-pay | Admitting: Orthopaedic Surgery

## 2021-05-02 ENCOUNTER — Ambulatory Visit (INDEPENDENT_AMBULATORY_CARE_PROVIDER_SITE_OTHER): Payer: BC Managed Care – PPO | Admitting: Orthopaedic Surgery

## 2021-05-02 DIAGNOSIS — M48061 Spinal stenosis, lumbar region without neurogenic claudication: Secondary | ICD-10-CM | POA: Diagnosis not present

## 2021-05-02 DIAGNOSIS — M25511 Pain in right shoulder: Secondary | ICD-10-CM | POA: Diagnosis not present

## 2021-05-02 DIAGNOSIS — G894 Chronic pain syndrome: Secondary | ICD-10-CM | POA: Diagnosis not present

## 2021-05-02 DIAGNOSIS — F1120 Opioid dependence, uncomplicated: Secondary | ICD-10-CM | POA: Diagnosis not present

## 2021-05-02 DIAGNOSIS — F17210 Nicotine dependence, cigarettes, uncomplicated: Secondary | ICD-10-CM | POA: Diagnosis not present

## 2021-05-02 DIAGNOSIS — M5416 Radiculopathy, lumbar region: Secondary | ICD-10-CM | POA: Diagnosis not present

## 2021-05-02 DIAGNOSIS — G8929 Other chronic pain: Secondary | ICD-10-CM | POA: Diagnosis not present

## 2021-05-02 NOTE — Patient Instructions (Signed)
Return to clinic to see Dr. Amedeo Kinsman

## 2021-05-02 NOTE — Progress Notes (Signed)
My shoulder is no better.  She has pain in the right shoulder that is not getting better.  She has no new trauma, no redness, no numbness.  She had MRI of the right shoulder showing: Tendinosis and fraying of the rotator cuff, 2. Small intra substance partial tear or intratendinous cyst of the infraspinatus with undersurface partial tears of the distal conjoined tendon as well as undersurface partial tear and partial delamination of the supraspinatus with small region of high-grade partial tear and probable punctate full-thickness extension of the anterior supraspinatus insertion. 3.  Small glenohumeral and moderate subacromial subdeltoid bursal effusions with proliferative tissue possible degenerative/reactive in nature and/or related to capsulitis/bursitis.4. Degenerative labral changes with type I SLAP and mild chondromalacia of the mid to posterior glenoid.  5.  Mild hypertrophy, spurring and proliferative tissue of the [facility] joint with type III acromion.  I have gone over the findings with her.  I will have Dr. Amedeo Kinsman see her.  She is agreeable.  I have independently reviewed the MRI.     She is going to see her pain management doctor today.  ROM of the right shoulder is good with pain in the extremes.  NV intact.  Encounter Diagnoses  Name Primary?   Chronic right shoulder pain Yes   Nicotine dependence, cigarettes, uncomplicated    Chronic pain syndrome    To see Dr. Amedeo Kinsman.  Call if any problem.  Precautions discussed.  Electronically Signed Sanjuana Kava, MD 1/31/202310:44 AM

## 2021-05-03 ENCOUNTER — Encounter: Payer: Self-pay | Admitting: Orthopedic Surgery

## 2021-05-03 ENCOUNTER — Other Ambulatory Visit: Payer: Self-pay

## 2021-05-03 ENCOUNTER — Ambulatory Visit (INDEPENDENT_AMBULATORY_CARE_PROVIDER_SITE_OTHER): Payer: BC Managed Care – PPO | Admitting: Orthopedic Surgery

## 2021-05-03 DIAGNOSIS — G8929 Other chronic pain: Secondary | ICD-10-CM

## 2021-05-03 DIAGNOSIS — M25511 Pain in right shoulder: Secondary | ICD-10-CM

## 2021-05-03 NOTE — Progress Notes (Addendum)
New Patient Visit  Assessment: Amber Drake is a 61 y.o. female with the following: Chronic right shoulder pain  Plan: Patient has had pain in the right shoulder for several months.  Prior shoulder injection did not provide sustained relief.  She states it only helped for a few days.  She has difficulty with overhead motion.  We reviewed the MRI in clinic today, which demonstrates delaminating rotator cuff tendons.  Overall, the tendon appears to be chronically injured.  In addition, she has a SLAP tear.    Based on the appearance on MRI, I am concerned about proceeding with surgery.  The tissue looks degenerative, and I have concerned that she may not heal a repair.  There are also delaminated components of the tendons.  At this time, I think it is best to exhaust all potential nonoperative treatment options.  She has tried medications, PT and a subacromial steroid injection.  As a result, I recommended an x-ray guided right glenohumeral injection.  We placed this order, and this will be scheduled.  Depending on the efficacy of this injection, we will see her back in clinic for ongoing follow-up.   Follow-up: Return in about 2 months (around 07/01/2021).  Subjective:  Chief Complaint  Patient presents with   consultation    RT shoulder/ DR. Keeling referred    History of Present Illness: Amber Drake is a 61 y.o. female who presents for evaluation of right shoulder pain.  She has had pain in the right shoulder for a couple of months.  No specific injury.  She has been followed by Dr. Hilda Lias.  She has difficulty with overhead motion.  She has had an injection, which did not provide much sustained relief.  She is on chronic narcotic therapy.  She has had an MRI, and presents today to review the findings.   Review of Systems: No fevers or chills No numbness or tingling No chest pain No shortness of breath No bowel or bladder dysfunction No GI distress No  headaches   Medical History:  Past Medical History:  Diagnosis Date   Anemia    Arthritis    Blood dyscrasia    sickle cell trait   Cancer (HCC)    lymphoma   Carbuncle and furuncle    Hypercholesterolemia    Hypertension    does not take meds   Papanicolaou smear of cervix with positive high risk human papilloma virus (HPV) test 08/23/2020   08/23/20    Colpo per ASCCP guidelines, immediate risk of CIN 3+ is 4.1 %   Sickle cell trait (HCC)     Past Surgical History:  Procedure Laterality Date   APPENDECTOMY     APPLICATION OF CRANIAL NAVIGATION N/A 07/28/2018   Procedure: APPLICATION OF CRANIAL NAVIGATION;  Surgeon: Donalee Citrin, MD;  Location: [facility] OR;  Service: Neurosurgery;  Laterality: N/A;   BRAIN SURGERY     CARPAL TUNNEL RELEASE  03/23/2011   Procedure: CARPAL TUNNEL RELEASE;  Surgeon: Darreld Mclean;  Location: [facility] ORS;  Service: Orthopedics;  Laterality: Left;   CARPAL TUNNEL RELEASE  05/10/2011   Procedure: CARPAL TUNNEL RELEASE;  Surgeon: Darreld Mclean, MD;  Location: [facility] ORS;  Service: Orthopedics;  Laterality: Right;   CESAREAN SECTION     x 2   COLONOSCOPY WITH PROPOFOL N/A 11/16/2020   Procedure: COLONOSCOPY WITH PROPOFOL;  Surgeon: Dolores Frame, MD;  Location: [facility] ENDO SUITE;  Service: Gastroenterology;  Laterality: N/A;  9:15   INCISION AND DRAINAGE PERIRECTAL  ABSCESS  12/21/09   LUMBAR LAMINECTOMY/DECOMPRESSION MICRODISCECTOMY Left 12/12/2020   Procedure: Laminectomy and Foraminotomy - left - L5-S1;  Surgeon: Donalee Citrin, MD;  Location: [facility] OR;  Service: Neurosurgery;  Laterality: Left;  3C   PR DURAL GRAFT REPAIR,SPINE DEFECT N/A 07/28/2018   Procedure: Stereotactic open biopsy of Right cerebellar hemisphere and dura with brainlab;  Surgeon: Donalee Citrin, MD;  Location: [facility] OR;  Service: Neurosurgery;  Laterality: N/A;  Stereotactic open biopsy of Right cerebellar hemisphere and dura with brainlab   PROCTOSCOPY  10/17/2010   Procedure: PROCTOSCOPY;  Surgeon:  Marlane Hatcher;  Location: [facility] ORS;  Service: General;  Laterality: N/A;  Rigid Proctoscopy/Possible Fistula in Ano  Procedure ended at 1003   THERAPEUTIC ABORTION     x2    Family History  Problem Relation Age of Onset   Hypertension Mother    Hyperlipidemia Mother    Hypertension Father    Lupus Father        skin   Sarcoidosis Sister    Kidney failure Daughter    Sickle cell anemia Daughter    Sickle cell trait Daughter    Sickle cell anemia Son    Hypertension Other    Anesthesia problems Neg Hx    Hypotension Neg Hx    Malignant hyperthermia Neg Hx    Pseudochol deficiency Neg Hx    Cancer - Colon Neg Hx    Social History   Tobacco Use   Smoking status: Every Day    Packs/day: 0.50    Years: 30.00    Pack years: 15.00    Types: Cigarettes   Smokeless tobacco: Never  Vaping Use   Vaping Use: Never used  Substance Use Topics   Alcohol use: No   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    Penicillins Cross Reactors Itching and Swelling    PENICILLIN > ANAPHYLAXIS   Tape Itching    Current Meds  Medication Sig   atorvastatin (LIPITOR) 20 MG tablet Take 1 tablet (20 mg total) by mouth daily.   ibuprofen (ADVIL) 800 MG tablet Take 1 tablet (800 mg total) by mouth every 8 (eight) hours as needed.   lisinopril-hydrochlorothiazide (PRINZIDE,ZESTORETIC) 20-12.5 MG tablet Take 1 tablet by mouth daily.   meclizine (ANTIVERT) 12.5 MG tablet  Take 1 tablet (12.5 mg total) by mouth 3 (three) times daily as needed for dizziness.   Multiple Vitamin (MULTIVITAMIN WITH MINERALS) TABS tablet Take 1 tablet by mouth daily.   naproxen (NAPROSYN) 500 MG tablet Take 1 tablet (500 mg total) by mouth 2 (two) times daily with a meal.   oxyCODONE (OXY IR/ROXICODONE) 5 MG immediate release tablet Take 5 mg by mouth every 6 (six) hours as needed for pain.   oxyCODONE 10 MG TABS Take 1 tablet (10 mg total) by mouth every 3 (three) hours as needed for severe pain ((score 7 to 10)).   potassium chloride SA (KLOR-CON) 20 MEQ tablet TAKE (1)  TABLET BY MOUTH TWICE DAILY.   traMADol (ULTRAM) 50 MG tablet Take 50 mg by mouth every 4 (four) hours as needed for moderate pain.    Objective: LMP 08/12/2010   Physical Exam:  General: Alert and oriented. and No acute distress. Gait: Normal gait.  Evaluation of right shoulder demonstrates no deformity.  No atrophy is appreciated.  130 degrees of forward elevation, with pain beyond this range of motion.  Abduction at her side to 90 degrees.  Internal rotation to her lower back.  Strength in the right shoulder is 4/5.  Negative belly press.  IMAGING: I personally reviewed images previously obtained in clinic  MRI of the right shoulder demonstrates high-grade partial-thickness tearing of the rotator cuff tendons.  There are some small areas of full-thickness tearing, at the footprint of the tendons.  She also has a SLAP tear.  Mild degenerative changes noted within the glenohumeral joint.   New Medications:  No orders of the defined types were placed in this encounter.     Oliver Barre, MD  05/03/2021 11:53 PM

## 2021-05-03 NOTE — Patient Instructions (Signed)
Please provide a letter for work, out of work until 05/08/21

## 2021-05-05 ENCOUNTER — Emergency Department (HOSPITAL_COMMUNITY)
Admission: EM | Admit: 2021-05-05 | Discharge: 2021-05-05 | Disposition: A | Discharge status: Home / Self Care | Payer: BC Managed Care – PPO | Attending: Emergency Medicine | Admitting: Emergency Medicine

## 2021-05-05 ENCOUNTER — Encounter (HOSPITAL_COMMUNITY): Payer: Self-pay

## 2021-05-05 ENCOUNTER — Other Ambulatory Visit: Payer: Self-pay

## 2021-05-05 ENCOUNTER — Emergency Department (HOSPITAL_COMMUNITY): Payer: BC Managed Care – PPO

## 2021-05-05 DIAGNOSIS — R791 Abnormal coagulation profile: Secondary | ICD-10-CM | POA: Insufficient documentation

## 2021-05-05 DIAGNOSIS — Z8572 Personal history of non-Hodgkin lymphomas: Secondary | ICD-10-CM | POA: Diagnosis not present

## 2021-05-05 DIAGNOSIS — R059 Cough, unspecified: Secondary | ICD-10-CM | POA: Diagnosis not present

## 2021-05-05 DIAGNOSIS — I10 Essential (primary) hypertension: Secondary | ICD-10-CM | POA: Diagnosis not present

## 2021-05-05 DIAGNOSIS — U071 COVID-19: Secondary | ICD-10-CM | POA: Diagnosis not present

## 2021-05-05 DIAGNOSIS — R509 Fever, unspecified: Secondary | ICD-10-CM | POA: Diagnosis not present

## 2021-05-05 DIAGNOSIS — R300 Dysuria: Secondary | ICD-10-CM | POA: Diagnosis not present

## 2021-05-05 DIAGNOSIS — R5383 Other fatigue: Secondary | ICD-10-CM | POA: Diagnosis not present

## 2021-05-05 DIAGNOSIS — R000 Tachycardia, unspecified: Secondary | ICD-10-CM | POA: Diagnosis not present

## 2021-05-05 LAB — CBC WITH DIFFERENTIAL/PLATELET
Abs Immature Granulocytes: 0.03 10*3/uL (ref 0.00–0.07)
Basophils Absolute: 0 10*3/uL (ref 0.0–0.1)
Basophils Relative: 0 %
Eosinophils Absolute: 0 10*3/uL (ref 0.0–0.5)
Eosinophils Relative: 0 %
HCT: 37.4 % (ref 36.0–46.0)
Hemoglobin: 12.7 g/dL (ref 12.0–15.0)
Immature Granulocytes: 0 %
Lymphocytes Relative: 8 %
Lymphs Abs: 0.7 10*3/uL (ref 0.7–4.0)
MCH: 29 pg (ref 26.0–34.0)
MCHC: 34 g/dL (ref 30.0–36.0)
MCV: 85.4 fL (ref 80.0–100.0)
Monocytes Absolute: 0.7 10*3/uL (ref 0.1–1.0)
Monocytes Relative: 9 %
Neutro Abs: 6.7 10*3/uL (ref 1.7–7.7)
Neutrophils Relative %: 83 %
Platelets: 375 10*3/uL (ref 150–400)
RBC: 4.38 MIL/uL (ref 3.87–5.11)
RDW: 13 % (ref 11.5–15.5)
WBC: 8.1 10*3/uL (ref 4.0–10.5)
nRBC: 0 % (ref 0.0–0.2)

## 2021-05-05 LAB — COMPREHENSIVE METABOLIC PANEL
ALT: 16 U/L (ref 0–44)
AST: 21 U/L (ref 15–41)
Albumin: 4.1 g/dL (ref 3.5–5.0)
Alkaline Phosphatase: 68 U/L (ref 38–126)
Anion gap: 5 (ref 5–15)
BUN: 11 mg/dL (ref 6–20)
CO2: 28 mmol/L (ref 22–32)
Calcium: 9.4 mg/dL (ref 8.9–10.3)
Chloride: 102 mmol/L (ref 98–111)
Creatinine, Ser: 0.89 mg/dL (ref 0.44–1.00)
GFR, Estimated: >60 mL/min (ref >60)
Glucose, Bld: 102 mg/dL — ABNORMAL HIGH (ref 70–99)
Potassium: 3.4 mmol/L — ABNORMAL LOW (ref 3.5–5.1)
Sodium: 135 mmol/L (ref 135–145)
Total Bilirubin: 0.2 mg/dL — ABNORMAL LOW (ref 0.3–1.2)
Total Protein: 7.4 g/dL (ref 6.5–8.1)

## 2021-05-05 LAB — RESP PANEL BY RT-PCR (FLU A&B, COVID) ARPGX2
Influenza A by PCR: NEGATIVE
Influenza B by PCR: NEGATIVE
SARS Coronavirus 2 by RT PCR: POSITIVE — AB

## 2021-05-05 LAB — PROTIME-INR
INR: 0.9 (ref 0.8–1.2)
Prothrombin Time: 12.5 seconds (ref 11.4–15.2)

## 2021-05-05 LAB — URINALYSIS, ROUTINE W REFLEX MICROSCOPIC
Bilirubin Urine: NEGATIVE
Glucose, UA: NEGATIVE mg/dL
Ketones, ur: NEGATIVE mg/dL
Nitrite: NEGATIVE
Protein, ur: NEGATIVE mg/dL
Specific Gravity, Urine: 1.01 (ref 1.005–1.030)
pH: 6 (ref 5.0–8.0)

## 2021-05-05 LAB — URINALYSIS, MICROSCOPIC (REFLEX)

## 2021-05-05 LAB — LACTIC ACID, PLASMA: Lactic Acid, Venous: 1 mmol/L (ref 0.5–1.9)

## 2021-05-05 LAB — POC URINE PREG, ED: Preg Test, Ur: NEGATIVE

## 2021-05-05 LAB — APTT: aPTT: 34 seconds (ref 24–36)

## 2021-05-05 IMAGING — DX DG CHEST 1V PORT
1 series · 1 of 1 positions shown · non-contrast
Comparison: [DATE]

CLINICAL DATA: Question sepsis. Generalized body ache and lethargy.
Cough and fever.

EXAM:
PORTABLE CHEST 1 VIEW

[chest ap]
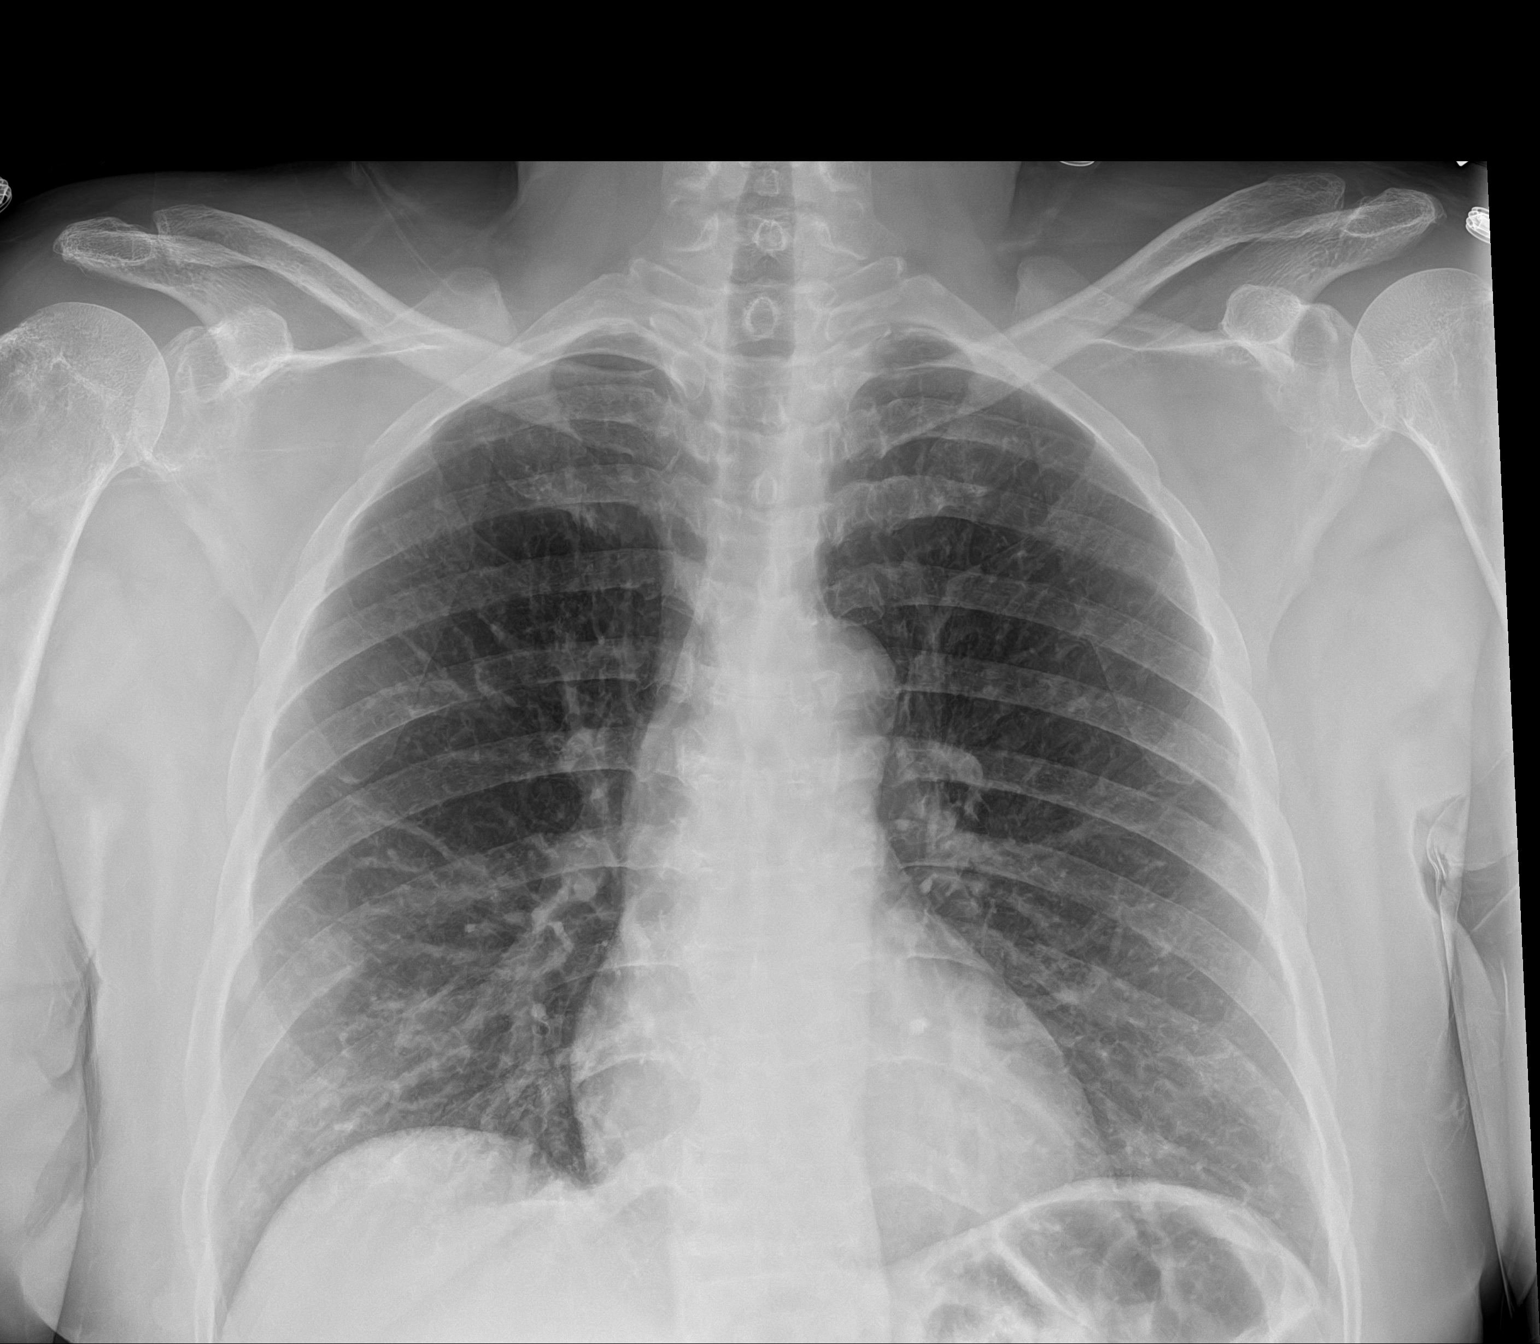

[1 of 1 positions shown; findings below may reference images not displayed]

FINDINGS: Heart size is normal. Mediastinal shadows are normal. There may be
bronchitis but there is no infiltrate, collapse or effusion. Bony
structures are unremarkable.
IMPRESSION: Possible bronchitis.  No consolidation or collapse.

## 2021-05-05 MED ORDER — LACTATED RINGERS IV SOLN: INTRAVENOUS | Status: DC

## 2021-05-05 MED ORDER — SODIUM CHLORIDE 0.9 % IV BOLUS (SEPSIS)
1000.0000 mL | Freq: Once | INTRAVENOUS | Status: AC
  Administered 2021-05-05: 1000 mL via INTRAVENOUS

## 2021-05-05 MED ORDER — NIRMATRELVIR/RITONAVIR (PAXLOVID)TABLET
3.0000 | ORAL_TABLET | Freq: Two times a day (BID) | ORAL | Status: DC
  Administered 2021-05-05: 3 via ORAL
  Filled 2021-05-05: qty 30

## 2021-05-05 MED ORDER — ACETAMINOPHEN 500 MG PO TABS
1000.0000 mg | ORAL_TABLET | Freq: Once | ORAL | Status: AC
  Administered 2021-05-05: 1000 mg via ORAL
  Filled 2021-05-05: qty 2

## 2021-05-05 NOTE — ED Provider Notes (Signed)
Emergency Department Provider Note   I have reviewed the triage vital signs and the nursing notes.   HISTORY  Chief Complaint No chief complaint on file.   HPI Amber Drake is a 61 y.o. female with past medical history reviewed below including lymphoma currently on maintenance Rituxan presents to the emergency department for evaluation of body aches and fatigue.  She is found to be febrile on arrival.  Symptoms began today.  She reports a mild cough but denies sore throat, nasal congestion, severe headache.  She is not having chest pain or abdominal discomfort.  No nausea, vomiting, diarrhea.  She reports some occasional dysuria but nothing persistent.  Denies any rash. No sick contact. Next Rituxan dose is March 7th.    Past Medical History:  Diagnosis Date   Anemia    Arthritis    Blood dyscrasia    sickle cell trait   Cancer ([place])    lymphoma   Carbuncle and furuncle    Hypercholesterolemia    Hypertension    does not take meds   Papanicolaou smear of cervix with positive high risk human papilloma virus (HPV) test 08/23/2020   08/23/20    Colpo per ASCCP guidelines, immediate risk of CIN 3+ is 4.1 %   Sickle cell trait (HCC)     Review of Systems  Constitutional: Positive fever/chills, fatigue, and body aches.  Eyes: No visual changes. ENT: No sore throat. Cardiovascular: Denies chest pain. Respiratory: Denies shortness of breath. Gastrointestinal: No abdominal pain.  No nausea, no vomiting.  No diarrhea.  No constipation. Genitourinary: Occasional dysuria. Musculoskeletal: Negative for back pain. Skin: Negative for rash. Neurological: Negative for headaches, focal weakness or numbness.   ____________________________________________   PHYSICAL EXAM:  VITAL SIGNS: ED Triage Vitals  Enc Vitals Group     BP 05/05/21 1906 129/69     Pulse Rate 05/05/21 1906 (!) 102     Resp 05/05/21 1906 18     Temp 05/05/21 1906 (!) 103.2 F (39.6 C)     Temp  Source 05/05/21 1906 Oral     SpO2 05/05/21 1906 98 %     Weight 05/05/21 1908 170 lb (77.1 kg)   Constitutional: Alert and oriented. Well appearing and in no acute distress. Eyes: Conjunctivae are normal.  Head: Atraumatic. Nose: No congestion/rhinnorhea. Mouth/Throat: Mucous membranes are moist.  Neck: No stridor.   Cardiovascular: Tachycardia. Good peripheral circulation. Grossly normal heart sounds.   Respiratory: Normal respiratory effort.  No retractions. Lungs CTAB. Gastrointestinal: Soft and nontender. No distention.  Musculoskeletal: No lower extremity tenderness nor edema. No gross deformities of extremities. Neurologic:  Normal speech and language. No gross focal neurologic deficits are appreciated.  Skin:  Skin is warm, dry and intact. No rash noted.   ____________________________________________   LABS (all labs ordered are listed, but only abnormal results are displayed)  Labs Reviewed  RESP PANEL BY RT-PCR (FLU A&B, COVID) ARPGX2 - Abnormal; Notable for the following components:      Result Value   SARS Coronavirus 2 by RT PCR POSITIVE (*)    All other components within normal limits  URINE CULTURE - Abnormal; Notable for the following components:   Culture MULTIPLE SPECIES PRESENT, SUGGEST RECOLLECTION (*)    All other components within normal limits  COMPREHENSIVE METABOLIC PANEL - Abnormal; Notable for the following components:   Potassium 3.4 (*)    Glucose, Bld 102 (*)    Total Bilirubin 0.2 (*)    All other components  within normal limits  URINALYSIS, ROUTINE W REFLEX MICROSCOPIC - Abnormal; Notable for the following components:   Hgb urine dipstick LARGE (*)    Leukocytes,Ua TRACE (*)    All other components within normal limits  URINALYSIS, MICROSCOPIC (REFLEX) - Abnormal; Notable for the following components:   Bacteria, UA FEW (*)    All other components within normal limits  CULTURE, BLOOD (ROUTINE X 2)  CULTURE, BLOOD (ROUTINE X 2)  LACTIC ACID,  PLASMA  CBC WITH DIFFERENTIAL/PLATELET  PROTIME-INR  APTT  POC URINE PREG, ED   ____________________________________________  EKG   EKG Interpretation  Date/Time:  Friday May 05 2021 20:36:53 EST Ventricular Rate:  103 PR Interval:  166 QRS Duration: 87 QT Interval:  326 QTC Calculation: 427 R Axis:   76 Text Interpretation: Sinus tachycardia Anterior infarct, old Confirmed by Nanda Quinton [phone]) on 05/05/2021 8:39:18 PM        ____________________________________________   PROCEDURES  Procedure(s) performed:   Procedures  None ________________________________________   INITIAL IMPRESSION / ASSESSMENT AND PLAN / ED COURSE  Pertinent labs & imaging results that were available during my care of the patient were reviewed by me and considered in my medical decision making (see chart for details).   This patient is Presenting for Evaluation of febrile illness, which does require a range of treatment options, and is a complaint that involves a high risk of morbidity and mortality.  The Differential Diagnoses include bacteremia, sepsis, CAP, COVID, Flu, RSV.  Critical Interventions- IVF and Tylenol along with sepsis workup.    Medications  acetaminophen (TYLENOL) tablet 1,000 mg (1,000 mg Oral Given 05/05/21 2014)  sodium chloride 0.9 % bolus 1,000 mL (0 mLs Intravenous Stopped 05/05/21 2240)    Reassessment after intervention: Fever reduced. Patient feeling well.    I did obtain Additional Historical Information from family at bedside who confirm timeline.  I decided to review pertinent External Data, and in summary patient last seen by Dr. Irene Limbo (Oncology) on  04/04/21 describing maintenance Rituxan Q8 weeks.   Clinical Laboratory Tests Ordered, included COVID positive. Sepsis labs reassuring. Normal lactate. No leukocytosis. No AKI.   Radiologic Tests Ordered, included CXR. I independently interpreted the images and agree with radiology interpretation.    Cardiac Monitor Tracing which shows NSR   Social Determinants of Health Risk patient with a smoking history.   Medical Decision Making: Summary:  Patient presents with fever and fatigue. Workup for sepsis is unremarkable. COVID positive likely explaining symptoms. No PNA. No hypoxemia or increased WOB. Plan for Paxlovid, provided at discharge. Patient to quarantine and follow up with PCP and return to the ED with any new/worsening symptoms.   Reevaluation with update and discussion with patient. She is feeling well. Discussed results and plan. In agreement with plan at discharge.   Disposition: discharge  ____________________________________________  FINAL CLINICAL IMPRESSION(S) / ED DIAGNOSES  Final diagnoses:  COVID-19     Note:  This document was prepared using Dragon voice recognition software and may include unintentional dictation errors.  Nanda Quinton, MD, [facility]    Sajan Cheatwood, [facility], MD 05/07/21 1153

## 2021-05-05 NOTE — ED Notes (Signed)
Lab notified this RN that pt refused 2nd lactic blood draw

## 2021-05-05 NOTE — Discharge Instructions (Signed)
You are seen emerged from today with fever and fatigue.  He tested positive for COVID-19.  I am starting you on antiviral medication.  Please take as directed.  If you develop worsening symptoms such as shortness of breath, pain in your chest, confusion please return to the emergency department for reevaluation.

## 2021-05-05 NOTE — ED Notes (Signed)
Pt ambulated to the bathroom unassisted.  

## 2021-05-05 NOTE — ED Notes (Signed)
ED Provider at bedside. 

## 2021-05-05 NOTE — ED Notes (Signed)
AC notified of pt's paxlovid

## 2021-05-05 NOTE — ED Triage Notes (Signed)
Pt report body aches and sleeping all day.

## 2021-05-05 NOTE — ED Notes (Signed)
X-ray at bedside

## 2021-05-07 LAB — URINE CULTURE

## 2021-05-10 LAB — CULTURE, BLOOD (ROUTINE X 2)
Culture: NO GROWTH
Special Requests: ADEQUATE

## 2021-05-15 LAB — CULTURE, BLOOD (ROUTINE X 2)
Culture: NO GROWTH
Special Requests: ADEQUATE

## 2021-05-18 ENCOUNTER — Encounter: Payer: Self-pay | Admitting: Hematology

## 2021-05-22 ENCOUNTER — Ambulatory Visit (HOSPITAL_COMMUNITY): Admission: RE | Admit: 2021-05-22 | Payer: BC Managed Care – PPO | Source: Ambulatory Visit

## 2021-05-22 ENCOUNTER — Other Ambulatory Visit: Payer: Self-pay | Admitting: Hematology

## 2021-05-22 DIAGNOSIS — C884 Extranodal marginal zone B-cell lymphoma of mucosa-associated lymphoid tissue [MALT-lymphoma]: Secondary | ICD-10-CM

## 2021-06-02 ENCOUNTER — Encounter: Payer: Self-pay | Admitting: Orthopedic Surgery

## 2021-06-02 ENCOUNTER — Ambulatory Visit (INDEPENDENT_AMBULATORY_CARE_PROVIDER_SITE_OTHER): Payer: BC Managed Care – PPO | Admitting: Orthopedic Surgery

## 2021-06-02 ENCOUNTER — Other Ambulatory Visit: Payer: Self-pay

## 2021-06-02 DIAGNOSIS — M75111 Incomplete rotator cuff tear or rupture of right shoulder, not specified as traumatic: Secondary | ICD-10-CM | POA: Diagnosis not present

## 2021-06-02 NOTE — Patient Instructions (Addendum)
Please call to schedule your appointment with [facility]:  ? ?Central Scheduling ?[phone]  ? ?Your injection appointment is Monday 06/05/21. Arrive at the Main Entrance of [facility] and they will direct you where to go. Arrive at 10:15 ?

## 2021-06-02 NOTE — Progress Notes (Signed)
Return patient Visit ? ?Assessment: ?Amber Drake is a 61 y.o. female with the following: ?Chronic right shoulder pain; partial thickness rotator cuff injury ? ?Plan: ?Continued right shoulder pain since the last visit.  Glenohumeral joint injection was not ordered at the last visit.  An order was placed today.  If she has not had this scheduled within the next 1-2 weeks, have asked her to contact the clinic again.  We will see her back in approximately 2 months for repeat evaluation.  Pending the efficacy of the injection, we may revisit the topic of surgery for her right shoulder. ? ? ?Follow-up: ?Return in about 2 months (around 08/02/2021). ? ?Subjective: ? ?Chief Complaint  ?Patient presents with  ? Shoulder Pain  ?  RT shoulder/tingle burning type pain  ? ? ?History of Present Illness: ?Amber Drake is a 61 y.o. female who presents for repeat evaluation of right shoulder pain.  She was last seen in clinic approximately 1 month ago.  At that time, we discussed a glenohumeral joint injection.  This was to be coordinated at [facility].  Unfortunately, the appropriate order was not placed.  We did not hear from the patient until just a few days ago, at which time she scheduled a follow-up evaluation.  The pain in the shoulder has continued.  She states it is getting a little bit worse. ? ? ?Review of Systems: ?No fevers or chills ?No numbness or tingling ?No chest pain ?No shortness of breath ?No bowel or bladder dysfunction ?No GI distress ?No headaches ? ? ?Objective: ?LMP 08/12/2010  ? ?Physical Exam: ? ?General: Alert and oriented. and No acute distress. ?Gait: Normal gait. ? ?Evaluation of right shoulder demonstrates no deformity.  No atrophy is appreciated.  130 degrees of forward elevation, with pain beyond this range of motion.  Abduction at her side to 90 degrees.  Internal rotation to her lower back.  Strength in the right shoulder is 4/5.  Negative belly press. ? ?IMAGING: ?I  personally reviewed images previously obtained in clinic ? ?MRI of the right shoulder demonstrates high-grade partial-thickness tearing of the rotator cuff tendons.  There are some small areas of full-thickness tearing, at the footprint of the tendons.  She also has a SLAP tear.  Mild degenerative changes noted within the glenohumeral joint. ? ? ?New Medications:  ?No orders of the defined types were placed in this encounter. ? ? ? ? ?Mordecai Rasmussen, MD ? ?06/02/2021 ?8:13 PM ? ? ?

## 2021-06-03 ENCOUNTER — Other Ambulatory Visit: Payer: BC Managed Care – PPO

## 2021-06-05 ENCOUNTER — Ambulatory Visit (HOSPITAL_COMMUNITY)
Admission: RE | Admit: 2021-06-05 | Discharge: 2021-06-05 | Disposition: A | Discharge status: Home / Self Care | Payer: BC Managed Care – PPO | Source: Ambulatory Visit | Attending: Orthopedic Surgery | Admitting: Orthopedic Surgery

## 2021-06-05 ENCOUNTER — Encounter (HOSPITAL_COMMUNITY): Payer: Self-pay

## 2021-06-05 ENCOUNTER — Other Ambulatory Visit: Payer: Self-pay

## 2021-06-05 DIAGNOSIS — M25511 Pain in right shoulder: Secondary | ICD-10-CM | POA: Insufficient documentation

## 2021-06-05 DIAGNOSIS — M75111 Incomplete rotator cuff tear or rupture of right shoulder, not specified as traumatic: Secondary | ICD-10-CM | POA: Diagnosis not present

## 2021-06-05 DIAGNOSIS — M75121 Complete rotator cuff tear or rupture of right shoulder, not specified as traumatic: Secondary | ICD-10-CM | POA: Diagnosis not present

## 2021-06-05 DIAGNOSIS — G8929 Other chronic pain: Secondary | ICD-10-CM | POA: Diagnosis not present

## 2021-06-05 IMAGING — RF DG FLUORO GUIDE NDL PLC/BX
1 series · 1 of 1 positions shown · IV contrast (omnipaque)
Comparison: none

CLINICAL DATA: Chronic RIGHT shoulder pain, known rotator cuff tear
and SLAP lesion

EXAM:
THERAPEUTIC RIGHT SHOULDER INJECTION UNDER FLUOROSCOPY
TECHNIQUE: Written informed consent obtained following explanation of the
procedure, risks, and benefits. Time-out protocol followed. An
appropriate skin entrance site was determined. The site was marked,
prepped with Betadine, draped in the usual sterile fashion, and
infiltrated locally with buffered Lidocaine. Under fluoroscopic
guidance 22 gauge spinal needle was advanced to the superomedial
margin of the humeral head. 2 mL of Omnipaque 180 opacified the
joint space. Patient was then injected with 40 mg of Depo-Medrol and
3 mL of Sensorcaine 0.5% without difficulty.
FLUOROSCOPY:
Radiation Exposure Index (as provided by the fluoroscopic device):
2.1 mGy Kerma

[Series 1: cp_standard · 0.18mm/px · 1 of 1 slices shown]
[im 1/1]
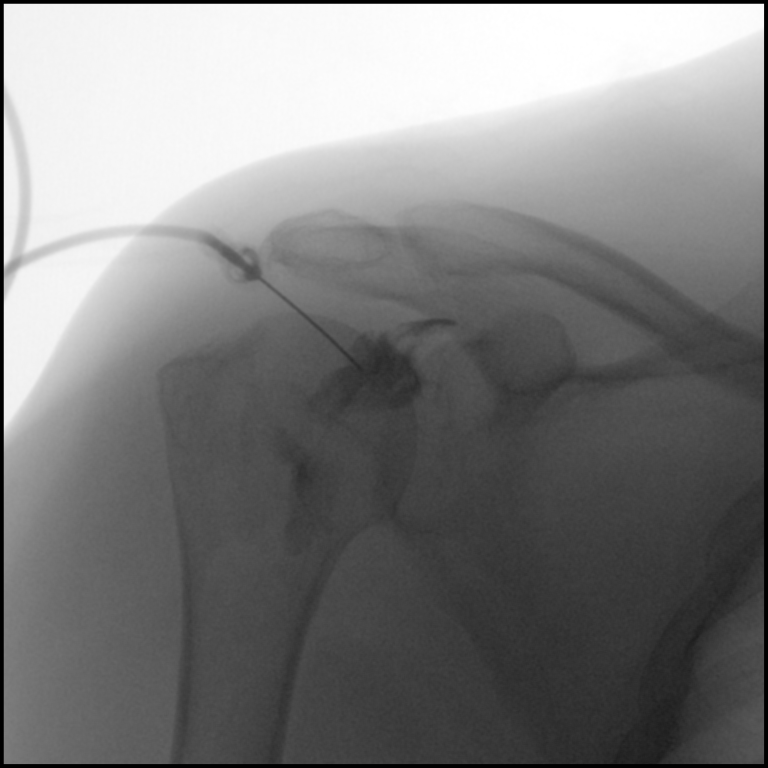

[1 of 1 positions shown; findings below may reference images not displayed]

FINDINGS: As above
IMPRESSION: Technically successful therapeutic RIGHT shoulder injection.

## 2021-06-05 MED ORDER — LIDOCAINE HCL (PF) 1 % IJ SOLN
INTRAMUSCULAR | Status: AC
  Administered 2021-06-05: 2 mL
  Filled 2021-06-05: qty 5

## 2021-06-05 MED ORDER — METHYLPREDNISOLONE ACETATE 40 MG/ML IJ SUSP
INTRAMUSCULAR | Status: AC
  Administered 2021-06-05: 40 mg
  Filled 2021-06-05: qty 1

## 2021-06-05 MED ORDER — SODIUM CHLORIDE (PF) 0.9 % IJ SOLN
INTRAMUSCULAR | Status: AC
  Filled 2021-06-05: qty 10

## 2021-06-05 MED ORDER — IOHEXOL 180 MG/ML  SOLN
20.0000 mL | Freq: Once | INTRAMUSCULAR | Status: AC | PRN
  Administered 2021-06-05: 3 mL via INTRA_ARTICULAR

## 2021-06-05 MED ORDER — BUPIVACAINE HCL (PF) 0.5 % IJ SOLN
INTRAMUSCULAR | Status: AC
  Administered 2021-06-05: 3 mL
  Filled 2021-06-05: qty 30

## 2021-06-05 MED ORDER — POVIDONE-IODINE 10 % EX SOLN
CUTANEOUS | Status: AC
  Administered 2021-06-05: 1
  Filled 2021-06-05: qty 14.8

## 2021-06-05 MED FILL — Dexamethasone Sodium Phosphate Inj 100 MG/10ML: INTRAMUSCULAR | Qty: 1 | Status: AC

## 2021-06-05 NOTE — Procedures (Signed)
Preprocedure Dx: Chronic right shoulder pain ?Postprocedure Dx: Chronic right shoulder pain ?Procedure  Fluoroscopically guided right joint injection  ?Radiologist:  Thornton Papas ?Anesthesia:  2 ml of 1% lidocaine ?Injectate:  '40mg'$  depomedrol, 3 ml of sensorcaine 0.5% ?Fluoro time:  0 minutes 24 seconds ?EBL:   none ?Complications: none  ?

## 2021-06-06 ENCOUNTER — Inpatient Hospital Stay: Payer: BC Managed Care – PPO | Attending: Hematology | Admitting: Hematology

## 2021-06-06 ENCOUNTER — Inpatient Hospital Stay: Payer: BC Managed Care – PPO

## 2021-06-06 VITALS — BP 122/66 | HR 84 | Temp 98.2°F | Resp 17

## 2021-06-06 VITALS — BP 133/68 | HR 82 | Temp 97.7°F | Resp 20 | Wt 166.0 lb

## 2021-06-06 DIAGNOSIS — Z5112 Encounter for antineoplastic immunotherapy: Secondary | ICD-10-CM | POA: Diagnosis not present

## 2021-06-06 DIAGNOSIS — C884 Extranodal marginal zone b-cell lymphoma of mucosa-associated lymphoid tissue (malt-lymphoma) not having achieved remission: Secondary | ICD-10-CM

## 2021-06-06 DIAGNOSIS — Z7189 Other specified counseling: Secondary | ICD-10-CM

## 2021-06-06 LAB — CMP (CANCER CENTER ONLY)
ALT: 42 U/L (ref 0–44)
AST: 26 U/L (ref 15–41)
Albumin: 4.3 g/dL (ref 3.5–5.0)
Alkaline Phosphatase: 86 U/L (ref 38–126)
Anion gap: 7 (ref 5–15)
BUN: 17 mg/dL (ref 6–20)
CO2: 26 mmol/L (ref 22–32)
Calcium: 10.1 mg/dL (ref 8.9–10.3)
Chloride: 104 mmol/L (ref 98–111)
Creatinine: 0.74 mg/dL (ref 0.44–1.00)
GFR, Estimated: >60 mL/min (ref >60)
Glucose, Bld: 129 mg/dL — ABNORMAL HIGH (ref 70–99)
Potassium: 3.6 mmol/L (ref 3.5–5.1)
Sodium: 137 mmol/L (ref 135–145)
Total Bilirubin: 0.2 mg/dL — ABNORMAL LOW (ref 0.3–1.2)
Total Protein: 7.1 g/dL (ref 6.5–8.1)

## 2021-06-06 LAB — CBC WITH DIFFERENTIAL (CANCER CENTER ONLY)
Abs Immature Granulocytes: 0.04 10*3/uL (ref 0.00–0.07)
Basophils Absolute: 0 10*3/uL (ref 0.0–0.1)
Basophils Relative: 0 %
Eosinophils Absolute: 0 10*3/uL (ref 0.0–0.5)
Eosinophils Relative: 0 %
HCT: 34.1 % — ABNORMAL LOW (ref 36.0–46.0)
Hemoglobin: 11.5 g/dL — ABNORMAL LOW (ref 12.0–15.0)
Immature Granulocytes: 0 %
Lymphocytes Relative: 13 %
Lymphs Abs: 1.5 10*3/uL (ref 0.7–4.0)
MCH: 28.3 pg (ref 26.0–34.0)
MCHC: 33.7 g/dL (ref 30.0–36.0)
MCV: 84 fL (ref 80.0–100.0)
Monocytes Absolute: 0.7 10*3/uL (ref 0.1–1.0)
Monocytes Relative: 6 %
Neutro Abs: 9.3 10*3/uL — ABNORMAL HIGH (ref 1.7–7.7)
Neutrophils Relative %: 81 %
Platelet Count: 380 10*3/uL (ref 150–400)
RBC: 4.06 MIL/uL (ref 3.87–5.11)
RDW: 12.9 % (ref 11.5–15.5)
WBC Count: 11.6 10*3/uL — ABNORMAL HIGH (ref 4.0–10.5)
nRBC: 0 % (ref 0.0–0.2)

## 2021-06-06 LAB — LACTATE DEHYDROGENASE: LDH: 151 U/L (ref 98–192)

## 2021-06-06 MED ORDER — FAMOTIDINE IN NACL 20-0.9 MG/50ML-% IV SOLN
20.0000 mg | Freq: Once | INTRAVENOUS | Status: AC
  Administered 2021-06-06: 20 mg via INTRAVENOUS
  Filled 2021-06-06: qty 50

## 2021-06-06 MED ORDER — SODIUM CHLORIDE 0.9 % IV SOLN
375.0000 mg/m2 | Freq: Once | INTRAVENOUS | Status: AC
  Administered 2021-06-06: 700 mg via INTRAVENOUS
  Filled 2021-06-06: qty 20

## 2021-06-06 MED ORDER — SODIUM CHLORIDE 0.9 % IV SOLN: Freq: Once | INTRAVENOUS | Status: AC

## 2021-06-06 MED ORDER — ACETAMINOPHEN 325 MG PO TABS
650.0000 mg | ORAL_TABLET | Freq: Once | ORAL | Status: AC
  Administered 2021-06-06: 650 mg via ORAL
  Filled 2021-06-06: qty 2

## 2021-06-06 MED ORDER — SODIUM CHLORIDE 0.9 % IV SOLN
10.0000 mg | Freq: Once | INTRAVENOUS | Status: AC
  Administered 2021-06-06: 10 mg via INTRAVENOUS
  Filled 2021-06-06: qty 10

## 2021-06-06 MED ORDER — DIPHENHYDRAMINE HCL 25 MG PO CAPS
50.0000 mg | ORAL_CAPSULE | Freq: Once | ORAL | Status: AC
  Administered 2021-06-06: 50 mg via ORAL
  Filled 2021-06-06: qty 2

## 2021-06-06 NOTE — Progress Notes (Unsigned)
Covid 1 month ago -- [employer]

## 2021-06-06 NOTE — Patient Instructions (Signed)
[facility]  Discharge Instructions: ?Thank you for choosing [facility] to provide your oncology and hematology care.  ? ?If you have a lab appointment with the [facility], please go directly to the [facility] and check in at the registration area. ?  ?Wear comfortable clothing and clothing appropriate for easy access to any Portacath or PICC line.  ? ?We strive to give you quality time with your provider. You may need to reschedule your appointment if you arrive late (15 or more minutes).  Arriving late affects you and other patients whose appointments are after yours.  Also, if you miss three or more appointments without notifying the office, you may be dismissed from the clinic at the provider?s discretion.    ?  ?For prescription refill requests, have your pharmacy contact our office and allow 72 hours for refills to be completed.   ? ?Today you received the following chemotherapy and/or immunotherapy agent: Rituzan    ?  ?To help prevent nausea and vomiting after your treatment, we encourage you to take your nausea medication as directed. ? ?BELOW ARE SYMPTOMS THAT SHOULD BE REPORTED IMMEDIATELY: ?*FEVER GREATER THAN 100.4 F (38 ?C) OR HIGHER ?*CHILLS OR SWEATING ?*NAUSEA AND VOMITING THAT IS NOT CONTROLLED WITH YOUR NAUSEA MEDICATION ?*UNUSUAL SHORTNESS OF BREATH ?*UNUSUAL BRUISING OR BLEEDING ?*URINARY PROBLEMS (pain or burning when urinating, or frequent urination) ?*BOWEL PROBLEMS (unusual diarrhea, constipation, pain near the anus) ?TENDERNESS IN MOUTH AND THROAT WITH OR WITHOUT PRESENCE OF ULCERS (sore throat, sores in mouth, or a toothache) ?UNUSUAL RASH, SWELLING OR PAIN  ?UNUSUAL VAGINAL DISCHARGE OR ITCHING  ? ?Items with * indicate a potential emergency and should be followed up as soon as possible or go to the Emergency Department if any problems should occur. ? ?Please show the CHEMOTHERAPY ALERT CARD or IMMUNOTHERAPY ALERT CARD at check-in to the  Emergency Department and triage nurse. ? ?Should you have questions after your visit or need to cancel or reschedule your appointment, please contact [facility]  Dept: [phone]  and follow the prompts.  Office hours are 8:00 a.m. to 4:30 p.m. Monday - Friday. Please note that voicemails left after 4:00 p.m. may not be returned until the following business day.  We are closed weekends and major holidays. You have access to a nurse at all times for urgent questions. Please call the main number to the clinic Dept: [phone] and follow the prompts. ? ? ?For any non-urgent questions, you may also contact your provider using MyChart. We now offer e-Visits for anyone 52 and older to request care online for non-urgent symptoms. For details visit [email or web address]. ?  ?Also download the MyChart app! Go to the app store, search "MyChart", open the app, select [facility], and log in with your MyChart username and password. ? ?Due to Covid, a mask is required upon entering the hospital/clinic. If you do not have a mask, one will be given to you upon arrival. For doctor visits, patients may have 1 support person aged 71 or older with them. For treatment visits, patients cannot have anyone with them due to current Covid guidelines and our immunocompromised population.  ? ?

## 2021-06-07 ENCOUNTER — Telehealth: Payer: Self-pay | Admitting: Hematology

## 2021-06-07 NOTE — Telephone Encounter (Signed)
Scheduled follow-up appointment per 3/7 los. Patient is aware. ?

## 2021-06-13 ENCOUNTER — Encounter: Payer: Self-pay | Admitting: Hematology

## 2021-06-19 ENCOUNTER — Emergency Department (HOSPITAL_COMMUNITY)
Admission: EM | Admit: 2021-06-19 | Discharge: 2021-06-19 | Disposition: A | Discharge status: Home / Self Care | Payer: BC Managed Care – PPO | Attending: Emergency Medicine | Admitting: Emergency Medicine

## 2021-06-19 ENCOUNTER — Other Ambulatory Visit: Payer: Self-pay

## 2021-06-19 ENCOUNTER — Encounter (HOSPITAL_COMMUNITY): Payer: Self-pay

## 2021-06-19 DIAGNOSIS — Z8572 Personal history of non-Hodgkin lymphomas: Secondary | ICD-10-CM | POA: Diagnosis not present

## 2021-06-19 DIAGNOSIS — M545 Low back pain, unspecified: Secondary | ICD-10-CM | POA: Insufficient documentation

## 2021-06-19 DIAGNOSIS — M25551 Pain in right hip: Secondary | ICD-10-CM | POA: Diagnosis not present

## 2021-06-19 DIAGNOSIS — M791 Myalgia, unspecified site: Secondary | ICD-10-CM | POA: Diagnosis not present

## 2021-06-19 DIAGNOSIS — M7918 Myalgia, other site: Secondary | ICD-10-CM | POA: Insufficient documentation

## 2021-06-19 DIAGNOSIS — R000 Tachycardia, unspecified: Secondary | ICD-10-CM | POA: Diagnosis not present

## 2021-06-19 DIAGNOSIS — M25552 Pain in left hip: Secondary | ICD-10-CM | POA: Insufficient documentation

## 2021-06-19 DIAGNOSIS — M79661 Pain in right lower leg: Secondary | ICD-10-CM | POA: Insufficient documentation

## 2021-06-19 DIAGNOSIS — U071 COVID-19: Secondary | ICD-10-CM | POA: Diagnosis not present

## 2021-06-19 DIAGNOSIS — M79662 Pain in left lower leg: Secondary | ICD-10-CM | POA: Diagnosis not present

## 2021-06-19 LAB — CBC WITH DIFFERENTIAL/PLATELET
Abs Immature Granulocytes: 0.02 10*3/uL (ref 0.00–0.07)
Basophils Absolute: 0 10*3/uL (ref 0.0–0.1)
Basophils Relative: 0 %
Eosinophils Absolute: 0.1 10*3/uL (ref 0.0–0.5)
Eosinophils Relative: 2 %
HCT: 34.8 % — ABNORMAL LOW (ref 36.0–46.0)
Hemoglobin: 11.5 g/dL — ABNORMAL LOW (ref 12.0–15.0)
Immature Granulocytes: 0 %
Lymphocytes Relative: 25 %
Lymphs Abs: 1.9 10*3/uL (ref 0.7–4.0)
MCH: 28.9 pg (ref 26.0–34.0)
MCHC: 33 g/dL (ref 30.0–36.0)
MCV: 87.4 fL (ref 80.0–100.0)
Monocytes Absolute: 0.6 10*3/uL (ref 0.1–1.0)
Monocytes Relative: 8 %
Neutro Abs: 4.9 10*3/uL (ref 1.7–7.7)
Neutrophils Relative %: 65 %
Platelets: 379 10*3/uL (ref 150–400)
RBC: 3.98 MIL/uL (ref 3.87–5.11)
RDW: 13.3 % (ref 11.5–15.5)
WBC: 7.5 10*3/uL (ref 4.0–10.5)
nRBC: 0 % (ref 0.0–0.2)

## 2021-06-19 LAB — COMPREHENSIVE METABOLIC PANEL
ALT: 44 U/L (ref 0–44)
AST: 30 U/L (ref 15–41)
Albumin: 3.7 g/dL (ref 3.5–5.0)
Alkaline Phosphatase: 87 U/L (ref 38–126)
Anion gap: 9 (ref 5–15)
BUN: 14 mg/dL (ref 6–20)
CO2: 24 mmol/L (ref 22–32)
Calcium: 9.2 mg/dL (ref 8.9–10.3)
Chloride: 105 mmol/L (ref 98–111)
Creatinine, Ser: 0.79 mg/dL (ref 0.44–1.00)
GFR, Estimated: >60 mL/min (ref >60)
Glucose, Bld: 152 mg/dL — ABNORMAL HIGH (ref 70–99)
Potassium: 3.3 mmol/L — ABNORMAL LOW (ref 3.5–5.1)
Sodium: 138 mmol/L (ref 135–145)
Total Bilirubin: 0.4 mg/dL (ref 0.3–1.2)
Total Protein: 6.7 g/dL (ref 6.5–8.1)

## 2021-06-19 MED ORDER — IBUPROFEN 800 MG PO TABS: 800.0000 mg | ORAL_TABLET | Freq: Three times a day (TID) | ORAL | 0 refills | Status: DC | PRN

## 2021-06-19 MED ORDER — POTASSIUM CHLORIDE CRYS ER 20 MEQ PO TBCR
20.0000 meq | EXTENDED_RELEASE_TABLET | Freq: Once | ORAL | Status: AC
  Administered 2021-06-19: 20 meq via ORAL
  Filled 2021-06-19: qty 1

## 2021-06-19 NOTE — ED Provider Notes (Signed)
?Amber Drake ?Provider Note ? ? ?CSN: [id number] ?Arrival date & time: 06/19/21  1856 ? ?  ? ?History ? ?Chief Complaint  ?Patient presents with  ? Hip Pain  ? ? ?Amber Drake is a 61 y.o. female. ? ?Patient complains of aching in her hips her legs and thighs.  Patient reports she has a history of lymphoma.  Patient is concerned that her body aches could be secondary to lymphoma she is also concerned that she could have rheumatoid arthritis because she has a brother who has rheumatoid arthritis.  Patient reports she stands on hard floors at work for multiple hours straight.  Patient thinks that the standing has caused her to have the current aching her in her body.  Patient is scheduled to have an MRI on 322 for evaluation by her oncologist. ? ?The history is provided by the patient. No language interpreter was used.  ?Muscle Pain ?This is a new problem. The current episode started more than 2 days ago. The problem occurs constantly. The problem has been gradually worsening. Pertinent negatives include no headaches and no shortness of breath. She has tried nothing for the symptoms.  ? ?  ? ?Home Medications ?Prior to Admission medications   ?Medication Sig Start Date End Date Taking? Authorizing Provider  ?ibuprofen (ADVIL) 800 MG tablet Take 1 tablet (800 mg total) by mouth every 8 (eight) hours as needed. 06/19/21  Yes Caryl Ada K, PA-C  ?atorvastatin (LIPITOR) 20 MG tablet Take 1 tablet (20 mg total) by mouth daily. 03/14/15   Soyla Dryer, PA-C  ?lisinopril-hydrochlorothiazide (PRINZIDE,ZESTORETIC) 20-12.5 MG tablet Take 1 tablet by mouth daily.    [provider]  ?meclizine (ANTIVERT) 12.5 MG tablet Take 1 tablet (12.5 mg total) by mouth 3 (three) times daily as needed for dizziness. 09/25/20   Davonna Belling, MD  ?Multiple Vitamin (MULTIVITAMIN WITH MINERALS) TABS tablet Take 1 tablet by mouth daily.    [provider]  ?naproxen (NAPROSYN) 500 MG tablet Take  1 tablet (500 mg total) by mouth 2 (two) times daily with a meal. 03/21/21   Sanjuana Kava, MD  ?oxyCODONE (OXY IR/ROXICODONE) 5 MG immediate release tablet Take 5 mg by mouth every 6 (six) hours as needed for pain. 11/17/20   [provider]  ?oxyCODONE 10 MG TABS Take 1 tablet (10 mg total) by mouth every 3 (three) hours as needed for severe pain ((score 7 to 10)). 12/12/20   Kary Kos, MD  ?potassium chloride SA (KLOR-CON) 20 MEQ tablet TAKE (1) TABLET BY MOUTH TWICE DAILY. 01/31/21   Brunetta Genera, MD  ?   ? ?Allergies    ?Penicillins, Penicillins cross reactors, and Tape   ? ?Review of Systems   ?Review of Systems  ?Constitutional:  Negative for chills.  ?HENT:  Negative for ear pain.   ?Respiratory:  Negative for shortness of breath.   ?Gastrointestinal:  Negative for vomiting.  ?Genitourinary:  Negative for dysuria and hematuria.  ?Musculoskeletal:  Positive for arthralgias and back pain.  ?Skin:  Negative for color change and rash.  ?Neurological:  Negative for headaches.  ?All other systems reviewed and are negative. ? ?Physical Exam ?Updated Vital Signs ?BP 119/67 (BP Location: Left Arm)   Pulse 76   Temp 98.9 ?F (37.2 ?C) (Oral)   Resp 14   Ht '5\' 4"'$  (1.626 m)   Wt 74.8 kg   LMP 08/12/2010   SpO2 99%   BMI 28.32 kg/m?  ?Physical Exam ?Vitals and  nursing note reviewed.  ?Constitutional:   ?   Appearance: She is well-developed.  ?HENT:  ?   Head: Normocephalic.  ?   Right Ear: Tympanic membrane normal.  ?   Nose: Nose normal.  ?   Mouth/Throat:  ?   Mouth: Mucous membranes are moist.  ?Eyes:  ?   Pupils: Pupils are equal, round, and reactive to light.  ?Cardiovascular:  ?   Rate and Rhythm: Normal rate.  ?Pulmonary:  ?   Effort: Pulmonary effort is normal.  ?Abdominal:  ?   General: There is no distension.  ?Musculoskeletal:     ?   General: Normal range of motion.  ?   Cervical back: Normal range of motion.  ?Neurological:  ?   Mental Status: She is alert and oriented to person,  place, and time.  ? ? ?ED Results / Procedures / Treatments   ?Labs ?(all labs ordered are listed, but only abnormal results are displayed) ?Labs Reviewed  ?CBC WITH DIFFERENTIAL/PLATELET - Abnormal; Notable for the following components:  ?    Result Value  ? Hemoglobin 11.5 (*)   ? HCT 34.8 (*)   ? All other components within normal limits  ?COMPREHENSIVE METABOLIC PANEL - Abnormal; Notable for the following components:  ? Potassium 3.3 (*)   ? Glucose, Bld 152 (*)   ? All other components within normal limits  ? ? ?EKG ?None ? ?Radiology ?No results found. ? ?Procedures ?Procedures  ? ? ?Medications Ordered in ED ?Medications  ?potassium chloride SA (KLOR-CON M) CR tablet 20 mEq (20 mEq Oral Given 06/19/21 2149)  ? ? ?ED Course/ Medical Decision Making/ A&P ?  ?                        ?Medical Decision Making ?Patient complains of soreness in her lower legs up to her hips and some soreness in her low back ? ?Problems Addressed: ?Myalgia, multiple sites: acute illness or injury ? ?Amount and/or Complexity of Data Reviewed ?External Data Reviewed: notes. ?   Details: Notes from Dr. Irene Limbo oncology reviewed patient is scheduled for an MRI on 322 ?Labs: ordered. Decision-making details documented in ED Course. ?   Details: Labs ordered reviewed and interpreted complete blood count shows a hemoglobin of 11.5 and is otherwise normal chemistry shows a potassium of 3.3 patient states she did not take her potassium at home today ? ?Risk ?Prescription drug management. ?Risk Details: Has normal vital signs she is very stable appearing.  Patient counseled on laboratory evaluations I have advised her to follow-up with Dr. Legrand Rams for recheck and for her concerns about rheumatoid arthritis.  Patient is given a prescription for ibuprofen 800 mg for myalgias she is advised to take her home potassium supplement.  Patient is given a note for work for the next 2 days.  She is to keep appointment for follow-up MRI which has already been  scheduled by Dr. Irene Limbo ? ? ? ? ? ? ? ? ? ? ?Final Clinical Impression(s) / ED Diagnoses ?Final diagnoses:  ?Myalgia, multiple sites  ? ? ?Rx / DC Orders ?ED Discharge Orders   ? ?      Ordered  ?  ibuprofen (ADVIL) 800 MG tablet  Every 8 hours PRN       ? 06/19/21 2139  ? ?  ?  ? ?  ? ?An After Visit Summary was printed and given to the patient.  ?  ?Fransico Meadow,  PA-C ?06/19/21 2234 ? ?  ?Isla Pence, MD ?06/20/21 1522 ? ?

## 2021-06-19 NOTE — Discharge Instructions (Addendum)
Keep appointment for [facility] as scheduled  ?

## 2021-06-19 NOTE — ED Triage Notes (Signed)
Ambulatory to ED with c/o bilateral hip pain since Friday. 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  ?

## 2021-06-21 ENCOUNTER — Ambulatory Visit
Admission: RE | Admit: 2021-06-21 | Discharge: 2021-06-21 | Disposition: A | Discharge status: Home / Self Care | Payer: BC Managed Care – PPO | Source: Ambulatory Visit | Attending: Hematology | Admitting: Hematology

## 2021-06-21 DIAGNOSIS — D496 Neoplasm of unspecified behavior of brain: Secondary | ICD-10-CM | POA: Diagnosis not present

## 2021-06-21 DIAGNOSIS — G9389 Other specified disorders of brain: Secondary | ICD-10-CM | POA: Diagnosis not present

## 2021-06-21 DIAGNOSIS — Z8572 Personal history of non-Hodgkin lymphomas: Secondary | ICD-10-CM | POA: Diagnosis not present

## 2021-06-21 DIAGNOSIS — Q046 Congenital cerebral cysts: Secondary | ICD-10-CM | POA: Diagnosis not present

## 2021-06-21 DIAGNOSIS — C884 Extranodal marginal zone B-cell lymphoma of mucosa-associated lymphoid tissue [MALT-lymphoma]: Secondary | ICD-10-CM

## 2021-06-21 IMAGING — MR MR HEAD WO/W CM
13 series · 48 of 48 positions shown · IV contrast (15cc multihance)
Comparison: MRI brain [DATE]

CLINICAL DATA: Follow-up treatment response. History of meningeal
extranodal marginal zone lymphoma

EXAM:
MRI HEAD WITHOUT AND WITH CONTRAST
TECHNIQUE: Multiplanar, multiecho pulse sequences of the brain and surrounding
structures were obtained without and with intravenous contrast.
CONTRAST:  15mL MULTIHANCE GADOBENATE DIMEGLUMINE 529 MG/ML IV SOLN

[Series 2: T1 · sagittal · 5.0mm · 0.45mm/px · 1 of 23 slices shown]
[im 1/23]
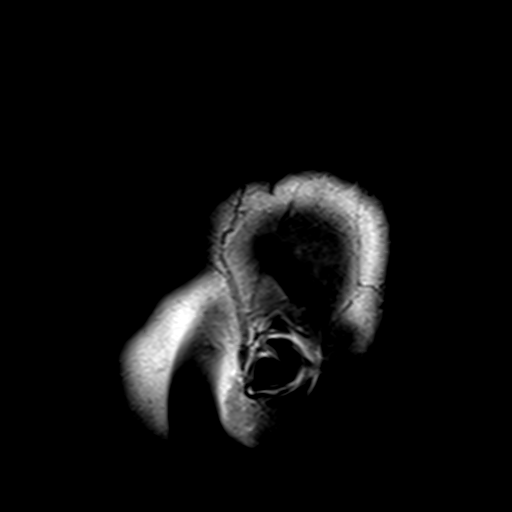

[Series 3: DWI · axial · 3.0mm · 1.80mm/px · z∈[-71,+75]mm · 7 of 100 slices shown (1 of 4)]
[im 1/100]
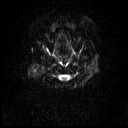
[im 17/100]
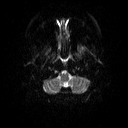
[im 34/100]
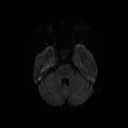
[im 50/100]
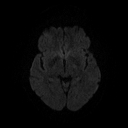
[im 67/100]
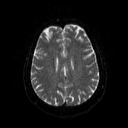
[im 83/100]
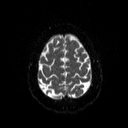
[im 100/100]
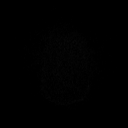

[Series 4: DWI · axial · 3.0mm · 1.80mm/px · z∈[-71,+75]mm · 3 of 49 slices shown (2 of 4)]
[im 1/49]
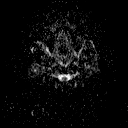
[im 25/49]
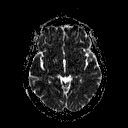
[im 49/49]
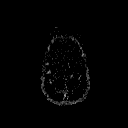

[Series 5: DWI · coronal · 5.0mm · 1.80mm/px · 5 of 69 slices shown (3 of 4)]
[im 1/69]
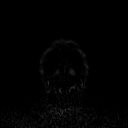
[im 18/69]
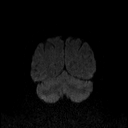
[im 35/69]
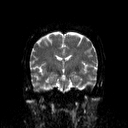
[im 52/69]
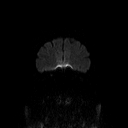
[im 69/69]
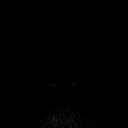

[Series 6: DWI · coronal · 5.0mm · 1.80mm/px · 2 of 36 slices shown (4 of 4)]
[im 1/36]
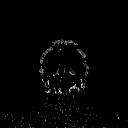
[im 36/36]
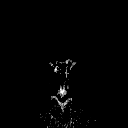

[Series 7: T2 · axial · 5.0mm · 0.72mm/px · z∈[-75,+79]mm · 2 of 24 slices shown (1 of 2)]
[im 1/24]
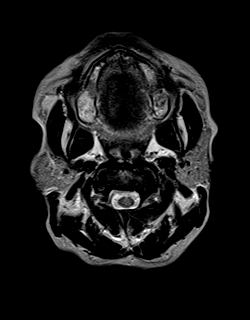
[im 24/24]
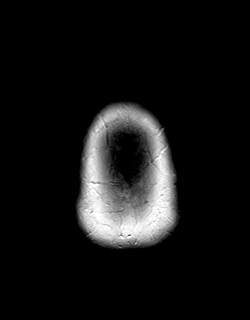

[Series 8: FLAIR · axial · 3.0mm · 0.45mm/px · z∈[-65,+69]mm · 2 of 30 slices shown]
[im 1/30]
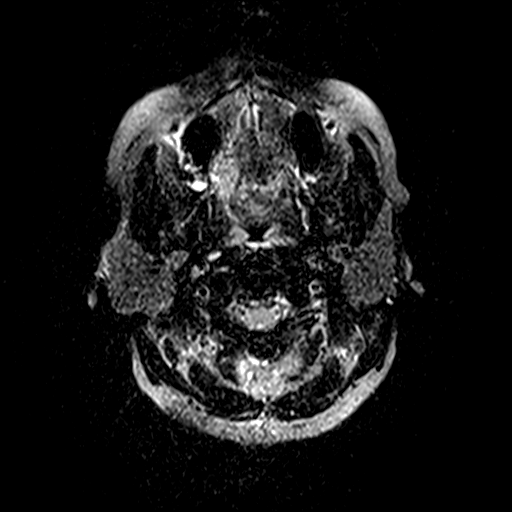
[im 30/30]
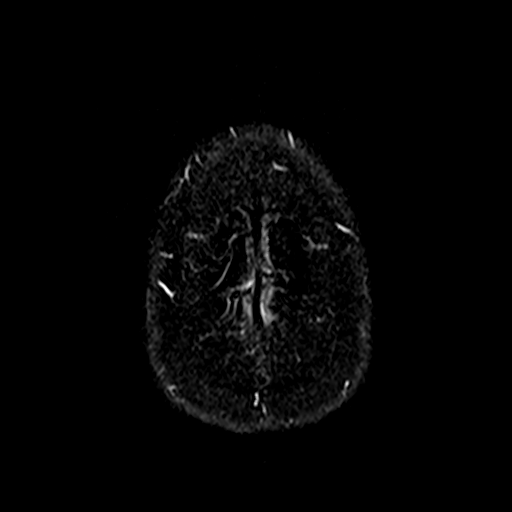

[Series 10: swi_images · axial · 4.0mm · 0.90mm/px · z∈[-67,+72]mm · 2 of 36 slices shown]
[im 1/36]
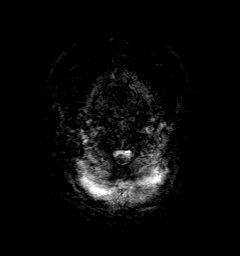
[im 36/36]
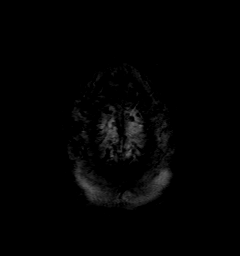

[Series 11: t1_mpr_tra · axial · 1.0mm · 0.75mm/px · z∈[-66,+76]mm · 9 of 144 slices shown]
[im 1/144]
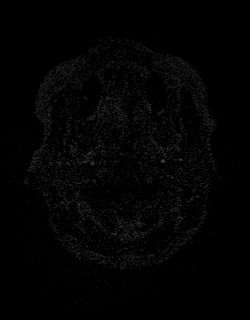
[im 18/144]
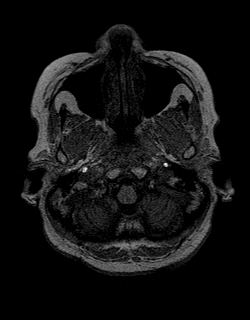
[im 36/144]
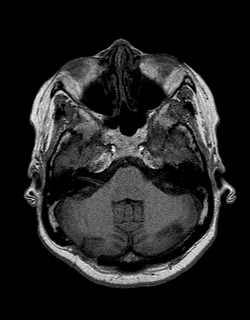
[im 54/144]
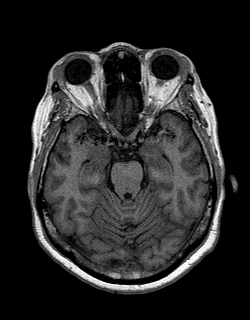
[im 72/144]
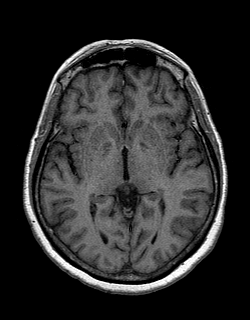
[im 90/144]
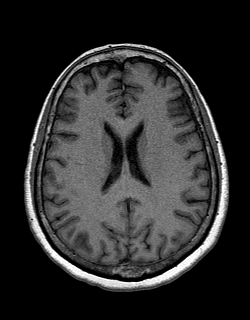
[im 108/144]
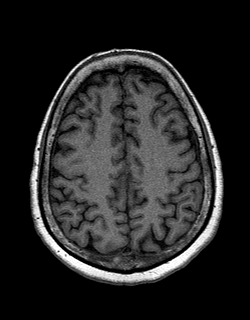
[im 126/144]
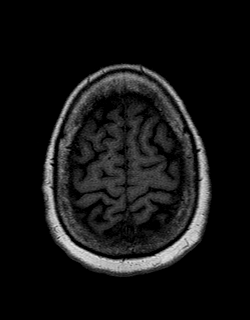
[im 144/144]
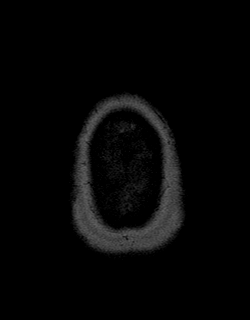

[Series 12: T2 · coronal · 5.0mm · 0.45mm/px · 2 of 25 slices shown (2 of 2)]
[im 1/25]
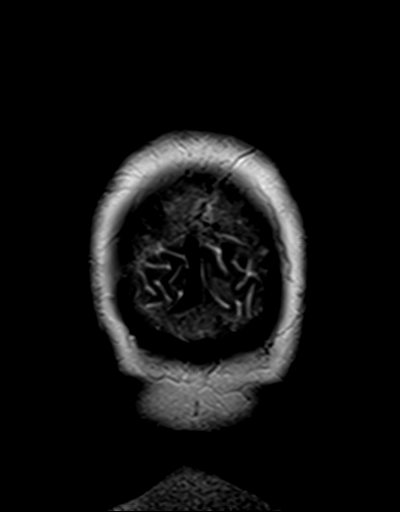
[im 25/25]
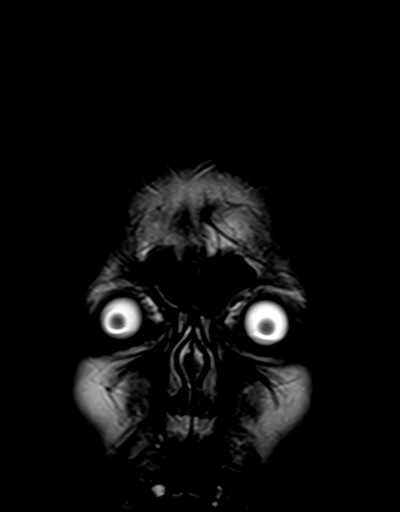

[Series 13: t1_mpr_tra post · axial · 1.0mm · 0.75mm/px · z∈[-66,+76]mm · 9 of 144 slices shown]
[im 1/144]
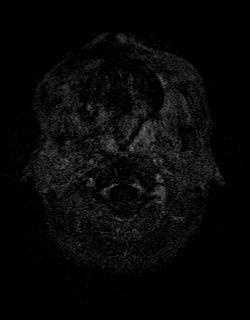
[im 18/144]
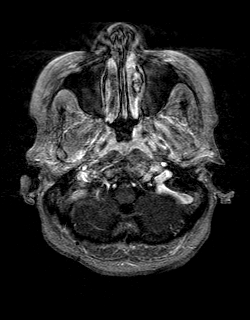
[im 36/144]
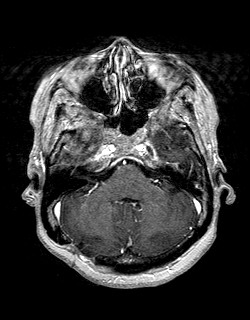
[im 54/144]
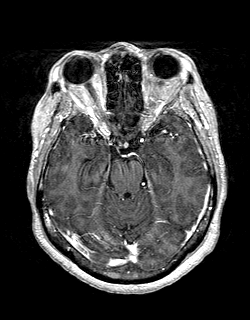
[im 72/144]
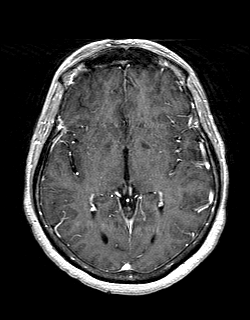
[im 90/144]
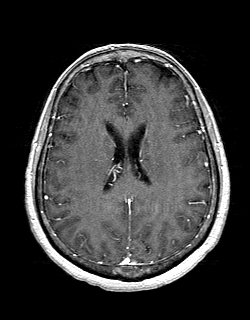
[im 108/144]
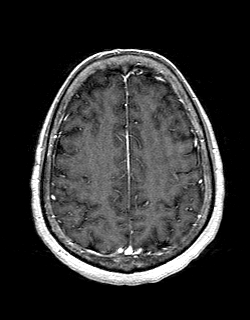
[im 126/144]
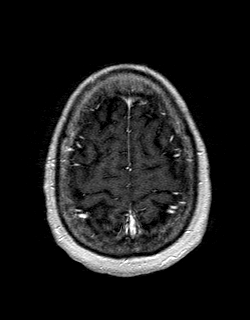
[im 144/144]
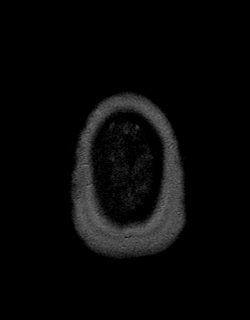

[Series 14: post cor · coronal · 5.0mm · 0.45mm/px · 2 of 25 slices shown]
[im 1/25]
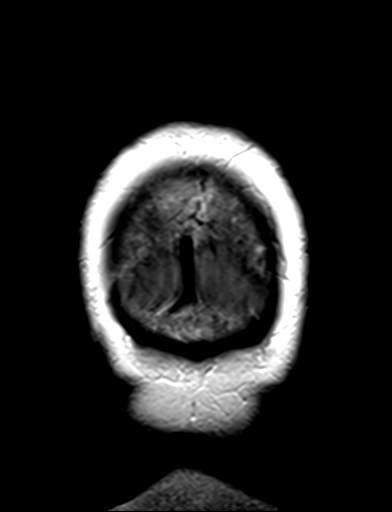
[im 25/25]
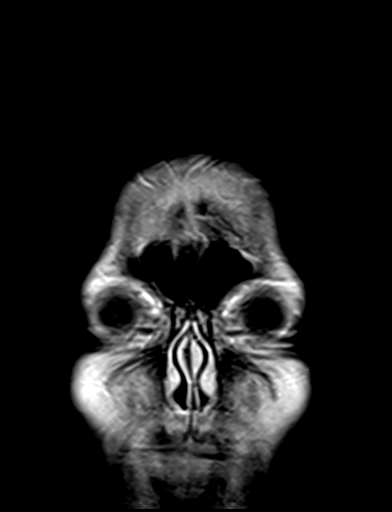

[Series 15: T1 post-contrast · sagittal · 5.0mm · 0.45mm/px · 2 of 23 slices shown]
[im 1/23]
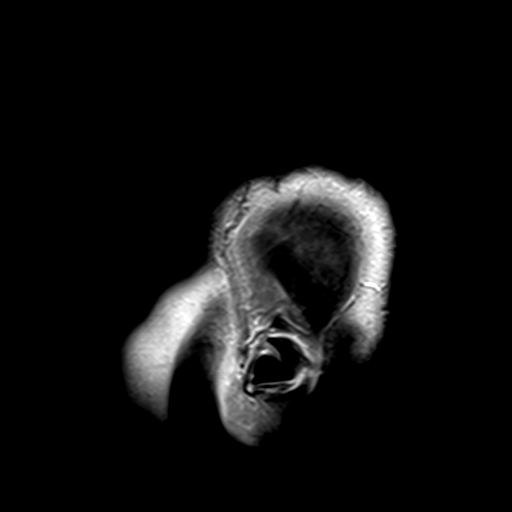
[im 23/23]
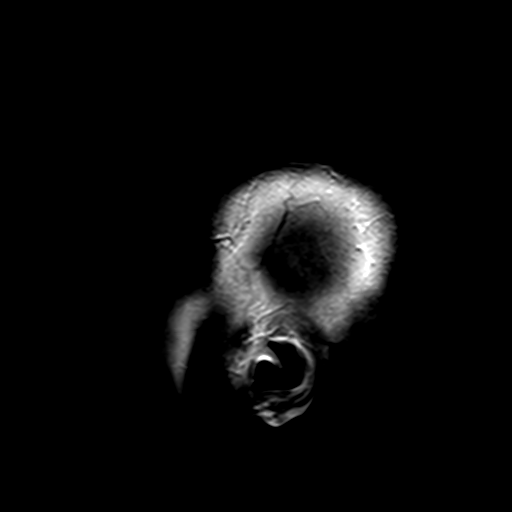

[48 of 48 positions shown; findings below may reference images not displayed]

FINDINGS: Brain: Right occipital craniotomy for tumor resection. Mild
encephalomalacia right lateral cerebellum. No recurrent tumor. No
enhancing mass or edema in the right cerebellum. No enhancing mass
elsewhere in the brain.

Ventricle size normal. 6 mm colloid cyst in the third ventricle
unchanged. Small area of encephalomalacia left cerebellum unchanged.
Negative for intracranial hemorrhage.

Vascular: Normal arterial flow voids at the skull base.

Skull and upper cervical spine: No skeletal mass lesion.

Sinuses/Orbits: Negative

Other: None
IMPRESSION: Stable MRI. No recurrent mass in the right posterior fossa at the
site of prior craniotomy.

## 2021-06-21 MED ORDER — GADOBENATE DIMEGLUMINE 529 MG/ML IV SOLN
15.0000 mL | Freq: Once | INTRAVENOUS | Status: AC | PRN
  Administered 2021-06-21: 15 mL via INTRAVENOUS

## 2021-06-23 ENCOUNTER — Inpatient Hospital Stay (HOSPITAL_BASED_OUTPATIENT_CLINIC_OR_DEPARTMENT_OTHER): Payer: BC Managed Care – PPO | Admitting: Hematology

## 2021-06-23 DIAGNOSIS — C884 Extranodal marginal zone B-cell lymphoma of mucosa-associated lymphoid tissue [MALT-lymphoma]: Secondary | ICD-10-CM | POA: Diagnosis not present

## 2021-06-23 DIAGNOSIS — Z5112 Encounter for antineoplastic immunotherapy: Secondary | ICD-10-CM | POA: Diagnosis not present

## 2021-06-30 ENCOUNTER — Encounter: Payer: Self-pay | Admitting: Hematology

## 2021-06-30 NOTE — Progress Notes (Addendum)
[facility] ?OFFICE PROGRESS NOTE ?DOS .06/23/2021 ? ?Amber Meiers, MD ?[address] ?[place] [id number] ? 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 ?CURRENT THERAPY: maintenance Rituxan q8weeks ? ?INTERVAL HISTORY: ? ?.I connected with Amber Drake on 03/24/2023at 11:20 AM EDT by telephone visit and verified that I am speaking with the correct person using two identifiers.  ? ?I discussed the limitations, risks, security and privacy concerns of performing an evaluation and management service by telemedicine and the availability of in-person appointments. I also discussed with the patient that there may be a patient responsible charge related to this service. The patient expressed understanding and agreed to proceed.  ? ?Other persons participating in the visit and their role in the encounter: None ? ?Patient?s location: Home ?Provider?s location: [facility] ? ?Chief Complaint: Review of MRI of the brain ? 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  She notes no acute new symptoms since her last clinic visit.  No new headaches or focal neurological deficits.  No new toxicities from her previous dose of Rituxan.  Patient has had an MRI of the head the results of which were not immediately available but was later reviewed with the patient. ? ?MRI brain on 06/21/2021 showed no evidence of recurrent mass in the posterior fossa at the site of her previous craniotomy and no other acute new lesions suggestive of lymphoma recurrence. ? ?MEDICAL HISTORY: ?Past Medical History:  ?Diagnosis Date  ? Anemia   ? Arthritis   ? Blood dyscrasia   ? sickle cell trait  ? Cancer [facility])   ? lymphoma  ? Carbuncle and furuncle   ? Hypercholesterolemia   ? Hypertension   ? does not take meds  ? Papanicolaou smear of cervix with positive high risk human  papilloma virus (HPV) test 08/23/2020  ? 08/23/20    [place] per ASCCP guidelines, immediate risk of CIN 3+ is 4.1 %  ? Sickle cell trait ([place])   ? ? ?ALLERGIES:  is allergic to penicillins, penicillins cross reactors, and tape. ? ?MEDICATIONS:  ?Current Outpatient Medications  ?Medication Sig Dispense Refill  ? atorvastatin (LIPITOR) 20 MG tablet Take 1 tablet (20 mg total) by mouth daily. 90 tablet 2  ? ibuprofen (ADVIL) 800 MG tablet Take 1 tablet (800 mg total) by mouth every 8 (eight) hours as needed. 30 tablet 0  ? lisinopril-hydrochlorothiazide (PRINZIDE,ZESTORETIC) 20-12.5 MG tablet Take 1 tablet by mouth daily.    ? meclizine (ANTIVERT) 12.5 MG tablet Take 1 tablet (12.5 mg total) by mouth 3 (three) times daily as needed for dizziness. 8 tablet 0  ? Multiple Vitamin (MULTIVITAMIN WITH MINERALS) TABS tablet Take 1 tablet by mouth daily.    ? naproxen (NAPROSYN) 500 MG tablet Take 1 tablet (500 mg total) by mouth 2 (two) times daily with a meal. 60 tablet 5  ? oxyCODONE (OXY IR/ROXICODONE) 5 MG immediate release tablet Take 5 mg by mouth every 6 (six) hours as needed for pain.    ? oxyCODONE 10 MG TABS Take 1 tablet (10 mg total) by mouth every 3 (three) hours as needed for severe pain ((score 7 to 10)). 30 tablet 0  ? potassium chloride SA (KLOR-CON) 20 MEQ tablet TAKE (1) TABLET BY MOUTH TWICE DAILY. 60 tablet 1  ? ?No current facility-administered medications for this visit.  ? ?Facility-Administered Medications Ordered in  Other Visits  ?Medication Dose Route Frequency Provider Last Rate Last Admin  ? acetaminophen (TYLENOL) tablet 500 mg  500 mg Oral Once Amber Genera, MD      ? And  ? oxyCODONE (Oxy IR/ROXICODONE) immediate release tablet 5 mg  5 mg Oral Once Amber Genera, MD      ? dexamethasone (DECADRON) 10 mg in sodium chloride 0.9 % 50 mL IVPB  10 mg Intravenous Once Amber Genera, MD      ? ? ?SURGICAL HISTORY:  ?Past Surgical History:  ?Procedure Laterality Date  ? APPENDECTOMY     ? APPLICATION OF CRANIAL NAVIGATION N/A 07/28/2018  ? Procedure: APPLICATION OF CRANIAL NAVIGATION;  Surgeon: Amber Kos, MD;  Location: [facility];  Service: Neurosurgery;  Laterality: N/A;  ? BRAIN SURGERY    ? CARPAL TUNNEL RELEASE  03/23/2011  ? Procedure: CARPAL TUNNEL RELEASE;  Surgeon: Amber Drake;  Location: [facility];  Service: Orthopedics;  Laterality: Left;  ? CARPAL TUNNEL RELEASE  05/10/2011  ? Procedure: CARPAL TUNNEL RELEASE;  Surgeon: Amber Kava, MD;  Location: [facility];  Service: Orthopedics;  Laterality: Right;  ? CESAREAN SECTION    ? x 2  ? COLONOSCOPY WITH PROPOFOL N/A 11/16/2020  ? Procedure: COLONOSCOPY WITH PROPOFOL;  Surgeon: Amber Quale, MD;  [facility];  Service: Gastroenterology;  Laterality: N/A;  9:15  ? INCISION AND DRAINAGE PERIRECTAL ABSCESS  12/21/09  ? LUMBAR LAMINECTOMY/DECOMPRESSION MICRODISCECTOMY Left 12/12/2020  ? Procedure: Laminectomy and Foraminotomy - left - L5-S1;  Surgeon: Amber Kos, MD;  Location: [facility];  Service: Neurosurgery;  Laterality: Left;  3C  ? PR DURAL GRAFT REPAIR,SPINE DEFECT N/A 07/28/2018  ? Procedure: Stereotactic open biopsy of Right cerebellar hemisphere and dura with brainlab;  Surgeon: Amber Kos, MD;  Location: [facility];  Service: Neurosurgery;  Laterality: N/A;  Stereotactic open biopsy of Right cerebellar hemisphere and dura with brainlab  ? PROCTOSCOPY  10/17/2010  ? Procedure: PROCTOSCOPY;  Surgeon: Amber Drake;  Location: [facility];  Service: General;  Laterality: N/A;  Rigid Proctoscopy/Possible Fistula in Ano ? ?Procedure ended at 1003  ? THERAPEUTIC ABORTION    ? x2  ? ? ?REVIEW OF SYSTEMS:   ?10 Point review of Systems was done is negative except as noted above. ? ? ?PHYSICAL EXAMINATION:  ?Last menstrual period 08/12/2010. ? ? ?LABORATORY DATA: ? ? ?  Latest Ref Rng & Units 06/19/2021  ?  8:50 PM 06/06/2021  ?  9:19 AM 05/05/2021  ?  8:10 PM  ?CBC  ?WBC 4.0 - 10.5 K/uL 7.5   11.6   8.1    ?Hemoglobin 12.0 - 15.0 g/dL 11.5    11.5   12.7    ?Hematocrit 36.0 - 46.0 % 34.8   34.1   37.4    ?Platelets 150 - 400 K/uL 379   380   375    ? ? ?. ? ?  Latest Ref Rng & Units 06/19/2021  ?  8:50 PM 06/06/2021  ?  9:19 AM 05/05/2021  ?  8:10 PM  ?CMP  ?Glucose 70 - 99 mg/dL 152   129   102    ?BUN 6 - 20 mg/dL '14   17   11    '$ ?Creatinine 0.44 - 1.00 mg/dL 0.79   0.74   0.89    ?Sodium 135 - 145 mmol/L 138   137   135    ?Potassium 3.5 - 5.1 mmol/L 3.3   3.6  3.4    ?Chloride 98 - 111 mmol/L 105   104   102    ?CO2 22 - 32 mmol/L '24   26   28    '$ ?Calcium 8.9 - 10.3 mg/dL 9.2   10.1   9.4    ?Total Protein 6.5 - 8.1 g/dL 6.7   7.1   7.4    ?Total Bilirubin 0.3 - 1.2 mg/dL 0.4   0.2   0.2    ?Alkaline Phos 38 - 126 U/L 87   86   68    ?AST 15 - 41 U/L '30   26   21    '$ ?ALT 0 - 44 U/L 44   42   16    ? ? ?. ?Lab Results  ?Component Value Date  ? LDH 151 06/06/2021  ? ? ?RADIOGRAPHIC STUDIES: ? ?MR BRAIN W WO CONTRAST ? ?Result Date: 06/23/2021 ?CLINICAL DATA:  Follow-up treatment response. History of meningeal extranodal marginal zone lymphoma EXAM: MRI HEAD WITHOUT AND WITH CONTRAST TECHNIQUE: Multiplanar, multiecho pulse sequences of the brain and surrounding structures were obtained without and with intravenous contrast. CONTRAST:  41m MULTIHANCE GADOBENATE DIMEGLUMINE 529 MG/ML IV SOLN COMPARISON:  MRI brain 06/14/2020 FINDINGS: Brain: Right occipital craniotomy for tumor resection. Mild encephalomalacia right lateral cerebellum. No recurrent tumor. No enhancing mass or edema in the right cerebellum. No enhancing mass elsewhere in the brain. Ventricle size normal. 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 Small area of encephalomalacia left cerebellum unchanged. Negative for intracranial hemorrhage. Vascular: Normal arterial flow voids at the skull base. Skull and upper cervical spine: No skeletal mass lesion. Sinuses/Orbits: Negative Other: None IMPRESSION: Stable MRI. No recurrent mass in the right posterior fossa at the site of prior  craniotomy. Electronically Signed   By: CFranchot GalloM.D.   On: 06/23/2021 13:08  ? ?DG FLUORO GUIDED NEEDLE PLC ASPIRATION/INJECTION LOC ? ?Result Date: 06/05/2021 ?CLINICAL DATA:  Chronic RIGHT shoulder pa

## 2021-07-18 ENCOUNTER — Encounter: Payer: Self-pay | Admitting: Orthopedic Surgery

## 2021-07-18 ENCOUNTER — Ambulatory Visit (INDEPENDENT_AMBULATORY_CARE_PROVIDER_SITE_OTHER): Payer: BC Managed Care – PPO | Admitting: Orthopedic Surgery

## 2021-07-18 VITALS — Ht 64.0 in | Wt 166.0 lb

## 2021-07-18 DIAGNOSIS — M75111 Incomplete rotator cuff tear or rupture of right shoulder, not specified as traumatic: Secondary | ICD-10-CM | POA: Diagnosis not present

## 2021-07-19 ENCOUNTER — Encounter: Payer: Self-pay | Admitting: Orthopedic Surgery

## 2021-07-19 NOTE — Progress Notes (Addendum)
Return patient Visit  Assessment: Amber Drake is a 61 y.o. female with the following: Chronic right shoulder pain; partial thickness rotator cuff injury  Plan: Right shoulder glenohumeral injection improved her symptoms for approximately 1 month.  Since then, the pain has returned.  She is tearful in clinic today.  She is interested in surgery if the think it would benefit her.  We reviewed the results of her MRI, which demonstrates some partial-thickness tearing, as well as delamination of the rotator cuff.  She could potentially benefit from arthroscopic surgery, to include rotator cuff repair and biceps tenotomy versus tenodesis.  She is in agreement with this plan.  Surgery was discussed, all questions were answered.  She will obtain surgical clearance.  Once this has been finalized, we can work on scheduling a surgical date.  Follow-up: Return for After medical clearance for OR.  Subjective:  Chief Complaint  Patient presents with   Shoulder Pain    Rt shoulder pain getting worse over past week. States she has pain shooting into her neck.     History of Present Illness: Amber Drake is a 61 y.o. female who presents for repeat evaluation of right shoulder pain.  I last saw her approximately 6 weeks ago.  At that time, we placed an order for a right shoulder glenohumeral joint injection.  This improved her symptoms for approximately month.  Over the past week, she has noted worsening pain in the shoulder.  She now having some pain radiating into her neck.  Pain gets worse at night, when she is at work.   Review of Systems: No fevers or chills No numbness or tingling No chest pain No shortness of breath No bowel or bladder dysfunction No GI distress No headaches   Objective: Ht 5\' 4"  (1.626 m)   Wt 166 lb (75.3 kg)   LMP 08/12/2010   BMI 28.49 kg/m   Physical Exam:  General: Alert and oriented. and No acute distress. Gait: Normal gait.  Evaluation of right  shoulder demonstrates no deformity.  No atrophy is appreciated.  130 degrees of forward elevation, with pain beyond this range of motion.  Abduction at her side to 90 degrees.  Internal rotation to her lower back.  Supraspinatus strength in the right shoulder is 4/5.  Positive Jobe's.  Positive drop arm test.  Positive O'Brien's. Negative belly press.  IMAGING: I personally reviewed images previously obtained in clinic  MRI of the right shoulder demonstrates high-grade partial-thickness tearing of the rotator cuff tendons.  There are some small areas of full-thickness tearing, at the footprint of the tendons.  She also has a SLAP tear.  Mild degenerative changes noted within the glenohumeral joint.   New Medications:  No orders of the defined types were placed in this encounter.     Oliver Barre, MD  07/19/2021 1:56 PM

## 2021-07-25 ENCOUNTER — Ambulatory Visit: Payer: BC Managed Care – PPO | Admitting: Orthopedic Surgery

## 2021-07-25 ENCOUNTER — Telehealth: Payer: Self-pay | Admitting: Radiology

## 2021-07-25 NOTE — Telephone Encounter (Signed)
Patient called, LMVM asking if her surgery was scheduled yet. Please call.   ?

## 2021-07-26 ENCOUNTER — Other Ambulatory Visit: Payer: Self-pay

## 2021-07-26 ENCOUNTER — Emergency Department (HOSPITAL_COMMUNITY)
Admission: EM | Admit: 2021-07-26 | Discharge: 2021-07-26 | Disposition: A | Discharge status: Home / Self Care | Payer: BC Managed Care – PPO | Attending: Emergency Medicine | Admitting: Emergency Medicine

## 2021-07-26 ENCOUNTER — Telehealth: Payer: Self-pay | Admitting: Orthopedic Surgery

## 2021-07-26 ENCOUNTER — Telehealth: Payer: Self-pay | Admitting: Radiology

## 2021-07-26 ENCOUNTER — Encounter (HOSPITAL_COMMUNITY): Payer: Self-pay

## 2021-07-26 ENCOUNTER — Emergency Department (HOSPITAL_COMMUNITY): Payer: BC Managed Care – PPO

## 2021-07-26 DIAGNOSIS — R0981 Nasal congestion: Secondary | ICD-10-CM | POA: Insufficient documentation

## 2021-07-26 DIAGNOSIS — R490 Dysphonia: Secondary | ICD-10-CM | POA: Insufficient documentation

## 2021-07-26 DIAGNOSIS — M75111 Incomplete rotator cuff tear or rupture of right shoulder, not specified as traumatic: Secondary | ICD-10-CM

## 2021-07-26 DIAGNOSIS — F17200 Nicotine dependence, unspecified, uncomplicated: Secondary | ICD-10-CM | POA: Insufficient documentation

## 2021-07-26 DIAGNOSIS — M791 Myalgia, unspecified site: Secondary | ICD-10-CM | POA: Diagnosis not present

## 2021-07-26 DIAGNOSIS — R051 Acute cough: Secondary | ICD-10-CM | POA: Diagnosis not present

## 2021-07-26 DIAGNOSIS — R059 Cough, unspecified: Secondary | ICD-10-CM | POA: Diagnosis not present

## 2021-07-26 DIAGNOSIS — G8929 Other chronic pain: Secondary | ICD-10-CM

## 2021-07-26 IMAGING — DX DG CHEST 2V
2 series · 2 of 2 positions shown · non-contrast
Comparison: None.

CLINICAL DATA: smoker, cough, congestion

EXAM:
CHEST - 2 VIEW

[chest pa]
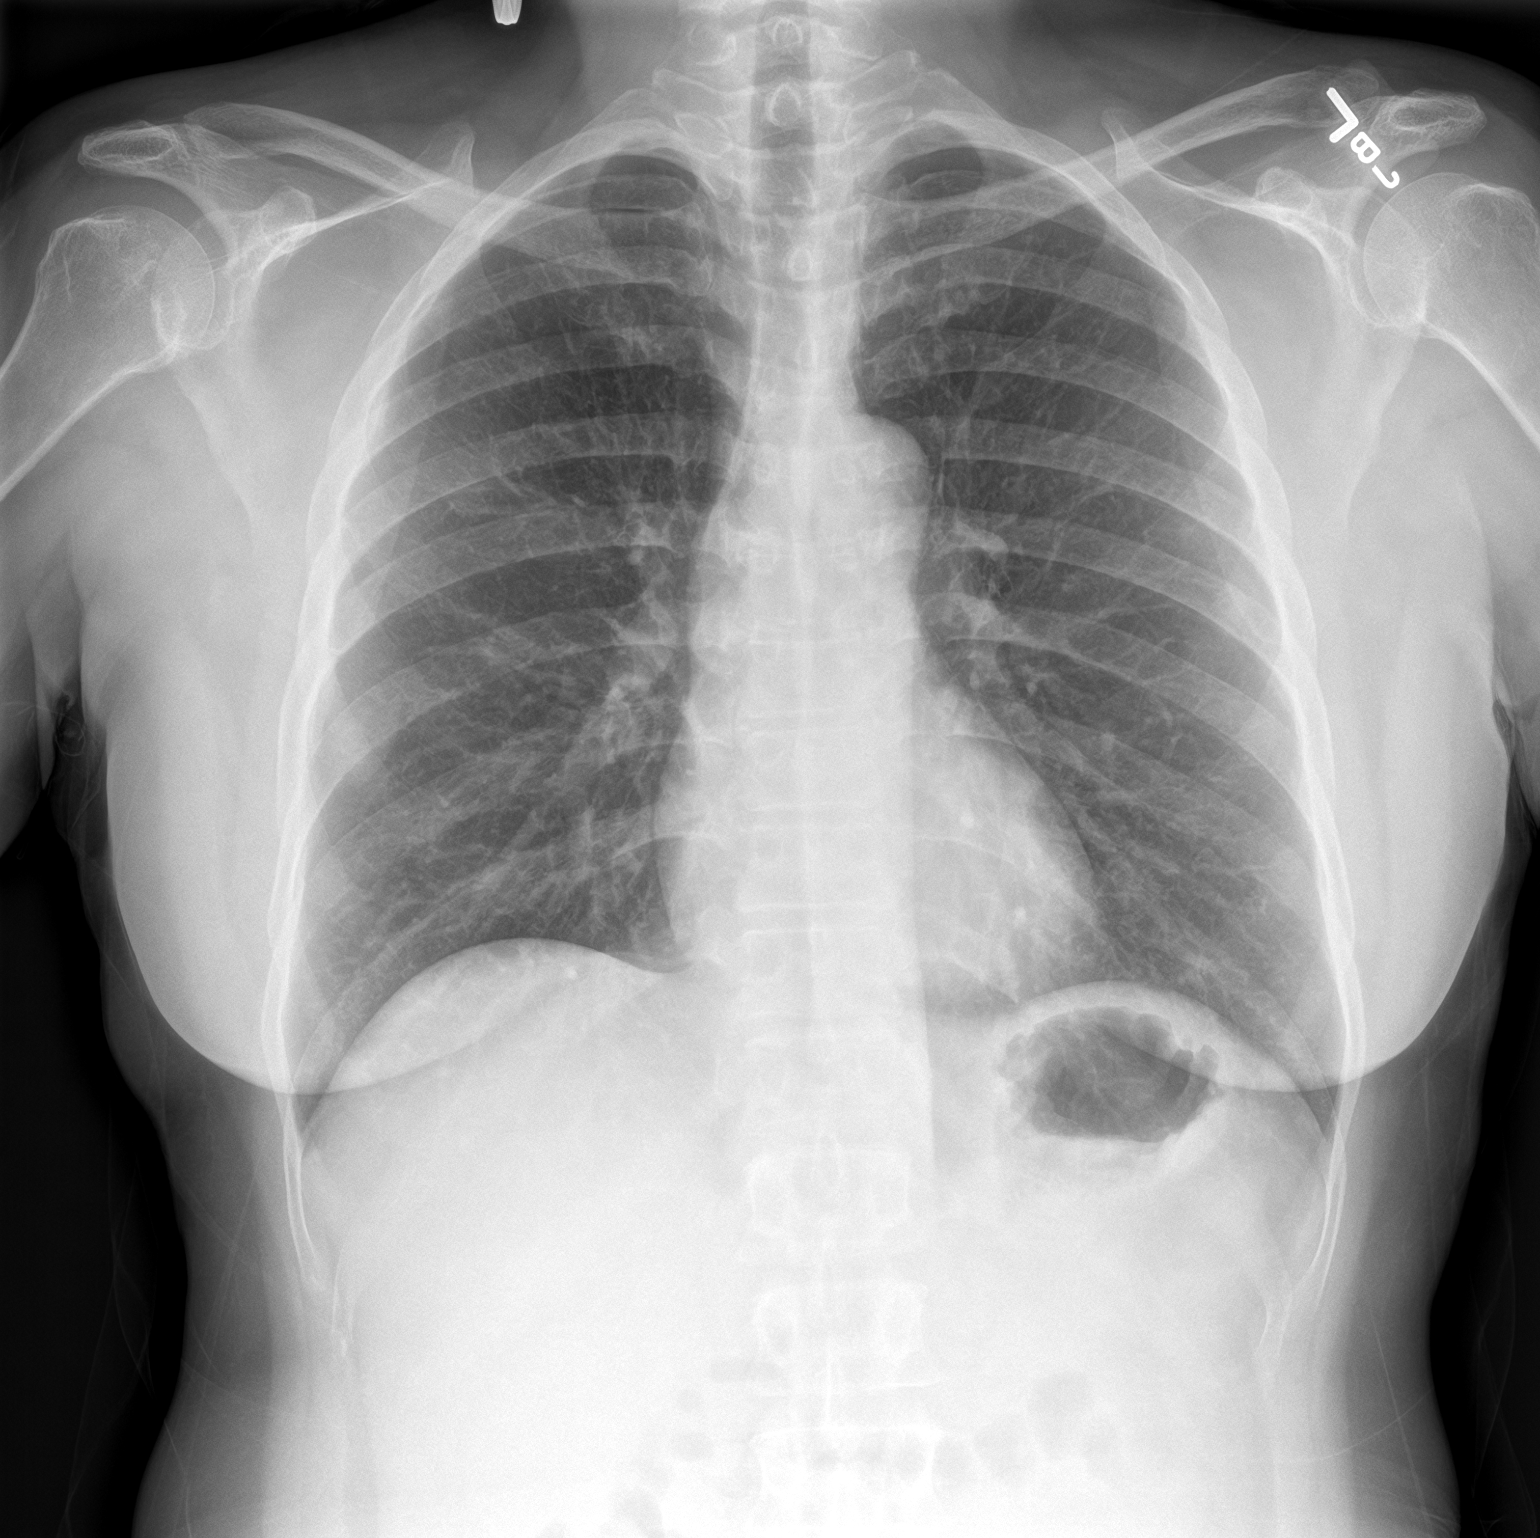

[chest lat]
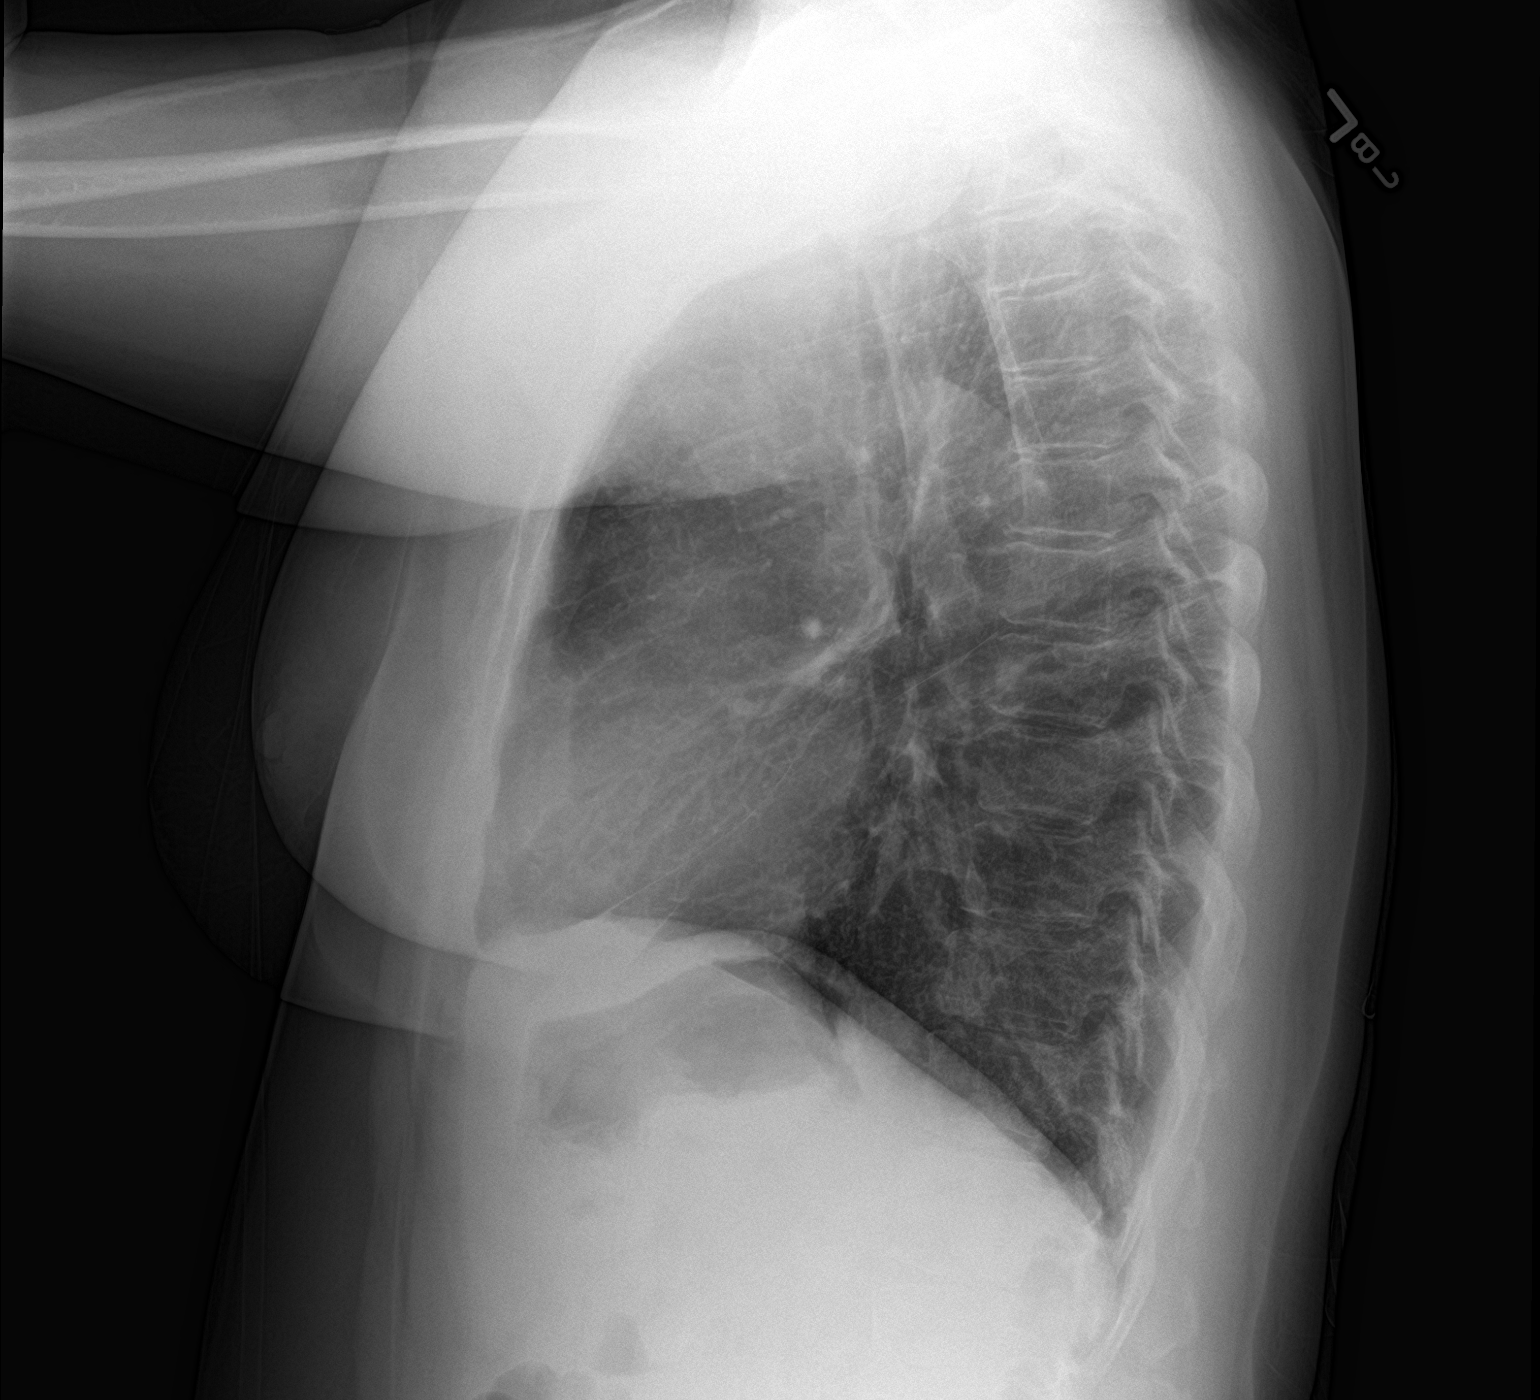

[2 of 2 positions shown; findings below may reference images not displayed]

FINDINGS: The heart size and mediastinal contours are within normal limits.
Both lungs are clear. The visualized skeletal structures are
unremarkable.
IMPRESSION: No active cardiopulmonary disease.

## 2021-07-26 MED ORDER — AZITHROMYCIN 250 MG PO TABS: 250.0000 mg | ORAL_TABLET | Freq: Every day | ORAL | 0 refills | Status: AC

## 2021-07-26 MED ORDER — PREDNISONE 50 MG PO TABS
60.0000 mg | ORAL_TABLET | ORAL | Status: AC
  Administered 2021-07-26: 60 mg via ORAL
  Filled 2021-07-26: qty 1

## 2021-07-26 MED ORDER — PREDNISONE 20 MG PO TABS: 40.0000 mg | ORAL_TABLET | Freq: Every day | ORAL | 0 refills | Status: DC

## 2021-07-26 MED ORDER — AZITHROMYCIN 250 MG PO TABS
500.0000 mg | ORAL_TABLET | Freq: Once | ORAL | Status: AC
  Administered 2021-07-26: 500 mg via ORAL
  Filled 2021-07-26: qty 2

## 2021-07-26 NOTE — Discharge Instructions (Signed)
As discussed, your evaluation today has been largely reassuring.  But, it is important that you monitor your condition carefully, and do not hesitate to return to the ED if you develop new, or concerning changes in your condition. ? ?Otherwise, please follow-up with your physician for appropriate ongoing care. ? ?

## 2021-07-26 NOTE — Telephone Encounter (Signed)
Patient called, LMVM inquiring about her forms.  I called her back, LMVM to call me to discuss.  I do not currently have any forms on this patient.  ?

## 2021-07-26 NOTE — ED Triage Notes (Signed)
Reports cough with nasal congestion x 2 days.  Resp even and unlabored.  Skin warm and dry.  Denies fever.   ?

## 2021-07-26 NOTE — Telephone Encounter (Signed)
Patient came in the office and wants to know when is her surgery please call her back at [phone]  ?

## 2021-07-26 NOTE — ED Provider Notes (Signed)
?[facility] ?Provider Note ? ? ?CSN: [id number] ?Arrival date & time: 07/26/21  1708 ? ?  ? ?History ? ?Chief Complaint  ?Patient presents with  ? Cough  ? ? ?Amber Drake is a 61 y.o. female. ? ?HPI ?Patient presents with 1 day of cough, congestion, hoarse voice.  Patient is a smoker, but was well prior to the onset of symptoms.  Since onset no relief with anything. ?No fever, vomiting, syncope, other focal pain. ?  ? ?Home Medications ?Prior to Admission medications   ?Medication Sig Start Date End Date Taking? Authorizing Provider  ?azithromycin (ZITHROMAX) 250 MG tablet Take 1 tablet (250 mg total) by mouth daily for 4 days. Take 1 every day until finished. 07/26/21 07/30/21 Yes Carmin Muskrat, MD  ?predniSONE (DELTASONE) 20 MG tablet Take 2 tablets (40 mg total) by mouth daily with breakfast. For the next four days 07/26/21  Yes Carmin Muskrat, MD  ?atorvastatin (LIPITOR) 20 MG tablet Take 1 tablet (20 mg total) by mouth daily. 03/14/15   Soyla Dryer, PA-C  ?ibuprofen (ADVIL) 800 MG tablet Take 1 tablet (800 mg total) by mouth every 8 (eight) hours as needed. 06/19/21   Fransico Meadow, PA-C  ?lisinopril-hydrochlorothiazide (PRINZIDE,ZESTORETIC) 20-12.5 MG tablet Take 1 tablet by mouth daily.    [provider]  ?meclizine (ANTIVERT) 12.5 MG tablet Take 1 tablet (12.5 mg total) by mouth 3 (three) times daily as needed for dizziness. 09/25/20   Davonna Belling, MD  ?Multiple Vitamin (MULTIVITAMIN WITH MINERALS) TABS tablet Take 1 tablet by mouth daily.    [provider]  ?naproxen (NAPROSYN) 500 MG tablet Take 1 tablet (500 mg total) by mouth 2 (two) times daily with a meal. 03/21/21   Sanjuana Kava, MD  ?oxyCODONE (OXY IR/ROXICODONE) 5 MG immediate release tablet Take 5 mg by mouth every 6 (six) hours as needed for pain. 11/17/20   [provider]  ?potassium chloride SA (KLOR-CON) 20 MEQ tablet TAKE (1) TABLET BY MOUTH TWICE DAILY. 01/31/21   Brunetta Genera, MD  ?   ? ?Allergies    ?Penicillins, Penicillins cross reactors, and Tape   ? ?Review of Systems   ?Review of Systems  ?All other systems reviewed and are negative. ? ?Physical Exam ?Updated Vital Signs ?BP 134/62 (BP Location: Right Arm)   Pulse 73   Temp 97.9 ?F (36.6 ?C) (Oral)   Resp 16   Ht '5\' 4"'$  (1.626 m)   Wt 74.8 kg   LMP 08/12/2010   SpO2 99%   BMI 28.32 kg/m?  ?Physical Exam ?Vitals and nursing note reviewed.  ?Constitutional:   ?   General: She is not in acute distress. ?   Appearance: She is well-developed.  ?HENT:  ?   Head: Normocephalic and atraumatic.  ?Eyes:  ?   Conjunctiva/sclera: Conjunctivae normal.  ?Cardiovascular:  ?   Rate and Rhythm: Normal rate and regular rhythm.  ?Pulmonary:  ?   Effort: Pulmonary effort is normal. No respiratory distress.  ?   Breath sounds: Normal breath sounds. No stridor.  ?Abdominal:  ?   General: There is no distension.  ?Skin: ?   General: Skin is warm and dry.  ?Neurological:  ?   Mental Status: She is alert and oriented to person, place, and time.  ?   Cranial Nerves: No cranial nerve deficit.  ?Psychiatric:     ?   Mood and Affect: Mood normal.  ? ? ?ED Results / Procedures / Treatments   ?  Labs ?(all labs ordered are listed, but only abnormal results are displayed) ?Labs Reviewed - No data to display ? ?EKG ?None ? ?Radiology ?DG Chest 2 View ? ?Result Date: 07/26/2021 ?CLINICAL DATA:  smoker, cough, congestion EXAM: CHEST - 2 VIEW COMPARISON:  None. FINDINGS: The heart size and mediastinal contours are within normal limits. Both lungs are clear. The visualized skeletal structures are unremarkable. IMPRESSION: No active cardiopulmonary disease. Electronically Signed   By: Fidela Salisbury M.D.   On: 07/26/2021 18:28   ? ?Procedures ?Procedures  ? ? ?Medications Ordered in ED ?Medications  ?predniSONE (DELTASONE) tablet 60 mg (has no administration in time range)  ?azithromycin (ZITHROMAX) tablet 500 mg (has no administration in time range)   ? ? ?ED Course/ Medical Decision Making/ A&P ?This patient with a Hx of cigarette addiction presents to the ED for concern of cough, hoarse voice, congestion, this involves an extensive number of treatment options, and is a complaint that carries with it a high risk of complications and morbidity.   ? ?The differential diagnosis includes pneumonia, bacteremia, sepsis ? ? ?Social Determinants of Health: ? ?Cigarette addiction ? ?Additional history obtained: ? ?Additional history and/or information obtained from family member, notable for HPI as above ? ? ?After the initial evaluation, orders, including: X-ray were initiated. ? ? ?The patient was also maintained on pulse oximetry. The readings were typically 99% room air ? ? ?On repeat evaluation of the patient stayed the same ? ?Imaging Studies ordered: ? ?I independently visualized and interpreted imaging which showed no pneumonia ?I agree with the radiologist interpretation ? ?Dispostion / Final MDM: ? ?After consideration of the diagnostic results and the patient's response to treatment, dull female with cigarette addiction presents with cough, congestion for 1 day.  Patient's presentation consistent with bronchitis versus early pneumonia versus COPD exacerbation, elevated risk profile given her cigarette addiction.  Patient started on antibiotics, steroids, will follow-up with primary care, no evidence for bacteremia, sepsis, pneumothorax, substantial pneumonia. ? ?Final Clinical Impression(s) / ED Diagnoses ?Final diagnoses:  ?Acute cough  ? ? ?Rx / DC Orders ?ED Discharge Orders   ? ?      Ordered  ?  predniSONE (DELTASONE) 20 MG tablet  Daily with breakfast       ? 07/26/21 1839  ?  azithromycin (ZITHROMAX) 250 MG tablet  Daily       ? 07/26/21 1839  ? ?  ?  ? ?  ? ? ?  ?Carmin Muskrat, MD ?07/26/21 1842 ? ?

## 2021-07-27 DIAGNOSIS — M5416 Radiculopathy, lumbar region: Secondary | ICD-10-CM | POA: Diagnosis not present

## 2021-07-27 DIAGNOSIS — I10 Essential (primary) hypertension: Secondary | ICD-10-CM | POA: Diagnosis not present

## 2021-07-27 DIAGNOSIS — M48061 Spinal stenosis, lumbar region without neurogenic claudication: Secondary | ICD-10-CM | POA: Diagnosis not present

## 2021-07-27 DIAGNOSIS — E785 Hyperlipidemia, unspecified: Secondary | ICD-10-CM | POA: Diagnosis not present

## 2021-07-27 DIAGNOSIS — F1120 Opioid dependence, uncomplicated: Secondary | ICD-10-CM | POA: Diagnosis not present

## 2021-07-27 DIAGNOSIS — R739 Hyperglycemia, unspecified: Secondary | ICD-10-CM | POA: Diagnosis not present

## 2021-07-27 DIAGNOSIS — M25511 Pain in right shoulder: Secondary | ICD-10-CM | POA: Diagnosis not present

## 2021-07-27 DIAGNOSIS — Z1159 Encounter for screening for other viral diseases: Secondary | ICD-10-CM | POA: Diagnosis not present

## 2021-07-27 NOTE — Telephone Encounter (Signed)
Spoke with pt and let her know that clearance is needed from Dr. Legrand Rams before we can get her scheduled. Let her know that letter was sent yesterday. Pt states she will contact Dr. Josephine Cables office to get letter completed.  ?

## 2021-07-27 NOTE — Telephone Encounter (Signed)
Letter recently sent, will call and talk to patient when time allows.  ?

## 2021-07-28 ENCOUNTER — Encounter: Payer: Self-pay | Admitting: Orthopedic Surgery

## 2021-07-28 DIAGNOSIS — E785 Hyperlipidemia, unspecified: Secondary | ICD-10-CM | POA: Diagnosis not present

## 2021-07-28 DIAGNOSIS — F17210 Nicotine dependence, cigarettes, uncomplicated: Secondary | ICD-10-CM | POA: Diagnosis not present

## 2021-07-28 DIAGNOSIS — I10 Essential (primary) hypertension: Secondary | ICD-10-CM | POA: Diagnosis not present

## 2021-07-28 DIAGNOSIS — Z0001 Encounter for general adult medical examination with abnormal findings: Secondary | ICD-10-CM | POA: Diagnosis not present

## 2021-07-28 DIAGNOSIS — C884 Extranodal marginal zone B-cell lymphoma of mucosa-associated lymphoid tissue [MALT-lymphoma]: Secondary | ICD-10-CM | POA: Diagnosis not present

## 2021-07-31 NOTE — Telephone Encounter (Signed)
Patient brought letter last Friday morning, there are 2 copies in Leta box and the patient came in the office again today with another copy of the letter.  I have put this one in Dr. Amedeo Kinsman  inbox for review.  ?

## 2021-08-01 ENCOUNTER — Encounter: Payer: Self-pay | Admitting: Orthopedic Surgery

## 2021-08-01 NOTE — Telephone Encounter (Signed)
Called pt to let her know clearance letter has been received. Let pt know that we're going to get her surgery approved by insurance first, but we're aiming to do surgery 5/15 ish. Verbalized understanding and states she may need a letter extension to be out of work.  ?

## 2021-08-02 ENCOUNTER — Ambulatory Visit: Payer: BC Managed Care – PPO | Admitting: Orthopedic Surgery

## 2021-08-05 ENCOUNTER — Encounter (HOSPITAL_COMMUNITY): Payer: Self-pay | Admitting: Emergency Medicine

## 2021-08-05 ENCOUNTER — Other Ambulatory Visit: Payer: Self-pay

## 2021-08-05 ENCOUNTER — Emergency Department (HOSPITAL_COMMUNITY)
Admission: EM | Admit: 2021-08-05 | Discharge: 2021-08-05 | Disposition: A | Discharge status: Home / Self Care | Payer: BC Managed Care – PPO | Attending: Emergency Medicine | Admitting: Emergency Medicine

## 2021-08-05 DIAGNOSIS — H5789 Other specified disorders of eye and adnexa: Secondary | ICD-10-CM | POA: Diagnosis not present

## 2021-08-05 MED ORDER — FLUORESCEIN SODIUM 1 MG OP STRP
1.0000 | ORAL_STRIP | Freq: Once | OPHTHALMIC | Status: AC
  Administered 2021-08-05: 1 via OPHTHALMIC
  Filled 2021-08-05: qty 1

## 2021-08-05 MED ORDER — ERYTHROMYCIN 5 MG/GM OP OINT: TOPICAL_OINTMENT | OPHTHALMIC | 0 refills | Status: DC

## 2021-08-05 NOTE — ED Provider Notes (Signed)
?[facility] ?Provider Note ? ? ?CSN: [id number] ?Arrival date & time: 08/05/21  0732 ? ?  ? ?History ? ?Chief Complaint  ?Patient presents with  ? Eye Problem  ? ? ?Amber Drake is a 61 y.o. female. ? ?HPI ? ?Patient with out significant medical history presents with complaints of eye redness.  Patient states that she was getting fake eyelashes placed on yesterday and after the procedure she notes that she started to have some redness around her eyes, she states happened in both eyes simultaneously, states that she is having some clearish drainage, and states that she has redness around the eyes.  She denies any gritty like sensation within her eyes denies difficulty moving her eyes no change in vision, she denies tongue throat lip swelling difficulty breathing GI symptoms.  Patient states that she has had this happen to her in the past and she was just given cream and then it went away.  She states that she does not wear contacts and she currently has the eyelashes on she does not want to remove them. ? ? ? ?Home Medications ?Prior to Admission medications   ?Medication Sig Start Date End Date Taking? Authorizing Provider  ?erythromycin ophthalmic ointment Place a 1/2 inch ribbon of ointment into the lower eyelid.  4 times a day for next 7 days. 08/05/21  Yes Marcello Fennel, PA-C  ?atorvastatin (LIPITOR) 20 MG tablet Take 1 tablet (20 mg total) by mouth daily. 03/14/15   Soyla Dryer, PA-C  ?ibuprofen (ADVIL) 800 MG tablet Take 1 tablet (800 mg total) by mouth every 8 (eight) hours as needed. 06/19/21   Fransico Meadow, PA-C  ?lisinopril-hydrochlorothiazide (PRINZIDE,ZESTORETIC) 20-12.5 MG tablet Take 1 tablet by mouth daily.    [provider]  ?meclizine (ANTIVERT) 12.5 MG tablet Take 1 tablet (12.5 mg total) by mouth 3 (three) times daily as needed for dizziness. 09/25/20   Davonna Belling, MD  ?Multiple Vitamin (MULTIVITAMIN WITH MINERALS) TABS tablet Take 1 tablet by  mouth daily.    [provider]  ?naproxen (NAPROSYN) 500 MG tablet Take 1 tablet (500 mg total) by mouth 2 (two) times daily with a meal. 03/21/21   Sanjuana Kava, MD  ?oxyCODONE (OXY IR/ROXICODONE) 5 MG immediate release tablet Take 5 mg by mouth every 6 (six) hours as needed for pain. 11/17/20   [provider]  ?potassium chloride SA (KLOR-CON) 20 MEQ tablet TAKE (1) TABLET BY MOUTH TWICE DAILY. 01/31/21   Brunetta Genera, MD  ?predniSONE (DELTASONE) 20 MG tablet Take 2 tablets (40 mg total) by mouth daily with breakfast. For the next four days 07/26/21   Carmin Muskrat, MD  ?   ? ?Allergies    ?Penicillins, Penicillins cross reactors, and Tape   ? ?Review of Systems   ?Review of Systems  ?Constitutional:  Negative for chills and fever.  ?Eyes:  Positive for redness.  ?Respiratory:  Negative for shortness of breath.   ?Cardiovascular:  Negative for chest pain.  ?Gastrointestinal:  Negative for abdominal pain.  ?Neurological:  Negative for headaches.  ? ?Physical Exam ?Updated Vital Signs ?BP 105/79 (BP Location: Right Arm)   Pulse 70   Temp 98.1 ?F (36.7 ?C) (Oral)   Resp 19   Ht '5\' 4"'$  (1.626 m)   Wt 74.8 kg   LMP 08/12/2010   SpO2 100%   BMI 28.32 kg/m?  ?Physical Exam ?Vitals and nursing note reviewed.  ?Constitutional:   ?   General: She is  not in acute distress. ?   Appearance: She is not ill-appearing.  ?HENT:  ?   Head: Normocephalic and atraumatic.  ?   Nose: No congestion.  ?Eyes:  ?   Extraocular Movements: Extraocular movements intact.  ?   Conjunctiva/sclera: Conjunctivae normal.  ?   Pupils: Pupils are equal, round, and reactive to light.  ?   Comments: No periorbital swelling, no overlying skin changes, no discharge or drainage present my exam, PERRLA, EOMs fully intact, she does have noted scleral injection bilaterally, no dendritic lesion present, no blood in the anterior chamber the eye.  ?Cardiovascular:  ?   Rate and Rhythm: Normal rate and regular rhythm.  ?    Pulses: Normal pulses.  ?   Heart sounds: No murmur heard. ?  No friction rub. No gallop.  ?Pulmonary:  ?   Effort: No respiratory distress.  ?   Breath sounds: No wheezing, rhonchi or rales.  ?Skin: ?   General: Skin is warm and dry.  ?Neurological:  ?   Mental Status: She is alert.  ?Psychiatric:     ?   Mood and Affect: Mood normal.  ? ? ?ED Results / Procedures / Treatments   ?Labs ?(all labs ordered are listed, but only abnormal results are displayed) ?Labs Reviewed - No data to display ? ?EKG ?None ? ?Radiology ?No results found. ? ?Procedures ?Procedures  ? ? ?Medications Ordered in ED ?Medications  ?fluorescein ophthalmic strip 1 strip (1 strip Both Eyes Given 08/05/21 0941)  ? ? ?ED Course/ Medical Decision Making/ A&P ?  ?                        ?Medical Decision Making ?Risk ?Prescription drug management. ? ? ?This patient presents to the ED for concern of eye redness, this involves an extensive number of treatment options, and is a complaint that carries with it a high risk of complications and morbidity.  The differential diagnosis includes viral/bacterial conjunctivitis, corneal abrasion, ulceration ? ? ? ?Additional history obtained: ? ?Additional history obtained from N/A ?External records from outside source obtained and reviewed including N/A ? ? ?Co morbidities that complicate the patient evaluation ? ?N/A ? ?Social Determinants of Health: ? ?N/A ? ? ? ?Lab Tests: ? ?I Ordered, and personally interpreted labs.  The pertinent results include:  ? ?Visual Acuity ? ? ?Right Eye Near: R Near: 20/13 ?Left Eye Near:  L Near: 20/13 ?Bilateral Near:  20/15 ? ?Imaging Studies ordered: ? ?I ordered imaging studies including N/A ?I independently visualized and interpreted imaging which showed N/A ?I agree with the radiologist interpretation ? ? ?Cardiac Monitoring: ? ?The patient was maintained on a cardiac monitor.  I personally viewed and interpreted the cardiac monitored which showed an underlying rhythm of:  N/A ? ? ?Medicines ordered and prescription drug management: ? ?I ordered medication including fluorescein stain ?I have reviewed the patients home medicines and have made adjustments as needed ? ?Critical Interventions: ? ?N/A ? ? ?Reevaluation: ? ?Presents with red eyes, patient noted sclera injection without drainage or discharge present, no perioral orbital swelling, no systemic symptoms, will obtain visual acuity, and perform Woods lamp exam for further evaluation ? ? ?Woods lamp was performed, no obvious ulceration, no fluorescein uptake, patient visual acuity was obtained and is unremarkable, she is ready for discharge at this time. ? ?Consultations Obtained: ? ?N/A ? ? ? ?Test Considered: ? ?CT maxillofacial will defer as my suspicion for maxillofacial  infection is very low at this time.  ? ? ? ?Rule out ?I have low suspicion for orbital cellulitis or periseptal cellulitis as patient had no pain with EOMs, there is no induration, fluctuance noted on exam around eyes.  Low suspicion for keratitis as patient does not wear contacts, eyes were visualized there is no visible dendritic lesion noted, there is no severe amount of purulent discharge noted.  Low suspicion for hyphema as patient denies recent trauma to the area no blood noted in the anterior chamber.  Low suspicion for corneal abrasion as there is no fluorescein uptake noted on Woods lamp exam.  I have low suspicion for anaphylaxis as she has no full body rash no tongue throat lip swelling difficulty breathing no GI symptoms.  I suspect patient suffering from viral conjunctivitis as started on the left side and transferred to the right side but cannot fully rule out bacterial conjunctivitis.  Will start patient on antibiotics and follow-up with ophthalmologist for further evaluation. ? ? ? ? ?Dispostion and problem list ? ?After consideration of the diagnostic results and the patients response to treatment, I feel that the patent would benefit from  discharge. ? ?Eye redness-likely this is allergic conjunctivitis secondary due to the eyelashes, I recommend she remove the eyelashes when she feels ready, will place her on antibiotics to cover for possible underlyi

## 2021-08-05 NOTE — Discharge Instructions (Signed)
Likely your eyes are irritated from the eyelashes, I do recommend removing them as soon as possible.  I have started you on antibiotics please take as prescribed.  Also would like you to start taking Claritin as well as Pepcid once a day for the next 7 days,  you can buy over-the-counter.  I also recommend obtaining antihistamine eyedrops which  can find over-the-counter please asked the pharmacist when you go to pick up your medication  ? ?Please follow-up with the eye doctor for reevaluation given the contact number please call Monday for a follow-up appointment. ? ?Come back to the emergency department if you develop chest pain, shortness of breath, severe abdominal pain, uncontrolled nausea, vomiting, diarrhea. ? ?

## 2021-08-05 NOTE — ED Triage Notes (Signed)
Pt to the ED with complaints of eye redness and tearing after getting glue on eyelashes. ? ?VSS  ?

## 2021-08-07 DIAGNOSIS — H109 Unspecified conjunctivitis: Secondary | ICD-10-CM | POA: Diagnosis not present

## 2021-08-07 DIAGNOSIS — L02225 Furuncle of perineum: Secondary | ICD-10-CM | POA: Diagnosis not present

## 2021-08-08 ENCOUNTER — Inpatient Hospital Stay: Payer: BC Managed Care – PPO

## 2021-08-08 ENCOUNTER — Inpatient Hospital Stay: Payer: BC Managed Care – PPO | Attending: Hematology | Admitting: Hematology

## 2021-08-08 ENCOUNTER — Other Ambulatory Visit: Payer: Self-pay

## 2021-08-08 VITALS — BP 100/59 | HR 87 | Temp 97.9°F | Resp 20 | Wt 164.8 lb

## 2021-08-08 DIAGNOSIS — I10 Essential (primary) hypertension: Secondary | ICD-10-CM | POA: Diagnosis not present

## 2021-08-08 DIAGNOSIS — C884 Extranodal marginal zone b-cell lymphoma of mucosa-associated lymphoid tissue (malt-lymphoma) not having achieved remission: Secondary | ICD-10-CM

## 2021-08-08 DIAGNOSIS — Z7189 Other specified counseling: Secondary | ICD-10-CM | POA: Diagnosis not present

## 2021-08-08 LAB — CBC WITH DIFFERENTIAL (CANCER CENTER ONLY)
Abs Immature Granulocytes: 0.02 10*3/uL (ref 0.00–0.07)
Basophils Absolute: 0 10*3/uL (ref 0.0–0.1)
Basophils Relative: 0 %
Eosinophils Absolute: 0.1 10*3/uL (ref 0.0–0.5)
Eosinophils Relative: 2 %
HCT: 38.2 % (ref 36.0–46.0)
Hemoglobin: 12.8 g/dL (ref 12.0–15.0)
Immature Granulocytes: 0 %
Lymphocytes Relative: 29 %
Lymphs Abs: 2.1 10*3/uL (ref 0.7–4.0)
MCH: 28.6 pg (ref 26.0–34.0)
MCHC: 33.5 g/dL (ref 30.0–36.0)
MCV: 85.5 fL (ref 80.0–100.0)
Monocytes Absolute: 0.7 10*3/uL (ref 0.1–1.0)
Monocytes Relative: 10 %
Neutro Abs: 4.2 10*3/uL (ref 1.7–7.7)
Neutrophils Relative %: 59 %
Platelet Count: 376 10*3/uL (ref 150–400)
RBC: 4.47 MIL/uL (ref 3.87–5.11)
RDW: 13 % (ref 11.5–15.5)
WBC Count: 7.1 10*3/uL (ref 4.0–10.5)
nRBC: 0 % (ref 0.0–0.2)

## 2021-08-08 LAB — CMP (CANCER CENTER ONLY)
ALT: 19 U/L (ref 0–44)
AST: 17 U/L (ref 15–41)
Albumin: 4.1 g/dL (ref 3.5–5.0)
Alkaline Phosphatase: 63 U/L (ref 38–126)
Anion gap: 8 (ref 5–15)
BUN: 10 mg/dL (ref 6–20)
CO2: 28 mmol/L (ref 22–32)
Calcium: 10 mg/dL (ref 8.9–10.3)
Chloride: 104 mmol/L (ref 98–111)
Creatinine: 0.77 mg/dL (ref 0.44–1.00)
GFR, Estimated: >60 mL/min (ref >60)
Glucose, Bld: 67 mg/dL — ABNORMAL LOW (ref 70–99)
Potassium: 4 mmol/L (ref 3.5–5.1)
Sodium: 140 mmol/L (ref 135–145)
Total Bilirubin: 0.5 mg/dL (ref 0.3–1.2)
Total Protein: 7.2 g/dL (ref 6.5–8.1)

## 2021-08-08 LAB — LACTATE DEHYDROGENASE: LDH: 131 U/L (ref 98–192)

## 2021-08-14 ENCOUNTER — Encounter: Payer: Self-pay | Admitting: Hematology

## 2021-08-14 ENCOUNTER — Telehealth: Payer: Self-pay | Admitting: Orthopedic Surgery

## 2021-08-14 ENCOUNTER — Encounter: Payer: Self-pay | Admitting: Radiology

## 2021-08-14 NOTE — Telephone Encounter (Signed)
Patient called to follow up on status of  ? (1) approval (peer to peer?) - please advise. ? (2) states a form update will be coming from her short term disability insurer [employer] if it has not already reached [place]. Please advise regarding her continued out of work status per last work note 07/28/21 through Monday 08/14/21. ?  ?

## 2021-08-14 NOTE — Progress Notes (Signed)
[facility] ?OFFICE PROGRESS NOTE ?DOS .08/08/2021 ? ?Amber Meiers, MD ?[address] ?[place] [address] ? 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 ? ?CURRENT THERAPY: maintenance Rituxan q8weeks ? ?INTERVAL HISTORY: ? 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  She has remained very stable with maintenance Rituxan for nearly 3 years.  She recently changed her insurance company and the [employer] declined her continued Rituxan. ?We are already in discussions about potentially discontinuing her maintenance Rituxan as she is approaching 3 years of treatment. ?Patient notes no new symptoms since her last clinic visit.  No new headaches, neck pain or stiffness, new focal neurological deficits.  Back pains or back stiffness. ?She notes general other general body aches that she attributes to her work. ?She notes some psychosocial stressors since her husband she feels back into using drugs and this is creating significant stress between them. ?No new infection issues since her last clinic visit. ?Labs done today were discussed in detail with the patient. ? ?MEDICAL HISTORY: ?Past Medical History:  ?Diagnosis Date  ? Anemia   ? Arthritis   ? Blood dyscrasia   ? sickle cell trait  ? Cancer [facility])   ? lymphoma  ? Carbuncle and furuncle   ? Hypercholesterolemia   ? Hypertension   ? does not take meds  ? Papanicolaou smear of cervix with positive high risk human papilloma virus (HPV) test 08/23/2020  ? 08/23/20    [place] per ASCCP guidelines, immediate risk of CIN 3+ is 4.1 %  ? Sickle cell trait ([place])   ? ? ?ALLERGIES:  is allergic to penicillins, penicillins cross reactors, and tape. ? ?MEDICATIONS:  ?Current Outpatient Medications  ?Medication Sig Dispense Refill  ? atorvastatin (LIPITOR)  20 MG tablet Take 1 tablet (20 mg total) by mouth daily. 90 tablet 2  ? erythromycin ophthalmic ointment Place a 1/2 inch ribbon of ointment into the lower eyelid.  4 times a day for next 7 days. 3.5 g 0  ? ibuprofen (ADVIL) 800 MG tablet Take 1 tablet (800 mg total) by mouth every 8 (eight) hours as needed. 30 tablet 0  ? lisinopril-hydrochlorothiazide (PRINZIDE,ZESTORETIC) 20-12.5 MG tablet Take 1 tablet by mouth daily.    ? meclizine (ANTIVERT) 12.5 MG tablet Take 1 tablet (12.5 mg total) by mouth 3 (three) times daily as needed for dizziness. 8 tablet 0  ? Multiple Vitamin (MULTIVITAMIN WITH MINERALS) TABS tablet Take 1 tablet by mouth daily.    ? naproxen (NAPROSYN) 500 MG tablet Take 1 tablet (500 mg total) by mouth 2 (two) times daily with a meal. 60 tablet 5  ? oxyCODONE (OXY IR/ROXICODONE) 5 MG immediate release tablet Take 5 mg by mouth every 6 (six) hours as needed for pain.    ? potassium chloride SA (KLOR-CON) 20 MEQ tablet TAKE (1) TABLET BY MOUTH TWICE DAILY. 60 tablet 1  ? predniSONE (DELTASONE) 20 MG tablet Take 2 tablets (40 mg total) by mouth daily with breakfast. For the next four days 8 tablet 0  ? ?No current facility-administered medications for this visit.  ? ?Facility-Administered Medications Ordered in Other Visits  ?Medication Dose Route Frequency Provider Last Rate Last Admin  ? acetaminophen (TYLENOL) tablet 500 mg  500 mg Oral Once Brunetta Genera, MD      ? And  ? oxyCODONE (  Oxy IR/ROXICODONE) immediate release tablet 5 mg  5 mg Oral Once Brunetta Genera, MD      ? dexamethasone (DECADRON) 10 mg in sodium chloride 0.9 % 50 mL IVPB  10 mg Intravenous Once Brunetta Genera, MD      ? ? ?SURGICAL HISTORY:  ?Past Surgical History:  ?Procedure Laterality Date  ? APPENDECTOMY    ? APPLICATION OF CRANIAL NAVIGATION N/A 07/28/2018  ? Procedure: APPLICATION OF CRANIAL NAVIGATION;  Surgeon: Kary Kos, MD;  Location: [facility];  Service: Neurosurgery;  Laterality: N/A;  ? BRAIN SURGERY     ? CARPAL TUNNEL RELEASE  03/23/2011  ? Procedure: CARPAL TUNNEL RELEASE;  Surgeon: Sanjuana Kava;  Location: [facility];  Service: Orthopedics;  Laterality: Left;  ? CARPAL TUNNEL RELEASE  05/10/2011  ? Procedure: CARPAL TUNNEL RELEASE;  Surgeon: Sanjuana Kava, MD;  Location: [facility];  Service: Orthopedics;  Laterality: Right;  ? CESAREAN SECTION    ? x 2  ? COLONOSCOPY WITH PROPOFOL N/A 11/16/2020  ? Procedure: COLONOSCOPY WITH PROPOFOL;  Surgeon: Harvel Quale, MD;  [facility];  Service: Gastroenterology;  Laterality: N/A;  9:15  ? INCISION AND DRAINAGE PERIRECTAL ABSCESS  12/21/09  ? LUMBAR LAMINECTOMY/DECOMPRESSION MICRODISCECTOMY Left 12/12/2020  ? Procedure: Laminectomy and Foraminotomy - left - L5-S1;  Surgeon: Kary Kos, MD;  Location: [place];  Service: Neurosurgery;  Laterality: Left;  3C  ? PR DURAL GRAFT REPAIR,SPINE DEFECT N/A 07/28/2018  ? Procedure: Stereotactic open biopsy of Right cerebellar hemisphere and dura with brainlab;  Surgeon: Kary Kos, MD;  Location: [place];  Service: Neurosurgery;  Laterality: N/A;  Stereotactic open biopsy of Right cerebellar hemisphere and dura with brainlab  ? PROCTOSCOPY  10/17/2010  ? Procedure: PROCTOSCOPY;  Surgeon: Scherry Ran;  Location: [facility];  Service: General;  Laterality: N/A;  Rigid Proctoscopy/Possible Fistula in Ano ? ?Procedure ended at 1003  ? THERAPEUTIC ABORTION    ? x2  ? ? ?REVIEW OF SYSTEMS:   ?10 Point review of Systems was done is negative except as noted above. ? ? ?PHYSICAL EXAMINATION:  ?Blood pressure (!) 100/59, pulse 87, temperature 97.9 ?F (36.6 ?C), resp. rate 20, weight 164 lb 12.8 oz (74.8 kg), last menstrual period 08/12/2010, SpO2 99 %. ? ? ?LABORATORY DATA: ? ? ?  Latest Ref Rng & Units 08/08/2021  ?  9:04 AM 06/19/2021  ?  8:50 PM 06/06/2021  ?  9:19 AM  ?CBC  ?WBC 4.0 - 10.5 K/uL 7.1   7.5   11.6    ?Hemoglobin 12.0 - 15.0 g/dL 12.8   11.5   11.5    ?Hematocrit 36.0 - 46.0 % 38.2   34.8   34.1    ?Platelets 150 -  400 K/uL 376   379   380    ? ? ?. ? ?  Latest Ref Rng & Units 08/08/2021  ?  9:04 AM 06/19/2021  ?  8:50 PM 06/06/2021  ?  9:19 AM  ?CMP  ?Glucose 70 - 99 mg/dL 67   152   129    ?BUN 6 - 20 mg/dL '10   14   17    '$ ?Creatinine 0.44 - 1.00 mg/dL 0.77   0.79   0.74    ?Sodium 135 - 145 mmol/L 140   138   137    ?Potassium 3.5 - 5.1 mmol/L 4.0   3.3   3.6    ?Chloride 98 - 111 mmol/L 104  105   104    ?CO2 22 - 32 mmol/L '28   24   26    '$ ?Calcium 8.9 - 10.3 mg/dL 10.0   9.2   10.1    ?Total Protein 6.5 - 8.1 g/dL 7.2   6.7   7.1    ?Total Bilirubin 0.3 - 1.2 mg/dL 0.5   0.4   0.2    ?Alkaline Phos 38 - 126 U/L 63   87   86    ?AST 15 - 41 U/L '17   30   26    '$ ?ALT 0 - 44 U/L 19   44   42    ? ? ?. ?Lab Results  ?Component Value Date  ? LDH 131 08/08/2021  ? ? ?RADIOGRAPHIC STUDIES: ? ?DG Chest 2 View ? ?Result Date: 07/26/2021 ?CLINICAL DATA:  smoker, cough, congestion EXAM: CHEST - 2 VIEW COMPARISON:  None. FINDINGS: The heart size and mediastinal contours are within normal limits. Both lungs are clear. The visualized skeletal structures are unremarkable. IMPRESSION: No active cardiopulmonary disease. Electronically Signed   By: Fidela Salisbury M.D.   On: 07/26/2021 18:28   ? ? ?ASSESSMENT/PLAN:  ? ?61 y.o. female with ?  ?1. Meningeal Extranodal Marginal Zone Lymphoma involving meninges in posterior fossa ?Labs upon initial presentation from 07/28/18, HGB stable at 11.1, WBC higher at 14.2k, PLT normal and stable at 374k ?11/18/17 HIV Antibody non-reactive ?  ?07/28/18 Right cerebellar hemisphere and dura biopsy which revealed Extranodal Marginal Zone Lymphoma ?  ?07/21/18 MRI Brain revealed "Extensive dural thickening surrounding the right cerebellar hemisphere with mass-effect and edema in the right cerebellum. The process appears to extend into the upper cervical canal. Mild obstructive hydrocephalus. Differential includes tumor including metastatic disease and lymphoma. Chronic inflammatory process such as sarcoid is a  consideration however chest x-ray negative. TB and atypical infection/fungus also possible. 6 mm colloid cyst felt to be a separate problem." ?  ?07/25/18 CT C/A/P which did not reveal significant abnormali

## 2021-08-16 ENCOUNTER — Telehealth: Payer: Self-pay | Admitting: Orthopedic Surgery

## 2021-08-16 NOTE — Telephone Encounter (Signed)
Patient called back to ask if something may be prescribed for pain, while she is awaiting surgery. States her pain management doctor said it would be okay for her to take something if Dr Amedeo Kinsman was to prescribe. States uses [employer]. ?

## 2021-08-17 ENCOUNTER — Ambulatory Visit: Payer: Self-pay | Admitting: Orthopedic Surgery

## 2021-08-17 DIAGNOSIS — M75111 Incomplete rotator cuff tear or rupture of right shoulder, not specified as traumatic: Secondary | ICD-10-CM

## 2021-08-17 DIAGNOSIS — Z01818 Encounter for other preprocedural examination: Secondary | ICD-10-CM

## 2021-08-17 NOTE — Telephone Encounter (Signed)
We've received authorization for procedure. Initially gave pt 08/24/21 for surgery date. Please advise.

## 2021-08-18 ENCOUNTER — Ambulatory Visit (INDEPENDENT_AMBULATORY_CARE_PROVIDER_SITE_OTHER): Payer: BC Managed Care – PPO | Admitting: Adult Health

## 2021-08-18 ENCOUNTER — Encounter: Payer: Self-pay | Admitting: Adult Health

## 2021-08-18 ENCOUNTER — Other Ambulatory Visit (HOSPITAL_COMMUNITY)
Admission: RE | Admit: 2021-08-18 | Discharge: 2021-08-18 | Disposition: A | Discharge status: Home / Self Care | Payer: BC Managed Care – PPO | Source: Ambulatory Visit | Attending: Adult Health | Admitting: Adult Health

## 2021-08-18 VITALS — BP 118/66 | HR 65 | Ht 65.0 in | Wt 168.0 lb

## 2021-08-18 DIAGNOSIS — Z01419 Encounter for gynecological examination (general) (routine) without abnormal findings: Secondary | ICD-10-CM | POA: Insufficient documentation

## 2021-08-18 DIAGNOSIS — R87810 Cervical high risk human papillomavirus (HPV) DNA test positive: Secondary | ICD-10-CM

## 2021-08-18 DIAGNOSIS — L0293 Carbuncle, unspecified: Secondary | ICD-10-CM | POA: Diagnosis not present

## 2021-08-18 DIAGNOSIS — Z1211 Encounter for screening for malignant neoplasm of colon: Secondary | ICD-10-CM | POA: Diagnosis not present

## 2021-08-18 DIAGNOSIS — F17200 Nicotine dependence, unspecified, uncomplicated: Secondary | ICD-10-CM

## 2021-08-18 DIAGNOSIS — L732 Hidradenitis suppurativa: Secondary | ICD-10-CM | POA: Diagnosis not present

## 2021-08-18 LAB — HEMOCCULT GUIAC POC 1CARD (OFFICE): Fecal Occult Blood, POC: NEGATIVE

## 2021-08-18 MED ORDER — SULFAMETHOXAZOLE-TRIMETHOPRIM 800-160 MG PO TABS: 1.0000 | ORAL_TABLET | Freq: Two times a day (BID) | ORAL | 0 refills | Status: DC

## 2021-08-18 MED ORDER — SILVER SULFADIAZINE 1 % EX CREA: 1.0000 "application " | TOPICAL_CREAM | Freq: Every day | CUTANEOUS | 0 refills | Status: DC

## 2021-08-18 NOTE — Progress Notes (Signed)
Patient ID: Amber Drake, female   DOB: 12/01/1960, 61 y.o.   MRN: [id number] History of Present Illness: Amber Drake is a 61 year old black female,married, PM in for well woman gyn exam and pap. She had colposcopy in August 2022. She says she has a boil, took antibiotic that did not help. Having right rotator cuff surgery next Thursday.  Lab Results  Component Value Date   DIAGPAP  08/16/2020    - Negative for intraepithelial lesion or malignancy (NILM)   HPVHIGH Positive (A) 08/16/2020   PCP is Dr Legrand Rams   Current Medications, Allergies, Past Medical History, Past Surgical History, Family History and Social History were reviewed in [facility] record.     Review of Systems: Patient denies any headaches, hearing loss, fatigue, blurred vision, shortness of breath, chest pain, abdominal pain, problems with bowel movements, urination, or intercourse(not active). No joint pain or mood swings.  See HPI for positives.   Physical Exam:BP 118/66 (BP Location: Left Arm, Patient Position: Sitting, Cuff Size: Normal)   Pulse 65   Ht '5\' 5"'$  (1.651 m)   Wt 168 lb (76.2 kg)   LMP 08/12/2010   BMI 27.96 kg/m   General:  Well developed, well nourished, no acute distress Skin:  Warm and dry Neck:  Midline trachea, normal thyroid, good ROM, no lymphadenopathy Lungs; Clear to auscultation bilaterally Breast:  No dominant palpable mass, retraction, or nipple discharge Cardiovascular: Regular rate and rhythm Abdomen:  Soft, non tender, no hepatosplenomegaly Pelvic:  External genitalia is normal in appearance, has 1 cm boil left labia and and 2-3 cm boil right buttock, has scarring from HS .  The vagina is normal in appearance. Urethra has no lesions or masses. The cervix is bulbous and smooth, pap with HR HPV genotyping performed.  Uterus is felt to be normal size, shape, and contour.  No adnexal masses or tenderness noted.Bladder is non tender, no masses felt. Rectal: Good  sphincter tone, no polyps, or hemorrhoids felt.  Hemoccult negative. Extremities/musculoskeletal:  No swelling or varicosities noted, no clubbing or cyanosis Psych:  No mood changes, alert and cooperative,seems happy AA is 0 Fall risk is low    08/18/2021   10:19 AM 08/16/2020   10:29 AM 01/20/2019    3:53 PM  Depression screen PHQ 2/9  Decreased Interest 0 0 0  Down, Depressed, Hopeless 0 0 0  PHQ - 2 Score 0 0 0  Altered sleeping 0 0   Tired, decreased energy 0 0   Change in appetite 0 0   Feeling bad or failure about yourself  0 0   Trouble concentrating 0 0   Moving slowly or fidgety/restless 0 0   Suicidal thoughts 0 0   PHQ-9 Score 0 0        08/18/2021   10:19 AM 08/16/2020   10:29 AM 03/02/2015   11:46 AM  GAD 7 : Generalized Anxiety Score  Nervous, Anxious, on Edge 0 0   Control/stop worrying 0 0 3  Worry too much - different things 2 0 3  Trouble relaxing 0 0 2  Restless 0 0 2  Easily annoyed or irritable 1 0 3  Afraid - awful might happen 0 0 3  Total GAD 7 Score 3 0   Anxiety Difficulty  Not difficult at all Somewhat difficult      Upstream - 08/18/21 1019       Pregnancy Intention Screening   Does the patient want to become pregnant  in the next year? No    Does the patient's partner want to become pregnant in the next year? No    Would the patient like to discuss contraceptive options today? No      Contraception Wrap Up   Current Method No Method - Other Reason   postmenopausal   End Method No Method - Other Reason    Contraception Counseling Provided No            Examination chaperoned by Marcelino Scot RN   Impression and Plan: 1. Encounter for gynecological examination with Papanicolaou smear of cervix Pap sent Physical in 1 year Pap in 3 if normal Labs with PCP Mammogram yearly   2. Papanicolaou smear of cervix with positive high risk human papilloma virus (HPV) test Pap sent  3. Hidradenitis suppurativa  4. Recurrent boils Will rx  septra ds and silvadene Meds ordered this encounter  Medications   sulfamethoxazole-trimethoprim (BACTRIM DS) 800-160 MG tablet    Sig: Take 1 tablet by mouth 2 (two) times daily. Take 1 bid    Dispense:  14 tablet    Refill:  0    Order Specific Question:   Supervising Provider    Answer:   Tania Ade H [[id number]]   silver sulfADIAZINE (SILVADENE) 1 % cream    Sig: Apply 1 application. topically daily.    Dispense:  50 g    Refill:  0    Order Specific Question:   Supervising Provider    Answer:   EURE, LUTHER H [[id number]]     5. Encounter for screening fecal occult blood testing Hemoccult is negative  6. Smoker Encouraged to stop smoking

## 2021-08-21 NOTE — Patient Instructions (Addendum)
Your procedure is scheduled on: 08/24/2021  Report to [place] [facility] at  6:00   AM.  Call this number if you have problems the morning of surgery: [phone]   Remember:   Do not Eat or Drink after midnight except drink 1 bottle of Clear carb loading drink at 3:30 am        No Smoking the morning of surgery  :  Take these medicines the morning of surgery with A SIP OF WATER: Oxycodone and/or antivert if needed   Do not wear jewelry, make-up or nail polish.  Do not wear lotions, powders, or perfumes. You may wear deodorant.  Do not shave 48 hours prior to surgery. Men may shave face and neck.  Do not bring valuables to the hospital.  Contacts, dentures or bridgework may not be worn into surgery.  Leave suitcase in the car. After surgery it may be brought to your room.  For patients admitted to the hospital, checkout time is 11:00 AM the day of discharge.   Patients discharged the day of surgery will not be allowed to drive home.    Special Instructions: Shower using CHG night before surgery and shower the day of surgery use CHG.  Use special wash - you have one bottle of CHG for all showers.  You should use approximately 1/2 of the bottle for each shower.  How to Use Chlorhexidine for Bathing Chlorhexidine gluconate (CHG) is a germ-killing (antiseptic) solution that is used to clean the skin. It can get rid of the bacteria that normally live on the skin and can keep them away for about 24 hours. To clean your skin with CHG, you may be given: A CHG solution to use in the shower or as part of a sponge bath. A prepackaged cloth that contains CHG. Cleaning your skin with CHG may help lower the risk for infection: While you are staying in the intensive care unit of the hospital. If you have a vascular access, such as a central line, to provide short-term or long-term access to your veins. If you have a catheter to drain urine from your bladder. If you are on a ventilator. A  ventilator is a machine that helps you breathe by moving air in and out of your lungs. After surgery. What are the risks? Risks of using CHG include: A skin reaction. Hearing loss, if CHG gets in your ears and you have a perforated eardrum. Eye injury, if CHG gets in your eyes and is not rinsed out. The CHG product catching fire. Make sure that you avoid smoking and flames after applying CHG to your skin. Do not use CHG: If you have a chlorhexidine allergy or have previously reacted to chlorhexidine. On babies younger than 77 months of age. How to use CHG solution Use CHG only as told by your health care provider, and follow the instructions on the label. Use the full amount of CHG as directed. Usually, this is one bottle. During a shower Follow these steps when using CHG solution during a shower (unless your health care provider gives you different instructions): Start the shower. Use your normal soap and shampoo to wash your face and hair. Turn off the shower or move out of the shower stream. Pour the CHG onto a clean washcloth. Do not use any type of brush or rough-edged sponge. Starting at your neck, lather your body down to your toes. Make sure you follow these instructions: If you will be having surgery, pay  special attention to the part of your body where you will be having surgery. Scrub this area for at least 1 minute. Do not use CHG on your head or face. If the solution gets into your ears or eyes, rinse them well with water. Avoid your genital area. Avoid any areas of skin that have broken skin, cuts, or scrapes. Scrub your back and under your arms. Make sure to wash skin folds. Let the lather sit on your skin for 1-2 minutes or as long as told by your health care provider. Thoroughly rinse your entire body in the shower. Make sure that all body creases and crevices are rinsed well. Dry off with a clean towel. Do not put any substances on your body afterward--such as powder,  lotion, or perfume--unless you are told to do so by your health care provider. Only use lotions that are recommended by the manufacturer. Put on clean clothes or pajamas. If it is the night before your surgery, sleep in clean sheets.  During a sponge bath Follow these steps when using CHG solution during a sponge bath (unless your health care provider gives you different instructions): Use your normal soap and shampoo to wash your face and hair. Pour the CHG onto a clean washcloth. Starting at your neck, lather your body down to your toes. Make sure you follow these instructions: If you will be having surgery, pay special attention to the part of your body where you will be having surgery. Scrub this area for at least 1 minute. Do not use CHG on your head or face. If the solution gets into your ears or eyes, rinse them well with water. Avoid your genital area. Avoid any areas of skin that have broken skin, cuts, or scrapes. Scrub your back and under your arms. Make sure to wash skin folds. Let the lather sit on your skin for 1-2 minutes or as long as told by your health care provider. Using a different clean, wet washcloth, thoroughly rinse your entire body. Make sure that all body creases and crevices are rinsed well. Dry off with a clean towel. Do not put any substances on your body afterward--such as powder, lotion, or perfume--unless you are told to do so by your health care provider. Only use lotions that are recommended by the manufacturer. Put on clean clothes or pajamas. If it is the night before your surgery, sleep in clean sheets. How to use CHG prepackaged cloths Only use CHG cloths as told by your health care provider, and follow the instructions on the label. Use the CHG cloth on clean, dry skin. Do not use the CHG cloth on your head or face unless your health care provider tells you to. When washing with the CHG cloth: Avoid your genital area. Avoid any areas of skin that have  broken skin, cuts, or scrapes. Before surgery Follow these steps when using a CHG cloth to clean before surgery (unless your health care provider gives you different instructions): Using the CHG cloth, vigorously scrub the part of your body where you will be having surgery. Scrub using a back-and-forth motion for 3 minutes. The area on your body should be completely wet with CHG when you are done scrubbing. Do not rinse. Discard the cloth and let the area air-dry. Do not put any substances on the area afterward, such as powder, lotion, or perfume. Put on clean clothes or pajamas. If it is the night before your surgery, sleep in clean sheets.  For general bathing  Follow these steps when using CHG cloths for general bathing (unless your health care provider gives you different instructions). Use a separate CHG cloth for each area of your body. Make sure you wash between any folds of skin and between your fingers and toes. Wash your body in the following order, switching to a new cloth after each step: The front of your neck, shoulders, and chest. Both of your arms, under your arms, and your hands. Your stomach and groin area, avoiding the genitals. Your right leg and foot. Your left leg and foot. The back of your neck, your back, and your buttocks. Do not rinse. Discard the cloth and let the area air-dry. Do not put any substances on your body afterward--such as powder, lotion, or perfume--unless you are told to do so by your health care provider. Only use lotions that are recommended by the manufacturer. Put on clean clothes or pajamas. Contact a health care provider if: Your skin gets irritated after scrubbing. You have questions about using your solution or cloth. You swallow any chlorhexidine. Call your local poison control center (1-934-430-5679 in the [place] Get help right away if: Your eyes itch badly, or they become very red or swollen. Your skin itches badly and is red or  swollen. Your hearing changes. You have trouble seeing. You have swelling or tingling in your mouth or throat. You have trouble breathing. These symptoms may represent a serious problem that is an emergency. Do not wait to see if the symptoms will go away. Get medical help right away. Call your local emergency services (911 in the [place] Do not drive yourself to the hospital. Summary Chlorhexidine gluconate (CHG) is a germ-killing (antiseptic) solution that is used to clean the skin. Cleaning your skin with CHG may help to lower your risk for infection. You may be given CHG to use for bathing. It may be in a bottle or in a prepackaged cloth to use on your skin. Carefully follow your health care provider's instructions and the instructions on the product label. Do not use CHG if you have a chlorhexidine allergy. Contact your health care provider if your skin gets irritated after scrubbing. This information is not intended to replace advice given to you by your health care provider. Make sure you discuss any questions you have with your health care provider. Document Revised: 05/30/2020 Document Reviewed: 05/30/2020 Elsevier Patient Education  [place]. Surgery for Rotator Cuff Tear, Care After This sheet gives you information about how to care for yourself after your procedure. Your health care provider may also give you more specific instructions. If you have problems or questions, contact your health care provider. What can I expect after the procedure? After the procedure, it is common to have: Redness. Swelling. A small amount of fluid or blood in the incision area. Pain. Stiffness. Follow these instructions at home: If you have a sling or a shoulder immobilizer: Wear it as told by your health care provider. Remove it only as told by your health care provider. Loosen it if your fingers tingle, become numb, or turn cold and blue. Keep it clean and dry. Bathing Do not take  baths, swim, or use a hot tub until your health care provider approves. Ask your health care provider if you may take showers. You may only be allowed to take sponge baths. Keep your bandage (dressing) dry until your health care provider says it can be removed. If your sling or shoulder immobilizer is not waterproof: Do  not let it get wet. Remove it when you take a bath or shower as told by your health care provider. Once the sling or shoulder immobilizer is removed, try not to move your shoulder until your health care provider says that you can. Incision care  Follow instructions from your health care provider about how to take care of your incision. Make sure you: Wash your hands with soap and water for at least 20 seconds before and after you change your dressing. If soap and water are not available, use hand sanitizer. Change your dressing as told by your health care provider. Leave stitches (sutures), skin glue, or adhesive strips in place. These skin closures may need to stay in place for 2 weeks or longer. If adhesive strip edges start to loosen and curl up, you may trim the loose edges. Do not remove adhesive strips completely unless your health care provider tells you to do that. Check your incision area every day for signs of infection. Check for: More redness, swelling, or pain. More fluid or blood. Warmth. Pus or a bad smell. Managing pain, stiffness, and swelling  If directed, put ice on your shoulder area. To do this: Put ice in a plastic bag. Place a towel between your skin and the bag. Leave the ice on for 20 minutes, 2-3 times a day. Remove the ice if your skin turns bright red. This is very important. If you cannot feel pain, heat, or cold, you have a greater risk of damage to the area. Move your fingers often to reduce stiffness and swelling. Raise (elevate) your upper body on pillows when you lie down and when you sleep. Do not sleep on the front of your body  (abdomen). Do not sleep on the side that your surgery was performed on. Medicines Take over-the-counter and prescription medicines only as told by your health care provider. Ask your health care provider if the medicine prescribed to you: Requires you to avoid driving or using machinery. Can cause constipation. You may need to take these actions to prevent or treat constipation: Drink enough fluid to keep your urine pale yellow. Take over-the-counter or prescription medicines. Eat foods that are high in fiber, such as beans, whole grains, and fresh fruits and vegetables. Limit foods that are high in fat and processed sugars, such as fried or sweet foods. Driving If you were given a sedative during the procedure, it can affect you for several hours. Do not drive or operate machinery until your health care provider says that it is safe. Do not drive while wearing a sling or a shoulder immobilizer. Ask your health care provider when it is safe to drive. Activity Do not use your arm to support your body weight until your health care provider says that you can. Do not lift or hold anything with your arm until your health care provider approves. Return to your normal activities as told by your health care provider. Ask your health care provider what activities are safe for you. Do exercises as told by your health care provider. General instructions Do not use any products that contain nicotine or tobacco, such as cigarettes, e-cigarettes, and chewing tobacco. These can delay healing after surgery. If you need help quitting, ask your health care provider. Keep all follow-up visits. This is important. Contact a health care provider if: You have a fever or chills. You have more redness, swelling, or pain around your incision. You have more fluid or blood coming from your incision. Your  incision feels warm to the touch. You have pus or a bad smell coming from your incision. You have pain that gets  worse or does not get better with medicine. Get help right away if: You have severe pain. You lose feeling in your arm or hand. Your hand or fingers turn very pale or blue. Summary If you have a sling or shoulder immobilizer, wear it as told by your health care provider. Remove it only as told by your health care provider. Change your dressing as told by your health care provider. Check the incision area every day for signs of infection. If directed, put ice on your shoulder area 2-3 times a day. Do not use your arm to lift anything or to support your body weight until your health care provider says that you can. This information is not intended to replace advice given to you by your health care provider. Make sure you discuss any questions you have with your health care provider. Document Revised: 07/22/2019 Document Reviewed: 07/22/2019 Elsevier Patient Education  [place] Anesthesia, Adult, Care After This sheet gives you information about how to care for yourself after your procedure. Your health care provider may also give you more specific instructions. If you have problems or questions, contact your health care provider. What can I expect after the procedure? After the procedure, the following side effects are common: Pain or discomfort at the IV site. Nausea. Vomiting. Sore throat. Trouble concentrating. Feeling cold or chills. Feeling weak or tired. Sleepiness and fatigue. Soreness and body aches. These side effects can affect parts of the body that were not involved in surgery. Follow these instructions at home: For the time period you were told by your health care provider:  Rest. Do not participate in activities where you could fall or become injured. Do not drive or use machinery. Do not drink alcohol. Do not take sleeping pills or medicines that cause drowsiness. Do not make important decisions or sign legal documents. Do not take care of children  on your own. Eating and drinking Follow any instructions from your health care provider about eating or drinking restrictions. When you feel hungry, start by eating small amounts of foods that are soft and easy to digest (bland), such as toast. Gradually return to your regular diet. Drink enough fluid to keep your urine pale yellow. If you vomit, rehydrate by drinking water, juice, or clear broth. General instructions If you have sleep apnea, surgery and certain medicines can increase your risk for breathing problems. Follow instructions from your health care provider about wearing your sleep device: Anytime you are sleeping, including during daytime naps. While taking prescription pain medicines, sleeping medicines, or medicines that make you drowsy. Have a responsible adult stay with you for the time you are told. It is important to have someone help care for you until you are awake and alert. Return to your normal activities as told by your health care provider. Ask your health care provider what activities are safe for you. Take over-the-counter and prescription medicines only as told by your health care provider. If you smoke, do not smoke without supervision. Keep all follow-up visits as told by your health care provider. This is important. Contact a health care provider if: You have nausea or vomiting that does not get better with medicine. You cannot eat or drink without vomiting. You have pain that does not get better with medicine. You are unable to pass urine. You develop a skin rash.  You have a fever. You have redness around your IV site that gets worse. Get help right away if: You have difficulty breathing. You have chest pain. You have blood in your urine or stool, or you vomit blood. Summary After the procedure, it is common to have a sore throat or nausea. It is also common to feel tired. Have a responsible adult stay with you for the time you are told. It is important to  have someone help care for you until you are awake and alert. When you feel hungry, start by eating small amounts of foods that are soft and easy to digest (bland), such as toast. Gradually return to your regular diet. Drink enough fluid to keep your urine pale yellow. Return to your normal activities as told by your health care provider. Ask your health care provider what activities are safe for you. This information is not intended to replace advice given to you by your health care provider. Make sure you discuss any questions you have with your health care provider. Document Revised: 12/03/2019 Document Reviewed: 07/02/2019 Elsevier Patient Education  [place].

## 2021-08-22 ENCOUNTER — Encounter (HOSPITAL_COMMUNITY): Payer: Self-pay

## 2021-08-22 ENCOUNTER — Encounter (HOSPITAL_COMMUNITY)
Admission: RE | Admit: 2021-08-22 | Discharge: 2021-08-22 | Disposition: A | Discharge status: Home / Self Care | Payer: BC Managed Care – PPO | Source: Ambulatory Visit | Attending: Orthopedic Surgery | Admitting: Orthopedic Surgery

## 2021-08-22 DIAGNOSIS — M75121 Complete rotator cuff tear or rupture of right shoulder, not specified as traumatic: Secondary | ICD-10-CM | POA: Diagnosis not present

## 2021-08-22 DIAGNOSIS — Z01818 Encounter for other preprocedural examination: Secondary | ICD-10-CM

## 2021-08-22 DIAGNOSIS — S46111A Strain of muscle, fascia and tendon of long head of biceps, right arm, initial encounter: Secondary | ICD-10-CM | POA: Diagnosis not present

## 2021-08-22 DIAGNOSIS — Z01812 Encounter for preprocedural laboratory examination: Secondary | ICD-10-CM | POA: Insufficient documentation

## 2021-08-22 DIAGNOSIS — Z79899 Other long term (current) drug therapy: Secondary | ICD-10-CM | POA: Diagnosis not present

## 2021-08-22 DIAGNOSIS — F17200 Nicotine dependence, unspecified, uncomplicated: Secondary | ICD-10-CM | POA: Diagnosis not present

## 2021-08-22 DIAGNOSIS — X58XXXA Exposure to other specified factors, initial encounter: Secondary | ICD-10-CM | POA: Diagnosis not present

## 2021-08-22 DIAGNOSIS — M75111 Incomplete rotator cuff tear or rupture of right shoulder, not specified as traumatic: Secondary | ICD-10-CM | POA: Diagnosis not present

## 2021-08-22 DIAGNOSIS — D649 Anemia, unspecified: Secondary | ICD-10-CM | POA: Diagnosis not present

## 2021-08-22 DIAGNOSIS — I10 Essential (primary) hypertension: Secondary | ICD-10-CM | POA: Diagnosis not present

## 2021-08-22 LAB — CYTOLOGY - PAP
Comment: NEGATIVE
Comment: NEGATIVE
Comment: NEGATIVE
Diagnosis: NEGATIVE
HPV 16: NEGATIVE
HPV 18 / 45: NEGATIVE
High risk HPV: POSITIVE — AB

## 2021-08-22 LAB — CBC
HCT: 37 % (ref 36.0–46.0)
Hemoglobin: 11.7 g/dL — ABNORMAL LOW (ref 12.0–15.0)
MCH: 27.9 pg (ref 26.0–34.0)
MCHC: 31.6 g/dL (ref 30.0–36.0)
MCV: 88.3 fL (ref 80.0–100.0)
Platelets: 439 10*3/uL — ABNORMAL HIGH (ref 150–400)
RBC: 4.19 MIL/uL (ref 3.87–5.11)
RDW: 13 % (ref 11.5–15.5)
WBC: 6.2 10*3/uL (ref 4.0–10.5)
nRBC: 0 % (ref 0.0–0.2)

## 2021-08-22 LAB — BASIC METABOLIC PANEL
Anion gap: 9 (ref 5–15)
BUN: 12 mg/dL (ref 6–20)
CO2: 27 mmol/L (ref 22–32)
Calcium: 10.1 mg/dL (ref 8.9–10.3)
Chloride: 105 mmol/L (ref 98–111)
Creatinine, Ser: 1.03 mg/dL — ABNORMAL HIGH (ref 0.44–1.00)
GFR, Estimated: >60 mL/min (ref >60)
Glucose, Bld: 63 mg/dL — ABNORMAL LOW (ref 70–99)
Potassium: 4.4 mmol/L (ref 3.5–5.1)
Sodium: 141 mmol/L (ref 135–145)

## 2021-08-24 ENCOUNTER — Ambulatory Visit (HOSPITAL_COMMUNITY): Payer: BC Managed Care – PPO | Admitting: Anesthesiology

## 2021-08-24 ENCOUNTER — Encounter (HOSPITAL_COMMUNITY): Payer: Self-pay | Admitting: Orthopedic Surgery

## 2021-08-24 ENCOUNTER — Ambulatory Visit (HOSPITAL_COMMUNITY)
Admission: RE | Admit: 2021-08-24 | Discharge: 2021-08-24 | Disposition: A | Discharge status: Home / Self Care | Payer: BC Managed Care – PPO | Attending: Orthopedic Surgery | Admitting: Orthopedic Surgery

## 2021-08-24 ENCOUNTER — Encounter: Payer: Self-pay | Admitting: Orthopedic Surgery

## 2021-08-24 ENCOUNTER — Other Ambulatory Visit: Payer: Self-pay

## 2021-08-24 ENCOUNTER — Encounter (HOSPITAL_COMMUNITY)
Admission: RE | Disposition: A | Discharge status: Home / Self Care | Payer: Self-pay | Source: Home / Self Care | Attending: Orthopedic Surgery

## 2021-08-24 DIAGNOSIS — Z1211 Encounter for screening for malignant neoplasm of colon: Secondary | ICD-10-CM

## 2021-08-24 DIAGNOSIS — T7849XA Other allergy, initial encounter: Secondary | ICD-10-CM

## 2021-08-24 DIAGNOSIS — K219 Gastro-esophageal reflux disease without esophagitis: Secondary | ICD-10-CM

## 2021-08-24 DIAGNOSIS — X58XXXA Exposure to other specified factors, initial encounter: Secondary | ICD-10-CM | POA: Diagnosis not present

## 2021-08-24 DIAGNOSIS — M75121 Complete rotator cuff tear or rupture of right shoulder, not specified as traumatic: Secondary | ICD-10-CM | POA: Diagnosis not present

## 2021-08-24 DIAGNOSIS — L0293 Carbuncle, unspecified: Secondary | ICD-10-CM

## 2021-08-24 DIAGNOSIS — Z7189 Other specified counseling: Secondary | ICD-10-CM

## 2021-08-24 DIAGNOSIS — D496 Neoplasm of unspecified behavior of brain: Secondary | ICD-10-CM

## 2021-08-24 DIAGNOSIS — E785 Hyperlipidemia, unspecified: Secondary | ICD-10-CM

## 2021-08-24 DIAGNOSIS — K649 Unspecified hemorrhoids: Secondary | ICD-10-CM

## 2021-08-24 DIAGNOSIS — J40 Bronchitis, not specified as acute or chronic: Secondary | ICD-10-CM

## 2021-08-24 DIAGNOSIS — R87810 Cervical high risk human papillomavirus (HPV) DNA test positive: Secondary | ICD-10-CM

## 2021-08-24 DIAGNOSIS — G5603 Carpal tunnel syndrome, bilateral upper limbs: Secondary | ICD-10-CM

## 2021-08-24 DIAGNOSIS — C884 Extranodal marginal zone B-cell lymphoma of mucosa-associated lymphoid tissue [MALT-lymphoma]: Secondary | ICD-10-CM

## 2021-08-24 DIAGNOSIS — M75101 Unspecified rotator cuff tear or rupture of right shoulder, not specified as traumatic: Secondary | ICD-10-CM | POA: Diagnosis not present

## 2021-08-24 DIAGNOSIS — S46111A Strain of muscle, fascia and tendon of long head of biceps, right arm, initial encounter: Secondary | ICD-10-CM | POA: Insufficient documentation

## 2021-08-24 DIAGNOSIS — D649 Anemia, unspecified: Secondary | ICD-10-CM | POA: Diagnosis not present

## 2021-08-24 DIAGNOSIS — I10 Essential (primary) hypertension: Secondary | ICD-10-CM | POA: Insufficient documentation

## 2021-08-24 DIAGNOSIS — Z79899 Other long term (current) drug therapy: Secondary | ICD-10-CM | POA: Insufficient documentation

## 2021-08-24 DIAGNOSIS — M79642 Pain in left hand: Secondary | ICD-10-CM

## 2021-08-24 DIAGNOSIS — R319 Hematuria, unspecified: Secondary | ICD-10-CM

## 2021-08-24 DIAGNOSIS — M75111 Incomplete rotator cuff tear or rupture of right shoulder, not specified as traumatic: Secondary | ICD-10-CM | POA: Insufficient documentation

## 2021-08-24 DIAGNOSIS — C8330 Diffuse large B-cell lymphoma, unspecified site: Secondary | ICD-10-CM

## 2021-08-24 DIAGNOSIS — F17200 Nicotine dependence, unspecified, uncomplicated: Secondary | ICD-10-CM | POA: Insufficient documentation

## 2021-08-24 DIAGNOSIS — G8918 Other acute postprocedural pain: Secondary | ICD-10-CM | POA: Diagnosis not present

## 2021-08-24 DIAGNOSIS — D571 Sickle-cell disease without crisis: Secondary | ICD-10-CM | POA: Diagnosis not present

## 2021-08-24 DIAGNOSIS — M79641 Pain in right hand: Secondary | ICD-10-CM

## 2021-08-24 DIAGNOSIS — L732 Hidradenitis suppurativa: Secondary | ICD-10-CM

## 2021-08-24 DIAGNOSIS — G8929 Other chronic pain: Secondary | ICD-10-CM

## 2021-08-24 DIAGNOSIS — Z01419 Encounter for gynecological examination (general) (routine) without abnormal findings: Secondary | ICD-10-CM

## 2021-08-24 DIAGNOSIS — R768 Other specified abnormal immunological findings in serum: Secondary | ICD-10-CM

## 2021-08-24 DIAGNOSIS — M48061 Spinal stenosis, lumbar region without neurogenic claudication: Secondary | ICD-10-CM

## 2021-08-24 HISTORY — PX: ARTHOSCOPIC ROTAOR CUFF REPAIR: SHX5002

## 2021-08-24 HISTORY — PX: SHOULDER ARTHROSCOPY WITH BICEPSTENOTOMY: SHX6204

## 2021-08-24 LAB — GLUCOSE, CAPILLARY: Glucose-Capillary: 102 mg/dL — ABNORMAL HIGH (ref 70–99)

## 2021-08-24 SURGERY — REPAIR, ROTATOR CUFF, ARTHROSCOPIC
Anesthesia: General | Site: Shoulder | Laterality: Right

## 2021-08-24 MED ORDER — LIDOCAINE HCL (PF) 1 % IJ SOLN
INTRAMUSCULAR | Status: AC
  Filled 2021-08-24: qty 30

## 2021-08-24 MED ORDER — FENTANYL CITRATE (PF) 250 MCG/5ML IJ SOLN
INTRAMUSCULAR | Status: DC | PRN
Start: 2021-08-24 — End: 2021-08-24
  Administered 2021-08-24 (×2): 50 ug via INTRAVENOUS

## 2021-08-24 MED ORDER — ONDANSETRON HCL 4 MG/2ML IJ SOLN: 4.0000 mg | Freq: Once | INTRAMUSCULAR | Status: DC | PRN

## 2021-08-24 MED ORDER — VANCOMYCIN HCL IN DEXTROSE 1-5 GM/200ML-% IV SOLN
1000.0000 mg | Freq: Once | INTRAVENOUS | Status: AC
  Administered 2021-08-24: 1000 mg via INTRAVENOUS
  Filled 2021-08-24: qty 200

## 2021-08-24 MED ORDER — CELECOXIB 100 MG PO CAPS: 100.0000 mg | ORAL_CAPSULE | Freq: Every day | ORAL | 0 refills | Status: AC

## 2021-08-24 MED ORDER — SUGAMMADEX SODIUM 200 MG/2ML IV SOLN
INTRAVENOUS | Status: DC | PRN
  Administered 2021-08-24: 200 mg via INTRAVENOUS

## 2021-08-24 MED ORDER — DEXAMETHASONE SODIUM PHOSPHATE 4 MG/ML IJ SOLN
INTRAMUSCULAR | Status: DC | PRN
Start: 2021-08-24 — End: 2021-08-24
  Administered 2021-08-24: 10 mg via INTRAVENOUS

## 2021-08-24 MED ORDER — ASPIRIN 81 MG PO TBEC: 81.0000 mg | DELAYED_RELEASE_TABLET | Freq: Two times a day (BID) | ORAL | 0 refills | Status: AC

## 2021-08-24 MED ORDER — SODIUM CHLORIDE 0.9 % IR SOLN
Status: DC | PRN
  Administered 2021-08-24: 1000 mL

## 2021-08-24 MED ORDER — MIDAZOLAM HCL 2 MG/2ML IJ SOLN
INTRAMUSCULAR | Status: AC
  Filled 2021-08-24: qty 2

## 2021-08-24 MED ORDER — ORAL CARE MOUTH RINSE: 15.0000 mL | Freq: Once | OROMUCOSAL | Status: AC

## 2021-08-24 MED ORDER — DEXAMETHASONE SODIUM PHOSPHATE 10 MG/ML IJ SOLN
INTRAMUSCULAR | Status: AC
  Filled 2021-08-24: qty 1

## 2021-08-24 MED ORDER — ROCURONIUM BROMIDE 10 MG/ML (PF) SYRINGE
PREFILLED_SYRINGE | INTRAVENOUS | Status: DC | PRN
  Administered 2021-08-24: 10 mg via INTRAVENOUS
  Administered 2021-08-24: 60 mg via INTRAVENOUS
  Administered 2021-08-24: 10 mg via INTRAVENOUS

## 2021-08-24 MED ORDER — LIDOCAINE HCL (PF) 2 % IJ SOLN
INTRAMUSCULAR | Status: AC
  Filled 2021-08-24: qty 5

## 2021-08-24 MED ORDER — EPINEPHRINE PF 1 MG/ML IJ SOLN
INTRAMUSCULAR | Status: AC
  Filled 2021-08-24: qty 4

## 2021-08-24 MED ORDER — PHENYLEPHRINE 80 MCG/ML (10ML) SYRINGE FOR IV PUSH (FOR BLOOD PRESSURE SUPPORT)
PREFILLED_SYRINGE | INTRAVENOUS | Status: AC
  Filled 2021-08-24: qty 10

## 2021-08-24 MED ORDER — ONDANSETRON HCL 4 MG/2ML IJ SOLN
INTRAMUSCULAR | Status: AC
  Filled 2021-08-24: qty 2

## 2021-08-24 MED ORDER — MIDAZOLAM HCL 2 MG/2ML IJ SOLN
INTRAMUSCULAR | Status: DC | PRN
  Administered 2021-08-24 (×2): 1 mg via INTRAVENOUS

## 2021-08-24 MED ORDER — HYDROMORPHONE HCL 1 MG/ML IJ SOLN
0.2500 mg | INTRAMUSCULAR | Status: DC | PRN
  Administered 2021-08-24: 0.5 mg via INTRAVENOUS
  Filled 2021-08-24: qty 0.5

## 2021-08-24 MED ORDER — PROPOFOL 10 MG/ML IV BOLUS
INTRAVENOUS | Status: AC
  Filled 2021-08-24: qty 20

## 2021-08-24 MED ORDER — PROPOFOL 10 MG/ML IV BOLUS
INTRAVENOUS | Status: DC | PRN
  Administered 2021-08-24: 30 mg via INTRAVENOUS
  Administered 2021-08-24: 170 mg via INTRAVENOUS

## 2021-08-24 MED ORDER — DEXAMETHASONE SODIUM PHOSPHATE 4 MG/ML IJ SOLN
INTRAMUSCULAR | Status: DC | PRN
  Administered 2021-08-24: 2 mg via PERINEURAL
  Administered 2021-08-24: 6 mg via PERINEURAL

## 2021-08-24 MED ORDER — CHLORHEXIDINE GLUCONATE 0.12 % MT SOLN
15.0000 mL | Freq: Once | OROMUCOSAL | Status: AC
  Administered 2021-08-24: 15 mL via OROMUCOSAL
  Filled 2021-08-24: qty 15

## 2021-08-24 MED ORDER — FENTANYL CITRATE (PF) 100 MCG/2ML IJ SOLN
INTRAMUSCULAR | Status: AC
  Filled 2021-08-24: qty 2

## 2021-08-24 MED ORDER — PHENYLEPHRINE 80 MCG/ML (10ML) SYRINGE FOR IV PUSH (FOR BLOOD PRESSURE SUPPORT)
PREFILLED_SYRINGE | INTRAVENOUS | Status: DC | PRN
Start: 2021-08-24 — End: 2021-08-24
  Administered 2021-08-24: 160 ug via INTRAVENOUS
  Administered 2021-08-24 (×2): 80 ug via INTRAVENOUS

## 2021-08-24 MED ORDER — PHENYLEPHRINE HCL-NACL 20-0.9 MG/250ML-% IV SOLN
INTRAVENOUS | Status: AC
  Filled 2021-08-24: qty 250

## 2021-08-24 MED ORDER — SODIUM CHLORIDE 0.9 % IR SOLN
Status: DC | PRN
  Administered 2021-08-24 (×12): 3000 mL

## 2021-08-24 MED ORDER — BUPIVACAINE-EPINEPHRINE (PF) 0.5% -1:200000 IJ SOLN
INTRAMUSCULAR | Status: AC
  Filled 2021-08-24: qty 30

## 2021-08-24 MED ORDER — EPINEPHRINE PF 1 MG/ML IJ SOLN
INTRAMUSCULAR | Status: AC
  Filled 2021-08-24: qty 6

## 2021-08-24 MED ORDER — DEXAMETHASONE SODIUM PHOSPHATE 4 MG/ML IJ SOLN
INTRAMUSCULAR | Status: AC
Start: 2021-08-24 — End: ?
  Filled 2021-08-24: qty 2

## 2021-08-24 MED ORDER — ROPIVACAINE HCL 5 MG/ML IJ SOLN
INTRAMUSCULAR | Status: AC
  Filled 2021-08-24: qty 30

## 2021-08-24 MED ORDER — ROPIVACAINE HCL 5 MG/ML IJ SOLN
INTRAMUSCULAR | Status: DC | PRN
  Administered 2021-08-24: 24 mL via PERINEURAL
  Administered 2021-08-24: 4 mL via PERINEURAL

## 2021-08-24 MED ORDER — LIDOCAINE HCL (PF) 1 % IJ SOLN
INTRAMUSCULAR | Status: DC | PRN
  Administered 2021-08-24: 3 mL

## 2021-08-24 MED ORDER — LIDOCAINE 2% (20 MG/ML) 5 ML SYRINGE
INTRAMUSCULAR | Status: DC | PRN
  Administered 2021-08-24: 100 mg via INTRAVENOUS

## 2021-08-24 MED ORDER — PHENYLEPHRINE HCL-NACL 20-0.9 MG/250ML-% IV SOLN
INTRAVENOUS | Status: DC | PRN
Start: 2021-08-24 — End: 2021-08-24
  Administered 2021-08-24: 60 ug/min via INTRAVENOUS

## 2021-08-24 MED ORDER — LACTATED RINGERS IV SOLN
INTRAVENOUS | Status: DC
  Administered 2021-08-24: 1000 mL via INTRAVENOUS

## 2021-08-24 MED ORDER — ONDANSETRON HCL 4 MG PO TABS: 4.0000 mg | ORAL_TABLET | Freq: Three times a day (TID) | ORAL | 0 refills | Status: AC | PRN

## 2021-08-24 MED ORDER — ROCURONIUM BROMIDE 10 MG/ML (PF) SYRINGE
PREFILLED_SYRINGE | INTRAVENOUS | Status: AC
  Filled 2021-08-24: qty 10

## 2021-08-24 MED ORDER — ACETAMINOPHEN 500 MG PO TABS: 1000.0000 mg | ORAL_TABLET | Freq: Three times a day (TID) | ORAL | 0 refills | Status: AC

## 2021-08-24 MED ORDER — ONDANSETRON HCL 4 MG/2ML IJ SOLN
INTRAMUSCULAR | Status: DC | PRN
  Administered 2021-08-24: 4 mg via INTRAVENOUS

## 2021-08-24 SURGICAL SUPPLY — 67 items
ANCH SUT SWLK 19.1X4.75 (Anchor) ×2 IMPLANT
ANCHOR SUT BIO SW 4.75X19.1 (Anchor) ×2 IMPLANT
APL PRP STRL LF DISP 70% ISPRP (MISCELLANEOUS) ×2
BLADE SURG SZ11 CARB STEEL (BLADE) ×4 IMPLANT
BNDG COHESIVE 4X5 TAN ST LF (GAUZE/BANDAGES/DRESSINGS) ×2 IMPLANT
BNDG GAUZE ELAST 4 BULKY (GAUZE/BANDAGES/DRESSINGS) ×8 IMPLANT
CANNULA 8.25X9 (CANNULA) ×2 IMPLANT
CHLORAPREP W/TINT 26 (MISCELLANEOUS) ×4 IMPLANT
CLOTH BEACON ORANGE TIMEOUT ST (SAFETY) ×4 IMPLANT
COOLER ICEMAN CLASSIC (MISCELLANEOUS) ×4 IMPLANT
COVER LIGHT HANDLE STERIS (MISCELLANEOUS) ×8 IMPLANT
COVER MAYO STAND XLG (MISCELLANEOUS) ×4 IMPLANT
CUFF CRYO KNEE LG 20X31 COOLER (ORTHOPEDIC SUPPLIES) ×4 IMPLANT
CUTTER BONE 4.0MM X 13CM (MISCELLANEOUS) ×2 IMPLANT
DRAPE HALF SHEET 40X57 (DRAPES) ×2 IMPLANT
DRAPE INCISE IOBAN 44X35 STRL (DRAPES) ×4 IMPLANT
DRAPE ORTHO 2.5IN SPLIT 77X108 (DRAPES) ×2 IMPLANT
DRAPE ORTHO SPLIT 77X108 STRL (DRAPES) ×12
DRAPE SHOULDER BEACH CHAIR (DRAPES) ×4 IMPLANT
DRAPE SURG ORHT 6 SPLT 77X108 (DRAPES) ×6 IMPLANT
DRAPE U 60X70 (DRAPES) ×6 IMPLANT
ELECT REM PT RETURN 9FT ADLT (ELECTROSURGICAL) ×3
ELECTRODE REM PT RTRN 9FT ADLT (ELECTROSURGICAL) ×3 IMPLANT
GAUZE SPONGE 4X4 12PLY STRL (GAUZE/BANDAGES/DRESSINGS) ×4 IMPLANT
GAUZE XEROFORM 1X8 LF (GAUZE/BANDAGES/DRESSINGS) ×4 IMPLANT
GLOVE BIOGEL PI IND STRL 7.0 (GLOVE) ×7 IMPLANT
GLOVE BIOGEL PI IND STRL 8 (GLOVE) ×2 IMPLANT
GLOVE BIOGEL PI IND STRL 8.5 (GLOVE) ×1 IMPLANT
GLOVE BIOGEL PI INDICATOR 7.0 (GLOVE) ×3
GLOVE BIOGEL PI INDICATOR 8 (GLOVE) ×2
GLOVE BIOGEL PI INDICATOR 8.5 (GLOVE) ×1
GLOVE SKINSENSE NS SZ8.0 LF (GLOVE) ×4
GLOVE SKINSENSE STRL SZ8.0 LF (GLOVE) ×4 IMPLANT
GOWN STRL REUS W/ TWL XL LVL3 (GOWN DISPOSABLE) ×4 IMPLANT
GOWN STRL REUS W/TWL LRG LVL3 (GOWN DISPOSABLE) ×6 IMPLANT
GOWN STRL REUS W/TWL XL LVL3 (GOWN DISPOSABLE) ×8 IMPLANT
IMPL SPEEDBRIDGE KIT (Orthopedic Implant) ×1 IMPLANT
IMPLANT SPEEDBRIDGE KIT (Orthopedic Implant) ×3 IMPLANT
INST SET MINOR BONE (KITS) ×4 IMPLANT
IV NS IRRIG 3000ML ARTHROMATIC (IV SOLUTION) ×32 IMPLANT
KIT POSITION SHOULDER SCHLEI (MISCELLANEOUS) ×4 IMPLANT
KIT STABILIZATION SHOULDER (MISCELLANEOUS) ×4 IMPLANT
KIT TURNOVER KIT A (KITS) ×4 IMPLANT
MANIFOLD NEPTUNE II (INSTRUMENTS) ×4 IMPLANT
NDL HYPO 21X1.5 SAFETY (NEEDLE) ×2 IMPLANT
NDL SPNL 18GX3.5 QUINCKE PK (NEEDLE) ×2 IMPLANT
NEEDLE HYPO 21X1.5 SAFETY (NEEDLE) ×3 IMPLANT
NEEDLE SPNL 18GX3.5 QUINCKE PK (NEEDLE) ×3 IMPLANT
NS IRRIG 1000ML POUR BTL (IV SOLUTION) ×4 IMPLANT
PACK BASIC LIMB (CUSTOM PROCEDURE TRAY) ×4 IMPLANT
PAD ABD 5X9 TENDERSORB (GAUZE/BANDAGES/DRESSINGS) ×8 IMPLANT
PAD ARMBOARD 7.5X6 YLW CONV (MISCELLANEOUS) ×4 IMPLANT
PICK POWER XL 45DEG (MISCELLANEOUS) ×2 IMPLANT
SET ARTHROSCOPY INST (INSTRUMENTS) ×4 IMPLANT
SET BASIN LINEN APH (SET/KITS/TRAYS/PACK) ×4 IMPLANT
SET SHOULDER TRAC (MISCELLANEOUS) ×3 IMPLANT
SET SHOULDER TRACTION (MISCELLANEOUS) ×3
SLING ULTRA II L (ORTHOPEDIC SUPPLIES) ×2 IMPLANT
SPONGE T-LAP 18X18 ~~LOC~~+RFID (SPONGE) ×4 IMPLANT
SUT ETHILON 3 0 FSL (SUTURE) ×4 IMPLANT
SUTURE TAPE FIBERLINK 1.3 LOOP (SUTURE) ×1 IMPLANT
SUTURETAPE FIBERLINK 1.3 LOOP (SUTURE) ×3
SYR 10ML LL (SYRINGE) ×2 IMPLANT
SYR BULB IRRIG 60ML STRL (SYRINGE) ×4 IMPLANT
TOWEL OR 17X26 4PK STRL BLUE (TOWEL DISPOSABLE) ×4 IMPLANT
TUBING IN/OUT FLOW W/MAIN PUMP (TUBING) ×4 IMPLANT
YANKAUER SUCT 12FT TUBE ARGYLE (SUCTIONS) ×4 IMPLANT

## 2021-08-24 NOTE — Op Note (Addendum)
Orthopaedic Surgery Operative Note (CSN: [id number])  Amber Drake  Nov 28, 1960 Date of Surgery: 08/24/2021   Diagnoses:  Right rotator cuff tear Long head of the biceps tear  Procedure: Arthroscopic subacromial decompression Arthroscopic rotator cuff repair Arthroscopic biceps tenotomy   Operative Finding Exam under anesthesia: Full ROM Articular space: No loose bodies, capsule intact, labrum intact Chondral surfaces:Intact, no sign of chondral degeneration on the glenoid or humeral head Biceps: Near complete thickness tear at the insertion of the supraspinatus; dislocation from the groove Subscapularis: Intact Superior Cuff: Small area of full thickness tearing at the anterior insertion of the supraspinatus with partial thickness tearing adjacent and delamination at the footprint Bursal side: Intact, minimal bursitis  Successful completion of the planned procedure.  Double row rotator cuff tear with subacromial decompression and biceps tenotomy     Post-Op Diagnosis: Same Surgeons:Primary: Oliver Barre, MD Assisting: Vickki Hearing, MD Assistants: Fuller Canada, MD - Assistance was needed throughout the case in order to position the patient, prep and drape as well as hold the camera and aid in placement of anchors and passing sutures.  Location: [facility] OR ROOM 4 Anesthesia: General with Exparel interscalene block Antibiotics: Vancomycin 1 g  Tourniquet time: None Estimated Blood Loss: Minimal Complications: None Specimens: None Implants: Implant Name Type Inv. Item Serial No. Manufacturer Lot No. LRB No. Used Action  IMPLANT SPEEDBRIDGE KIT - [id number] Orthopedic Implant IMPLANT SPEEDBRIDGE KIT  [employer] [id number] Right 1 Implanted  ANCHOR SUT BIO SW 4.75X19.1 - [id number] Anchor ANCHOR SUT BIO SW 4.75X19.1  [employer] [id number] Right 1 Implanted    Indications for Surgery:   Amber Drake is a 61 y.o. female with shoulder instability failing non-operative  management and at risk of continued instability.  We discussed options including continued rehab versus surgery.  Family and patient understand the nature of postop recovery.  The risks and benefits were explained at length including but not limited to continued pain, cuff failure, continued instability, pain, hardware malfunction, infection and stiffness were all discussed.   Procedure:   Patient was correctly identified in the preoperative holding area and operative site marked.  Patient brought to OR and positioned supine on a standard OR table with the Schlein attachment in beachchair position.  Anesthesia was induced and the operative shoulder was prepped and draped in the usual sterile fashion.  Timeout was called preincision.  We started by making a posterior viewing portal.  The arthroscope was inserted into the joint.  The above noted findings were determined.  The subscapularis was intact, under good tension.  The biceps was dislocated from the groove.  There was significant fraying at the junction of the supraspinatus attachment, at the anterior shoulder.  This was essentially completely torn.  We used combination of shaver and arthroscopic electrocautery to complete the tear, and the tendon was removed from the joint.  At this point, we had a much better view.  There was fraying of the superior labrum.  This was gently debrided with a shaver and electrocautery.  The glenoid, and the humeral head were without significant cartilage damage.  There was significant fraying on the undersurface of the rotator cuff, and there was some small areas of full-thickness, with high-grade partial-thickness tearing, which was visible from inside the joint.  We then placed the camera within the subacromial space.  We made 2 accessory portals over the lateral shoulder, one at the anterior lateral edge, and the other at the midpoint of the acromion.  There was  a small amount of bursal tissue, which was debrided.   The acromion was then skeletonized.  There were some small subacromial bone spurs, which were gently debrided.  At this point, we had an excellent view.  The arm was externally rotated, and the injury to the rotator cuff was visible, at the anterior footprint of the supraspinatus.  There was a lot of friable tissue around the full-thickness tearing.  We used a shaver to debride some of the friable tissue.  This left [place] with a very manageable crescent shaped tear.  The footprint was then cleared with electrocautery, and a shaver.  A power pick was used at the footprint to introduce some bleeding and promote healing.  We placed 2 Medial Row anchors through percutaneous stab incisions at the articular margin.  We then used the scorpion device to pass the double loaded suture at the anterior aspect of the tendon, as well as the posterior aspect of the tendon.  These wedge was then cut, leaving 2 sutures at the posterior edge of the tear, and 2 sutures at the anterior edge of the tear.  The sutures were then crisscrossed, and we placed a swivel lock met the anterior aspect of the tear, just distal to the footprint.  We achieved excellent compression.  We then repeated the same steps at the posterior edge of the tear, and the proximal humerus, and once again achieved excellent compression.  Final arthroscopic images demonstrated appropriate positioning of the anchors, as well as good compression of the rotator cuff tissue approximated to the footprint.  We took 1 more view inside the shoulder, and noted that there was a small fragment of one of the anchors which had broken off.  We are able to retrieve this from the joint.  No additional pathology was noted within the joint.  The incisions were closed 3-0 nylon.  A sterile dressing was placed along with a sling. The patient was awoken from general anesthesia and taken to the PACU in stable condition without complication.   Post-operative plan:  The patient will be  non-weightbearing in a sling for 4-6 weeks with PT to start after.   The patient will be discharged home.  DVT prophylaxis with 81 mg of aspirin twice daily, until she has returned to her previous level of mobility.   Pain control with PRN pain medication preferring oral medicines.  Patient is receiving narcotic pain medications from her primary care provider.  She received a large prescription earlier this month.  She will contact the clinic if she has issues. Follow up plan will be scheduled in approximately 7-10 days for incision check and XR.

## 2021-08-24 NOTE — Anesthesia Procedure Notes (Signed)
Anesthesia Regional Block: Other (right superficial cervical plexus block)   Pre-Anesthetic Checklist: , timeout performed,  Correct Patient, Correct Site, Correct Laterality,  Correct Procedure, Correct Position, site marked,  Risks and benefits discussed,  Surgical consent,  Pre-op evaluation,  At surgeon's request and post-op pain management  Laterality: Right and Upper  Prep: chloraprep       Needles:  Injection technique: Single-shot  Needle Type: Echogenic Stimulator Needle     Needle Length: 8.3cm  Needle Gauge: 22   Needle insertion depth: 4 cm   Additional Needles:   Procedures:, nerve stimulator,,, ultrasound used (permanent image in chart),,     Nerve Stimulator or Paresthesia:  Response: Twitch elicited, 0.5 mA, 0.3 ms  Additional Responses:   Narrative:  Start time: 08/24/2021 7:22 AM End time: 08/24/2021 7:24 AM Injection made incrementally with aspirations every 5 mL.  Performed by: Personally  Anesthesiologist: Denese Killings, MD  Additional Notes: After injecting local anesthetic at interscalene block, probe moved cephalad to identify facial plane deep to right sternocleidomastoid and ropivacaine 4 ml plus dexamethasone 2 mg were injected at the lateral edge of the muscle.

## 2021-08-24 NOTE — Anesthesia Procedure Notes (Addendum)
Anesthesia Regional Block: Interscalene brachial plexus block   Pre-Anesthetic Checklist: , timeout performed,  Correct Patient, Correct Site, Correct Laterality,  Correct Procedure, Correct Position, site marked,  Risks and benefits discussed,  Surgical consent,  Pre-op evaluation,  At surgeon's request and post-op pain management  Laterality: Upper and Right  Prep: chloraprep       Needles:  Injection technique: Single-shot  Needle Type: Echogenic Stimulator Needle     Needle Length: 8.3cm  Needle Gauge: 22     Additional Needles:   Procedures:, nerve stimulator,,,,,     Nerve Stimulator or Paresthesia:  Response: NoTwitch elicited, 0.5 mA, 0.3 ms, 5 cm  Additional Responses:   Narrative:  Start time: 08/24/2021 7:19 AM End time: 08/24/2021 7:24 AM Injection made incrementally with aspirations every 5 mL.  Performed by: Personally  Anesthesiologist: Denese Killings, MD  Additional Notes: Ropivacaine 0.5% 24 ml plus dexamethasone 6 mg injected at interscalene block and ropivacaine 0.5% 4 ml plus dexamethasone '2mg'$  was injected at superficial cervical plexus block. Block assessed prior to start of surgery

## 2021-08-24 NOTE — Interval H&P Note (Signed)
History and Physical Interval Note:  08/24/2021 7:17 AM  Amber Drake  has presented today for surgery, with the diagnosis of Right rotator cuff tear.  The various methods of treatment have been discussed with the patient and family. After consideration of risks, benefits and other options for treatment, the patient has consented to  Procedure(s) with comments: ARTHROSCOPIC ROTATOR CUFF REPAIR (Right) BICEPS TENODESIS (Right) - Possible open biceps tenodesis as a surgical intervention.  The patient's history has been reviewed, patient examined, no change in status, stable for surgery.  I have reviewed the patient's chart and labs.  Questions were answered to the patient's satisfaction.     Mordecai Rasmussen

## 2021-08-24 NOTE — Anesthesia Postprocedure Evaluation (Signed)
Anesthesia Post Note  Patient: Nhu Glasby  Procedure(s) Performed: ARTHROSCOPIC ROTATOR CUFF REPAIR (Right: Shoulder) SHOULDER ARTHROSCOPY WITH BICEPSTENOTOMY (Right: Shoulder)  Patient location during evaluation: Phase II Anesthesia Type: General Level of consciousness: awake and alert and oriented Pain management: pain level controlled Vital Signs Assessment: post-procedure vital signs reviewed and stable Respiratory status: spontaneous breathing, nonlabored ventilation and respiratory function stable Cardiovascular status: blood pressure returned to baseline and stable Postop Assessment: no apparent nausea or vomiting Anesthetic complications: no   No notable events documented.   Last Vitals:  Vitals:   08/24/21 1115 08/24/21 1150  BP: 117/61 121/78  Pulse: 97 90  Resp: 16 18  Temp:  36.5 C  SpO2: 100% 100%    Last Pain:  Vitals:   08/24/21 1150  TempSrc: Oral  PainSc: 0-No pain                 Telma Pyeatt C Day Deery

## 2021-08-24 NOTE — Transfer of Care (Signed)
Immediate Anesthesia Transfer of Care Note  Patient: Miyuki Rzasa  Procedure(s) Performed: ARTHROSCOPIC ROTATOR CUFF REPAIR (Right: Shoulder) SHOULDER ARTHROSCOPY WITH BICEPSTENOTOMY (Right: Shoulder)  Patient Location: PACU  Anesthesia Type:General  Level of Consciousness: awake, alert  and oriented  Airway & Oxygen Therapy: Patient Spontanous Breathing and Patient connected to face mask oxygen  Post-op Assessment: Report given to RN and Post -op Vital signs reviewed and stable  Post vital signs: Reviewed and stable  Last Vitals:  Vitals Value Taken Time  BP 148/68 08/24/21 1050  Temp    Pulse 102 08/24/21 1050  Resp 19 08/24/21 1050  SpO2 100 % 08/24/21 1050  Vitals shown include unvalidated device data.  Last Pain:  Vitals:   08/24/21 0645  TempSrc: Oral  PainSc: 9       Patients Stated Pain Goal: 8 (81/01/75 1025)  Complications: No notable events documented.

## 2021-08-24 NOTE — Discharge Instructions (Signed)
Amber Drake A. Amedeo Kinsman, MD [facility] [address] [place],  [place]  [address] Phone: [phone] Fax: [phone]    POST-OPERATIVE INSTRUCTIONS - SHOULDER ARTHROSCOPY  WOUND CARE You may remove the Operative Dressing on Post-Op Day #3 (72hrs after surgery).   Alternatively if you would like you can leave dressing on until follow-up if within 7-8 days but keep it dry. Leave steri-strips in place until they fall off on their own, usually 2 weeks postop. There may be a small amount of fluid/bleeding leaking at the surgical site. This is normal; the shoulder is filled with fluid during the procedure and can leak for 24-48hrs after surgery. ou may change/reinforce the bandage as needed.  Use the Cryocuff or Ice as often as possible for the first 7 days, then as needed for pain relief. Always keep a towel, ACE wrap or other barrier between the cooling unit and your skin.  You may shower on Post-Op Day #3. Gently pat the area dry. Do not soak the shoulder in water or submerge it. Keep dry incisions as dry as possible. Do not go swimming in the pool or ocean until 4 weeks after surgery or when otherwise instructed.    EXERCISES/BRACING Sling should be used at all times until follow-up.  You can remove sling for hygiene.    Please continue to ambulate and do not stay sitting or lying for too long. Perform foot and wrist pumps to assist in circulation.  POST-OP MEDICATIONS- Multimodal approach to pain control In general your pain will be controlled with a combination of substances.  Prescriptions unless otherwise discussed are electronically sent to your pharmacy.  This is a carefully made plan we use to minimize narcotic use.    Celebrex - Anti-inflammatory medication taken on a scheduled basis Acetaminophen - Non-narcotic pain medicine taken on a scheduled basis  Oxycodone - This is a strong narcotic, to be used only on an "as needed" basis for pain.  **You  received a prescription for 120 pills on 08/04/21.  You should only be receiving this medication from one provider.   Please call the office if you have issues.  Aspirin '81mg'$  - This medicine is used to minimize the risk of blood clots after surgery. Zofran - take as needed for nausea   FOLLOW-UP If you develop a Fever (?101.5), Redness or Drainage from the surgical incision site, please call our office to arrange for an evaluation. Please call the office to schedule a follow-up appointment for your suture removal, 10-14 days post-operatively.    HELPFUL INFORMATION  If you had a block, it will wear off between 8-24 hrs postop typically.  This is period when your pain may go from nearly zero to the pain you would have had postop without the block.  This is an abrupt transition but nothing dangerous is happening.  You may take an extra dose of narcotic when this happens.  You may be more comfortable sleeping in a semi-seated position the first few nights following surgery.  Keep a pillow propped under the elbow and forearm for comfort.  If you have a recliner type of chair it might be beneficial.  If not that is fine too, but it would be helpful to sleep propped up with pillows behind your operated shoulder as well under your elbow and forearm.  This will reduce pulling on the suture lines.  When dressing, put your operative arm in the sleeve first.  When getting undressed, take  your operative arm out last.  Loose fitting, button-down shirts are recommended.  Often in the first days after surgery you may be more comfortable keeping your operative arm under your shirt and not through the sleeve.  You may return to work/school in the next couple of days when you feel up to it.  Desk work and typing in the sling is     fine.  We suggest you use the pain medication the first night prior to going to bed, in order to ease any pain when the anesthesia wears off. You should avoid taking pain medications on  an empty stomach as it will make you nauseous.  You should wean off your narcotic medicines as soon as you are able.  Most patients will be off or using minimal narcotics before their first postop appointment.   Do not drink alcoholic beverages or take illicit drugs when taking pain medications.  It is against the law to drive while taking narcotics.  In some states it is against the law to drive while your arm is in a sling.   Pain medication may make you constipated.  Below are a few solutions to try in this order: Decrease the amount of pain medication if you aren't having pain. Drink lots of decaffeinated fluids. Drink prune juice and/or eat dried prunes  If the first 3 don't work start with additional solutions Take Colace - an over-the-counter stool softener Take Senokot - an over-the-counter laxative Take Miralax - a stronger over-the-counter laxative

## 2021-08-24 NOTE — Anesthesia Procedure Notes (Signed)
Procedure Name: Intubation Date/Time: 08/24/2021 7:48 AM Performed by: Orlie Dakin, CRNA Pre-anesthesia Checklist: Patient identified, Emergency Drugs available, Suction available and Patient being monitored Patient Re-evaluated:Patient Re-evaluated prior to induction Oxygen Delivery Method: Circle system utilized Preoxygenation: Pre-oxygenation with 100% oxygen Induction Type: IV induction Ventilation: Mask ventilation without difficulty Laryngoscope Size: Mac and 4 Grade View: Grade II Tube type: Oral Tube size: 7.0 mm Number of attempts: 1 Airway Equipment and Method: Stylet Placement Confirmation: ETT inserted through vocal cords under direct vision, positive ETCO2 and breath sounds checked- equal and bilateral Secured at: 22 cm Tube secured with: Tape Dental Injury: Teeth and Oropharynx as per pre-operative assessment

## 2021-08-24 NOTE — H&P (Signed)
Below is the most recent clinic note for Amber Drake; any pertinent information regarding their recent medical history will be updated on the day of surgery.     Return patient Visit   Assessment: Amber Drake is a 61 y.o. female with the following: Chronic right shoulder pain; partial thickness rotator cuff injury   Plan: Right shoulder glenohumeral injection improved her symptoms for approximately 1 month.  Since then, the pain has returned.  She is tearful in clinic today.  She is interested in surgery if the think it would benefit her.  We reviewed the results of her MRI, which demonstrates some partial-thickness tearing, as well as delamination of the rotator cuff.  She could potentially benefit from arthroscopic surgery, to include rotator cuff repair and biceps tenotomy versus tenodesis.  She is in agreement with this plan.  Surgery was discussed, all questions were answered.  She will obtain surgical clearance.  Once this is been finalized, we can work on scheduling a surgical date.   Follow-up: Return for After medical clearance for OR.   Subjective:       Chief Complaint  Patient presents with   Shoulder Pain      Rt shoulder pain getting worse over past week. States she has pain shooting into her neck.       History of Present Illness: Amber Drake is a 61 y.o. female who presents for repeat evaluation of right shoulder pain.  I last saw her approximately 6 weeks ago.  At that time, we placed an order for a right shoulder glenohumeral joint injection.  This improved her symptoms for approximately month.  Over the past week, she has noted worsening pain in the shoulder.  She now having some pain radiating into her neck.  Pain gets worse at night, when she is at work.     Review of Systems: No fevers or chills No numbness or tingling No chest pain No shortness of breath No bowel or bladder dysfunction No GI distress No headaches     Objective: Ht '5\' 4"'$   (1.626 m)   Wt 166 lb (75.3 kg)   LMP 08/12/2010   BMI 28.49 kg/m    Physical Exam:   General: Alert and oriented. and No acute distress. Gait: Normal gait.   Evaluation of right shoulder demonstrates no deformity.  No atrophy is appreciated.  130 degrees of forward elevation, with pain beyond this range of motion.  Abduction at her side to 90 degrees.  Internal rotation to her lower back.  Strength in the right shoulder is 4/5.  Negative belly press.  IMAGING: I personally reviewed images previously obtained in clinic   MRI of the right shoulder demonstrates high-grade partial-thickness tearing of the rotator cuff tendons.  There are some small areas of full-thickness tearing, at the footprint of the tendons.  She also has a SLAP tear.  Mild degenerative changes noted within the glenohumeral joint.     New Medications:  No orders of the defined types were placed in this encounter.

## 2021-08-24 NOTE — Anesthesia Preprocedure Evaluation (Signed)
Anesthesia Evaluation  Patient identified by MRN, date of birth, ID band Patient awake    Reviewed: Allergy & Precautions, NPO status , Patient's Chart, lab work & pertinent test results  Airway Mallampati: II  TM Distance: >3 FB Neck ROM: Full    Dental  (+) Dental Advisory Given, Upper Dentures, Missing, Loose,    Pulmonary Current Smoker and Patient abstained from smoking.,    Pulmonary exam normal breath sounds clear to auscultation       Cardiovascular Exercise Tolerance: Good hypertension, Pt. on medications Normal cardiovascular exam Rhythm:Regular Rate:Normal     Neuro/Psych APPLICATION OF CRANIAL NAVIGATION 07/28/2018   Neuromuscular disease (PR DURAL GRAFT REPAIR,SPINE DEFECT) negative psych ROS   GI/Hepatic Neg liver ROS, GERD  Controlled,  Endo/Other  negative endocrine ROS  Renal/GU negative Renal ROS  negative genitourinary   Musculoskeletal  (+) Arthritis , Osteoarthritis,    Abdominal   Peds negative pediatric ROS (+)  Hematology  (+) Blood dyscrasia (lymphoma), Sickle cell trait and anemia ,   Anesthesia Other Findings Prednisone 20 mg  Reproductive/Obstetrics negative OB ROS                           Anesthesia Physical Anesthesia Plan  ASA: 3  Anesthesia Plan: General   Post-op Pain Management: Regional block* and Dilaudid IV   Induction: Intravenous  PONV Risk Score and Plan: 3 and Ondansetron, Dexamethasone and Midazolam  Airway Management Planned: Oral ETT  Additional Equipment:   Intra-op Plan:   Post-operative Plan: Extubation in OR  Informed Consent: I have reviewed the patients History and Physical, chart, labs and discussed the procedure including the risks, benefits and alternatives for the proposed anesthesia with the patient or authorized representative who has indicated his/her understanding and acceptance.     Dental advisory given  Plan  Discussed with: CRNA and Surgeon  Anesthesia Plan Comments:         Anesthesia Quick Evaluation

## 2021-08-25 ENCOUNTER — Telehealth: Payer: Self-pay | Admitting: Orthopedic Surgery

## 2021-08-25 ENCOUNTER — Encounter: Payer: Self-pay | Admitting: Orthopedic Surgery

## 2021-08-25 NOTE — Telephone Encounter (Signed)
Done

## 2021-08-25 NOTE — Telephone Encounter (Signed)
Patient called status/post surgery done yesterday, 08/24/21; has question about bandage - asking if she may remove. Asking for Abigail Butts to call her back if nurse is not here in clinic today.  Patient also inquired about whether another form has been received, and asked for a work note to cover until her post op appointment which we have scheduled for 09/06/21.*  *Work note has been entered per request; patient is aware. Please advise regarding bandage.

## 2021-08-25 NOTE — Telephone Encounter (Signed)
I called patient advised and discussed, she has the PO instructions.  Will have [employer] fax [place] another form.

## 2021-08-31 ENCOUNTER — Encounter (HOSPITAL_COMMUNITY): Payer: Self-pay | Admitting: Orthopedic Surgery

## 2021-09-01 ENCOUNTER — Telehealth: Payer: Self-pay | Admitting: Orthopedic Surgery

## 2021-09-01 NOTE — Telephone Encounter (Signed)
Insurance company called and left voicemail at 12:48 pm, wants to confirm details about the surgery.  Please call them back at [phone]

## 2021-09-06 ENCOUNTER — Ambulatory Visit (INDEPENDENT_AMBULATORY_CARE_PROVIDER_SITE_OTHER): Payer: BC Managed Care – PPO

## 2021-09-06 ENCOUNTER — Other Ambulatory Visit: Payer: Self-pay

## 2021-09-06 ENCOUNTER — Ambulatory Visit (INDEPENDENT_AMBULATORY_CARE_PROVIDER_SITE_OTHER): Payer: BC Managed Care – PPO | Admitting: Orthopedic Surgery

## 2021-09-06 ENCOUNTER — Encounter: Payer: Self-pay | Admitting: Orthopedic Surgery

## 2021-09-06 DIAGNOSIS — M75111 Incomplete rotator cuff tear or rupture of right shoulder, not specified as traumatic: Secondary | ICD-10-CM

## 2021-09-06 NOTE — Progress Notes (Signed)
Orthopaedic Postop Note  Assessment: Amber Drake is a 61 y.o. female s/p right shoulder arthroscopy, with rotator cuff repair and biceps tenotomy  DOS: 08/24/21  Plan: Sutures were removed.  Steri-Strips were placed Procedure was discussed Questions were answered Okay to remove the pillow from the sling.  Continue to use the sling per the protocol. Referral for physical therapy has been placed, and protocol has been provided. Follow-up in 4 weeks for repeat evaluation. Call with issues.  Follow-up: Return in about 4 weeks (around 10/04/2021). XR at next visit: None  Subjective:  Chief Complaint  Patient presents with   Routine Post Op    POST OP/DOS 08/24/21/RT SHOULDER    History of Present Illness: Amber Drake is a 61 y.o. female who presents following the above stated procedure.  Surgery was approximately 2 weeks ago.  She is doing very well.  Pain has been managed.  She has no issues at this time.  No numbness or tingling.  Review of Systems: No fevers or chills No numbness or tingling No Chest Pain No shortness of breath   Objective: LMP 08/12/2010   Physical Exam:  Alert and oriented.  No acute distress.  Surgical incisions are healing well.  No surrounding erythema or drainage.  Sensation is intact in the axillary nerve distribution.  Fingers are warm and well-perfused.  Sensation is intact in the right hand.  Active motion intact in the right hand/PIN/U nerve distribution.  IMAGING: I personally ordered and reviewed the following images:  The right shoulder were obtained in clinic today.  No acute injuries noted.  Well-positioned glenohumeral joint.  Glenohumeral joint space is maintained.    Impression: Negative right shoulder x-ray   Mordecai Rasmussen, MD 09/06/2021 3:40 PM

## 2021-09-07 ENCOUNTER — Encounter (HOSPITAL_COMMUNITY): Payer: Self-pay | Admitting: Occupational Therapy

## 2021-09-07 ENCOUNTER — Ambulatory Visit (HOSPITAL_COMMUNITY): Payer: BC Managed Care – PPO | Attending: Orthopedic Surgery | Admitting: Occupational Therapy

## 2021-09-07 DIAGNOSIS — M25611 Stiffness of right shoulder, not elsewhere classified: Secondary | ICD-10-CM | POA: Insufficient documentation

## 2021-09-07 DIAGNOSIS — R29898 Other symptoms and signs involving the musculoskeletal system: Secondary | ICD-10-CM | POA: Insufficient documentation

## 2021-09-07 DIAGNOSIS — M25511 Pain in right shoulder: Secondary | ICD-10-CM | POA: Insufficient documentation

## 2021-09-07 NOTE — Patient Instructions (Signed)
1) SHOULDER: Flexion On Table   Place hands on towel placed on table, elbows straight. Lean forward with you upper body, pushing towel away from body.  _10-15__ reps per set, _3__ sets per day  2) Abduction (Passive)   With arm out to side, resting on towel placed on table with palm DOWN, keeping trunk away from table, lean to the side while pushing towel away from body.  Repeat __10-15__ times. Do __3__ sessions per day.  Copyright  V[employer]. All rights reserved.     3) Internal Rotation (Assistive)   Seated with elbow bent at right angle and held against side, slide arm on table surface in an inward arc keeping elbow anchored in place. Repeat _10-15___ times. Do __3__ sessions per day. Activity: Use this motion to brush crumbs off the table.

## 2021-09-07 NOTE — Telephone Encounter (Signed)
Spoke with insurance, questions answered.

## 2021-09-07 NOTE — Therapy (Signed)
OUTPATIENT OCCUPATIONAL THERAPY ORTHO EVALUATION  Patient Name: Amber Drake MRN: [id number] DOB:Apr 23, 1960, 61 y.o., female Today's Date: 09/07/2021  PCP: Dr. [name] REFERRING PROVIDER: Dr. Larena Glassman   OT End of Session - 09/07/21 1136     Visit Number 1    Number of Visits 16    Date for OT Re-Evaluation 11/06/21   mini-reassessment 10/05/2021   Authorization Type 1) BCBS Commercial 2) UHC Medicaid    Authorization Time Period UHC Medicaid: No auth required at this time, 27 visits per year combined PT/OT/SP    Authorization - Visit Number 0    Authorization - Number of Visits 27    OT Start Time 0950    OT Stop Time 1018    OT Time Calculation (min) 28 min    Activity Tolerance Patient tolerated treatment well    Behavior During Therapy WFL for tasks assessed/performed             Past Medical History:  Diagnosis Date   Anemia    Arthritis    Blood dyscrasia    sickle cell trait   Cancer ([place])    lymphoma   Carbuncle and furuncle    Hypercholesterolemia    Hypertension    does not take meds   Papanicolaou smear of cervix with positive high risk human papilloma virus (HPV) test 08/23/2020   08/23/20    [place]po per ASCCP guidelines, immediate risk of CIN 3+ is 4.1 %   Sickle cell trait ([place])    Past Surgical History:  Procedure Laterality Date   APPENDECTOMY     APPLICATION OF CRANIAL NAVIGATION N/A 07/28/2018   Procedure: APPLICATION OF CRANIAL NAVIGATION;  Surgeon: Kary Kos, MD;  Location: [place];  Service: Neurosurgery;  Laterality: N/A;   ARTHOSCOPIC ROTAOR CUFF REPAIR Right 08/24/2021   Procedure: ARTHROSCOPIC ROTATOR CUFF REPAIR;  Surgeon: Mordecai Rasmussen, MD;  [facility];  Service: Orthopedics;  Laterality: Right;   BRAIN SURGERY  2020   CARPAL TUNNEL RELEASE  03/23/2011   Procedure: CARPAL TUNNEL RELEASE;  Surgeon: Sanjuana Kava;  [facility];  Service: Orthopedics;  Laterality: Left;   CARPAL TUNNEL RELEASE  05/10/2011   Procedure:  CARPAL TUNNEL RELEASE;  Surgeon: Sanjuana Kava, MD;  [facility];  Service: Orthopedics;  Laterality: Right;   CESAREAN SECTION     x 2   COLONOSCOPY WITH PROPOFOL N/A 11/16/2020   Procedure: COLONOSCOPY WITH PROPOFOL;  Surgeon: Harvel Quale, MD;  [facility];  Service: Gastroenterology;  Laterality: N/A;  9:15   INCISION AND DRAINAGE PERIRECTAL ABSCESS  12/21/2009   LUMBAR LAMINECTOMY/DECOMPRESSION MICRODISCECTOMY Left 12/12/2020   Procedure: Laminectomy and Foraminotomy - left - L5-S1;  Surgeon: Kary Kos, MD;  Location: [place];  Service: Neurosurgery;  Laterality: Left;  3C   PR DURAL GRAFT REPAIR,SPINE DEFECT N/A 07/28/2018   Procedure: Stereotactic open biopsy of Right cerebellar hemisphere and dura with brainlab;  Surgeon: Kary Kos, MD;  Location: [place];  Service: Neurosurgery;  Laterality: N/A;  Stereotactic open biopsy of Right cerebellar hemisphere and dura with brainlab   PROCTOSCOPY  10/17/2010   Procedure: PROCTOSCOPY;  Surgeon: Scherry Ran;  [facility];  Service: General;  Laterality: N/A;  Rigid Proctoscopy/Possible Fistula in Ano  Procedure ended at [place] Right 08/24/2021   Procedure: SHOULDER ARTHROSCOPY WITH BICEPSTENOTOMY;  Surgeon: Mordecai Rasmussen, MD;  [facility];  Service: Orthopedics;  Laterality: Right;   THERAPEUTIC ABORTION  x2   Patient Active Problem List   Diagnosis Date Noted   Hidradenitis suppurativa 08/18/2021   Spinal stenosis of lumbar region 12/12/2020   Papanicolaou smear of cervix with positive high risk human papilloma virus (HPV) test 08/23/2020   Hemorrhoids 08/16/2020   Encounter for screening fecal occult blood testing 08/16/2020   Encounter for gynecological examination with Papanicolaou smear of cervix 08/16/2020   Allergic reaction to adhesive 08/16/2020   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   Counseling regarding advance care  planning and goals of care 10/23/2018   Diffuse large B cell lymphoma ([place]) 08/11/2018   Brain tumor ([place]) 07/28/2018   ANA positive 03/24/2018   Carpal tunnel syndrome on both sides 03/24/2018   Chronic pain of both shoulders 03/24/2018   Hidradenitis 07/31/2016   Recurrent boils 07/31/2016   Hyperlipidemia 06/15/2015   Pain of left hand 05/19/2015   Hand pain, right 05/19/2015   Hematuria 03/03/2015   Esophageal reflux 03/03/2015   Essential hypertension, benign 03/03/2015   Bronchitis due to tobacco use 10/17/2010    ONSET DATE: 08/24/21  REFERRING DIAG: s/p right RCR  THERAPY DIAG:  Acute pain of right shoulder  Stiffness of right shoulder, not elsewhere classified  Other symptoms and signs involving the musculoskeletal system  Rationale for Evaluation and Treatment Rehabilitation  SUBJECTIVE:   SUBJECTIVE STATEMENT: S: It's hurting.  Pt accompanied by: self  PERTINENT HISTORY: Pt is a 61 y/o female s/p right RCR on 08/24/21, presenting with RUE functional deficits limiting ability to perform ADLs using RUE as dominant. Presents in sling, pt was referred to occupational therapy for evaluation and treatment by Dr. Larena Glassman.   PRECAUTIONS: Shoulder: See protocol  WEIGHT BEARING RESTRICTIONS Yes NWB  PAIN:  Are you having pain? Yes: NPRS scale: 8/10 Pain location: right shoulder Pain description: aching, sore Aggravating factors: laying down, moving it Relieving factors: pain medication  FALLS: Has patient fallen in last 6 months? No   PLOF: Independent  PATIENT GOALS To be able to use her right arm during daily tasks.   OBJECTIVE:   HAND DOMINANCE: Right  ADLs: Overall ADLs: Pt is unable to use RUE for ADLs. 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    FUNCTIONAL OUTCOME MEASURES: FOTO: 4/100  UPPER EXTREMITY ROM      Assessed seated, er/IR adducted Passive ROM Right eval  Shoulder flexion 62   Shoulder abduction 45  Shoulder internal rotation 90  Shoulder external rotation 51  (Blank rows = not tested)   UPPER EXTREMITY MMT:     Unable to assess due to precautions MMT Right eval  Shoulder flexion   Shoulder abduction   Shoulder internal rotation   Shoulder external rotation   (Blank rows = not tested)  COGNITION: Overall cognitive status: Within functional limits for tasks assessed    PATIENT EDUCATION: Education details: Table slides Person educated: Patient Education method: Explanation, Demonstration, and Handouts Education comprehension: verbalized understanding and returned demonstration   HOME EXERCISE PROGRAM: Eval: table slides  GOALS: Goals reviewed with patient? Yes  SHORT TERM GOALS: Target date: 10/05/2021    Pt will be provided with HEP to improve mobility of RUE required for ADL completion.   Goal status: INITIAL  2.  Pt will increase RUE P/ROM to [facility] to improve ability to perform dressing tasks with minimal compensatory strategies and techniques.   Goal status: INITIAL  3.  Pt will increase RUE strength to  3/5 or greater to improve ability to reach for items at waist to chest height during functional tasks.   Goal status: INITIAL     LONG TERM GOALS: Target date: 11/02/2021    Pt will return to highest level of functioning using RUE as dominant during ADL and work tasks.   Goal status: INITIAL  2.  Pt will decrease pain in RUE to 3/10 or less to improve ability to use RUE as dominant during housework tasks.   Goal status: INITIAL  3.  Pt will decrease RUE fascial restrictions to min amounts or less to improve mobility required for functional reaching tasks at home and work.   Goal status: INITIAL  4.  Pt will increase RUE A/ROM to [facility] to improve ability to reaching overhead and behind back during dressing and bathing tasks.   Goal status: INITIAL  5.  Pt will increase RUE strength to 4+/5 or greater to improve ability to  perform lifting tasks at work.    Goal status: INITIAL   ASSESSMENT:  CLINICAL IMPRESSION: Patient is a 61 y.o. female who was seen today for occupational therapy evaluation s/p right RCR on 08/24/21. Pt presents in sling, OT assisting with adjusting sling for optimal support and positioning. Pt very tender during palpation, max guarding during passive stretching and ROM. Consistent verbal cuing for relaxing RUE to allow for measurements. Discussed taking pain medication prior to therapy to improve tolerance to therapy tasks.    PERFORMANCE DEFICITS in functional skills including ADLs, IADLs, ROM, strength, pain, fascial restrictions, muscle spasms, and UE functional use  IMPAIRMENTS are limiting patient from ADLs, IADLs, rest and sleep, work, and leisure.   COMORBIDITIES has no other co-morbidities that affects occupational performance. Patient will benefit from skilled OT to address above impairments and improve overall function.  MODIFICATION OR ASSISTANCE TO COMPLETE EVALUATION: No modification of tasks or assist necessary to complete an evaluation.  OT OCCUPATIONAL PROFILE AND HISTORY: Problem focused assessment: Including review of records relating to presenting problem.  CLINICAL DECISION MAKING: LOW - limited treatment options, no task modification necessary  REHAB POTENTIAL: Good  EVALUATION COMPLEXITY: Low      PLAN: OT FREQUENCY: 2x/week  OT DURATION: 8 weeks  PLANNED INTERVENTIONS: self care/ADL training, therapeutic exercise, therapeutic activity, manual therapy, passive range of motion, splinting, electrical stimulation, ultrasound, moist heat, cryotherapy, patient/family education, and DME and/or AE instructions   CONSULTED AND AGREED WITH PLAN OF CARE: Patient  PLAN FOR NEXT SESSION: Pt will benefit from skilled OT services to decrease pain and fascial restrictions, increase joint ROM, strength, and functional use of RUE as dominant.    Guadelupe Sabin, OTR/L   [phone] 09/07/2021, 11:56 AM

## 2021-09-15 ENCOUNTER — Ambulatory Visit (HOSPITAL_COMMUNITY): Payer: BC Managed Care – PPO | Admitting: Occupational Therapy

## 2021-09-18 ENCOUNTER — Encounter (HOSPITAL_COMMUNITY): Payer: BC Managed Care – PPO | Admitting: Occupational Therapy

## 2021-09-19 ENCOUNTER — Encounter (HOSPITAL_COMMUNITY): Payer: Self-pay | Admitting: Occupational Therapy

## 2021-09-19 ENCOUNTER — Telehealth: Payer: Self-pay | Admitting: Orthopedic Surgery

## 2021-09-19 ENCOUNTER — Ambulatory Visit (HOSPITAL_COMMUNITY): Payer: BC Managed Care – PPO | Attending: Internal Medicine | Admitting: Occupational Therapy

## 2021-09-19 DIAGNOSIS — Z9889 Other specified postprocedural states: Secondary | ICD-10-CM | POA: Diagnosis not present

## 2021-09-19 DIAGNOSIS — M25511 Pain in right shoulder: Secondary | ICD-10-CM | POA: Insufficient documentation

## 2021-09-19 DIAGNOSIS — M75111 Incomplete rotator cuff tear or rupture of right shoulder, not specified as traumatic: Secondary | ICD-10-CM | POA: Insufficient documentation

## 2021-09-19 DIAGNOSIS — R29898 Other symptoms and signs involving the musculoskeletal system: Secondary | ICD-10-CM

## 2021-09-19 DIAGNOSIS — M25611 Stiffness of right shoulder, not elsewhere classified: Secondary | ICD-10-CM

## 2021-09-19 NOTE — Telephone Encounter (Signed)
Spoke with pt and let her know Dr. Amedeo Kinsman only does pain medications for up to 6 weeks after surgery. Pt verbalized understanding and states that her provider Simeon Craft, [place]) is ok with Dr. Amedeo Kinsman giving her some pain medication at this time.

## 2021-09-19 NOTE — Therapy (Signed)
OUTPATIENT OCCUPATIONAL THERAPY TREATMENT NOTE   Patient Name: Amber Drake MRN: [id number] DOB:08/07/1960, 61 y.o., female Today's Date: 09/19/2021  PCP: Dr. [name] REFERRING PROVIDER: Dr. Larena Glassman  END OF SESSION:   OT End of Session - 09/19/21 1213     Visit Number 2    Number of Visits 16    Date for OT Re-Evaluation 11/06/21   mini-reassessment 10/05/2021   Authorization Type 1) BCBS Commercial 2) UHC Medicaid    Authorization Time Period UHC Medicaid: No auth required at this time, 27 visits per year combined PT/OT/SP    Authorization - Visit Number 1    Authorization - Number of Visits 27    OT Start Time 1115    OT Stop Time 1155    OT Time Calculation (min) 40 min    Activity Tolerance Patient tolerated treatment well    Behavior During Therapy WFL for tasks assessed/performed             Past Medical History:  Diagnosis Date   Anemia    Arthritis    Blood dyscrasia    sickle cell trait   Cancer ([place])    lymphoma   Carbuncle and furuncle    Hypercholesterolemia    Hypertension    does not take meds   Papanicolaou smear of cervix with positive high risk human papilloma virus (HPV) test 08/23/2020   08/23/20    [place] per ASCCP guidelines, immediate risk of CIN 3+ is 4.1 %   Sickle cell trait ([place])    Past Surgical History:  Procedure Laterality Date   APPENDECTOMY     APPLICATION OF CRANIAL NAVIGATION N/A 07/28/2018   Procedure: APPLICATION OF CRANIAL NAVIGATION;  Surgeon: Kary Kos, MD;  Location: [place];  Service: Neurosurgery;  Laterality: N/A;   ARTHOSCOPIC ROTAOR CUFF REPAIR Right 08/24/2021   Procedure: ARTHROSCOPIC ROTATOR CUFF REPAIR;  Surgeon: Mordecai Rasmussen, MD;  [facility];  Service: Orthopedics;  Laterality: Right;   BRAIN SURGERY  2020   CARPAL TUNNEL RELEASE  03/23/2011   Procedure: CARPAL TUNNEL RELEASE;  Surgeon: Sanjuana Kava;  [facility];  Service: Orthopedics;  Laterality: Left;   CARPAL TUNNEL RELEASE  05/10/2011    Procedure: CARPAL TUNNEL RELEASE;  Surgeon: Sanjuana Kava, MD;  [facility];  Service: Orthopedics;  Laterality: Right;   CESAREAN SECTION     x 2   COLONOSCOPY WITH PROPOFOL N/A 11/16/2020   Procedure: COLONOSCOPY WITH PROPOFOL;  Surgeon: Harvel Quale, MD;  [facility];  Service: Gastroenterology;  Laterality: N/A;  9:15   INCISION AND DRAINAGE PERIRECTAL ABSCESS  12/21/2009   LUMBAR LAMINECTOMY/DECOMPRESSION MICRODISCECTOMY Left 12/12/2020   Procedure: Laminectomy and Foraminotomy - left - L5-S1;  Surgeon: Kary Kos, MD;  Location: [place];  Service: Neurosurgery;  Laterality: Left;  3C   PR DURAL GRAFT REPAIR,SPINE DEFECT N/A 07/28/2018   Procedure: Stereotactic open biopsy of Right cerebellar hemisphere and dura with brainlab;  Surgeon: Kary Kos, MD;  Location: [place];  Service: Neurosurgery;  Laterality: N/A;  Stereotactic open biopsy of Right cerebellar hemisphere and dura with brainlab   PROCTOSCOPY  10/17/2010   Procedure: PROCTOSCOPY;  Surgeon: Scherry Ran;  [facility];  Service: General;  Laterality: N/A;  Rigid Proctoscopy/Possible Fistula in Ano  Procedure ended at [place] Right 08/24/2021   Procedure: SHOULDER ARTHROSCOPY WITH BICEPSTENOTOMY;  Surgeon: Mordecai Rasmussen, MD;  [facility];  Service: Orthopedics;  Laterality:  Right;   THERAPEUTIC ABORTION     x2   Patient Active Problem List   Diagnosis Date Noted   Hidradenitis suppurativa 08/18/2021   Spinal stenosis of lumbar region 12/12/2020   Papanicolaou smear of cervix with positive high risk human papilloma virus (HPV) test 08/23/2020   Hemorrhoids 08/16/2020   Encounter for screening fecal occult blood testing 08/16/2020   Encounter for gynecological examination with Papanicolaou smear of cervix 08/16/2020   Allergic reaction to adhesive 08/16/2020   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   Counseling regarding  advance care planning and goals of care 10/23/2018   Diffuse large B cell lymphoma ([place]) 08/11/2018   Brain tumor ([place]) 07/28/2018   ANA positive 03/24/2018   Carpal tunnel syndrome on both sides 03/24/2018   Chronic pain of both shoulders 03/24/2018   Hidradenitis 07/31/2016   Recurrent boils 07/31/2016   Hyperlipidemia 06/15/2015   Pain of left hand 05/19/2015   Hand pain, right 05/19/2015   Hematuria 03/03/2015   Esophageal reflux 03/03/2015   Essential hypertension, benign 03/03/2015   Bronchitis due to tobacco use 10/17/2010    ONSET DATE: 08/24/21  REFERRING DIAG: s/p right RCR  THERAPY DIAG:  Stiffness of right shoulder, not elsewhere classified  Acute pain of right shoulder  Other symptoms and signs involving the musculoskeletal system  Rationale for Evaluation and Treatment Rehabilitation  PERTINENT HISTORY: Pt is a 61 y/o female s/p right RCR on 08/24/21, presenting with RUE functional deficits limiting ability to perform ADLs using RUE as dominant. Presents in sling, pt was referred to occupational therapy for evaluation and treatment by Dr. Larena Glassman.   PRECAUTIONS: see protocol   WEIGHT BEARING RESTRICTIONS Yes NWB  SUBJECTIVE: S: Pt reports that last Saturday she was getting out of the card and stepped in a hole, causing intense pain in her shoulder. Pain is limiting sleep. She has a follow up with physician 10/10/21 but plans to ask for pain medication today.  PAIN:  Are you having pain? Yes: NPRS scale: 8/10 Pain location: Incision site Pain description: Sore, sharp Aggravating factors: movement  Relieving factors: rest, ice    PATIENT GOALS To be able to use her right arm during daily tasks.  OBJECTIVE:   HAND DOMINANCE: Right  FUNCTIONAL OUTCOME MEASURES: FOTO: 4/100   UPPER EXTREMITY ROM        Assessed seated, er/IR adducted Passive ROM Right eval  Shoulder flexion 62  Shoulder abduction 45  Shoulder internal rotation 90  Shoulder  external rotation 51  (Blank rows = not tested)     UPPER EXTREMITY MMT:      Unable to assess due to precautions MMT Right eval  Shoulder flexion    Shoulder abduction    Shoulder internal rotation    Shoulder external rotation    (Blank rows = not tested)       PATIENT EDUCATION: Education details: Pain management and elbow ROM Person educated: Patient Education method: Explanation, Demonstration, and Handouts Education comprehension: verbalized understanding and returned demonstration     HOME EXERCISE PROGRAM: Eval: table slides 6/20: Elbow AROM   GOALS: Goals reviewed with patient? Yes   SHORT TERM GOALS: Target date: 10/05/2021     Pt will be provided with HEP to improve mobility of RUE required for ADL completion.    Goal status: Ongoing   2.  Pt will increase RUE P/ROM to [facility] to improve ability to perform dressing tasks with minimal compensatory strategies and techniques.  Goal status: Ongoing   3.  Pt will increase RUE strength to 3/5 or greater to improve ability to reach for items at waist to chest height during functional tasks.    Goal status: Ongoing       LONG TERM GOALS: Target date: 11/02/2021     Pt will return to highest level of functioning using RUE as dominant during ADL and work tasks.    Goal status: Ongoing   2.  Pt will decrease pain in RUE to 3/10 or less to improve ability to use RUE as dominant during housework tasks.    Goal status: Ongoing   3.  Pt will decrease RUE fascial restrictions to min amounts or less to improve mobility required for functional reaching tasks at home and work.    Goal status: Ongoing   4.  Pt will increase RUE A/ROM to [facility] to improve ability to reaching overhead and behind back during dressing and bathing tasks.    Goal status: Ongoing   5.  Pt will increase RUE strength to 4+/5 or greater to improve ability to perform lifting tasks at work.    Goal status: Ongoing    TODAY'S  TREATMENT:  09/19/21 -P/ROM: Pt in supine, RUE supported by pillow; shoulder flexion, shoulder abduction, IR/er, 10 reps -Isometrics: 4x3" shoulder flexion, extension, and abduction -Table slides: seated forward flexion at table and standing forward flexion at railing 1x10 reps each   ASSESSMENT:   CLINICAL IMPRESSION: A:Pt presenting with high levels of reported pain today, requiring increased rest breaks with P/ROM along with max therapist verbal cuing for deep breathing to manage pain levels throughout. Discussed various forms of pain management and the importance to introducing movement to reduce muscle guarding and reduce pain. Completed table slides with therapist providing verbal cues for form. Pt reporting decreased pain, 6/10 at end of session.    PLAN:   OT FREQUENCY: 2x/week  OT DURATION: 8 weeks  PLANNED INTERVENTIONS: self care/ADL training, therapeutic exercise, therapeutic activity, manual therapy, passive range of motion, splinting, electrical stimulation, ultrasound, moist heat, patient/family education, and DME and/or AE instructions  RECOMMENDED OTHER SERVICES: Pt will benefit from skilled OT services to decrease pain and fascial restrictions, increase joint ROM, strength, and functional use of RUE as dominant.  CONSULTED AND AGREED WITH PLAN OF CARE: Patient  PLAN FOR NEXT SESSION: P: Discussed discontinuation of sling. Continue with P/ROM, introduce AA/ROM as tolerated, grip strengthening            Guadelupe Sabin, OTR/L  [phone] 09/19/2021, 12:15 PM

## 2021-09-19 NOTE — Telephone Encounter (Signed)
Call from patient received - poor reception on the call. Patient relayed that she just was seen by her neurosurgeon office in [place]; said she is under pain management, and that it would be okay if her orthopaedic surgeon Dr Amedeo Kinsman to prescribe 'something stronger'. Please call patient to advise.

## 2021-09-20 ENCOUNTER — Other Ambulatory Visit: Payer: Self-pay | Admitting: Orthopedic Surgery

## 2021-09-20 MED ORDER — OXYCODONE HCL 5 MG PO TABS: 5.0000 mg | ORAL_TABLET | Freq: Four times a day (QID) | ORAL | 0 refills | Status: AC | PRN

## 2021-09-22 ENCOUNTER — Encounter (HOSPITAL_COMMUNITY): Payer: BC Managed Care – PPO | Admitting: Occupational Therapy

## 2021-09-25 ENCOUNTER — Telehealth (HOSPITAL_COMMUNITY): Payer: Self-pay

## 2021-09-25 ENCOUNTER — Encounter (HOSPITAL_COMMUNITY): Payer: BC Managed Care – PPO

## 2021-09-29 ENCOUNTER — Encounter (HOSPITAL_COMMUNITY): Payer: Self-pay

## 2021-09-29 ENCOUNTER — Ambulatory Visit (HOSPITAL_COMMUNITY): Payer: BC Managed Care – PPO | Attending: Orthopedic Surgery

## 2021-09-29 DIAGNOSIS — R29898 Other symptoms and signs involving the musculoskeletal system: Secondary | ICD-10-CM | POA: Diagnosis not present

## 2021-09-29 DIAGNOSIS — M25511 Pain in right shoulder: Secondary | ICD-10-CM | POA: Insufficient documentation

## 2021-09-29 DIAGNOSIS — M25611 Stiffness of right shoulder, not elsewhere classified: Secondary | ICD-10-CM | POA: Diagnosis not present

## 2021-09-29 NOTE — Therapy (Signed)
OUTPATIENT OCCUPATIONAL THERAPY TREATMENT NOTE   Patient Name: Amber Drake MRN: [id number] DOB:11-Aug-1960, 61 y.o., female Today's Date: 09/29/2021  PCP: Dr. [name] REFERRING PROVIDER: Dr. Larena Glassman  END OF SESSION:   OT End of Session - 09/29/21 1036     Visit Number 3    Number of Visits 16    Date for OT Re-Evaluation 11/06/21   mini-reassessment 10/05/2021   Authorization Type 1) BCBS Commercial 2) UHC Medicaid    Authorization Time Period UHC Medicaid: No auth required at this time, 27 visits per year combined PT/OT/SP    Authorization - Visit Number 2    Authorization - Number of Visits 27    OT Start Time 1035    OT Stop Time 1108    OT Time Calculation (min) 33 min    Activity Tolerance Patient tolerated treatment well    Behavior During Therapy WFL for tasks assessed/performed             Past Medical History:  Diagnosis Date   Anemia    Arthritis    Blood dyscrasia    sickle cell trait   Cancer ([place])    lymphoma   Carbuncle and furuncle    Hypercholesterolemia    Hypertension    does not take meds   Papanicolaou smear of cervix with positive high risk human papilloma virus (HPV) test 08/23/2020   08/23/20    Colpo per ASCCP guidelines, immediate risk of CIN 3+ is 4.1 %   Sickle cell trait ([place])    Past Surgical History:  Procedure Laterality Date   APPENDECTOMY     APPLICATION OF CRANIAL NAVIGATION N/A 07/28/2018   Procedure: APPLICATION OF CRANIAL NAVIGATION;  Surgeon: Kary Kos, MD;  Location: [place];  Service: Neurosurgery;  Laterality: N/A;   ARTHOSCOPIC ROTAOR CUFF REPAIR Right 08/24/2021   Procedure: ARTHROSCOPIC ROTATOR CUFF REPAIR;  Surgeon: Mordecai Rasmussen, MD;  [facility];  Service: Orthopedics;  Laterality: Right;   BRAIN SURGERY  2020   CARPAL TUNNEL RELEASE  03/23/2011   Procedure: CARPAL TUNNEL RELEASE;  Surgeon: Sanjuana Kava;  [facility];  Service: Orthopedics;  Laterality: Left;   CARPAL TUNNEL RELEASE  05/10/2011    Procedure: CARPAL TUNNEL RELEASE;  Surgeon: Sanjuana Kava, MD;  [facility];  Service: Orthopedics;  Laterality: Right;   CESAREAN SECTION     x 2   COLONOSCOPY WITH PROPOFOL N/A 11/16/2020   Procedure: COLONOSCOPY WITH PROPOFOL;  Surgeon: Harvel Quale, MD;  [facility];  Service: Gastroenterology;  Laterality: N/A;  9:15   INCISION AND DRAINAGE PERIRECTAL ABSCESS  12/21/2009   LUMBAR LAMINECTOMY/DECOMPRESSION MICRODISCECTOMY Left 12/12/2020   Procedure: Laminectomy and Foraminotomy - left - L5-S1;  Surgeon: Kary Kos, MD;  Location: [place];  Service: Neurosurgery;  Laterality: Left;  3C   PR DURAL GRAFT REPAIR,SPINE DEFECT N/A 07/28/2018   Procedure: Stereotactic open biopsy of Right cerebellar hemisphere and dura with brainlab;  Surgeon: Kary Kos, MD;  Location: [facility];  Service: Neurosurgery;  Laterality: N/A;  Stereotactic open biopsy of Right cerebellar hemisphere and dura with brainlab   PROCTOSCOPY  10/17/2010   Procedure: PROCTOSCOPY;  Surgeon: Scherry Ran;  [facility]: [facility];  Service: General;  Laterality: N/A;  Rigid Proctoscopy/Possible Fistula in Ano  Procedure ended at [place] Right 08/24/2021   Procedure: SHOULDER ARTHROSCOPY WITH BICEPSTENOTOMY;  Surgeon: Mordecai Rasmussen, MD;  [facility]: [facility];  Service: Orthopedics;  Laterality:  Right;   THERAPEUTIC ABORTION     x2   Patient Active Problem List   Diagnosis Date Noted   Hidradenitis suppurativa 08/18/2021   Spinal stenosis of lumbar region 12/12/2020   Papanicolaou smear of cervix with positive high risk human papilloma virus (HPV) test 08/23/2020   Hemorrhoids 08/16/2020   Encounter for screening fecal occult blood testing 08/16/2020   Encounter for gynecological examination with Papanicolaou smear of cervix 08/16/2020   Allergic reaction to adhesive 08/16/2020   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   Counseling regarding  advance care planning and goals of care 10/23/2018   Diffuse large B cell lymphoma ([place]) 08/11/2018   Brain tumor ([place]) 07/28/2018   ANA positive 03/24/2018   Carpal tunnel syndrome on both sides 03/24/2018   Chronic pain of both shoulders 03/24/2018   Hidradenitis 07/31/2016   Recurrent boils 07/31/2016   Hyperlipidemia 06/15/2015   Pain of left hand 05/19/2015   Hand pain, right 05/19/2015   Hematuria 03/03/2015   Esophageal reflux 03/03/2015   Essential hypertension, benign 03/03/2015   Bronchitis due to tobacco use 10/17/2010    ONSET DATE: 08/24/21  REFERRING DIAG: s/p right RCR  THERAPY DIAG:  Stiffness of right shoulder, not elsewhere classified  Acute pain of right shoulder  Other symptoms and signs involving the musculoskeletal system  Rationale for Evaluation and Treatment Rehabilitation  PERTINENT HISTORY: Pt is a 60 y/o female s/p right RCR on 08/24/21, presenting with RUE functional deficits limiting ability to perform ADLs using RUE as dominant. Presents in sling, pt was referred to occupational therapy for evaluation and treatment by Dr. Larena Glassman.   PRECAUTIONS: see protocol   WEIGHT BEARING RESTRICTIONS Yes NWB  SUBJECTIVE: S: It's been hurting.   Pt reported carrying groceries "the other day, some frozen hamburger and stuff." She reports only wearing her sling when going out in the community and sleeping on the right side.    PAIN:  Are you having pain? Yes: NPRS scale: 7 /10 Pain location: Incision site Pain description: Sore, sharp Aggravating factors: movement  Relieving factors: rest, ice    PATIENT GOALS To be able to use her right arm during daily tasks.  OBJECTIVE:   HAND DOMINANCE: Right  FUNCTIONAL OUTCOME MEASURES: FOTO: 4/100   UPPER EXTREMITY ROM        Assessed seated, er/IR adducted Passive ROM Right eval  Shoulder flexion 62  Shoulder abduction 45  Shoulder internal rotation 90  Shoulder external rotation 51  (Blank  rows = not tested)     UPPER EXTREMITY MMT:      Unable to assess due to precautions MMT Right eval  Shoulder flexion    Shoulder abduction    Shoulder internal rotation    Shoulder external rotation    (Blank rows = not tested)       PATIENT EDUCATION: Education details: Pain management (alternating between heat and ice), support right arm while sleeping, do not sleep on right arm, do not carry groceries, deep breathing Person educated: Patient Education method: Explanation, Demonstration Education comprehension: verbalized understanding      HOME EXERCISE PROGRAM: Eval: table slides 6/20: Elbow AROM   GOALS: Goals reviewed with patient? Yes   SHORT TERM GOALS: Target date: 10/05/2021     Pt will be provided with HEP to improve mobility of RUE required for ADL completion.    Goal status: Ongoing   2.  Pt will increase RUE P/ROM to [facility] to improve ability to perform  dressing tasks with minimal compensatory strategies and techniques.    Goal status: Ongoing   3.  Pt will increase RUE strength to 3/5 or greater to improve ability to reach for items at waist to chest height during functional tasks.    Goal status: Ongoing       LONG TERM GOALS: Target date: 11/02/2021     Pt will return to highest level of functioning using RUE as dominant during ADL and work tasks.    Goal status: Ongoing   2.  Pt will decrease pain in RUE to 3/10 or less to improve ability to use RUE as dominant during housework tasks.    Goal status: Ongoing   3.  Pt will decrease RUE fascial restrictions to min amounts or less to improve mobility required for functional reaching tasks at home and work.    Goal status: Ongoing   4.  Pt will increase RUE A/ROM to [facility] to improve ability to reaching overhead and behind back during dressing and bathing tasks.    Goal status: Ongoing   5.  Pt will increase RUE strength to 4+/5 or greater to improve ability to perform lifting tasks at work.     Goal status: Ongoing    TODAY'S TREATMENT:  09/29/21 -Seated PROM: 1x10 shoulder flexion, abduction, IR/er -Seated AA/ROM: 1x10 shoulder flexion  -Scapular ROM: 1x10 seated row and elevation/depression   09/19/21 -P/ROM: Pt in supine, RUE supported by pillow; shoulder flexion, shoulder abduction, IR/er, 10 reps -Isometrics: 4x3" shoulder flexion, extension, and abduction -Table slides: seated forward flexion at table and standing forward flexion at railing 1x10 reps each   ASSESSMENT:   CLINICAL IMPRESSION: A: Initially started with supine P/ROM with pt reporting increased pain after several repetitions, pt with significant guarding and some muscle spasms, tightness noted at anterior shoulder. Therapist instructing on deep breathing, positioned pt seated EOB, supported UE with pillows and completed manual techniques to decrease pain and muscle spasms. Completed the remaining P/ROM stretches seated with therapist providing constant verbal cues for deep breathing and to prevent muscle guarding. Discussed the importance of supporting the RUE, especially when sleeping. Reiterated that pt should not be carrying anything over 5 lbs with the RUE. Pt stated that the arm has not felt the same since she stepped in a hole the other week and that she is hoping for new pain medication. No pain reported with scapular range of motion and only minimal discomfort noted with AAROM.    PLAN:   OT FREQUENCY: 2x/week  OT DURATION: 8 weeks  PLANNED INTERVENTIONS: self care/ADL training, therapeutic exercise, therapeutic activity, manual therapy, passive range of motion, splinting, electrical stimulation, ultrasound, moist heat, patient/family education, and DME and/or AE instructions  RECOMMENDED OTHER SERVICES: Pt will benefit from skilled OT services to decrease pain and fascial restrictions, increase joint ROM, strength, and functional use of RUE as dominant.  CONSULTED AND AGREED WITH PLAN OF CARE:  Patient  PLAN FOR NEXT SESSION: P: Follow up on pain and discuss scheduling an appointment with physician. Grip strengthening. Continue with P/ROM seated to prevent extension          Flonnie Hailstone, OTD, OTR/L [phone]  09/29/2021, 12:21 PM

## 2021-10-05 ENCOUNTER — Ambulatory Visit (HOSPITAL_COMMUNITY): Payer: BC Managed Care – PPO | Attending: Orthopedic Surgery

## 2021-10-05 ENCOUNTER — Encounter (HOSPITAL_COMMUNITY): Payer: Self-pay

## 2021-10-05 DIAGNOSIS — R29898 Other symptoms and signs involving the musculoskeletal system: Secondary | ICD-10-CM | POA: Diagnosis not present

## 2021-10-05 DIAGNOSIS — M25611 Stiffness of right shoulder, not elsewhere classified: Secondary | ICD-10-CM | POA: Diagnosis not present

## 2021-10-05 DIAGNOSIS — M25511 Pain in right shoulder: Secondary | ICD-10-CM | POA: Insufficient documentation

## 2021-10-05 NOTE — Therapy (Signed)
OUTPATIENT OCCUPATIONAL THERAPY TREATMENT NOTE   Patient Name: Amber Drake MRN: [id number] DOB:07-20-1960, 61 y.o., female Today's Date: 10/05/2021  PCP: Dr. [name] REFERRING PROVIDER: Dr. Larena Glassman  END OF SESSION:   OT End of Session - 10/05/21 1557     Visit Number 4    Number of Visits 16    Date for OT Re-Evaluation 11/06/21   mini-reassessment 10/05/2021   Authorization Type 1) BCBS Commercial 2) UHC Medicaid    Authorization Time Period UHC Medicaid: No auth required at this time, 27 visits per year combined PT/OT/SP    Authorization - Visit Number 3    Authorization - Number of Visits 27    OT Start Time 1515    OT Stop Time 1553    OT Time Calculation (min) 38 min    Activity Tolerance Patient tolerated treatment well    Behavior During Therapy WFL for tasks assessed/performed              Past Medical History:  Diagnosis Date   Anemia    Arthritis    Blood dyscrasia    sickle cell trait   Cancer ([place])    lymphoma   Carbuncle and furuncle    Hypercholesterolemia    Hypertension    does not take meds   Papanicolaou smear of cervix with positive high risk human papilloma virus (HPV) test 08/23/2020   08/23/20    [place]po per ASCCP guidelines, immediate risk of CIN 3+ is 4.1 %   Sickle cell trait ([place])    Past Surgical History:  Procedure Laterality Date   APPENDECTOMY     APPLICATION OF CRANIAL NAVIGATION N/A 07/28/2018   Procedure: APPLICATION OF CRANIAL NAVIGATION;  Surgeon: Kary Kos, MD;  Location: [place];  Service: Neurosurgery;  Laterality: N/A;   ARTHOSCOPIC ROTAOR CUFF REPAIR Right 08/24/2021   Procedure: ARTHROSCOPIC ROTATOR CUFF REPAIR;  Surgeon: Mordecai Rasmussen, MD;  [facility];  Service: Orthopedics;  Laterality: Right;   BRAIN SURGERY  2020   CARPAL TUNNEL RELEASE  03/23/2011   Procedure: CARPAL TUNNEL RELEASE;  Surgeon: Sanjuana Kava;  [facility];  Service: Orthopedics;  Laterality: Left;   CARPAL TUNNEL RELEASE   05/10/2011   Procedure: CARPAL TUNNEL RELEASE;  Surgeon: Sanjuana Kava, MD;  [facility];  Service: Orthopedics;  Laterality: Right;   CESAREAN SECTION     x 2   COLONOSCOPY WITH PROPOFOL N/A 11/16/2020   Procedure: COLONOSCOPY WITH PROPOFOL;  Surgeon: Harvel Quale, MD;  [facility];  Service: Gastroenterology;  Laterality: N/A;  9:15   INCISION AND DRAINAGE PERIRECTAL ABSCESS  12/21/2009   LUMBAR LAMINECTOMY/DECOMPRESSION MICRODISCECTOMY Left 12/12/2020   Procedure: Laminectomy and Foraminotomy - left - L5-S1;  Surgeon: Kary Kos, MD;  Location: [place];  Service: Neurosurgery;  Laterality: Left;  3C   PR DURAL GRAFT REPAIR,SPINE DEFECT N/A 07/28/2018   Procedure: Stereotactic open biopsy of Right cerebellar hemisphere and dura with brainlab;  Surgeon: Kary Kos, MD;  Location: [place];  Service: Neurosurgery;  Laterality: N/A;  Stereotactic open biopsy of Right cerebellar hemisphere and dura with brainlab   PROCTOSCOPY  10/17/2010   Procedure: PROCTOSCOPY;  Surgeon: Scherry Ran;  [facility];  Service: General;  Laterality: N/A;  Rigid Proctoscopy/Possible Fistula in Ano  Procedure ended at [place] Right 08/24/2021   Procedure: SHOULDER ARTHROSCOPY WITH BICEPSTENOTOMY;  Surgeon: Mordecai Rasmussen, MD;  [facility];  Service: Orthopedics;  Laterality: Right;   THERAPEUTIC ABORTION     x2   Patient Active Problem List   Diagnosis Date Noted   Hidradenitis suppurativa 08/18/2021   Spinal stenosis of lumbar region 12/12/2020   Papanicolaou smear of cervix with positive high risk human papilloma virus (HPV) test 08/23/2020   Hemorrhoids 08/16/2020   Encounter for screening fecal occult blood testing 08/16/2020   Encounter for gynecological examination with Papanicolaou smear of cervix 08/16/2020   Allergic reaction to adhesive 08/16/2020   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   Counseling  regarding advance care planning and goals of care 10/23/2018   Diffuse large B cell lymphoma ([place]) 08/11/2018   Brain tumor ([place]) 07/28/2018   ANA positive 03/24/2018   Carpal tunnel syndrome on both sides 03/24/2018   Chronic pain of both shoulders 03/24/2018   Hidradenitis 07/31/2016   Recurrent boils 07/31/2016   Hyperlipidemia 06/15/2015   Pain of left hand 05/19/2015   Hand pain, right 05/19/2015   Hematuria 03/03/2015   Esophageal reflux 03/03/2015   Essential hypertension, benign 03/03/2015   Bronchitis due to tobacco use 10/17/2010    ONSET DATE: 08/24/21  REFERRING DIAG: s/p right RCR  THERAPY DIAG:  Stiffness of right shoulder, not elsewhere classified  Acute pain of right shoulder  Other symptoms and signs involving the musculoskeletal system  Rationale for Evaluation and Treatment Rehabilitation  PERTINENT HISTORY: Pt is a 61 y/o female s/p right RCR on 08/24/21, presenting with RUE functional deficits limiting ability to perform ADLs using RUE as dominant. Presents in sling, pt was referred to occupational therapy for evaluation and treatment by Dr. Larena Glassman.   PRECAUTIONS: see protocol   WEIGHT BEARING RESTRICTIONS Yes NWB  SUBJECTIVE: S: I've been moving it a little bit   PAIN:  Are you having pain? Yes: NPRS scale: 7 /10 Pain location: Incision site Pain description: Sore, sharp Aggravating factors: movement  Relieving factors: rest, ice  Pt report that the pain is worse at night, occurring at the anterior shoulder and shooting down into D1-3    PATIENT GOALS To be able to use her right arm during daily tasks.  OBJECTIVE:   HAND DOMINANCE: Right  FUNCTIONAL OUTCOME MEASURES: FOTO: 4/100   UPPER EXTREMITY ROM        Assessed seated, er/IR adducted Passive ROM Right eval  Shoulder flexion 62  Shoulder abduction 45  Shoulder internal rotation 90  Shoulder external rotation 51  (Blank rows = not tested)     UPPER EXTREMITY MMT:       Unable to assess due to precautions MMT Right eval  Shoulder flexion    Shoulder abduction    Shoulder internal rotation    Shoulder external rotation    (Blank rows = not tested)       PATIENT EDUCATION: Education details: Pain management (alternating between heat and ice) Person educated: Patient Education method: Explanation, Demonstration Education comprehension: verbalized understanding      HOME EXERCISE PROGRAM: Eval: table slides 6/20: Elbow AROM   GOALS: Goals reviewed with patient? Yes   SHORT TERM GOALS: Target date: 10/05/2021     Pt will be provided with HEP to improve mobility of RUE required for ADL completion.    Goal status: Ongoing   2.  Pt will increase RUE P/ROM to [facility] to improve ability to perform dressing tasks with minimal compensatory strategies and techniques.    Goal status: Ongoing   3.  Pt will increase RUE strength to 3/5  or greater to improve ability to reach for items at waist to chest height during functional tasks.    Goal status: Ongoing       LONG TERM GOALS: Target date: 11/02/2021     Pt will return to highest level of functioning using RUE as dominant during ADL and work tasks.    Goal status: Ongoing   2.  Pt will decrease pain in RUE to 3/10 or less to improve ability to use RUE as dominant during housework tasks.    Goal status: Ongoing   3.  Pt will decrease RUE fascial restrictions to min amounts or less to improve mobility required for functional reaching tasks at home and work.    Goal status: Ongoing   4.  Pt will increase RUE A/ROM to [facility] to improve ability to reaching overhead and behind back during dressing and bathing tasks.    Goal status: Ongoing   5.  Pt will increase RUE strength to 4+/5 or greater to improve ability to perform lifting tasks at work.    Goal status: Ongoing    TODAY'S TREATMENT:  10/05/21 -Manual  -Seated P/ROM: 1x10 shoulder flexion, 1x4 shoulder abduction, 1x10 IR/er -Scapular  ROM: 1x10 elevation/depression and row -Isometrics: 5x5" shoulder extension -Grip strengthening: red theraputty, 10x10" fist squeeze   09/29/21 -Seated PROM: 1x10 shoulder flexion, abduction, IR/er -Seated AA/ROM: 1x10 shoulder flexion  -Scapular ROM: 1x10 seated row and elevation/depression   09/19/21 -P/ROM: Pt in supine, RUE supported by pillow; shoulder flexion, shoulder abduction, IR/er, 10 reps -Isometrics: 4x3" shoulder flexion, extension, and abduction -Table slides: seated forward flexion at table and standing forward flexion at railing 1x10 reps each   ASSESSMENT:   CLINICAL IMPRESSION: A: Pt reporting increased pain at the start of the session. Completed manual therapy to decrease restrictions prior to passive stretching. Most restrictions found at upper trapezius and length of biceps. Progress to seated passive stretching with pt able to tolerate forward flexion with no increased pain. Pt unable to tolerate shoulder abduction, reporting pain "like electricity" starting at the scalenes and traveling to mid bicep. Therapist providing constant verbal cues for deep breathing throughout passive stretching with pt guarding. She was unable to tolerate shoulder isometrics with slight resistance in flexion and abduction. Completed grip strengthening with no increased pain. Therapy progress has been limited by pt's high reports of pain the past several sessions. Pt has a follow up with surgeon on 10/10/21, discussed having a conversation about pain levels.    PLAN:   OT FREQUENCY: 2x/week  OT DURATION: 8 weeks  PLANNED INTERVENTIONS: self care/ADL training, therapeutic exercise, therapeutic activity, manual therapy, passive range of motion, splinting, electrical stimulation, ultrasound, moist heat, patient/family education, and DME and/or AE instructions  RECOMMENDED OTHER SERVICES: Pt will benefit from skilled OT services to decrease pain and fascial restrictions, increase joint ROM,  strength, and functional use of RUE as dominant.  CONSULTED AND AGREED WITH PLAN OF CARE: Patient  PLAN FOR NEXT SESSION: P: Follow up on pain and discuss scheduling an appointment with physician. Grip strengthening. Continue with P/ROM seated to prevent extension. [place], OTD, OTR/L [phone]  10/05/2021, 3:59 PM

## 2021-10-06 ENCOUNTER — Encounter (HOSPITAL_COMMUNITY): Payer: BC Managed Care – PPO

## 2021-10-10 ENCOUNTER — Inpatient Hospital Stay: Payer: BC Managed Care – PPO

## 2021-10-10 ENCOUNTER — Ambulatory Visit: Payer: BC Managed Care – PPO

## 2021-10-10 ENCOUNTER — Encounter: Payer: Self-pay | Admitting: Orthopedic Surgery

## 2021-10-10 ENCOUNTER — Inpatient Hospital Stay: Payer: BC Managed Care – PPO | Admitting: Hematology

## 2021-10-10 ENCOUNTER — Ambulatory Visit (INDEPENDENT_AMBULATORY_CARE_PROVIDER_SITE_OTHER): Payer: BC Managed Care – PPO | Admitting: Orthopedic Surgery

## 2021-10-10 DIAGNOSIS — M75111 Incomplete rotator cuff tear or rupture of right shoulder, not specified as traumatic: Secondary | ICD-10-CM

## 2021-10-10 DIAGNOSIS — M25511 Pain in right shoulder: Secondary | ICD-10-CM | POA: Diagnosis not present

## 2021-10-10 DIAGNOSIS — F1120 Opioid dependence, uncomplicated: Secondary | ICD-10-CM | POA: Diagnosis not present

## 2021-10-10 DIAGNOSIS — M5416 Radiculopathy, lumbar region: Secondary | ICD-10-CM | POA: Diagnosis not present

## 2021-10-10 DIAGNOSIS — M48061 Spinal stenosis, lumbar region without neurogenic claudication: Secondary | ICD-10-CM | POA: Diagnosis not present

## 2021-10-10 NOTE — Progress Notes (Signed)
Orthopaedic Postop Note  Assessment: Amber Drake is a 61 y.o. female s/p right shoulder arthroscopy, with rotator cuff repair and biceps tenotomy  DOS: 08/24/21  Plan: Overall, she is doing well following surgery. He is working well with therapy. Forward flexion beyond 90 degrees.  She tolerates abduction greater than 90 degrees Continue with PT per the protocol. Medications as needed. Reiterated that it is up to 6 months before full return of function.  Follow-up: No follow-ups on file. XR at next visit: None  Subjective:  Chief Complaint  Patient presents with   Post-op Follow-up    Shoulder right / states pain now in right arm into neck     History of Present Illness: Amber Drake is a 61 y.o. female who presents following the above stated procedure.  Surgery was approximately 6 weeks ago.  Her pain is improving.  She is working with physical therapy.  She has noticed improvements in her range of motion.  She does have some pain into the right side of the neck.  No numbness or tingling.  No issues with her surgical incisions.  Review of Systems: No fevers or chills No numbness or tingling No Chest Pain No shortness of breath   Objective: LMP 07/18/2010   Physical Exam:  Alert and oriented.  No acute distress.  Surgical incisions are healing well.  No surrounding erythema or drainage.  Sensation is intact in the axillary nerve distribution.  Fingers are warm and well-perfused.  Active motion intact in the right hand/PIN/U nerve distribution.  Passive forward flexion beyond 120 degrees.  Abduction beyond 90 degrees at her side.  No pain with minimal active motion.  IMAGING: I personally ordered and reviewed the following images:  No new Imaging obtained today.   Mordecai Rasmussen, MD 10/10/2021 2:12 PM

## 2021-10-11 ENCOUNTER — Encounter (HOSPITAL_COMMUNITY): Payer: BC Managed Care – PPO

## 2021-10-11 ENCOUNTER — Other Ambulatory Visit (HOSPITAL_COMMUNITY): Payer: Self-pay | Admitting: Adult Health

## 2021-10-11 DIAGNOSIS — Z1231 Encounter for screening mammogram for malignant neoplasm of breast: Secondary | ICD-10-CM

## 2021-10-12 ENCOUNTER — Encounter (HOSPITAL_COMMUNITY): Payer: Self-pay

## 2021-10-12 ENCOUNTER — Ambulatory Visit (HOSPITAL_COMMUNITY): Payer: BC Managed Care – PPO

## 2021-10-12 DIAGNOSIS — R29898 Other symptoms and signs involving the musculoskeletal system: Secondary | ICD-10-CM | POA: Diagnosis not present

## 2021-10-12 DIAGNOSIS — M25611 Stiffness of right shoulder, not elsewhere classified: Secondary | ICD-10-CM

## 2021-10-12 DIAGNOSIS — M25511 Pain in right shoulder: Secondary | ICD-10-CM | POA: Diagnosis not present

## 2021-10-12 NOTE — Therapy (Signed)
OUTPATIENT OCCUPATIONAL THERAPY TREATMENT NOTE   Patient Name: Amber Drake MRN: [id number] DOB:29-Jul-1960, 61 y.o., female Today's Date: 10/12/2021  PCP: Dr. [name] REFERRING PROVIDER: Dr. Larena Glassman  END OF SESSION:   OT End of Session - 10/12/21 0950     Visit Number 5    Number of Visits 16    Date for OT Re-Evaluation 11/06/21   mini-reassessment 10/05/2021   Authorization Type 1) BCBS Commercial 2) UHC Medicaid    Authorization Time Period UHC Medicaid: No auth required at this time, 27 visits per year combined PT/OT/SP    Authorization - Visit Number 4    Authorization - Number of Visits 27    OT Start Time [phone]    OT Stop Time 1026    OT Time Calculation (min) 39 min    Activity Tolerance Patient tolerated treatment well    Behavior During Therapy WFL for tasks assessed/performed              Past Medical History:  Diagnosis Date   Anemia    Arthritis    Blood dyscrasia    sickle cell trait   Cancer ([place])    lymphoma   Carbuncle and furuncle    Hypercholesterolemia    Hypertension    does not take meds   Papanicolaou smear of cervix with positive high risk human papilloma virus (HPV) test 08/23/2020   08/23/20    [place] per ASCCP guidelines, immediate risk of CIN 3+ is 4.1 %   Sickle cell trait ([place])    Past Surgical History:  Procedure Laterality Date   APPENDECTOMY     APPLICATION OF CRANIAL NAVIGATION N/A 07/28/2018   Procedure: APPLICATION OF CRANIAL NAVIGATION;  Surgeon: Kary Kos, MD;  Location: [place];  Service: Neurosurgery;  Laterality: N/A;   ARTHOSCOPIC ROTAOR CUFF REPAIR Right 08/24/2021   Procedure: ARTHROSCOPIC ROTATOR CUFF REPAIR;  Surgeon: Mordecai Rasmussen, MD;  [facility];  Service: Orthopedics;  Laterality: Right;   BRAIN SURGERY  2020   CARPAL TUNNEL RELEASE  03/23/2011   Procedure: CARPAL TUNNEL RELEASE;  Surgeon: Sanjuana Kava;  [facility];  Service: Orthopedics;  Laterality: Left;   CARPAL TUNNEL RELEASE   05/10/2011   Procedure: CARPAL TUNNEL RELEASE;  Surgeon: Sanjuana Kava, MD;  [facility];  Service: Orthopedics;  Laterality: Right;   CESAREAN SECTION     x 2   COLONOSCOPY WITH PROPOFOL N/A 11/16/2020   Procedure: COLONOSCOPY WITH PROPOFOL;  Surgeon: Harvel Quale, MD;  [facility];  Service: Gastroenterology;  Laterality: N/A;  9:15   INCISION AND DRAINAGE PERIRECTAL ABSCESS  12/21/2009   LUMBAR LAMINECTOMY/DECOMPRESSION MICRODISCECTOMY Left 12/12/2020   Procedure: Laminectomy and Foraminotomy - left - L5-S1;  Surgeon: Kary Kos, MD;  Location: [place];  Service: Neurosurgery;  Laterality: Left;  3C   PR DURAL GRAFT REPAIR,SPINE DEFECT N/A 07/28/2018   Procedure: Stereotactic open biopsy of Right cerebellar hemisphere and dura with brainlab;  Surgeon: Kary Kos, MD;  Location: [place];  Service: Neurosurgery;  Laterality: N/A;  Stereotactic open biopsy of Right cerebellar hemisphere and dura with brainlab   PROCTOSCOPY  10/17/2010   Procedure: PROCTOSCOPY;  Surgeon: Scherry Ran;  [facility];  Service: General;  Laterality: N/A;  Rigid Proctoscopy/Possible Fistula in Ano  Procedure ended at [place] Right 08/24/2021   Procedure: SHOULDER ARTHROSCOPY WITH BICEPSTENOTOMY;  Surgeon: Mordecai Rasmussen, MD;  [facility];  Service: Orthopedics;  Laterality: Right;   THERAPEUTIC ABORTION     x2   Patient Active Problem List   Diagnosis Date Noted   Hidradenitis suppurativa 08/18/2021   Spinal stenosis of lumbar region 12/12/2020   Papanicolaou smear of cervix with positive high risk human papilloma virus (HPV) test 08/23/2020   Hemorrhoids 08/16/2020   Encounter for screening fecal occult blood testing 08/16/2020   Encounter for gynecological examination with Papanicolaou smear of cervix 08/16/2020   Allergic reaction to adhesive 08/16/2020   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   Counseling  regarding advance care planning and goals of care 10/23/2018   Diffuse large B cell lymphoma ([place]) 08/11/2018   Brain tumor ([place]) 07/28/2018   ANA positive 03/24/2018   Carpal tunnel syndrome on both sides 03/24/2018   Chronic pain of both shoulders 03/24/2018   Hidradenitis 07/31/2016   Recurrent boils 07/31/2016   Hyperlipidemia 06/15/2015   Pain of left hand 05/19/2015   Hand pain, right 05/19/2015   Hematuria 03/03/2015   Esophageal reflux 03/03/2015   Essential hypertension, benign 03/03/2015   Bronchitis due to tobacco use 10/17/2010    ONSET DATE: 08/24/21  REFERRING DIAG: s/p right RCR  THERAPY DIAG:  Stiffness of right shoulder, not elsewhere classified  Acute pain of right shoulder  Other symptoms and signs involving the musculoskeletal system  Rationale for Evaluation and Treatment Rehabilitation  PERTINENT HISTORY: Pt is a 61 y/o female s/p right RCR on 08/24/21, presenting with RUE functional deficits limiting ability to perform ADLs using RUE as dominant. Presents in sling, pt was referred to occupational therapy for evaluation and treatment by Dr. Larena Glassman.   PRECAUTIONS: see protocol   WEIGHT BEARING RESTRICTIONS Yes NWB  SUBJECTIVE: S: Pt reports that her recent appointment well and that she is now taking gaba for the pain. "I feel like it is a little better. It just hurts when I try to do things quickly. "   PAIN:  Are you having pain? Yes: NPRS scale: 6/10 Pain location: Incision site Pain description: Sore, sharp Aggravating factors: movement  Relieving factors: rest, ice  Pain is worse at night     PATIENT GOALS To be able to use her right arm during daily tasks.  OBJECTIVE:   HAND DOMINANCE: Right  FUNCTIONAL OUTCOME MEASURES: FOTO: 4/100   UPPER EXTREMITY ROM        Assessed seated, er/IR adducted Passive ROM Right eval Right 10/12/21  Shoulder flexion 62 155  Shoulder abduction 45 120  Shoulder internal rotation 90 90   Shoulder external rotation 51 45  (Blank rows = not tested)  Active ROM Right eval Right 10/12/21  Shoulder flexion  120  Shoulder abduction   73  Shoulder internal rotation  90  Shoulder external rotation  45       UPPER EXTREMITY MMT:      Unable to assess due to precautions MMT Right eval Right  10/12/21  Shoulder flexion   3/5  Shoulder abduction   3/5  Shoulder internal rotation   3/5  Shoulder external rotation   3/5  (Blank rows = not tested)       PATIENT EDUCATION: Education details: shoulder AA/ROM Person educated: Patient Education method: Consulting civil engineer, Demonstration, handout  Education comprehension: verbalized understanding, returned demonstration      HOME EXERCISE PROGRAM: Eval: table slides 6/20: Elbow AROM 7/13: Shoulder AA/ROM   GOALS: Goals reviewed with patient? Yes   SHORT TERM GOALS: Target date: 10/05/2021  Pt will be provided with HEP to improve mobility of RUE required for ADL completion.    Goal status: Ongoing   2.  Pt will increase RUE P/ROM to [facility] to improve ability to perform dressing tasks with minimal compensatory strategies and techniques.    Goal status: Ongoing   3.  Pt will increase RUE strength to 3/5 or greater to improve ability to reach for items at waist to chest height during functional tasks.    Goal status: MET       LONG TERM GOALS: Target date: 11/02/2021     Pt will return to highest level of functioning using RUE as dominant during ADL and work tasks.    Goal status: Ongoing   2.  Pt will decrease pain in RUE to 3/10 or less to improve ability to use RUE as dominant during housework tasks.    Goal status: Ongoing   3.  Pt will decrease RUE fascial restrictions to min amounts or less to improve mobility required for functional reaching tasks at home and work.    Goal status: Ongoing   4.  Pt will increase RUE A/ROM to [facility] to improve ability to reaching overhead and behind back during dressing and  bathing tasks.    Goal status: Ongoing   5.  Pt will increase RUE strength to 4+/5 or greater to improve ability to perform lifting tasks at work.    Goal status: Ongoing    TODAY'S TREATMENT:  10/12/21 -Seated P/ROM: 1x5 shoulder flexion, abduction, IR/er -AA/ROM: 1x10 shoulder forward flexion, abduction, protraction, IR/er -Manual therapy: soft tissue mobilization completed to deltoids and length of biceps -Isometric: 5x5" shoulder extension, flexion, abduction   10/05/21 -Manual  -Seated P/ROM: 1x10 shoulder flexion, 1x4 shoulder abduction, 1x10 IR/er -Scapular ROM: 1x10 elevation/depression and row -Isometrics: 5x5" shoulder extension -Grip strengthening: red theraputty, 10x10" fist squeeze   09/29/21 -Seated PROM: 1x10 shoulder flexion, abduction, IR/er -Seated AA/ROM: 1x10 shoulder flexion  -Scapular ROM: 1x10 seated row and elevation/depression     ASSESSMENT:   CLINICAL IMPRESSION: A: Pt reporting decreased pain at start of session and that her recent doctor appointment went well. She was able to tolerate increased passive stretching today, although continues with some muscle spasms at deltoids and biceps with shoulder abduction. Addressed spasms with manual therapy and pt noting relief, able to progress to AA/ROM. Therapist providing demonstration and moderate tactile cues throughout to ensure proper form. Pt somewhat limited by pain and fascial restrictions, reiterated the importance of completing exercises at home for carryover. Discussed protocol and healing timeline. Completed isometrics with pt instructed to apply resistance within pain free range, able to apply minimal resistance with abduction and flexion.   Tolerated minimal resistance with isometrics    PLAN:   OT FREQUENCY: 2x/week  OT DURATION: 8 weeks  PLANNED INTERVENTIONS: self care/ADL training, therapeutic exercise, therapeutic activity, manual therapy, passive range of motion, splinting, electrical  stimulation, ultrasound, moist heat, patient/family education, and DME and/or AE instructions  RECOMMENDED OTHER SERVICES: Pt will benefit from skilled OT services to decrease pain and fascial restrictions, increase joint ROM, strength, and functional use of RUE as dominant.  CONSULTED AND AGREED WITH PLAN OF CARE: Patient  PLAN FOR NEXT SESSION: P: Grip strengthening, pulleys.           Flonnie Hailstone, [place], OTR/L [phone]  10/12/2021, 10:31 AM

## 2021-10-12 NOTE — Patient Instructions (Addendum)
Perform each exercise ____10-15____ reps. 2-3x days.   1) Protraction   Start by holding a wand or cane at chest height.  Next, slowly push the wand outwards in front of your body so that your elbows become fully straightened. Then, return to the original position.     2) Shoulder FLEXION   In the standing position, hold a wand/cane with both arms, palms down on both sides. Raise up the wand/cane allowing your unaffected arm to perform most of the effort. Your affected arm should be partially relaxed.      3) Internal/External ROTATION   In the standing position, hold a wand/cane with both hands keeping your elbows bent. Move your arms and wand/cane to one side.  Your affected arm should be partially relaxed while your unaffected arm performs most of the effort.       4) Shoulder ABDUCTION   While holding a wand/cane palm face up on the injured side and palm face down on the uninjured side, slowly raise up your injured arm to the side.                

## 2021-10-13 ENCOUNTER — Telehealth (HOSPITAL_COMMUNITY): Payer: Self-pay

## 2021-10-13 ENCOUNTER — Encounter (HOSPITAL_COMMUNITY): Payer: BC Managed Care – PPO

## 2021-10-13 NOTE — Telephone Encounter (Signed)
Called pt regarding no show apt. Her dog passed away last night and she is unable to reschedule for later today.

## 2021-10-16 ENCOUNTER — Ambulatory Visit (HOSPITAL_COMMUNITY): Payer: BC Managed Care – PPO

## 2021-10-18 ENCOUNTER — Ambulatory Visit (HOSPITAL_COMMUNITY): Payer: BC Managed Care – PPO | Admitting: Occupational Therapy

## 2021-10-18 ENCOUNTER — Encounter (HOSPITAL_COMMUNITY): Payer: Self-pay | Admitting: Occupational Therapy

## 2021-10-18 DIAGNOSIS — M25511 Pain in right shoulder: Secondary | ICD-10-CM | POA: Diagnosis not present

## 2021-10-18 DIAGNOSIS — R29898 Other symptoms and signs involving the musculoskeletal system: Secondary | ICD-10-CM | POA: Diagnosis not present

## 2021-10-18 DIAGNOSIS — M25611 Stiffness of right shoulder, not elsewhere classified: Secondary | ICD-10-CM

## 2021-10-18 NOTE — Therapy (Signed)
OUTPATIENT OCCUPATIONAL THERAPY TREATMENT NOTE   Patient Name: Amber Drake MRN: [id number] DOB:10/24/1960, 61 y.o., female Today's Date: 10/18/2021  PCP: Dr. [name] REFERRING PROVIDER: Dr. Larena Glassman  END OF SESSION:   OT End of Session - 10/18/21 1027     Visit Number 6    Number of Visits 16    Date for OT Re-Evaluation 11/06/21   mini-reassessment 10/05/2021   Authorization Type 1) BCBS Commercial 2) UHC Medicaid    Authorization Time Period UHC Medicaid: No auth required at this time, 27 visits per year combined PT/OT/SP    Authorization - Visit Number 5    Authorization - Number of Visits 27    OT Start Time [phone]    OT Stop Time 1026    OT Time Calculation (min) 38 min    Activity Tolerance Patient tolerated treatment well    Behavior During Therapy WFL for tasks assessed/performed               Past Medical History:  Diagnosis Date   Anemia    Arthritis    Blood dyscrasia    sickle cell trait   Cancer ([place])    lymphoma   Carbuncle and furuncle    Hypercholesterolemia    Hypertension    does not take meds   Papanicolaou smear of cervix with positive high risk human papilloma virus (HPV) test 08/23/2020   08/23/20    [place] per ASCCP guidelines, immediate risk of CIN 3+ is 4.1 %   Sickle cell trait ([place])    Past Surgical History:  Procedure Laterality Date   APPENDECTOMY     APPLICATION OF CRANIAL NAVIGATION N/A 07/28/2018   Procedure: APPLICATION OF CRANIAL NAVIGATION;  Surgeon: Kary Kos, MD;  Location: [place];  Service: Neurosurgery;  Laterality: N/A;   ARTHOSCOPIC ROTAOR CUFF REPAIR Right 08/24/2021   Procedure: ARTHROSCOPIC ROTATOR CUFF REPAIR;  Surgeon: Mordecai Rasmussen, MD;  [facility];  Service: Orthopedics;  Laterality: Right;   BRAIN SURGERY  2020   CARPAL TUNNEL RELEASE  03/23/2011   Procedure: CARPAL TUNNEL RELEASE;  Surgeon: Sanjuana Kava;  [facility];  Service: Orthopedics;  Laterality: Left;   CARPAL TUNNEL RELEASE   05/10/2011   Procedure: CARPAL TUNNEL RELEASE;  Surgeon: Sanjuana Kava, MD;  [facility];  Service: Orthopedics;  Laterality: Right;   CESAREAN SECTION     x 2   COLONOSCOPY WITH PROPOFOL N/A 11/16/2020   Procedure: COLONOSCOPY WITH PROPOFOL;  Surgeon: Harvel Quale, MD;  [facility];  Service: Gastroenterology;  Laterality: N/A;  9:15   INCISION AND DRAINAGE PERIRECTAL ABSCESS  12/21/2009   LUMBAR LAMINECTOMY/DECOMPRESSION MICRODISCECTOMY Left 12/12/2020   Procedure: Laminectomy and Foraminotomy - left - L5-S1;  Surgeon: Kary Kos, MD;  Location: [place];  Service: Neurosurgery;  Laterality: Left;  3C   PR DURAL GRAFT REPAIR,SPINE DEFECT N/A 07/28/2018   Procedure: Stereotactic open biopsy of Right cerebellar hemisphere and dura with brainlab;  Surgeon: Kary Kos, MD;  Location: [place];  Service: Neurosurgery;  Laterality: N/A;  Stereotactic open biopsy of Right cerebellar hemisphere and dura with brainlab   PROCTOSCOPY  10/17/2010   Procedure: PROCTOSCOPY;  Surgeon: Scherry Ran;  [facility];  Service: General;  Laterality: N/A;  Rigid Proctoscopy/Possible Fistula in Ano  Procedure ended at [place] Right 08/24/2021   Procedure: SHOULDER ARTHROSCOPY WITH BICEPSTENOTOMY;  Surgeon: Mordecai Rasmussen, MD;  Location[facility];  Service: Orthopedics;  Laterality: Right;   THERAPEUTIC ABORTION     x2   Patient Active Problem List   Diagnosis Date Noted   Hidradenitis suppurativa 08/18/2021   Spinal stenosis of lumbar region 12/12/2020   Papanicolaou smear of cervix with positive high risk human papilloma virus (HPV) test 08/23/2020   Hemorrhoids 08/16/2020   Encounter for screening fecal occult blood testing 08/16/2020   Encounter for gynecological examination with Papanicolaou smear of cervix 08/16/2020   Allergic reaction to adhesive 08/16/2020   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   Counseling  regarding advance care planning and goals of care 10/23/2018   Diffuse large B cell lymphoma ([place]) 08/11/2018   Brain tumor ([place]) 07/28/2018   ANA positive 03/24/2018   Carpal tunnel syndrome on both sides 03/24/2018   Chronic pain of both shoulders 03/24/2018   Hidradenitis 07/31/2016   Recurrent boils 07/31/2016   Hyperlipidemia 06/15/2015   Pain of left hand 05/19/2015   Hand pain, right 05/19/2015   Hematuria 03/03/2015   Esophageal reflux 03/03/2015   Essential hypertension, benign 03/03/2015   Bronchitis due to tobacco use 10/17/2010    ONSET DATE: 08/24/21  REFERRING DIAG: s/p right RCR  THERAPY DIAG:  Stiffness of right shoulder, not elsewhere classified  Acute pain of right shoulder  Other symptoms and signs involving the musculoskeletal system  Rationale for Evaluation and Treatment Rehabilitation  PERTINENT HISTORY: Pt is a 62 y/o female s/p right RCR on 08/24/21, presenting with RUE functional deficits limiting ability to perform ADLs using RUE as dominant. Presents in sling, pt was referred to occupational therapy for evaluation and treatment by Dr. Larena Glassman.   PRECAUTIONS: see protocol   WEIGHT BEARING RESTRICTIONS Yes NWB  SUBJECTIVE: S: The pain has moved from the back to the top of the shoulder.    PAIN:  Are you having pain? Yes: NPRS scale: 8/10 Pain location: Incision site Pain description: Sore, sharp Aggravating factors: movement  Relieving factors: rest, ice  Pain is worse at night     PATIENT GOALS To be able to use her right arm during daily tasks.  OBJECTIVE:   HAND DOMINANCE: Right  FUNCTIONAL OUTCOME MEASURES: FOTO: 4/100   UPPER EXTREMITY ROM        Assessed seated, er/IR adducted Passive ROM Right eval Right 10/12/21  Shoulder flexion 62 155  Shoulder abduction 45 120  Shoulder internal rotation 90 90  Shoulder external rotation 51 45  (Blank rows = not tested)  Active ROM Right eval Right 10/12/21  Shoulder  flexion  120  Shoulder abduction   73  Shoulder internal rotation  90  Shoulder external rotation  45       UPPER EXTREMITY MMT:      Unable to assess due to precautions MMT Right eval Right  10/12/21  Shoulder flexion   3/5  Shoulder abduction   3/5  Shoulder internal rotation   3/5  Shoulder external rotation   3/5  (Blank rows = not tested)          GOALS: Goals reviewed with patient? Yes   SHORT TERM GOALS: Target date: 10/05/2021     Pt will be provided with HEP to improve mobility of RUE required for ADL completion.    Goal status: Ongoing   2.  Pt will increase RUE P/ROM to [facility] to improve ability to perform dressing tasks with minimal compensatory strategies and techniques.    Goal status: Ongoing   3.  Pt will increase RUE  strength to 3/5 or greater to improve ability to reach for items at waist to chest height during functional tasks.    Goal status: MET       LONG TERM GOALS: Target date: 11/02/2021     Pt will return to highest level of functioning using RUE as dominant during ADL and work tasks.    Goal status: Ongoing   2.  Pt will decrease pain in RUE to 3/10 or less to improve ability to use RUE as dominant during housework tasks.    Goal status: Ongoing   3.  Pt will decrease RUE fascial restrictions to min amounts or less to improve mobility required for functional reaching tasks at home and work.    Goal status: Ongoing   4.  Pt will increase RUE A/ROM to [facility] to improve ability to reaching overhead and behind back during dressing and bathing tasks.    Goal status: Ongoing   5.  Pt will increase RUE strength to 4+/5 or greater to improve ability to perform lifting tasks at work.    Goal status: Ongoing    TODAY'S TREATMENT: 10/18/21 -Manual therapy: soft tissue mobilization and myofascial release to right bicep, deltoid, and trapezius regions to address pain and fascial restrictions and increase joint ROM.  -P/ROM: supine, shoulder  flexion, abduction, er/IR, horizontal abduction, 5 reps each -AA/ROM: supine, shoulder protraction, flexion, horizontal abduction, 10 reps each -A/ROM: supine, standing er/IR, 10 reps -AA/ROM: standing, abduction, protraction, flexion, horizontal abduction, 10 reps -Wall wash: 1' flexion -Caudle glide: 2x10" seated on mat table, grasping edge of table and leaning away from RUE  10/12/21 -Seated P/ROM: 1x5 shoulder flexion, abduction, IR/er -AA/ROM: 1x10 shoulder forward flexion, abduction, protraction, IR/er -Manual therapy: soft tissue mobilization completed to deltoids and length of biceps -Isometric: 5x5" shoulder extension, flexion, abduction   10/05/21 -Manual  -Seated P/ROM: 1x10 shoulder flexion, 1x4 shoulder abduction, 1x10 IR/er -Scapular ROM: 1x10 elevation/depression and row -Isometrics: 5x5" shoulder extension -Grip strengthening: red theraputty, 10x10" fist squeez   PATIENT EDUCATION: Education details: continue with current HEP Person educated: Patient Education method: Consulting civil engineer, Demonstration, handout  Education comprehension: verbalized understanding, returned demonstration      HOME EXERCISE PROGRAM: Eval: table slides 6/20: Elbow AROM 7/13: Shoulder AA/ROM   ASSESSMENT:   CLINICAL IMPRESSION: A: Continued with manual techniques, trigger point release to upper trapezius with good response. Pt able to tolerate supine P/ROM, increased range with cuing for relaxing and focusing on breathing techniques, achieving ROM approximately 75%. Pt completing AA/ROM both supine and standing, unable to complete abduction supine however was able to complete in standing with ROM at approximately 50%. Added wall wash, completed caudle glide immediately after to decrease muscle spasm. Verbal cuing for form and technique during session.      PLAN:   OT FREQUENCY: 2x/week  OT DURATION: 8 weeks  PLANNED INTERVENTIONS: self care/ADL training, therapeutic exercise,  therapeutic activity, manual therapy, passive range of motion, splinting, electrical stimulation, ultrasound, moist heat, patient/family education, and DME and/or AE instructions  RECOMMENDED OTHER SERVICES: Pt will benefit from skilled OT services to decrease pain and fascial restrictions, increase joint ROM, strength, and functional use of RUE as dominant.  CONSULTED AND AGREED WITH PLAN OF CARE: Patient  PLAN FOR NEXT SESSION: P: follow up on HEP, continue with AA/ROM, add proximal shoulder strengthening in supine and attempt standing          Guadelupe Sabin, OTR/L  [phone] 10/18/2021, 10:28 AM

## 2021-10-20 ENCOUNTER — Encounter (HOSPITAL_COMMUNITY): Payer: Self-pay | Admitting: Occupational Therapy

## 2021-10-20 ENCOUNTER — Ambulatory Visit (HOSPITAL_COMMUNITY): Payer: BC Managed Care – PPO | Admitting: Occupational Therapy

## 2021-10-20 DIAGNOSIS — R29898 Other symptoms and signs involving the musculoskeletal system: Secondary | ICD-10-CM | POA: Diagnosis not present

## 2021-10-20 DIAGNOSIS — M25511 Pain in right shoulder: Secondary | ICD-10-CM

## 2021-10-20 DIAGNOSIS — M25611 Stiffness of right shoulder, not elsewhere classified: Secondary | ICD-10-CM

## 2021-10-20 NOTE — Therapy (Signed)
OUTPATIENT OCCUPATIONAL THERAPY TREATMENT NOTE   Patient Name: Amber Drake MRN: [id number] DOB:[birth year], 61 y.o., female Today's Date: 10/20/2021  PCP: Dr. [name] REFERRING PROVIDER: Dr. Larena Glassman  END OF SESSION:   OT End of Session - 10/20/21 1110     Visit Number 7    Number of Visits 16    Date for OT Re-Evaluation 11/06/21    Authorization Type 1) BCBS Commercial 2) [facility] Medicaid    Authorization Time Period UHC Medicaid: No auth required at this time, 27 visits per year combined PT/OT/SP    Authorization - Visit Number 6    Authorization - Number of Visits 27    OT Start Time 1030    OT Stop Time 1111    OT Time Calculation (min) 41 min    Activity Tolerance Patient tolerated treatment well    Behavior During Therapy WFL for tasks assessed/performed                Past Medical History:  Diagnosis Date   Anemia    Arthritis    Blood dyscrasia    sickle cell trait   Cancer ([place])    lymphoma   Carbuncle and furuncle    Hypercholesterolemia    Hypertension    does not take meds   Papanicolaou smear of cervix with positive high risk human papilloma virus (HPV) test 08/23/2020   08/23/20    [place] per ASCCP guidelines, immediate risk of CIN 3+ is 4.1 %   Sickle cell trait ([place])    Past Surgical History:  Procedure Laterality Date   APPENDECTOMY     APPLICATION OF CRANIAL NAVIGATION N/A 07/28/2018   Procedure: APPLICATION OF CRANIAL NAVIGATION;  Surgeon: Kary Kos, MD;  Location: [place];  Service: Neurosurgery;  Laterality: N/A;   ARTHOSCOPIC ROTAOR CUFF REPAIR Right 08/24/2021   Procedure: ARTHROSCOPIC ROTATOR CUFF REPAIR;  Surgeon: Mordecai Rasmussen, MD;  [facility];  Service: Orthopedics;  Laterality: Right;   BRAIN SURGERY  2020   CARPAL TUNNEL RELEASE  03/23/2011   Procedure: CARPAL TUNNEL RELEASE;  Surgeon: Sanjuana Kava;  [facility];  Service: Orthopedics;  Laterality: Left;   CARPAL TUNNEL RELEASE  05/10/2011   Procedure: CARPAL  TUNNEL RELEASE;  Surgeon: Sanjuana Kava, MD;  [facility];  Service: Orthopedics;  Laterality: Right;   CESAREAN SECTION     x 2   COLONOSCOPY WITH PROPOFOL N/A 11/16/2020   Procedure: COLONOSCOPY WITH PROPOFOL;  Surgeon: Harvel Quale, MD;  [facility];  Service: Gastroenterology;  Laterality: N/A;  9:15   INCISION AND DRAINAGE PERIRECTAL ABSCESS  12/21/2009   LUMBAR LAMINECTOMY/DECOMPRESSION MICRODISCECTOMY Left 12/12/2020   Procedure: Laminectomy and Foraminotomy - left - L5-S1;  Surgeon: Kary Kos, MD;  Location: [place];  Service: Neurosurgery;  Laterality: Left;  3C   PR DURAL GRAFT REPAIR,SPINE DEFECT N/A 07/28/2018   Procedure: Stereotactic open biopsy of Right cerebellar hemisphere and dura with brainlab;  Surgeon: Kary Kos, MD;  Location: [place];  Service: Neurosurgery;  Laterality: N/A;  Stereotactic open biopsy of Right cerebellar hemisphere and dura with brainlab   PROCTOSCOPY  10/17/2010   Procedure: PROCTOSCOPY;  Surgeon: Scherry Ran;  [facility];  Service: General;  Laterality: N/A;  Rigid Proctoscopy/Possible Fistula in Ano  Procedure ended at [place] Right 08/24/2021   Procedure: SHOULDER ARTHROSCOPY WITH BICEPSTENOTOMY;  Surgeon: Mordecai Rasmussen, MD;  [facility];  Service: Orthopedics;  Laterality:  Right;   THERAPEUTIC ABORTION     x2   Patient Active Problem List   Diagnosis Date Noted   Hidradenitis suppurativa 08/18/2021   Spinal stenosis of lumbar region 12/12/2020   Papanicolaou smear of cervix with positive high risk human papilloma virus (HPV) test 08/23/2020   Hemorrhoids 08/16/2020   Encounter for screening fecal occult blood testing 08/16/2020   Encounter for gynecological examination with Papanicolaou smear of cervix 08/16/2020   Allergic reaction to adhesive 08/16/2020   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   Counseling regarding advance care planning  and goals of care 10/23/2018   Diffuse large B cell lymphoma ([place]) 08/11/2018   Brain tumor ([place]) 07/28/2018   ANA positive 03/24/2018   Carpal tunnel syndrome on both sides 03/24/2018   Chronic pain of both shoulders 03/24/2018   Hidradenitis 07/31/2016   Recurrent boils 07/31/2016   Hyperlipidemia 06/15/2015   Pain of left hand 05/19/2015   Hand pain, right 05/19/2015   Hematuria 03/03/2015   Esophageal reflux 03/03/2015   Essential hypertension, benign 03/03/2015   Bronchitis due to tobacco use 10/17/2010    ONSET DATE: 08/24/21  REFERRING DIAG: s/p right RCR  THERAPY DIAG:  Stiffness of right shoulder, not elsewhere classified  Acute pain of right shoulder  Other symptoms and signs involving the musculoskeletal system  Rationale for Evaluation and Treatment Rehabilitation  PERTINENT HISTORY: Pt is a 61 y/o female s/p right RCR on 08/24/21, presenting with RUE functional deficits limiting ability to perform ADLs using RUE as dominant. Presents in sling, pt was referred to occupational therapy for evaluation and treatment by Dr. Larena Glassman.   PRECAUTIONS: see protocol   WEIGHT BEARING RESTRICTIONS Yes NWB  SUBJECTIVE: S: I have a little bit of pain this morning.    PAIN:  Are you having pain? Yes: NPRS scale: 7/10 Pain location: Incision site Pain description: aching Aggravating factors: movement  Relieving factors: rest, ice  Pain is worse at night     PATIENT GOALS To be able to use her right arm during daily tasks.  OBJECTIVE:   HAND DOMINANCE: Right  FUNCTIONAL OUTCOME MEASURES: FOTO: 4/100   UPPER EXTREMITY ROM        Assessed seated, er/IR adducted Passive ROM Right eval Right 10/12/21  Shoulder flexion 62 155  Shoulder abduction 45 120  Shoulder internal rotation 90 90  Shoulder external rotation 51 45  (Blank rows = not tested)  Active ROM Right eval Right 10/12/21  Shoulder flexion  120  Shoulder abduction   73  Shoulder internal  rotation  90  Shoulder external rotation  45       UPPER EXTREMITY MMT:      Unable to assess due to precautions MMT Right eval Right  10/12/21  Shoulder flexion   3/5  Shoulder abduction   3/5  Shoulder internal rotation   3/5  Shoulder external rotation   3/5  (Blank rows = not tested)          GOALS: Goals reviewed with patient? Yes   SHORT TERM GOALS: Target date: 10/05/2021     Pt will be provided with HEP to improve mobility of RUE required for ADL completion.    Goal status: Ongoing   2.  Pt will increase RUE P/ROM to [facility] to improve ability to perform dressing tasks with minimal compensatory strategies and techniques.    Goal status: Ongoing   3.  Pt will increase RUE strength to 3/5 or greater to  improve ability to reach for items at waist to chest height during functional tasks.    Goal status: MET       LONG TERM GOALS: Target date: 11/02/2021     Pt will return to highest level of functioning using RUE as dominant during ADL and work tasks.    Goal status: Ongoing   2.  Pt will decrease pain in RUE to 3/10 or less to improve ability to use RUE as dominant during housework tasks.    Goal status: Ongoing   3.  Pt will decrease RUE fascial restrictions to min amounts or less to improve mobility required for functional reaching tasks at home and work.    Goal status: Ongoing   4.  Pt will increase RUE A/ROM to [facility] to improve ability to reaching overhead and behind back during dressing and bathing tasks.    Goal status: Ongoing   5.  Pt will increase RUE strength to 4+/5 or greater to improve ability to perform lifting tasks at work.    Goal status: Ongoing    TODAY'S TREATMENT: 10/20/21 -Manual therapy: soft tissue mobilization and myofascial release to right bicep, deltoid, and trapezius regions to address pain and fascial restrictions and increase joint ROM.  -P/ROM: supine, shoulder flexion, abduction, er/IR, horizontal abduction, 5 reps  each -AA/ROM: supine, shoulder protraction, flexion, horizontal abduction, 10 reps each -Proximal shoulder strengthening: supine, paddles, criss cross, circles each direction, 10 reps each -A/ROM: supine, standing er/IR, 10 reps -AA/ROM: standing, abduction, protraction, flexion, horizontal abduction, 10 reps -PVC Pipe Slide: 10X flexion -Wall wash, 1' flexion -Scapular theraband:red, row, extension, 10 reps each -UBE: Level 1, 3' forward 3' reverse, pace: 4.0   10/18/21 -Manual therapy: soft tissue mobilization and myofascial release to right bicep, deltoid, and trapezius regions to address pain and fascial restrictions and increase joint ROM.  -P/ROM: supine, shoulder flexion, abduction, er/IR, horizontal abduction, 5 reps each -AA/ROM: supine, shoulder protraction, flexion, horizontal abduction, 10 reps each -A/ROM: supine, standing er/IR, 10 reps -AA/ROM: standing, abduction, protraction, flexion, horizontal abduction, 10 reps -Wall wash: 1' flexion -Caudle glide: 2x10" seated on mat table, grasping edge of table and leaning away from RUE  10/12/21 -Seated P/ROM: 1x5 shoulder flexion, abduction, IR/er -AA/ROM: 1x10 shoulder forward flexion, abduction, protraction, IR/er -Manual therapy: soft tissue mobilization completed to deltoids and length of biceps -Isometric: 5x5" shoulder extension, flexion, abduction     PATIENT EDUCATION: Education details: continue with current HEP Person educated: Patient Education method: Consulting civil engineer, Demonstration, handout  Education comprehension: verbalized understanding, returned demonstration      HOME EXERCISE PROGRAM: Eval: table slides 6/20: Elbow AROM 7/13: Shoulder AA/ROM   ASSESSMENT:   CLINICAL IMPRESSION: A: Continued with manual techniques, trigger point release to upper trapezius with good response. Pt reports soreness in her forearm today, however notes she helped paint her mother's house yesterday. Continued with passive  stretching with mod cuing for relaxing arm, achieving ROM 75+% today. Continued with AA/ROM, added proximal shoulder strengthening in supine, pvc pipe slide, scapular theraband, and UBE today. Pt unable to complete retraction using theraband today. Consistent cuing for form and technique during exercises.      PLAN:   OT FREQUENCY: 2x/week  OT DURATION: 8 weeks  PLANNED INTERVENTIONS: self care/ADL training, therapeutic exercise, therapeutic activity, manual therapy, passive range of motion, splinting, electrical stimulation, ultrasound, moist heat, patient/family education, and DME and/or AE instructions  RECOMMENDED OTHER SERVICES: Pt will benefit from skilled OT services to decrease pain and fascial restrictions,  increase joint ROM, strength, and functional use of RUE as dominant.  CONSULTED AND AGREED WITH PLAN OF CARE: Patient  PLAN FOR NEXT SESSION: P: follow up on HEP, continue with AA/ROM, add proximal shoulder strengthening in standing          Guadelupe Sabin, OTR/L  [phone] 10/20/2021, 11:11 AM

## 2021-10-23 ENCOUNTER — Other Ambulatory Visit: Payer: Self-pay

## 2021-10-23 ENCOUNTER — Encounter (HOSPITAL_COMMUNITY): Payer: Self-pay | Admitting: Occupational Therapy

## 2021-10-23 ENCOUNTER — Ambulatory Visit (HOSPITAL_COMMUNITY): Payer: BC Managed Care – PPO | Admitting: Occupational Therapy

## 2021-10-23 DIAGNOSIS — R29898 Other symptoms and signs involving the musculoskeletal system: Secondary | ICD-10-CM | POA: Diagnosis not present

## 2021-10-23 DIAGNOSIS — M25611 Stiffness of right shoulder, not elsewhere classified: Secondary | ICD-10-CM | POA: Diagnosis not present

## 2021-10-23 DIAGNOSIS — M25511 Pain in right shoulder: Secondary | ICD-10-CM

## 2021-10-23 NOTE — Therapy (Signed)
OUTPATIENT OCCUPATIONAL THERAPY TREATMENT NOTE   Patient Name: Amber Drake MRN: [id number] DOB:05-15-1960, 61 y.o., female Today's Date: 10/23/2021  PCP: Dr. [name] REFERRING PROVIDER: Dr. Larena Glassman  END OF SESSION:   OT End of Session - 10/23/21 1111     Visit Number 8    Number of Visits 16    Date for OT Re-Evaluation 11/06/21    Authorization Type 1) BCBS Commercial 2) [facility] Medicaid    Authorization Time Period UHC Medicaid: No auth required at this time, 27 visits per year combined PT/OT/SP    Authorization - Visit Number 7    Authorization - Number of Visits 27    OT Start Time 1031    OT Stop Time 1110    OT Time Calculation (min) 39 min    Activity Tolerance Patient tolerated treatment well    Behavior During Therapy WFL for tasks assessed/performed                 Past Medical History:  Diagnosis Date   Anemia    Arthritis    Blood dyscrasia    sickle cell trait   Cancer ([place])    lymphoma   Carbuncle and furuncle    Hypercholesterolemia    Hypertension    does not take meds   Papanicolaou smear of cervix with positive high risk human papilloma virus (HPV) test 08/23/2020   08/23/20    [place] per ASCCP guidelines, immediate risk of CIN 3+ is 4.1 %   Sickle cell trait ([place])    Past Surgical History:  Procedure Laterality Date   APPENDECTOMY     APPLICATION OF CRANIAL NAVIGATION N/A 07/28/2018   Procedure: APPLICATION OF CRANIAL NAVIGATION;  Surgeon: Kary Kos, MD;  Location: [place];  Service: Neurosurgery;  Laterality: N/A;   ARTHOSCOPIC ROTAOR CUFF REPAIR Right 08/24/2021   Procedure: ARTHROSCOPIC ROTATOR CUFF REPAIR;  Surgeon: Mordecai Rasmussen, MD;  [facility];  Service: Orthopedics;  Laterality: Right;   BRAIN SURGERY  2020   CARPAL TUNNEL RELEASE  03/23/2011   Procedure: CARPAL TUNNEL RELEASE;  Surgeon: Sanjuana Kava;  [facility];  Service: Orthopedics;  Laterality: Left;   CARPAL TUNNEL RELEASE  05/10/2011   Procedure:  CARPAL TUNNEL RELEASE;  Surgeon: Sanjuana Kava, MD;  [facility];  Service: Orthopedics;  Laterality: Right;   CESAREAN SECTION     x 2   COLONOSCOPY WITH PROPOFOL N/A 11/16/2020   Procedure: COLONOSCOPY WITH PROPOFOL;  Surgeon: Harvel Quale, MD;  [facility];  Service: Gastroenterology;  Laterality: N/A;  9:15   INCISION AND DRAINAGE PERIRECTAL ABSCESS  12/21/2009   LUMBAR LAMINECTOMY/DECOMPRESSION MICRODISCECTOMY Left 12/12/2020   Procedure: Laminectomy and Foraminotomy - left - L5-S1;  Surgeon: Kary Kos, MD;  Location: [place];  Service: Neurosurgery;  Laterality: Left;  3C   PR DURAL GRAFT REPAIR,SPINE DEFECT N/A 07/28/2018   Procedure: Stereotactic open biopsy of Right cerebellar hemisphere and dura with brainlab;  Surgeon: Kary Kos, MD;  Location: [place];  Service: Neurosurgery;  Laterality: N/A;  Stereotactic open biopsy of Right cerebellar hemisphere and dura with brainlab   PROCTOSCOPY  10/17/2010   Procedure: PROCTOSCOPY;  Surgeon: Scherry Ran;  [facility];  Service: General;  Laterality: N/A;  Rigid Proctoscopy/Possible Fistula in Ano  Procedure ended at [place] Right 08/24/2021   Procedure: SHOULDER ARTHROSCOPY WITH BICEPSTENOTOMY;  Surgeon: Mordecai Rasmussen, MD;  [facility];  Service: Orthopedics;  Laterality: Right;   THERAPEUTIC ABORTION     x2   Patient Active Problem List   Diagnosis Date Noted   Hidradenitis suppurativa 08/18/2021   Spinal stenosis of lumbar region 12/12/2020   Papanicolaou smear of cervix with positive high risk human papilloma virus (HPV) test 08/23/2020   Hemorrhoids 08/16/2020   Encounter for screening fecal occult blood testing 08/16/2020   Encounter for gynecological examination with Papanicolaou smear of cervix 08/16/2020   Allergic reaction to adhesive 08/16/2020   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   Counseling regarding advance care  planning and goals of care 10/23/2018   Diffuse large B cell lymphoma ([place]) 08/11/2018   Brain tumor ([place]) 07/28/2018   ANA positive 03/24/2018   Carpal tunnel syndrome on both sides 03/24/2018   Chronic pain of both shoulders 03/24/2018   Hidradenitis 07/31/2016   Recurrent boils 07/31/2016   Hyperlipidemia 06/15/2015   Pain of left hand 05/19/2015   Hand pain, right 05/19/2015   Hematuria 03/03/2015   Esophageal reflux 03/03/2015   Essential hypertension, benign 03/03/2015   Bronchitis due to tobacco use 10/17/2010    ONSET DATE: 08/24/21  REFERRING DIAG: s/p right RCR  THERAPY DIAG:  Stiffness of right shoulder, not elsewhere classified  Acute pain of right shoulder  Other symptoms and signs involving the musculoskeletal system  Rationale for Evaluation and Treatment Rehabilitation  PERTINENT HISTORY: Pt is a 61 y/o female s/p right RCR on 08/24/21, presenting with RUE functional deficits limiting ability to perform ADLs using RUE as dominant. Presents in sling, pt was referred to occupational therapy for evaluation and treatment by Dr. Larena Glassman.   PRECAUTIONS: see protocol   WEIGHT BEARING RESTRICTIONS Yes NWB  SUBJECTIVE: S: It was killing me last night, felt like it was in the bone.    PAIN:  Are you having pain? Yes: NPRS scale: 8/10 Pain location: anterior shoulder Pain description: deep, in the bone Aggravating factors: movement  Relieving factors: rest, ice  Pain is worse at night     PATIENT GOALS To be able to use her right arm during daily tasks.  OBJECTIVE:   HAND DOMINANCE: Right  FUNCTIONAL OUTCOME MEASURES: FOTO: 4/100   UPPER EXTREMITY ROM        Assessed seated, er/IR adducted Passive ROM Right eval Right 10/12/21  Shoulder flexion 62 155  Shoulder abduction 45 120  Shoulder internal rotation 90 90  Shoulder external rotation 51 45  (Blank rows = not tested)  Active ROM Right eval Right 10/12/21  Shoulder flexion  120   Shoulder abduction   73  Shoulder internal rotation  90  Shoulder external rotation  45       UPPER EXTREMITY MMT:      Unable to assess due to precautions MMT Right eval Right  10/12/21  Shoulder flexion   3/5  Shoulder abduction   3/5  Shoulder internal rotation   3/5  Shoulder external rotation   3/5  (Blank rows = not tested)          GOALS: Goals reviewed with patient? Yes   SHORT TERM GOALS: Target date: 10/05/2021     Pt will be provided with HEP to improve mobility of RUE required for ADL completion.    Goal status: Ongoing   2.  Pt will increase RUE P/ROM to [facility] to improve ability to perform dressing tasks with minimal compensatory strategies and techniques.    Goal status: Ongoing   3.  Pt will  increase RUE strength to 3/5 or greater to improve ability to reach for items at waist to chest height during functional tasks.    Goal status: MET       LONG TERM GOALS: Target date: 11/02/2021     Pt will return to highest level of functioning using RUE as dominant during ADL and work tasks.    Goal status: Ongoing   2.  Pt will decrease pain in RUE to 3/10 or less to improve ability to use RUE as dominant during housework tasks.    Goal status: Ongoing   3.  Pt will decrease RUE fascial restrictions to min amounts or less to improve mobility required for functional reaching tasks at home and work.    Goal status: Ongoing   4.  Pt will increase RUE A/ROM to [facility] to improve ability to reaching overhead and behind back during dressing and bathing tasks.    Goal status: Ongoing   5.  Pt will increase RUE strength to 4+/5 or greater to improve ability to perform lifting tasks at work.    Goal status: Ongoing    TODAY'S TREATMENT: 10/23/21 -Manual therapy: soft tissue mobilization and myofascial release to right bicep, deltoid, and trapezius regions to address pain and fascial restrictions and increase joint ROM.  -P/ROM: supine, shoulder flexion, abduction,  er/IR, horizontal abduction, 5 reps each -AA/ROM: supine, shoulder protraction, flexion, horizontal abduction, 10 reps each -A/ROM: supine, standing er/IR, 10 reps -AA/ROM: standing, abduction, protraction, flexion, horizontal abduction, 10 reps -ES-7.2 CV, interferential, right shoulder, 10 minutes  10/20/21 -Manual therapy: soft tissue mobilization and myofascial release to right bicep, deltoid, and trapezius regions to address pain and fascial restrictions and increase joint ROM.  -P/ROM: supine, shoulder flexion, abduction, er/IR, horizontal abduction, 5 reps each -AA/ROM: supine, shoulder protraction, flexion, horizontal abduction, 10 reps each -Proximal shoulder strengthening: supine, paddles, criss cross, circles each direction, 10 reps each -A/ROM: supine, standing er/IR, 10 reps -AA/ROM: standing, abduction, protraction, flexion, horizontal abduction, 10 reps -PVC Pipe Slide: 10X flexion -Wall wash, 1' flexion -Scapular theraband:red, row, extension, 10 reps each -UBE: Level 1, 3' forward 3' reverse, pace: 4.0   10/18/21 -Manual therapy: soft tissue mobilization and myofascial release to right bicep, deltoid, and trapezius regions to address pain and fascial restrictions and increase joint ROM.  -P/ROM: supine, shoulder flexion, abduction, er/IR, horizontal abduction, 5 reps each -AA/ROM: supine, shoulder protraction, flexion, horizontal abduction, 10 reps each -A/ROM: supine, standing er/IR, 10 reps -AA/ROM: standing, abduction, protraction, flexion, horizontal abduction, 10 reps -Wall wash: 1' flexion -Caudle glide: 2x10" seated on mat table, grasping edge of table and leaning away from RUE     PATIENT EDUCATION: Education details: continue with current HEP Person educated: Patient Education method: Explanation, Demonstration, handout  Education comprehension: verbalized understanding, returned demonstration      HOME EXERCISE PROGRAM: Eval: table slides 6/20: Elbow  AROM 7/13: Shoulder AA/ROM   ASSESSMENT:   CLINICAL IMPRESSION: A: Pt reports she is completing her HEP daily but sometimes does not get 10 reps. Continued with manual techniques, trigger point release to upper trapezius with good response. Pt reports increased pain last night and today, unsure of the origin. Continued with AA/ROM in supine and standing, did not complete proximal shoulder strengthening in standing due to pain. Verbal cuing for form and technique. Trialed ES for pain management today, pt reports pain of 4/10 at end of session.      PLAN:   OT FREQUENCY: 2x/week  OT DURATION: 8  weeks  PLANNED INTERVENTIONS: self care/ADL training, therapeutic exercise, therapeutic activity, manual therapy, passive range of motion, splinting, electrical stimulation, ultrasound, moist heat, patient/family education, and DME and/or AE instructions  RECOMMENDED OTHER SERVICES: Pt will benefit from skilled OT services to decrease pain and fascial restrictions, increase joint ROM, strength, and functional use of RUE as dominant.  CONSULTED AND AGREED WITH PLAN OF CARE: Patient  PLAN FOR NEXT SESSION: P: continue with AA/ROM, add proximal shoulder strengthening in standing          Guadelupe Sabin, OTR/L  [phone] 10/23/2021, 11:12 AM

## 2021-10-25 ENCOUNTER — Encounter (HOSPITAL_COMMUNITY): Payer: Self-pay

## 2021-10-25 ENCOUNTER — Ambulatory Visit (HOSPITAL_COMMUNITY): Payer: BC Managed Care – PPO

## 2021-10-25 DIAGNOSIS — M25511 Pain in right shoulder: Secondary | ICD-10-CM | POA: Diagnosis not present

## 2021-10-25 DIAGNOSIS — R29898 Other symptoms and signs involving the musculoskeletal system: Secondary | ICD-10-CM | POA: Diagnosis not present

## 2021-10-25 DIAGNOSIS — M25611 Stiffness of right shoulder, not elsewhere classified: Secondary | ICD-10-CM | POA: Diagnosis not present

## 2021-10-25 NOTE — Therapy (Signed)
OUTPATIENT OCCUPATIONAL THERAPY TREATMENT NOTE   Patient Name: Amber Drake MRN: [id number] DOB:07-31-1960, 61 y.o., female Today's Date: 10/25/2021  PCP: Dr. [name] REFERRING PROVIDER: Dr. Larena Glassman  END OF SESSION:   OT End of Session - 10/25/21 0911     Visit Number 9    Number of Visits 16    Date for OT Re-Evaluation 11/06/21    Authorization Type 1) BCBS Commercial 2) [facility] Medicaid    Authorization Time Period U[employer] Medicaid: No auth required at this time, 27 visits per year combined PT/OT/SP    Authorization - Visit Number 8    Authorization - Number of Visits 27    OT Start Time 0907    OT Stop Time 0940    OT Time Calculation (min) 33 min    Activity Tolerance Patient tolerated treatment well    Behavior During Therapy WFL for tasks assessed/performed                 Past Medical History:  Diagnosis Date   Anemia    Arthritis    Blood dyscrasia    sickle cell trait   Cancer ([place])    lymphoma   Carbuncle and furuncle    Hypercholesterolemia    Hypertension    does not take meds   Papanicolaou smear of cervix with positive high risk human papilloma virus (HPV) test 08/23/2020   08/23/20    [place] per ASCCP guidelines, immediate risk of CIN 3+ is 4.1 %   Sickle cell trait ([place])    Past Surgical History:  Procedure Laterality Date   APPENDECTOMY     APPLICATION OF CRANIAL NAVIGATION N/A 07/28/2018   Procedure: APPLICATION OF CRANIAL NAVIGATION;  Surgeon: Kary Kos, MD;  Location: [facility];  Service: Neurosurgery;  Laterality: N/A;   ARTHOSCOPIC ROTAOR CUFF REPAIR Right 08/24/2021   Procedure: ARTHROSCOPIC ROTATOR CUFF REPAIR;  Surgeon: Mordecai Rasmussen, MD;  Location: [facility];  Service: Orthopedics;  Laterality: Right;   BRAIN SURGERY  2020   CARPAL TUNNEL RELEASE  03/23/2011   Procedure: CARPAL TUNNEL RELEASE;  Surgeon: Sanjuana Kava;  Location: [facility];  Service: Orthopedics;  Laterality: Left;   CARPAL TUNNEL RELEASE  05/10/2011   Procedure:  CARPAL TUNNEL RELEASE;  Surgeon: Sanjuana Kava, MD;  Location: [facility];  Service: Orthopedics;  Laterality: Right;   CESAREAN SECTION     x 2   COLONOSCOPY WITH PROPOFOL N/A 11/16/2020   Procedure: COLONOSCOPY WITH PROPOFOL;  Surgeon: Harvel Quale, MD;  [facility];  Service: Gastroenterology;  Laterality: N/A;  9:15   INCISION AND DRAINAGE PERIRECTAL ABSCESS  12/21/2009   LUMBAR LAMINECTOMY/DECOMPRESSION MICRODISCECTOMY Left 12/12/2020   Procedure: Laminectomy and Foraminotomy - left - L5-S1;  Surgeon: Kary Kos, MD;  Location: [place];  Service: Neurosurgery;  Laterality: Left;  3C   PR DURAL GRAFT REPAIR,SPINE DEFECT N/A 07/28/2018   Procedure: Stereotactic open biopsy of Right cerebellar hemisphere and dura with brainlab;  Surgeon: Kary Kos, MD;  Location: [place];  Service: Neurosurgery;  Laterality: N/A;  Stereotactic open biopsy of Right cerebellar hemisphere and dura with brainlab   PROCTOSCOPY  10/17/2010   Procedure: PROCTOSCOPY;  Surgeon: Scherry Ran;  Location: [facility];  Service: General;  Laterality: N/A;  Rigid Proctoscopy/Possible Fistula in Ano  Procedure ended at [place] Right 08/24/2021   Procedure: SHOULDER ARTHROSCOPY WITH BICEPSTENOTOMY;  Surgeon: Mordecai Rasmussen, MD;  Location: [facility];  Service: Orthopedics;  Laterality: Right;   THERAPEUTIC ABORTION     x2   Patient Active Problem List   Diagnosis Date Noted   Hidradenitis suppurativa 08/18/2021   Spinal stenosis of lumbar region 12/12/2020   Papanicolaou smear of cervix with positive high risk human papilloma virus (HPV) test 08/23/2020   Hemorrhoids 08/16/2020   Encounter for screening fecal occult blood testing 08/16/2020   Encounter for gynecological examination with Papanicolaou smear of cervix 08/16/2020   Allergic reaction to adhesive 08/16/2020   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   Counseling regarding advance care  planning and goals of care 10/23/2018   Diffuse large B cell lymphoma ([place]) 08/11/2018   Brain tumor ([place]) 07/28/2018   ANA positive 03/24/2018   Carpal tunnel syndrome on both sides 03/24/2018   Chronic pain of both shoulders 03/24/2018   Hidradenitis 07/31/2016   Recurrent boils 07/31/2016   Hyperlipidemia 06/15/2015   Pain of left hand 05/19/2015   Hand pain, right 05/19/2015   Hematuria 03/03/2015   Esophageal reflux 03/03/2015   Essential hypertension, benign 03/03/2015   Bronchitis due to tobacco use 10/17/2010    ONSET DATE: 08/24/21  REFERRING DIAG: s/p right RCR  THERAPY DIAG:  Stiffness of right shoulder, not elsewhere classified  Acute pain of right shoulder  Other symptoms and signs involving the musculoskeletal system  Rationale for Evaluation and Treatment Rehabilitation  PERTINENT HISTORY: Pt is a 61 y/o female s/p right RCR on 08/24/21, presenting with RUE functional deficits limiting ability to perform ADLs using RUE as dominant. Presents in sling, pt was referred to occupational therapy for evaluation and treatment by Dr. Larena Glassman.   PRECAUTIONS: see protocol   WEIGHT BEARING RESTRICTIONS Yes NWB  SUBJECTIVE: S: It hurts the most at night, I have to shake it out and take medication.   Short session as pt had another apt   PAIN:  Are you having pain? Yes: NPRS scale: 8/10 Pain location: anterior shoulder Pain description: deep, in the bone Aggravating factors: movement  Relieving factors: rest, ice  Pain is worse at night     PATIENT GOALS To be able to use her right arm during daily tasks.  OBJECTIVE:   HAND DOMINANCE: Right  FUNCTIONAL OUTCOME MEASURES: FOTO: 4/100   UPPER EXTREMITY ROM        Assessed seated, er/IR adducted Passive ROM Right eval Right 10/12/21  Shoulder flexion 62 155  Shoulder abduction 45 120  Shoulder internal rotation 90 90  Shoulder external rotation 51 45  (Blank rows = not tested)  Active ROM  Right eval Right 10/12/21  Shoulder flexion  120  Shoulder abduction   73  Shoulder internal rotation  90  Shoulder external rotation  45       UPPER EXTREMITY MMT:      Unable to assess due to precautions MMT Right eval Right  10/12/21  Shoulder flexion   3/5  Shoulder abduction   3/5  Shoulder internal rotation   3/5  Shoulder external rotation   3/5  (Blank rows = not tested)          GOALS: Goals reviewed with patient? Yes   SHORT TERM GOALS: Target date: 10/05/2021     Pt will be provided with HEP to improve mobility of RUE required for ADL completion.    Goal status: Ongoing   2.  Pt will increase RUE P/ROM to [facility] to improve ability to perform dressing tasks with minimal compensatory strategies and techniques.  Goal status: Ongoing   3.  Pt will increase RUE strength to 3/5 or greater to improve ability to reach for items at waist to chest height during functional tasks.    Goal status: MET       LONG TERM GOALS: Target date: 11/02/2021     Pt will return to highest level of functioning using RUE as dominant during ADL and work tasks.    Goal status: Ongoing   2.  Pt will decrease pain in RUE to 3/10 or less to improve ability to use RUE as dominant during housework tasks.    Goal status: Ongoing   3.  Pt will decrease RUE fascial restrictions to min amounts or less to improve mobility required for functional reaching tasks at home and work.    Goal status: Ongoing   4.  Pt will increase RUE A/ROM to [facility] to improve ability to reaching overhead and behind back during dressing and bathing tasks.    Goal status: Ongoing   5.  Pt will increase RUE strength to 4+/5 or greater to improve ability to perform lifting tasks at work.    Goal status: Ongoing    TODAY'S TREATMENT:  10/25/21 --Manual therapy: soft tissue mobilization and myofascial release to right bicep, deltoid, and trapezius regions to address pain and fascial restrictions and increase  joint ROM.  -P/ROM: supine, shoulder flexion, abduction, er/IR, horizontal abduction, 10 reps each. Rest breaks taken due to high levels of pain  10/23/21 -Manual therapy: soft tissue mobilization and myofascial release to right bicep, deltoid, and trapezius regions to address pain and fascial restrictions and increase joint ROM.  -P/ROM: supine, shoulder flexion, abduction, er/IR, horizontal abduction, 5 reps each -AA/ROM: supine, shoulder protraction, flexion, horizontal abduction, 10 reps each -A/ROM: supine, standing er/IR, 10 reps -AA/ROM: standing, abduction, protraction, flexion, horizontal abduction, 10 reps -ES-7.2 CV, interferential, right shoulder, 10 minutes  10/20/21 -Manual therapy: soft tissue mobilization and myofascial release to right bicep, deltoid, and trapezius regions to address pain and fascial restrictions and increase joint ROM.  -P/ROM: supine, shoulder flexion, abduction, er/IR, horizontal abduction, 5 reps each -AA/ROM: supine, shoulder protraction, flexion, horizontal abduction, 10 reps each -Proximal shoulder strengthening: supine, paddles, criss cross, circles each direction, 10 reps each -A/ROM: supine, standing er/IR, 10 reps -AA/ROM: standing, abduction, protraction, flexion, horizontal abduction, 10 reps -PVC Pipe Slide: 10X flexion -Wall wash, 1' flexion -Scapular theraband:red, row, extension, 10 reps each -UBE: Level 1, 3' forward 3' reverse, pace: 4.0     PATIENT EDUCATION: Education details: continue with current HEP Person educated: Patient Education method: Explanation, Demonstration, handout  Education comprehension: verbalized understanding, returned demonstration      HOME EXERCISE PROGRAM: Eval: table slides 6/20: Elbow AROM 7/13: Shoulder AA/ROM   ASSESSMENT:   CLINICAL IMPRESSION: A: Pt reports high levels of pain at start of session and throughout. Tight muscles found throughout RUE with therapist cuing for deep breathing  throughout manual. With short time, focused on P/ROM with prolonged hold at end range with therapist cuing for deep breathing and relaxing shoulder musculature as pt continues to be very guarded. Able to increase passive range of motion with each repetition, flexion to about 165 degrees and abduction to 120 degrees.      PLAN:   OT FREQUENCY: 2x/week  OT DURATION: 8 weeks  PLANNED INTERVENTIONS: self care/ADL training, therapeutic exercise, therapeutic activity, manual therapy, passive range of motion, splinting, electrical stimulation, ultrasound, moist heat, patient/family education, and DME and/or AE instructions  RECOMMENDED  OTHER SERVICES: Pt will benefit from skilled OT services to decrease pain and fascial restrictions, increase joint ROM, strength, and functional use of RUE as dominant.  CONSULTED AND AGREED WITH PLAN OF CARE: Patient  PLAN FOR NEXT SESSION: P: continue with AA/ROM, add proximal shoulder strengthening in standing          Flonnie Hailstone, [place], OTR/L [phone]  10/25/2021, 9:46 AM

## 2021-10-26 ENCOUNTER — Other Ambulatory Visit: Payer: Self-pay

## 2021-10-26 ENCOUNTER — Emergency Department (HOSPITAL_COMMUNITY)
Admission: EM | Admit: 2021-10-26 | Discharge: 2021-10-26 | Disposition: A | Discharge status: Home / Self Care | Payer: BC Managed Care – PPO | Attending: Emergency Medicine | Admitting: Emergency Medicine

## 2021-10-26 ENCOUNTER — Encounter (HOSPITAL_COMMUNITY): Payer: Self-pay | Admitting: *Deleted

## 2021-10-26 ENCOUNTER — Emergency Department (HOSPITAL_COMMUNITY): Payer: BC Managed Care – PPO

## 2021-10-26 DIAGNOSIS — K5900 Constipation, unspecified: Secondary | ICD-10-CM | POA: Insufficient documentation

## 2021-10-26 DIAGNOSIS — L03315 Cellulitis of perineum: Secondary | ICD-10-CM | POA: Diagnosis not present

## 2021-10-26 DIAGNOSIS — N762 Acute vulvitis: Secondary | ICD-10-CM

## 2021-10-26 DIAGNOSIS — N898 Other specified noninflammatory disorders of vagina: Secondary | ICD-10-CM | POA: Diagnosis not present

## 2021-10-26 MED ORDER — DOXYCYCLINE HYCLATE 100 MG PO CAPS: 100.0000 mg | ORAL_CAPSULE | Freq: Two times a day (BID) | ORAL | 0 refills | Status: DC

## 2021-10-26 NOTE — ED Triage Notes (Signed)
PT in c/o inguinal & inner R leg boils that is chronic, this episode started last week per the pt, the pt c/o constipation with LBM today, denies fever & chills, A&O x4

## 2021-10-26 NOTE — Discharge Instructions (Addendum)
I have sent the antibiotic to the pharmacy as we discussed.  Your x-ray looks normal, continue to use laxatives if you feel constipated however it does not appear as though you have a large stool burden at this time.  Return with any worsening symptoms and continue to follow-up with your PCP about your recurrent abscesses.  It was a pleasure to meet you and I hope you feel better!

## 2021-10-26 NOTE — ED Notes (Signed)
See triage notes. Nad. Color wnl. A/o. ambulatory

## 2021-10-26 NOTE — ED Provider Notes (Signed)
[facility] EMERGENCY DEPARTMENT Provider Note   CSN: [id number] Arrival date & time: 10/26/21  1635     History  Chief Complaint  Patient presents with   Abscess    Amber Drake is a 61 y.o. female with a past medical history of recurrent abscesses and tobacco use disorder presenting today with concern for inguinal abscesses.  Reports that these have been coming and going over the last few days.  Reports one on the left side has subsided.  Says that she is unwilling to have an I&D but would like some antibiotics.  Also feels like she is dealing with constipation.  Last bowel movement today but she feels like she is not completely voiding.  She reports that she regularly has 5-6 bowel movements a day and has been using MiraLAX and coffee for assistance moving her stools.  Still passing gas.  Is currently on oxycodone after a shoulder surgery in May.   Abscess Associated symptoms: no fever, no nausea and no vomiting        Home Medications Prior to Admission medications   Medication Sig Start Date End Date Taking? Authorizing Provider  doxycycline (VIBRAMYCIN) 100 MG capsule Take 1 capsule (100 mg total) by mouth 2 (two) times daily. 10/26/21  Yes Vivica Dobosz A, PA-C  atorvastatin (LIPITOR) 20 MG tablet Take 1 tablet (20 mg total) by mouth daily. 03/14/15   Soyla Dryer, PA-C  lisinopril-hydrochlorothiazide (PRINZIDE,ZESTORETIC) 20-12.5 MG tablet Take 1 tablet by mouth daily.    [provider]  meclizine (ANTIVERT) 12.5 MG tablet Take 1 tablet (12.5 mg total) by mouth 3 (three) times daily as needed for dizziness. 09/25/20   Davonna Belling, MD  Multiple Vitamin (MULTIVITAMIN WITH MINERALS) TABS tablet Take 1 tablet by mouth daily.    [provider]  oxyCODONE (OXY IR/ROXICODONE) 5 MG immediate release tablet Take 5 mg by mouth every 6 (six) hours as needed. 10/02/21   [provider]  potassium chloride SA (KLOR-CON) 20 MEQ tablet TAKE (1) TABLET  BY MOUTH TWICE DAILY. 01/31/21   Brunetta Genera, MD  silver sulfADIAZINE (SILVADENE) 1 % cream Apply 1 application. topically daily. 08/18/21   Estill Dooms, NP      Allergies    Penicillins, Penicillins cross reactors, and Tape    Review of Systems   Review of Systems  Constitutional:  Negative for chills and fever.  Gastrointestinal:  Positive for constipation. Negative for abdominal pain, diarrhea, nausea and vomiting.  Genitourinary:  Negative for dysuria, hematuria and vaginal discharge.  Skin:  Positive for wound.    Physical Exam Updated Vital Signs BP 125/65 (BP Location: Left Arm)   Pulse 76   Temp 98.2 F (36.8 C) (Oral)   Resp 14   Ht '5\' 5"'$  (1.651 m)   LMP 07/18/2010   SpO2 99%   BMI 27.96 kg/m  Physical Exam Vitals and nursing note reviewed.  Constitutional:      Appearance: Normal appearance.  HENT:     Head: Normocephalic and atraumatic.  Eyes:     General: No scleral icterus.    Conjunctiva/sclera: Conjunctivae normal.  Pulmonary:     Effort: Pulmonary effort is normal. No respiratory distress.  Abdominal:     General: Abdomen is flat.     Palpations: Abdomen is soft.     Tenderness: There is no abdominal tenderness.  Genitourinary:    Comments: Patient with some cellulitis to the left inguinal fold the left labia majora.  No signs of  abscesses or fluctuance pelvic area.  1 well-healed area where previous incision and drainage was just to the right of the right labia majora Skin:    Findings: No rash.  Neurological:     Mental Status: She is alert.  Psychiatric:        Mood and Affect: Mood normal.     ED Results / Procedures / Treatments   Labs (all labs ordered are listed, but only abnormal results are displayed) Labs Reviewed - No data to display  EKG None  Radiology DG Abdomen 1 View  Result Date: 10/26/2021 CLINICAL DATA:  Constipation EXAM: ABDOMEN - 1 VIEW COMPARISON:  CT 04/20/2021 FINDINGS: The bowel gas pattern is  normal. No radio-opaque calculi or other significant radiographic abnormality are seen. Only mild stool in the colon. Phleboliths in the pelvis IMPRESSION: Negative. Electronically Signed   By: Donavan Foil M.D.   On: 10/26/2021 18:11    Procedures Procedures   Medications Ordered in ED Medications - No data to display  ED Course/ Medical Decision Making/ A&P                           Medical Decision Making Amount and/or Complexity of Data Reviewed Radiology: ordered.  Risk Prescription drug management.   61 year old female presenting with recurrent abscesses.  Differential includes but is not limited to type 2 diabetes, IVDU, HS. also concerned about constipation.  Differential for this includes but is not limited to bowel obstruction, electrolyte abnormality, dehydration, medication side effect.  Physical exam: Abdomen soft and nontender  Imaging: Abdominal x-ray reviewed and interpreted by me.  I agree with the radiologist that there are no signs of constipation.  This is pretty consistent with her report of multiple daily bowel movements.  MDM/disposition: Patient request no I&D.  There is also no area that I believe requires incision and drainage, no fluctuance.  There is some cellulitis noted bilaterally.  I believe it is reasonable to treat her with antibiotics.  Per chart review she has a history of hidradenitis suppurativa.  She says that her son has similar abscesses.  She will continue to follow with her PCP about this.  Doxycycline sent to the pharmacy.  She can continue to use MiraLAX as needed for any feelings of constipation.  Return precautions discussed and she is requesting and agreeable to discharge at this time.   Final Clinical Impression(s) / ED Diagnoses Final diagnoses:  Constipation, unspecified constipation type  Cellulitis of labia    Rx / DC Orders ED Discharge Orders          Ordered    doxycycline (VIBRAMYCIN) 100 MG capsule  2 times daily         10/26/21 1734           Results and diagnoses were explained to the patient. Return precautions discussed in full. Patient had no additional questions and expressed complete understanding.   This chart was dictated using voice recognition software.  Despite best efforts to proofread,  errors can occur which can change the documentation meaning.    Rhae Hammock, PA-C 92/11/94 1740    Lianne Cure, DO 81/44/81 2101

## 2021-10-30 ENCOUNTER — Inpatient Hospital Stay: Payer: BC Managed Care – PPO | Attending: Hematology

## 2021-10-30 ENCOUNTER — Other Ambulatory Visit: Payer: Self-pay | Admitting: Hematology

## 2021-10-30 ENCOUNTER — Other Ambulatory Visit: Payer: Self-pay

## 2021-10-30 ENCOUNTER — Inpatient Hospital Stay (HOSPITAL_BASED_OUTPATIENT_CLINIC_OR_DEPARTMENT_OTHER): Payer: BC Managed Care – PPO | Admitting: Hematology

## 2021-10-30 VITALS — BP 121/67 | HR 87 | Temp 97.8°F | Resp 17 | Ht 65.0 in | Wt 170.8 lb

## 2021-10-30 DIAGNOSIS — Z880 Allergy status to penicillin: Secondary | ICD-10-CM | POA: Insufficient documentation

## 2021-10-30 DIAGNOSIS — Z79899 Other long term (current) drug therapy: Secondary | ICD-10-CM | POA: Insufficient documentation

## 2021-10-30 DIAGNOSIS — Z9049 Acquired absence of other specified parts of digestive tract: Secondary | ICD-10-CM | POA: Insufficient documentation

## 2021-10-30 DIAGNOSIS — C8309 Small cell B-cell lymphoma, extranodal and solid organ sites: Secondary | ICD-10-CM | POA: Insufficient documentation

## 2021-10-30 DIAGNOSIS — C884 Extranodal marginal zone B-cell lymphoma of mucosa-associated lymphoid tissue [MALT-lymphoma]: Secondary | ICD-10-CM

## 2021-10-30 DIAGNOSIS — M25519 Pain in unspecified shoulder: Secondary | ICD-10-CM | POA: Diagnosis not present

## 2021-10-30 DIAGNOSIS — I10 Essential (primary) hypertension: Secondary | ICD-10-CM | POA: Insufficient documentation

## 2021-10-30 DIAGNOSIS — Z888 Allergy status to other drugs, medicaments and biological substances status: Secondary | ICD-10-CM | POA: Insufficient documentation

## 2021-10-30 LAB — CBC WITH DIFFERENTIAL (CANCER CENTER ONLY)
Abs Immature Granulocytes: 0.01 10*3/uL (ref 0.00–0.07)
Basophils Absolute: 0 10*3/uL (ref 0.0–0.1)
Basophils Relative: 0 %
Eosinophils Absolute: 0.1 10*3/uL (ref 0.0–0.5)
Eosinophils Relative: 1 %
HCT: 36.2 % (ref 36.0–46.0)
Hemoglobin: 12.3 g/dL (ref 12.0–15.0)
Immature Granulocytes: 0 %
Lymphocytes Relative: 31 %
Lymphs Abs: 2.1 10*3/uL (ref 0.7–4.0)
MCH: 28.5 pg (ref 26.0–34.0)
MCHC: 34 g/dL (ref 30.0–36.0)
MCV: 83.8 fL (ref 80.0–100.0)
Monocytes Absolute: 0.5 10*3/uL (ref 0.1–1.0)
Monocytes Relative: 8 %
Neutro Abs: 4 10*3/uL (ref 1.7–7.7)
Neutrophils Relative %: 60 %
Platelet Count: 375 10*3/uL (ref 150–400)
RBC: 4.32 MIL/uL (ref 3.87–5.11)
RDW: 12.4 % (ref 11.5–15.5)
WBC Count: 6.7 10*3/uL (ref 4.0–10.5)
nRBC: 0 % (ref 0.0–0.2)

## 2021-10-30 LAB — CMP (CANCER CENTER ONLY)
ALT: 16 U/L (ref 0–44)
AST: 18 U/L (ref 15–41)
Albumin: 4.3 g/dL (ref 3.5–5.0)
Alkaline Phosphatase: 70 U/L (ref 38–126)
Anion gap: 4 — ABNORMAL LOW (ref 5–15)
BUN: 13 mg/dL (ref 6–20)
CO2: 33 mmol/L — ABNORMAL HIGH (ref 22–32)
Calcium: 10.2 mg/dL (ref 8.9–10.3)
Chloride: 104 mmol/L (ref 98–111)
Creatinine: 0.71 mg/dL (ref 0.44–1.00)
GFR, Estimated: >60 mL/min (ref >60)
Glucose, Bld: 105 mg/dL — ABNORMAL HIGH (ref 70–99)
Potassium: 4.1 mmol/L (ref 3.5–5.1)
Sodium: 141 mmol/L (ref 135–145)
Total Bilirubin: 0.4 mg/dL (ref 0.3–1.2)
Total Protein: 7.1 g/dL (ref 6.5–8.1)

## 2021-10-30 LAB — LACTATE DEHYDROGENASE: LDH: 148 U/L (ref 98–192)

## 2021-10-31 ENCOUNTER — Ambulatory Visit (HOSPITAL_COMMUNITY): Payer: BC Managed Care – PPO | Attending: Orthopedic Surgery

## 2021-10-31 ENCOUNTER — Telehealth: Payer: Self-pay | Admitting: Orthopedic Surgery

## 2021-10-31 ENCOUNTER — Encounter (HOSPITAL_COMMUNITY): Payer: Self-pay

## 2021-10-31 DIAGNOSIS — M25511 Pain in right shoulder: Secondary | ICD-10-CM | POA: Diagnosis not present

## 2021-10-31 DIAGNOSIS — M25611 Stiffness of right shoulder, not elsewhere classified: Secondary | ICD-10-CM | POA: Diagnosis not present

## 2021-10-31 DIAGNOSIS — R29898 Other symptoms and signs involving the musculoskeletal system: Secondary | ICD-10-CM | POA: Insufficient documentation

## 2021-10-31 NOTE — Telephone Encounter (Signed)
Amber Drake,   Patient called earlier about her FMLA, I transferred the voicemail to you.  She also came in the office , I told her you was working in clinic this afternoon.  You just had came out to call a patient back.    She states they faxed the paperwork over on 10/25/21 .  Please call her back at [phone]

## 2021-10-31 NOTE — Therapy (Signed)
OUTPATIENT OCCUPATIONAL THERAPY TREATMENT NOTE   Patient Name: Jayleena Stille MRN: [id number] DOB:09-05-1960, 61 y.o., female Today's Date: 10/31/2021  PCP: Dr. [name] REFERRING PROVIDER: Dr. Larena Glassman  END OF SESSION:   OT End of Session - 10/31/21 0733     Visit Number 10    Number of Visits 16    Date for OT Re-Evaluation 11/06/21    Authorization Type 1) BCBS Commercial 2) [facility] Medicaid    Authorization Time Period UHC Medicaid: No auth required at this time, 27 visits per year combined PT/OT/SP    Authorization - Visit Number 9    Authorization - Number of Visits 27    OT Start Time 0731    OT Stop Time [phone]    OT Time Calculation (min) 43 min    Activity Tolerance Patient tolerated treatment well    Behavior During Therapy WFL for tasks assessed/performed                 Past Medical History:  Diagnosis Date   Anemia    Arthritis    Blood dyscrasia    sickle cell trait   Cancer ([place])    lymphoma   Carbuncle and furuncle    Hypercholesterolemia    Hypertension    does not take meds   Papanicolaou smear of cervix with positive high risk human papilloma virus (HPV) test 08/23/2020   08/23/20    [place] per ASCCP guidelines, immediate risk of CIN 3+ is 4.1 %   Sickle cell trait ([place])    Past Surgical History:  Procedure Laterality Date   APPENDECTOMY     APPLICATION OF CRANIAL NAVIGATION N/A 07/28/2018   Procedure: APPLICATION OF CRANIAL NAVIGATION;  Surgeon: Kary Kos, MD;  Location: [place];  Service: Neurosurgery;  Laterality: N/A;   ARTHOSCOPIC ROTAOR CUFF REPAIR Right 08/24/2021   Procedure: ARTHROSCOPIC ROTATOR CUFF REPAIR;  Surgeon: Mordecai Rasmussen, MD;  [facility];  Service: Orthopedics;  Laterality: Right;   BRAIN SURGERY  2020   CARPAL TUNNEL RELEASE  03/23/2011   Procedure: CARPAL TUNNEL RELEASE;  Surgeon: Sanjuana Kava;  [facility];  Service: Orthopedics;  Laterality: Left;   CARPAL TUNNEL RELEASE  05/10/2011   Procedure:  CARPAL TUNNEL RELEASE;  Surgeon: Sanjuana Kava, MD;  [facility];  Service: Orthopedics;  Laterality: Right;   CESAREAN SECTION     x 2   COLONOSCOPY WITH PROPOFOL N/A 11/16/2020   Procedure: COLONOSCOPY WITH PROPOFOL;  Surgeon: Harvel Quale, MD;  [facility];  Service: Gastroenterology;  Laterality: N/A;  9:15   INCISION AND DRAINAGE PERIRECTAL ABSCESS  12/21/2009   LUMBAR LAMINECTOMY/DECOMPRESSION MICRODISCECTOMY Left 12/12/2020   Procedure: Laminectomy and Foraminotomy - left - L5-S1;  Surgeon: Kary Kos, MD;  Location: [place];  Service: Neurosurgery;  Laterality: Left;  3C   PR DURAL GRAFT REPAIR,SPINE DEFECT N/A 07/28/2018   Procedure: Stereotactic open biopsy of Right cerebellar hemisphere and dura with brainlab;  Surgeon: Kary Kos, MD;  Location: [place];  Service: Neurosurgery;  Laterality: N/A;  Stereotactic open biopsy of Right cerebellar hemisphere and dura with brainlab   PROCTOSCOPY  10/17/2010   Procedure: PROCTOSCOPY;  Surgeon: Scherry Ran;  [facility];  Service: General;  Laterality: N/A;  Rigid Proctoscopy/Possible Fistula in Ano  Procedure ended at [place] Right 08/24/2021   Procedure: SHOULDER ARTHROSCOPY WITH BICEPSTENOTOMY;  Surgeon: Mordecai Rasmussen, MD;  [facility];  Service: Orthopedics;  Laterality: Right;   THERAPEUTIC ABORTION     x2   Patient Active Problem List   Diagnosis Date Noted   Hidradenitis suppurativa 08/18/2021   Spinal stenosis of lumbar region 12/12/2020   Papanicolaou smear of cervix with positive high risk human papilloma virus (HPV) test 08/23/2020   Hemorrhoids 08/16/2020   Encounter for screening fecal occult blood testing 08/16/2020   Encounter for gynecological examination with Papanicolaou smear of cervix 08/16/2020   Allergic reaction to adhesive 08/16/2020   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   Counseling regarding advance care  planning and goals of care 10/23/2018   Diffuse large B cell lymphoma ([place]) 08/11/2018   Brain tumor ([place]) 07/28/2018   ANA positive 03/24/2018   Carpal tunnel syndrome on both sides 03/24/2018   Chronic pain of both shoulders 03/24/2018   Hidradenitis 07/31/2016   Recurrent boils 07/31/2016   Hyperlipidemia 06/15/2015   Pain of left hand 05/19/2015   Hand pain, right 05/19/2015   Hematuria 03/03/2015   Esophageal reflux 03/03/2015   Essential hypertension, benign 03/03/2015   Bronchitis due to tobacco use 10/17/2010    ONSET DATE: 08/24/21  REFERRING DIAG: s/p right RCR  THERAPY DIAG:  Stiffness of right shoulder, not elsewhere classified  Acute pain of right shoulder  Other symptoms and signs involving the musculoskeletal system  Rationale for Evaluation and Treatment Rehabilitation  PERTINENT HISTORY: Pt is a 61 y/o female s/p right RCR on 08/24/21, presenting with RUE functional deficits limiting ability to perform ADLs using RUE as dominant. Presents in sling, pt was referred to occupational therapy for evaluation and treatment by Dr. Larena Glassman.   PRECAUTIONS: see protocol   WEIGHT BEARING RESTRICTIONS Yes NWB  SUBJECTIVE: S: "I am still in pain, this arm, right here. But I think its a little better"   PAIN:  Are you having pain? Yes: NPRS scale: 6/10 Pain location: anterior shoulder Pain description: deep, in the bone Aggravating factors: movement  Relieving factors: rest, ice  Pain is worse at night     PATIENT GOALS To be able to use her right arm during daily tasks.  OBJECTIVE:   HAND DOMINANCE: Right  FUNCTIONAL OUTCOME MEASURES: FOTO: 4/100   UPPER EXTREMITY ROM        Assessed seated, er/IR adducted Passive ROM Right eval Right 10/12/21  Shoulder flexion 62 155  Shoulder abduction 45 120  Shoulder internal rotation 90 90  Shoulder external rotation 51 45  (Blank rows = not tested)  Active ROM Right eval Right 10/12/21  Shoulder  flexion  120  Shoulder abduction   73  Shoulder internal rotation  90  Shoulder external rotation  45       UPPER EXTREMITY MMT:      Unable to assess due to precautions MMT Right eval Right  10/12/21  Shoulder flexion   3/5  Shoulder abduction   3/5  Shoulder internal rotation   3/5  Shoulder external rotation   3/5  (Blank rows = not tested)          GOALS: Goals reviewed with patient? Yes   SHORT TERM GOALS: Target date: 10/05/2021     Pt will be provided with HEP to improve mobility of RUE required for ADL completion.    Goal status: Ongoing   2.  Pt will increase RUE P/ROM to [facility] to improve ability to perform dressing tasks with minimal compensatory strategies and techniques.    Goal status: Ongoing   3.  Pt will increase RUE strength to 3/5 or greater to improve ability to reach for items at waist to chest height during functional tasks.    Goal status: MET       LONG TERM GOALS: Target date: 11/02/2021     Pt will return to highest level of functioning using RUE as dominant during ADL and work tasks.    Goal status: Ongoing   2.  Pt will decrease pain in RUE to 3/10 or less to improve ability to use RUE as dominant during housework tasks.    Goal status: Ongoing   3.  Pt will decrease RUE fascial restrictions to min amounts or less to improve mobility required for functional reaching tasks at home and work.    Goal status: Ongoing   4.  Pt will increase RUE A/ROM to [facility] to improve ability to reaching overhead and behind back during dressing and bathing tasks.    Goal status: Ongoing   5.  Pt will increase RUE strength to 4+/5 or greater to improve ability to perform lifting tasks at work.    Goal status: Ongoing    TODAY'S TREATMENT:  10/31/21 -Manual therapy: soft tissue mobilization and myofascial release to right bicep, deltoid, and trapezius regions to address pain and fascial restrictions and increase joint ROM.  -AA/ROM: Supine, 1x5, flexion,  protraction, abduction, horizontal abduction, IR/er  -AA/ROM: Standing, 1x5, flexion, protraction, abduction, horizontal abduction, IR/er  -Wall wash, 1' flexion -Scapular theraband:red, row, extension, 10 reps each -UBE: Level 1, 3' forward 3' reverse, pace: 4.0 -Functional Reach: Pt reaching to top shelf, removing all objects, moving middle shelf objects to top shelf, items from counter to top shelf  10/25/21 --Manual therapy: soft tissue mobilization and myofascial release to right bicep, deltoid, and trapezius regions to address pain and fascial restrictions and increase joint ROM.  -P/ROM: supine, shoulder flexion, abduction, er/IR, horizontal abduction, 10 reps each. Rest breaks taken due to high levels of pain  10/23/21 -Manual therapy: soft tissue mobilization and myofascial release to right bicep, deltoid, and trapezius regions to address pain and fascial restrictions and increase joint ROM.  -P/ROM: supine, shoulder flexion, abduction, er/IR, horizontal abduction, 5 reps each -AA/ROM: supine, shoulder protraction, flexion, horizontal abduction, 10 reps each -A/ROM: supine, standing er/IR, 10 reps -AA/ROM: standing, abduction, protraction, flexion, horizontal abduction, 10 reps -ES-7.2 CV, interferential, right shoulder, 10 minutes    PATIENT EDUCATION: Education details: English as a second language teacher  Person educated: Patient Education method: Explanation, Demonstration, handout  Education comprehension: verbalized understanding, returned demonstration      HOME EXERCISE PROGRAM: Eval: table slides 6/20: Elbow AROM 7/13: Shoulder AA/ROM 8/1: Theraband Scapular Strengthening    ASSESSMENT:   CLINICAL IMPRESSION: A: Pt reporting some decreased pain from previous session. Continues with tight muscle fibers throughout biceps and upper trapezius addressed with manual and discussed self-mobilization at home and the importance of stretching. Pt achieving functional range of motion with  AA/ROM, reporting less discomfort when completing in standing versus supine. With less pain today, able to progress to shoulder strengthening and endurance with functional reach task and scapular strengthening, pt reporting mild pain but able to complete with intermediate trigger point manual therapy to address tight muscles.    PLAN:   OT FREQUENCY: 2x/week  OT DURATION: 8 weeks  PLANNED INTERVENTIONS: self care/ADL training, therapeutic exercise, therapeutic activity, manual therapy, passive range of motion, splinting, electrical stimulation, ultrasound, moist heat, patient/family education, and DME and/or AE instructions  RECOMMENDED OTHER SERVICES: Pt will benefit from  skilled OT services to decrease pain and fascial restrictions, increase joint ROM, strength, and functional use of RUE as dominant.  CONSULTED AND AGREED WITH PLAN OF CARE: Patient  PLAN FOR NEXT SESSION: P: continue with AA/ROM in standing, add proximal shoulder strengthening in standing          Flonnie Hailstone, [place], OTR/L [phone]  10/31/2021, 8:29 AM

## 2021-10-31 NOTE — Patient Instructions (Signed)

## 2021-11-02 ENCOUNTER — Ambulatory Visit (HOSPITAL_COMMUNITY): Payer: BC Managed Care – PPO

## 2021-11-02 ENCOUNTER — Encounter (HOSPITAL_COMMUNITY): Payer: Self-pay

## 2021-11-02 DIAGNOSIS — R29898 Other symptoms and signs involving the musculoskeletal system: Secondary | ICD-10-CM

## 2021-11-02 DIAGNOSIS — M25511 Pain in right shoulder: Secondary | ICD-10-CM

## 2021-11-02 DIAGNOSIS — M25611 Stiffness of right shoulder, not elsewhere classified: Secondary | ICD-10-CM | POA: Diagnosis not present

## 2021-11-02 NOTE — Therapy (Signed)
OUTPATIENT OCCUPATIONAL THERAPY TREATMENT NOTE   Patient Name: Sesilia Poucher MRN: [id number] DOB:25-Dec-1960, 61 y.o., female Today's Date: 11/02/2021  PCP: Dr. [name] REFERRING PROVIDER: Dr. Larena Glassman  Progress Note Reporting Period 09/07/21 to 11/02/21  See note below for Objective Data and Assessment of Progress/Goals.      END OF SESSION:   OT End of Session - 11/02/21 0733     Visit Number 11    Number of Visits 16    Date for OT Re-Evaluation 11/20/21    Authorization Type 1) BCBS Commercial 2) [facility] Medicaid    Authorization Time Period UHC Medicaid: No auth required at this time, 27 visits per year combined PT/OT/SP    Authorization - Visit Number 10    Authorization - Number of Visits 27    OT Start Time 0731    OT Stop Time [phone]    OT Time Calculation (min) 43 min    Activity Tolerance Patient tolerated treatment well    Behavior During Therapy WFL for tasks assessed/performed                  Past Medical History:  Diagnosis Date   Anemia    Arthritis    Blood dyscrasia    sickle cell trait   Cancer ([place])    lymphoma   Carbuncle and furuncle    Hypercholesterolemia    Hypertension    does not take meds   Papanicolaou smear of cervix with positive high risk human papilloma virus (HPV) test 08/23/2020   08/23/20    [place] per ASCCP guidelines, immediate risk of CIN 3+ is 4.1 %   Sickle cell trait ([place])    Past Surgical History:  Procedure Laterality Date   APPENDECTOMY     APPLICATION OF CRANIAL NAVIGATION N/A 07/28/2018   Procedure: APPLICATION OF CRANIAL NAVIGATION;  Surgeon: Kary Kos, MD;  Location: [place];  Service: Neurosurgery;  Laterality: N/A;   ARTHOSCOPIC ROTAOR CUFF REPAIR Right 08/24/2021   Procedure: ARTHROSCOPIC ROTATOR CUFF REPAIR;  Surgeon: Mordecai Rasmussen, MD;  [facility];  Service: Orthopedics;  Laterality: Right;   BRAIN SURGERY  2020   CARPAL TUNNEL RELEASE  03/23/2011   Procedure: CARPAL TUNNEL RELEASE;   Surgeon: Sanjuana Kava;  [facility];  Service: Orthopedics;  Laterality: Left;   CARPAL TUNNEL RELEASE  05/10/2011   Procedure: CARPAL TUNNEL RELEASE;  Surgeon: Sanjuana Kava, MD;  [facility];  Service: Orthopedics;  Laterality: Right;   CESAREAN SECTION     x 2   COLONOSCOPY WITH PROPOFOL N/A 11/16/2020   Procedure: COLONOSCOPY WITH PROPOFOL;  Surgeon: Harvel Quale, MD;  [facility];  Service: Gastroenterology;  Laterality: N/A;  9:15   INCISION AND DRAINAGE PERIRECTAL ABSCESS  12/21/2009   LUMBAR LAMINECTOMY/DECOMPRESSION MICRODISCECTOMY Left 12/12/2020   Procedure: Laminectomy and Foraminotomy - left - L5-S1;  Surgeon: Kary Kos, MD;  Location: [place];  Service: Neurosurgery;  Laterality: Left;  3C   PR DURAL GRAFT REPAIR,SPINE DEFECT N/A 07/28/2018   Procedure: Stereotactic open biopsy of Right cerebellar hemisphere and dura with brainlab;  Surgeon: Kary Kos, MD;  Location: [place];  Service: Neurosurgery;  Laterality: N/A;  Stereotactic open biopsy of Right cerebellar hemisphere and dura with brainlab   PROCTOSCOPY  10/17/2010   Procedure: PROCTOSCOPY;  Surgeon: Scherry Ran;  [facility];  Service: General;  Laterality: N/A;  Rigid Proctoscopy/Possible Fistula in Ano  Procedure ended at 1003   SHOULDER ARTHROSCOPY WITH  BICEPSTENOTOMY Right 08/24/2021   Procedure: SHOULDER ARTHROSCOPY WITH BICEPSTENOTOMY;  Surgeon: Mordecai Rasmussen, MD;  [facility];  Service: Orthopedics;  Laterality: Right;   THERAPEUTIC ABORTION     x2   Patient Active Problem List   Diagnosis Date Noted   Hidradenitis suppurativa 08/18/2021   Spinal stenosis of lumbar region 12/12/2020   Papanicolaou smear of cervix with positive high risk human papilloma virus (HPV) test 08/23/2020   Hemorrhoids 08/16/2020   Encounter for screening fecal occult blood testing 08/16/2020   Encounter for gynecological examination with Papanicolaou smear of cervix 08/16/2020    Allergic reaction to adhesive 08/16/2020   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   Counseling regarding advance care planning and goals of care 10/23/2018   Diffuse large B cell lymphoma ([place]) 08/11/2018   Brain tumor ([place]) 07/28/2018   ANA positive 03/24/2018   Carpal tunnel syndrome on both sides 03/24/2018   Chronic pain of both shoulders 03/24/2018   Hidradenitis 07/31/2016   Recurrent boils 07/31/2016   Hyperlipidemia 06/15/2015   Pain of left hand 05/19/2015   Hand pain, right 05/19/2015   Hematuria 03/03/2015   Esophageal reflux 03/03/2015   Essential hypertension, benign 03/03/2015   Bronchitis due to tobacco use 10/17/2010    ONSET DATE: 08/24/21  REFERRING DIAG: s/p right RCR  THERAPY DIAG:  Stiffness of right shoulder, not elsewhere classified - Plan: Ot plan of care cert/re-cert  Acute pain of right shoulder - Plan: Ot plan of care cert/re-cert  Other symptoms and signs involving the musculoskeletal system - Plan: Ot plan of care cert/re-cert  Rationale for Evaluation and Treatment Rehabilitation  PERTINENT HISTORY: Pt is a 61 y/o female s/p right RCR on 08/24/21, presenting with RUE functional deficits limiting ability to perform ADLs using RUE as dominant. Presents in sling, pt was referred to occupational therapy for evaluation and treatment by Dr. Larena Glassman.   PRECAUTIONS: see protocol   WEIGHT BEARING RESTRICTIONS Yes NWB  SUBJECTIVE: S: "I can do most of what I need to do, I just have so much pain."   PAIN:  Are you having pain? Yes: NPRS scale: 7/10 Pain location: anterior shoulder Pain description: deep, in the bone Aggravating factors: movement  Relieving factors: rest, ice  Pain is worse at night     PATIENT GOALS To be able to use her right arm during daily tasks.  OBJECTIVE:   HAND DOMINANCE: Right  FUNCTIONAL OUTCOME MEASURES: FOTO: 4/100 8/3/: FOTO: 52.92   UPPER EXTREMITY ROM        Assessed seated, er/IR  adducted Passive ROM Right eval Right 10/12/21 Right 11/02/21  Shoulder flexion 62 155 161  Shoulder abduction 45 120 126  Shoulder internal rotation 90 90 90  Shoulder external rotation 51 45 75  (Blank rows = not tested)  Active ROM Right eval Right 10/12/21 Right 11/02/21  Shoulder flexion  120 146  Shoulder abduction   73 118  Shoulder internal rotation  90 90  Shoulder external rotation  45 73       UPPER EXTREMITY MMT:      Unable to assess due to precautions MMT Right eval Right  10/12/21 Right 11/02/21  Shoulder flexion   3/5 4/5  Shoulder abduction   3/5 3+/5  Shoulder internal rotation   3/5 4+/5  Shoulder external rotation   3/5 4/5  (Blank rows = not tested)          GOALS: Goals reviewed with patient? Yes  SHORT TERM GOALS: Target date: 10/05/2021     Pt will be provided with HEP to improve mobility of RUE required for ADL completion.    Goal status: MET   2.  Pt will increase RUE P/ROM to [facility] to improve ability to perform dressing tasks with minimal compensatory strategies and techniques.    Goal status: MET   3.  Pt will increase RUE strength to 3/5 or greater to improve ability to reach for items at waist to chest height during functional tasks.    Goal status: MET       LONG TERM GOALS: Target date: 11/02/2021     Pt will return to highest level of functioning using RUE as dominant during ADL and work tasks.    Goal status: Ongoing   2.  Pt will decrease pain in RUE to 3/10 or less to improve ability to use RUE as dominant during housework tasks.    Goal status: Ongoing   3.  Pt will decrease RUE fascial restrictions to min amounts or less to improve mobility required for functional reaching tasks at home and work.    Goal status: Ongoing   4.  Pt will increase RUE A/ROM to [facility] to improve ability to reaching overhead and behind back during dressing and bathing tasks.    Goal status: Ongoing   5.  Pt will increase RUE strength to 4+/5 or  greater to improve ability to perform lifting tasks at work.    Goal status: Ongoing    TODAY'S TREATMENT:  11/02/21 -P/ROM: 1x5 each movement flexion, abduction, IR/er -A/ROM: 1x5 each movement flexion, abduction, IR/er  -Manual therapy: soft tissue mobilization and myofascial release to right serratus anterior, subscapularis, and trapezius regions to address pain and fascial restrictions and increase joint ROM.  -Wall wash, 1x5 forward flexion, 1x5 abduction  -Functional Reach: Removing items from middle shelf, top shelf items to middle shelf, counter items to top shelf -UBE: Level 1, 2 minutes, 2.5 speed -Scapular theraband:red, row, extension, 10 reps each   10/31/21 -Manual therapy: soft tissue mobilization and myofascial release to right bicep, deltoid, and trapezius regions to address pain and fascial restrictions and increase joint ROM.  -AA/ROM: Supine, 1x5, flexion, protraction, abduction, horizontal abduction, IR/er  -AA/ROM: Standing, 1x5, flexion, protraction, abduction, horizontal abduction, IR/er  -Wall wash, 1' flexion -Scapular theraband:red, row, extension, 10 reps each -UBE: Level 1, 3' forward 3' reverse, pace: 4.0 -Functional Reach: Pt reaching to top shelf, removing all objects, moving middle shelf objects to top shelf, items from counter to top shelf  10/25/21 --Manual therapy: soft tissue mobilization and myofascial release to right bicep, deltoid, and trapezius regions to address pain and fascial restrictions and increase joint ROM.  -P/ROM: supine, shoulder flexion, abduction, er/IR, horizontal abduction, 10 reps each. Rest breaks taken due to high levels of pain     PATIENT EDUCATION: Education details: English as a second language teacher  Person educated: Patient Education method: Explanation, Demonstration, handout  Education comprehension: verbalized understanding, returned demonstration      HOME EXERCISE PROGRAM: Eval: table slides 6/20: Elbow AROM 7/13:  Shoulder AA/ROM 8/1: Theraband Scapular Strengthening    ASSESSMENT:   CLINICAL IMPRESSION: A: Measurements taken for progress note today with noted improvements with RUE AROM and strength. Pt reports functional improvements, with pain as the limiting factor. She is attending pain management once a month. Muscle spasm and pain with abduction today, therapist providing manual intermittently to address and allow pt to continue with exercises. Less rest required for  functional reach exercise today, improved shoulder endurance. Pt continues with some decreased RUE strength and ROM as well as increased pain and would continue to benefit from skilled OT to address deficits.      PLAN:   OT FREQUENCY: 2x/week  OT DURATION: 8 weeks  PLANNED INTERVENTIONS: self care/ADL training, therapeutic exercise, therapeutic activity, manual therapy, passive range of motion, splinting, electrical stimulation, ultrasound, moist heat, patient/family education, and DME and/or AE instructions  RECOMMENDED OTHER SERVICES: Pt will benefit from skilled OT services to decrease pain and fascial restrictions, increase joint ROM, strength, and functional use of RUE as dominant.  CONSULTED AND AGREED WITH PLAN OF CARE: Patient  PLAN FOR NEXT SESSION: P: continue with AA/ROM in standing, add proximal shoulder strengthening in standing          Flonnie Hailstone, [place], OTR/L [phone]  11/02/2021, 5:02 PM

## 2021-11-03 ENCOUNTER — Telehealth: Payer: Self-pay | Admitting: Hematology

## 2021-11-03 NOTE — Telephone Encounter (Signed)
Scheduled follow-up appointment per 7/31 los. Patient is aware.

## 2021-11-04 ENCOUNTER — Other Ambulatory Visit: Payer: Self-pay

## 2021-11-05 ENCOUNTER — Encounter: Payer: Self-pay | Admitting: Hematology

## 2021-11-05 NOTE — Progress Notes (Signed)
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  CURRENT THERAPY: maintenance Rituxan q8weeks  INTERVAL HISTORY:  Amber Drake is here for continued evaluation and management of extranodal marginal zone lymphoma. Patient notes that she is recovering from her rotator cuff surgery done on 08/24/2021.  Other than some shoulder pain she is not noting any other acute new symptoms. No new headaches change in vision or new focal neurological deficits. No new back pains. No new lumps or bumps. No fevers no chills no night sweats no unexpected weight loss. Labs done today were reviewed in detail with the patient.   MEDICAL HISTORY: Past Medical History:  Diagnosis Date   Anemia    Arthritis    Blood dyscrasia    sickle cell trait   Cancer ([place])    lymphoma   Carbuncle and furuncle    Hypercholesterolemia    Hypertension    does not take meds   Papanicolaou smear of cervix with positive high risk human papilloma virus (HPV) test 08/23/2020   08/23/20    [place] per ASCCP guidelines, immediate risk of CIN 3+ is 4.1 %   Sickle cell trait ([facility])     ALLERGIES:  is allergic to penicillins, penicillins cross reactors, and tape.  MEDICATIONS:  Current Outpatient Medications  Medication Sig Dispense Refill   atorvastatin (LIPITOR) 20 MG tablet Take 1 tablet (20 mg total) by mouth daily. 90 tablet 2   doxycycline (VIBRAMYCIN) 100 MG capsule Take 1 capsule (100 mg total) by mouth 2 (two) times daily. 20 capsule 0   lisinopril-hydrochlorothiazide (PRINZIDE,ZESTORETIC) 20-12.5 MG tablet Take 1 tablet by mouth daily.     meclizine (ANTIVERT) 12.5 MG tablet Take 1 tablet (12.5 mg total) by mouth 3 (three) times daily as needed for dizziness. 8 tablet 0   Multiple Vitamin  (MULTIVITAMIN WITH MINERALS) TABS tablet Take 1 tablet by mouth daily.     oxyCODONE (OXY IR/ROXICODONE) 5 MG immediate release tablet Take 5 mg by mouth every 6 (six) hours as needed.     potassium chloride SA (KLOR-CON) 20 MEQ tablet TAKE (1) TABLET BY MOUTH TWICE DAILY. 60 tablet 1   silver sulfADIAZINE (SILVADENE) 1 % cream Apply 1 application. topically daily. 50 g 0   No current facility-administered medications for this visit.   Facility-Administered Medications Ordered in Other Visits  Medication Dose Route Frequency Provider Last Rate Last Admin   acetaminophen (TYLENOL) tablet 500 mg  500 mg Oral Once Brunetta Genera, MD       dexamethasone (DECADRON) 10 mg in sodium chloride 0.9 % 50 mL IVPB  10 mg Intravenous Once Brunetta Genera, MD        SURGICAL HISTORY:  Past Surgical History:  Procedure Laterality Date   APPENDECTOMY     APPLICATION OF CRANIAL NAVIGATION N/A 07/28/2018   Procedure: APPLICATION OF CRANIAL NAVIGATION;  Surgeon: Kary Kos, MD;  Location: [facility];  Service: Neurosurgery;  Laterality: N/A;   ARTHOSCOPIC ROTAOR CUFF REPAIR Right 08/24/2021   Procedure: ARTHROSCOPIC ROTATOR CUFF REPAIR;  Surgeon: Mordecai Rasmussen, MD;  [facility];  Service: Orthopedics;  Laterality: Right;   BRAIN SURGERY  2020   CARPAL TUNNEL RELEASE  03/23/2011   Procedure: CARPAL TUNNEL RELEASE;  Surgeon: Sanjuana Kava;  [facility];  Service: Orthopedics;  Laterality: Left;  CARPAL TUNNEL RELEASE  05/10/2011   Procedure: CARPAL TUNNEL RELEASE;  Surgeon: Sanjuana Kava, MD;  [facility];  Service: Orthopedics;  Laterality: Right;   CESAREAN SECTION     x 2   COLONOSCOPY WITH PROPOFOL N/A 11/16/2020   Procedure: COLONOSCOPY WITH PROPOFOL;  Surgeon: Harvel Quale, MD;  [facility];  Service: Gastroenterology;  Laterality: N/A;  9:15   INCISION AND DRAINAGE PERIRECTAL ABSCESS  12/21/2009   LUMBAR LAMINECTOMY/DECOMPRESSION MICRODISCECTOMY Left  12/12/2020   Procedure: Laminectomy and Foraminotomy - left - L5-S1;  Surgeon: Kary Kos, MD;  Location: [facility];  Service: Neurosurgery;  Laterality: Left;  3C   PR DURAL GRAFT REPAIR,SPINE DEFECT N/A 07/28/2018   Procedure: Stereotactic open biopsy of Right cerebellar hemisphere and dura with brainlab;  Surgeon: Kary Kos, MD;  Location: [facility];  Service: Neurosurgery;  Laterality: N/A;  Stereotactic open biopsy of Right cerebellar hemisphere and dura with brainlab   PROCTOSCOPY  10/17/2010   Procedure: PROCTOSCOPY;  Surgeon: Scherry Ran;  [facility];  Service: General;  Laterality: N/A;  Rigid Proctoscopy/Possible Fistula in Ano  Procedure ended at [place] Right 08/24/2021   Procedure: SHOULDER ARTHROSCOPY WITH BICEPSTENOTOMY;  Surgeon: Mordecai Rasmussen, MD;  [facility];  Service: Orthopedics;  Laterality: Right;   THERAPEUTIC ABORTION     x2    REVIEW OF SYSTEMS:   10 Point review of Systems was done is negative except as noted above.   PHYSICAL EXAMINATION:  Blood pressure 121/67, pulse 87, temperature 97.8 F (36.6 C), temperature source Temporal, resp. rate 17, height '5\' 5"'$  (1.651 m), weight 170 lb 12.8 oz (77.5 kg), last menstrual period 07/18/2010, SpO2 97 %. NAD GENERAL:alert, in no acute distress and comfortable SKIN: no acute rashes, no significant lesions EYES: conjunctiva are pink and non-injected, sclera anicteric OROPHARYNX: MMM, no exudates, no oropharyngeal erythema or ulceration NECK: supple, no JVD LYMPH:  no palpable lymphadenopathy in the cervical, axillary or inguinal regions LUNGS: clear to auscultation b/l with normal respiratory effort HEART: regular rate & rhythm ABDOMEN:  normoactive bowel sounds , non tender, not distended. Extremity: no pedal edema PSYCH: alert & oriented x 3 with fluent speech NEURO: no focal motor/sensory deficits   LABORATORY DATA:     Latest Ref Rng & Units 10/30/2021    10:20 AM 08/22/2021    8:52 AM 08/08/2021    9:04 AM  CBC  WBC 4.0 - 10.5 K/uL 6.7  6.2  7.1   Hemoglobin 12.0 - 15.0 g/dL 12.3  11.7  12.8   Hematocrit 36.0 - 46.0 % 36.2  37.0  38.2   Platelets 150 - 400 K/uL 375  439  376     .    Latest Ref Rng & Units 10/30/2021   10:20 AM 08/22/2021    8:52 AM 08/08/2021    9:04 AM  CMP  Glucose 70 - 99 mg/dL 105  63  67   BUN 6 - 20 mg/dL '13  12  10   '$ Creatinine 0.44 - 1.00 mg/dL 0.71  1.03  0.77   Sodium 135 - 145 mmol/L 141  141  140   Potassium 3.5 - 5.1 mmol/L 4.1  4.4  4.0   Chloride 98 - 111 mmol/L 104  105  104   CO2 22 - 32 mmol/L 33  27  28   Calcium 8.9 - 10.3 mg/dL 10.2  10.1  10.0   Total Protein 6.5 -  8.1 g/dL 7.1   7.2   Total Bilirubin 0.3 - 1.2 mg/dL 0.4   0.5   Alkaline Phos 38 - 126 U/L 70   63   AST 15 - 41 U/L 18   17   ALT 0 - 44 U/L 16   19     . Lab Results  Component Value Date   LDH 148 10/30/2021    RADIOGRAPHIC STUDIES:  DG Abdomen 1 View  Result Date: 10/26/2021 CLINICAL DATA:  Constipation EXAM: ABDOMEN - 1 VIEW COMPARISON:  CT 04/20/2021 FINDINGS: The bowel gas pattern is normal. No radio-opaque calculi or other significant radiographic abnormality are seen. Only mild stool in the colon. Phleboliths in the pelvis IMPRESSION: Negative. Electronically Signed   By: Donavan Foil M.D.   On: 10/26/2021 18:11     ASSESSMENT/PLAN:   61 y.o. female with   1. 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 The process appears to extend into the upper cervical canal. Mild  obstructive hydrocephalus. Differential includes tumor including metastatic disease and lymphoma. Chronic inflammatory process such as sarcoid is a consideration however chest x-ray negative. TB and atypical infection/fungus also possible. 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 2. Nonspecific focus of increased uptake within the cord extending from T12 to L1. Cannot rule out additional site of CNS lymphoma. Consider further evaluation with contrast enhanced MRI through this area. 3. Aortic Atherosclerosis and Emphysema."   10/16/2018 MRI brain w and w/o contrast revealed "1. Resolved dural mass in the right posterior fossa with minimal smooth dural thickening that may be treatment related. Cerebellar edema and mass effect is also resolved. No new site of disease. 2. 6 mm colloid cyst."   10/20/2018 MRI cervical spine w and w/o contrast revealed "Negative for lymphoma.  No acute abnormality. Central disc protrusion at C5-6 effaces the ventral thecal sac causing mild central canal narrowing. There is also a shallow disc bulge at C6-7 which narrows but does not efface the ventral thecal Sac."   01/13/2019 MRI Brain ([id number]) revealed "1. Stable and satisfactory post treatment appearance of the posterior fossa. 2. Stable small 6 mm colloid cyst. 3. No new intracranial abnormality."   05/06/2019 MRI Cervical Spine ([id number]) revealed "No evidence of lymphoma in the cervical spine. Previously noted dural enhancement in the posterior fossa and cervical canal has resolved. No cord compression or cord lesion. Chronic cervical spine degenerative changes are stable from the prior study."   12/15/2019 MRI Brain ([id number]) revealed "No  acute intracranial abnormality. No enhancing mass lesion 5 mm colloid cyst in the third ventricle is chronic and unchanged from prior studies."  08/14/2020, MR Brain ([id number]) which revealed "1. Unchanged appearance of the brain. No evidence of recurrent intracranial lymphoma. 2. Small colloid cyst without hydrocephalus."   06/22/2021 MRI brain- Stable MRI. No recurrent mass in the right posterior fossa  at the site of prior craniotomy.   PLAN: -Patient's lab results done today were discussed in detail with her CBC within normal limits CMP unremarkable LDH normal Patient has no lab or clinical findings suggestive of progression of her marginal zone lymphoma. She has no new neuro symptoms to suggest localized progression in her meninges. She prefers to continue to be of her maintenance Rituxan at this time. We shall repeat screening MRI of the brain in 14 weeks and see her back in 4 months  FOLLOW UP: MRI brain in 14 weeks Return to clinic with Dr. Irene Limbo with labs in 4 months  The total time spent in the appointment was 21 minutes*.  All of the patient's questions were answered with apparent satisfaction. The patient knows to call the clinic with any problems, questions or concerns.   Sullivan Lone MD MS AAHIVMS [facility] Hematology/Oncology Physician [facility]  .*Total Encounter Time as defined by the Centers for Medicare and Medicaid Services includes, in addition to the face-to-face time of a patient visit (documented in the note above) non-face-to-face time: obtaining and reviewing outside history, ordering and reviewing medications, tests or procedures, care coordination (communications with other health care professionals or caregivers) and documentation in the medical record.

## 2021-11-06 ENCOUNTER — Ambulatory Visit (HOSPITAL_COMMUNITY): Payer: BC Managed Care – PPO | Admitting: Occupational Therapy

## 2021-11-06 ENCOUNTER — Other Ambulatory Visit: Payer: Self-pay

## 2021-11-06 ENCOUNTER — Encounter (HOSPITAL_COMMUNITY): Payer: Self-pay | Admitting: Occupational Therapy

## 2021-11-06 DIAGNOSIS — M25611 Stiffness of right shoulder, not elsewhere classified: Secondary | ICD-10-CM | POA: Diagnosis not present

## 2021-11-06 DIAGNOSIS — M25511 Pain in right shoulder: Secondary | ICD-10-CM | POA: Diagnosis not present

## 2021-11-06 DIAGNOSIS — R29898 Other symptoms and signs involving the musculoskeletal system: Secondary | ICD-10-CM | POA: Diagnosis not present

## 2021-11-06 NOTE — Patient Instructions (Signed)

## 2021-11-06 NOTE — Therapy (Signed)
OUTPATIENT OCCUPATIONAL THERAPY TREATMENT NOTE   Patient Name: Amber Drake MRN: [id number] DOB:09-28-1960, 61 y.o., female Today's Date: 11/06/2021  PCP: Dr. [name] REFERRING PROVIDER: Dr. Larena Glassman    END OF SESSION:   OT End of Session - 11/06/21 0824     Visit Number 12    Number of Visits 16    Date for OT Re-Evaluation 11/20/21    Authorization Type 1) BCBS Commercial 2) [facility] Medicaid    Authorization Time Period UHC Medicaid: No auth required at this time, 27 visits per year combined PT/OT/SP    Authorization - Visit Number 11    Authorization - Number of Visits 27    OT Start Time [phone]    OT Stop Time 0824    OT Time Calculation (min) 42 min    Activity Tolerance Patient tolerated treatment well    Behavior During Therapy WFL for tasks assessed/performed                   Past Medical History:  Diagnosis Date   Anemia    Arthritis    Blood dyscrasia    sickle cell trait   Cancer ([place])    lymphoma   Carbuncle and furuncle    Hypercholesterolemia    Hypertension    does not take meds   Papanicolaou smear of cervix with positive high risk human papilloma virus (HPV) test 08/23/2020   08/23/20    [place] per ASCCP guidelines, immediate risk of CIN 3+ is 4.1 %   Sickle cell trait ([place])    Past Surgical History:  Procedure Laterality Date   APPENDECTOMY     APPLICATION OF CRANIAL NAVIGATION N/A 07/28/2018   Procedure: APPLICATION OF CRANIAL NAVIGATION;  Surgeon: Kary Kos, MD;  Location: [facility];  Service: Neurosurgery;  Laterality: N/A;   ARTHOSCOPIC ROTAOR CUFF REPAIR Right 08/24/2021   Procedure: ARTHROSCOPIC ROTATOR CUFF REPAIR;  Surgeon: Mordecai Rasmussen, MD;  [facility];  Service: Orthopedics;  Laterality: Right;   BRAIN SURGERY  2020   CARPAL TUNNEL RELEASE  03/23/2011   Procedure: CARPAL TUNNEL RELEASE;  Surgeon: Sanjuana Kava;  [facility];  Service: Orthopedics;  Laterality: Left;   CARPAL TUNNEL RELEASE  05/10/2011    Procedure: CARPAL TUNNEL RELEASE;  Surgeon: Sanjuana Kava, MD;  [facility];  Service: Orthopedics;  Laterality: Right;   CESAREAN SECTION     x 2   COLONOSCOPY WITH PROPOFOL N/A 11/16/2020   Procedure: COLONOSCOPY WITH PROPOFOL;  Surgeon: Harvel Quale, MD;  [facility];  Service: Gastroenterology;  Laterality: N/A;  9:15   INCISION AND DRAINAGE PERIRECTAL ABSCESS  12/21/2009   LUMBAR LAMINECTOMY/DECOMPRESSION MICRODISCECTOMY Left 12/12/2020   Procedure: Laminectomy and Foraminotomy - left - L5-S1;  Surgeon: Kary Kos, MD;  Location: [facility];  Service: Neurosurgery;  Laterality: Left;  3C   PR DURAL GRAFT REPAIR,SPINE DEFECT N/A 07/28/2018   Procedure: Stereotactic open biopsy of Right cerebellar hemisphere and dura with brainlab;  Surgeon: Kary Kos, MD;  Location: [facility];  Service: Neurosurgery;  Laterality: N/A;  Stereotactic open biopsy of Right cerebellar hemisphere and dura with brainlab   PROCTOSCOPY  10/17/2010   Procedure: PROCTOSCOPY;  Surgeon: Scherry Ran;  [facility];  Service: General;  Laterality: N/A;  Rigid Proctoscopy/Possible Fistula in Ano  Procedure ended at [place] Right 08/24/2021   Procedure: SHOULDER ARTHROSCOPY WITH BICEPSTENOTOMY;  Surgeon: Mordecai Rasmussen, MD;  [facility];  Service: Orthopedics;  Laterality: Right;   THERAPEUTIC ABORTION     x2   Patient Active Problem List   Diagnosis Date Noted   Hidradenitis suppurativa 08/18/2021   Spinal stenosis of lumbar region 12/12/2020   Papanicolaou smear of cervix with positive high risk human papilloma virus (HPV) test 08/23/2020   Hemorrhoids 08/16/2020   Encounter for screening fecal occult blood testing 08/16/2020   Encounter for gynecological examination with Papanicolaou smear of cervix 08/16/2020   Allergic reaction to adhesive 08/16/2020   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[place]) 10/23/2018   Counseling regarding  advance care planning and goals of care 10/23/2018   Diffuse large B cell lymphoma ([place]) 08/11/2018   Brain tumor ([place]) 07/28/2018   ANA positive 03/24/2018   Carpal tunnel syndrome on both sides 03/24/2018   Chronic pain of both shoulders 03/24/2018   Hidradenitis 07/31/2016   Recurrent boils 07/31/2016   Hyperlipidemia 06/15/2015   Pain of left hand 05/19/2015   Hand pain, right 05/19/2015   Hematuria 03/03/2015   Esophageal reflux 03/03/2015   Essential hypertension, benign 03/03/2015   Bronchitis due to tobacco use 10/17/2010    ONSET DATE: 08/24/21  REFERRING DIAG: s/p right RCR  THERAPY DIAG:  Stiffness of right shoulder, not elsewhere classified  Acute pain of right shoulder  Other symptoms and signs involving the musculoskeletal system  Rationale for Evaluation and Treatment Rehabilitation  PERTINENT HISTORY: Pt is a 61 y/o female s/p right RCR on 08/24/21, presenting with RUE functional deficits limiting ability to perform ADLs using RUE as dominant. Presents in sling, pt was referred to occupational therapy for evaluation and treatment by Dr. Larena Glassman.   PRECAUTIONS: see protocol   WEIGHT BEARING RESTRICTIONS Yes NWB  SUBJECTIVE: S: It's hurting down in it today, pretty bad.    PAIN:  Are you having pain? Yes: NPRS scale: 8/10 Pain location: anterior shoulder Pain description: deep, in the bone Aggravating factors: movement  Relieving factors: rest, ice  Pain is worse at night     PATIENT GOALS To be able to use her right arm during daily tasks.  OBJECTIVE:   HAND DOMINANCE: Right  FUNCTIONAL OUTCOME MEASURES: FOTO: 4/100 8/3/: FOTO: 52.92   UPPER EXTREMITY ROM        Assessed seated, er/IR adducted Passive ROM Right eval Right 10/12/21 Right 11/02/21  Shoulder flexion 62 155 161  Shoulder abduction 45 120 126  Shoulder internal rotation 90 90 90  Shoulder external rotation 51 45 75  (Blank rows = not tested)  Active ROM Right eval  Right 10/12/21 Right 11/02/21  Shoulder flexion  120 146  Shoulder abduction   73 118  Shoulder internal rotation  90 90  Shoulder external rotation  45 73       UPPER EXTREMITY MMT:      Unable to assess due to precautions MMT Right eval Right  10/12/21 Right 11/02/21  Shoulder flexion   3/5 4/5  Shoulder abduction   3/5 3+/5  Shoulder internal rotation   3/5 4+/5  Shoulder external rotation   3/5 4/5  (Blank rows = not tested)          GOALS: Goals reviewed with patient? Yes   SHORT TERM GOALS: Target date: 10/05/2021     Pt will be provided with HEP to improve mobility of RUE required for ADL completion.    Goal status: MET   2.  Pt will increase RUE P/ROM to [facility] to improve ability to perform dressing  tasks with minimal compensatory strategies and techniques.    Goal status: MET   3.  Pt will increase RUE strength to 3/5 or greater to improve ability to reach for items at waist to chest height during functional tasks.    Goal status: MET       LONG TERM GOALS: Target date: 11/02/2021     Pt will return to highest level of functioning using RUE as dominant during ADL and work tasks.    Goal status: Ongoing   2.  Pt will decrease pain in RUE to 3/10 or less to improve ability to use RUE as dominant during housework tasks.    Goal status: Ongoing   3.  Pt will decrease RUE fascial restrictions to min amounts or less to improve mobility required for functional reaching tasks at home and work.    Goal status: Ongoing   4.  Pt will increase RUE A/ROM to [facility] to improve ability to reaching overhead and behind back during dressing and bathing tasks.    Goal status: Ongoing   5.  Pt will increase RUE strength to 4+/5 or greater to improve ability to perform lifting tasks at work.    Goal status: Ongoing    TODAY'S TREATMENT: 11/06/21  -Manual therapy: soft tissue mobilization and myofascial release to right serratus anterior, subscapularis, and trapezius regions to  address pain and fascial restrictions and increase joint ROM -P/ROM: 1x5 each movement flexion, abduction, IR/er -A/ROM: supine-protraction, flexion, horizontal abduction, er/IR, abduction, 10 reps -Proximal shoulder strengthening: supine, paddles, criss cross, circles each direction, 10 reps each -A/ROM: standing-protraction, flexion, horizontal abduction, er/IR, abduction, 10 reps -Proximal shoulder strengthening: standing-paddles, criss cross, circles each direction, 10 reps each -Overhead lacing: seated, lacing from top down then removing -Ball pass: 10 reps behind back for IR using tennis ball   11/02/21 -P/ROM: 1x5 each movement flexion, abduction, IR/er -A/ROM: 1x5 each movement flexion, abduction, IR/er  -Manual therapy: soft tissue mobilization and myofascial release to right serratus anterior, subscapularis, and trapezius regions to address pain and fascial restrictions and increase joint ROM.  -Wall wash, 1x5 forward flexion, 1x5 abduction  -Functional Reach: Removing items from middle shelf, top shelf items to middle shelf, counter items to top shelf -UBE: Level 1, 2 minutes, 2.5 speed -Scapular theraband:red, row, extension, 10 reps each   10/31/21 -Manual therapy: soft tissue mobilization and myofascial release to right bicep, deltoid, and trapezius regions to address pain and fascial restrictions and increase joint ROM.  -AA/ROM: Supine, 1x5, flexion, protraction, abduction, horizontal abduction, IR/er  -AA/ROM: Standing, 1x5, flexion, protraction, abduction, horizontal abduction, IR/er  -Wall wash, 1' flexion -Scapular theraband:red, row, extension, 10 reps each -UBE: Level 1, 3' forward 3' reverse, pace: 4.0 -Functional Reach: Pt reaching to top shelf, removing all objects, moving middle shelf objects to top shelf, items from counter to top shelf       PATIENT EDUCATION: Education details: A/ROM Person educated: Patient Education method: Consulting civil engineer, Media planner,  handout  Education comprehension: verbalized understanding, returned demonstration      HOME EXERCISE PROGRAM: Eval: table slides 6/20: Elbow AROM 7/13: Shoulder AA/ROM 8/1: Theraband Scapular Strengthening  8/7: A/ROM    ASSESSMENT:   CLINICAL IMPRESSION: A: Pt reports continued deep ache today. Continued with myofascial release and passive stretching. Pt completing A/ROM in supine and standing. Pt demonstrating ROM WFL during tasks, verbal cuing for breathing techniques to assist with keeping smooth and fluid motion. Added proximal shoulder strengthening in standing, overhead lacing, and ball pass.  Good IR behind back with ball pass, min difficulty with activity. Verbal cuing for form and technique, discussed importance of daily HEP completion, updated for A/ROM.     PLAN:   OT FREQUENCY: 2x/week  OT DURATION: 8 weeks  PLANNED INTERVENTIONS: self care/ADL training, therapeutic exercise, therapeutic activity, manual therapy, passive range of motion, splinting, electrical stimulation, ultrasound, moist heat, patient/family education, and DME and/or AE instructions  RECOMMENDED OTHER SERVICES: Pt will benefit from skilled OT services to decrease pain and fascial restrictions, increase joint ROM, strength, and functional use of RUE as dominant.  CONSULTED AND AGREED WITH PLAN OF CARE: Patient  PLAN FOR NEXT SESSION: P: continue with AA/ROM in standing, add proximal shoulder strengthening in standing         Guadelupe Sabin, OTR/L  [phone]  11/06/2021, 8:25 AM

## 2021-11-08 ENCOUNTER — Encounter (HOSPITAL_COMMUNITY): Payer: BC Managed Care – PPO

## 2021-11-13 ENCOUNTER — Encounter (HOSPITAL_COMMUNITY): Payer: BC Managed Care – PPO

## 2021-11-15 ENCOUNTER — Encounter (HOSPITAL_COMMUNITY): Payer: Self-pay

## 2021-11-15 ENCOUNTER — Ambulatory Visit (HOSPITAL_COMMUNITY): Payer: BC Managed Care – PPO

## 2021-11-15 DIAGNOSIS — M25611 Stiffness of right shoulder, not elsewhere classified: Secondary | ICD-10-CM | POA: Diagnosis not present

## 2021-11-15 DIAGNOSIS — R29898 Other symptoms and signs involving the musculoskeletal system: Secondary | ICD-10-CM | POA: Diagnosis not present

## 2021-11-15 DIAGNOSIS — M25511 Pain in right shoulder: Secondary | ICD-10-CM | POA: Diagnosis not present

## 2021-11-15 NOTE — Therapy (Signed)
OUTPATIENT OCCUPATIONAL THERAPY TREATMENT NOTE   Patient Name: Amber Drake MRN: [id number] DOB:1961/02/14, 61 y.o., female Today's Date: 11/15/2021  PCP: Dr. [name] REFERRING PROVIDER: Dr. Larena Glassman    END OF SESSION:   OT End of Session - 11/15/21 0754     Visit Number 13    Number of Visits 16    Date for OT Re-Evaluation 11/20/21    Authorization Type 1) BCBS Commercial 2) [facility] Medicaid    Authorization Time Period U[employer] Medicaid: No auth required at this time, 27 visits per year combined PT/OT/SP    Authorization - Visit Number 12    Authorization - Number of Visits 27    OT Start Time 0730    OT Stop Time 0810    OT Time Calculation (min) 40 min    Activity Tolerance Patient tolerated treatment well    Behavior During Therapy WFL for tasks assessed/performed                    Past Medical History:  Diagnosis Date   Anemia    Arthritis    Blood dyscrasia    sickle cell trait   Cancer ([place])    lymphoma   Carbuncle and furuncle    Hypercholesterolemia    Hypertension    does not take meds   Papanicolaou smear of cervix with positive high risk human papilloma virus (HPV) test 08/23/2020   08/23/20    [place] per ASCCP guidelines, immediate risk of CIN 3+ is 4.1 %   Sickle cell trait ([place])    Past Surgical History:  Procedure Laterality Date   APPENDECTOMY     APPLICATION OF CRANIAL NAVIGATION N/A 07/28/2018   Procedure: APPLICATION OF CRANIAL NAVIGATION;  Surgeon: Kary Kos, MD;  Location: [place];  Service: Neurosurgery;  Laterality: N/A;   ARTHOSCOPIC ROTAOR CUFF REPAIR Right 08/24/2021   Procedure: ARTHROSCOPIC ROTATOR CUFF REPAIR;  Surgeon: Mordecai Rasmussen, MD;  Location[facility];  Service: Orthopedics;  Laterality: Right;   BRAIN SURGERY  2020   CARPAL TUNNEL RELEASE  03/23/2011   Procedure: CARPAL TUNNEL RELEASE;  Surgeon: Sanjuana Kava;  Location: [facility];  Service: Orthopedics;  Laterality: Left;   CARPAL TUNNEL RELEASE  05/10/2011    Procedure: CARPAL TUNNEL RELEASE;  Surgeon: Sanjuana Kava, MD;  Location: [facility];  Service: Orthopedics;  Laterality: Right;   CESAREAN SECTION     x 2   COLONOSCOPY WITH PROPOFOL N/A 11/16/2020   Procedure: COLONOSCOPY WITH PROPOFOL;  Surgeon: Harvel Quale, MD;  [facility];  Service: Gastroenterology;  Laterality: N/A;  9:15   INCISION AND DRAINAGE PERIRECTAL ABSCESS  12/21/2009   LUMBAR LAMINECTOMY/DECOMPRESSION MICRODISCECTOMY Left 12/12/2020   Procedure: Laminectomy and Foraminotomy - left - L5-S1;  Surgeon: Kary Kos, MD;  Location: [place];  Service: Neurosurgery;  Laterality: Left;  3C   PR DURAL GRAFT REPAIR,SPINE DEFECT N/A 07/28/2018   Procedure: Stereotactic open biopsy of Right cerebellar hemisphere and dura with brainlab;  Surgeon: Kary Kos, MD;  Location: [place];  Service: Neurosurgery;  Laterality: N/A;  Stereotactic open biopsy of Right cerebellar hemisphere and dura with brainlab   PROCTOSCOPY  10/17/2010   Procedure: PROCTOSCOPY;  Surgeon: Scherry Ran;  Location: [facility];  Service: General;  Laterality: N/A;  Rigid Proctoscopy/Possible Fistula in Ano  Procedure ended at [place] Right 08/24/2021   Procedure: SHOULDER ARTHROSCOPY WITH BICEPSTENOTOMY;  Surgeon: Mordecai Rasmussen, MD;  Location: [facility];  Service: Orthopedics;  Laterality: Right;   THERAPEUTIC ABORTION     x2   Patient Active Problem List   Diagnosis Date Noted   Hidradenitis suppurativa 08/18/2021   Spinal stenosis of lumbar region 12/12/2020   Papanicolaou smear of cervix with positive high risk human papilloma virus (HPV) test 08/23/2020   Hemorrhoids 08/16/2020   Encounter for screening fecal occult blood testing 08/16/2020   Encounter for gynecological examination with Papanicolaou smear of cervix 08/16/2020   Allergic reaction to adhesive 08/16/2020   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   Counseling regarding  advance care planning and goals of care 10/23/2018   Diffuse large B cell lymphoma ([place]) 08/11/2018   Brain tumor ([place]) 07/28/2018   ANA positive 03/24/2018   Carpal tunnel syndrome on both sides 03/24/2018   Chronic pain of both shoulders 03/24/2018   Hidradenitis 07/31/2016   Recurrent boils 07/31/2016   Hyperlipidemia 06/15/2015   Pain of left hand 05/19/2015   Hand pain, right 05/19/2015   Hematuria 03/03/2015   Esophageal reflux 03/03/2015   Essential hypertension, benign 03/03/2015   Bronchitis due to tobacco use 10/17/2010    ONSET DATE: 08/24/21  REFERRING DIAG: s/p right RCR  THERAPY DIAG:  Stiffness of right shoulder, not elsewhere classified  Acute pain of right shoulder  Other symptoms and signs involving the musculoskeletal system  Rationale for Evaluation and Treatment Rehabilitation  PERTINENT HISTORY: Pt is a 61 y/o female s/p right RCR on 08/24/21, presenting with RUE functional deficits limiting ability to perform ADLs using RUE as dominant. Presents in sling, pt was referred to occupational therapy for evaluation and treatment by Dr. Larena Glassman.   PRECAUTIONS: see protocol   WEIGHT BEARING RESTRICTIONS Yes NWB  SUBJECTIVE: S: "Let me tell you about this pain. It feels like someone is just digging in there."    PAIN:  Are you having pain? Yes: NPRS scale: 8/10 Pain location: anterior shoulder Pain description: deep, in the bone Aggravating factors: movement  Relieving factors: rest, ice  Pain is worse at night     PATIENT GOALS To be able to use her right arm during daily tasks.  OBJECTIVE:   HAND DOMINANCE: Right  FUNCTIONAL OUTCOME MEASURES: FOTO: 4/100 8/3/: FOTO: 52.92   UPPER EXTREMITY ROM        Assessed seated, er/IR adducted Passive ROM Right eval Right 10/12/21 Right 11/02/21  Shoulder flexion 62 155 161  Shoulder abduction 45 120 126  Shoulder internal rotation 90 90 90  Shoulder external rotation 51 45 75  (Blank rows =  not tested)  Active ROM Right eval Right 10/12/21 Right 11/02/21  Shoulder flexion  120 146  Shoulder abduction   73 118  Shoulder internal rotation  90 90  Shoulder external rotation  45 73       UPPER EXTREMITY MMT:      Unable to assess due to precautions MMT Right eval Right  10/12/21 Right 11/02/21  Shoulder flexion   3/5 4/5  Shoulder abduction   3/5 3+/5  Shoulder internal rotation   3/5 4+/5  Shoulder external rotation   3/5 4/5  (Blank rows = not tested)          GOALS: Goals reviewed with patient? Yes   SHORT TERM GOALS: Target date: 10/05/2021     Pt will be provided with HEP to improve mobility of RUE required for ADL completion.    Goal status: MET   2.  Pt will increase  RUE P/ROM to [facility] to improve ability to perform dressing tasks with minimal compensatory strategies and techniques.    Goal status: MET   3.  Pt will increase RUE strength to 3/5 or greater to improve ability to reach for items at waist to chest height during functional tasks.    Goal status: MET       LONG TERM GOALS: Target date: 11/02/2021     Pt will return to highest level of functioning using RUE as dominant during ADL and work tasks.    Goal status: Ongoing   2.  Pt will decrease pain in RUE to 3/10 or less to improve ability to use RUE as dominant during housework tasks.    Goal status: Ongoing   3.  Pt will decrease RUE fascial restrictions to min amounts or less to improve mobility required for functional reaching tasks at home and work.    Goal status: Ongoing   4.  Pt will increase RUE A/ROM to [facility] to improve ability to reaching overhead and behind back during dressing and bathing tasks.    Goal status: Ongoing   5.  Pt will increase RUE strength to 4+/5 or greater to improve ability to perform lifting tasks at work.    Goal status: Ongoing    TODAY'S TREATMENT:  11/15/21  -Manual therapy: soft tissue mobilization and myofascial release to right serratus  anterior, subscapularis, and axillary regions to address pain and fascial restrictions and increase joint ROM -P/ROM: 1x5 each movement flexion, abduction, IR/er -A/ROM: 1x10 seated-protraction, flexion, horizontal abduction, er/IR, abduction -Stretch: forward flexion wall stretch, 5x15" -Scapular strengthening: 1x10 row, extension, blue theraband -Overhead lacing: seated, lacing from top down then removing    11/06/21  -Manual therapy: soft tissue mobilization and myofascial release to right serratus anterior, subscapularis, and trapezius regions to address pain and fascial restrictions and increase joint ROM -P/ROM: 1x5 each movement flexion, abduction, IR/er -A/ROM: supine-protraction, flexion, horizontal abduction, er/IR, abduction, 10 reps -Proximal shoulder strengthening: supine, paddles, criss cross, circles each direction, 10 reps each -A/ROM: standing-protraction, flexion, horizontal abduction, er/IR, abduction, 10 reps -Proximal shoulder strengthening: standing-paddles, criss cross, circles each direction, 10 reps each -Overhead lacing: seated, lacing from top down then removing -Ball pass: 10 reps behind back for IR using tennis ball   11/02/21 -P/ROM: 1x5 each movement flexion, abduction, IR/er -A/ROM: 1x5 each movement flexion, abduction, IR/er  -Manual therapy: soft tissue mobilization and myofascial release to right serratus anterior, subscapularis, and trapezius regions to address pain and fascial restrictions and increase joint ROM.  -Wall wash, 1x5 forward flexion, 1x5 abduction  -Functional Reach: Removing items from middle shelf, top shelf items to middle shelf, counter items to top shelf -UBE: Level 1, 2 minutes, 2.5 speed -Scapular theraband:red, row, extension, 10 reps each   PATIENT EDUCATION: Education details: A/ROM Person educated: Patient Education method: Consulting civil engineer, Media planner, handout  Education comprehension: verbalized understanding, returned  demonstration      HOME EXERCISE PROGRAM: Eval: table slides 6/20: Elbow AROM 7/13: Shoulder AA/ROM 8/1: Theraband Scapular Strengthening  8/7: A/ROM    ASSESSMENT:   CLINICAL IMPRESSION: A: Pt reports persistent pain at the shoulder that has not improved. Pt reporting most pain in posterior shoulder/axillary region with A/ROM abduction with some fascial restrictions noted and addressed with manual therapy. Pt requesting to discontinue horizontal abduction due to increased pain, transitioned to prolonged stretch to decrease muscle guarding and pain. Therapist cuing for minimizing compensations with scapular strengthening and proximal shoulder strengthening  PLAN:   OT FREQUENCY: 2x/week  OT DURATION: 8 weeks  PLANNED INTERVENTIONS: self care/ADL training, therapeutic exercise, therapeutic activity, manual therapy, passive range of motion, splinting, electrical stimulation, ultrasound, moist heat, patient/family education, and DME and/or AE instructions  RECOMMENDED OTHER SERVICES: Pt will benefit from skilled OT services to decrease pain and fascial restrictions, increase joint ROM, strength, and functional use of RUE as dominant.  CONSULTED AND AGREED WITH PLAN OF CARE: Patient  PLAN FOR NEXT SESSION: P: Assess and consider discharge         Mathews Robinsons, OTR/L [phone]  11/15/2021, 8:12 AM

## 2021-11-16 ENCOUNTER — Telehealth: Payer: Self-pay | Admitting: Orthopedic Surgery

## 2021-11-16 NOTE — Telephone Encounter (Signed)
Patient came to office inquiring about short-term disability form; states needed before or by Tuesday, 11/21/21 - states they have not received form back yet.

## 2021-11-16 NOTE — Telephone Encounter (Signed)
Done

## 2021-11-17 DIAGNOSIS — M25511 Pain in right shoulder: Secondary | ICD-10-CM | POA: Diagnosis not present

## 2021-11-17 DIAGNOSIS — F1120 Opioid dependence, uncomplicated: Secondary | ICD-10-CM | POA: Diagnosis not present

## 2021-11-17 DIAGNOSIS — M48061 Spinal stenosis, lumbar region without neurogenic claudication: Secondary | ICD-10-CM | POA: Diagnosis not present

## 2021-11-17 DIAGNOSIS — M5416 Radiculopathy, lumbar region: Secondary | ICD-10-CM | POA: Diagnosis not present

## 2021-11-17 NOTE — Telephone Encounter (Signed)
I faxed it thru releases on 11/08/21, says received.  I sent it again via fax today.  Called patient LM advising and asked her to call me back if they still don't have.

## 2021-11-20 ENCOUNTER — Encounter (HOSPITAL_COMMUNITY): Payer: BC Managed Care – PPO

## 2021-11-21 ENCOUNTER — Encounter: Payer: BC Managed Care – PPO | Admitting: Orthopedic Surgery

## 2021-11-24 ENCOUNTER — Encounter: Payer: Self-pay | Admitting: Orthopedic Surgery

## 2021-11-24 ENCOUNTER — Ambulatory Visit (INDEPENDENT_AMBULATORY_CARE_PROVIDER_SITE_OTHER): Payer: BC Managed Care – PPO | Admitting: Orthopedic Surgery

## 2021-11-24 VITALS — Ht 65.0 in | Wt 170.0 lb

## 2021-11-24 DIAGNOSIS — M75111 Incomplete rotator cuff tear or rupture of right shoulder, not specified as traumatic: Secondary | ICD-10-CM

## 2021-11-24 NOTE — Progress Notes (Signed)
Orthopaedic Postop Note  Assessment: Amber Drake is a 61 y.o. female s/p right shoulder arthroscopy, with rotator cuff repair and biceps tenotomy  DOS: 08/24/21  Plan: [name] continues to progress following surgery.  Physical therapy has helped her with her pain, as well as range of motion.  She has near full range of motion of the right shoulder, with residual weakness.  No issues with her incisions.  She is taking less of the narcotic pain medication.  She does need a new referral, to continue working with PT.  Once again, I reiterated that this is a full 60-monthrecovery.  She states her understanding.  She will start progressing with strengthening at this time.  Follow-up in 3 months.  Follow-up: Return in about 3 months (around 02/24/2022). XR at next visit: None  Subjective:  Chief Complaint  Patient presents with   Routine Post Op    Rt shoulder DOS 08/24/21    History of Present Illness: Amber Balbiis a 61y.o. female who presents following the above stated procedure.  Surgery was approximately 3 months ago.  She is doing better.  She does continue to have some pain, especially in the anterior aspect of the shoulder.  Pain also gets worse at nighttime.  She states she is taking less of the narcotic.  She has continued to work with physical therapy, and notes improvements in her range of motion.  Has been focusing on range of motion, and has yet to initiate strengthening.  Review of Systems: No fevers or chills No numbness or tingling No Chest Pain No shortness of breath   Objective: Ht '5\' 5"'$  (1.651 m)   Wt 170 lb (77.1 kg)   LMP 07/18/2010   BMI 28.29 kg/m   Physical Exam:  Alert and oriented.  No acute distress.  Surgical incisions have healed.  No surrounding erythema or drainage.  Active forward flexion to 160 degrees.  Active internal rotation to the lumbar spine.  Abduction to 100 degrees.  Fingers are warm and well-perfused.  No numbness or tingling  in the right hand.  2+ radial pulse.  IMAGING: I personally ordered and reviewed the following images:  No new Imaging obtained today.   MMordecai Rasmussen MD 11/24/2021 1:00 PM

## 2021-11-28 ENCOUNTER — Encounter: Payer: Self-pay | Admitting: Orthopedic Surgery

## 2021-12-01 ENCOUNTER — Encounter (HOSPITAL_COMMUNITY): Payer: BC Managed Care – PPO

## 2021-12-05 ENCOUNTER — Ambulatory Visit (HOSPITAL_COMMUNITY): Payer: BC Managed Care – PPO | Attending: Orthopedic Surgery | Admitting: Occupational Therapy

## 2021-12-05 ENCOUNTER — Encounter (HOSPITAL_COMMUNITY): Payer: Self-pay

## 2021-12-05 DIAGNOSIS — R29898 Other symptoms and signs involving the musculoskeletal system: Secondary | ICD-10-CM

## 2021-12-05 DIAGNOSIS — M25611 Stiffness of right shoulder, not elsewhere classified: Secondary | ICD-10-CM

## 2021-12-05 DIAGNOSIS — M25511 Pain in right shoulder: Secondary | ICD-10-CM | POA: Diagnosis not present

## 2021-12-05 NOTE — Patient Instructions (Signed)
Theraband strengthening: Complete 10x each, 1x/day  1) Shoulder protraction  Anchor band in doorway, stand with back to door. Push your hand forward as much as you can to bringing your shoulder blades forward on your rib cage.      2) Shoulder horizontal abduction  Standing with a theraband anchored at chest height, begin with arm straight and some tension in the band. Move your arm out to your side (keeping straight the whole time). Bring the affected arm back to midline.     3) Shoulder Internal Rotation  While holding an elastic band at your side with your elbow bent, start with your hand away from your stomach, then pull the band towards your stomach. Keep your elbow near your side the entire time.     4) Shoulder External Rotation  While holding an elastic band at your side with your elbow bent, start with your hand near your stomach and then pull the band away. Keep your elbow at your side the entire time.     5) Shoulder flexion  While standing with back to the door, holding Theraband at hand level, raise arm in front of you.  Keep elbow straight through entire movement.      6) Shoulder abduction  While holding an elastic band at your side, draw up your arm to the side keeping your elbow straight.

## 2021-12-05 NOTE — Therapy (Signed)
OUTPATIENT OCCUPATIONAL THERAPY REASSESSMENT & TREATMENT NOTE   Patient Name: Amber Drake MRN: [id number] DOB:07-30-1960, 61 y.o., female Today's Date: 12/05/2021  PCP: Dr. [name] REFERRING PROVIDER: Dr. Larena Glassman    END OF SESSION:   OT End of Session - 12/05/21 0911     Visit Number 14    Number of Visits 18    Date for OT Re-Evaluation 01/04/22    Authorization Type 1) BCBS Commercial 2) [facility] Medicaid    Authorization Time Period UHC Medicaid: No auth required at this time, 27 visits per year combined PT/OT/SP    Authorization - Visit Number 13    Authorization - Number of Visits 27    OT Start Time 0740    OT Stop Time 0801   pt needed to leave early   OT Time Calculation (min) 21 min    Activity Tolerance Patient tolerated treatment well    Behavior During Therapy WFL for tasks assessed/performed                     Past Medical History:  Diagnosis Date   Anemia    Arthritis    Blood dyscrasia    sickle cell trait   Cancer ([place])    lymphoma   Carbuncle and furuncle    Hypercholesterolemia    Hypertension    does not take meds   Papanicolaou smear of cervix with positive high risk human papilloma virus (HPV) test 08/23/2020   08/23/20    [place] per ASCCP guidelines, immediate risk of CIN 3+ is 4.1 %   Sickle cell trait ([place])    Past Surgical History:  Procedure Laterality Date   APPENDECTOMY     APPLICATION OF CRANIAL NAVIGATION N/A 07/28/2018   Procedure: APPLICATION OF CRANIAL NAVIGATION;  Surgeon: Kary Kos, MD;  Location: [place];  Service: Neurosurgery;  Laterality: N/A;   ARTHOSCOPIC ROTAOR CUFF REPAIR Right 08/24/2021   Procedure: ARTHROSCOPIC ROTATOR CUFF REPAIR;  Surgeon: Mordecai Rasmussen, MD;  [facility];  Service: Orthopedics;  Laterality: Right;   BRAIN SURGERY  2020   CARPAL TUNNEL RELEASE  03/23/2011   Procedure: CARPAL TUNNEL RELEASE;  Surgeon: Sanjuana Kava;  [facility];  Service: Orthopedics;  Laterality: Left;    CARPAL TUNNEL RELEASE  05/10/2011   Procedure: CARPAL TUNNEL RELEASE;  Surgeon: Sanjuana Kava, MD;  [facility];  Service: Orthopedics;  Laterality: Right;   CESAREAN SECTION     x 2   COLONOSCOPY WITH PROPOFOL N/A 11/16/2020   Procedure: COLONOSCOPY WITH PROPOFOL;  Surgeon: Harvel Quale, MD;  [facility];  Service: Gastroenterology;  Laterality: N/A;  9:15   INCISION AND DRAINAGE PERIRECTAL ABSCESS  12/21/2009   LUMBAR LAMINECTOMY/DECOMPRESSION MICRODISCECTOMY Left 12/12/2020   Procedure: Laminectomy and Foraminotomy - left - L5-S1;  Surgeon: Kary Kos, MD;  Location: [place];  Service: Neurosurgery;  Laterality: Left;  3C   PR DURAL GRAFT REPAIR,SPINE DEFECT N/A 07/28/2018   Procedure: Stereotactic open biopsy of Right cerebellar hemisphere and dura with brainlab;  Surgeon: Kary Kos, MD;  Location: [place];  Service: Neurosurgery;  Laterality: N/A;  Stereotactic open biopsy of Right cerebellar hemisphere and dura with brainlab   PROCTOSCOPY  10/17/2010   Procedure: PROCTOSCOPY;  Surgeon: Scherry Ran;  [facility]: [facility];  Service: General;  Laterality: N/A;  Rigid Proctoscopy/Possible Fistula in Ano  Procedure ended at [place] Right 08/24/2021   Procedure: SHOULDER ARTHROSCOPY WITH BICEPSTENOTOMY;  Surgeon: Mordecai Rasmussen, MD;  [facility];  Service: Orthopedics;  Laterality: Right;   THERAPEUTIC ABORTION     x2   Patient Active Problem List   Diagnosis Date Noted   Hidradenitis suppurativa 08/18/2021   Spinal stenosis of lumbar region 12/12/2020   Papanicolaou smear of cervix with positive high risk human papilloma virus (HPV) test 08/23/2020   Hemorrhoids 08/16/2020   Encounter for screening fecal occult blood testing 08/16/2020   Encounter for gynecological examination with Papanicolaou smear of cervix 08/16/2020   Allergic reaction to adhesive 08/16/2020   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[place])  10/23/2018   Counseling regarding advance care planning and goals of care 10/23/2018   Diffuse large B cell lymphoma ([place]) 08/11/2018   Brain tumor ([place]) 07/28/2018   ANA positive 03/24/2018   Carpal tunnel syndrome on both sides 03/24/2018   Chronic pain of both shoulders 03/24/2018   Hidradenitis 07/31/2016   Recurrent boils 07/31/2016   Hyperlipidemia 06/15/2015   Pain of left hand 05/19/2015   Hand pain, right 05/19/2015   Hematuria 03/03/2015   Esophageal reflux 03/03/2015   Essential hypertension, benign 03/03/2015   Bronchitis due to tobacco use 10/17/2010    ONSET DATE: 08/24/21  REFERRING DIAG: s/p right RCR  THERAPY DIAG:  Stiffness of right shoulder, not elsewhere classified  Acute pain of right shoulder  Other symptoms and signs involving the musculoskeletal system  Rationale for Evaluation and Treatment Rehabilitation  PERTINENT HISTORY: Pt is a 61 y/o female s/p right RCR on 08/24/21, presenting with RUE functional deficits limiting ability to perform ADLs using RUE as dominant. Presents in sling, pt was referred to occupational therapy for evaluation and treatment by Dr. Larena Glassman.   PRECAUTIONS: see protocol   WEIGHT BEARING RESTRICTIONS Yes NWB  SUBJECTIVE: S: "It hurts to move it back"    PAIN:  Are you having pain? Yes: NPRS scale: 7/10 Pain location: anterior shoulder Pain description: deep, in the bone Aggravating factors: movement  Relieving factors: rest, ice  Pain is worse at night     PATIENT GOALS To be able to use her right arm during daily tasks.  OBJECTIVE:   HAND DOMINANCE: Right  FUNCTIONAL OUTCOME MEASURES: FOTO: 4/100 8/3/: FOTO: 52.92 12/05/21: FOTO: 55/100   UPPER EXTREMITY ROM        Assessed seated, er/IR adducted Passive ROM Right eval Right 10/12/21 Right 11/02/21  Shoulder flexion 62 155 161  Shoulder abduction 45 120 126  Shoulder internal rotation 90 90 90  Shoulder external rotation 51 45 75  (Blank rows =  not tested)  Active ROM Right eval Right 10/12/21 Right 11/02/21 Right 12/05/21  Shoulder flexion  120 146 160  Shoulder abduction   73 118 140  Shoulder internal rotation  90 90 90  Shoulder external rotation  45 73 56       UPPER EXTREMITY MMT:      Unable to assess due to precautions MMT Right eval Right  10/12/21 Right 11/02/21 Right 12/05/21  Shoulder flexion   3/5 4/5 4+/5  Shoulder abduction   3/5 3+/5 4+/5  Shoulder internal rotation   3/5 4+/5 4+/5  Shoulder external rotation   3/5 4/5 4/5  (Blank rows = not tested)          GOALS: Goals reviewed with patient? Yes   SHORT TERM GOALS: Target date: 10/05/2021     Pt will be provided with HEP to improve mobility of RUE required for ADL completion.  Goal status: MET   2.  Pt will increase RUE P/ROM to [facility] to improve ability to perform dressing tasks with minimal compensatory strategies and techniques.    Goal status: MET   3.  Pt will increase RUE strength to 3/5 or greater to improve ability to reach for items at waist to chest height during functional tasks.    Goal status: MET       LONG TERM GOALS: Target date: 11/02/2021     Pt will return to highest level of functioning using RUE as dominant during ADL and work tasks.    Goal status: Ongoing   2.  Pt will decrease pain in RUE to 3/10 or less to improve ability to use RUE as dominant during housework tasks.    Goal status: Ongoing   3.  Pt will decrease RUE fascial restrictions to min amounts or less to improve mobility required for functional reaching tasks at home and work.    Goal status: MET   4.  Pt will increase RUE A/ROM to [facility] to improve ability to reaching overhead and behind back during dressing and bathing tasks.    Goal status: PARTIALLY MET   5.  Pt will increase RUE strength to 4+/5 or greater to improve ability to perform lifting tasks at work.    Goal status: PARTIALLY MET    TODAY'S TREATMENT:  12/05/21 -Red theraband  strengthening: protraction, horizontal abduction, er/IR, flexion, abduction, 10 reps each   11/15/21  -Manual therapy: soft tissue mobilization and myofascial release to right serratus anterior, subscapularis, and axillary regions to address pain and fascial restrictions and increase joint ROM -P/ROM: 1x5 each movement flexion, abduction, IR/er -A/ROM: 1x10 seated-protraction, flexion, horizontal abduction, er/IR, abduction -Stretch: forward flexion wall stretch, 5x15" -Scapular strengthening: 1x10 row, extension, blue theraband -Overhead lacing: seated, lacing from top down then removing    11/06/21  -Manual therapy: soft tissue mobilization and myofascial release to right serratus anterior, subscapularis, and trapezius regions to address pain and fascial restrictions and increase joint ROM -P/ROM: 1x5 each movement flexion, abduction, IR/er -A/ROM: supine-protraction, flexion, horizontal abduction, er/IR, abduction, 10 reps -Proximal shoulder strengthening: supine, paddles, criss cross, circles each direction, 10 reps each -A/ROM: standing-protraction, flexion, horizontal abduction, er/IR, abduction, 10 reps -Proximal shoulder strengthening: standing-paddles, criss cross, circles each direction, 10 reps each -Overhead lacing: seated, lacing from top down then removing -Ball pass: 10 reps behind back for IR using tennis ball    PATIENT EDUCATION: Education details: theraband strengthening Person educated: Patient Education method: Consulting civil engineer, Demonstration, handout  Education comprehension: verbalized understanding, returned demonstration      HOME EXERCISE PROGRAM: Eval: table slides 6/20: Elbow AROM 7/13: Shoulder AA/ROM 8/1: Theraband Scapular Strengthening  8/7: A/ROM  9/5: red theraband strengthening   ASSESSMENT:   CLINICAL IMPRESSION: A: Reassessment completed this session. Pt has met all STGs and 1 LTG, 2 additional LTGs partially met. Pt demonstrates improved ROM and  strength today, has minimal fascial restrictions. Pt continues to complain of pain in her shoulder, discussed timeline for progress and pain, as well as pt's history of chronic pain impacting pain level post surgery. Discussed HEP and importance of completing daily, as well as continuing to incorporate RUE into ADLs with exception of heavy lifting. Discussed HEP, upgraded to strengthening with instructions to progress as tolerated and only due A/ROM when necessary. Pt would like to continue with therapy for 1 month, reducing to 1x/week, to focusing on end ROM and improved strength and activity tolerance.  PLAN:   OT FREQUENCY: 1x/week  OT DURATION: 4 weeks  PLANNED INTERVENTIONS: self care/ADL training, therapeutic exercise, therapeutic activity, manual therapy, passive range of motion, splinting, electrical stimulation, ultrasound, moist heat, patient/family education, and DME and/or AE instructions  RECOMMENDED OTHER SERVICES: Pt will benefit from skilled OT services to decrease pain and fascial restrictions, increase joint ROM, strength, and functional use of RUE as dominant.  CONSULTED AND AGREED WITH PLAN OF CARE: Patient  PLAN FOR NEXT SESSION: P: continue with skilled OT services focusing on self-stretching for end ROM, RUE strengthening and activity tolerance work     [employer], OTR/L  [phone]  12/05/2021, 9:28 AM

## 2021-12-14 ENCOUNTER — Ambulatory Visit (HOSPITAL_COMMUNITY): Payer: BC Managed Care – PPO | Admitting: Occupational Therapy

## 2021-12-14 ENCOUNTER — Encounter (HOSPITAL_COMMUNITY): Payer: Self-pay | Admitting: Occupational Therapy

## 2021-12-14 DIAGNOSIS — M25511 Pain in right shoulder: Secondary | ICD-10-CM | POA: Diagnosis not present

## 2021-12-14 DIAGNOSIS — M25611 Stiffness of right shoulder, not elsewhere classified: Secondary | ICD-10-CM

## 2021-12-14 DIAGNOSIS — R29898 Other symptoms and signs involving the musculoskeletal system: Secondary | ICD-10-CM | POA: Diagnosis not present

## 2021-12-14 NOTE — Therapy (Signed)
OUTPATIENT OCCUPATIONAL THERAPY TREATMENT NOTE   Patient Name: Amber Drake MRN: [id number] DOB:1961/02/20, 61 y.o., female Today's Date: 12/14/2021  PCP: Dr. [name] REFERRING PROVIDER: Dr. Larena Glassman    END OF SESSION:   OT End of Session - 12/14/21 1525     Visit Number 15    Number of Visits 18    Date for OT Re-Evaluation 01/04/22    Authorization Type 1) BCBS Commercial 2) [facility] Medicaid    Authorization Time Period UHC Medicaid: No auth required at this time, 27 visits per year combined PT/OT/SP    Authorization - Visit Number 14    Authorization - Number of Visits 27    OT Start Time 1304    OT Stop Time 1345    OT Time Calculation (min) 41 min    Activity Tolerance Patient tolerated treatment well    Behavior During Therapy WFL for tasks assessed/performed                      Past Medical History:  Diagnosis Date   Anemia    Arthritis    Blood dyscrasia    sickle cell trait   Cancer ([place])    lymphoma   Carbuncle and furuncle    Hypercholesterolemia    Hypertension    does not take meds   Papanicolaou smear of cervix with positive high risk human papilloma virus (HPV) test 08/23/2020   08/23/20    [place] per ASCCP guidelines, immediate risk of CIN 3+ is 4.1 %   Sickle cell trait ([place])    Past Surgical History:  Procedure Laterality Date   APPENDECTOMY     APPLICATION OF CRANIAL NAVIGATION N/A 07/28/2018   Procedure: APPLICATION OF CRANIAL NAVIGATION;  Surgeon: Kary Kos, MD;  Location: [place];  Service: Neurosurgery;  Laterality: N/A;   ARTHOSCOPIC ROTAOR CUFF REPAIR Right 08/24/2021   Procedure: ARTHROSCOPIC ROTATOR CUFF REPAIR;  Surgeon: Mordecai Rasmussen, MD;  [facility];  Service: Orthopedics;  Laterality: Right;   BRAIN SURGERY  2020   CARPAL TUNNEL RELEASE  03/23/2011   Procedure: CARPAL TUNNEL RELEASE;  Surgeon: Sanjuana Kava;  [facility];  Service: Orthopedics;  Laterality: Left;   CARPAL TUNNEL RELEASE  05/10/2011    Procedure: CARPAL TUNNEL RELEASE;  Surgeon: Sanjuana Kava, MD;  [facility];  Service: Orthopedics;  Laterality: Right;   CESAREAN SECTION     x 2   COLONOSCOPY WITH PROPOFOL N/A 11/16/2020   Procedure: COLONOSCOPY WITH PROPOFOL;  Surgeon: Harvel Quale, MD;  [facility];  Service: Gastroenterology;  Laterality: N/A;  9:15   INCISION AND DRAINAGE PERIRECTAL ABSCESS  12/21/2009   LUMBAR LAMINECTOMY/DECOMPRESSION MICRODISCECTOMY Left 12/12/2020   Procedure: Laminectomy and Foraminotomy - left - L5-S1;  Surgeon: Kary Kos, MD;  Location: [place];  Service: Neurosurgery;  Laterality: Left;  3C   PR DURAL GRAFT REPAIR,SPINE DEFECT N/A 07/28/2018   Procedure: Stereotactic open biopsy of Right cerebellar hemisphere and dura with brainlab;  Surgeon: Kary Kos, MD;  Location: [place];  Service: Neurosurgery;  Laterality: N/A;  Stereotactic open biopsy of Right cerebellar hemisphere and dura with brainlab   PROCTOSCOPY  10/17/2010   Procedure: PROCTOSCOPY;  Surgeon: Scherry Ran;  [facility];  Service: General;  Laterality: N/A;  Rigid Proctoscopy/Possible Fistula in Ano  Procedure ended at [place] Right 08/24/2021   Procedure: SHOULDER ARTHROSCOPY WITH BICEPSTENOTOMY;  Surgeon: Mordecai Rasmussen, MD;  [facility]: [facility];  Service: Orthopedics;  Laterality: Right;   THERAPEUTIC ABORTION     x2   Patient Active Problem List   Diagnosis Date Noted   Hidradenitis suppurativa 08/18/2021   Spinal stenosis of lumbar region 12/12/2020   Papanicolaou smear of cervix with positive high risk human papilloma virus (HPV) test 08/23/2020   Hemorrhoids 08/16/2020   Encounter for screening fecal occult blood testing 08/16/2020   Encounter for gynecological examination with Papanicolaou smear of cervix 08/16/2020   Allergic reaction to adhesive 08/16/2020   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[place]) 10/23/2018   Counseling regarding  advance care planning and goals of care 10/23/2018   Diffuse large B cell lymphoma ([place]) 08/11/2018   Brain tumor ([place]) 07/28/2018   ANA positive 03/24/2018   Carpal tunnel syndrome on both sides 03/24/2018   Chronic pain of both shoulders 03/24/2018   Hidradenitis 07/31/2016   Recurrent boils 07/31/2016   Hyperlipidemia 06/15/2015   Pain of left hand 05/19/2015   Hand pain, right 05/19/2015   Hematuria 03/03/2015   Esophageal reflux 03/03/2015   Essential hypertension, benign 03/03/2015   Bronchitis due to tobacco use 10/17/2010    ONSET DATE: 08/24/21  REFERRING DIAG: s/p right RCR  THERAPY DIAG:  Stiffness of right shoulder, not elsewhere classified  Acute pain of right shoulder  Other symptoms and signs involving the musculoskeletal system  Rationale for Evaluation and Treatment Rehabilitation  PERTINENT HISTORY: Pt is a 61 y/o female s/p right RCR on 08/24/21, presenting with RUE functional deficits limiting ability to perform ADLs using RUE as dominant. Presents in sling, pt was referred to occupational therapy for evaluation and treatment by Dr. Larena Glassman.   PRECAUTIONS: see protocol   WEIGHT BEARING RESTRICTIONS Yes NWB  SUBJECTIVE: S: "I'm good with reaching."    PAIN:  Are you having pain? Yes: NPRS scale: 5/10 Pain location: anterior shoulder Pain description: deep, in the bone Aggravating factors: movement  Relieving factors: rest, ice  Pain is worse at night     PATIENT GOALS To be able to use her right arm during daily tasks.  OBJECTIVE:   HAND DOMINANCE: Right  FUNCTIONAL OUTCOME MEASURES: FOTO: 4/100 8/3/: FOTO: 52.92 12/05/21: FOTO: 55/100   UPPER EXTREMITY ROM        Assessed seated, er/IR adducted Passive ROM Right eval Right 10/12/21 Right 11/02/21  Shoulder flexion 62 155 161  Shoulder abduction 45 120 126  Shoulder internal rotation 90 90 90  Shoulder external rotation 51 45 75  (Blank rows = not tested)  Active ROM  Right eval Right 10/12/21 Right 11/02/21 Right 12/05/21  Shoulder flexion  120 146 160  Shoulder abduction   73 118 140  Shoulder internal rotation  90 90 90  Shoulder external rotation  45 73 56       UPPER EXTREMITY MMT:      Unable to assess due to precautions MMT Right eval Right  10/12/21 Right 11/02/21 Right 12/05/21  Shoulder flexion   3/5 4/5 4+/5  Shoulder abduction   3/5 3+/5 4+/5  Shoulder internal rotation   3/5 4+/5 4+/5  Shoulder external rotation   3/5 4/5 4/5  (Blank rows = not tested)          GOALS: Goals reviewed with patient? Yes   SHORT TERM GOALS: Target date: 10/05/2021     Pt will be provided with HEP to improve mobility of RUE required for ADL completion.    Goal status: MET  2.  Pt will increase RUE P/ROM to [facility] to improve ability to perform dressing tasks with minimal compensatory strategies and techniques.    Goal status: MET   3.  Pt will increase RUE strength to 3/5 or greater to improve ability to reach for items at waist to chest height during functional tasks.    Goal status: MET       LONG TERM GOALS: Target date: 11/02/2021     Pt will return to highest level of functioning using RUE as dominant during ADL and work tasks.    Goal status: Ongoing   2.  Pt will decrease pain in RUE to 3/10 or less to improve ability to use RUE as dominant during housework tasks.    Goal status: Ongoing   3.  Pt will decrease RUE fascial restrictions to min amounts or less to improve mobility required for functional reaching tasks at home and work.    Goal status: MET   4.  Pt will increase RUE A/ROM to [facility] to improve ability to reaching overhead and behind back during dressing and bathing tasks.    Goal status: PARTIALLY MET   5.  Pt will increase RUE strength to 4+/5 or greater to improve ability to perform lifting tasks at work.    Goal status: PARTIALLY MET    TODAY'S TREATMENT:  12/14/21 -Shoulder strengthening: standing, 1#  weight-protraction, flexion, horizontal abduction, er/IR, abduction, 10 reps -proximal shoulder strengthening: paddles, criss cross, circles each direction, 10 reps each -external rotation stretch at wall: 3x10" -x to v arms: 1# weight, 10 reps -Green therapy ball strengthening: chest press, flexion, circles each direction, overhead press, 10 reps each -Ball on wall: 1' flexion, 1' abduction -Overhead lacing" 1# weight, lacing from top down then bottom up -Scapular theraband: green, row, extension, retraction, 10 reps each -UBE: level 1, 3' forward, 3' reverse, pace: 5.5   12/05/21 -Red theraband strengthening: protraction, horizontal abduction, er/IR, flexion, abduction, 10 reps each   11/15/21  -Manual therapy: soft tissue mobilization and myofascial release to right serratus anterior, subscapularis, and axillary regions to address pain and fascial restrictions and increase joint ROM -P/ROM: 1x5 each movement flexion, abduction, IR/er -A/ROM: 1x10 seated-protraction, flexion, horizontal abduction, er/IR, abduction -Stretch: forward flexion wall stretch, 5x15" -Scapular strengthening: 1x10 row, extension, blue theraband -Overhead lacing: seated, lacing from top down then removing    PATIENT EDUCATION: Education details: discussed HEP completion Person educated: Patient Education method: Consulting civil engineer, Demonstration, handout  Education comprehension: verbalized understanding, returned demonstration      HOME EXERCISE PROGRAM: Eval: table slides 6/20: Elbow AROM 7/13: Shoulder AA/ROM 8/1: Theraband Scapular Strengthening  8/7: A/ROM  9/5: red theraband strengthening   ASSESSMENT:   CLINICAL IMPRESSION: A: Pt reports she is doing well, only has difficulty with external rotation at end range. Progressed to shoulder strengthening today using 1# weights. Added x to v arms, green therapy ball strengthening, and completed ball on wall and overhead lacing with weights. Continued with  shoulder stretches at wall. Pt with mod fatigue, requiring verbal cuing for form and technique during exercises.      PLAN:   OT FREQUENCY: 1x/week  OT DURATION: 4 weeks  PLANNED INTERVENTIONS: self care/ADL training, therapeutic exercise, therapeutic activity, manual therapy, passive range of motion, splinting, electrical stimulation, ultrasound, moist heat, patient/family education, and DME and/or AE instructions  RECOMMENDED OTHER SERVICES: Pt will benefit from skilled OT services to decrease pain and fascial restrictions, increase joint ROM, strength, and functional use of RUE  as dominant.  CONSULTED AND AGREED WITH PLAN OF CARE: Patient  PLAN FOR NEXT SESSION: P: self-stretching for end ROM, RUE strengthening and activity tolerance work     [employer], OTR/L  [phone]  12/14/2021, 3:26 PM

## 2021-12-19 ENCOUNTER — Encounter (HOSPITAL_COMMUNITY): Payer: BC Managed Care – PPO | Admitting: Occupational Therapy

## 2021-12-20 DIAGNOSIS — M5117 Intervertebral disc disorders with radiculopathy, lumbosacral region: Secondary | ICD-10-CM | POA: Diagnosis not present

## 2021-12-20 DIAGNOSIS — M5116 Intervertebral disc disorders with radiculopathy, lumbar region: Secondary | ICD-10-CM | POA: Diagnosis not present

## 2021-12-20 DIAGNOSIS — M4726 Other spondylosis with radiculopathy, lumbar region: Secondary | ICD-10-CM | POA: Diagnosis not present

## 2021-12-20 DIAGNOSIS — M4727 Other spondylosis with radiculopathy, lumbosacral region: Secondary | ICD-10-CM | POA: Diagnosis not present

## 2021-12-21 ENCOUNTER — Encounter (HOSPITAL_COMMUNITY): Payer: BC Managed Care – PPO | Admitting: Occupational Therapy

## 2021-12-22 ENCOUNTER — Ambulatory Visit (HOSPITAL_COMMUNITY): Payer: BC Managed Care – PPO | Admitting: Occupational Therapy

## 2021-12-22 DIAGNOSIS — M25511 Pain in right shoulder: Secondary | ICD-10-CM | POA: Diagnosis not present

## 2021-12-22 DIAGNOSIS — R29898 Other symptoms and signs involving the musculoskeletal system: Secondary | ICD-10-CM | POA: Diagnosis not present

## 2021-12-22 DIAGNOSIS — M25611 Stiffness of right shoulder, not elsewhere classified: Secondary | ICD-10-CM

## 2021-12-22 NOTE — Therapy (Signed)
OUTPATIENT OCCUPATIONAL THERAPY TREATMENT NOTE   Patient Name: Amber Drake MRN: [id number] DOB:[birth year], 61 y.o., female Today's Date: 12/22/2021  PCP: Dr. [name] REFERRING PROVIDER: Dr. Larena Glassman    END OF SESSION:   OT End of Session - 12/22/21 0820     Visit Number 16    Number of Visits 18    Date for OT Re-Evaluation 01/04/22    Authorization Type 1) BCBS Commercial 2) [facility] Medicaid    Authorization Time Period UHC Medicaid: No auth required at this time, 27 visits per year combined PT/OT/SP    Authorization - Visit Number 15    Authorization - Number of Visits 27    OT Start Time 0732    OT Stop Time 0816    OT Time Calculation (min) 44 min    Activity Tolerance Patient tolerated treatment well    Behavior During Therapy WFL for tasks assessed/performed              Past Medical History:  Diagnosis Date   Anemia    Arthritis    Blood dyscrasia    sickle cell trait   Cancer ([place])    lymphoma   Carbuncle and furuncle    Hypercholesterolemia    Hypertension    does not take meds   Papanicolaou smear of cervix with positive high risk human papilloma virus (HPV) test 08/23/2020   08/23/20    [place] per ASCCP guidelines, immediate risk of CIN 3+ is 4.1 %   Sickle cell trait ([place])    Past Surgical History:  Procedure Laterality Date   APPENDECTOMY     APPLICATION OF CRANIAL NAVIGATION N/A 07/28/2018   Procedure: APPLICATION OF CRANIAL NAVIGATION;  Surgeon: Kary Kos, MD;  Location: [place];  Service: Neurosurgery;  Laterality: N/A;   ARTHOSCOPIC ROTAOR CUFF REPAIR Right 08/24/2021   Procedure: ARTHROSCOPIC ROTATOR CUFF REPAIR;  Surgeon: Mordecai Rasmussen, MD;  Location[facility];  Service: Orthopedics;  Laterality: Right;   BRAIN SURGERY  2020   CARPAL TUNNEL RELEASE  03/23/2011   Procedure: CARPAL TUNNEL RELEASE;  Surgeon: Sanjuana Kava;  Location: [facility];  Service: Orthopedics;  Laterality: Left;   CARPAL TUNNEL RELEASE  05/10/2011   Procedure:  CARPAL TUNNEL RELEASE;  Surgeon: Sanjuana Kava, MD;  Location[facility];  Service: Orthopedics;  Laterality: Right;   CESAREAN SECTION     x 2   COLONOSCOPY WITH PROPOFOL N/A 11/16/2020   Procedure: COLONOSCOPY WITH PROPOFOL;  Surgeon: Harvel Quale, MD;  [facility];  Service: Gastroenterology;  Laterality: N/A;  9:15   INCISION AND DRAINAGE PERIRECTAL ABSCESS  12/21/2009   LUMBAR LAMINECTOMY/DECOMPRESSION MICRODISCECTOMY Left 12/12/2020   Procedure: Laminectomy and Foraminotomy - left - L5-S1;  Surgeon: Kary Kos, MD;  Location: [place];  Service: Neurosurgery;  Laterality: Left;  3C   PR DURAL GRAFT REPAIR,SPINE DEFECT N/A 07/28/2018   Procedure: Stereotactic open biopsy of Right cerebellar hemisphere and dura with brainlab;  Surgeon: Kary Kos, MD;  Location: [place];  Service: Neurosurgery;  Laterality: N/A;  Stereotactic open biopsy of Right cerebellar hemisphere and dura with brainlab   PROCTOSCOPY  10/17/2010   Procedure: PROCTOSCOPY;  Surgeon: Scherry Ran;  Location: [facility];  Service: General;  Laterality: N/A;  Rigid Proctoscopy/Possible Fistula in Ano  Procedure ended at [place] Right 08/24/2021   Procedure: SHOULDER ARTHROSCOPY WITH BICEPSTENOTOMY;  Surgeon: Mordecai Rasmussen, MD;  Location: [facility];  Service: Orthopedics;  Laterality:  Right;   THERAPEUTIC ABORTION     x2   Patient Active Problem List   Diagnosis Date Noted   Hidradenitis suppurativa 08/18/2021   Spinal stenosis of lumbar region 12/12/2020   Papanicolaou smear of cervix with positive high risk human papilloma virus (HPV) test 08/23/2020   Hemorrhoids 08/16/2020   Encounter for screening fecal occult blood testing 08/16/2020   Encounter for gynecological examination with Papanicolaou smear of cervix 08/16/2020   Allergic reaction to adhesive 08/16/2020   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   Counseling regarding advance care  planning and goals of care 10/23/2018   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   Brain tumor ([place]) 07/28/2018   ANA positive 03/24/2018   Carpal tunnel syndrome on both sides 03/24/2018   Chronic pain of both shoulders 03/24/2018   Hidradenitis 07/31/2016   Recurrent boils 07/31/2016   Hyperlipidemia 06/15/2015   Pain of left hand 05/19/2015   Hand pain, right 05/19/2015   Hematuria 03/03/2015   Esophageal reflux 03/03/2015   Essential hypertension, benign 03/03/2015   Bronchitis due to tobacco use 10/17/2010    ONSET DATE: 08/24/21  REFERRING DIAG: s/p right RCR  THERAPY DIAG:  Stiffness of right shoulder, not elsewhere classified  Acute pain of right shoulder  Other symptoms and signs involving the musculoskeletal system  Rationale for Evaluation and Treatment Rehabilitation  PERTINENT HISTORY: Pt is a 61 y/o female s/p right RCR on 08/24/21, presenting with RUE functional deficits limiting ability to perform ADLs using RUE as dominant. Presents in sling, pt was referred to occupational therapy for evaluation and treatment by Dr. Larena Glassman.   PRECAUTIONS: see protocol   WEIGHT BEARING RESTRICTIONS Yes NWB  SUBJECTIVE: S: "My shoulder just hurts so bad all over, feels like I can't move it how I want to."    PAIN:  Are you having pain? Yes: NPRS scale: 8/10 Pain location: anterior shoulder, bicep tendon, and axillary region Pain description: achy and sharp Aggravating factors: movement  Relieving factors: rest, ice, pain meds  Pain is worse at night     PATIENT GOALS To be able to use her right arm during daily tasks.  OBJECTIVE:   HAND DOMINANCE: Right  FUNCTIONAL OUTCOME MEASURES: FOTO: 4/100 8/3/: FOTO: 52.92 12/05/21: FOTO: 55/100   UPPER EXTREMITY ROM        Assessed seated, er/IR adducted Passive ROM Right eval Right 10/12/21 Right 11/02/21  Shoulder flexion 62 155 161  Shoulder abduction 45 120 126  Shoulder internal rotation 90 90 90   Shoulder external rotation 51 45 75  (Blank rows = not tested)  Active ROM Right eval Right 10/12/21 Right 11/02/21 Right 12/05/21  Shoulder flexion  120 146 160  Shoulder abduction   73 118 140  Shoulder internal rotation  90 90 90  Shoulder external rotation  45 73 56       UPPER EXTREMITY MMT:      Unable to assess due to precautions MMT Right eval Right  10/12/21 Right 11/02/21 Right 12/05/21  Shoulder flexion   3/5 4/5 4+/5  Shoulder abduction   3/5 3+/5 4+/5  Shoulder internal rotation   3/5 4+/5 4+/5  Shoulder external rotation   3/5 4/5 4/5  (Blank rows = not tested)          GOALS: Goals reviewed with patient? Yes   SHORT TERM GOALS: Target date: 10/05/2021     Pt will be provided with HEP to improve mobility of RUE  required for ADL completion.    Goal status: MET   2.  Pt will increase RUE P/ROM to [facility] to improve ability to perform dressing tasks with minimal compensatory strategies and techniques.    Goal status: MET   3.  Pt will increase RUE strength to 3/5 or greater to improve ability to reach for items at waist to chest height during functional tasks.    Goal status: MET       LONG TERM GOALS: Target date: 11/02/2021     Pt will return to highest level of functioning using RUE as dominant during ADL and work tasks.    Goal status: Ongoing   2.  Pt will decrease pain in RUE to 3/10 or less to improve ability to use RUE as dominant during housework tasks.    Goal status: Ongoing   3.  Pt will decrease RUE fascial restrictions to min amounts or less to improve mobility required for functional reaching tasks at home and work.    Goal status: MET   4.  Pt will increase RUE A/ROM to [facility] to improve ability to reaching overhead and behind back during dressing and bathing tasks.    Goal status: PARTIALLY MET   5.  Pt will increase RUE strength to 4+/5 or greater to improve ability to perform lifting tasks at work.    Goal status: PARTIALLY MET     TODAY'S TREATMENT:  12/22/21  -Manual therapy: soft tissue mobilization and myofascial release to right serratus anterior, subscapularis, and axillary regions to address pain and fascial restrictions and increase joint ROM -Wall Washing: forward flexion, abduction, 1x10 with 15 sec hold at end range for stretch -Stretching: extension, pectoralis, latissimus, biceps, er/IR, 3x30" -scapular strengthening: green theraband, extension, retraction, protraction, rows, 1x15 -proximal shoulder strengthening: paddles, criss cross, circles each direction, 2x45" -UBE: Level 2, 3' forward, pace 9.0  12/14/21 -Shoulder strengthening: standing, 1# weight-protraction, flexion, horizontal abduction, er/IR, abduction, 10 reps -proximal shoulder strengthening: paddles, criss cross, circles each direction, 10 reps each -external rotation stretch at wall: 3x10" -x to v arms: 1# weight, 10 reps -Green therapy ball strengthening: chest press, flexion, circles each direction, overhead press, 10 reps each -Ball on wall: 1' flexion, 1' abduction -Overhead lacing" 1# weight, lacing from top down then bottom up -Scapular theraband: green, row, extension, retraction, 10 reps each -UBE: level 1, 3' forward, 3' reverse, pace: 5.5   12/05/21 -Red theraband strengthening: protraction, horizontal abduction, er/IR, flexion, abduction, 10 reps each   PATIENT EDUCATION: Education details: discussed HEP completion Person educated: Patient Education method: Explanation, Demonstration, handout  Education comprehension: verbalized understanding, returned demonstration      HOME EXERCISE PROGRAM: Eval: table slides 6/20: Elbow AROM 7/13: Shoulder AA/ROM 8/1: Theraband Scapular Strengthening  8/7: A/ROM  9/5: red theraband strengthening   ASSESSMENT:   CLINICAL IMPRESSION: A: Pt reporting increased pain and tenderness this session. She is having difficulty with most end range motions. This session focused on manual  therapy and stretching to relieve severe fascial restrictions and muscle tension in the biceps, pectoralis, and axillary region. Therapist providing assist for positioning and proper mechanics, as well as mod verbal cues to reduce compensatory techniques during ROM tasks.    PLAN:   OT FREQUENCY: 1x/week  OT DURATION: 4 weeks  PLANNED INTERVENTIONS: self care/ADL training, therapeutic exercise, therapeutic activity, manual therapy, passive range of motion, splinting, electrical stimulation, ultrasound, moist heat, patient/family education, and DME and/or AE instructions  RECOMMENDED OTHER SERVICES: Pt will benefit  from skilled OT services to decrease pain and fascial restrictions, increase joint ROM, strength, and functional use of RUE as dominant.  CONSULTED AND AGREED WITH PLAN OF CARE: Patient  PLAN FOR NEXT SESSION: P: self-stretching for end ROM, RUE strengthening and activity tolerance work     Paulita Fujita, OTR/L [place] Patient [phone]  12/22/2021, 8:22 AM

## 2021-12-25 ENCOUNTER — Ambulatory Visit (HOSPITAL_COMMUNITY): Payer: BC Managed Care – PPO | Admitting: Occupational Therapy

## 2021-12-26 ENCOUNTER — Encounter (HOSPITAL_COMMUNITY): Payer: BC Managed Care – PPO | Admitting: Occupational Therapy

## 2022-01-02 ENCOUNTER — Emergency Department (HOSPITAL_COMMUNITY)
Admission: EM | Admit: 2022-01-02 | Discharge: 2022-01-02 | Disposition: A | Discharge status: Home / Self Care | Payer: BC Managed Care – PPO | Attending: Emergency Medicine | Admitting: Emergency Medicine

## 2022-01-02 ENCOUNTER — Encounter (HOSPITAL_COMMUNITY): Payer: Self-pay

## 2022-01-02 ENCOUNTER — Other Ambulatory Visit: Payer: Self-pay

## 2022-01-02 ENCOUNTER — Telehealth: Payer: Self-pay | Admitting: Orthopedic Surgery

## 2022-01-02 ENCOUNTER — Ambulatory Visit (HOSPITAL_COMMUNITY): Payer: BC Managed Care – PPO | Admitting: Occupational Therapy

## 2022-01-02 DIAGNOSIS — Z79899 Other long term (current) drug therapy: Secondary | ICD-10-CM | POA: Insufficient documentation

## 2022-01-02 DIAGNOSIS — M545 Low back pain, unspecified: Secondary | ICD-10-CM | POA: Diagnosis not present

## 2022-01-02 DIAGNOSIS — I10 Essential (primary) hypertension: Secondary | ICD-10-CM | POA: Diagnosis not present

## 2022-01-02 DIAGNOSIS — L732 Hidradenitis suppurativa: Secondary | ICD-10-CM

## 2022-01-02 LAB — CBC WITH DIFFERENTIAL/PLATELET
Abs Immature Granulocytes: 0.01 10*3/uL (ref 0.00–0.07)
Basophils Absolute: 0 10*3/uL (ref 0.0–0.1)
Basophils Relative: 1 %
Eosinophils Absolute: 0 10*3/uL (ref 0.0–0.5)
Eosinophils Relative: 1 %
HCT: 36.5 % (ref 36.0–46.0)
Hemoglobin: 12.3 g/dL (ref 12.0–15.0)
Immature Granulocytes: 0 %
Lymphocytes Relative: 26 %
Lymphs Abs: 1.6 10*3/uL (ref 0.7–4.0)
MCH: 28.9 pg (ref 26.0–34.0)
MCHC: 33.7 g/dL (ref 30.0–36.0)
MCV: 85.7 fL (ref 80.0–100.0)
Monocytes Absolute: 0.4 10*3/uL (ref 0.1–1.0)
Monocytes Relative: 6 %
Neutro Abs: 4.2 10*3/uL (ref 1.7–7.7)
Neutrophils Relative %: 66 %
Platelets: 381 10*3/uL (ref 150–400)
RBC: 4.26 MIL/uL (ref 3.87–5.11)
RDW: 13 % (ref 11.5–15.5)
WBC: 6.2 10*3/uL (ref 4.0–10.5)
nRBC: 0 % (ref 0.0–0.2)

## 2022-01-02 LAB — URINALYSIS, ROUTINE W REFLEX MICROSCOPIC
Bacteria, UA: NONE SEEN
Bilirubin Urine: NEGATIVE
Glucose, UA: NEGATIVE mg/dL
Ketones, ur: NEGATIVE mg/dL
Leukocytes,Ua: NEGATIVE
Nitrite: NEGATIVE
Protein, ur: NEGATIVE mg/dL
Specific Gravity, Urine: 1.013 (ref 1.005–1.030)
pH: 6 (ref 5.0–8.0)

## 2022-01-02 LAB — BASIC METABOLIC PANEL
Anion gap: 8 (ref 5–15)
BUN: 10 mg/dL (ref 6–20)
CO2: 28 mmol/L (ref 22–32)
Calcium: 9.4 mg/dL (ref 8.9–10.3)
Chloride: 105 mmol/L (ref 98–111)
Creatinine, Ser: 0.79 mg/dL (ref 0.44–1.00)
GFR, Estimated: >60 mL/min (ref >60)
Glucose, Bld: 121 mg/dL — ABNORMAL HIGH (ref 70–99)
Potassium: 3.5 mmol/L (ref 3.5–5.1)
Sodium: 141 mmol/L (ref 135–145)

## 2022-01-02 MED ORDER — DOXYCYCLINE HYCLATE 100 MG PO CAPS
100.0000 mg | ORAL_CAPSULE | Freq: Two times a day (BID) | ORAL | 0 refills | Status: DC
Start: 2022-01-02 — End: 2022-07-03

## 2022-01-02 MED ORDER — KETOROLAC TROMETHAMINE 30 MG/ML IJ SOLN
30.0000 mg | Freq: Once | INTRAMUSCULAR | Status: AC
  Administered 2022-01-02: 30 mg via INTRAMUSCULAR
  Filled 2022-01-02: qty 1

## 2022-01-02 MED ORDER — DOXYCYCLINE HYCLATE 100 MG PO TABS
100.0000 mg | ORAL_TABLET | Freq: Once | ORAL | Status: AC
Start: 2022-01-02 — End: 2022-01-02
  Administered 2022-01-02: 100 mg via ORAL
  Filled 2022-01-02: qty 1

## 2022-01-02 MED ORDER — CLINDAMYCIN PHOSPHATE 1 % EX GEL: Freq: Two times a day (BID) | CUTANEOUS | 0 refills | Status: DC

## 2022-01-02 NOTE — ED Triage Notes (Signed)
Patient complaining of back pain and boils to perineal area that started two days ago. States that she had MRI and was told pain was related to disc disease.

## 2022-01-02 NOTE — ED Provider Notes (Signed)
[facility] EMERGENCY DEPARTMENT Provider Note   CSN: [id number] Arrival date & time: 01/02/22  7062     History  Chief Complaint  Patient presents with   Back Pain   Abscess    Amber Drake is a 61 y.o. female.  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  Pt said her back has been hurting her more than usual.  She also has abscesses in her groin.  She denies any f/c.  No neck pain.  No headaches.       Home Medications Prior to Admission medications   Medication Sig Start Date End Date Taking? Authorizing Provider  atorvastatin (LIPITOR) 20 MG tablet Take 1 tablet (20 mg total) by mouth daily. 03/14/15  Yes Soyla Dryer, PA-C  clindamycin (CLINDAGEL) 1 % gel Apply topically 2 (two) times daily. 01/02/22  Yes Isla Pence, MD  doxycycline (VIBRAMYCIN) 100 MG capsule Take 1 capsule (100 mg total) by mouth 2 (two) times daily. 01/02/22  Yes Isla Pence, MD  lisinopril-hydrochlorothiazide (PRINZIDE,ZESTORETIC) 20-12.5 MG tablet Take 1 tablet by mouth daily.   Yes [provider]  meclizine (ANTIVERT) 12.5 MG tablet Take 1 tablet (12.5 mg total) by mouth 3 (three) times daily as needed for dizziness. 09/25/20  Yes Davonna Belling, MD  Multiple Vitamin (MULTIVITAMIN WITH MINERALS) TABS tablet Take 1 tablet by mouth daily.   Yes [provider]  oxyCODONE (OXY IR/ROXICODONE) 5 MG immediate release tablet Take 5 mg by mouth every 6 (six) hours as needed. 10/02/21  Yes [provider]  potassium chloride SA (KLOR-CON) 20 MEQ tablet TAKE (1) TABLET BY MOUTH TWICE DAILY. 01/31/21  Yes Brunetta Genera, MD  pregabalin (LYRICA) 75 MG capsule Take 75 mg by mouth 2 (two) times daily. 11/17/21  Yes [provider]      Allergies    Penicillins, Penicillins cross reactors, and Tape     Review of Systems   Review of Systems  Musculoskeletal:  Positive for back pain.  Skin:        Groin abscesses  All other systems reviewed and are negative.   Physical Exam Updated Vital Signs BP 134/71   Pulse 80   Temp 97.9 F (36.6 C)   Resp 17   Ht '5\' 4"'$  (1.626 m)   LMP 07/18/2010   SpO2 100%   BMI 29.18 kg/m  Physical Exam Vitals and nursing note reviewed. Exam conducted with a chaperone present.  Constitutional:      Appearance: Normal appearance.  HENT:     Head: Normocephalic and atraumatic.     Right Ear: External ear normal.     Left Ear: External ear normal.     Nose: Nose normal.     Mouth/Throat:     Pharynx: Oropharynx is clear.  Eyes:     Extraocular Movements: Extraocular movements intact.     Conjunctiva/sclera: Conjunctivae normal.     Pupils: Pupils are equal, round, and reactive to light.  Cardiovascular:     Rate and Rhythm: Normal rate and regular rhythm.     Pulses: Normal pulses.     Heart sounds: Normal heart sounds.  Pulmonary:     Effort: Pulmonary effort is normal.     Breath sounds: Normal breath sounds.  Abdominal:     General: Abdomen is flat. Bowel sounds are normal.  Palpations: Abdomen is soft.  Genitourinary:    Comments: Hidradenitis to groin.  Small abscesses.  Nothing fluctuant. Musculoskeletal:        General: Normal range of motion.     Cervical back: Normal range of motion and neck supple.       Back:  Skin:    General: Skin is warm.     Capillary Refill: Capillary refill takes less than 2 seconds.  Neurological:     General: No focal deficit present.     Mental Status: She is alert and oriented to person, place, and time.  Psychiatric:        Mood and Affect: Mood normal.        Behavior: Behavior normal.     ED Results / Procedures / Treatments   Labs (all labs ordered are listed, but only abnormal results are displayed) Labs Reviewed  BASIC METABOLIC PANEL - Abnormal; Notable for the following  components:      Result Value   Glucose, Bld 121 (*)    All other components within normal limits  URINALYSIS, ROUTINE W REFLEX MICROSCOPIC - Abnormal; Notable for the following components:   Hgb urine dipstick MODERATE (*)    All other components within normal limits  CBC WITH DIFFERENTIAL/PLATELET    EKG None  Radiology No results found.  Procedures Procedures    Medications Ordered in ED Medications  ketorolac (TORADOL) 30 MG/ML injection 30 mg (30 mg Intramuscular Given 01/02/22 0809)  doxycycline (VIBRA-TABS) tablet 100 mg (100 mg Oral Given 01/02/22 0808)    ED Course/ Medical Decision Making/ A&P                           Medical Decision Making Amount and/or Complexity of Data Reviewed Labs: ordered.  Risk Prescription drug management.   This patient presents to the ED for concern of back pain and groin absceses, this involves an extensive number of treatment options, and is a complaint that carries with it a high risk of complications and morbidity.  The differential diagnosis includes degenerative disc disease, infection, uti   Co morbidities that complicate the patient evaluation  CNS Extranodal Marginal Zone Lymphoma (in remission and on maintenance Rituxan every 8 weeks), hidradenitis suppurativa, htn, hld, and chronic back pain   Additional history obtained:  Additional history obtained from epic chart review    Lab Tests:  I Ordered, and personally interpreted labs.  The pertinent results include:  cbc nl, bmp nl, ua with some moderate hgb and 11-20 rbcs  Cardiac Monitoring:  The patient was maintained on a cardiac monitor.  I personally viewed and interpreted the cardiac monitored which showed an underlying rhythm of: nsr   Medicines ordered and prescription drug management:  I ordered medication including toradol and doxy  for pain and tx of abscesses  Reevaluation of the patient after these medicines showed that the patient improved I  have reviewed the patients home medicines and have made adjustments as needed  Problem List / ED Course:  Back pain:  chronic issue.  Pt did not want any narcotics here.  She is feeling better after toradol.  She is on oxy at home.  She just filled 120 on 9/26.  She is to continue to take her normal pain meds.  She is able to ambulate.   Hidradenitis:  no abscesses to drain here.  She is d/c on doxy and clinda.  She is to return if worse.  Hematuria:  pt said she's had that in the past.  She said she's been evaluated and is fine.  She is to f/u with uro if hematuria continues.   Reevaluation:  After the interventions noted above, I reevaluated the patient and found that they have :improved   Social Determinants of Health:  Lives at home   Dispostion:  After consideration of the diagnostic results and the patients response to treatment, I feel that the patent would benefit from discharge with outpatient f/u.          Final Clinical Impression(s) / ED Diagnoses Final diagnoses:  Acute bilateral low back pain without sciatica  Hidradenitis suppurativa    Rx / DC Orders ED Discharge Orders          Ordered    doxycycline (VIBRAMYCIN) 100 MG capsule  2 times daily        01/02/22 1044    clindamycin (CLINDAGEL) 1 % gel  2 times daily        01/02/22 1044              Isla Pence, MD 01/02/22 1212

## 2022-01-02 NOTE — Telephone Encounter (Signed)
Patient asked about her disability forms - states update is needed. Amber Drake will call her insurer and request a fax to be sent indicating information needed.

## 2022-01-04 DIAGNOSIS — M5416 Radiculopathy, lumbar region: Secondary | ICD-10-CM | POA: Diagnosis not present

## 2022-01-10 ENCOUNTER — Encounter (HOSPITAL_COMMUNITY): Payer: BC Managed Care – PPO | Admitting: Occupational Therapy

## 2022-01-11 ENCOUNTER — Encounter (HOSPITAL_COMMUNITY): Payer: Self-pay | Admitting: Occupational Therapy

## 2022-01-11 ENCOUNTER — Ambulatory Visit (HOSPITAL_COMMUNITY): Payer: BC Managed Care – PPO | Attending: Orthopedic Surgery | Admitting: Occupational Therapy

## 2022-01-11 DIAGNOSIS — M25511 Pain in right shoulder: Secondary | ICD-10-CM | POA: Diagnosis not present

## 2022-01-11 DIAGNOSIS — M25611 Stiffness of right shoulder, not elsewhere classified: Secondary | ICD-10-CM | POA: Diagnosis not present

## 2022-01-11 DIAGNOSIS — R29898 Other symptoms and signs involving the musculoskeletal system: Secondary | ICD-10-CM | POA: Diagnosis not present

## 2022-01-11 NOTE — Patient Instructions (Signed)

## 2022-01-11 NOTE — Therapy (Signed)
OUTPATIENT OCCUPATIONAL THERAPY REASSESSMENT, DISCHARGE SUMMARY   Patient Name: Amber Drake MRN: [id number] DOB:16-Apr-1960, 61 y.o., female Today's Date: 01/11/2022  PCP: Dr. [name] REFERRING PROVIDER: Dr. Larena Glassman    END OF SESSION:   OT End of Session - 01/11/22 1348     Visit Number 17    Number of Visits 18    Date for OT Re-Evaluation 01/04/22    Authorization Type 1) BCBS Commercial 2) [facility] Medicaid    Authorization Time Period U[employer] Medicaid: No auth required at this time, 27 visits per year combined PT/OT/SP    Authorization - Visit Number 16    Authorization - Number of Visits 27    OT Start Time 1302    OT Stop Time 1340    OT Time Calculation (min) 38 min    Activity Tolerance Patient tolerated treatment well    Behavior During Therapy WFL for tasks assessed/performed               Past Medical History:  Diagnosis Date   Anemia    Arthritis    Blood dyscrasia    sickle cell trait   Cancer ([place])    lymphoma   Carbuncle and furuncle    Hypercholesterolemia    Hypertension    does not take meds   Papanicolaou smear of cervix with positive high risk human papilloma virus (HPV) test 08/23/2020   08/23/20    [place] per ASCCP guidelines, immediate risk of CIN 3+ is 4.1 %   Sickle cell trait ([place])    Past Surgical History:  Procedure Laterality Date   APPENDECTOMY     APPLICATION OF CRANIAL NAVIGATION N/A 07/28/2018   Procedure: APPLICATION OF CRANIAL NAVIGATION;  Surgeon: Kary Kos, MD;  Location: [place];  Service: Neurosurgery;  Laterality: N/A;   ARTHOSCOPIC ROTAOR CUFF REPAIR Right 08/24/2021   Procedure: ARTHROSCOPIC ROTATOR CUFF REPAIR;  Surgeon: Mordecai Rasmussen, MD;  Location: [facility];  Service: Orthopedics;  Laterality: Right;   BRAIN SURGERY  2020   CARPAL TUNNEL RELEASE  03/23/2011   Procedure: CARPAL TUNNEL RELEASE;  Surgeon: Sanjuana Kava;  Location: [facility];  Service: Orthopedics;  Laterality: Left;   CARPAL TUNNEL RELEASE  05/10/2011    Procedure: CARPAL TUNNEL RELEASE;  Surgeon: Sanjuana Kava, MD;  Location: [facility];  Service: Orthopedics;  Laterality: Right;   CESAREAN SECTION     x 2   COLONOSCOPY WITH PROPOFOL N/A 11/16/2020   Procedure: COLONOSCOPY WITH PROPOFOL;  Surgeon: Harvel Quale, MD;  [facility];  Service: Gastroenterology;  Laterality: N/A;  9:15   INCISION AND DRAINAGE PERIRECTAL ABSCESS  12/21/2009   LUMBAR LAMINECTOMY/DECOMPRESSION MICRODISCECTOMY Left 12/12/2020   Procedure: Laminectomy and Foraminotomy - left - L5-S1;  Surgeon: Kary Kos, MD;  Location: [place];  Service: Neurosurgery;  Laterality: Left;  3C   PR DURAL GRAFT REPAIR,SPINE DEFECT N/A 07/28/2018   Procedure: Stereotactic open biopsy of Right cerebellar hemisphere and dura with brainlab;  Surgeon: Kary Kos, MD;  Location: [place];  Service: Neurosurgery;  Laterality: N/A;  Stereotactic open biopsy of Right cerebellar hemisphere and dura with brainlab   PROCTOSCOPY  10/17/2010   Procedure: PROCTOSCOPY;  Surgeon: Scherry Ran;  Location: [facility];  Service: General;  Laterality: N/A;  Rigid Proctoscopy/Possible Fistula in Ano  Procedure ended at [place] Right 08/24/2021   Procedure: SHOULDER ARTHROSCOPY WITH BICEPSTENOTOMY;  Surgeon: Mordecai Rasmussen, MD;  Location: [facility];  Service: Orthopedics;  Laterality: Right;   THERAPEUTIC ABORTION     x2   Patient Active Problem List   Diagnosis Date Noted   Hidradenitis suppurativa 08/18/2021   Spinal stenosis of lumbar region 12/12/2020   Papanicolaou smear of cervix with positive high risk human papilloma virus (HPV) test 08/23/2020   Hemorrhoids 08/16/2020   Encounter for screening fecal occult blood testing 08/16/2020   Encounter for gynecological examination with Papanicolaou smear of cervix 08/16/2020   Allergic reaction to adhesive 08/16/2020   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   Counseling regarding  advance care planning and goals of care 10/23/2018   Diffuse large B cell lymphoma ([place]) 08/11/2018   Brain tumor ([place]) 07/28/2018   ANA positive 03/24/2018   Carpal tunnel syndrome on both sides 03/24/2018   Chronic pain of both shoulders 03/24/2018   Hidradenitis 07/31/2016   Recurrent boils 07/31/2016   Hyperlipidemia 06/15/2015   Pain of left hand 05/19/2015   Hand pain, right 05/19/2015   Hematuria 03/03/2015   Esophageal reflux 03/03/2015   Essential hypertension, benign 03/03/2015   Bronchitis due to tobacco use 10/17/2010    ONSET DATE: 08/24/21  REFERRING DIAG: s/p right RCR  THERAPY DIAG:  Stiffness of right shoulder, not elsewhere classified  Acute pain of right shoulder  Other symptoms and signs involving the musculoskeletal system  Rationale for Evaluation and Treatment Rehabilitation  PERTINENT HISTORY: Pt is a 61 y/o female s/p right RCR on 08/24/21, presenting with RUE functional deficits limiting ability to perform ADLs using RUE as dominant. Presents in sling, pt was referred to occupational therapy for evaluation and treatment by Dr. Larena Glassman.   PRECAUTIONS: see protocol   WEIGHT BEARING RESTRICTIONS Yes NWB  SUBJECTIVE: S: "It's still not right."    PAIN:  Are you having pain? Yes: NPRS scale: 7/10 Pain location: anterior shoulder, bicep tendon, and axillary region Pain description: achy and sharp Aggravating factors: movement  Relieving factors: rest, ice, pain meds  Pain is worse at night     PATIENT GOALS To be able to use her right arm during daily tasks.  OBJECTIVE:   HAND DOMINANCE: Right  FUNCTIONAL OUTCOME MEASURES: FOTO: 4/100 8/3/: FOTO: 52.92 12/05/21: FOTO: 55/100 01/11/22: FOTO 55/100   UPPER EXTREMITY ROM        Assessed seated, er/IR adducted Passive ROM Right eval Right 10/12/21 Right 11/02/21 Right 01/11/22  Shoulder flexion 62 155 161 161  Shoulder abduction 45 120 126 160  Shoulder internal rotation 90 90 90 90   Shoulder external rotation 51 45 75 75  (Blank rows = not tested)  Active ROM Right eval Right 10/12/21 Right 11/02/21 Right 12/05/21 Right 01/11/22  Shoulder flexion  120 146 160 150  Shoulder abduction   73 118 140 118  Shoulder internal rotation  90 90 90 90  Shoulder external rotation  45 73 56 65       UPPER EXTREMITY MMT:      Unable to assess due to precautions MMT Right eval Right  10/12/21 Right 11/02/21 Right 12/05/21 Right 01/11/22  Shoulder flexion   3/5 4/5 4+/5 4+/5  Shoulder abduction   3/5 3+/5 4+/5 5/5  Shoulder internal rotation   3/5 4+/5 4+/5 5/5  Shoulder external rotation   3/5 4/5 4/5 4+/5  (Blank rows = not tested)          GOALS: Goals reviewed with patient? Yes   SHORT TERM GOALS: Target date: 10/05/2021     Pt will be  provided with HEP to improve mobility of RUE required for ADL completion.    Goal status: MET   2.  Pt will increase RUE P/ROM to [facility] to improve ability to perform dressing tasks with minimal compensatory strategies and techniques.    Goal status: MET   3.  Pt will increase RUE strength to 3/5 or greater to improve ability to reach for items at waist to chest height during functional tasks.    Goal status: MET       LONG TERM GOALS: Target date: 11/02/2021     Pt will return to highest level of functioning using RUE as dominant during ADL and work tasks.    Goal status: MET   2.  Pt will decrease pain in RUE to 3/10 or less to improve ability to use RUE as dominant during housework tasks.    Goal status: NOT MET   3.  Pt will decrease RUE fascial restrictions to min amounts or less to improve mobility required for functional reaching tasks at home and work.    Goal status: MET   4.  Pt will increase RUE A/ROM to [facility] to improve ability to reaching overhead and behind back during dressing and bathing tasks.    Goal status: PARTIALLY MET   5.  Pt will increase RUE strength to 4+/5 or greater to improve ability to  perform lifting tasks at work.    Goal status: MET    TODAY'S TREATMENT: 01/11/22 -Extensive education regarding importance of completing HEP and incorporating RUE into ADLs -Extensive education regarding eligibility for therapy including continued progress and regular attendance    12/22/21  -Manual therapy: soft tissue mobilization and myofascial release to right serratus anterior, subscapularis, and axillary regions to address pain and fascial restrictions and increase joint ROM -Wall Washing: forward flexion, abduction, 1x10 with 15 sec hold at end range for stretch -Stretching: extension, pectoralis, latissimus, biceps, er/IR, 3x30" -scapular strengthening: green theraband, extension, retraction, protraction, rows, 1x15 -proximal shoulder strengthening: paddles, criss cross, circles each direction, 2x45" -UBE: Level 2, 3' forward, pace 9.0  12/14/21 -Shoulder strengthening: standing, 1# weight-protraction, flexion, horizontal abduction, er/IR, abduction, 10 reps -proximal shoulder strengthening: paddles, criss cross, circles each direction, 10 reps each -external rotation stretch at wall: 3x10" -x to v arms: 1# weight, 10 reps -Green therapy ball strengthening: chest press, flexion, circles each direction, overhead press, 10 reps each -Ball on wall: 1' flexion, 1' abduction -Overhead lacing" 1# weight, lacing from top down then bottom up -Scapular theraband: green, row, extension, retraction, 10 reps each -UBE: level 1, 3' forward, 3' reverse, pace: 5.5     PATIENT EDUCATION: Education details: discussed regular HEP completion  Person educated: Patient Education method: Explanation, Demonstration, handout  Education comprehension: verbalized understanding, returned demonstration      HOME EXERCISE PROGRAM: Eval: table slides 6/20: Elbow AROM 7/13: Shoulder AA/ROM 8/1: Theraband Scapular Strengthening  8/7: A/ROM  9/5: red theraband strengthening 10/12: A/ROM only     ASSESSMENT:   CLINICAL IMPRESSION: A: Reassessment completed this session. Pt has not been seen since 12/22/21, multiple last minute cancellations due to various family matters. Pt reporting sharp, shooting pains in her RUE, present for approximately 2 weeks; has a hx of high pain throughout therapy. Did use her RUE to catch her husband during a partial fall recently. Pt reports she completes rows with a band and wall washes, does not complete regularly. Non-compliant with other HEP exercises. Pt demonstrating good glenohumeral mobility, P/ROM has maintained or  improved, A/ROM has decreased somewhat for flexion and abduction however is equivalent to LUE and is functional. Pt has met all STGs and 3/5 LTGs with one additional goal partially met. Pt reports she does not feel ready to discharge from therapy because her arm is not "right." Discussed pain management techniques and additional factors to consider when having high pain (increased stress, lack of sleep, etc.). Pt reports she uses heat occasionally. Upon further discussion pt reports she is worried about her disability being discontinued if she is not in PT. Explained to pt that we cannot continue to provide care if we are not making progress and pt is not attending therapy regularly. Pt is able to perform current HEPs with good form and is unable to progress strengthening due to high pain levels. Pt verbalized understanding however visibly upset regarding discharged. Educated pt again on reasons for discharge-meeting goals, progress plateaued due to pain, lack of regular attendance. Pt reports she is going straight to the MD and requesting her next appt be moved up. Recommended pt discuss her sharp pain with the MD and disclose that she used her RUE to catch her husband when he almost fell recently, as additional assessment might be indicated.    PLAN:   OT FREQUENCY: 1x/week  OT DURATION: 4 weeks  PLANNED INTERVENTIONS: self care/ADL  training, therapeutic exercise, therapeutic activity, manual therapy, passive range of motion, splinting, electrical stimulation, ultrasound, moist heat, patient/family education, and DME and/or AE instructions  RECOMMENDED OTHER SERVICES: Pt will benefit from skilled OT services to decrease pain and fascial restrictions, increase joint ROM, strength, and functional use of RUE as dominant.  CONSULTED AND AGREED WITH PLAN OF CARE: Patient  PLAN FOR NEXT SESSION: P: discharge & return to MD for pain management.      Guadelupe Sabin, OTR/L  [phone] 01/11/2022, 1:49 PM    OCCUPATIONAL THERAPY DISCHARGE SUMMARY  Visits from Start of Care: 17  Current functional level related to goals / functional outcomes: See above.    Remaining deficits: Pain in RUE, decreased strength/activity tolerance   Education / Equipment: HEP for A/ROM   Patient agrees to discharge. Patient goals were met. Patient is being discharged due to lack of progress.Marland Kitchen

## 2022-01-29 ENCOUNTER — Telehealth: Payer: Self-pay | Admitting: Orthopedic Surgery

## 2022-02-06 ENCOUNTER — Telehealth: Payer: Self-pay | Admitting: Radiology

## 2022-02-06 ENCOUNTER — Encounter: Payer: Self-pay | Admitting: Orthopedic Surgery

## 2022-02-06 ENCOUNTER — Ambulatory Visit (INDEPENDENT_AMBULATORY_CARE_PROVIDER_SITE_OTHER): Payer: BC Managed Care – PPO | Admitting: Orthopedic Surgery

## 2022-02-06 VITALS — Ht 64.0 in | Wt 167.0 lb

## 2022-02-06 DIAGNOSIS — M25511 Pain in right shoulder: Secondary | ICD-10-CM

## 2022-02-06 DIAGNOSIS — M75111 Incomplete rotator cuff tear or rupture of right shoulder, not specified as traumatic: Secondary | ICD-10-CM | POA: Diagnosis not present

## 2022-02-06 DIAGNOSIS — G8929 Other chronic pain: Secondary | ICD-10-CM | POA: Diagnosis not present

## 2022-02-06 NOTE — Patient Instructions (Signed)

## 2022-02-06 NOTE — Telephone Encounter (Signed)
Documentation has been being sent to [employer].  I even sent the PT notes.  I will resend everything.   Patient wants to know about RTW status.  We have her out through 02/27/22 at this point.  She has a f/u on 02/27/22 as well.  Please advise if she is to continue OOW.

## 2022-02-07 NOTE — Progress Notes (Signed)
Orthopaedic Postop Note  Assessment: Amber Drake is a 61 y.o. female s/p right shoulder arthroscopy, with rotator cuff repair and biceps tenotomy  DOS: 08/24/21  Plan: Amber Drake is continuing to have pain in the right shoulder.  She has excellent range of motion and very good strength.  She states that she is no longer getting narcotic pain medications for her back.  I feel that she has gone on to heal her rotator cuff repair, but is reluctant to try to return to work.  I have offered her a steroid injection for her ongoing shoulder pain, and she elected to proceed.  This was completed in clinic today.  She is currently out of work until the end of this month, and she will return when she is expected to return to work.  Procedure note injection - Right shoulder    Verbal consent was obtained to inject the right shoulder, subacromial space Timeout was completed to confirm the site of injection.   The skin was prepped with alcohol and ethyl chloride was sprayed at the injection site.  A 21-gauge needle was used to inject 40 mg of Depo-Medrol and 1% lidocaine (3 cc) into the subacromial space of the right shoulder using a posterolateral approach.  There were no complications.  A sterile bandage was applied.     Follow-up: Return if symptoms worsen or fail to improve. XR at next visit: None  Subjective:  Chief Complaint  Patient presents with   Right Shoulder - Pain, Routine Post Op    DOS: 08/24/21    History of Present Illness: Amber Drake is a 61 y.o. female who presents following the above stated procedure.  Surgery was approximately 5 months ago.  She is doing well overall, but states she has some pain in her right shoulder.  Physical therapy notes were available in clinic today, and these demonstrate excellent improvements in her range of motion and strength.  Pain is in the lateral aspect of her shoulder.  She is no longer taking narcotic pain medications.  She continues  to do exercises on her own.   Review of Systems: No fevers or chills No numbness or tingling No Chest Pain No shortness of breath   Objective: Ht '5\' 4"'$  (1.626 m)   Wt 167 lb (75.8 kg)   LMP 07/18/2010   BMI 28.67 kg/m   Physical Exam:  Alert and oriented.  No acute distress.  Surgical incisions have healed.  No surrounding erythema or drainage.  Active forward flexion to 170 degrees.  Active internal rotation to the lumbar spine.  Abduction to 100 degrees.  Fingers are warm and well-perfused.  No numbness or tingling in the right hand.  2+ radial pulse.  4+/5 strength in the supraspinatus.  5/5 strength in the remainder of the right shoulder.  IMAGING: I personally ordered and reviewed the following images:  No new Imaging obtained today.   Amber Rasmussen, MD 02/07/2022 8:17 AM

## 2022-02-08 ENCOUNTER — Ambulatory Visit (HOSPITAL_COMMUNITY)
Admission: RE | Admit: 2022-02-08 | Discharge: 2022-02-08 | Disposition: A | Discharge status: Home / Self Care | Payer: BC Managed Care – PPO | Source: Ambulatory Visit | Attending: Hematology | Admitting: Hematology

## 2022-02-08 DIAGNOSIS — Z8572 Personal history of non-Hodgkin lymphomas: Secondary | ICD-10-CM | POA: Diagnosis not present

## 2022-02-08 DIAGNOSIS — C884 Extranodal marginal zone B-cell lymphoma of mucosa-associated lymphoid tissue [MALT-lymphoma]: Secondary | ICD-10-CM | POA: Insufficient documentation

## 2022-02-08 DIAGNOSIS — G930 Cerebral cysts: Secondary | ICD-10-CM | POA: Diagnosis not present

## 2022-02-08 MED ORDER — GADOBUTROL 1 MMOL/ML IV SOLN
7.5000 mL | Freq: Once | INTRAVENOUS | Status: AC | PRN
  Administered 2022-02-08: 7.5 mL via INTRAVENOUS

## 2022-02-12 DIAGNOSIS — M48061 Spinal stenosis, lumbar region without neurogenic claudication: Secondary | ICD-10-CM | POA: Diagnosis not present

## 2022-02-12 DIAGNOSIS — M5416 Radiculopathy, lumbar region: Secondary | ICD-10-CM | POA: Diagnosis not present

## 2022-02-12 DIAGNOSIS — M25511 Pain in right shoulder: Secondary | ICD-10-CM | POA: Diagnosis not present

## 2022-02-12 DIAGNOSIS — F1120 Opioid dependence, uncomplicated: Secondary | ICD-10-CM | POA: Diagnosis not present

## 2022-02-23 ENCOUNTER — Encounter (HOSPITAL_COMMUNITY): Payer: Self-pay

## 2022-02-23 ENCOUNTER — Other Ambulatory Visit: Payer: Self-pay

## 2022-02-23 ENCOUNTER — Emergency Department (HOSPITAL_COMMUNITY)
Admission: EM | Admit: 2022-02-23 | Discharge: 2022-02-23 | Disposition: A | Discharge status: Home / Self Care | Payer: BC Managed Care – PPO | Attending: Emergency Medicine | Admitting: Emergency Medicine

## 2022-02-23 DIAGNOSIS — Z7982 Long term (current) use of aspirin: Secondary | ICD-10-CM | POA: Insufficient documentation

## 2022-02-23 DIAGNOSIS — Z79899 Other long term (current) drug therapy: Secondary | ICD-10-CM | POA: Insufficient documentation

## 2022-02-23 DIAGNOSIS — I10 Essential (primary) hypertension: Secondary | ICD-10-CM | POA: Diagnosis not present

## 2022-02-23 DIAGNOSIS — R1013 Epigastric pain: Secondary | ICD-10-CM | POA: Insufficient documentation

## 2022-02-23 LAB — CBC WITH DIFFERENTIAL/PLATELET
Abs Immature Granulocytes: 0.02 10*3/uL (ref 0.00–0.07)
Basophils Absolute: 0 10*3/uL (ref 0.0–0.1)
Basophils Relative: 0 %
Eosinophils Absolute: 0.1 10*3/uL (ref 0.0–0.5)
Eosinophils Relative: 1 %
HCT: 36 % (ref 36.0–46.0)
Hemoglobin: 12.2 g/dL (ref 12.0–15.0)
Immature Granulocytes: 0 %
Lymphocytes Relative: 36 %
Lymphs Abs: 2.8 10*3/uL (ref 0.7–4.0)
MCH: 28.7 pg (ref 26.0–34.0)
MCHC: 33.9 g/dL (ref 30.0–36.0)
MCV: 84.7 fL (ref 80.0–100.0)
Monocytes Absolute: 0.5 10*3/uL (ref 0.1–1.0)
Monocytes Relative: 6 %
Neutro Abs: 4.4 10*3/uL (ref 1.7–7.7)
Neutrophils Relative %: 57 %
Platelets: 360 10*3/uL (ref 150–400)
RBC: 4.25 MIL/uL (ref 3.87–5.11)
RDW: 13.2 % (ref 11.5–15.5)
WBC: 7.8 10*3/uL (ref 4.0–10.5)
nRBC: 0 % (ref 0.0–0.2)

## 2022-02-23 LAB — COMPREHENSIVE METABOLIC PANEL
ALT: 17 U/L (ref 0–44)
AST: 23 U/L (ref 15–41)
Albumin: 4.1 g/dL (ref 3.5–5.0)
Alkaline Phosphatase: 74 U/L (ref 38–126)
Anion gap: 8 (ref 5–15)
BUN: 15 mg/dL (ref 6–20)
CO2: 32 mmol/L (ref 22–32)
Calcium: 9.5 mg/dL (ref 8.9–10.3)
Chloride: 99 mmol/L (ref 98–111)
Creatinine, Ser: 0.81 mg/dL (ref 0.44–1.00)
GFR, Estimated: >60 mL/min (ref >60)
Glucose, Bld: 96 mg/dL (ref 70–99)
Potassium: 3.4 mmol/L — ABNORMAL LOW (ref 3.5–5.1)
Sodium: 139 mmol/L (ref 135–145)
Total Bilirubin: 0.4 mg/dL (ref 0.3–1.2)
Total Protein: 7 g/dL (ref 6.5–8.1)

## 2022-02-23 LAB — LIPASE, BLOOD: Lipase: 31 U/L (ref 11–51)

## 2022-02-23 MED ORDER — PANTOPRAZOLE SODIUM 40 MG PO TBEC: 40.0000 mg | DELAYED_RELEASE_TABLET | Freq: Every day | ORAL | 1 refills | Status: DC

## 2022-02-23 MED ORDER — PANTOPRAZOLE SODIUM 40 MG PO TBEC
40.0000 mg | DELAYED_RELEASE_TABLET | Freq: Once | ORAL | Status: AC
  Administered 2022-02-23: 40 mg via ORAL
  Filled 2022-02-23: qty 1

## 2022-02-23 NOTE — ED Provider Notes (Signed)
[facility] EMERGENCY DEPARTMENT Provider Note   CSN: [id number] Arrival date & time: 02/23/22  1918     History  Chief Complaint  Patient presents with   Abdominal Pain    Amber Drake is a 61 y.o. female.   Abdominal Pain  This patient is a 61 year old female, she has a history of high cholesterol, hypertension and presents to the hospital today with a complaint of epigastric discomfort.  This seems to come on a couple of times a day, it would last about 30 minutes then it goes away.  She reports that it is mild when it happens but it is unusual for her.  It is not brought on with eating or position or time of day and she is not having it right now.  There is no associated chest pain or shortness of breath, the pain does not radiate to her back or her chest or her shoulders.  She has no dysuria diarrhea or constipation or rectal bleeding.  She does not take any chronic antacid medications but does take the occasional Pepto-Bismol and Alka-Seltzer and does use a lot of over-the-counter painkillers.  She does not take any blood thinners but does take a daily aspirin.  She denies a history of stomach ulcers that she is aware of and has never had pancreatitis nor does she drink alcohol  I have reviewed the medical record, the patient has had a colonoscopy within the last year and a half but has not had any upper endoscopies.     Home Medications Prior to Admission medications   Medication Sig Start Date End Date Taking? Authorizing Provider  pantoprazole (PROTONIX) 40 MG tablet Take 1 tablet (40 mg total) by mouth daily. 02/23/22 04/24/22 Yes Noemi Chapel, MD  atorvastatin (LIPITOR) 20 MG tablet Take 1 tablet (20 mg total) by mouth daily. 03/14/15   Soyla Dryer, PA-C  clindamycin (CLINDAGEL) 1 % gel Apply topically 2 (two) times daily. 01/02/22   Isla Pence, MD  doxycycline (VIBRAMYCIN) 100 MG capsule Take 1 capsule (100 mg total) by mouth 2 (two) times daily. 01/02/22    Isla Pence, MD  lisinopril-hydrochlorothiazide (PRINZIDE,ZESTORETIC) 20-12.5 MG tablet Take 1 tablet by mouth daily.    [provider]  meclizine (ANTIVERT) 12.5 MG tablet Take 1 tablet (12.5 mg total) by mouth 3 (three) times daily as needed for dizziness. 09/25/20   Davonna Belling, MD  Multiple Vitamin (MULTIVITAMIN WITH MINERALS) TABS tablet Take 1 tablet by mouth daily.    [provider]  oxyCODONE (OXY IR/ROXICODONE) 5 MG immediate release tablet Take 5 mg by mouth every 6 (six) hours as needed. 10/02/21   [provider]  potassium chloride SA (KLOR-CON) 20 MEQ tablet TAKE (1) TABLET BY MOUTH TWICE DAILY. 01/31/21   Brunetta Genera, MD  pregabalin (LYRICA) 75 MG capsule Take 75 mg by mouth 2 (two) times daily. 11/17/21   [provider]      Allergies    Penicillins, Penicillins cross reactors, and Tape    Review of Systems   Review of Systems  Gastrointestinal:  Positive for abdominal pain.  All other systems reviewed and are negative.   Physical Exam Updated Vital Signs BP (!) 144/68 (BP Location: Right Arm)   Pulse 67   Temp 98.2 F (36.8 C) (Oral)   Resp 16   Ht 1.626 m ('5\' 4"'$ )   Wt 76.2 kg   LMP 07/18/2010   SpO2 100%   BMI 28.84 kg/m  Physical Exam Vitals  and nursing note reviewed.  Constitutional:      General: She is not in acute distress.    Appearance: She is well-developed.  HENT:     Head: Normocephalic and atraumatic.     Mouth/Throat:     Pharynx: No oropharyngeal exudate.  Eyes:     General: No scleral icterus.       Right eye: No discharge.        Left eye: No discharge.     Conjunctiva/sclera: Conjunctivae normal.     Pupils: Pupils are equal, round, and reactive to light.  Neck:     Thyroid: No thyromegaly.     Vascular: No JVD.  Cardiovascular:     Rate and Rhythm: Normal rate and regular rhythm.     Heart sounds: Normal heart sounds. No murmur heard.    No friction rub. No gallop.  Pulmonary:      Effort: Pulmonary effort is normal. No respiratory distress.     Breath sounds: Normal breath sounds. No wheezing or rales.  Abdominal:     General: Bowel sounds are normal. There is no distension.     Palpations: Abdomen is soft. There is no mass.     Tenderness: There is no abdominal tenderness.  Musculoskeletal:        General: No tenderness. Normal range of motion.     Cervical back: Normal range of motion and neck supple.  Lymphadenopathy:     Cervical: No cervical adenopathy.  Skin:    General: Skin is warm and dry.     Findings: No erythema or rash.  Neurological:     Mental Status: She is alert.     Coordination: Coordination normal.  Psychiatric:        Behavior: Behavior normal.     ED Results / Procedures / Treatments   Labs (all labs ordered are listed, but only abnormal results are displayed) Labs Reviewed  COMPREHENSIVE METABOLIC PANEL - Abnormal; Notable for the following components:      Result Value   Potassium 3.4 (*)    All other components within normal limits  LIPASE, BLOOD  CBC WITH DIFFERENTIAL/PLATELET    EKG EKG Interpretation  Date/Time:  Friday February 23 2022 20:23:05 EST Ventricular Rate:  62 PR Interval:  186 QRS Duration: 91 QT Interval:  405 QTC Calculation: 412 R Axis:   43 Text Interpretation: Sinus rhythm Low voltage, precordial leads Probable anteroseptal infarct, old Baseline wander in lead(s) V5 V6 Confirmed by Noemi Chapel [phone]) on 02/23/2022 8:25:16 PM  Radiology No results found.  Procedures Procedures    Medications Ordered in ED Medications  pantoprazole (PROTONIX) EC tablet 40 mg (40 mg Oral Given 02/23/22 2017)    ED Course/ Medical Decision Making/ A&P                           Medical Decision Making Amount and/or Complexity of Data Reviewed Labs: ordered. ECG/medicine tests: ordered.  Risk Prescription drug management.   This patient presents to the ED for concern of intermittent epigastric  pain, this involves an extensive number of treatment options, and is a complaint that carries with it a high risk of complications and morbidity.  The differential diagnosis includes gastritis, pancreatitis, cholecystitis, less likely to be abdominal aortic aneurysm, could be coronary disease however that is not exertional, will check an EKG and lab work.  She does not need any imaging based on her exam at this time  Co morbidities that complicate the patient evaluation  Hypertension, high cholesterol   Additional history obtained:  Additional history obtained from electronic medical record External records from outside source obtained and reviewed including prior colonoscopy, no signs of endoscopy   Lab Tests:  I Ordered, and personally interpreted labs.  The pertinent results include: CBC metabolic panel and lipase as well as an EKG which shows normal sinus rhythm, no ischemia    Cardiac Monitoring: / EKG:  The patient was maintained on a cardiac monitor.  I personally viewed and interpreted the cardiac monitored which showed an underlying rhythm of: Normal sinus rhythm  Rotations not needed  Problem List / ED Course / Critical interventions / Medication management  Patient remained asymptomatic, started on pantoprazole, labs unremarkable I ordered medication including pantoprazole for acid reflux Reevaluation of the patient after these medicines showed that the patient stable I have reviewed the patients home medicines and have made adjustments as needed   Social Determinants of Health:  None   Test / Admission - Considered:  Considered admission and CT scan but the patient does not need it, she has a totally nontender abdomen with unremarkable vital signs and a negative EKG         Final Clinical Impression(s) / ED Diagnoses Final diagnoses:  Epigastric pain    Rx / DC Orders ED Discharge Orders          Ordered    pantoprazole (PROTONIX) 40 MG tablet   Daily        02/23/22 2100              Noemi Chapel, MD 02/23/22 2103

## 2022-02-23 NOTE — Discharge Instructions (Signed)
Take pantoprazole once a day at the same time every day.  Please avoid medicines that are high in acid, avoid soda, coffee and medications with aspirin or anti-inflammatories.  ER if worse or worsening symptoms

## 2022-02-23 NOTE — ED Triage Notes (Signed)
Pt reports abd pain that has been intermittent for the past few days, pt says she has been taking pepto-bismuth and alka-seltzer and they relieve the pain, but it comes back. Pt says she exlax yesterday and good BM afterwards. Pt reports she has been belching a lot.

## 2022-02-27 ENCOUNTER — Ambulatory Visit (INDEPENDENT_AMBULATORY_CARE_PROVIDER_SITE_OTHER): Payer: BC Managed Care – PPO | Admitting: Orthopedic Surgery

## 2022-02-27 ENCOUNTER — Ambulatory Visit (INDEPENDENT_AMBULATORY_CARE_PROVIDER_SITE_OTHER): Payer: BC Managed Care – PPO

## 2022-02-27 ENCOUNTER — Encounter: Payer: Self-pay | Admitting: Orthopedic Surgery

## 2022-02-27 VITALS — Ht 64.0 in | Wt 168.0 lb

## 2022-02-27 DIAGNOSIS — Z9889 Other specified postprocedural states: Secondary | ICD-10-CM | POA: Diagnosis not present

## 2022-02-27 DIAGNOSIS — M25511 Pain in right shoulder: Secondary | ICD-10-CM

## 2022-02-27 DIAGNOSIS — M79601 Pain in right arm: Secondary | ICD-10-CM

## 2022-02-27 DIAGNOSIS — G8929 Other chronic pain: Secondary | ICD-10-CM | POA: Diagnosis not present

## 2022-02-27 NOTE — Progress Notes (Signed)
Orthopaedic Postop Note  Assessment: Amber Drake is a 61 y.o. female s/p right shoulder arthroscopy, with rotator cuff repair and biceps tenotomy  DOS: 08/24/21  Plan: Amber Drake is continuing to have pain in the right shoulder.  Pain is in the anterior shoulder, and radiating down the arm.  Radiographs of the neck demonstrates good overall alignment, with some evidence of degenerative changes.  She states that the prior injection provided minimal relief of her symptoms.  Since I last saw her in clinic, I feel as though her range of motion and strength has gotten worse.  As result, I am recommending a right shoulder MRI with contrast to evaluate for healing of the rotator cuff repair.  Once the images are available, we will see her in clinic for repeat evaluation.    Follow-up: Return for After MRI. XR at next visit: None  Subjective:  Chief Complaint  Patient presents with   Routine Post Op    Rt RCR DOS 08/22/21    History of Present Illness: Amber Drake is a 61 y.o. female who presents following the above stated procedure.  Surgery was approximately 6 months ago.  She states she continues to have pain, primarily in the anterior aspect of the shoulder.  Pain is radiating distally.  She has had no recent falls or stumbles.  She did not return to work, and has subsequently lost her job.  At the last clinic I injected her right shoulder, and this provided minimal relief of her symptoms.  She is no longer taking pain medications.  Review of Systems: No fevers or chills No numbness or tingling No Chest Pain No shortness of breath   Objective: Ht '5\' 4"'$  (1.626 m)   Wt 168 lb (76.2 kg)   LMP 07/18/2010   BMI 28.84 kg/m   Physical Exam:  Alert and oriented.  No acute distress.  Surgical incisions have healed.  No surrounding erythema or drainage.  Active forward flexion to 170 degrees.  Active internal rotation to the lumbar spine.  Abduction to 100 degrees.  Fingers are  warm and well-perfused.  No numbness or tingling in the right hand.  2+ radial pulse.  4+/5 strength in the supraspinatus.  5/5 strength in the remainder of the right shoulder.  Positive Jobe's.  IMAGING: I personally ordered and reviewed the following images:  X-rays of the cervical spine were obtained in clinic today.  Well-maintained disc heights.  No evidence of anterolisthesis.  There are some anterior osteophytes.  Good overall alignment.  Impression: Cervical spine x-rays with mild degenerative changes  Amber Rasmussen, MD 02/27/2022 9:06 AM

## 2022-02-28 ENCOUNTER — Inpatient Hospital Stay: Payer: BC Managed Care – PPO

## 2022-02-28 ENCOUNTER — Telehealth: Payer: Self-pay | Admitting: Radiology

## 2022-02-28 ENCOUNTER — Inpatient Hospital Stay: Payer: BC Managed Care – PPO | Admitting: Hematology

## 2022-02-28 ENCOUNTER — Encounter: Payer: Self-pay | Admitting: Radiology

## 2022-02-28 NOTE — Telephone Encounter (Signed)
Patient called LMVM asking for [place] to send her office note to the disability company, release on file.  Also asks that we send letter/work note.  I called patient and discussed, will send all.

## 2022-03-01 ENCOUNTER — Other Ambulatory Visit: Payer: Self-pay

## 2022-03-05 ENCOUNTER — Other Ambulatory Visit: Payer: Self-pay

## 2022-03-06 DIAGNOSIS — C884 Extranodal marginal zone B-cell lymphoma of mucosa-associated lymphoid tissue [MALT-lymphoma]: Secondary | ICD-10-CM | POA: Diagnosis not present

## 2022-03-06 DIAGNOSIS — E785 Hyperlipidemia, unspecified: Secondary | ICD-10-CM | POA: Diagnosis not present

## 2022-03-06 DIAGNOSIS — I10 Essential (primary) hypertension: Secondary | ICD-10-CM | POA: Diagnosis not present

## 2022-03-07 ENCOUNTER — Other Ambulatory Visit: Payer: Self-pay | Admitting: Orthopedic Surgery

## 2022-03-07 ENCOUNTER — Other Ambulatory Visit: Payer: Self-pay | Admitting: Radiology

## 2022-03-07 ENCOUNTER — Telehealth: Payer: Self-pay | Admitting: Radiology

## 2022-03-07 DIAGNOSIS — G8929 Other chronic pain: Secondary | ICD-10-CM

## 2022-03-07 MED ORDER — DIAZEPAM 5 MG PO TABS: 5.0000 mg | ORAL_TABLET | Freq: Once | ORAL | 0 refills | Status: AC

## 2022-03-07 NOTE — Telephone Encounter (Signed)
Her Medicaid would not approve MRI at [facility] since they are out of network She will need closed scan / put in for [facility] Can you send in a valium for her for closed scan?

## 2022-03-07 NOTE — Telephone Encounter (Signed)
I left message for patient to advise her  [facility] can not pull up [facility] imaging in their system for the MR arthrogram We need her to go to [place] message to Dr Amedeo Kinsman to send in a valium for her And advised April Pait to call her for appointment scheduling

## 2022-03-21 ENCOUNTER — Telehealth: Payer: Self-pay

## 2022-03-21 NOTE — Telephone Encounter (Signed)
Patient stopped by stating that her forms were received that you faxed, but they didn't get the signature part.  Wants you to please give her a call when you have a moment.

## 2022-03-28 ENCOUNTER — Encounter: Payer: Self-pay | Admitting: Hematology

## 2022-03-29 ENCOUNTER — Other Ambulatory Visit (HOSPITAL_COMMUNITY): Payer: BC Managed Care – PPO

## 2022-03-29 ENCOUNTER — Other Ambulatory Visit: Payer: Self-pay

## 2022-03-29 ENCOUNTER — Ambulatory Visit (HOSPITAL_COMMUNITY): Payer: BC Managed Care – PPO

## 2022-04-03 ENCOUNTER — Ambulatory Visit: Payer: BC Managed Care – PPO | Admitting: Orthopedic Surgery

## 2022-04-03 ENCOUNTER — Other Ambulatory Visit: Payer: Self-pay

## 2022-04-03 DIAGNOSIS — C884 Extranodal marginal zone B-cell lymphoma of mucosa-associated lymphoid tissue [MALT-lymphoma]: Secondary | ICD-10-CM

## 2022-04-04 ENCOUNTER — Inpatient Hospital Stay: Payer: BC Managed Care – PPO | Attending: Hematology

## 2022-04-04 ENCOUNTER — Inpatient Hospital Stay (HOSPITAL_BASED_OUTPATIENT_CLINIC_OR_DEPARTMENT_OTHER): Payer: BC Managed Care – PPO | Admitting: Hematology

## 2022-04-04 VITALS — BP 122/61 | HR 85 | Temp 97.2°F | Resp 17 | Wt 172.0 lb

## 2022-04-04 DIAGNOSIS — C8309 Small cell B-cell lymphoma, extranodal and solid organ sites: Secondary | ICD-10-CM | POA: Insufficient documentation

## 2022-04-04 DIAGNOSIS — D573 Sickle-cell trait: Secondary | ICD-10-CM | POA: Insufficient documentation

## 2022-04-04 DIAGNOSIS — I10 Essential (primary) hypertension: Secondary | ICD-10-CM | POA: Diagnosis not present

## 2022-04-04 DIAGNOSIS — C884 Extranodal marginal zone B-cell lymphoma of mucosa-associated lymphoid tissue [MALT-lymphoma]: Secondary | ICD-10-CM | POA: Diagnosis not present

## 2022-04-04 LAB — CMP (CANCER CENTER ONLY)
ALT: 15 U/L (ref 0–44)
AST: 18 U/L (ref 15–41)
Albumin: 4.3 g/dL (ref 3.5–5.0)
Alkaline Phosphatase: 76 U/L (ref 38–126)
Anion gap: 4 — ABNORMAL LOW (ref 5–15)
BUN: 11 mg/dL (ref 8–23)
CO2: 34 mmol/L — ABNORMAL HIGH (ref 22–32)
Calcium: 10.2 mg/dL (ref 8.9–10.3)
Chloride: 102 mmol/L (ref 98–111)
Creatinine: 0.73 mg/dL (ref 0.44–1.00)
GFR, Estimated: >60 mL/min (ref >60)
Glucose, Bld: 91 mg/dL (ref 70–99)
Potassium: 3.4 mmol/L — ABNORMAL LOW (ref 3.5–5.1)
Sodium: 140 mmol/L (ref 135–145)
Total Bilirubin: 0.3 mg/dL (ref 0.3–1.2)
Total Protein: 6.7 g/dL (ref 6.5–8.1)

## 2022-04-04 LAB — CBC WITH DIFFERENTIAL (CANCER CENTER ONLY)
Abs Immature Granulocytes: 0.02 10*3/uL (ref 0.00–0.07)
Basophils Absolute: 0 10*3/uL (ref 0.0–0.1)
Basophils Relative: 0 %
Eosinophils Absolute: 0.1 10*3/uL (ref 0.0–0.5)
Eosinophils Relative: 1 %
HCT: 37.2 % (ref 36.0–46.0)
Hemoglobin: 12.6 g/dL (ref 12.0–15.0)
Immature Granulocytes: 0 %
Lymphocytes Relative: 30 %
Lymphs Abs: 1.9 10*3/uL (ref 0.7–4.0)
MCH: 28.7 pg (ref 26.0–34.0)
MCHC: 33.9 g/dL (ref 30.0–36.0)
MCV: 84.7 fL (ref 80.0–100.0)
Monocytes Absolute: 0.4 10*3/uL (ref 0.1–1.0)
Monocytes Relative: 7 %
Neutro Abs: 3.8 10*3/uL (ref 1.7–7.7)
Neutrophils Relative %: 62 %
Platelet Count: 377 10*3/uL (ref 150–400)
RBC: 4.39 MIL/uL (ref 3.87–5.11)
RDW: 13 % (ref 11.5–15.5)
WBC Count: 6.1 10*3/uL (ref 4.0–10.5)
nRBC: 0 % (ref 0.0–0.2)

## 2022-04-04 LAB — LACTATE DEHYDROGENASE: LDH: 137 U/L (ref 98–192)

## 2022-04-04 NOTE — Progress Notes (Signed)
[place] OFFICE PROGRESS NOTE DOS 04/04/22   Amber Meiers, MD [address] 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  CURRENT THERAPY: maintenance Rituxan q8weeks  INTERVAL HISTORY:  Amber Drake is here for continued evaluation and management of extranodal marginal zone lymphoma.  Patient was last seen by me on 10/30/21 and complained of some shoulder pain following her rotator cuff surgery done on 08/24/21. She was doing well overall with no medical complaints.  Today she is doing well overall, but is recovering from her rotator cuff surgery, which she had on 08/24/21.  She complains of lower back pain and has finished physical therapy.   She is UTD with her vaccinations, including COVID-19 booster and influenza vaccinations. She has not yet received her RSV vaccination.  She denies any headaches, neck pain, fever, chills, night sweats, unexpected weight loss, abdominal pain, SOB, or leg swelling.  Patient is complaint with all of her medications.    MEDICAL HISTORY: Past Medical History:  Diagnosis Date   Anemia    Arthritis    Blood dyscrasia    sickle cell trait   Cancer ([place])    lymphoma   Carbuncle and furuncle    Hypercholesterolemia    Hypertension    does not take meds   Papanicolaou smear of cervix with positive high risk human papilloma virus (HPV) test 08/23/2020   08/23/20    [place] per ASCCP guidelines, immediate risk of CIN 3+ is 4.1 %   Sickle cell trait ([facility])     ALLERGIES:  is allergic to penicillins, penicillins cross reactors, and tape.  MEDICATIONS:  Current Outpatient Medications  Medication Sig Dispense Refill   atorvastatin (LIPITOR) 20 MG tablet Take 1 tablet (20 mg total) by mouth daily. 90 tablet 2   clindamycin (CLINDAGEL) 1 % gel Apply topically 2 (two) times daily. 30 g 0   doxycycline  (VIBRAMYCIN) 100 MG capsule Take 1 capsule (100 mg total) by mouth 2 (two) times daily. 14 capsule 0   lisinopril-hydrochlorothiazide (PRINZIDE,ZESTORETIC) 20-12.5 MG tablet Take 1 tablet by mouth daily.     meclizine (ANTIVERT) 12.5 MG tablet Take 1 tablet (12.5 mg total) by mouth 3 (three) times daily as needed for dizziness. 8 tablet 0   Multiple Vitamin (MULTIVITAMIN WITH MINERALS) TABS tablet Take 1 tablet by mouth daily.     oxyCODONE (OXY IR/ROXICODONE) 5 MG immediate release tablet Take 5 mg by mouth every 6 (six) hours as needed.     pantoprazole (PROTONIX) 40 MG tablet Take 1 tablet (40 mg total) by mouth daily. 30 tablet 1   potassium chloride SA (KLOR-CON) 20 MEQ tablet TAKE (1) TABLET BY MOUTH TWICE DAILY. 60 tablet 1   pregabalin (LYRICA) 75 MG capsule Take 75 mg by mouth 2 (two) times daily.     No current facility-administered medications for this visit.   Facility-Administered Medications Ordered in Other Visits  Medication Dose Route Frequency Provider Last Rate Last Admin   acetaminophen (TYLENOL) tablet 500 mg  500 mg Oral Once Brunetta Genera, MD       dexamethasone (DECADRON) 10 mg in sodium chloride 0.9 % 50 mL IVPB  10 mg Intravenous Once Brunetta Genera, MD        SURGICAL HISTORY:  Past Surgical History:  Procedure Laterality Date   APPENDECTOMY     APPLICATION OF CRANIAL NAVIGATION N/A 07/28/2018   Procedure: APPLICATION  OF CRANIAL NAVIGATION;  Surgeon: Kary Kos, MD;  Location: [facility];  Service: Neurosurgery;  Laterality: N/A;   ARTHOSCOPIC ROTAOR CUFF REPAIR Right 08/24/2021   Procedure: ARTHROSCOPIC ROTATOR CUFF REPAIR;  Surgeon: Mordecai Rasmussen, MD;  Location: [facility];  Service: Orthopedics;  Laterality: Right;   BRAIN SURGERY  2020   CARPAL TUNNEL RELEASE  03/23/2011   Procedure: CARPAL TUNNEL RELEASE;  Surgeon: Sanjuana Kava;  Location: [facility];  Service: Orthopedics;  Laterality: Left;   CARPAL TUNNEL RELEASE  05/10/2011   Procedure: CARPAL TUNNEL  RELEASE;  Surgeon: Sanjuana Kava, MD;  Location: [facility];  Service: Orthopedics;  Laterality: Right;   CESAREAN SECTION     x 2   COLONOSCOPY WITH PROPOFOL N/A 11/16/2020   Procedure: COLONOSCOPY WITH PROPOFOL;  Surgeon: Harvel Quale, MD;  [facility];  Service: Gastroenterology;  Laterality: N/A;  9:15   INCISION AND DRAINAGE PERIRECTAL ABSCESS  12/21/2009   LUMBAR LAMINECTOMY/DECOMPRESSION MICRODISCECTOMY Left 12/12/2020   Procedure: Laminectomy and Foraminotomy - left - L5-S1;  Surgeon: Kary Kos, MD;  Location: [facility];  Service: Neurosurgery;  Laterality: Left;  3C   PR DURAL GRAFT REPAIR,SPINE DEFECT N/A 07/28/2018   Procedure: Stereotactic open biopsy of Right cerebellar hemisphere and dura with brainlab;  Surgeon: Kary Kos, MD;  Location: [facility];  Service: Neurosurgery;  Laterality: N/A;  Stereotactic open biopsy of Right cerebellar hemisphere and dura with brainlab   PROCTOSCOPY  10/17/2010   Procedure: PROCTOSCOPY;  Surgeon: Scherry Ran;  Location: [facility];  Service: General;  Laterality: N/A;  Rigid Proctoscopy/Possible Fistula in Ano  Procedure ended at [facility] Right 08/24/2021   Procedure: SHOULDER ARTHROSCOPY WITH BICEPSTENOTOMY;  Surgeon: Mordecai Rasmussen, MD;  Location: [facility];  Service: Orthopedics;  Laterality: Right;   THERAPEUTIC ABORTION     x2    REVIEW OF SYSTEMS:   10 Point review of Systems was done is negative except as noted above.   PHYSICAL EXAMINATION:  Blood pressure 122/61, pulse 85, temperature (!) 97.2 F (36.2 C), temperature source Temporal, resp. rate 17, weight 172 lb (78 kg), last menstrual period 07/18/2010, SpO2 100 %. NAD GENERAL:alert, in no acute distress and comfortable SKIN: no acute rashes, no significant lesions EYES: conjunctiva are pink and non-injected, sclera anicteric OROPHARYNX: MMM, no exudates, no oropharyngeal erythema or ulceration NECK: supple, no JVD LYMPH:   no palpable lymphadenopathy in the cervical, axillary or inguinal regions LUNGS: clear to auscultation b/l with normal respiratory effort HEART: regular rate & rhythm ABDOMEN:  normoactive bowel sounds , non tender, not distended. Extremity: no pedal edema PSYCH: alert & oriented x 3 with fluent speech NEURO: no focal motor/sensory deficits   LABORATORY DATA:     Latest Ref Rng & Units 04/04/2022   10:31 AM 02/23/2022    8:10 PM 01/02/2022    8:16 AM  CBC  WBC 4.0 - 10.5 K/uL 6.1  7.8  6.2   Hemoglobin 12.0 - 15.0 g/dL 12.6  12.2  12.3   Hematocrit 36.0 - 46.0 % 37.2  36.0  36.5   Platelets 150 - 400 K/uL 377  360  381     .    Latest Ref Rng & Units 04/04/2022   10:31 AM 02/23/2022    8:10 PM 01/02/2022    8:16 AM  CMP  Glucose 70 - 99 mg/dL 91  96  121   BUN 8 - 23 mg/dL 11  15  10   Creatinine 0.44 - 1.00 mg/dL 0.73  0.81  0.79   Sodium 135 - 145 mmol/L 140  139  141   Potassium 3.5 - 5.1 mmol/L 3.4  3.4  3.5   Chloride 98 - 111 mmol/L 102  99  105   CO2 22 - 32 mmol/L 34  32  28   Calcium 8.9 - 10.3 mg/dL 10.2  9.5  9.4   Total Protein 6.5 - 8.1 g/dL 6.7  7.0    Total Bilirubin 0.3 - 1.2 mg/dL 0.3  0.4    Alkaline Phos 38 - 126 U/L 76  74    AST 15 - 41 U/L 18  23    ALT 0 - 44 U/L 15  17      . Lab Results  Component Value Date   LDH 137 04/04/2022    RADIOGRAPHIC STUDIES:  No results found.   ASSESSMENT/PLAN:   62 y.o. female with   1. 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 The process appears to extend into  the upper cervical canal. Mild obstructive hydrocephalus. Differential includes tumor including metastatic disease and lymphoma. Chronic inflammatory process such as sarcoid is a consideration however chest x-ray negative. TB and atypical infection/fungus also possible. 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 2. Nonspecific focus of increased uptake within the cord extending from T12 to L1. Cannot rule out additional site of CNS lymphoma. Consider further evaluation with contrast enhanced MRI through this area. 3. Aortic Atherosclerosis and Emphysema."   10/16/2018 MRI brain w and w/o contrast revealed "1. Resolved dural mass in the right posterior fossa with minimal smooth dural thickening that may be treatment related. Cerebellar edema and mass effect is also resolved. No new site of disease. 2. 6 mm colloid cyst."   10/20/2018 MRI cervical spine w and w/o contrast revealed "Negative for lymphoma.  No acute abnormality. Central disc protrusion at C5-6 effaces the ventral thecal sac causing mild central canal narrowing. There is also a shallow disc bulge at C6-7 which narrows but does not efface the ventral thecal Sac."   01/13/2019 MRI Brain ([id number]) revealed "1. Stable and satisfactory post treatment appearance of the posterior fossa. 2. Stable small 6 mm colloid cyst. 3. No new intracranial abnormality."   05/06/2019 MRI Cervical Spine ([id number]) revealed "No evidence of lymphoma in the cervical spine. Previously noted dural enhancement in the posterior fossa and cervical canal has resolved. No cord compression or cord lesion. Chronic cervical spine degenerative changes are stable from the prior study."   12/15/2019 MRI  Brain ([id number]) revealed "No acute intracranial abnormality. No enhancing mass lesion 5 mm colloid cyst in the third ventricle is chronic and unchanged from prior studies."  08/14/2020, MR Brain ([id number]) which revealed "1. Unchanged appearance of the brain. No evidence of recurrent intracranial lymphoma. 2. Small colloid cyst without  hydrocephalus."   06/22/2021 MRI brain- Stable MRI. No recurrent mass in the right posterior fossa at the site of prior craniotomy.   PLAN: -Patient's lab results done today were discussed in detail with her CBC within normal limits CMP unremarkable LDH normal -Patient has no lab or clinical findings suggestive of progression of her marginal zone lymphoma. -recommended patient to receive RSV vaccine. -Discussed the recent MRI results which did not show any abnormalities.   FOLLOW UP: RTC with Dr Irene Limbo with labs in 24 weeks  The total time spent in the appointment was 20 minutes* .  All of the patient's questions were answered with apparent satisfaction. The patient knows to call the clinic with any problems, questions or concerns.   Sullivan Lone MD MS AAHIVMS [facility] Hematology/Oncology Physician [facility]  .*Total Encounter Time as defined by the Centers for Medicare and Medicaid Services includes, in addition to the face-to-face time of a patient visit (documented in the note above) non-face-to-face time: obtaining and reviewing outside history, ordering and reviewing medications, tests or procedures, care coordination (communications with other health care professionals or caregivers) and documentation in the medical record.   I,Mitra Faeizi,acting as a Education administrator for Sullivan Lone, MD.,have documented all relevant documentation on the behalf of Sullivan Lone, MD,as directed by  Sullivan Lone, MD while in the presence of Sullivan Lone, MD.  .I have reviewed the above documentation for accuracy and completeness, and I agree with the  above. Brunetta Genera MD

## 2022-04-06 ENCOUNTER — Other Ambulatory Visit: Payer: Self-pay

## 2022-04-06 ENCOUNTER — Telehealth: Payer: Self-pay | Admitting: Radiology

## 2022-04-06 NOTE — Telephone Encounter (Signed)
Please call her back, she is asking for dates of last and next visits.  Thanks.

## 2022-04-10 ENCOUNTER — Encounter: Payer: Self-pay | Admitting: Hematology

## 2022-04-17 DIAGNOSIS — Z111 Encounter for screening for respiratory tuberculosis: Secondary | ICD-10-CM | POA: Diagnosis not present

## 2022-04-19 DIAGNOSIS — M5416 Radiculopathy, lumbar region: Secondary | ICD-10-CM | POA: Diagnosis not present

## 2022-04-19 DIAGNOSIS — R7611 Nonspecific reaction to tuberculin skin test without active tuberculosis: Secondary | ICD-10-CM | POA: Diagnosis not present

## 2022-04-19 DIAGNOSIS — Z6825 Body mass index (BMI) 25.0-25.9, adult: Secondary | ICD-10-CM | POA: Diagnosis not present

## 2022-04-19 DIAGNOSIS — M48061 Spinal stenosis, lumbar region without neurogenic claudication: Secondary | ICD-10-CM | POA: Diagnosis not present

## 2022-04-19 DIAGNOSIS — F1120 Opioid dependence, uncomplicated: Secondary | ICD-10-CM | POA: Diagnosis not present

## 2022-04-24 ENCOUNTER — Ambulatory Visit (HOSPITAL_COMMUNITY): Payer: BC Managed Care – PPO

## 2022-04-24 ENCOUNTER — Other Ambulatory Visit (HOSPITAL_COMMUNITY): Payer: BC Managed Care – PPO

## 2022-05-14 ENCOUNTER — Telehealth: Payer: Self-pay | Admitting: Orthopedic Surgery

## 2022-05-14 NOTE — Telephone Encounter (Signed)
[facility] w[facility] called Amy about this patient.  [phone] ext [phone]

## 2022-05-14 NOTE — Telephone Encounter (Signed)
I advised patient she can't have the study until medicaid approves again the previous approval expired. She voiced understanding

## 2022-05-14 NOTE — Telephone Encounter (Signed)
Its pending now the old auth expired  Messaged April to advise.

## 2022-05-14 NOTE — Telephone Encounter (Signed)
[id number] is the [facility] auth number I called Tonya to advise.

## 2022-05-15 ENCOUNTER — Ambulatory Visit (HOSPITAL_COMMUNITY): Payer: BC Managed Care – PPO

## 2022-05-15 ENCOUNTER — Encounter (HOSPITAL_COMMUNITY): Payer: Self-pay

## 2022-05-31 ENCOUNTER — Encounter: Payer: Self-pay | Admitting: Radiology

## 2022-06-19 ENCOUNTER — Ambulatory Visit: Payer: BC Managed Care – PPO | Admitting: Orthopedic Surgery

## 2022-06-20 ENCOUNTER — Emergency Department (HOSPITAL_COMMUNITY): Payer: Medicaid Other

## 2022-06-20 ENCOUNTER — Emergency Department (HOSPITAL_COMMUNITY)
Admission: EM | Admit: 2022-06-20 | Discharge: 2022-06-20 | Disposition: A | Discharge status: Home / Self Care | Payer: Medicaid Other | Attending: Emergency Medicine | Admitting: Emergency Medicine

## 2022-06-20 ENCOUNTER — Encounter (HOSPITAL_COMMUNITY): Payer: Self-pay | Admitting: *Deleted

## 2022-06-20 ENCOUNTER — Other Ambulatory Visit: Payer: Self-pay

## 2022-06-20 DIAGNOSIS — R42 Dizziness and giddiness: Secondary | ICD-10-CM | POA: Diagnosis not present

## 2022-06-20 LAB — CBC
HCT: 39 % (ref 36.0–46.0)
Hemoglobin: 13 g/dL (ref 12.0–15.0)
MCH: 28.8 pg (ref 26.0–34.0)
MCHC: 33.3 g/dL (ref 30.0–36.0)
MCV: 86.3 fL (ref 80.0–100.0)
Platelets: 277 10*3/uL (ref 150–400)
RBC: 4.52 MIL/uL (ref 3.87–5.11)
RDW: 12.7 % (ref 11.5–15.5)
WBC: 7.4 10*3/uL (ref 4.0–10.5)
nRBC: 0 % (ref 0.0–0.2)

## 2022-06-20 LAB — BASIC METABOLIC PANEL
Anion gap: 9 (ref 5–15)
BUN: 10 mg/dL (ref 8–23)
CO2: 25 mmol/L (ref 22–32)
Calcium: 9.7 mg/dL (ref 8.9–10.3)
Chloride: 101 mmol/L (ref 98–111)
Creatinine, Ser: 0.7 mg/dL (ref 0.44–1.00)
GFR, Estimated: >60 mL/min (ref >60)
Glucose, Bld: 102 mg/dL — ABNORMAL HIGH (ref 70–99)
Potassium: 3.6 mmol/L (ref 3.5–5.1)
Sodium: 135 mmol/L (ref 135–145)

## 2022-06-20 MED ORDER — MECLIZINE HCL 25 MG PO TABS: 25.0000 mg | ORAL_TABLET | Freq: Three times a day (TID) | ORAL | 0 refills | Status: DC | PRN

## 2022-06-20 MED ORDER — GADOBUTROL 1 MMOL/ML IV SOLN
7.5000 mL | Freq: Once | INTRAVENOUS | Status: AC | PRN
  Administered 2022-06-20: 7.5 mL via INTRAVENOUS

## 2022-06-20 MED ORDER — SODIUM CHLORIDE 0.9 % IV BOLUS
1000.0000 mL | Freq: Once | INTRAVENOUS | Status: AC
  Administered 2022-06-20: 1000 mL via INTRAVENOUS

## 2022-06-20 NOTE — ED Triage Notes (Signed)
Pt with intermittent dizziness since last night, "feels like inside my head is spinning".  Pt with hx of vertigo and took medication meclizine PTA.

## 2022-06-20 NOTE — ED Notes (Signed)
See triage notes. Denies n/v/d/vision changes. Nad.

## 2022-06-20 NOTE — ED Notes (Signed)
Pt ambulated with steady gait to bathroom and denied any dizziness. Pt states she feels much better. Pa aware

## 2022-06-20 NOTE — ED Provider Notes (Signed)
[place] Provider Note   CSN: [id number] Arrival date & time: 06/20/22  1201     History  Chief Complaint  Patient presents with   Dizziness    Amber Drake is a 62 y.o. 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  She reports that it feels slightly similar to previous vertigo but is not positional in nature.  She took meclizine prior to arrival and reports no significant improvement.  She denies any numbness, tingling, weakness, chest pain, shortness of breath.  Patient reports that she also had some low potassium previously and had been taking 2 potassium tablets for the last several days instead of 1, 1 to make sure that her potassium is not too high.   Dizziness      Home Medications Prior to Admission medications   Medication Sig Start Date End Date Taking? Authorizing Provider  atorvastatin (LIPITOR) 20 MG tablet Take 1 tablet (20 mg total) by mouth daily. 03/14/15  Yes Soyla Dryer, PA-C  lisinopril-hydrochlorothiazide (PRINZIDE,ZESTORETIC) 20-12.5 MG tablet Take 1 tablet by mouth daily.   Yes [provider]  meclizine (ANTIVERT) 25 MG tablet Take 1 tablet (25 mg total) by mouth 3 (three) times daily as needed for dizziness. 06/20/22  Yes Square Jowett H, PA-C  Multiple Vitamin (MULTIVITAMIN WITH MINERALS) TABS tablet Take 1 tablet by mouth daily.   Yes [provider]  oxyCODONE (OXY IR/ROXICODONE) 5 MG immediate release tablet Take 5 mg by mouth every 6 (six) hours as needed. 10/02/21  Yes [provider]  potassium chloride SA (KLOR-CON) 20 MEQ tablet TAKE (1) TABLET BY MOUTH TWICE DAILY. 01/31/21  Yes Brunetta Genera, MD  clindamycin (CLINDAGEL) 1 % gel Apply topically 2 (two) times  daily. Patient not taking: Reported on 06/20/2022 01/02/22   Isla Pence, MD  doxycycline (VIBRAMYCIN) 100 MG capsule Take 1 capsule (100 mg total) by mouth 2 (two) times daily. Patient not taking: Reported on 06/20/2022 01/02/22   Isla Pence, MD  pantoprazole (PROTONIX) 40 MG tablet Take 1 tablet (40 mg total) by mouth daily. 02/23/22 04/24/22  Noemi Chapel, MD      Allergies    Penicillins, Penicillins cross reactors, and Tape    Review of Systems   Review of Systems  Neurological:  Positive for dizziness.  All other systems reviewed and are negative.   Physical Exam Updated Vital Signs BP 136/82   Pulse 66   Temp 98 F (36.7 C) (Oral)   Resp 15   Ht 5\' 4"  (1.626 m)   Wt 77.1 kg   LMP 07/18/2010   SpO2 99%   BMI 29.18 kg/m  Physical Exam Vitals and nursing note reviewed.  Constitutional:      General: She is not in acute distress.    Appearance: Normal appearance.  HENT:     Head: Normocephalic and atraumatic.  Eyes:     General:        Right eye: No discharge.        Left eye: No discharge.  Cardiovascular:     Rate and Rhythm: Normal rate and regular rhythm.  Pulmonary:     Effort: Pulmonary effort is normal. No respiratory distress.  Musculoskeletal:        General: No deformity.  Skin:    General: Skin is  warm and dry.  Neurological:     Mental Status: She is alert and oriented to person, place, and time.     Comments: Cranial nerves II through XII grossly intact.  Intact finger-nose, intact heel-to-shin.  Romberg negative, gait normal.  Alert and oriented x3.  Moves all 4 limbs spontaneously, normal coordination.  No pronator drift.  Intact strength 5 out of 5 bilateral upper and lower extremities.    Psychiatric:        Mood and Affect: Mood normal.        Behavior: Behavior normal.     ED Results / Procedures / Treatments   Labs (all labs ordered are listed, but only abnormal results are displayed) Labs Reviewed  BASIC METABOLIC PANEL -  Abnormal; Notable for the following components:      Result Value   Glucose, Bld 102 (*)    All other components within normal limits  CBC    EKG EKG Interpretation  Date/Time:  Wednesday June 20 2022 12:55:53 EDT Ventricular Rate:  68 PR Interval:  174 QRS Duration: 92 QT Interval:  380 QTC Calculation: 405 R Axis:   67 Text Interpretation: Sinus rhythm Low voltage, precordial leads Anteroseptal infarct, old Confirmed by Dorie Rank [phone]) on 06/20/2022 12:57:53 PM  Radiology MR Brain W and Wo Contrast  Result Date: 06/20/2022 CLINICAL DATA:  Metastatic disease evaluation EXAM: MRI HEAD WITHOUT AND WITH CONTRAST TECHNIQUE: Multiplanar, multiecho pulse sequences of the brain and surrounding structures were obtained without and with intravenous contrast. CONTRAST:  7.66mL GADAVIST GADOBUTROL 1 MMOL/ML IV SOLN COMPARISON:  MRI February 08, 2022. FINDINGS: Brain: No acute infarction, hemorrhage, hydrocephalus, extra-axial collection or mass lesion. No pathologic enhancement. Vascular: Major arterial flow voids are maintained at the skull base. Skull and upper cervical spine: Prior craniotomy overlying the right cerebellum, chronic. Otherwise, unremarkable marrow signal. Sinuses/Orbits: Clear sinuses.  No acute orbital findings. Other: No mastoid effusions. IMPRESSION: 1. No evidence of acute intracranial abnormality or metastatic disease. 2. 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 Electronically Signed   By: Margaretha Sheffield M.D.   On: 06/20/2022 15:16    Procedures Procedures    Medications Ordered in ED Medications  sodium chloride 0.9 % bolus 1,000 mL (0 mLs Intravenous Stopped 06/20/22 1500)  gadobutrol (GADAVIST) 1 MMOL/ML injection 7.5 mL (7.5 mLs Intravenous Contrast Given 06/20/22 1511)    ED Course/ Medical Decision Making/ A&P                             Medical Decision Making Amount and/or Complexity of Data Reviewed Labs: ordered. Radiology:  ordered.  Risk Prescription drug management.   This patient is a 62 y.o. female  who presents to the ED for concern of dizziness.   Differential diagnoses prior to evaluation: The emergent differential diagnosis includes, but is not limited to,  BPPV, vestibular migraine, head trauma, AVM, intracranial tumor, multiple sclerosis, drug-related, CVA, vasovagal syncope, orthostatic hypotension, sepsis, hypoglycemia, electrolyte disturbance, anemia, anxiety/panic attack . This is not an exhaustive differential.   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 Pertinent results include: Reviewed lab work, imaging from previous oncology evaluations, and emergency department visits  Physical Exam: Physical exam performed. The pertinent findings include: Patient with no focal neurologic deficits, she is normal heart rate and rhythm, she is overall well-appearing with stable  vital signs  Lab Tests/Imaging studies: I personally interpreted labs/imaging and the pertinent results include: CBC unremarkable, notably without anemia, BMP notable for mild hyperglycemia, glucose 102.  MR brain with without contrast is without any evidence of recurrent cancer, she has a 5 mm colloid cyst on the foramen of Monro which is unchanged from previous.  I agree with the radiologist interpretation.  Cardiac monitoring: EKG obtained and interpreted by my attending physician which shows: NSR   Medications: I ordered medication including fluid bolus.  I have reviewed the patients home medicines and have made adjustments as needed.   Disposition: After consideration of the diagnostic results and the patients response to treatment, I feel that patient with unexplained cause of vertiginous symptoms, she is previous history of BPPV, although her symptoms today are not significantly positional, this is likely to be a  peripheral cause of vertigo especially with a clear MRI today..   emergency department workup does not suggest an emergent condition requiring admission or immediate intervention beyond what has been performed at this time. The plan is: as above. The patient is safe for discharge and has been instructed to return immediately for worsening symptoms, change in symptoms or any other concerns.  Final Clinical Impression(s) / ED Diagnoses Final diagnoses:  Dizziness  Vertigo    Rx / DC Orders ED Discharge Orders          Ordered    meclizine (ANTIVERT) 25 MG tablet  3 times daily PRN        06/20/22 1533              Amber Drake, Amber Drake 06/20/22 1537    Dorie Rank, MD 06/21/22 [phone]

## 2022-06-20 NOTE — ED Notes (Signed)
Pt taken to MRI  

## 2022-06-20 NOTE — ED Notes (Signed)
Pt returned from MRI. Nad. No changes.

## 2022-06-22 ENCOUNTER — Other Ambulatory Visit: Payer: Self-pay | Admitting: Neurosurgery

## 2022-06-22 DIAGNOSIS — M48062 Spinal stenosis, lumbar region with neurogenic claudication: Secondary | ICD-10-CM

## 2022-07-03 ENCOUNTER — Ambulatory Visit: Payer: Medicaid Other | Admitting: Orthopedic Surgery

## 2022-07-03 ENCOUNTER — Encounter: Payer: Self-pay | Admitting: Orthopedic Surgery

## 2022-07-03 DIAGNOSIS — G8929 Other chronic pain: Secondary | ICD-10-CM

## 2022-07-03 DIAGNOSIS — M25511 Pain in right shoulder: Secondary | ICD-10-CM

## 2022-07-03 DIAGNOSIS — Z9889 Other specified postprocedural states: Secondary | ICD-10-CM

## 2022-07-03 NOTE — Patient Instructions (Signed)
While we are working on your approval for MRI please go ahead and call to schedule your appointment with [facility] within at least one (1) week.  ° °[facility] [phone]  ° °

## 2022-07-04 NOTE — Progress Notes (Signed)
Orthopaedic Postop Note  Assessment: Amber Drake is a 62 y.o. female s/p right shoulder arthroscopy, with rotator cuff repair and biceps tenotomy  DOS: 08/24/21  Plan: [name] is continuing to have pain in the right shoulder.  The pain is intermittent.  On physical exam, she has good strength, as well as good range of motion.  She endorses some pain, primarily in the posterior aspect of her shoulder, over the shoulder blade, and some pain in the anterior aspect of the shoulder.  She is yet to obtain an MRI with contrast, to evaluate the rotator cuff repair.  Unfortunately, her authorization has expired, and she has been unable to obtain the MRI.  She is not interested in another injection.  She continues to take narcotics, provided to her by her primary care doctor.  We will obtain the MR arthrogram of the right shoulder, and once the results are available, we will meet to discuss the findings.    Follow-up: Return for After MRI. XR at next visit: None  Subjective:  Chief Complaint  Patient presents with   Shoulder Pain    Right/ did not have the MRI scan, by the time she went for the study authorization had expired she wants ordered again     History of Present Illness: Amber Drake is a 62 y.o. female who presents following the above stated procedure.  Surgery was approximately 10 months ago.  She continues to have pain in the anterior aspect of the right shoulder.  Pain is intermittent.  Pain is not caused by specific motions.  She has pain in the posterior aspect of her shoulder, as well as the anterior aspect the shoulder.  She is no longer working physical therapy.  She states that she is taking oxycodone, provided to her for her back pain.  She did not get the MRI, as she had some issues with scheduling, and her authorization has since expired.  I have not seen her in over 4 months.   Review of Systems: No fevers or chills No numbness or tingling No Chest Pain No  shortness of breath   Objective: LMP 07/18/2010   Physical Exam:  Alert and oriented.  No acute distress.  Surgical incisions have healed.  No surrounding erythema or drainage.  Active forward flexion to 170 degrees.  Active internal rotation to the lumbar spine.  Abduction to 100 degrees.  Fingers are warm and well-perfused.  No numbness or tingling in the right hand.  2+ radial pulse.  4+/5 strength in the supraspinatus.  5/5 strength in the remainder of the right shoulder.  Positive Jobe's.  IMAGING: I personally ordered and reviewed the following images:  No new imaging obtained today.  Mordecai Rasmussen, MD 07/04/2022 2:17 PM

## 2022-07-05 ENCOUNTER — Telehealth: Payer: Self-pay | Admitting: Orthopedic Surgery

## 2022-07-05 NOTE — Telephone Encounter (Signed)
Dr. Amedeo Kinsman pt - spoke w/the patient, she was returning Leta's call.  The patient stated that this is not WC, she has Medicaid [facility] and that she has not received a new insurance card.

## 2022-07-05 NOTE — Telephone Encounter (Signed)
MRI submitted and approved, pt notified.

## 2022-07-05 NOTE — Telephone Encounter (Signed)
[facility] Medicaid plan approved study last time, but patient went after it expired, so needs to be redone. I think I may have put the [facility] information in the last order because I was confused /FYI

## 2022-07-17 ENCOUNTER — Emergency Department (HOSPITAL_COMMUNITY)
Admission: EM | Admit: 2022-07-17 | Discharge: 2022-07-17 | Disposition: A | Discharge status: Home / Self Care | Payer: Medicaid Other | Attending: Emergency Medicine | Admitting: Emergency Medicine

## 2022-07-17 ENCOUNTER — Encounter (HOSPITAL_COMMUNITY): Payer: Self-pay

## 2022-07-17 DIAGNOSIS — L02214 Cutaneous abscess of groin: Secondary | ICD-10-CM | POA: Diagnosis not present

## 2022-07-17 DIAGNOSIS — L0291 Cutaneous abscess, unspecified: Secondary | ICD-10-CM

## 2022-07-17 DIAGNOSIS — I10 Essential (primary) hypertension: Secondary | ICD-10-CM | POA: Insufficient documentation

## 2022-07-17 DIAGNOSIS — Z79899 Other long term (current) drug therapy: Secondary | ICD-10-CM | POA: Insufficient documentation

## 2022-07-17 DIAGNOSIS — N764 Abscess of vulva: Secondary | ICD-10-CM | POA: Insufficient documentation

## 2022-07-17 MED ORDER — ONDANSETRON HCL 4 MG PO TABS: 4.0000 mg | ORAL_TABLET | Freq: Four times a day (QID) | ORAL | 0 refills | Status: DC

## 2022-07-17 MED ORDER — DOXYCYCLINE HYCLATE 100 MG PO TABS
100.0000 mg | ORAL_TABLET | Freq: Once | ORAL | Status: AC
  Administered 2022-07-17: 100 mg via ORAL
  Filled 2022-07-17: qty 1

## 2022-07-17 MED ORDER — DOXYCYCLINE HYCLATE 100 MG PO CAPS: 100.0000 mg | ORAL_CAPSULE | Freq: Two times a day (BID) | ORAL | 0 refills | Status: DC

## 2022-07-17 MED ORDER — ONDANSETRON 8 MG PO TBDP
8.0000 mg | ORAL_TABLET | Freq: Once | ORAL | Status: AC
  Administered 2022-07-17: 8 mg via ORAL
  Filled 2022-07-17: qty 1

## 2022-07-17 NOTE — ED Provider Notes (Signed)
[place] EMERGENCY DEPARTMENT AT [facility] Provider Note   CSN: [id number] Arrival date & time: 07/17/22  0857     History  Chief Complaint  Patient presents with   Abscess    Amber Drake is a 62 y.o. female.   Abscess Associated symptoms: nausea   Associated symptoms: no fever and no vomiting        Amber Drake is a 62 y.o. female with past medical history of hypertension, sickle cell trait, recurrent boils who presents to the Emergency Department complaining of boil to her right groin area x 1 week.  States that it has been waxing and waning in severity and itching.  Initially area drained but no longer draining.  Symptoms have been associated with nausea but she denies abdominal pain, vomiting, fever, chills, redness to the affected area, difficulty with urination or defecation.  Home Medications Prior to Admission medications   Medication Sig Start Date End Date Taking? Authorizing Provider  atorvastatin (LIPITOR) 20 MG tablet Take 1 tablet (20 mg total) by mouth daily. 03/14/15   Jacquelin Hawking, PA-C  lisinopril-hydrochlorothiazide (PRINZIDE,ZESTORETIC) 20-12.5 MG tablet Take 1 tablet by mouth daily.    [provider]  meclizine (ANTIVERT) 25 MG tablet Take 1 tablet (25 mg total) by mouth 3 (three) times daily as needed for dizziness. 06/20/22   Prosperi, Christian H, PA-C  Multiple Vitamin (MULTIVITAMIN WITH MINERALS) TABS tablet Take 1 tablet by mouth daily.    [provider]  oxyCODONE (OXY IR/ROXICODONE) 5 MG immediate release tablet Take 5 mg by mouth every 6 (six) hours as needed. 10/02/21   [provider]  pantoprazole (PROTONIX) 40 MG tablet Take 1 tablet (40 mg total) by mouth daily. 02/23/22 04/24/22  Eber Hong, MD  potassium chloride SA (KLOR-CON) 20 MEQ tablet TAKE (1) TABLET BY MOUTH TWICE DAILY. 01/31/21   Johney Maine, MD      Allergies    Penicillins, Penicillins cross reactors, and Tape     Review of Systems   Review of Systems  Constitutional:  Negative for chills and fever.  Respiratory:  Negative for cough and shortness of breath.   Cardiovascular:  Negative for chest pain.  Gastrointestinal:  Positive for nausea. Negative for abdominal pain, diarrhea and vomiting.  Genitourinary:  Negative for difficulty urinating and flank pain.  Musculoskeletal:  Negative for arthralgias and myalgias.  Skin:  Negative for rash.       Abscess to right groin  Neurological:  Negative for weakness and numbness.  Psychiatric/Behavioral:  Negative for confusion.     Physical Exam Updated Vital Signs BP 126/68   Pulse 61   Temp 97.8 F (36.6 C)   Ht  (1.702 m)   Wt 77.6 kg   LMP 07/18/2010   SpO2 99%   BMI 26.78 kg/m  Physical Exam Vitals and nursing note reviewed.  Constitutional:      Appearance: Normal appearance.  Cardiovascular:     Rate and Rhythm: Normal rate and regular rhythm.     Pulses: Normal pulses.  Pulmonary:     Effort: Pulmonary effort is normal.     Breath sounds: Normal breath sounds.  Abdominal:     General: There is no distension.     Palpations: Abdomen is soft.     Tenderness: There is no abdominal tenderness.  Musculoskeletal:        General: Normal range of motion.  Skin:    General: Skin is warm.  Capillary Refill: Capillary refill takes less than 2 seconds.     Comments: 1 cm flesh colored papule to the right labia majora.  No appreciable erythema, no drainage, or flutuance  Neurological:     General: No focal deficit present.     Mental Status: She is alert.     Sensory: No sensory deficit.     Motor: No weakness.     ED Results / Procedures / Treatments   Labs (all labs ordered are listed, but only abnormal results are displayed) Labs Reviewed - No data to display  EKG None  Radiology No results found.  Procedures Procedures    Medications Ordered in ED Medications  doxycycline (VIBRA-TABS) tablet 100 mg (has  no administration in time range)  ondansetron (ZOFRAN-ODT) disintegrating tablet 8 mg (has no administration in time range)    ED Course/ Medical Decision Making/ A&P                             Medical Decision Making Patient here with history of recurrent boils, has waxing and waning bowel of the right labia.  No surrounding erythema.  No fluctuance.  She also endorses nausea but no vomiting and denies abdominal pain fever or chills.   Clinically, I suspect this is recurrent boil, it is 1 cm in size.  There is no surrounding erythema or fluctuance noted.  Lymphadenopathy also considered but felt less likely.  Patient well-appearing nontoxic.  Vital signs reassuring.  Patient declined to have I&D.  Stated that her boils usually improve after antibiotics and warm water soaks.  Amount and/or Complexity of Data Reviewed Discussion of management or test interpretation with external provider(s): Will treat with short course of Zofran for her nausea.  Prescription written for doxycycline.  She will continue warm water soaks and close outpatient follow-up.  Return precautions were discussed.  Risk Prescription drug management.           Final Clinical Impression(s) / ED Diagnoses Final diagnoses:  Abscess    Rx / DC Orders ED Discharge Orders     None         Rosey Bath 07/17/22 1026    Gerhard Munch, MD 07/18/22 1750

## 2022-07-17 NOTE — ED Triage Notes (Signed)
Pt c/o right pelvic abscess (hx of same) x week, and itching. Pt has had abscesses drained previously. Abscess not draining. No fevers.

## 2022-07-17 NOTE — Discharge Instructions (Signed)
Avoid squeezing the area.  Continue warm water soaks.  Take the antibiotic as directed until finished.  Follow-up with your primary care provider for recheck return to emergency department for any new or worsening symptoms.

## 2022-07-21 ENCOUNTER — Other Ambulatory Visit: Payer: Medicaid Other

## 2022-07-24 ENCOUNTER — Ambulatory Visit
Admission: RE | Admit: 2022-07-24 | Discharge: 2022-07-24 | Disposition: A | Discharge status: Home / Self Care | Payer: Medicaid Other | Source: Ambulatory Visit | Attending: Orthopedic Surgery | Admitting: Orthopedic Surgery

## 2022-07-24 DIAGNOSIS — G8929 Other chronic pain: Secondary | ICD-10-CM

## 2022-07-24 DIAGNOSIS — Z9889 Other specified postprocedural states: Secondary | ICD-10-CM

## 2022-07-24 MED ORDER — IOPAMIDOL (ISOVUE-M 200) INJECTION 41%
15.0000 mL | Freq: Once | INTRAMUSCULAR | Status: AC
  Administered 2022-07-24: 15 mL via INTRA_ARTICULAR

## 2022-07-31 ENCOUNTER — Encounter: Payer: Self-pay | Admitting: Orthopedic Surgery

## 2022-07-31 ENCOUNTER — Ambulatory Visit (INDEPENDENT_AMBULATORY_CARE_PROVIDER_SITE_OTHER): Payer: Medicaid Other | Admitting: Orthopedic Surgery

## 2022-07-31 DIAGNOSIS — G8929 Other chronic pain: Secondary | ICD-10-CM

## 2022-07-31 DIAGNOSIS — Z9889 Other specified postprocedural states: Secondary | ICD-10-CM | POA: Diagnosis not present

## 2022-07-31 DIAGNOSIS — M25511 Pain in right shoulder: Secondary | ICD-10-CM

## 2022-07-31 NOTE — Progress Notes (Addendum)
Orthopaedic Postop Note  Assessment: Amber Drake is a 62 y.o. female s/p right shoulder arthroscopy, with rotator cuff repair and biceps tenotomy  DOS: 08/24/21  Plan: Amber Drake has occasional pain in the right shoulder.  She takes ibuprofen every other day.  She remains active.  She describes occasional sharp pains, which radiate distally.  On physical exam, she has good strength, as well as good range of motion.  We discussed the findings of the MRI, and she is not interested in an injection at this time, she states that is not hurting that much.  I encouraged her to continue working on the exercises recommended by physical therapy.  She states her understanding.  We could consider targeted injections, if her pain does not improve.  Other options include returning to formal PT, pain management or steroid injections.  Otherwise, she will let us know how she is doing, and if she needs to be reevaluated.  At this point, she is content to continue with medications as needed.  Activities as tolerated. She will follow-up as needed.    Follow-up: Return if symptoms worsen or fail to improve. XR at next visit: None  Subjective:  Chief Complaint  Patient presents with   Results    MRI R shoulder     History of Present Illness: Amber Drake is a 62 y.o. female who presents following the above stated procedure.  Surgery was almost 1 year ago.  She has completed physical therapy, and returns to work.  She states that she has occasional pains deep within her right shoulder.  Occasionally, she will have radiating pains distally.  She takes ibuprofen every couple of days.  No specific motion worsens her pain.  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  She has obtained an MRI with contrast, and is here to discuss the results.  Review of Systems: No fevers or chills No numbness or tingling No Chest Pain No shortness of  breath   Objective: LMP 07/18/2010   Physical Exam:  Alert and oriented.  No acute distress.  Surgical incisions have healed.  No surrounding erythema or drainage.  170 degrees of active forward flexion.  Internal rotation to T12.  Negative belly press.  5/5 infraspinatus strength.  Positive Jobe's.  5/5 supraspinatus strength.  Mild tenderness to palpation over the anterior shoulder.  Fingers are warm and well-perfused.  IMAGING: I personally ordered and reviewed the following images:  MRI right shoulder with contrast  IMPRESSION: 1. Prior rotator cuff repair. Mild tendinosis of the supraspinatus tendon with a small partial-thickness bursal surface tear anteriorly and a large partial-thickness articular surface with a small full-thickness component posteriorly. Moderate tendinosis of the infraspinatus tendon.  Oliver Barre, MD 07/31/2022 11:34 AM

## 2022-08-01 ENCOUNTER — Encounter: Payer: Self-pay | Admitting: Adult Health

## 2022-08-01 ENCOUNTER — Ambulatory Visit (INDEPENDENT_AMBULATORY_CARE_PROVIDER_SITE_OTHER): Payer: Medicaid Other | Admitting: Adult Health

## 2022-08-01 VITALS — BP 135/76 | HR 75 | Ht 66.0 in | Wt 168.5 lb

## 2022-08-01 DIAGNOSIS — L0292 Furuncle, unspecified: Secondary | ICD-10-CM | POA: Diagnosis not present

## 2022-08-01 DIAGNOSIS — L732 Hidradenitis suppurativa: Secondary | ICD-10-CM

## 2022-08-01 MED ORDER — SULFAMETHOXAZOLE-TRIMETHOPRIM 800-160 MG PO TABS: 1.0000 | ORAL_TABLET | Freq: Two times a day (BID) | ORAL | 1 refills | Status: DC

## 2022-08-01 MED ORDER — SILVER SULFADIAZINE 1 % EX CREA: 1.0000 | TOPICAL_CREAM | Freq: Every day | CUTANEOUS | 0 refills | Status: DC

## 2022-08-01 NOTE — Progress Notes (Signed)
  Subjective:     Patient ID: Amber Drake, female   DOB: [birth year], 62 y.o.   MRN: [id number]  HPI Amber Drake is a 62 year old black female, married, PM in complaining of boil right labia. Was seen on ER 07/17/22 and prescribed doxycycline, has not helped.  Last pap was negative HPV NILM 08/18/21  PCP is Dr Felecia Shelling   Review of Systems Has boil right labia Reviewed past medical,surgical, social and family history. Reviewed medications and allergies.     Objective:   Physical Exam BP 135/76 (BP Location: Left Arm, Patient Position: Sitting, Cuff Size: Normal)   Pulse 75   Ht 5\' 6"  (1.676 m)   Wt 168 lb 8 oz (76.4 kg)   LMP 07/18/2010   BMI 27.20 kg/m     Skin warm and dry.Pelvic: external genitalia has 2 cm firm boil under the skin right labia, and scarring in area from HS   Upstream - 08/01/22 1037       Pregnancy Intention Screening   Does the patient want to become pregnant in the next year? N/A    Does the patient's partner want to become pregnant in the next year? N/A    Would the patient like to discuss contraceptive options today? N/A      Contraception Wrap Up   Current Method Abstinence   PM   End Method Abstinence   PM   Contraception Counseling Provided No             Examination chaperoned by Malachy Mood LPN  Assessment:     1. Recurrent boils She was seen in ER 07/17/22 and prescribed doxycycline Has 2 cm boil under the skin right labia Has scarring in area from HS Will rx septra ds and silvadene cream Use warm compress, do not squeeze Meds ordered this encounter  Medications   sulfamethoxazole-trimethoprim (BACTRIM DS) 800-160 MG tablet    Sig: Take 1 tablet by mouth 2 (two) times daily. Take 1 bid    Dispense:  28 tablet    Refill:  1    Order Specific Question:   Supervising Provider    Answer:   Duane Lope H [[id number]]   silver sulfADIAZINE (SILVADENE) 1 % cream    Sig: Apply 1 Application topically daily.    Dispense:  50 g    Refill:  0     Order Specific Question:   Supervising Provider    Answer:   Despina Hidden, LUTHER H [[id number]]      2. Hidradenitis suppurativa     Plan:     Follow up in 08/14/22 for recheck

## 2022-08-14 ENCOUNTER — Encounter: Payer: Self-pay | Admitting: Adult Health

## 2022-08-14 ENCOUNTER — Ambulatory Visit (INDEPENDENT_AMBULATORY_CARE_PROVIDER_SITE_OTHER): Payer: Medicaid Other | Admitting: Adult Health

## 2022-08-14 VITALS — BP 116/65 | HR 75 | Ht 64.0 in | Wt 168.0 lb

## 2022-08-14 DIAGNOSIS — L732 Hidradenitis suppurativa: Secondary | ICD-10-CM | POA: Diagnosis not present

## 2022-08-14 DIAGNOSIS — L0292 Furuncle, unspecified: Secondary | ICD-10-CM

## 2022-08-14 NOTE — Progress Notes (Addendum)
  Subjective:     Patient ID: Amber Drake, female   DOB: February 01, 1961, 62 y.o.   MRN: [id number]  HPI Amber Drake is a 62 year old black female, married, PM back in follow up in boil right labia, feels better.  Last pap was negative HPV NILM 08/18/21   PCP is Dr Felecia Shelling   Review of Systems Boil right labia feels better Reviewed past medical,surgical, social and family history. Reviewed medications and allergies.     Objective:   Physical Exam BP 116/65 (BP Location: Left Arm, Patient Position: Sitting, Cuff Size: Normal)   Pulse 75   Ht 5\' 4"  (1.626 m)   Wt 168 lb (76.2 kg)   LMP 07/18/2010   BMI 28.84 kg/m      Skin warm and dry.Pelvic: external genitalia has 1.5 cm firm boil under the skin right labia, it is smaller and draining from 2 sites creamy fluid, when pressed, culture obtained, and scarring in area from HS   Upstream - 08/14/22 0934       Pregnancy Intention Screening   Does the patient want to become pregnant in the next year? N/A    Does the patient's partner want to become pregnant in the next year? N/A    Would the patient like to discuss contraceptive options today? N/A      Contraception Wrap Up   Current Method No Method - Other Reason   postmenopausal   Reason for No Current Contraceptive Method at Intake ([facility] Only) Other    End Method No Method - Other Reason   postmenopausal   Contraception Counseling Provided No            Examination chaperoned by Malachy Mood LPN  Assessment:     1. Recurrent boils Finish septra ds Use silvadene cream Use warm compress and gently massage Culture sent  2. Hidradenitis suppurativa     Plan:     Has appt 08/23/22 for physical, if persists, will get general surgery consult due to scarring in this area already

## 2022-08-14 NOTE — Addendum Note (Signed)
Addended by: Colen Darling on: 08/14/2022 10:52 AM   Modules accepted: Orders

## 2022-08-17 ENCOUNTER — Ambulatory Visit: Payer: Medicaid Other | Admitting: Orthopedic Surgery

## 2022-08-17 ENCOUNTER — Other Ambulatory Visit: Payer: Self-pay | Admitting: Adult Health

## 2022-08-17 LAB — WOUND CULTURE: Organism ID, Bacteria: NONE SEEN

## 2022-08-17 MED ORDER — CIPROFLOXACIN HCL 500 MG PO TABS: 500.0000 mg | ORAL_TABLET | Freq: Two times a day (BID) | ORAL | 0 refills | Status: DC

## 2022-08-17 NOTE — Progress Notes (Signed)
Rx: cipro.  

## 2022-08-23 ENCOUNTER — Ambulatory Visit (INDEPENDENT_AMBULATORY_CARE_PROVIDER_SITE_OTHER): Payer: Medicaid Other | Admitting: Adult Health

## 2022-08-23 ENCOUNTER — Other Ambulatory Visit (HOSPITAL_COMMUNITY)
Admission: RE | Admit: 2022-08-23 | Discharge: 2022-08-23 | Disposition: A | Discharge status: Home / Self Care | Payer: Medicaid Other | Source: Ambulatory Visit | Attending: Adult Health | Admitting: Adult Health

## 2022-08-23 ENCOUNTER — Encounter: Payer: Self-pay | Admitting: Adult Health

## 2022-08-23 VITALS — BP 99/53 | HR 71 | Ht 64.0 in | Wt 170.0 lb

## 2022-08-23 DIAGNOSIS — Z1211 Encounter for screening for malignant neoplasm of colon: Secondary | ICD-10-CM | POA: Diagnosis not present

## 2022-08-23 DIAGNOSIS — Z01419 Encounter for gynecological examination (general) (routine) without abnormal findings: Secondary | ICD-10-CM

## 2022-08-23 DIAGNOSIS — L732 Hidradenitis suppurativa: Secondary | ICD-10-CM

## 2022-08-23 LAB — HEMOCCULT GUIAC POC 1CARD (OFFICE): Fecal Occult Blood, POC: NEGATIVE

## 2022-08-23 NOTE — Addendum Note (Signed)
Addended by: Moss Mc on: 08/23/2022 01:30 PM   Modules accepted: Orders

## 2022-08-23 NOTE — Progress Notes (Signed)
Patient ID: Amber Drake, female   DOB: [birth year], 62 y.o.   MRN: [id number] History of Present Illness: Amber Drake is a 62 year old black female,married, PM in for a well woman gyn exam and pap. Feeling better.   PCP is Dr Felecia Shelling.   Current Medications, Allergies, Past Medical History, Past Surgical History, Family History and Social History were reviewed in [employer] record.     Review of Systems: Patient denies any headaches, hearing loss, fatigue, blurred vision, shortness of breath, chest pain, abdominal pain, problems with bowel movements, urination, or intercourse(no sex). No joint pain or mood swings.     Physical Exam:BP (!) 99/53 (BP Location: Left Arm, Patient Position: Sitting, Cuff Size: Normal)   Pulse 71   Ht 5\' 4"  (1.626 m)   Wt 170 lb (77.1 kg)   LMP 07/18/2010   BMI 29.18 kg/m   General:  Well developed, well nourished, no acute distress Skin:  Warm and dry Neck:  Midline trachea, normal thyroid, good ROM, no lymphadenopathy, no carotid bruits heard Lungs; Clear to auscultation bilaterally Breast:  No dominant palpable mass, retraction, or nipple discharge Cardiovascular: Regular rate and rhythm Abdomen:  Soft, non tender, no hepatosplenomegaly Pelvic:  External genitalia is normal in appearance, scarring from HS. Boil has resolved.  The vagina is pale. Urethra has no lesions or masses. The cervix is smooth, pap with HR HPV genotyping performed.  Uterus is felt to be normal size, shape, and contour.  No adnexal masses or tenderness noted.Bladder is non tender, no masses felt. Rectal: Good sphincter tone, no polyps, or hemorrhoids felt.  Hemoccult negative. Extremities/musculoskeletal:  No swelling or varicosities noted, no clubbing or cyanosis Psych:  No mood changes, alert and cooperative,seems happy AA is 0 Fall risk is low    08/23/2022   11:36 AM 08/18/2021   10:19 AM 08/16/2020   10:29 AM  Depression screen PHQ 2/9  Decreased Interest  0 0 0  Down, Depressed, Hopeless 0 0 0  PHQ - 2 Score 0 0 0  Altered sleeping 0 0 0  Tired, decreased energy 0 0 0  Change in appetite 0 0 0  Feeling bad or failure about yourself  0 0 0  Trouble concentrating 0 0 0  Moving slowly or fidgety/restless 0 0 0  Suicidal thoughts 0 0 0  PHQ-9 Score 0 0 0  Difficult doing work/chores Not difficult at all         08/23/2022   11:36 AM 08/18/2021   10:19 AM 08/16/2020   10:29 AM 03/02/2015   11:46 AM  GAD 7 : Generalized Anxiety Score  Nervous, Anxious, on Edge 0 0 0   Control/stop worrying 0 0 0 3  Worry too much - different things 0 2 0 3  Trouble relaxing 0 0 0 2  Restless 0 0 0 2  Easily annoyed or irritable 0 1 0 3  Afraid - awful might happen 0 0 0 3  Total GAD 7 Score 0 3 0   Anxiety Difficulty Not difficult at all  Not difficult at all Somewhat difficult      Upstream - 08/23/22 1129       Pregnancy Intention Screening   Does the patient want to become pregnant in the next year? N/A    Does the patient's partner want to become pregnant in the next year? N/A    Would the patient like to discuss contraceptive options today? N/A      Contraception  Wrap Up   Current Method Abstinence    Reason for No Current Contraceptive Method at Intake ([facility] Only) Abstinence    End Method Abstinence    Contraception Counseling Provided No            Examination chaperoned by Tish RN   Impression and Plan:  1. Encounter for gynecological examination with Papanicolaou smear of cervix Pap sent Pap in 3 years if normal Physical in 1 year Labs with PCP Get mammogram ASAP   2. Encounter for screening fecal occult blood testing Hemoccult was negative  - POCT occult blood stool  3. Hidradenitis suppurativa Stable now, no boils

## 2022-09-03 ENCOUNTER — Ambulatory Visit (HOSPITAL_COMMUNITY)
Admission: RE | Admit: 2022-09-03 | Discharge: 2022-09-03 | Disposition: A | Discharge status: Home / Self Care | Payer: Medicaid Other | Source: Ambulatory Visit | Attending: Adult Health | Admitting: Adult Health

## 2022-09-03 DIAGNOSIS — Z1231 Encounter for screening mammogram for malignant neoplasm of breast: Secondary | ICD-10-CM | POA: Insufficient documentation

## 2022-09-03 LAB — CYTOLOGY - PAP
Adequacy: ABSENT
Comment: NEGATIVE
Diagnosis: NEGATIVE
High risk HPV: NEGATIVE

## 2022-09-11 ENCOUNTER — Telehealth: Payer: Self-pay

## 2022-09-11 NOTE — Telephone Encounter (Signed)
Patient called and stated that she would like for [facility] to call her in an antibiotic.

## 2022-09-11 NOTE — Telephone Encounter (Signed)
Left message @ 4:53 pm. JSY 

## 2022-09-12 MED ORDER — SULFAMETHOXAZOLE-TRIMETHOPRIM 800-160 MG PO TABS: 1.0000 | ORAL_TABLET | Freq: Two times a day (BID) | ORAL | 0 refills | Status: DC

## 2022-09-12 NOTE — Telephone Encounter (Signed)
Pt states boil never went down and pt is requesting more antibiotics. Thanks! JSY

## 2022-09-12 NOTE — Telephone Encounter (Signed)
Left message that rx sent in for septra ds, use silvadene and if not getting better, call for appt

## 2022-09-18 ENCOUNTER — Other Ambulatory Visit: Payer: Self-pay

## 2022-09-18 DIAGNOSIS — C884 Extranodal marginal zone B-cell lymphoma of mucosa-associated lymphoid tissue [MALT-lymphoma]: Secondary | ICD-10-CM

## 2022-09-19 ENCOUNTER — Inpatient Hospital Stay: Payer: Medicaid Other | Attending: Hematology

## 2022-09-19 ENCOUNTER — Other Ambulatory Visit: Payer: Self-pay

## 2022-09-19 ENCOUNTER — Inpatient Hospital Stay (HOSPITAL_BASED_OUTPATIENT_CLINIC_OR_DEPARTMENT_OTHER): Payer: Medicaid Other | Admitting: Hematology

## 2022-09-19 VITALS — BP 119/95 | HR 73 | Temp 98.2°F | Resp 18 | Wt 166.0 lb

## 2022-09-19 DIAGNOSIS — C884 Extranodal marginal zone B-cell lymphoma of mucosa-associated lymphoid tissue [MALT-lymphoma]: Secondary | ICD-10-CM | POA: Insufficient documentation

## 2022-09-19 LAB — CBC WITH DIFFERENTIAL (CANCER CENTER ONLY)
Abs Immature Granulocytes: 0.01 10*3/uL (ref 0.00–0.07)
Basophils Absolute: 0 10*3/uL (ref 0.0–0.1)
Basophils Relative: 0 %
Eosinophils Absolute: 0.1 10*3/uL (ref 0.0–0.5)
Eosinophils Relative: 1 %
HCT: 36.9 % (ref 36.0–46.0)
Hemoglobin: 12.5 g/dL (ref 12.0–15.0)
Immature Granulocytes: 0 %
Lymphocytes Relative: 36 %
Lymphs Abs: 2.4 10*3/uL (ref 0.7–4.0)
MCH: 28.7 pg (ref 26.0–34.0)
MCHC: 33.9 g/dL (ref 30.0–36.0)
MCV: 84.8 fL (ref 80.0–100.0)
Monocytes Absolute: 0.5 10*3/uL (ref 0.1–1.0)
Monocytes Relative: 8 %
Neutro Abs: 3.6 10*3/uL (ref 1.7–7.7)
Neutrophils Relative %: 55 %
Platelet Count: 392 10*3/uL (ref 150–400)
RBC: 4.35 MIL/uL (ref 3.87–5.11)
RDW: 12.8 % (ref 11.5–15.5)
WBC Count: 6.6 10*3/uL (ref 4.0–10.5)
nRBC: 0 % (ref 0.0–0.2)

## 2022-09-19 LAB — LACTATE DEHYDROGENASE: LDH: 138 U/L (ref 98–192)

## 2022-09-19 LAB — CMP (CANCER CENTER ONLY)
ALT: 14 U/L (ref 0–44)
AST: 19 U/L (ref 15–41)
Albumin: 4.1 g/dL (ref 3.5–5.0)
Alkaline Phosphatase: 72 U/L (ref 38–126)
Anion gap: 4 — ABNORMAL LOW (ref 5–15)
BUN: 13 mg/dL (ref 8–23)
CO2: 29 mmol/L (ref 22–32)
Calcium: 10.2 mg/dL (ref 8.9–10.3)
Chloride: 105 mmol/L (ref 98–111)
Creatinine: 0.96 mg/dL (ref 0.44–1.00)
GFR, Estimated: >60 mL/min (ref >60)
Glucose, Bld: 80 mg/dL (ref 70–99)
Potassium: 4.5 mmol/L (ref 3.5–5.1)
Sodium: 138 mmol/L (ref 135–145)
Total Bilirubin: 0.4 mg/dL (ref 0.3–1.2)
Total Protein: 6.9 g/dL (ref 6.5–8.1)

## 2022-09-19 NOTE — Progress Notes (Signed)
[facility] OFFICE PROGRESS NOTE DOS 09/19/22   Benetta Spar, MD [address] 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  CURRENT THERAPY: maintenance Rituxan q8weeks  INTERVAL HISTORY:  Amber Drake is here for continued evaluation and management of extranodal marginal zone lymphoma.  Patient was last seen by me on 04/04/2022 and reported that she was continuing to recover from her rotator cuff surgery performed on 08/24/2021. She complained of lower back pain, but was otherwise doing well overall.   Today, she complains of skin boils in her buttocks. She was on antibiotics and resolved. She reports having issues with lifting her right upper extremity and has received rotator cuff repair. She has a lumbar MRI scheduled for July 2024 due to mild lower back pain.  She reports having an MRI of the head in March 2024 due to frequent dizziness. Results showed normal findings with no signs of lymphoma.Her dizziness resolved by Meclizine.   She denies any new headaches, new neck stiffness, injuries falls, new back pain, fever, chills, night sweats, or unexpected weight loss. She reports stable energy levels and normal p.o. intake. No other infection issues.  MEDICAL HISTORY: Past Medical History:  Diagnosis Date   Anemia    Arthritis    Blood dyscrasia    sickle cell trait   Cancer (HCC)    lymphoma   Carbuncle and furuncle    Hypercholesterolemia    Hypertension    does not take meds   Papanicolaou smear of cervix with positive high risk human papilloma virus (HPV) test 08/23/2020   08/23/20    Colpo per ASCCP guidelines, immediate risk of CIN 3+ is 4.1 %   Sickle cell trait (HCC)     ALLERGIES:  is allergic to penicillins, penicillins cross reactors, and tape.  MEDICATIONS:  Current Outpatient Medications  Medication Sig Dispense Refill    atorvastatin (LIPITOR) 20 MG tablet Take 1 tablet (20 mg total) by mouth daily. 90 tablet 2   ciprofloxacin (CIPRO) 500 MG tablet Take 1 tablet (500 mg total) by mouth 2 (two) times daily. 10 tablet 0   gabapentin (NEURONTIN) 300 MG capsule Take 300 mg by mouth daily. (Patient not taking: Reported on 08/23/2022)     lisinopril-hydrochlorothiazide (PRINZIDE,ZESTORETIC) 20-12.5 MG tablet Take 1 tablet by mouth daily.     meclizine (ANTIVERT) 25 MG tablet Take 1 tablet (25 mg total) by mouth 3 (three) times daily as needed for dizziness. (Patient not taking: Reported on 08/23/2022) 30 tablet 0   Multiple Vitamin (MULTIVITAMIN WITH MINERALS) TABS tablet Take 1 tablet by mouth daily.     ondansetron (ZOFRAN) 4 MG tablet Take 1 tablet (4 mg total) by mouth every 6 (six) hours. (Patient not taking: Reported on 08/23/2022) 15 tablet 0   oxyCODONE (OXY IR/ROXICODONE) 5 MG immediate release tablet Take 5 mg by mouth every 6 (six) hours as needed. (Patient not taking: Reported on 08/23/2022)     potassium chloride SA (KLOR-CON) 20 MEQ tablet TAKE (1) TABLET BY MOUTH TWICE DAILY. 60 tablet 1   silver sulfADIAZINE (SILVADENE) 1 % cream Apply 1 Application topically daily. 50 g 0   sulfamethoxazole-trimethoprim (BACTRIM DS) 800-160 MG tablet Take 1 tablet by mouth 2 (two) times daily. Take 1 bid 28 tablet 0   No current facility-administered medications for this visit.   Facility-Administered Medications Ordered in Other Visits  Medication Dose Route Frequency Provider Last  Rate Last Admin   acetaminophen (TYLENOL) tablet 500 mg  500 mg Oral Once Johney Maine, MD        SURGICAL HISTORY:  Past Surgical History:  Procedure Laterality Date   APPENDECTOMY     APPLICATION OF CRANIAL NAVIGATION N/A 07/28/2018   Procedure: APPLICATION OF CRANIAL NAVIGATION;  Surgeon: Donalee Citrin, MD;  Location: [facility] OR;  Service: Neurosurgery;  Laterality: N/A;   ARTHOSCOPIC ROTAOR CUFF REPAIR Right 08/24/2021   Procedure:  ARTHROSCOPIC ROTATOR CUFF REPAIR;  Surgeon: Oliver Barre, MD;  Location: [facility] ORS;  Service: Orthopedics;  Laterality: Right;   BRAIN SURGERY  2020   CARPAL TUNNEL RELEASE  03/23/2011   Procedure: CARPAL TUNNEL RELEASE;  Surgeon: Darreld Mclean;  Location: [facility] ORS;  Service: Orthopedics;  Laterality: Left;   CARPAL TUNNEL RELEASE  05/10/2011   Procedure: CARPAL TUNNEL RELEASE;  Surgeon: Darreld Mclean, MD;  Location: [facility] ORS;  Service: Orthopedics;  Laterality: Right;   CESAREAN SECTION     x 2   COLONOSCOPY WITH PROPOFOL N/A 11/16/2020   Procedure: COLONOSCOPY WITH PROPOFOL;  Surgeon: Dolores Frame, MD;  Location: [facility];  Service: Gastroenterology;  Laterality: N/A;  9:15   INCISION AND DRAINAGE PERIRECTAL ABSCESS  12/21/2009   LUMBAR LAMINECTOMY/DECOMPRESSION MICRODISCECTOMY Left 12/12/2020   Procedure: Laminectomy and Foraminotomy - left - L5-S1;  Surgeon: Donalee Citrin, MD;  Location: [facility] OR;  Service: Neurosurgery;  Laterality: Left;  3C   PR DURAL GRAFT SPINAL N/A 07/28/2018   Procedure: Stereotactic open biopsy of Right cerebellar hemisphere and dura with brainlab;  Surgeon: Donalee Citrin, MD;  Location: [facility] OR;  Service: Neurosurgery;  Laterality: N/A;  Stereotactic open biopsy of Right cerebellar hemisphere and dura with brainlab   PROCTOSCOPY  10/17/2010   Procedure: PROCTOSCOPY;  Surgeon: Marlane Hatcher;  Location: [facility];  Service: General;  Laterality: N/A;  Rigid Proctoscopy/Possible Fistula in Ano  Procedure ended at 1003   SHOULDER ARTHROSCOPY WITH BICEPSTENOTOMY Right 08/24/2021   Procedure: SHOULDER ARTHROSCOPY WITH BICEPSTENOTOMY;  Surgeon: Oliver Barre, MD;  Location: [facility];  Service: Orthopedics;  Laterality: Right;   THERAPEUTIC ABORTION     x2    REVIEW OF SYSTEMS:    10 Point review of Systems was done is negative except as noted above.    PHYSICAL EXAMINATION:  Blood pressure (!) 119/95, pulse 73, temperature 98.2 F (36.8 C), resp. rate 18,  weight 166 lb (75.3 kg), last menstrual period 07/18/2010, SpO2 100 %. GENERAL:alert, in no acute distress and comfortable SKIN: no acute rashes, no significant lesions EYES: conjunctiva are pink and non-injected, sclera anicteric OROPHARYNX: MMM, no exudates, no oropharyngeal erythema or ulceration NECK: supple, no JVD LYMPH:  no palpable lymphadenopathy in the cervical, axillary or inguinal regions LUNGS: clear to auscultation b/l with normal respiratory effort HEART: regular rate & rhythm ABDOMEN:  normoactive bowel sounds , non tender, not distended. Extremity: no pedal edema PSYCH: alert & oriented x 3 with fluent speech NEURO: no focal motor/sensory deficits    LABORATORY DATA:     Latest Ref Rng & Units 09/19/2022    9:37 AM 06/20/2022    1:03 PM 04/04/2022   10:31 AM  CBC  WBC 4.0 - 10.5 K/uL 6.6  7.4  6.1   Hemoglobin 12.0 - 15.0 g/dL 16.1  09.6  04.5   Hematocrit 36.0 - 46.0 % 36.9  39.0  37.2   Platelets 150 - 400 K/uL 392  277  377     .  Latest Ref Rng & Units 09/19/2022    9:37 AM 06/20/2022    1:03 PM 04/04/2022   10:31 AM  CMP  Glucose 70 - 99 mg/dL 80  161  91   BUN 8 - 23 mg/dL 13  10  11    Creatinine 0.44 - 1.00 mg/dL 0.96  0.45  4.09   Sodium 135 - 145 mmol/L 138  135  140   Potassium 3.5 - 5.1 mmol/L 4.5  3.6  3.4   Chloride 98 - 111 mmol/L 105  101  102   CO2 22 - 32 mmol/L 29  25  34   Calcium 8.9 - 10.3 mg/dL 81.1  9.7  91.4   Total Protein 6.5 - 8.1 g/dL 6.9   6.7   Total Bilirubin 0.3 - 1.2 mg/dL 0.4   0.3   Alkaline Phos 38 - 126 U/L 72   76   AST 15 - 41 U/L 19   18   ALT 0 - 44 U/L 14   15     . Lab Results  Component Value Date   LDH 138 09/19/2022    RADIOGRAPHIC STUDIES:  MM 3D SCREEN BREAST BILATERAL  Result Date: 09/04/2022 CLINICAL DATA:  Screening. EXAM: DIGITAL SCREENING BILATERAL MAMMOGRAM WITH TOMOSYNTHESIS AND CAD TECHNIQUE: Bilateral screening digital craniocaudal and mediolateral oblique mammograms were obtained.  Bilateral screening digital breast tomosynthesis was performed. The images were evaluated with computer-aided detection. COMPARISON:  Previous exam(s). ACR Breast Density Category b: There are scattered areas of fibroglandular density. FINDINGS: There are no findings suspicious for malignancy. IMPRESSION: No mammographic evidence of malignancy. A result letter of this screening mammogram will be mailed directly to the patient. RECOMMENDATION: Screening mammogram in one year. (Code:SM-B-01Y) BI-RADS CATEGORY  1: Negative. Electronically Signed   By: Norva Pavlov M.D.   On: 09/04/2022 12:35    MRI Brain 06/20/2022: IMPRESSION: 1. No evidence of acute intracranial abnormality or metastatic disease. 2. 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     Electronically Signed   By: Feliberto Harts M.D.   On: 06/20/2022 15:16   ASSESSMENT/PLAN:   62 y.o. female with   1. 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 The process appears to extend into the upper cervical canal. Mild obstructive hydrocephalus. Differential includes tumor including metastatic disease and lymphoma. Chronic inflammatory process such as sarcoid is a consideration however chest x-ray negative. TB and atypical infection/fungus also possible. 6 mm colloid cyst felt to be a separate problem."   07/25/18 CT C/A/P which did not reveal significant abnormality   09/12/18 BM Bx revealed no evidence of lymphoma   09/08/18 PET/CT revealed "No hypermetabolic mass or  adenopathy identified within the chest abdomen or pelvis to suggest metabolically active tumor. 2. Nonspecific focus of increased uptake within the cord extending from T12 to L1. Cannot rule out additional site of CNS lymphoma. Consider further evaluation with contrast enhanced MRI through this area. 3. Aortic Atherosclerosis and Emphysema."   10/16/2018 MRI brain w and w/o contrast revealed "1. Resolved dural mass in the right posterior fossa with minimal smooth  dural thickening that may be treatment related. Cerebellar edema and mass effect is also resolved. No new site of disease. 2. 6 mm colloid cyst."   10/20/2018 MRI cervical spine w and w/o contrast revealed "Negative for lymphoma.  No acute abnormality. Central disc protrusion at C5-6 effaces the ventral thecal sac causing mild central canal narrowing. There is also a shallow disc bulge at C6-7 which narrows but does not efface the ventral thecal Sac."   01/13/2019 MRI Brain ([id number]) revealed "1. Stable and satisfactory post treatment appearance of the posterior fossa. 2. Stable small 6 mm colloid cyst. 3. No new intracranial abnormality."   05/06/2019 MRI Cervical Spine ([id number]) revealed "No evidence of lymphoma in the cervical spine. Previously noted dural enhancement in the posterior fossa and cervical canal has resolved. No cord compression or cord lesion. Chronic cervical spine degenerative changes are stable from the prior study."   12/15/2019 MRI Brain ([id number]) revealed "No acute intracranial abnormality. No enhancing mass lesion 5 mm colloid cyst in the third ventricle is chronic and unchanged from prior studies."  08/14/2020, MR Brain ([id number]) which revealed "1. Unchanged appearance of the brain. No evidence of recurrent intracranial lymphoma. 2. Small colloid cyst without hydrocephalus."   06/22/2021 MRI brain- Stable MRI. No recurrent mass in the right posterior fossa at the site of prior craniotomy.    PLAN: -Discussed lab results on 09/19/2022 in detail with patient. CBC normal, showed WBC of 6.6K, hemoglobin of 12.5, and platelets of 392K. -CMP WNL -LDH WNL -will continue to monitor with labs in 6 months -Patient has no lab or clinical findings suggestive of progression of her marginal zone lymphoma. -Discussed the recent MRI brain on 06/2022 results which did not show any radiographic evidence of MZL recurrence at this time.  FOLLOW UP: RTC with Dr Candise Che with labs in 24 weeks  The total time spent in the appointment was 20 minutes* .  All of the patient's questions were answered with apparent satisfaction. The patient knows to call the clinic with any problems, questions or concerns.   Wyvonnia Lora MD MS AAHIVMS [facility] Hematology/Oncology Physician [facility]  .*Total Encounter Time as defined by the Centers for Medicare and Medicaid Services includes, in addition to the face-to-face time of a patient visit (documented in the note above) non-face-to-face time: obtaining and reviewing outside history, ordering and reviewing medications, tests or procedures, care coordination (communications with other health care professionals or caregivers) and documentation in the medical record.    I,Mitra Faeizi,acting as a Neurosurgeon for Wyvonnia Lora, MD.,have documented all relevant documentation on the behalf of Wyvonnia Lora, MD,as directed by  Wyvonnia Lora, MD while in the presence of Wyvonnia Lora, MD.  .I have reviewed the above documentation for accuracy and completeness, and I agree with the above. Johney Maine MD

## 2022-10-08 ENCOUNTER — Encounter: Payer: Self-pay | Admitting: Neurosurgery

## 2022-10-09 ENCOUNTER — Ambulatory Visit
Admission: RE | Admit: 2022-10-09 | Discharge: 2022-10-09 | Disposition: A | Discharge status: Home / Self Care | Payer: Medicaid Other | Source: Ambulatory Visit | Attending: Neurosurgery | Admitting: Neurosurgery

## 2022-10-09 DIAGNOSIS — M48062 Spinal stenosis, lumbar region with neurogenic claudication: Secondary | ICD-10-CM

## 2022-10-09 MED ORDER — GADOPICLENOL 0.5 MMOL/ML IV SOLN
8.0000 mL | Freq: Once | INTRAVENOUS | Status: AC | PRN
  Administered 2022-10-09: 8 mL via INTRAVENOUS

## 2022-10-23 ENCOUNTER — Emergency Department (HOSPITAL_COMMUNITY): Payer: Medicaid Other

## 2022-10-23 ENCOUNTER — Other Ambulatory Visit: Payer: Self-pay

## 2022-10-23 ENCOUNTER — Encounter (HOSPITAL_COMMUNITY): Payer: Self-pay | Admitting: Emergency Medicine

## 2022-10-23 ENCOUNTER — Emergency Department (HOSPITAL_COMMUNITY)
Admission: EM | Admit: 2022-10-23 | Discharge: 2022-10-23 | Disposition: A | Discharge status: Home / Self Care | Payer: Medicaid Other | Attending: Emergency Medicine | Admitting: Emergency Medicine

## 2022-10-23 DIAGNOSIS — Z85828 Personal history of other malignant neoplasm of skin: Secondary | ICD-10-CM | POA: Insufficient documentation

## 2022-10-23 DIAGNOSIS — I10 Essential (primary) hypertension: Secondary | ICD-10-CM | POA: Diagnosis not present

## 2022-10-23 DIAGNOSIS — Z79899 Other long term (current) drug therapy: Secondary | ICD-10-CM | POA: Insufficient documentation

## 2022-10-23 DIAGNOSIS — R0789 Other chest pain: Secondary | ICD-10-CM | POA: Insufficient documentation

## 2022-10-23 LAB — CBC
HCT: 33.2 % — ABNORMAL LOW (ref 36.0–46.0)
Hemoglobin: 11.1 g/dL — ABNORMAL LOW (ref 12.0–15.0)
MCH: 28.5 pg (ref 26.0–34.0)
MCHC: 33.4 g/dL (ref 30.0–36.0)
MCV: 85.1 fL (ref 80.0–100.0)
Platelets: 388 10*3/uL (ref 150–400)
RBC: 3.9 MIL/uL (ref 3.87–5.11)
RDW: 13.1 % (ref 11.5–15.5)
WBC: 6.4 10*3/uL (ref 4.0–10.5)
nRBC: 0 % (ref 0.0–0.2)

## 2022-10-23 LAB — BASIC METABOLIC PANEL
Anion gap: 10 (ref 5–15)
BUN: 11 mg/dL (ref 8–23)
CO2: 24 mmol/L (ref 22–32)
Calcium: 9.3 mg/dL (ref 8.9–10.3)
Chloride: 103 mmol/L (ref 98–111)
Creatinine, Ser: 0.83 mg/dL (ref 0.44–1.00)
GFR, Estimated: >60 mL/min (ref >60)
Glucose, Bld: 91 mg/dL (ref 70–99)
Potassium: 3.5 mmol/L (ref 3.5–5.1)
Sodium: 137 mmol/L (ref 135–145)

## 2022-10-23 LAB — TROPONIN I (HIGH SENSITIVITY): Troponin I (High Sensitivity): 3 ng/L (ref <18)

## 2022-10-23 MED ORDER — METHOCARBAMOL 500 MG PO TABS
500.0000 mg | ORAL_TABLET | Freq: Once | ORAL | Status: AC
  Administered 2022-10-23: 500 mg via ORAL
  Filled 2022-10-23: qty 1

## 2022-10-23 MED ORDER — METHOCARBAMOL 500 MG PO TABS: 500.0000 mg | ORAL_TABLET | Freq: Three times a day (TID) | ORAL | 0 refills | Status: DC | PRN

## 2022-10-23 NOTE — ED Triage Notes (Signed)
Pt c/o left sided chest pain for the past 2 days. Pt states the pain started after she was lifting heavy things.

## 2022-10-23 NOTE — ED Provider Notes (Signed)
EMERGENCY DEPARTMENT AT [facility] Provider Note   CSN: [id number] Arrival date & time: 10/23/22  7829     History  Chief Complaint  Patient presents with   Chest Pain    Amber Drake is a 62 y.o. female.   Chest Pain Patient presents with left upper chest pain.  Has had for the last 2 days.  Began after moving some heavy things.  Left upper chest.  Worse with pressing on it.  Worse in certain movements.  Not worse with breathing.  No fevers or chills.  No swelling in her legs.  No cardiac history.    Past Medical History:  Diagnosis Date   Anemia    Arthritis    Blood dyscrasia    sickle cell trait   Cancer (HCC)    lymphoma   Carbuncle and furuncle    Hypercholesterolemia    Hypertension    does not take meds   Papanicolaou smear of cervix with positive high risk human papilloma virus (HPV) test 08/23/2020   08/23/20    Colpo per ASCCP guidelines, immediate risk of CIN 3+ is 4.1 %   Sickle cell trait (HCC)     Home Medications Prior to Admission medications   Medication Sig Start Date End Date Taking? Authorizing Provider  methocarbamol (ROBAXIN) 500 MG tablet Take 1 tablet (500 mg total) by mouth every 8 (eight) hours as needed for muscle spasms. 10/23/22  Yes Benjiman Core, MD  atorvastatin (LIPITOR) 20 MG tablet Take 1 tablet (20 mg total) by mouth daily. 03/14/15   Jacquelin Hawking, PA-C  ciprofloxacin (CIPRO) 500 MG tablet Take 1 tablet (500 mg total) by mouth 2 (two) times daily. 08/17/22   Adline Potter, NP  gabapentin (NEURONTIN) 300 MG capsule Take 300 mg by mouth daily. Patient not taking: Reported on 08/23/2022 08/15/22   [provider]  lisinopril-hydrochlorothiazide (PRINZIDE,ZESTORETIC) 20-12.5 MG tablet Take 1 tablet by mouth daily.    [provider]  meclizine (ANTIVERT) 25 MG tablet Take 1 tablet (25 mg total) by mouth 3 (three) times daily as needed for dizziness. Patient not taking: Reported on  08/23/2022 06/20/22   Prosperi, Christian H, PA-C  Multiple Vitamin (MULTIVITAMIN WITH MINERALS) TABS tablet Take 1 tablet by mouth daily.    [provider]  ondansetron (ZOFRAN) 4 MG tablet Take 1 tablet (4 mg total) by mouth every 6 (six) hours. Patient not taking: Reported on 08/23/2022 07/17/22   Triplett, Tammy, PA-C  oxyCODONE (OXY IR/ROXICODONE) 5 MG immediate release tablet Take 5 mg by mouth every 6 (six) hours as needed. Patient not taking: Reported on 08/23/2022 10/02/21   [provider]  potassium chloride SA (KLOR-CON) 20 MEQ tablet TAKE (1) TABLET BY MOUTH TWICE DAILY. 01/31/21   Johney Maine, MD  silver sulfADIAZINE (SILVADENE) 1 % cream Apply 1 Application topically daily. 08/01/22   Adline Potter, NP  sulfamethoxazole-trimethoprim (BACTRIM DS) 800-160 MG tablet Take 1 tablet by mouth 2 (two) times daily. Take 1 bid 09/12/22   Adline Potter, NP      Allergies    Penicillins, Penicillins cross reactors, and Tape    Review of Systems   Review of Systems  Cardiovascular:  Positive for chest pain.    Physical Exam Updated Vital Signs BP 139/68 (BP Location: Right Arm)   Pulse 70   Temp 98.3 F (36.8 C) (Oral)   Resp 18   Ht 5\' 4"  (1.626 m)   Wt 76.2  kg   LMP 07/18/2010   SpO2 99%   BMI 28.84 kg/m  Physical Exam Vitals and nursing note reviewed.  Cardiovascular:     Rate and Rhythm: Regular rhythm.  Pulmonary:     Breath sounds: No wheezing.  Chest:     Chest wall: Tenderness present.     Comments:  tender to left upper anterior chest wall.  No rash.  No mass.  No subcu emphysema. Neurological:     Mental Status: She is alert.     ED Results / Procedures / Treatments   Labs (all labs ordered are listed, but only abnormal results are displayed) Labs Reviewed  CBC - Abnormal; Notable for the following components:      Result Value   Hemoglobin 11.1 (*)    HCT 33.2 (*)    All other components within normal limits  BASIC  METABOLIC PANEL  TROPONIN I (HIGH SENSITIVITY)    EKG EKG Interpretation Date/Time:  Tuesday October 23 2022 19:20:19 EDT Ventricular Rate:  65 PR Interval:  184 QRS Duration:  78 QT Interval:  386 QTC Calculation: 401 R Axis:   39  Text Interpretation: Normal sinus rhythm Septal infarct , age undetermined Abnormal ECG When compared with ECG of 20-Jun-2022 12:55, No significant change since last tracing Confirmed by Benjiman Core [phone]) on 10/23/2022 8:00:07 PM  Radiology DG Chest 2 View  Result Date: 10/23/2022 CLINICAL DATA:  Left-sided chest pain for 2 days. EXAM: CHEST - 2 VIEW COMPARISON:  Chest radiographs 07/26/2021 FINDINGS: Cardiac silhouette and mediastinal contours are within normal limits. The lungs are clear. No pleural effusion or pneumothorax. Mild multilevel degenerative disc changes of the thoracic spine. IMPRESSION: No active cardiopulmonary disease. Electronically Signed   By: Neita Garnet M.D.   On: 10/23/2022 20:06    Procedures Procedures    Medications Ordered in ED Medications  methocarbamol (ROBAXIN) tablet 500 mg (500 mg Oral Given 10/23/22 2144)    ED Course/ Medical Decision Making/ A&P                             Medical Decision Making Amount and/or Complexity of Data Reviewed Labs: ordered. Radiology: ordered.  Risk Prescription drug management.   Patient with left upper chest pain.  Most likely musculoskeletal.  Reproducible began after physical activity.  Does not have pain with physical activity specifically.  Doubt pulm embolism.  X-ray reassuring.  EKG reassuring.  Doubt cardiac ischemia.  Appears stable for discharge home with muscle relaxer.        Final Clinical Impression(s) / ED Diagnoses Final diagnoses:  Chest wall pain    Rx / DC Orders ED Discharge Orders          Ordered    methocarbamol (ROBAXIN) 500 MG tablet  Every 8 hours PRN        10/23/22 2132              Benjiman Core, MD 10/23/22  2344

## 2022-11-07 ENCOUNTER — Other Ambulatory Visit: Payer: Self-pay | Admitting: Adult Health

## 2022-11-07 MED ORDER — SULFAMETHOXAZOLE-TRIMETHOPRIM 800-160 MG PO TABS: 1.0000 | ORAL_TABLET | Freq: Two times a day (BID) | ORAL | 0 refills | Status: DC

## 2022-11-07 NOTE — Telephone Encounter (Signed)
Refilled septra ds

## 2022-12-10 ENCOUNTER — Other Ambulatory Visit: Payer: Self-pay

## 2022-12-10 ENCOUNTER — Emergency Department (HOSPITAL_COMMUNITY)
Admission: EM | Admit: 2022-12-10 | Discharge: 2022-12-10 | Disposition: A | Discharge status: Home / Self Care | Payer: Medicaid Other | Attending: Emergency Medicine | Admitting: Emergency Medicine

## 2022-12-10 ENCOUNTER — Encounter (HOSPITAL_COMMUNITY): Payer: Self-pay | Admitting: Emergency Medicine

## 2022-12-10 DIAGNOSIS — I10 Essential (primary) hypertension: Secondary | ICD-10-CM | POA: Insufficient documentation

## 2022-12-10 DIAGNOSIS — E119 Type 2 diabetes mellitus without complications: Secondary | ICD-10-CM | POA: Diagnosis not present

## 2022-12-10 DIAGNOSIS — L0291 Cutaneous abscess, unspecified: Secondary | ICD-10-CM | POA: Insufficient documentation

## 2022-12-10 DIAGNOSIS — Z79899 Other long term (current) drug therapy: Secondary | ICD-10-CM | POA: Diagnosis not present

## 2022-12-10 MED ORDER — CIPROFLOXACIN HCL 500 MG PO TABS: 500.0000 mg | ORAL_TABLET | Freq: Two times a day (BID) | ORAL | 0 refills | Status: DC

## 2022-12-10 NOTE — ED Provider Notes (Signed)
[place] EMERGENCY DEPARTMENT AT [facility] Provider Note   CSN: [id number] Arrival date & time: 12/10/22  1033     History  Chief Complaint  Patient presents with   Abscess    Amber Drake is a 62 y.o. female w/ pmhx HS and recurrent boils, DM, hypertension, hyperlipidemia presenting for x3 small abscesses in right inguinal fold.  Patient presenting with mild discomfort, reports abscesses are not getting worse.  No fever, chills, nausea vomiting or diarrhea.  She has taken ciprofloxacin for boils in the past, which helped resolve symptoms. Patient does not want to have the abscess drained today, she would like to try PO abx first.    Abscess      Home Medications Prior to Admission medications   Medication Sig Start Date End Date Taking? Authorizing Provider  sulfamethoxazole-trimethoprim (BACTRIM DS) 800-160 MG tablet Take 1 tablet by mouth 2 (two) times daily. Take 1 bid 11/07/22   Cyril Mourning A, NP  atorvastatin (LIPITOR) 20 MG tablet Take 1 tablet (20 mg total) by mouth daily. 03/14/15   Jacquelin Hawking, PA-C  ciprofloxacin (CIPRO) 500 MG tablet Take 1 tablet (500 mg total) by mouth 2 (two) times daily. 08/17/22   Adline Potter, NP  gabapentin (NEURONTIN) 300 MG capsule Take 300 mg by mouth daily. Patient not taking: Reported on 08/23/2022 08/15/22   [provider]  lisinopril-hydrochlorothiazide (PRINZIDE,ZESTORETIC) 20-12.5 MG tablet Take 1 tablet by mouth daily.    [provider]  meclizine (ANTIVERT) 25 MG tablet Take 1 tablet (25 mg total) by mouth 3 (three) times daily as needed for dizziness. Patient not taking: Reported on 08/23/2022 06/20/22   Prosperi, Christian H, PA-C  methocarbamol (ROBAXIN) 500 MG tablet Take 1 tablet (500 mg total) by mouth every 8 (eight) hours as needed for muscle spasms. 10/23/22   Benjiman Core, MD  Multiple Vitamin (MULTIVITAMIN WITH MINERALS) TABS tablet Take 1 tablet by mouth daily.    [provider]  ondansetron (ZOFRAN) 4 MG tablet Take 1 tablet (4 mg total) by mouth every 6 (six) hours. Patient not taking: Reported on 08/23/2022 07/17/22   Triplett, Tammy, PA-C  oxyCODONE (OXY IR/ROXICODONE) 5 MG immediate release tablet Take 5 mg by mouth every 6 (six) hours as needed. Patient not taking: Reported on 08/23/2022 10/02/21   [provider]  potassium chloride SA (KLOR-CON) 20 MEQ tablet TAKE (1) TABLET BY MOUTH TWICE DAILY. 01/31/21   Johney Maine, MD  silver sulfADIAZINE (SILVADENE) 1 % cream Apply 1 Application topically daily. 08/01/22   Adline Potter, NP      Allergies    Penicillins, Penicillins cross reactors, and Tape    Review of Systems   Review of Systems  Skin:  Positive for rash.    Physical Exam Updated Vital Signs BP 107/61   Pulse 84   Temp 99 F (37.2 C)   Resp 18   Ht 5\' 4"  (1.626 m)   Wt 74.2 kg   LMP 07/18/2010   SpO2 100%   BMI 28.08 kg/m  Physical Exam Vitals and nursing note reviewed.  Constitutional:      General: She is not in acute distress.    Appearance: She is not toxic-appearing.  HENT:     Head: Normocephalic and atraumatic.  Eyes:     General: No scleral icterus.    Conjunctiva/sclera: Conjunctivae normal.  Cardiovascular:     Rate and Rhythm: Normal rate and regular rhythm.  Pulses: Normal pulses.     Heart sounds: Normal heart sounds.  Pulmonary:     Effort: Pulmonary effort is normal. No respiratory distress.     Breath sounds: Normal breath sounds.  Abdominal:     General: Abdomen is flat. Bowel sounds are normal.     Palpations: Abdomen is soft.     Tenderness: There is no abdominal tenderness.  Skin:    General: Skin is warm and dry.     Findings: Lesion present.     Comments: X3 small nodules in right inguinal fold - no sign of surrounding erythema, edema, necrosis  Neurological:     General: No focal deficit present.     Mental Status: She is alert and oriented to person, place, and  time. Mental status is at baseline.     ED Results / Procedures / Treatments   Labs (all labs ordered are listed, but only abnormal results are displayed) Labs Reviewed - No data to display  EKG None  Radiology No results found.  Procedures Procedures    Medications Ordered in ED Medications - No data to display  ED Course/ Medical Decision Making/ A&P                                 Medical Decision Making Risk Prescription drug management.   This patient presents to the ED for concern of x3 abscess , this involves an extensive number of treatment options, and is a complaint that carries with it a high risk of complications and morbidity.  The differential diagnosis includes cellulitis, sti, uti, abscess, cyst, lipoma    Co morbidities that complicate the patient evaluation  DM   Additional history obtained:  Additional history obtained from chart review    Lab Tests:  I Ordered, and personally interpreted labs.  The pertinent results include: Dynamically stable, not tachycardic, no fever, no chills.  Exam does not show any signs of erythema around the area, thus labs not ordered   Imaging Studies ordered:  None, patient has recurrent abscess in groin region    Cardiac Monitoring: / EKG:  none   Consultations Obtained:  None    Problem List / ED Course / Critical interventions / Medication management  Pt presenting for x3 small abscesses in right inguinal fold.  Patient presenting with mild discomfort, reports abscesses are not getting worse.  No fever, chills, nausea vomiting or diarrhea. Vitals stable, PE shows very small x3 abscess <1cm in diameter, though the symptoms have been ongoing for 2 weeks.  They have not progressively gotten worse, the abscesses are not getting bigger, I do not think she would benefit significantly from I&D due to current size, however offered to try and I&D, patient declined.  Past when she has had this rash, she has  been given antibiotics including Cipro and Bactrim.  I prescribed Cipro for symptoms as it helped in past. I do think she would benefit from following up with dermatology outpatient for hidradenitis suppurativa treatment.  I ordered medication including abx OP   Reevaluation of the patient after these medicines showed that the patient improved I have reviewed the patients home medicines and have made adjustments as needed   Plan Gave derm office number, call to schedule appointment for recurrent abscess and HS Follow up with PCP to ensure resolution of sx Take Cipro, as prescribed Return with new or worsening sx  Pt stable for d/c,  agreed and understands plan        Final Clinical Impression(s) / ED Diagnoses Final diagnoses:  None    Rx / DC Orders ED Discharge Orders     None         Smitty Knudsen, PA-C 12/10/22 1816    Derwood Kaplan, MD 12/13/22 2202

## 2022-12-10 NOTE — ED Triage Notes (Signed)
Pt c/o abscess to groin x 2 weeks.

## 2022-12-10 NOTE — Discharge Instructions (Addendum)
Seen in the emergency room today for abscess.  You are prescribed antibiotic called Cipro, please take as prescribed.  Call dermatology and schedule follow-up appointment.  Turn to the emergency room with new or worsening symptoms. Please return if treatment does not improve symptoms.

## 2022-12-14 ENCOUNTER — Other Ambulatory Visit (HOSPITAL_COMMUNITY): Payer: Self-pay | Admitting: Gerontology

## 2022-12-14 ENCOUNTER — Ambulatory Visit (HOSPITAL_COMMUNITY)
Admission: RE | Admit: 2022-12-14 | Discharge: 2022-12-14 | Disposition: A | Discharge status: Home / Self Care | Payer: Medicaid Other | Source: Ambulatory Visit | Attending: Gerontology | Admitting: Gerontology

## 2022-12-14 DIAGNOSIS — F17200 Nicotine dependence, unspecified, uncomplicated: Secondary | ICD-10-CM | POA: Insufficient documentation

## 2023-01-14 NOTE — Telephone Encounter (Signed)
error 

## 2023-02-06 ENCOUNTER — Ambulatory Visit (INDEPENDENT_AMBULATORY_CARE_PROVIDER_SITE_OTHER): Payer: Medicaid Other | Admitting: Adult Health

## 2023-02-06 ENCOUNTER — Encounter: Payer: Self-pay | Admitting: Adult Health

## 2023-02-06 VITALS — BP 120/66 | HR 75 | Ht 65.0 in | Wt 160.5 lb

## 2023-02-06 DIAGNOSIS — R630 Anorexia: Secondary | ICD-10-CM

## 2023-02-06 DIAGNOSIS — L732 Hidradenitis suppurativa: Secondary | ICD-10-CM | POA: Diagnosis not present

## 2023-02-06 DIAGNOSIS — L0292 Furuncle, unspecified: Secondary | ICD-10-CM | POA: Diagnosis not present

## 2023-02-06 DIAGNOSIS — F17200 Nicotine dependence, unspecified, uncomplicated: Secondary | ICD-10-CM | POA: Diagnosis not present

## 2023-02-06 DIAGNOSIS — R319 Hematuria, unspecified: Secondary | ICD-10-CM | POA: Diagnosis not present

## 2023-02-06 LAB — POCT URINALYSIS DIPSTICK
Glucose, UA: NEGATIVE
Ketones, UA: NEGATIVE
Leukocytes, UA: NEGATIVE
Nitrite, UA: NEGATIVE
Protein, UA: NEGATIVE

## 2023-02-06 MED ORDER — DOXYCYCLINE HYCLATE 100 MG PO TABS: 100.0000 mg | ORAL_TABLET | Freq: Two times a day (BID) | ORAL | 1 refills | Status: DC

## 2023-02-06 MED ORDER — SILVER SULFADIAZINE 1 % EX CREA: 1.0000 | TOPICAL_CREAM | Freq: Every day | CUTANEOUS | 1 refills | Status: DC

## 2023-02-06 NOTE — Progress Notes (Signed)
Subjective:     Patient ID: Amber Drake, female   DOB: 10/20/1960, 62 y.o.   MRN: [id number]  HPI Amber Drake is a 62 year old black female, married, PM in complaining of recurrent boils. She has lost about 9 1/2 lbs since May, has decreased appetite. Has had labs with oncology. Has hx blood in urine.     Component Value Date/Time   DIAGPAP  08/23/2022 1330    - Negative for intraepithelial lesion or malignancy (NILM)   DIAGPAP  08/18/2021 1020    - Negative for intraepithelial lesion or malignancy (NILM)   DIAGPAP  08/16/2020 1022    - Negative for intraepithelial lesion or malignancy (NILM)   HPVHIGH Negative 08/23/2022 1330   HPVHIGH Positive (A) 08/18/2021 1020   HPVHIGH Positive (A) 08/16/2020 1022   ADEQPAP  08/23/2022 1330    Satisfactory for evaluation; transformation zone component ABSENT.   ADEQPAP  08/18/2021 1020    Satisfactory for evaluation; transformation zone component PRESENT.   ADEQPAP  08/16/2020 1022    Satisfactory for evaluation; transformation zone component PRESENT.   PCP is Dr Felecia Shelling  Review of Systems +recurrent boils Has lost about 9 1/2 lbs since May Decreased appetite Hx blood in urine  Reviewed past medical,surgical, social and family history. Reviewed medications and allergies.     Objective:   Physical Exam BP 120/66 (BP Location: Left Arm, Patient Position: Sitting, Cuff Size: Normal)   Pulse 75   Ht 5\' 5"  (1.651 m)   Wt 160 lb 8 oz (72.8 kg)   LMP 07/18/2010   BMI 26.71 kg/m  urine dipstick 2+blood   Skin warm and dry.Pelvic: external genitalia has scarring from HS, has pea sized boil left labia, and 2 cm boil in peri area and another right groin area, and  vagina: pink,urethra has no lesions or masses noted, cervix:smooth, uterus: normal size, shape and contour, non tender, no masses felt, adnexa: no masses or tenderness noted. Bladder is non tender and no masses felt. Fall risk is low  Upstream - 02/06/23 1439       Pregnancy Intention  Screening   Does the patient want to become pregnant in the next year? N/A    Does the patient's partner want to become pregnant in the next year? N/A    Would the patient like to discuss contraceptive options today? N/A      Contraception Wrap Up   Current Method Abstinence   PM   End Method Abstinence   PM   Contraception Counseling Provided No            Examination chaperoned by Malachy Mood LPN Assessment:     1. Hematuria, unspecified type +blood in urine will send for UA C&S  - POCT Urinalysis Dipstick - Urine Culture - Urinalysis, Routine w reflex microscopic  2. Recurrent boils has pea sized boil left labia, and 2 cm boil in peri area and another right groin area Will rx doxycycline and silvadene Meds ordered this encounter  Medications   doxycycline (VIBRA-TABS) 100 MG tablet    Sig: Take 1 tablet (100 mg total) by mouth 2 (two) times daily.    Dispense:  20 tablet    Refill:  1    Order Specific Question:   Supervising Provider    Answer:   Duane Lope H [[id number]]   silver sulfADIAZINE (SILVADENE) 1 % cream    Sig: Apply 1 Application topically daily.    Dispense:  50 g    Refill:  1    Order Specific Question:   Supervising Provider    Answer:   Despina Hidden, LUTHER H [[id number]]    3. Hidradenitis suppurativa +scarring   4. Smoker Decrease smoking  5. Decreased appetite Try to decrease smoking Add protein shake    Plan:     Follow up prn

## 2023-02-07 LAB — URINALYSIS, ROUTINE W REFLEX MICROSCOPIC
Bilirubin, UA: NEGATIVE
Glucose, UA: NEGATIVE
Ketones, UA: NEGATIVE
Leukocytes,UA: NEGATIVE
Nitrite, UA: NEGATIVE
Protein,UA: NEGATIVE
Specific Gravity, UA: 1.016 (ref 1.005–1.030)
Urobilinogen, Ur: 0.2 mg/dL (ref 0.2–1.0)
pH, UA: 5.5 (ref 5.0–7.5)

## 2023-02-07 LAB — MICROSCOPIC EXAMINATION
Bacteria, UA: NONE SEEN
Casts: NONE SEEN /[LPF]
WBC, UA: NONE SEEN /[HPF] (ref 0–5)

## 2023-02-08 ENCOUNTER — Other Ambulatory Visit: Payer: Self-pay | Admitting: Adult Health

## 2023-02-08 DIAGNOSIS — R319 Hematuria, unspecified: Secondary | ICD-10-CM

## 2023-02-08 LAB — URINE CULTURE

## 2023-02-17 ENCOUNTER — Other Ambulatory Visit: Payer: Self-pay | Admitting: Adult Health

## 2023-02-21 ENCOUNTER — Telehealth: Payer: Self-pay | Admitting: Internal Medicine

## 2023-02-21 NOTE — Telephone Encounter (Signed)
Telephone call  

## 2023-02-21 NOTE — Telephone Encounter (Signed)
Amber Drake unable to forward to [facility].  Copied from CRM [id number]. Topic: Referral - Status >> Feb 21, 2023 11:45 AM Amber Drake wrote: Reason for CRM: Patient called to follow up on 02/06/23 Urinalysis. Patient was referred to Urology and was told they should be contacting her, but to date, they have not reached out. Please follow up with patient.

## 2023-03-05 ENCOUNTER — Other Ambulatory Visit: Payer: Self-pay

## 2023-03-05 DIAGNOSIS — C884 Extranodal marginal zone b-cell lymphoma of mucosa-associated lymphoid tissue (malt-lymphoma) not having achieved remission: Secondary | ICD-10-CM

## 2023-03-06 ENCOUNTER — Inpatient Hospital Stay (HOSPITAL_BASED_OUTPATIENT_CLINIC_OR_DEPARTMENT_OTHER): Payer: Medicaid Other | Admitting: Hematology

## 2023-03-06 ENCOUNTER — Inpatient Hospital Stay: Payer: Medicaid Other | Attending: Hematology

## 2023-03-06 VITALS — BP 134/65 | HR 69 | Temp 97.7°F | Wt 163.6 lb

## 2023-03-06 DIAGNOSIS — R319 Hematuria, unspecified: Secondary | ICD-10-CM | POA: Diagnosis not present

## 2023-03-06 DIAGNOSIS — L0292 Furuncle, unspecified: Secondary | ICD-10-CM | POA: Insufficient documentation

## 2023-03-06 DIAGNOSIS — R634 Abnormal weight loss: Secondary | ICD-10-CM | POA: Insufficient documentation

## 2023-03-06 DIAGNOSIS — C884 Extranodal marginal zone b-cell lymphoma of mucosa-associated lymphoid tissue (malt-lymphoma) not having achieved remission: Secondary | ICD-10-CM

## 2023-03-06 DIAGNOSIS — C8309 Small cell B-cell lymphoma, extranodal and solid organ sites: Secondary | ICD-10-CM | POA: Insufficient documentation

## 2023-03-06 LAB — CMP (CANCER CENTER ONLY)
ALT: 13 U/L (ref 0–44)
AST: 17 U/L (ref 15–41)
Albumin: 4 g/dL (ref 3.5–5.0)
Alkaline Phosphatase: 73 U/L (ref 38–126)
Anion gap: 3 — ABNORMAL LOW (ref 5–15)
BUN: 11 mg/dL (ref 8–23)
CO2: 32 mmol/L (ref 22–32)
Calcium: 9.9 mg/dL (ref 8.9–10.3)
Chloride: 104 mmol/L (ref 98–111)
Creatinine: 0.72 mg/dL (ref 0.44–1.00)
GFR, Estimated: >60 mL/min (ref >60)
Glucose, Bld: 86 mg/dL (ref 70–99)
Potassium: 3.8 mmol/L (ref 3.5–5.1)
Sodium: 139 mmol/L (ref 135–145)
Total Bilirubin: 0.3 mg/dL (ref <1.2)
Total Protein: 6.4 g/dL — ABNORMAL LOW (ref 6.5–8.1)

## 2023-03-06 LAB — CBC WITH DIFFERENTIAL (CANCER CENTER ONLY)
Abs Immature Granulocytes: 0.02 10*3/uL (ref 0.00–0.07)
Basophils Absolute: 0 10*3/uL (ref 0.0–0.1)
Basophils Relative: 0 %
Eosinophils Absolute: 0.1 10*3/uL (ref 0.0–0.5)
Eosinophils Relative: 1 %
HCT: 36.5 % (ref 36.0–46.0)
Hemoglobin: 12.2 g/dL (ref 12.0–15.0)
Immature Granulocytes: 0 %
Lymphocytes Relative: 32 %
Lymphs Abs: 2.4 10*3/uL (ref 0.7–4.0)
MCH: 28.5 pg (ref 26.0–34.0)
MCHC: 33.4 g/dL (ref 30.0–36.0)
MCV: 85.3 fL (ref 80.0–100.0)
Monocytes Absolute: 0.4 10*3/uL (ref 0.1–1.0)
Monocytes Relative: 6 %
Neutro Abs: 4.6 10*3/uL (ref 1.7–7.7)
Neutrophils Relative %: 61 %
Platelet Count: 348 10*3/uL (ref 150–400)
RBC: 4.28 MIL/uL (ref 3.87–5.11)
RDW: 12.8 % (ref 11.5–15.5)
WBC Count: 7.5 10*3/uL (ref 4.0–10.5)
nRBC: 0 % (ref 0.0–0.2)

## 2023-03-06 LAB — LACTATE DEHYDROGENASE: LDH: 136 U/L (ref 98–192)

## 2023-03-07 DIAGNOSIS — F17200 Nicotine dependence, unspecified, uncomplicated: Secondary | ICD-10-CM | POA: Insufficient documentation

## 2023-03-07 DIAGNOSIS — R3129 Other microscopic hematuria: Secondary | ICD-10-CM | POA: Insufficient documentation

## 2023-03-07 NOTE — Progress Notes (Signed)
Name: Amber Drake DOB: [birth year] MRN: [id number]  History of Present Illness: Amber Drake is a 62 y.o. female who presents today as a new patient at [facility] [place].  She reports chief complaint of microscopic hematuria. > 01/02/2022:  - UA showed moderate blood.  - Urine microscopy showed 11-20 RBC/hpf. - No evidence of UTI.   > 02/06/2023:  - UA showed 1+ blood.  - Urine microscopy showed 3-10 RBC/hpf. - Negative urine culture.  > 03/06/2023: Normal renal function (creatinine 0.72, GFR >60).  Today: She denies increased urinary urgency, frequency, nocturia, dysuria, gross hematuria, hesitancy, straining to void, or sensations of incomplete emptying. She denies flank pain or abdominal pain.  She denies prior history of gross hematuria.  She denies history of kidney stones.  She denies history of pyelonephritis.  She denies history of recent or recurrent UTI. She denies history of GU malignancy or pelvic radiation.  She denies history of autoimmune disease. She reports history of smoking (has smoked 0.5 ppd x30 years). She reports taking anticoagulants (Aspirin 81 mg).   Fall Screening: Do you usually have a device to assist in your mobility? No   Medications: Current Outpatient Medications  Medication Sig Dispense Refill   atorvastatin (LIPITOR) 20 MG tablet Take 1 tablet (20 mg total) by mouth daily. 90 tablet 2   doxycycline (VIBRA-TABS) 100 MG tablet Take 1 tablet (100 mg total) by mouth 2 (two) times daily. 20 tablet 1   gabapentin (NEURONTIN) 300 MG capsule Take 300 mg by mouth daily.     lisinopril-hydrochlorothiazide (PRINZIDE,ZESTORETIC) 20-12.5 MG tablet Take 1 tablet by mouth daily.     meclizine (ANTIVERT) 25 MG tablet Take 1 tablet (25 mg total) by mouth 3 (three) times daily as needed for dizziness. 30 tablet 0   methocarbamol (ROBAXIN) 500 MG tablet Take 1 tablet (500 mg total) by mouth every 8 (eight) hours as needed for muscle spasms. 8  tablet 0   Multiple Vitamin (MULTIVITAMIN WITH MINERALS) TABS tablet Take 1 tablet by mouth daily.     ondansetron (ZOFRAN) 4 MG tablet Take 1 tablet (4 mg total) by mouth every 6 (six) hours. 15 tablet 0   potassium chloride SA (KLOR-CON) 20 MEQ tablet TAKE (1) TABLET BY MOUTH TWICE DAILY. 60 tablet 1   silver sulfADIAZINE (SILVADENE) 1 % cream Apply 1 Application topically daily. 50 g 1   oxyCODONE (OXY IR/ROXICODONE) 5 MG immediate release tablet Take 5 mg by mouth every 6 (six) hours as needed.     No current facility-administered medications for this visit.   Facility-Administered Medications Ordered in Other Visits  Medication Dose Route Frequency Provider Last Rate Last Admin   acetaminophen (TYLENOL) tablet 500 mg  500 mg Oral Once Johney Maine, MD        Allergies: Allergies  Allergen Reactions   Penicillins Anaphylaxis and Hives    Has patient had a PCN reaction causing immediate rash, facial/tongue/throat swelling, SOB or lightheadedness with hypotension: YES Has patient had a PCN reaction causing severe rash involving mucus membranes or skin necrosis: UNKNOWN  Has patient had a PCN reaction that required hospitalization: NO Has patient had a PCN reaction occurring within the last 10 years: NO If all of the above answers are "NO", then may proceed with Cephalosporin use.    Penicillins Cross Reactors Itching and Swelling    PENICILLIN > ANAPHYLAXIS   Tape Itching    Past Medical History:  Diagnosis Date   Anemia  Arthritis    Blood dyscrasia    sickle cell trait   Cancer (HCC)    lymphoma   Carbuncle and furuncle    Hypercholesterolemia    Hypertension    does not take meds   Papanicolaou smear of cervix with positive high risk human papilloma virus (HPV) test 08/23/2020   08/23/20    Colpo per ASCCP guidelines, immediate risk of CIN 3+ is 4.1 %   Sickle cell trait (HCC)    Past Surgical History:  Procedure Laterality Date   APPENDECTOMY      APPLICATION OF CRANIAL NAVIGATION N/A 07/28/2018   Procedure: APPLICATION OF CRANIAL NAVIGATION;  Surgeon: Donalee Citrin, MD;  Location: [facility] OR;  Service: Neurosurgery;  Laterality: N/A;   ARTHOSCOPIC ROTAOR CUFF REPAIR Right 08/24/2021   Procedure: ARTHROSCOPIC ROTATOR CUFF REPAIR;  Surgeon: Oliver Barre, MD;  Location: [facility] ORS;  Service: Orthopedics;  Laterality: Right;   BRAIN SURGERY  2020   CARPAL TUNNEL RELEASE  03/23/2011   Procedure: CARPAL TUNNEL RELEASE;  Surgeon: Darreld Mclean;  Location: [facility];  Service: Orthopedics;  Laterality: Left;   CARPAL TUNNEL RELEASE  05/10/2011   Procedure: CARPAL TUNNEL RELEASE;  Surgeon: Darreld Mclean, MD;  Location: [facility];  Service: Orthopedics;  Laterality: Right;   CESAREAN SECTION     x 2   COLONOSCOPY WITH PROPOFOL N/A 11/16/2020   Procedure: COLONOSCOPY WITH PROPOFOL;  Surgeon: Dolores Frame, MD;  Location: [facility];  Service: Gastroenterology;  Laterality: N/A;  9:15   INCISION AND DRAINAGE PERIRECTAL ABSCESS  12/21/2009   LUMBAR LAMINECTOMY/DECOMPRESSION MICRODISCECTOMY Left 12/12/2020   Procedure: Laminectomy and Foraminotomy - left - L5-S1;  Surgeon: Donalee Citrin, MD;  Location: [facility] OR;  Service: Neurosurgery;  Laterality: Left;  3C   PR DURAL GRAFT SPINAL N/A 07/28/2018   Procedure: Stereotactic open biopsy of Right cerebellar hemisphere and dura with brainlab;  Surgeon: Donalee Citrin, MD;  Location: [facility] OR;  Service: Neurosurgery;  Laterality: N/A;  Stereotactic open biopsy of Right cerebellar hemisphere and dura with brainlab   PROCTOSCOPY  10/17/2010   Procedure: PROCTOSCOPY;  Surgeon: Marlane Hatcher;  Location: [facility];  Service: General;  Laterality: N/A;  Rigid Proctoscopy/Possible Fistula in Ano  Procedure ended at 1003   SHOULDER ARTHROSCOPY WITH BICEPSTENOTOMY Right 08/24/2021   Procedure: SHOULDER ARTHROSCOPY WITH BICEPSTENOTOMY;  Surgeon: Oliver Barre, MD;  Location: [facility];  Service: Orthopedics;  Laterality: Right;    THERAPEUTIC ABORTION     x2   Family History  Problem Relation Age of Onset   Hypertension Mother    Hyperlipidemia Mother    Hypertension Father    Lupus Father        skin   Sarcoidosis Sister    Kidney failure Daughter    Sickle cell anemia Daughter    Sickle cell trait Daughter    Sickle cell anemia Son    Hypertension Other    Anesthesia problems Neg Hx    Hypotension Neg Hx    Malignant hyperthermia Neg Hx    Pseudochol deficiency Neg Hx    Cancer - Colon Neg Hx    Social History   Socioeconomic History   Marital status: Married    Spouse name: Not on file   Number of children: Not on file   Years of education: Not on file   Highest education level: Not on file  Occupational History   Not on file  Tobacco Use   Smoking status: Every Day  Current packs/day: 0.50    Average packs/day: 0.5 packs/day for 30.0 years (15.0 ttl pk-yrs)    Types: Cigarettes   Smokeless tobacco: Never  Vaping Use   Vaping status: Never Used  Substance and Sexual Activity   Alcohol use: No   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   Sexual activity: Not Currently    Partners: Male    Birth control/protection: Post-menopausal, Abstinence  Other Topics Concern   Not on file  Social History Narrative   Not on file   Social Determinants of Health   Financial Resource Strain: Low Risk  (08/23/2022)   Overall Financial Resource Strain (CARDIA)    Difficulty of Paying Living Expenses: Not hard at all  Food Insecurity: No Food Insecurity (08/23/2022)   Hunger Vital Sign    Worried About Running Out of Food in the Last Year: Never true    Ran Out of Food in the Last Year: Never true  Transportation Needs: No Transportation Needs (08/23/2022)   PRAPARE - Administrator, Civil Service (Medical): No    Lack of Transportation (Non-Medical): No  Physical Activity: Sufficiently Active (08/23/2022)   Exercise Vital Sign    Days of Exercise per Week: 3 days    Minutes of  Exercise per Session: 60 min  Stress: No Stress Concern Present (08/23/2022)   [employer] of Occupational Health - Occupational Stress Questionnaire    Feeling of Stress : Not at all  Social Connections: Socially Integrated (08/23/2022)   Social Connection and Isolation Panel [NHANES]    Frequency of Communication with Friends and Family: More than three times a week    Frequency of Social Gatherings with Friends and Family: More than three times a week    Attends Religious Services: More than 4 times per year    Active Member of [employer] or Organizations: Yes    Attends Banker Meetings: 1 to 4 times per year    Marital Status: Married  Catering manager Violence: Not At Risk (08/23/2022)   Humiliation, Afraid, Rape, and Kick questionnaire    Fear of Current or Ex-Partner: No    Emotionally Abused: No    Physically Abused: No    Sexually Abused: No    SUBJECTIVE  Review of Systems Constitutional: Patient denies any unintentional weight loss or change in strength lntegumentary: Patient denies any rashes or pruritus Cardiovascular: Patient denies chest pain or syncope Respiratory: Patient denies shortness of breath Gastrointestinal: Patient denies nausea, vomiting, constipation, or diarrhea Musculoskeletal: Patient denies muscle cramps or weakness Neurologic: Patient denies convulsions or seizures Allergic/Immunologic: Patient denies recent allergic reaction(s) Hematologic/Lymphatic: Patient denies bleeding tendencies Endocrine: Patient denies heat/cold intolerance  GU: As per HPI.  OBJECTIVE Vitals:   03/08/23 1123  BP: 133/79  Pulse: 80  Temp: 98.2 F (36.8 C)   There is no height or weight on file to calculate BMI.  Physical Examination Constitutional: No obvious distress; patient is non-toxic appearing  Cardiovascular: No visible lower extremity edema.  Respiratory: The patient does not have audible wheezing/stridor; respirations do not appear labored   Gastrointestinal: Abdomen non-distended Musculoskeletal: Normal ROM of UEs  Skin: No obvious rashes/open sores  Neurologic: CN 2-12 grossly intact Psychiatric: Answered questions appropriately with normal affect  Hematologic/Lymphatic/Immunologic: No obvious bruises or sites of spontaneous bleeding  UA: 3-10 RBC/hpf with no evidence of UTI PVR: 57 ml  ASSESSMENT Microscopic hematuria - Plan: Urinalysis, Routine w reflex microscopic, BLADDER SCAN AMB NON-IMAGING,  CT HEMATURIA WORKUP  Smoker - Plan: Urinalysis, Routine w reflex microscopic, BLADDER SCAN AMB NON-IMAGING, CT HEMATURIA WORKUP  For asymptomatic microscopic hematuria we discussed possible etiologies including but not limited to: vigorous exercise, sexual activity, stone, trauma, blood thinner use, urinary tract infection, urethral irritation secondary to vaginal atrophy, chronic kidney disease, glomerulonephropathy, malignancy. We discussed pt's smoking as a risk factor for GU cancer and encouraged smoking cessation.  We reviewed the AUA 2020 [facility] guideline and risk stratification for this patient. Based on individual risk factors, pt was advised that the recommended workup includes CT hematuria protocol and cystoscopy. Pt decided to pursue this work-up and follow-up afterward to discuss the results and formulate a treatment plan based on the findings. All questions were answered.  PLAN Advised the following: CT hematuria protocol ordered. 2. Return for 1st available cystoscopy with any urology MD.  Orders Placed This Encounter  Procedures   CT HEMATURIA WORKUP    Standing Status:   Future    Standing Expiration Date:   03/07/2024    Order Specific Question:   Reason for Exam (SYMPTOM  OR DIAGNOSIS REQUIRED)    Answer:   Microscopic hematuria    Order Specific Question:   Preferred imaging location?    Answer:   [facility]   Urinalysis, Routine w reflex microscopic   BLADDER SCAN AMB NON-IMAGING    It has been  explained that the patient is to follow regularly with their PCP in addition to all other providers involved in their care and to follow instructions provided by these respective offices. Patient advised to contact urology clinic if any urologic-pertaining questions, concerns, new symptoms or problems arise in the interim period.  There are no Patient Instructions on file for this visit.  Electronically signed by:  Donnita Falls, MSN, FNP-C, CUNP 03/08/2023 11:56 AM

## 2023-03-08 ENCOUNTER — Ambulatory Visit (INDEPENDENT_AMBULATORY_CARE_PROVIDER_SITE_OTHER): Payer: Medicaid Other | Admitting: Urology

## 2023-03-08 ENCOUNTER — Encounter: Payer: Self-pay | Admitting: Urology

## 2023-03-08 VITALS — BP 133/79 | HR 80 | Temp 98.2°F

## 2023-03-08 DIAGNOSIS — F17200 Nicotine dependence, unspecified, uncomplicated: Secondary | ICD-10-CM | POA: Diagnosis not present

## 2023-03-08 DIAGNOSIS — R3129 Other microscopic hematuria: Secondary | ICD-10-CM

## 2023-03-08 LAB — MICROSCOPIC EXAMINATION: Bacteria, UA: NONE SEEN

## 2023-03-08 LAB — URINALYSIS, ROUTINE W REFLEX MICROSCOPIC
Bilirubin, UA: NEGATIVE
Glucose, UA: NEGATIVE
Ketones, UA: NEGATIVE
Leukocytes,UA: NEGATIVE
Nitrite, UA: NEGATIVE
Protein,UA: NEGATIVE
Specific Gravity, UA: 1.01 (ref 1.005–1.030)
Urobilinogen, Ur: 0.2 mg/dL (ref 0.2–1.0)
pH, UA: 6 (ref 5.0–7.5)

## 2023-03-12 NOTE — Progress Notes (Signed)
[facility] OFFICE PROGRESS NOTE DOS .03/06/2023  Amber Spar, MD 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  CURRENT THERAPY: maintenance Rituxan q8weeks  INTERVAL HISTORY:  Amber Drake is here for continued evaluation and management of extranodal marginal zone lymphoma.  .Discussed the use of AI scribe software for clinical note transcription with the patient, who gave verbal consent to proceed.  Miss Varnadoe, a patient with a history of marginal zone lymphoma, presents for a six-month follow-up. The patient reports unintentional weight loss of nine pounds over the past six months, which she attributes to increased physical activity at work. However, she has regained three pounds recently. She denies any new neurological symptoms, such as headaches or vision changes, related to her lymphoma.  The patient also reports a long-standing issue of blood in the urine, detected on routine studies, but not visible to the naked eye. She denies any associated discomfort, abdominal pain, or distention. She has an upcoming appointment with a urologist to further investigate this issue.  Additionally, the patient has been experiencing recurrent boils, primarily located on the side of her body. She has been managing these with both topical and oral antibiotics, with variable success. She speculates that the consumption of soda may be contributing to the occurrence of these boils.  The patient denies any new back pain, infection issues, changes in bowel habits, leg swelling, or bone pain. She has been maintaining her active lifestyle, working at a nursing home, and plans to transition to part-time work upon retirement in the near future. She has not noticed any new breast lumps and her recent mammogram results were satisfactory.      MEDICAL  HISTORY: Past Medical History:  Diagnosis Date   Anemia    Arthritis    Blood dyscrasia    sickle cell trait   Cancer (HCC)    lymphoma   Carbuncle and furuncle    Hypercholesterolemia    Hypertension    does not take meds   Papanicolaou smear of cervix with positive high risk human papilloma virus (HPV) test 08/23/2020   08/23/20    Colpo per ASCCP guidelines, immediate risk of CIN 3+ is 4.1 %   Sickle cell trait (HCC)     ALLERGIES:  is allergic to penicillins, penicillins cross reactors, and tape.  MEDICATIONS:  Current Outpatient Medications  Medication Sig Dispense Refill   atorvastatin (LIPITOR) 20 MG tablet Take 1 tablet (20 mg total) by mouth daily. 90 tablet 2   doxycycline (VIBRA-TABS) 100 MG tablet Take 1 tablet (100 mg total) by mouth 2 (two) times daily. 20 tablet 1   gabapentin (NEURONTIN) 300 MG capsule Take 300 mg by mouth daily.     lisinopril-hydrochlorothiazide (PRINZIDE,ZESTORETIC) 20-12.5 MG tablet Take 1 tablet by mouth daily.     meclizine (ANTIVERT) 25 MG tablet Take 1 tablet (25 mg total) by mouth 3 (three) times daily as needed for dizziness. 30 tablet 0   methocarbamol (ROBAXIN) 500 MG tablet Take 1 tablet (500 mg total) by mouth every 8 (eight) hours as needed for muscle spasms. 8 tablet 0   Multiple Vitamin (MULTIVITAMIN WITH MINERALS) TABS tablet Take 1 tablet by mouth daily.     ondansetron (ZOFRAN) 4 MG tablet Take 1 tablet (4 mg total) by mouth every 6 (six) hours. 15 tablet 0   potassium chloride SA (KLOR-CON) 20 MEQ tablet TAKE (1) TABLET BY  MOUTH TWICE DAILY. 60 tablet 1   silver sulfADIAZINE (SILVADENE) 1 % cream Apply 1 Application topically daily. 50 g 1   oxyCODONE (OXY IR/ROXICODONE) 5 MG immediate release tablet Take 5 mg by mouth every 6 (six) hours as needed.     No current facility-administered medications for this visit.   Facility-Administered Medications Ordered in Other Visits  Medication Dose Route Frequency Provider Last Rate Last  Admin   acetaminophen (TYLENOL) tablet 500 mg  500 mg Oral Once Johney Maine, MD        SURGICAL HISTORY:  Past Surgical History:  Procedure Laterality Date   APPENDECTOMY     APPLICATION OF CRANIAL NAVIGATION N/A 07/28/2018   Procedure: APPLICATION OF CRANIAL NAVIGATION;  Surgeon: Donalee Citrin, MD;  [facility];  Service: Neurosurgery;  Laterality: N/A;   ARTHOSCOPIC ROTAOR CUFF REPAIR Right 08/24/2021   Procedure: ARTHROSCOPIC ROTATOR CUFF REPAIR;  Surgeon: Oliver Barre, MD;  Location: [facility] ORS;  Service: Orthopedics;  Laterality: Right;   BRAIN SURGERY  2020   CARPAL TUNNEL RELEASE  03/23/2011   Procedure: CARPAL TUNNEL RELEASE;  Surgeon: Darreld Mclean;  Location: [facility] ORS;  Service: Orthopedics;  Laterality: Left;   CARPAL TUNNEL RELEASE  05/10/2011   Procedure: CARPAL TUNNEL RELEASE;  Surgeon: Darreld Mclean, MD;  Location: [facility] ORS;  Service: Orthopedics;  Laterality: Right;   CESAREAN SECTION     x 2   COLONOSCOPY WITH PROPOFOL N/A 11/16/2020   Procedure: COLONOSCOPY WITH PROPOFOL;  Surgeon: Dolores Frame, MD;  Location: [facility]ITE;  Service: Gastroenterology;  Laterality: N/A;  9:15   INCISION AND DRAINAGE PERIRECTAL ABSCESS  12/21/2009   LUMBAR LAMINECTOMY/DECOMPRESSION MICRODISCECTOMY Left 12/12/2020   Procedure: Laminectomy and Foraminotomy - left - L5-S1;  Surgeon: Donalee Citrin, MD;  Location: [facility] OR;  Service: Neurosurgery;  Laterality: Left;  3C   PR DURAL GRAFT SPINAL N/A 07/28/2018   Procedure: Stereotactic open biopsy of Right cerebellar hemisphere and dura with brainlab;  Surgeon: Donalee Citrin, MD;  Location: [facility] OR;  Service: Neurosurgery;  Laterality: N/A;  Stereotactic open biopsy of Right cerebellar hemisphere and dura with brainlab   PROCTOSCOPY  10/17/2010   Procedure: PROCTOSCOPY;  Surgeon: Marlane Hatcher;  Location: [facility];  Service: General;  Laterality: N/A;  Rigid Proctoscopy/Possible Fistula in Ano  Procedure ended at 1003   SHOULDER  ARTHROSCOPY WITH BICEPSTENOTOMY Right 08/24/2021   Procedure: SHOULDER ARTHROSCOPY WITH BICEPSTENOTOMY;  Surgeon: Oliver Barre, MD;  Location: [facility] OR[facility];  Service: Orthopedics;  Laterality: Right;   THERAPEUTIC ABORTION     x2    REVIEW OF SYSTEMS:   .10 Point review of Systems was done is negative except as noted above.   PHYSICAL EXAMINATION:  Blood pressure 134/65, pulse 69, temperature 97.7 F (36.5 C), weight 163 lb 9 oz (74.2 kg), last menstrual period 07/18/2010, SpO2 100%. GENERAL:alert, in no acute distress and comfortable SKIN: no acute rashes, no significant lesions EYES: conjunctiva are pink and non-injected, sclera anicteric OROPHARYNX: MMM, no exudates, no oropharyngeal erythema or ulceration NECK: supple, no JVD LYMPH:  no palpable lymphadenopathy in the cervical, axillary or inguinal regions LUNGS: clear to auscultation b/l with normal respiratory effort HEART: regular rate & rhythm ABDOMEN:  normoactive bowel sounds , non tender, not distended. Extremity: no pedal edema PSYCH: alert & oriented x 3 with fluent speech NEURO: no focal motor/sensory deficits    LABORATORY DATA:     Latest Ref Rng & Units 03/06/2023   11:17 AM  10/23/2022    7:46 PM 09/19/2022    9:37 AM  CBC  WBC 4.0 - 10.5 K/uL 7.5  6.4  6.6   Hemoglobin 12.0 - 15.0 g/dL 16.1  09.6  04.5   Hematocrit 36.0 - 46.0 % 36.5  33.2  36.9   Platelets 150 - 400 K/uL 348  388  392     .    Latest Ref Rng & Units 03/06/2023   11:17 AM 10/23/2022    7:46 PM 09/19/2022    9:37 AM  CMP  Glucose 70 - 99 mg/dL 86  91  80   BUN 8 - 23 mg/dL 11  11  13    Creatinine 0.44 - 1.00 mg/dL 4.09  8.11  9.14   Sodium 135 - 145 mmol/L 139  137  138   Potassium 3.5 - 5.1 mmol/L 3.8  3.5  4.5   Chloride 98 - 111 mmol/L 104  103  105   CO2 22 - 32 mmol/L 32  24  29   Calcium 8.9 - 10.3 mg/dL 9.9  9.3  78.2   Total Protein 6.5 - 8.1 g/dL 6.4   6.9   Total Bilirubin <1.2 mg/dL 0.3   0.4   Alkaline Phos 38 - 126 U/L 73    72   AST 15 - 41 U/L 17   19   ALT 0 - 44 U/L 13   14     . Lab Results  Component Value Date   LDH 136 03/06/2023    RADIOGRAPHIC STUDIES:  No results found.  MRI Brain 06/20/2022: IMPRESSION: 1. No evidence of acute intracranial abnormality or metastatic disease. 2. 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     Electronically Signed   By: Feliberto Harts M.D.   On: 06/20/2022 15:16   ASSESSMENT/PLAN:   62 y.o. female with   1. 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 The process appears to extend into the upper cervical canal. Mild obstructive hydrocephalus. Differential includes tumor including metastatic disease and lymphoma. Chronic inflammatory process such as sarcoid is a consideration however chest x-ray negative. TB and atypical infection/fungus also possible. 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 2. Nonspecific focus of increased uptake within the cord extending from T12 to L1. Cannot rule out additional site of CNS lymphoma. Consider further evaluation with contrast enhanced MRI through this area. 3. Aortic Atherosclerosis and Emphysema."   10/16/2018 MRI brain w and w/o contrast revealed "1. Resolved dural mass in the right  posterior fossa with minimal smooth dural thickening that may be treatment related. Cerebellar edema and mass effect is also resolved. No new site of disease. 2. 6 mm colloid cyst."   10/20/2018 MRI cervical spine w and w/o contrast revealed "Negative for lymphoma.  No acute abnormality. Central disc protrusion at C5-6 effaces  the ventral thecal sac causing mild central canal narrowing. There is also a shallow disc bulge at C6-7 which narrows but does not efface the ventral thecal Sac."   01/13/2019 MRI Brain ([id number]) revealed "1. Stable and satisfactory post treatment appearance of the posterior fossa. 2. Stable small 6 mm colloid cyst. 3. No new intracranial abnormality."   05/06/2019 MRI Cervical Spine ([id number]) revealed "No evidence of lymphoma in the cervical spine. Previously noted dural enhancement in the posterior fossa and cervical canal has resolved. No cord compression or cord lesion. Chronic cervical spine degenerative changes are stable from the prior study."   12/15/2019 MRI Brain ([id number]) revealed "No acute intracranial abnormality. No enhancing mass lesion 5 mm colloid cyst in the third ventricle is chronic and unchanged from prior studies."  08/14/2020, MR Brain ([id number]) which revealed "1. Unchanged appearance of the brain. No evidence of recurrent intracranial lymphoma. 2. Small colloid cyst without hydrocephalus."   06/22/2021 MRI brain- Stable MRI. No recurrent mass in the right posterior fossa at the site of prior craniotomy.   PLAN:    Hematuria Asymptomatic, microscopic hematuria noted on routine urine studies. No visible hematuria, dysuria, or abdominal pain. Urology consult scheduled. -Continue with urology appointment on Friday for further evaluation.  Meningeal/CNS Marginal Zone Lymphoma No new neurological symptoms, headaches, or vision changes. Stable condition with no signs of recurrence. -Plan MRI of the brain before next visit in six months to  monitor for any changes.  Unintentional Weight Loss Lost nine pounds but regained three. Active lifestyle with high physical activity at work. No decrease in appetite or energy levels. -Monitor weight. If weight loss persists, consider CT chest, abdomen, pelvis to rule out lymphoma recurrence.  Recurrent Boils Recurrent boils, possibly related to hidradenitis suppurativa or MRSA colonization. Currently using topical and oral antibiotics. -Continue current treatment. Consider lifestyle modifications such as reducing soda intake and maintaining good hygiene.  General Health Maintenance -Continue regular mammograms and colonoscopies as per guidelines. -Plan for retirement in January.      FOLLOW UP: MRI brain in 22 weeks RTC with Dr Candise Che with labs in 24 weeks .The total time spent in the appointment was 20 minutes* .  All of the patient's questions were answered with apparent satisfaction. The patient knows to call the clinic with any problems, questions or concerns.   Wyvonnia Lora MD MS AAHIVMS [facility] Hematology/Oncology Physician [facility]  .*Total Encounter Time as defined by the Centers for Medicare and Medicaid Services includes, in addition to the face-to-face time of a patient visit (documented in the note above) non-face-to-face time: obtaining and reviewing outside history, ordering and reviewing medications, tests or procedures, care coordination (communications with other health care professionals or caregivers) and documentation in the medical record.

## 2023-03-29 ENCOUNTER — Ambulatory Visit (HOSPITAL_COMMUNITY): Payer: Medicaid Other

## 2023-04-04 ENCOUNTER — Ambulatory Visit (HOSPITAL_COMMUNITY)
Admission: RE | Admit: 2023-04-04 | Discharge: 2023-04-04 | Disposition: A | Discharge status: Home / Self Care | Payer: Medicaid Other | Source: Ambulatory Visit | Attending: Urology | Admitting: Urology

## 2023-04-04 DIAGNOSIS — F17200 Nicotine dependence, unspecified, uncomplicated: Secondary | ICD-10-CM | POA: Diagnosis present

## 2023-04-04 DIAGNOSIS — R3129 Other microscopic hematuria: Secondary | ICD-10-CM | POA: Insufficient documentation

## 2023-04-04 LAB — POCT I-STAT CREATININE: Creatinine, Ser: 0.8 mg/dL (ref 0.44–1.00)

## 2023-04-04 MED ORDER — IOHEXOL 300 MG/ML  SOLN
125.0000 mL | Freq: Once | INTRAMUSCULAR | Status: AC | PRN
  Administered 2023-04-04: 125 mL via INTRAVENOUS

## 2023-04-15 ENCOUNTER — Telehealth: Payer: Self-pay

## 2023-04-15 NOTE — Telephone Encounter (Signed)
 Patient was made aware and voiced understanding.

## 2023-04-15 NOTE — Telephone Encounter (Signed)
-----   Message from Lauraine JAYSON Oz sent at 04/15/2023  9:48 AM EST ----- Please let pt know her CT was unremarkable. No radiographic evidence of urinary tract neoplasm, urolithiasis, or hydronephrosis. Advised to f/u as scheduled with Dr. Wrenn on 04/25/2023 for further evaluation.

## 2023-04-25 ENCOUNTER — Ambulatory Visit: Payer: Medicaid Other | Admitting: Urology

## 2023-04-25 VITALS — BP 124/64 | HR 80

## 2023-04-25 DIAGNOSIS — R3129 Other microscopic hematuria: Secondary | ICD-10-CM

## 2023-04-25 DIAGNOSIS — L732 Hidradenitis suppurativa: Secondary | ICD-10-CM | POA: Diagnosis not present

## 2023-04-25 DIAGNOSIS — D573 Sickle-cell trait: Secondary | ICD-10-CM

## 2023-04-25 LAB — URINALYSIS, ROUTINE W REFLEX MICROSCOPIC
Bilirubin, UA: NEGATIVE
Glucose, UA: NEGATIVE
Ketones, UA: NEGATIVE
Leukocytes,UA: NEGATIVE
Nitrite, UA: NEGATIVE
Protein,UA: NEGATIVE
Specific Gravity, UA: 1.015 (ref 1.005–1.030)
Urobilinogen, Ur: 0.2 mg/dL (ref 0.2–1.0)
pH, UA: 6 (ref 5.0–7.5)

## 2023-04-25 LAB — MICROSCOPIC EXAMINATION: Bacteria, UA: NONE SEEN

## 2023-04-25 MED ORDER — CIPROFLOXACIN HCL 500 MG PO TABS
500.0000 mg | ORAL_TABLET | Freq: Once | ORAL | Status: AC
  Administered 2023-04-25: 500 mg via ORAL

## 2023-04-25 NOTE — Progress Notes (Signed)
Subjective:  1. Microscopic hematuria   2. Sickle cell trait (HCC)   3. Hydradenitis     04/25/23: Amber Drake returns today for cystoscopy to complete a hematuria w/u.  She has 11-30 RBC's on UA today and her CT was negative.  She has sickle cell trait but no GU history.  She reports a boil in the groin on the left and I will check that today.     ROS:  ROS:  A complete review of systems was performed.  All systems are negative except for pertinent findings as noted.   ROS  Allergies  Allergen Reactions   Penicillins Anaphylaxis and Hives    Has patient had a PCN reaction causing immediate rash, facial/tongue/throat swelling, SOB or lightheadedness with hypotension: YES Has patient had a PCN reaction causing severe rash involving mucus membranes or skin necrosis: UNKNOWN  Has patient had a PCN reaction that required hospitalization: NO Has patient had a PCN reaction occurring within the last 10 years: NO If all of the above answers are "NO", then may proceed with Cephalosporin use.    Penicillins Cross Reactors Itching and Swelling    PENICILLIN > ANAPHYLAXIS   Tape Itching    Outpatient Encounter Medications as of 04/25/2023  Medication Sig   atorvastatin (LIPITOR) 20 MG tablet Take 1 tablet (20 mg total) by mouth daily.   doxycycline (VIBRA-TABS) 100 MG tablet Take 1 tablet (100 mg total) by mouth 2 (two) times daily.   gabapentin (NEURONTIN) 300 MG capsule Take 300 mg by mouth daily.   lisinopril-hydrochlorothiazide (PRINZIDE,ZESTORETIC) 20-12.5 MG tablet Take 1 tablet by mouth daily.   meclizine (ANTIVERT) 25 MG tablet Take 1 tablet (25 mg total) by mouth 3 (three) times daily as needed for dizziness.   methocarbamol (ROBAXIN) 500 MG tablet Take 1 tablet (500 mg total) by mouth every 8 (eight) hours as needed for muscle spasms.   Multiple Vitamin (MULTIVITAMIN WITH MINERALS) TABS tablet Take 1 tablet by mouth daily.   ondansetron (ZOFRAN) 4 MG tablet Take 1 tablet (4 mg total)  by mouth every 6 (six) hours.   potassium chloride SA (KLOR-CON) 20 MEQ tablet TAKE (1) TABLET BY MOUTH TWICE DAILY.   silver sulfADIAZINE (SILVADENE) 1 % cream Apply 1 Application topically daily.   [DISCONTINUED] oxyCODONE (OXY IR/ROXICODONE) 5 MG immediate release tablet Take 5 mg by mouth every 6 (six) hours as needed.   Facility-Administered Encounter Medications as of 04/25/2023  Medication   acetaminophen (TYLENOL) tablet 500 mg   [COMPLETED] ciprofloxacin (CIPRO) tablet 500 mg    Past Medical History:  Diagnosis Date   Anemia    Arthritis    Blood dyscrasia    sickle cell trait   Cancer (HCC)    lymphoma   Carbuncle and furuncle    Hypercholesterolemia    Hypertension    does not take meds   Papanicolaou smear of cervix with positive high risk human papilloma virus (HPV) test 08/23/2020   08/23/20    Colpo per ASCCP guidelines, immediate risk of CIN 3+ is 4.1 %   Sickle cell trait (HCC)     Past Surgical History:  Procedure Laterality Date   APPENDECTOMY     APPLICATION OF CRANIAL NAVIGATION N/A 07/28/2018   Procedure: APPLICATION OF CRANIAL NAVIGATION;  Surgeon: Donalee Citrin, MD;  Location: [facility] OR;  Service: Neurosurgery;  Laterality: N/A;   ARTHOSCOPIC ROTAOR CUFF REPAIR Right 08/24/2021   Procedure: ARTHROSCOPIC ROTATOR CUFF REPAIR;  Surgeon: Oliver Barre, MD;  Location: [facility];  Service: Orthopedics;  Laterality: Right;   BRAIN SURGERY  2020   CARPAL TUNNEL RELEASE  03/23/2011   Procedure: CARPAL TUNNEL RELEASE;  Surgeon: Darreld Mclean;  Location: [facility];  Service: Orthopedics;  Laterality: Left;   CARPAL TUNNEL RELEASE  05/10/2011   Procedure: CARPAL TUNNEL RELEASE;  Surgeon: Darreld Mclean, MD;  Location: [facility];  Service: Orthopedics;  Laterality: Right;   CESAREAN SECTION     x 2   COLONOSCOPY WITH PROPOFOL N/A 11/16/2020   Procedure: COLONOSCOPY WITH PROPOFOL;  Surgeon: Dolores Frame, MD;  Location: [facility];  Service: Gastroenterology;   Laterality: N/A;  9:15   INCISION AND DRAINAGE PERIRECTAL ABSCESS  12/21/2009   LUMBAR LAMINECTOMY/DECOMPRESSION MICRODISCECTOMY Left 12/12/2020   Procedure: Laminectomy and Foraminotomy - left - L5-S1;  Surgeon: Donalee Citrin, MD;  Location: [facility] OR;  Service: Neurosurgery;  Laterality: Left;  3C   PR DURAL GRAFT SPINAL N/A 07/28/2018   Procedure: Stereotactic open biopsy of Right cerebellar hemisphere and dura with brainlab;  Surgeon: Donalee Citrin, MD;  Location: [facility] OR;  Service: Neurosurgery;  Laterality: N/A;  Stereotactic open biopsy of Right cerebellar hemisphere and dura with brainlab   PROCTOSCOPY  10/17/2010   Procedure: PROCTOSCOPY;  Surgeon: Marlane Hatcher;  Location: [facility];  Service: General;  Laterality: N/A;  Rigid Proctoscopy/Possible Fistula in Ano  Procedure ended at 1003   SHOULDER ARTHROSCOPY WITH BICEPSTENOTOMY Right 08/24/2021   Procedure: SHOULDER ARTHROSCOPY WITH BICEPSTENOTOMY;  Surgeon: Oliver Barre, MD;  Location: [facility];  Service: Orthopedics;  Laterality: Right;   THERAPEUTIC ABORTION     x2    Social History   Socioeconomic History   Marital status: Married    Spouse name: Not on file   Number of children: Not on file   Years of education: Not on file   Highest education level: Not on file  Occupational History   Not on file  Tobacco Use   Smoking status: Every Day    Current packs/day: 0.50    Average packs/day: 0.5 packs/day for 30.0 years (15.0 ttl pk-yrs)    Types: Cigarettes   Smokeless tobacco: Never  Vaping Use   Vaping status: Never Used  Substance and Sexual Activity   Alcohol use: No   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   Sexual activity: Not Currently    Partners: Male    Birth control/protection: Post-menopausal, Abstinence  Other Topics Concern   Not on file  Social History Narrative   Not on file   Social Drivers of Health   Financial Resource Strain: Low Risk  (08/23/2022)   Overall Financial Resource Strain  (CARDIA)    Difficulty of Paying Living Expenses: Not hard at all  Food Insecurity: No Food Insecurity (08/23/2022)   Hunger Vital Sign    Worried About Running Out of Food in the Last Year: Never true    Ran Out of Food in the Last Year: Never true  Transportation Needs: No Transportation Needs (08/23/2022)   PRAPARE - Administrator, Civil Service (Medical): No    Lack of Transportation (Non-Medical): No  Physical Activity: Sufficiently Active (08/23/2022)   Exercise Vital Sign    Days of Exercise per Week: 3 days    Minutes of Exercise per Session: 60 min  Stress: No Stress Concern Present (08/23/2022)   [employer] of Occupational Health - Occupational Stress Questionnaire    Feeling of Stress : Not at  all  Social Connections: Socially Integrated (08/23/2022)   Social Connection and Isolation Panel [NHANES]    Frequency of Communication with Friends and Family: More than three times a week    Frequency of Social Gatherings with Friends and Family: More than three times a week    Attends Religious Services: More than 4 times per year    Active Member of [employer] or Organizations: Yes    Attends Banker Meetings: 1 to 4 times per year    Marital Status: Married  Catering manager Violence: Not At Risk (08/23/2022)   Humiliation, Afraid, Rape, and Kick questionnaire    Fear of Current or Ex-Partner: No    Emotionally Abused: No    Physically Abused: No    Sexually Abused: No    Family History  Problem Relation Age of Onset   Hypertension Mother    Hyperlipidemia Mother    Hypertension Father    Lupus Father        skin   Sarcoidosis Sister    Kidney failure Daughter    Sickle cell anemia Daughter    Sickle cell trait Daughter    Sickle cell anemia Son    Hypertension Other    Anesthesia problems Neg Hx    Hypotension Neg Hx    Malignant hyperthermia Neg Hx    Pseudochol deficiency Neg Hx    Cancer - Colon Neg Hx        Objective: Vitals:    04/25/23 0912  BP: 124/64  Pulse: 80     Physical Exam Genitourinary:    Comments: There are small area bilaterally lateral to the posterior labia majora that are consistent with hydradenitis.  It is not currently infected.     Lab Results:  PSA No results found for: "PSA" No results found for: "TESTOSTERONE" Recent Results (from the past 2160 hours)  POCT Urinalysis Dipstick     Status: None   Collection Time: 02/06/23  2:49 PM  Result Value Ref Range   Color, UA     Clarity, UA     Glucose, UA Negative Negative   Bilirubin, UA     Ketones, UA neg    Spec Grav, UA     Blood, UA 2+    pH, UA     Protein, UA Negative Negative   Urobilinogen, UA     Nitrite, UA neg    Leukocytes, UA Negative Negative   Appearance     Odor    Urine Culture     Status: None   Collection Time: 02/06/23  3:00 PM   Specimen: Urine   UR  Result Value Ref Range   Urine Culture, Routine Final report    Organism ID, Bacteria Comment     Comment: Culture shows less than 10,000 colony forming units of bacteria per milliliter of urine. This colony count is not generally considered to be clinically significant.   Urinalysis, Routine w reflex microscopic     Status: Abnormal   Collection Time: 02/06/23  3:00 PM  Result Value Ref Range   Specific Gravity, UA 1.016 1.005 - 1.030   pH, UA 5.5 5.0 - 7.5   Color, UA Yellow Yellow   Appearance Ur Clear Clear   Leukocytes,UA Negative Negative   Protein,UA Negative Negative/Trace   Glucose, UA Negative Negative   Ketones, UA Negative Negative   RBC, UA 1+ (A) Negative   Bilirubin, UA Negative Negative   Urobilinogen, Ur 0.2 0.2 - 1.0 mg/dL  Nitrite, UA Negative Negative   Microscopic Examination See below:     Comment: Microscopic was indicated and was performed.  Microscopic Examination     Status: Abnormal   Collection Time: 02/06/23  3:00 PM  Result Value Ref Range   WBC, UA None seen 0 - 5 /hpf   RBC, Urine 3-10 (A) 0 - 2 /hpf    Epithelial Cells (non renal) 0-10 0 - 10 /hpf   Casts None seen None seen /lpf   Bacteria, UA None seen None seen/Few  Lactate dehydrogenase (LDH)     Status: None   Collection Time: 03/06/23 11:17 AM  Result Value Ref Range   LDH 136 98 - 192 U/L    Comment: Performed at [facility], [address], [place], [place] [address]  CBC with Differential (Cancer Center Only)     Status: None   Collection Time: 03/06/23 11:17 AM  Result Value Ref Range   WBC Count 7.5 4.0 - 10.5 K/uL   RBC 4.28 3.87 - 5.11 MIL/uL   Hemoglobin 12.2 12.0 - 15.0 g/dL   HCT 95.6 38.7 - 56.4 %   MCV 85.3 80.0 - 100.0 fL   MCH 28.5 26.0 - 34.0 pg   MCHC 33.4 30.0 - 36.0 g/dL   RDW 33.2 95.1 - 88.4 %   Platelet Count 348 150 - 400 K/uL   nRBC 0.0 0.0 - 0.2 %   Neutrophils Relative % 61 %   Neutro Abs 4.6 1.7 - 7.7 K/uL   Lymphocytes Relative 32 %   Lymphs Abs 2.4 0.7 - 4.0 K/uL   Monocytes Relative 6 %   Monocytes Absolute 0.4 0.1 - 1.0 K/uL   Eosinophils Relative 1 %   Eosinophils Absolute 0.1 0.0 - 0.5 K/uL   Basophils Relative 0 %   Basophils Absolute 0.0 0.0 - 0.1 K/uL   Immature Granulocytes 0 %   Abs Immature Granulocytes 0.02 0.00 - 0.07 K/uL    Comment: Performed at [facility], [address], [place], [place] [address]  CMP (Cancer Center only)     Status: Abnormal   Collection Time: 03/06/23 11:17 AM  Result Value Ref Range   Sodium 139 135 - 145 mmol/L   Potassium 3.8 3.5 - 5.1 mmol/L   Chloride 104 98 - 111 mmol/L   CO2 32 22 - 32 mmol/L   Glucose, Bld 86 70 - 99 mg/dL    Comment: Glucose reference range applies only to samples taken after fasting for at least 8 hours.   BUN 11 8 - 23 mg/dL   Creatinine 3.01 6.01 - 1.00 mg/dL   Calcium 9.9 8.9 - 09.3 mg/dL   Total Protein 6.4 (L) 6.5 - 8.1 g/dL   Albumin 4.0 3.5 - 5.0 g/dL   AST 17 15 - 41 U/L   ALT 13 0 - 44 U/L   Alkaline Phosphatase 73 38 - 126 U/L   Total Bilirubin 0.3 <1.2  mg/dL   GFR, Estimated >23 >55 mL/min    Comment: (NOTE) Calculated using the CKD-EPI Creatinine Equation (2021)    Anion gap 3 (L) 5 - 15    Comment: Performed at [facility], [address], [place], [place] [address]  Urinalysis, Routine w reflex microscopic     Status: Abnormal   Collection Time: 03/08/23 11:45 AM  Result Value Ref Range   Specific Gravity, UA 1.010 1.005 - 1.030   pH, UA 6.0 5.0 -  7.5   Color, UA Yellow Yellow   Appearance Ur Clear Clear   Leukocytes,UA Negative Negative   Protein,UA Negative Negative/Trace   Glucose, UA Negative Negative   Ketones, UA Negative Negative   RBC, UA 2+ (A) Negative   Bilirubin, UA Negative Negative   Urobilinogen, Ur 0.2 0.2 - 1.0 mg/dL   Nitrite, UA Negative Negative   Microscopic Examination See below:   Microscopic Examination     Status: Abnormal   Collection Time: 03/08/23 11:45 AM   Urine  Result Value Ref Range   WBC, UA 0-5 0 - 5 /hpf   RBC, Urine 3-10 (A) 0 - 2 /hpf   Epithelial Cells (non renal) 0-10 0 - 10 /hpf   Bacteria, UA None seen None seen/Few  I-STAT creatinine     Status: None   Collection Time: 04/04/23  6:19 PM  Result Value Ref Range   Creatinine, Ser 0.80 0.44 - 1.00 mg/dL  Urinalysis, Routine w reflex microscopic     Status: Abnormal   Collection Time: 04/25/23  9:12 AM  Result Value Ref Range   Specific Gravity, UA 1.015 1.005 - 1.030   pH, UA 6.0 5.0 - 7.5   Color, UA Yellow Yellow   Appearance Ur Clear Clear   Leukocytes,UA Negative Negative   Protein,UA Negative Negative/Trace   Glucose, UA Negative Negative   Ketones, UA Negative Negative   RBC, UA 2+ (A) Negative   Bilirubin, UA Negative Negative   Urobilinogen, Ur 0.2 0.2 - 1.0 mg/dL   Nitrite, UA Negative Negative   Microscopic Examination See below:     Comment: Microscopic was indicated and was performed.  Microscopic Examination     Status: Abnormal   Collection Time: 04/25/23  9:12 AM   Urine   Result Value Ref Range   WBC, UA 0-5 0 - 5 /hpf   RBC, Urine 11-30 (A) 0 - 2 /hpf   Epithelial Cells (non renal) 0-10 0 - 10 /hpf   Bacteria, UA None seen None seen/Few    UA has 11-30 RBC's.  Studies/Results: No results found. Results for orders placed during the hospital encounter of 10/26/21  DG Abdomen 1 View  Narrative CLINICAL DATA:  Constipation  EXAM: ABDOMEN - 1 VIEW  COMPARISON:  CT 04/20/2021  FINDINGS: The bowel gas pattern is normal. No radio-opaque calculi or other significant radiographic abnormality are seen. Only mild stool in the colon. Phleboliths in the pelvis  IMPRESSION: Negative.   Electronically Signed By: Jasmine Pang M.D. On: 10/26/2021 18:11  No results found for this or any previous visit.  No results found for this or any previous visit.  No results found for this or any previous visit.  No results found for this or any previous visit.  No results found for this or any previous visit.  Results for orders placed during the hospital encounter of 04/04/23  CT HEMATURIA WORKUP  Narrative CLINICAL DATA:  Microscopic hematuria.  EXAM: CT ABDOMEN AND PELVIS WITHOUT AND WITH CONTRAST  TECHNIQUE: Multidetector CT imaging of the abdomen and pelvis was performed following the standard protocol before and following the bolus administration of intravenous contrast.  RADIATION DOSE REDUCTION: This exam was performed according to the departmental dose-optimization program which includes automated exposure control, adjustment of the mA and/or kV according to patient size and/or use of iterative reconstruction technique.  CONTRAST:  OMNIPAQUE IOHEXOL 300 MG/ML  SOLN  COMPARISON:  None Available.  FINDINGS: Lower Chest: No acute findings.  Hepatobiliary: No suspicious hepatic masses identified. Gallbladder is unremarkable. No evidence of biliary ductal dilatation.  Pancreas:  No mass or inflammatory changes.  Spleen:  Within normal limits in size and appearance.  Adrenals/Urinary Tract: No adrenal masses identified. No evidence of urolithiasis or hydronephrosis. A few benign-appearing renal cysts are noted bilaterally (No followup imaging is recommended). No suspicious renal masses identified. No masses seen involving the collecting systems, ureters, or bladder.  Stomach/Bowel: No evidence of obstruction, inflammatory process or abnormal fluid collections.  Vascular/Lymphatic: No pathologically enlarged lymph nodes. No acute vascular findings.  Reproductive:  No mass or other significant abnormality.  Other:  None.  Musculoskeletal:  No suspicious bone lesions identified.  IMPRESSION: Unremarkable exam. No radiographic evidence of urinary tract neoplasm, urolithiasis, or hydronephrosis.   Electronically Signed By: Danae Orleans M.D. On: 04/13/2023 10:11  No results found for this or any previous visit.  Procedure: Cystoscopy.  She was prepped with betadine and the flexible scope was passed.  The urethra is normal.  The bladder wall had mild trabeculation with a punctate hemorrhagic lesion on the right posterior wall but no tumors or stones are noted and the UO's are negative.   Assessment & Plan: Microhematuria.  CT and cystoscopy are negative. This is probably secondary to her Sickle Cell trait.   F/u in a year.  Hydradenitis supprativa of the labia.  I will send doxycycline for her to keep on hand.   Meds ordered this encounter  Medications   ciprofloxacin (CIPRO) tablet 500 mg     Orders Placed This Encounter  Procedures   Microscopic Examination   Urinalysis, Routine w reflex microscopic   Cystoscopy      Return in about 1 year (around 04/24/2024).   CC: Benetta Spar, MD      Bjorn Pippin 04/26/2023

## 2023-04-26 ENCOUNTER — Telehealth: Payer: Self-pay | Admitting: Urology

## 2023-04-26 ENCOUNTER — Other Ambulatory Visit: Payer: Self-pay | Admitting: Urology

## 2023-04-26 MED ORDER — DOXYCYCLINE HYCLATE 100 MG PO TABS: 100.0000 mg | ORAL_TABLET | Freq: Two times a day (BID) | ORAL | 3 refills | Status: DC

## 2023-04-26 NOTE — Telephone Encounter (Signed)
Says Dr Annabell Howells was supposed to send in an antibiotic for boils

## 2023-04-26 NOTE — Telephone Encounter (Signed)
Please advise

## 2023-04-26 NOTE — Telephone Encounter (Signed)
Calling let Pt know MD sent in her Rx

## 2023-05-06 ENCOUNTER — Encounter (HOSPITAL_COMMUNITY): Payer: Self-pay | Admitting: *Deleted

## 2023-05-06 ENCOUNTER — Emergency Department (HOSPITAL_COMMUNITY)
Admission: EM | Admit: 2023-05-06 | Discharge: 2023-05-06 | Disposition: A | Discharge status: Home / Self Care | Payer: Medicaid Other | Attending: Emergency Medicine | Admitting: Emergency Medicine

## 2023-05-06 ENCOUNTER — Other Ambulatory Visit: Payer: Self-pay

## 2023-05-06 DIAGNOSIS — E876 Hypokalemia: Secondary | ICD-10-CM | POA: Diagnosis not present

## 2023-05-06 DIAGNOSIS — R42 Dizziness and giddiness: Secondary | ICD-10-CM | POA: Insufficient documentation

## 2023-05-06 LAB — COMPREHENSIVE METABOLIC PANEL
ALT: 15 U/L (ref 0–44)
AST: 19 U/L (ref 15–41)
Albumin: 3.5 g/dL (ref 3.5–5.0)
Alkaline Phosphatase: 65 U/L (ref 38–126)
Anion gap: 11 (ref 5–15)
BUN: 11 mg/dL (ref 8–23)
CO2: 24 mmol/L (ref 22–32)
Calcium: 9.7 mg/dL (ref 8.9–10.3)
Chloride: 105 mmol/L (ref 98–111)
Creatinine, Ser: 0.68 mg/dL (ref 0.44–1.00)
GFR, Estimated: >60 mL/min (ref >60)
Glucose, Bld: 124 mg/dL — ABNORMAL HIGH (ref 70–99)
Potassium: 3.3 mmol/L — ABNORMAL LOW (ref 3.5–5.1)
Sodium: 140 mmol/L (ref 135–145)
Total Bilirubin: 0.5 mg/dL (ref 0.0–1.2)
Total Protein: 6.8 g/dL (ref 6.5–8.1)

## 2023-05-06 LAB — CBC WITH DIFFERENTIAL/PLATELET
Abs Immature Granulocytes: 0.01 10*3/uL (ref 0.00–0.07)
Basophils Absolute: 0 10*3/uL (ref 0.0–0.1)
Basophils Relative: 0 %
Eosinophils Absolute: 0.1 10*3/uL (ref 0.0–0.5)
Eosinophils Relative: 1 %
HCT: 35.1 % — ABNORMAL LOW (ref 36.0–46.0)
Hemoglobin: 11.9 g/dL — ABNORMAL LOW (ref 12.0–15.0)
Immature Granulocytes: 0 %
Lymphocytes Relative: 36 %
Lymphs Abs: 2.2 10*3/uL (ref 0.7–4.0)
MCH: 29 pg (ref 26.0–34.0)
MCHC: 33.9 g/dL (ref 30.0–36.0)
MCV: 85.6 fL (ref 80.0–100.0)
Monocytes Absolute: 0.4 10*3/uL (ref 0.1–1.0)
Monocytes Relative: 6 %
Neutro Abs: 3.4 10*3/uL (ref 1.7–7.7)
Neutrophils Relative %: 57 %
Platelets: 332 10*3/uL (ref 150–400)
RBC: 4.1 MIL/uL (ref 3.87–5.11)
RDW: 12.9 % (ref 11.5–15.5)
WBC: 6.1 10*3/uL (ref 4.0–10.5)
nRBC: 0 % (ref 0.0–0.2)

## 2023-05-06 MED ORDER — POTASSIUM CHLORIDE CRYS ER 20 MEQ PO TBCR
20.0000 meq | EXTENDED_RELEASE_TABLET | Freq: Once | ORAL | Status: AC
  Administered 2023-05-06: 20 meq via ORAL
  Filled 2023-05-06: qty 1

## 2023-05-06 NOTE — ED Triage Notes (Signed)
Pt c/o intermittent dizziness x 1 week. Hx of vertigo. Denies weakness.

## 2023-05-06 NOTE — ED Provider Notes (Signed)
[place] EMERGENCY DEPARTMENT AT [facility] Provider Note   CSN: [id number] Arrival date & time: 05/06/23  4098     History  Chief Complaint  Patient presents with   Dizziness    Amber Drake is a 63 y.o. female.  She has a history of lymphoma involving the meninges and follows with Dr. Candise Che.  She said for the last 3 days she has had a few episodes of dizziness.  She said it only happens when she moves quickly and causes her to want to be still for a few seconds until it passes.  She has had this before and was told it was vertigo.  She has tried nothing for it.  She has not experienced any episodes today but her son was here getting evaluated so she decided to come get it checked out.  No blurry vision double vision headache numbness weakness URI symptoms cough vomiting diarrhea.  No fevers or chills.  No change in her hearing.  Last MRI was little less than a year ago and with stable.  Last saw her oncologist back in December.  The history is provided by the patient.  Dizziness Quality:  Vertigo Severity:  Moderate Onset quality:  Gradual Duration:  3 days Timing:  Sporadic Progression:  Unchanged Chronicity:  Recurrent Relieved by:  Being still Worsened by:  Movement Associated symptoms: no chest pain, no headaches, no hearing loss, no nausea, no shortness of breath, no syncope, no tinnitus, no vision changes, no vomiting and no weakness   Risk factors: hx of vertigo        Home Medications Prior to Admission medications   Medication Sig Start Date End Date Taking? Authorizing Provider  atorvastatin (LIPITOR) 20 MG tablet Take 1 tablet (20 mg total) by mouth daily. 03/14/15   Jacquelin Hawking, PA-C  doxycycline (VIBRA-TABS) 100 MG tablet Take 1 tablet (100 mg total) by mouth 2 (two) times daily. 04/26/23   Bjorn Pippin, MD  gabapentin (NEURONTIN) 300 MG capsule Take 300 mg by mouth daily. 08/15/22   [provider]  lisinopril-hydrochlorothiazide  (PRINZIDE,ZESTORETIC) 20-12.5 MG tablet Take 1 tablet by mouth daily.    [provider]  meclizine (ANTIVERT) 25 MG tablet Take 1 tablet (25 mg total) by mouth 3 (three) times daily as needed for dizziness. 06/20/22   Prosperi, Christian H, PA-C  methocarbamol (ROBAXIN) 500 MG tablet Take 1 tablet (500 mg total) by mouth every 8 (eight) hours as needed for muscle spasms. 10/23/22   Benjiman Core, MD  Multiple Vitamin (MULTIVITAMIN WITH MINERALS) TABS tablet Take 1 tablet by mouth daily.    [provider]  ondansetron (ZOFRAN) 4 MG tablet Take 1 tablet (4 mg total) by mouth every 6 (six) hours. 07/17/22   Triplett, Tammy, PA-C  potassium chloride SA (KLOR-CON) 20 MEQ tablet TAKE (1) TABLET BY MOUTH TWICE DAILY. 01/31/21   Johney Maine, MD  silver sulfADIAZINE (SILVADENE) 1 % cream Apply 1 Application topically daily. 02/06/23   Adline Potter, NP      Allergies    Penicillins, Penicillins cross reactors, and Tape    Review of Systems   Review of Systems  Constitutional:  Negative for fever.  HENT:  Negative for hearing loss and tinnitus.   Eyes:  Negative for visual disturbance.  Respiratory:  Negative for shortness of breath.   Cardiovascular:  Negative for chest pain and syncope.  Gastrointestinal:  Negative for nausea and vomiting.  Genitourinary:  Positive for hematuria.  Neurological:  Positive for dizziness. Negative for weakness and headaches.    Physical Exam Updated Vital Signs BP (!) 147/72 (BP Location: Right Arm)   Pulse 83   Temp 98.1 F (36.7 C) (Oral)   Resp 18   Ht 5\' 8"  (1.727 m)   Wt 72.4 kg   LMP 07/18/2010   SpO2 96%   BMI 24.27 kg/m  Physical Exam Vitals and nursing note reviewed.  Constitutional:      General: She is not in acute distress.    Appearance: Normal appearance. She is well-developed.  HENT:     Head: Normocephalic and atraumatic.  Eyes:     Conjunctiva/sclera: Conjunctivae normal.     Comments: She has a  few beats of horizontal nystagmus  Cardiovascular:     Rate and Rhythm: Normal rate and regular rhythm.     Heart sounds: No murmur heard. Pulmonary:     Effort: Pulmonary effort is normal. No respiratory distress.     Breath sounds: Normal breath sounds.  Abdominal:     Palpations: Abdomen is soft.     Tenderness: There is no abdominal tenderness.  Musculoskeletal:        General: No swelling.     Cervical back: Neck supple.  Skin:    General: Skin is warm and dry.     Capillary Refill: Capillary refill takes less than 2 seconds.  Neurological:     General: No focal deficit present.     Mental Status: She is alert and oriented to person, place, and time.     Cranial Nerves: No cranial nerve deficit.     Sensory: No sensory deficit.     Motor: No weakness.     Gait: Gait normal.     ED Results / Procedures / Treatments   Labs (all labs ordered are listed, but only abnormal results are displayed) Labs Reviewed  COMPREHENSIVE METABOLIC PANEL - Abnormal; Notable for the following components:      Result Value   Potassium 3.3 (*)    Glucose, Bld 124 (*)    All other components within normal limits  CBC WITH DIFFERENTIAL/PLATELET - Abnormal; Notable for the following components:   Hemoglobin 11.9 (*)    HCT 35.1 (*)    All other components within normal limits    EKG EKG Interpretation Date/Time:  Monday May 06 2023 09:48:12 EST Ventricular Rate:  72 PR Interval:  181 QRS Duration:  86 QT Interval:  445 QTC Calculation: 487 R Axis:   66  Text Interpretation: Sinus rhythm Anteroseptal infarct, old No significant change since prior 7/24 Confirmed by Meridee Score [id number]) on 05/06/2023 9:50:24 AM  Radiology No results found.  Procedures Procedures    Medications Ordered in ED Medications - No data to display  ED Course/ Medical Decision Making/ A&P Clinical Course as of 05/06/23 1817  Mon May 06, 2023  1022 Patient's lab work fairly benign other than a  mildly low potassium.  She said she is on a potassium supplement already.  I offered her MRI as she has had history of CNS lymphoma.  She is declining doing this, she thinks it is her vertigo and she will follow-up with her oncologist to see if she needs any further imaging.  Return instructions discussed [MB]    Clinical Course User Index [MB] Terrilee Files, MD  Medical Decision Making Amount and/or Complexity of Data Reviewed Labs: ordered.  Risk Prescription drug management.   This patient complains of dizziness; this involves an extensive number of treatment Options and is a complaint that carries with it a high risk of complications and morbidity. The differential includes vertigo, dehydration, metabolic derangement, stroke, bleed, tumor  I ordered, reviewed and interpreted labs, which included CBC unremarkable chemistry with mildly low potassium I ordered medication oral potassium and reviewed PMP when indicated. Previous records obtained and reviewed in epic, patient seen about a year ago for dizziness had a negative MRI at that time Cardiac monitoring reviewed, sinus rhythm Social determinants considered, tobacco use Critical Interventions: None  After the interventions stated above, I reevaluated the patient and found completely asymptomatic at this time Admission and further testing considered, she was offered MRI to make sure she does not have any evidence of stroke or tumor recurrence.  She said she would rather be discharged and follow-up with her oncologist.  Return instructions discussed.         Final Clinical Impression(s) / ED Diagnoses Final diagnoses:  Dizziness  Hypokalemia    Rx / DC Orders ED Discharge Orders     None         Terrilee Files, MD 05/06/23 1819

## 2023-05-06 NOTE — Discharge Instructions (Signed)
You were seen in the emergency department for episodes of dizziness.  Your lab work showed your potassium to be mildly low.  We discussed an MRI of your brain and you felt you would rather follow-up with your oncologist.  Continue your regular medications and follow-up with Dr. Candise Che.  Return if any worsening or concerning symptoms

## 2023-05-19 ENCOUNTER — Emergency Department (HOSPITAL_COMMUNITY)
Admission: EM | Admit: 2023-05-19 | Discharge: 2023-05-19 | Disposition: A | Discharge status: Home / Self Care | Payer: Medicaid Other | Attending: Emergency Medicine | Admitting: Emergency Medicine

## 2023-05-19 ENCOUNTER — Emergency Department (HOSPITAL_COMMUNITY): Payer: Medicaid Other

## 2023-05-19 ENCOUNTER — Other Ambulatory Visit: Payer: Self-pay

## 2023-05-19 ENCOUNTER — Encounter (HOSPITAL_COMMUNITY): Payer: Self-pay | Admitting: *Deleted

## 2023-05-19 DIAGNOSIS — Z79899 Other long term (current) drug therapy: Secondary | ICD-10-CM | POA: Insufficient documentation

## 2023-05-19 DIAGNOSIS — R42 Dizziness and giddiness: Secondary | ICD-10-CM | POA: Diagnosis present

## 2023-05-19 DIAGNOSIS — I10 Essential (primary) hypertension: Secondary | ICD-10-CM | POA: Diagnosis not present

## 2023-05-19 LAB — URINALYSIS, ROUTINE W REFLEX MICROSCOPIC
Bacteria, UA: NONE SEEN
Bilirubin Urine: NEGATIVE
Glucose, UA: NEGATIVE mg/dL
Ketones, ur: NEGATIVE mg/dL
Leukocytes,Ua: NEGATIVE
Nitrite: NEGATIVE
Protein, ur: NEGATIVE mg/dL
Specific Gravity, Urine: 1.004 — ABNORMAL LOW (ref 1.005–1.030)
pH: 6 (ref 5.0–8.0)

## 2023-05-19 LAB — BASIC METABOLIC PANEL
Anion gap: 9 (ref 5–15)
BUN: 9 mg/dL (ref 8–23)
CO2: 27 mmol/L (ref 22–32)
Calcium: 9.6 mg/dL (ref 8.9–10.3)
Chloride: 103 mmol/L (ref 98–111)
Creatinine, Ser: 0.69 mg/dL (ref 0.44–1.00)
GFR, Estimated: >60 mL/min (ref >60)
Glucose, Bld: 98 mg/dL (ref 70–99)
Potassium: 3.7 mmol/L (ref 3.5–5.1)
Sodium: 139 mmol/L (ref 135–145)

## 2023-05-19 LAB — CBC
HCT: 36 % (ref 36.0–46.0)
Hemoglobin: 12.1 g/dL (ref 12.0–15.0)
MCH: 28.8 pg (ref 26.0–34.0)
MCHC: 33.6 g/dL (ref 30.0–36.0)
MCV: 85.7 fL (ref 80.0–100.0)
Platelets: 430 10*3/uL — ABNORMAL HIGH (ref 150–400)
RBC: 4.2 MIL/uL (ref 3.87–5.11)
RDW: 12.7 % (ref 11.5–15.5)
WBC: 8 10*3/uL (ref 4.0–10.5)
nRBC: 0 % (ref 0.0–0.2)

## 2023-05-19 MED ORDER — MECLIZINE HCL 12.5 MG PO TABS
12.5000 mg | ORAL_TABLET | Freq: Once | ORAL | Status: AC
  Administered 2023-05-19: 12.5 mg via ORAL
  Filled 2023-05-19: qty 1

## 2023-05-19 MED ORDER — MECLIZINE HCL 25 MG PO TABS: 25.0000 mg | ORAL_TABLET | Freq: Three times a day (TID) | ORAL | 0 refills | Status: DC | PRN

## 2023-05-19 MED ORDER — GADOBUTROL 1 MMOL/ML IV SOLN
7.0000 mL | Freq: Once | INTRAVENOUS | Status: AC | PRN
  Administered 2023-05-19: 7 mL via INTRAVENOUS

## 2023-05-19 MED ORDER — SODIUM CHLORIDE 0.9 % IV BOLUS
1000.0000 mL | Freq: Once | INTRAVENOUS | Status: AC
  Administered 2023-05-19: 1000 mL via INTRAVENOUS

## 2023-05-19 NOTE — ED Provider Notes (Signed)
[place] EMERGENCY DEPARTMENT AT [facility] Provider Note   CSN: [id number] Arrival date & time: 05/19/23  1055     History  Chief Complaint  Patient presents with   Dizziness    Amber Drake is a 63 y.o. female with medical history of anemia, arthritis, blood dyscrasia, lymphoma, hypertension, brain tumor.  The patient presents to the ED for evaluation of dizziness.  Patient was seen here on 2/3 for same complaint.  At that visit, the patient was offered MRI brain however she deferred on this at this time.  The patient at that visit stated that she had been experiencing episodic instances of dizziness that she describes as very fleeting, lasting less than 2 minutes.  She reports these episodes typically occur when she goes from a sitting to a standing position.  She states that she has had these episodes before in the past and been told that she has vertigo.  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  Reports that earlier today she experienced dizziness that lasted for less than 2 minutes and then resolved.  States that it occurred when she went from sitting to standing position.  Denies any one-sided weakness or numbness, blurred vision, slurred speech, facial droop, double vision, word finding difficulty.  Denies any current dizziness.  Denies chest pain, shortness of breath, nausea, vomiting, abdominal pain.  Denies neck pain or headache.  Also states that she has had some blood in her urine recently, was seen by urology where she had a cystoscopy done which was unremarkable and advised to follow-up in 1 year.   Dizziness Associated symptoms: no weakness        Home Medications Prior to Admission medications   Medication Sig Start Date End Date Taking? Authorizing Provider  atorvastatin (LIPITOR) 20 MG tablet Take 1 tablet (20 mg total) by mouth daily. 03/14/15   Jacquelin Hawking, PA-C  doxycycline (VIBRA-TABS) 100 MG tablet Take 1 tablet (100 mg total) by mouth 2  (two) times daily. 04/26/23   Bjorn Pippin, MD  gabapentin (NEURONTIN) 300 MG capsule Take 300 mg by mouth daily. 08/15/22   [provider]  lisinopril-hydrochlorothiazide (PRINZIDE,ZESTORETIC) 20-12.5 MG tablet Take 1 tablet by mouth daily.    [provider]  meclizine (ANTIVERT) 25 MG tablet Take 1 tablet (25 mg total) by mouth 3 (three) times daily as needed for dizziness. 05/19/23  Yes Al Decant, PA-C  methocarbamol (ROBAXIN) 500 MG tablet Take 1 tablet (500 mg total) by mouth every 8 (eight) hours as needed for muscle spasms. 10/23/22   Benjiman Core, MD  Multiple Vitamin (MULTIVITAMIN WITH MINERALS) TABS tablet Take 1 tablet by mouth daily.    [provider]  ondansetron (ZOFRAN) 4 MG tablet Take 1 tablet (4 mg total) by mouth every 6 (six) hours. 07/17/22   Triplett, Tammy, PA-C  potassium chloride SA (KLOR-CON) 20 MEQ tablet TAKE (1) TABLET BY MOUTH TWICE DAILY. 01/31/21   Johney Maine, MD  silver sulfADIAZINE (SILVADENE) 1 % cream Apply 1 Application topically daily. 02/06/23   Adline Potter, NP      Allergies    Penicillins, Penicillins cross reactors, and Tape    Review of Systems   Review of Systems  Neurological:  Positive for dizziness. Negative for facial asymmetry, speech difficulty, weakness and numbness.  All other systems reviewed and are negative.   Physical Exam Updated Vital Signs BP 108/74   Pulse (!) 103   Temp 97.6 F (  36.4 C) (Temporal)   Resp 18   Ht 5\' 6"  (1.676 m)   Wt 71.7 kg   LMP 07/18/2010   SpO2 98%   BMI 25.50 kg/m  Physical Exam Vitals and nursing note reviewed.  Constitutional:      General: She is not in acute distress.    Appearance: Normal appearance. She is well-developed. She is not ill-appearing, toxic-appearing or diaphoretic.  HENT:     Head: Normocephalic and atraumatic.  Eyes:     Conjunctiva/sclera: Conjunctivae normal.  Cardiovascular:     Rate and Rhythm: Normal rate and  regular rhythm.     Heart sounds: No murmur heard. Pulmonary:     Effort: Pulmonary effort is normal. No respiratory distress.     Breath sounds: Normal breath sounds.  Abdominal:     Palpations: Abdomen is soft.     Tenderness: There is no abdominal tenderness.  Musculoskeletal:        General: No swelling.     Cervical back: Neck supple.  Skin:    General: Skin is warm and dry.     Capillary Refill: Capillary refill takes less than 2 seconds.  Neurological:     General: No focal deficit present.     Mental Status: She is alert and oriented to person, place, and time.     GCS: GCS eye subscore is 4. GCS verbal subscore is 5. GCS motor subscore is 6.     Cranial Nerves: Cranial nerves 2-12 are intact. No cranial nerve deficit.     Sensory: Sensation is intact. No sensory deficit.     Motor: Motor function is intact. No weakness.     Coordination: Coordination is intact. Heel to [facility] Test normal.     Comments: CN III through XII intact.  Intact finger-nose, heel-to-shin.  No pronator drift, no slurred speech or facial droop.  Equal grip strength upper extremities bilaterally.  Equal strength bilateral lower extremities 5 out of 5.  Psychiatric:        Mood and Affect: Mood normal.     ED Results / Procedures / Treatments   Labs (all labs ordered are listed, but only abnormal results are displayed) Labs Reviewed  CBC - Abnormal; Notable for the following components:      Result Value   Platelets 430 (*)    All other components within normal limits  URINALYSIS, ROUTINE W REFLEX MICROSCOPIC - Abnormal; Notable for the following components:   Color, Urine STRAW (*)    Specific Gravity, Urine 1.004 (*)    Hgb urine dipstick MODERATE (*)    All other components within normal limits  BASIC METABOLIC PANEL    EKG None  Radiology MR Brain W and Wo Contrast Result Date: 05/19/2023 CLINICAL DATA:  dizziness, third visit for same EXAM: MRI HEAD WITHOUT AND WITH CONTRAST TECHNIQUE:  Multiplanar, multiecho pulse sequences of the brain and surrounding structures were obtained without and with intravenous contrast. CONTRAST:  7mL GADAVIST GADOBUTROL 1 MMOL/ML IV SOLN COMPARISON:  MRI march 20, 24. FINDINGS: Brain: No acute infarction, acute hemorrhage, hydrocephalus, or extra-axial collection. 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 Mild T2/FLAIR hyperintensities in the white matter, compatible with chronic microvascular disease. Similar mild encephalomalacia in the right lateral cerebellum status post occipital craniotomy for tumor resection. Small remote left basal ganglia lacunar infarct. Vascular: Major arterial flow voids are maintained at the skull base. Skull and upper cervical spine: Normal marrow signal. Right  occipital craniotomy. Sinuses/Orbits: Clear sinuses.  No acute orbital findings. Other: No mastoid effusions. IMPRESSION: 1. No acute abnormality.  Stable brain MRI. 2. Unchanged 5 mm colloid cyst. 3. Stable right posterior fossa postop changes without worrisome enhancement. Electronically Signed   By: Feliberto Harts M.D.   On: 05/19/2023 16:08    Procedures Procedures   Medications Ordered in ED Medications  meclizine (ANTIVERT) tablet 12.5 mg (has no administration in time range)  sodium chloride 0.9 % bolus 1,000 mL (0 mLs Intravenous Stopped 05/19/23 1510)  gadobutrol (GADAVIST) 1 MMOL/ML injection 7 mL (7 mLs Intravenous Contrast Given 05/19/23 1452)    ED Course/ Medical Decision Making/ A&P  Medical Decision Making Amount and/or Complexity of Data Reviewed Labs: ordered. Radiology: ordered.    63 year old female presents for evaluation.  Please see HPI for further details.  On examination the patient is afebrile and nontachycardic.  Her lung sounds are clear bilaterally, she is not hypoxic.  Abdomen is soft and compressible throughout with no tenderness noted.  Neurological examinations at baseline.   No deficits noted.  Intact finger-nose and heel-to-shin.  Suspect patient has vertigo.  Will assess with CBC, BMP, urinalysis that she reports she has blood in her urine but denies dysuria.  Will also assess with MRI of her brain as this is the patient's second visit for the same complaint and she has a history of a "brain tumor" as noted in her chart.  Patient CBC without leukocytosis or anemia.  BMP without electrolyte derangement.  Urinalysis shows moderate hemoglobin with patient denies dysuria, has had cystoscopy recently.  She will follow-up with urology regarding this.  MRI patient brain shows no acute abnormality.  There is a stable 5 mm colloid cyst without any acute changes.  Stable right posterior fossa postop changes without worrisome enhancement.  I agree with radiologist to rotation.  Patient was assessed with orthostatic vital signs.  She is not orthostatic.  Provided with some fluid and reports she does not have any dizziness presently.  She is requesting to receive meclizine prior to discharge so this was provided to her.  Will send her prescription to her pharmacy.  Have advised her to follow-up with her PCP.  Stable to discharge home.   Final Clinical Impression(s) / ED Diagnoses Final diagnoses:  Dizziness    Rx / DC Orders ED Discharge Orders          Ordered    meclizine (ANTIVERT) 25 MG tablet  3 times daily PRN        05/19/23 1634              Al Decant, PA-C 05/19/23 1635    Bethann Berkshire, MD 05/20/23 1149

## 2023-05-19 NOTE — Discharge Instructions (Signed)
It was a pleasure taking part in your care.  As we discussed, you work was reassuring.  MRI of your brain showed no acute changes.  Please take meclizine 3 times a day as needed for dizziness.  Please continue to hydrate yourself with fluids at home.  Please follow-up with your PCP.  Return to the ED with any new or worsening symptoms.

## 2023-05-19 NOTE — ED Notes (Signed)
Patient discharged. Provider spoke to patient. Paperwork given to patient and reviewed. Pt verbalized understanding. VSS. A+Ox4. Patient ambulated out of the ER with steady, independent gait. IV removed intact without any complications.

## 2023-05-19 NOTE — ED Triage Notes (Signed)
Pt seen for dizziness on 2/3 and was told it was vertigo.  Denies any HA's or one sided weakness or numbness.  Pt states dizziness is intermittent.

## 2023-07-09 ENCOUNTER — Emergency Department (HOSPITAL_COMMUNITY)

## 2023-07-09 ENCOUNTER — Encounter (HOSPITAL_COMMUNITY): Payer: Self-pay

## 2023-07-09 ENCOUNTER — Other Ambulatory Visit: Payer: Self-pay

## 2023-07-09 ENCOUNTER — Emergency Department (HOSPITAL_COMMUNITY)
Admission: EM | Admit: 2023-07-09 | Discharge: 2023-07-09 | Disposition: A | Discharge status: Home / Self Care | Attending: Emergency Medicine | Admitting: Emergency Medicine

## 2023-07-09 DIAGNOSIS — R0789 Other chest pain: Secondary | ICD-10-CM | POA: Insufficient documentation

## 2023-07-09 DIAGNOSIS — I10 Essential (primary) hypertension: Secondary | ICD-10-CM | POA: Insufficient documentation

## 2023-07-09 DIAGNOSIS — R079 Chest pain, unspecified: Secondary | ICD-10-CM

## 2023-07-09 DIAGNOSIS — Z79899 Other long term (current) drug therapy: Secondary | ICD-10-CM | POA: Insufficient documentation

## 2023-07-09 LAB — CBC WITH DIFFERENTIAL/PLATELET
Abs Immature Granulocytes: 0.02 10*3/uL (ref 0.00–0.07)
Basophils Absolute: 0 10*3/uL (ref 0.0–0.1)
Basophils Relative: 0 %
Eosinophils Absolute: 0.1 10*3/uL (ref 0.0–0.5)
Eosinophils Relative: 1 %
HCT: 37.5 % (ref 36.0–46.0)
Hemoglobin: 12.3 g/dL (ref 12.0–15.0)
Immature Granulocytes: 0 %
Lymphocytes Relative: 33 %
Lymphs Abs: 2.6 10*3/uL (ref 0.7–4.0)
MCH: 28.4 pg (ref 26.0–34.0)
MCHC: 32.8 g/dL (ref 30.0–36.0)
MCV: 86.6 fL (ref 80.0–100.0)
Monocytes Absolute: 0.5 10*3/uL (ref 0.1–1.0)
Monocytes Relative: 6 %
Neutro Abs: 4.7 10*3/uL (ref 1.7–7.7)
Neutrophils Relative %: 60 %
Platelets: 346 10*3/uL (ref 150–400)
RBC: 4.33 MIL/uL (ref 3.87–5.11)
RDW: 13.4 % (ref 11.5–15.5)
WBC: 7.9 10*3/uL (ref 4.0–10.5)
nRBC: 0 % (ref 0.0–0.2)

## 2023-07-09 LAB — COMPREHENSIVE METABOLIC PANEL WITH GFR
ALT: 17 U/L (ref 0–44)
AST: 18 U/L (ref 15–41)
Albumin: 3.9 g/dL (ref 3.5–5.0)
Alkaline Phosphatase: 71 U/L (ref 38–126)
Anion gap: 10 (ref 5–15)
BUN: 14 mg/dL (ref 8–23)
CO2: 28 mmol/L (ref 22–32)
Calcium: 10 mg/dL (ref 8.9–10.3)
Chloride: 103 mmol/L (ref 98–111)
Creatinine, Ser: 0.67 mg/dL (ref 0.44–1.00)
GFR, Estimated: >60 mL/min (ref >60)
Glucose, Bld: 89 mg/dL (ref 70–99)
Potassium: 3.9 mmol/L (ref 3.5–5.1)
Sodium: 141 mmol/L (ref 135–145)
Total Bilirubin: 0.5 mg/dL (ref 0.0–1.2)
Total Protein: 7.2 g/dL (ref 6.5–8.1)

## 2023-07-09 LAB — TROPONIN I (HIGH SENSITIVITY)
Troponin I (High Sensitivity): 3 ng/L (ref <18)
Troponin I (High Sensitivity): 3 ng/L (ref <18)

## 2023-07-09 LAB — D-DIMER, QUANTITATIVE: D-Dimer, Quant: 0.29 ug{FEU}/mL (ref 0.00–0.50)

## 2023-07-09 NOTE — Discharge Instructions (Signed)
 Test results today are reassuring.  Take ibuprofen and Tylenol as needed for pain.  Return to the emergency department for any new or worsening symptoms of concern.
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
## 2023-07-09 NOTE — ED Notes (Signed)
 Pt reports pain is present when she presses on her sternal chest area.

## 2023-07-11 ENCOUNTER — Telehealth: Payer: Self-pay

## 2023-07-11 MED ORDER — SILVER SULFADIAZINE 1 % EX CREA: 1.0000 | TOPICAL_CREAM | Freq: Every day | CUTANEOUS | 1 refills | Status: DC

## 2023-07-11 MED ORDER — DOXYCYCLINE HYCLATE 100 MG PO TABS
100.0000 mg | ORAL_TABLET | Freq: Two times a day (BID) | ORAL | 3 refills | Status: DC
Start: 2023-07-11 — End: 2023-08-17

## 2023-07-11 NOTE — Telephone Encounter (Signed)
 Patient would like for a nurse to call her, she wants medication called in for a boil.

## 2023-07-11 NOTE — Telephone Encounter (Signed)
 Has boil, will refill silvadene and doxycycline, has recurrent boils

## 2023-07-22 ENCOUNTER — Emergency Department (HOSPITAL_COMMUNITY)
Admission: EM | Admit: 2023-07-22 | Discharge: 2023-07-22 | Disposition: A | Discharge status: Home / Self Care | Attending: Emergency Medicine | Admitting: Emergency Medicine

## 2023-07-22 ENCOUNTER — Other Ambulatory Visit: Payer: Self-pay

## 2023-07-22 ENCOUNTER — Encounter (HOSPITAL_COMMUNITY): Payer: Self-pay | Admitting: Emergency Medicine

## 2023-07-22 ENCOUNTER — Emergency Department (HOSPITAL_COMMUNITY)

## 2023-07-22 DIAGNOSIS — M25511 Pain in right shoulder: Secondary | ICD-10-CM | POA: Diagnosis present

## 2023-07-22 DIAGNOSIS — R0789 Other chest pain: Secondary | ICD-10-CM | POA: Diagnosis not present

## 2023-07-22 LAB — COMPREHENSIVE METABOLIC PANEL WITH GFR
ALT: 11 U/L (ref 0–44)
AST: 16 U/L (ref 15–41)
Albumin: 3.4 g/dL — ABNORMAL LOW (ref 3.5–5.0)
Alkaline Phosphatase: 70 U/L (ref 38–126)
Anion gap: 8 (ref 5–15)
BUN: 13 mg/dL (ref 8–23)
CO2: 27 mmol/L (ref 22–32)
Calcium: 9.5 mg/dL (ref 8.9–10.3)
Chloride: 104 mmol/L (ref 98–111)
Creatinine, Ser: 0.69 mg/dL (ref 0.44–1.00)
GFR, Estimated: >60 mL/min (ref >60)
Glucose, Bld: 93 mg/dL (ref 70–99)
Potassium: 3.7 mmol/L (ref 3.5–5.1)
Sodium: 139 mmol/L (ref 135–145)
Total Bilirubin: 0.6 mg/dL (ref 0.0–1.2)
Total Protein: 6.5 g/dL (ref 6.5–8.1)

## 2023-07-22 LAB — CBC WITH DIFFERENTIAL/PLATELET
Abs Immature Granulocytes: 0.02 10*3/uL (ref 0.00–0.07)
Basophils Absolute: 0 10*3/uL (ref 0.0–0.1)
Basophils Relative: 0 %
Eosinophils Absolute: 0.1 10*3/uL (ref 0.0–0.5)
Eosinophils Relative: 1 %
HCT: 34 % — ABNORMAL LOW (ref 36.0–46.0)
Hemoglobin: 11.1 g/dL — ABNORMAL LOW (ref 12.0–15.0)
Immature Granulocytes: 0 %
Lymphocytes Relative: 30 %
Lymphs Abs: 2.2 10*3/uL (ref 0.7–4.0)
MCH: 28.1 pg (ref 26.0–34.0)
MCHC: 32.6 g/dL (ref 30.0–36.0)
MCV: 86.1 fL (ref 80.0–100.0)
Monocytes Absolute: 0.5 10*3/uL (ref 0.1–1.0)
Monocytes Relative: 6 %
Neutro Abs: 4.5 10*3/uL (ref 1.7–7.7)
Neutrophils Relative %: 63 %
Platelets: 351 10*3/uL (ref 150–400)
RBC: 3.95 MIL/uL (ref 3.87–5.11)
RDW: 13.4 % (ref 11.5–15.5)
WBC: 7.2 10*3/uL (ref 4.0–10.5)
nRBC: 0 % (ref 0.0–0.2)

## 2023-07-22 LAB — TROPONIN I (HIGH SENSITIVITY): Troponin I (High Sensitivity): 3 ng/L (ref <18)

## 2023-07-22 MED ORDER — OXYCODONE-ACETAMINOPHEN 5-325 MG PO TABS
1.0000 | ORAL_TABLET | Freq: Once | ORAL | Status: AC
  Administered 2023-07-22: 1 via ORAL
  Filled 2023-07-22: qty 1

## 2023-07-22 MED ORDER — NAPROXEN 500 MG PO TABS: 500.0000 mg | ORAL_TABLET | Freq: Two times a day (BID) | ORAL | 0 refills | Status: DC

## 2023-07-22 MED ORDER — KETOROLAC TROMETHAMINE 15 MG/ML IJ SOLN
15.0000 mg | Freq: Once | INTRAMUSCULAR | Status: AC
  Administered 2023-07-22: 15 mg via INTRAVENOUS
  Filled 2023-07-22: qty 1

## 2023-07-22 MED ORDER — IOHEXOL 350 MG/ML SOLN
75.0000 mL | Freq: Once | INTRAVENOUS | Status: AC | PRN
  Administered 2023-07-22: 75 mL via INTRAVENOUS

## 2023-07-22 NOTE — ED Notes (Signed)
 Pt given a sandwich bag and drink at this time per EDP approval.

## 2023-07-22 NOTE — ED Triage Notes (Signed)
 Pt presents with right shoulder pain x 2 days, history rotor cuff surgery 2 years ago.

## 2023-07-22 NOTE — ED Notes (Signed)
CT at bedside 

## 2023-07-22 NOTE — Discharge Instructions (Signed)
 Please follow-up closely with your primary care doctor on an outpatient basis for reevaluation.  Return to emergency department immediately for any new or worsening symptoms.

## 2023-07-22 NOTE — ED Notes (Signed)
 Patient transported to CT

## 2023-07-22 NOTE — ED Provider Notes (Signed)
 [place] EMERGENCY DEPARTMENT AT [facility] Provider Note   CSN: [id number] Arrival date & time: 07/22/23  0865     History  Chief Complaint  Patient presents with   Shoulder Pain    Right    Amber Drake is a 63 y.o. female.  Patient is a 63 year old female who presents emergency department the chief complaint of right shoulder pain and right sided chest pain which has been ongoing for approximate the past 2 days.  She was evaluated in the emergency department approximately 2 weeks ago secondary to chest pain during which time she had a negative workup and was discharged home.  Patient notes that she does have a history of rotator cuff surgery on the right shoulder.  She denies any associated numbness or paresthesias.  She denies any recent falls or blunt trauma.  She does note that the pain in her chest is worse with cough and deep inspiration.   Shoulder Pain      Home Medications Prior to Admission medications   Medication Sig Start Date End Date Taking? Authorizing Provider  atorvastatin  (LIPITOR) 20 MG tablet Take 1 tablet (20 mg total) by mouth daily. 03/14/15   [name], PA-C  doxycycline  (VIBRA -TABS) 100 MG tablet Take 1 tablet (100 mg total) by mouth 2 (two) times daily. 07/11/23   [name], NP  gabapentin (NEURONTIN) 300 MG capsule Take 300 mg by mouth daily. 08/15/22   [provider]  lisinopril -hydrochlorothiazide  (PRINZIDE ,ZESTORETIC ) 20-12.5 MG tablet Take 1 tablet by mouth daily.    [provider]  meclizine  (ANTIVERT ) 25 MG tablet Take 1 tablet (25 mg total) by mouth 3 (three) times daily as needed for dizziness. 05/19/23   [name], PA-C  methocarbamol  (ROBAXIN ) 500 MG tablet Take 1 tablet (500 mg total) by mouth every 8 (eight) hours as needed for muscle spasms. 10/23/22   [name], MD  Multiple Vitamin (MULTIVITAMIN WITH MINERALS) TABS tablet Take 1 tablet by mouth daily.    [provider]  ondansetron  (ZOFRAN ) 4 MG tablet Take 1 tablet (4 mg total) by mouth every 6 (six) hours. 07/17/22   Triplett, Tammy, PA-C  potassium chloride  SA (KLOR-CON ) 20 MEQ tablet TAKE (1) TABLET BY MOUTH TWICE DAILY. 01/31/21   [name], MD  silver  sulfADIAZINE  (SILVADENE ) 1 % cream Apply 1 Application topically daily. 07/11/23   [name], NP      Allergies    Penicillins, Penicillins cross reactors, and Tape    Review of Systems   Review of Systems  Cardiovascular:  Positive for chest pain.  Musculoskeletal:        Right shoulder pain  All other systems reviewed and are negative.   Physical Exam Updated Vital Signs BP 108/61   Pulse 64   Temp 98.3 F (36.8 C) (Oral)   Resp 18   Ht 5\' 5"  (1.651 m)   Wt 72.6 kg   LMP 07/18/2010   SpO2 100%   BMI 26.63 kg/m  Physical Exam Vitals and nursing note reviewed.  Constitutional:      Appearance: Normal appearance.  HENT:     Head: Normocephalic and atraumatic.     Nose: Nose normal.     Mouth/Throat:     Mouth: Mucous membranes are moist.  Eyes:     Extraocular Movements: Extraocular movements intact.     Conjunctiva/sclera: Conjunctivae normal.     Pupils: Pupils are equal, round, and reactive to light.  Cardiovascular:  Rate and Rhythm: Normal rate and regular rhythm.     Pulses: Normal pulses.     Heart sounds: Normal heart sounds. No murmur heard.    No gallop.  Pulmonary:     Effort: Pulmonary effort is normal. No respiratory distress.     Breath sounds: Normal breath sounds. No wheezing or rales.     Comments: Tender to palpation over the right anterior chest wall Abdominal:     General: Abdomen is flat. Bowel sounds are normal. There is no distension.     Palpations: Abdomen is soft.     Tenderness: There is no abdominal tenderness. There is no guarding.  Musculoskeletal:        General: No swelling, deformity or signs of injury. Normal range of motion.     Cervical back: Normal  range of motion and neck supple.     Comments: Tenderness palpation over the right anterior shoulder, nontender palpation over right elbow, wrist, full range of motion noted throughout, no overlying erythema or warmth, radial pulses 2+ distally, cap refill less than 2 seconds distally, sensation intact distally, no obvious deformity or bruising, no skin breakdown or ulceration, no lacerations or abrasions  Skin:    General: Skin is warm and dry.  Neurological:     General: No focal deficit present.     Mental Status: She is alert and oriented to person, place, and time. Mental status is at baseline.  Psychiatric:        Mood and Affect: Mood normal.        Behavior: Behavior normal.        Thought Content: Thought content normal.        Judgment: Judgment normal.     ED Results / Procedures / Treatments   Labs (all labs ordered are listed, but only abnormal results are displayed) Labs Reviewed  COMPREHENSIVE METABOLIC PANEL WITH GFR  CBC WITH DIFFERENTIAL/PLATELET  TROPONIN I (HIGH SENSITIVITY)    EKG None  Radiology No results found.  Procedures Procedures    Medications Ordered in ED Medications  ketorolac  (TORADOL ) 15 MG/ML injection 15 mg (has no administration in time range)  oxyCODONE -acetaminophen  (PERCOCET/ROXICET) 5-325 MG per tablet 1 tablet (has no administration in time range)    ED Course/ Medical Decision Making/ A&P Clinical Course as of 07/22/23 1431  Mon Jul 22, 2023  1055 EKG interpretation: Normal sinus rhythm, rate of 71, normal PR/QRS interval, normal QTc, no ST/T wave changes, no ischemic changes, no STEMI, normal axis [CR]    Clinical Course User Index [CR] [name], PA-C                                 Medical Decision Making Amount and/or Complexity of Data Reviewed Labs: ordered. Radiology: ordered.  Risk Prescription drug management.   This patient presents to the ED for concern of right shoulder pain, right-sided  chest pain differential diagnosis includes ACS, pulmonary embolus, sprain, strain, contusion, musculoskeletal pain, pneumonia, pneumothorax, hemothorax    Additional history obtained:  Additional history obtained from records External records from outside source obtained and reviewed including none   Lab Tests:  I Ordered, and personally interpreted labs.  The pertinent results include: Mild anemia, no leukocytosis, normal troponin, no electrolyte changes, normal kidney function liver function   Imaging Studies ordered:  I ordered imaging studies including x-ray of right shoulder, CTA of the chest I independently visualized and  interpreted imaging which showed no acute osseous injury, no pulmonary embolus or other acute cardiopulmonary process I agree with the radiologist interpretation   Medicines ordered and prescription drug management:  I ordered medication including Toradol , Percocet for pain Reevaluation of the patient after these medicines showed that the patient improved I have reviewed the patients home medicines and have made adjustments as needed   Problem List / ED Course:  Patient is feeling much better at this time and is stable for discharge home.  Discussed with patient's overall workup in the emergency department has been unremarkable.  Blood work has been unremarkable.  EKG demonstrates no acute ischemic changes and patient has negative troponin.  Do not suspect ACS.  CT of the chest demonstrated no indication for pulmonary embolus.  Patient has no clinical indication midline etiology such as septic joint.  Suspect musculoskeletal pain at this time and will continue treatment with NSAIDs on outpatient basis.  Strict turn precautions were discussed for any new or worsening symptoms.  Patient voiced understanding and had no additional questions.   Social Determinants of Health:  None           Final Clinical Impression(s) / ED Diagnoses Final diagnoses:   None    Rx / DC Orders ED Discharge Orders     None         Emmalene Hare 07/22/23 1433    Dorenda Gandy, MD 07/23/23 [phone]

## 2023-08-17 ENCOUNTER — Emergency Department (HOSPITAL_COMMUNITY)

## 2023-08-17 ENCOUNTER — Emergency Department (HOSPITAL_COMMUNITY)
Admission: EM | Admit: 2023-08-17 | Discharge: 2023-08-17 | Disposition: A | Discharge status: Home / Self Care | Attending: Emergency Medicine | Admitting: Emergency Medicine

## 2023-08-17 ENCOUNTER — Other Ambulatory Visit: Payer: Self-pay

## 2023-08-17 ENCOUNTER — Encounter (HOSPITAL_COMMUNITY): Payer: Self-pay

## 2023-08-17 DIAGNOSIS — M546 Pain in thoracic spine: Secondary | ICD-10-CM | POA: Diagnosis present

## 2023-08-17 MED ORDER — PANTOPRAZOLE SODIUM 40 MG PO TBEC
40.0000 mg | DELAYED_RELEASE_TABLET | Freq: Once | ORAL | Status: AC
  Administered 2023-08-17: 40 mg via ORAL
  Filled 2023-08-17: qty 1

## 2023-08-17 MED ORDER — METHOCARBAMOL 500 MG PO TABS
500.0000 mg | ORAL_TABLET | Freq: Once | ORAL | Status: AC
  Administered 2023-08-17: 500 mg via ORAL
  Filled 2023-08-17: qty 1

## 2023-08-17 MED ORDER — PREDNISONE 20 MG PO TABS
ORAL_TABLET | ORAL | 0 refills | Status: DC
Start: 2023-08-17 — End: 2023-10-29

## 2023-08-17 MED ORDER — PANTOPRAZOLE SODIUM 20 MG PO TBEC: 20.0000 mg | DELAYED_RELEASE_TABLET | Freq: Every day | ORAL | 0 refills | Status: AC

## 2023-08-17 MED ORDER — PREDNISONE 50 MG PO TABS
60.0000 mg | ORAL_TABLET | Freq: Once | ORAL | Status: AC
  Administered 2023-08-17: 60 mg via ORAL
  Filled 2023-08-17: qty 1

## 2023-08-17 MED ORDER — HYDROMORPHONE HCL 1 MG/ML IJ SOLN
1.0000 mg | Freq: Once | INTRAMUSCULAR | Status: AC
  Administered 2023-08-17: 1 mg via INTRAMUSCULAR
  Filled 2023-08-17: qty 1

## 2023-08-17 MED ORDER — METHOCARBAMOL 500 MG PO TABS: 500.0000 mg | ORAL_TABLET | Freq: Three times a day (TID) | ORAL | 0 refills | Status: AC | PRN

## 2023-08-17 MED ORDER — MELOXICAM 7.5 MG PO TABS: 7.5000 mg | ORAL_TABLET | Freq: Every day | ORAL | 0 refills | Status: DC

## 2023-08-17 MED ORDER — KETOROLAC TROMETHAMINE 60 MG/2ML IM SOLN
30.0000 mg | Freq: Once | INTRAMUSCULAR | Status: AC
  Administered 2023-08-17: 30 mg via INTRAMUSCULAR
  Filled 2023-08-17: qty 2

## 2023-08-17 NOTE — ED Provider Notes (Signed)
 [place] EMERGENCY DEPARTMENT AT [facility] Provider Note   CSN: [id number] Arrival date & time: 08/17/23  0216     History {Add pertinent medical, surgical, social history, OB history to HPI:1} Chief Complaint  Patient presents with   Back Pain    Amber Drake is a 63 y.o. female.  63 year old female presents the ER today with upper back pain.  Patient states that has been going on for couple days progressively worsening.  It is a sharp pain.  Worse when he moves or lays certain ways.  She has no numbness, tingling, weakness anywhere.  No recent illnesses although she has been coughing.  No fevers.  No trauma.  No other associated symptoms.   Back Pain      Home Medications Prior to Admission medications   Medication Sig Start Date End Date Taking? Authorizing Provider  atorvastatin  (LIPITOR) 20 MG tablet Take 1 tablet (20 mg total) by mouth daily. 03/14/15   [name], PA-C  doxycycline  (VIBRA -TABS) 100 MG tablet Take 1 tablet (100 mg total) by mouth 2 (two) times daily. 07/11/23   [name], NP  gabapentin (NEURONTIN) 300 MG capsule Take 300 mg by mouth daily. 08/15/22   [provider]  lisinopril -hydrochlorothiazide  (PRINZIDE ,ZESTORETIC ) 20-12.5 MG tablet Take 1 tablet by mouth daily.    [provider]  meclizine  (ANTIVERT ) 25 MG tablet Take 1 tablet (25 mg total) by mouth 3 (three) times daily as needed for dizziness. 05/19/23   [name], PA-C  methocarbamol  (ROBAXIN ) 500 MG tablet Take 1 tablet (500 mg total) by mouth every 8 (eight) hours as needed for muscle spasms. 10/23/22   [name], MD  Multiple Vitamin (MULTIVITAMIN WITH MINERALS) TABS tablet Take 1 tablet by mouth daily.    [provider]  naproxen  (NAPROSYN ) 500 MG tablet Take 1 tablet (500 mg total) by mouth 2 (two) times daily. 07/22/23   [name], PA-C  oxyCODONE -acetaminophen  (PERCOCET) 10-325 MG tablet Take 1 tablet by  mouth 3 (three) times daily as needed. 06/21/23   [provider]  potassium chloride  SA (KLOR-CON ) 20 MEQ tablet TAKE (1) TABLET BY MOUTH TWICE DAILY. Patient taking differently: Take 20 mEq by mouth. 01/31/21   [name], MD  silver  sulfADIAZINE  (SILVADENE ) 1 % cream Apply 1 Application topically daily. 07/11/23   [name], NP      Allergies    Penicillins, Penicillins cross reactors, and Tape    Review of Systems   Review of Systems  Musculoskeletal:  Positive for back pain.    Physical Exam Updated Vital Signs BP 130/69 (BP Location: Left Arm)   Pulse 78   Temp 98.1 F (36.7 C) (Oral)   Resp 17   LMP 07/18/2010   SpO2 100%  Physical Exam Vitals and nursing note reviewed.  Constitutional:      Appearance: She is well-developed.  HENT:     Head: Normocephalic and atraumatic.  Cardiovascular:     Rate and Rhythm: Normal rate and regular rhythm.  Pulmonary:     Effort: No respiratory distress.     Breath sounds: No stridor.  Abdominal:     General: There is no distension.  Musculoskeletal:        General: Tenderness (Upper thoracic spine without deformity.  No paraspinal tenderness.  Also worse with twisting.) present.     Cervical back: Normal range of motion.  Neurological:     Mental Status: She is alert.  ED Results / Procedures / Treatments   Labs (all labs ordered are listed, but only abnormal results are displayed) Labs Reviewed - No data to display  EKG None  Radiology No results found.  Procedures Procedures    Medications Ordered in ED Medications  HYDROmorphone  (DILAUDID ) injection 1 mg (1 mg Intramuscular Incomplete 08/17/23 0322)    ED Course/ Medical Decision Making/ A&P                                 Medical Decision Making Amount and/or Complexity of Data Reviewed Radiology: ordered.  Risk Prescription drug management.  Could be musculoskeletal from a coughing.  Although does have a history of  degenerative disc disease and does not be quite uncomfortable even though she has been taking naproxen  and Percocet at home.  Low suspicion for abscess or other space-occupying lesion.  Does not radiate to suggest any type of stenosis.  Will treat symptomatically pending imaging. ***  {Document critical care time when appropriate:1} {Document review of labs and clinical decision tools ie heart score, Chads2Vasc2 etc:1}  {Document your independent review of radiology images, and any outside records:1} {Document your discussion with family members, caretakers, and with consultants:1} {Document social determinants of health affecting pt's care:1} {Document your decision making why or why not admission, treatments were needed:1} Final Clinical Impression(s) / ED Diagnoses Final diagnoses:  None    Rx / DC Orders ED Discharge Orders     None

## 2023-08-17 NOTE — ED Triage Notes (Signed)
 Pt is coming in with mid upper back pain that extends into her thoracic spine, She mentions that she has some degenerative disc disease and arthritis, in the last 2 days it has worsened and more so tonight which has brought her into the ER. She is ambulatory without assistance, she does not have any numbness or tingling in her extremities. She has tried medication home such as naproxen  for pain with no relief.

## 2023-08-20 ENCOUNTER — Other Ambulatory Visit: Payer: Self-pay | Admitting: *Deleted

## 2023-08-20 DIAGNOSIS — C884 Extranodal marginal zone b-cell lymphoma of mucosa-associated lymphoid tissue (malt-lymphoma) not having achieved remission: Secondary | ICD-10-CM

## 2023-08-21 ENCOUNTER — Inpatient Hospital Stay: Payer: Medicaid Other | Attending: Hematology

## 2023-08-21 ENCOUNTER — Inpatient Hospital Stay (HOSPITAL_BASED_OUTPATIENT_CLINIC_OR_DEPARTMENT_OTHER): Payer: Medicaid Other | Admitting: Hematology

## 2023-08-21 VITALS — BP 147/65 | HR 78 | Temp 97.0°F | Resp 20 | Wt 166.6 lb

## 2023-08-21 DIAGNOSIS — R319 Hematuria, unspecified: Secondary | ICD-10-CM | POA: Diagnosis not present

## 2023-08-21 DIAGNOSIS — C884 Extranodal marginal zone b-cell lymphoma of mucosa-associated lymphoid tissue (malt-lymphoma) not having achieved remission: Secondary | ICD-10-CM | POA: Diagnosis not present

## 2023-08-21 DIAGNOSIS — D72829 Elevated white blood cell count, unspecified: Secondary | ICD-10-CM | POA: Diagnosis not present

## 2023-08-21 DIAGNOSIS — C8309 Small cell B-cell lymphoma, extranodal and solid organ sites: Secondary | ICD-10-CM | POA: Diagnosis present

## 2023-08-21 DIAGNOSIS — Z7952 Long term (current) use of systemic steroids: Secondary | ICD-10-CM | POA: Diagnosis not present

## 2023-08-21 DIAGNOSIS — L0292 Furuncle, unspecified: Secondary | ICD-10-CM | POA: Insufficient documentation

## 2023-08-21 LAB — CMP (CANCER CENTER ONLY)
ALT: 15 U/L (ref 0–44)
AST: 13 U/L — ABNORMAL LOW (ref 15–41)
Albumin: 4 g/dL (ref 3.5–5.0)
Alkaline Phosphatase: 72 U/L (ref 38–126)
Anion gap: 7 (ref 5–15)
BUN: 16 mg/dL (ref 8–23)
CO2: 28 mmol/L (ref 22–32)
Calcium: 9.6 mg/dL (ref 8.9–10.3)
Chloride: 107 mmol/L (ref 98–111)
Creatinine: 0.81 mg/dL (ref 0.44–1.00)
GFR, Estimated: >60 mL/min (ref >60)
Glucose, Bld: 133 mg/dL — ABNORMAL HIGH (ref 70–99)
Potassium: 4.1 mmol/L (ref 3.5–5.1)
Sodium: 142 mmol/L (ref 135–145)
Total Bilirubin: 0.2 mg/dL (ref 0.0–1.2)
Total Protein: 6.6 g/dL (ref 6.5–8.1)

## 2023-08-21 LAB — CBC WITH DIFFERENTIAL (CANCER CENTER ONLY)
Abs Immature Granulocytes: 0.11 10*3/uL — ABNORMAL HIGH (ref 0.00–0.07)
Basophils Absolute: 0 10*3/uL (ref 0.0–0.1)
Basophils Relative: 0 %
Eosinophils Absolute: 0 10*3/uL (ref 0.0–0.5)
Eosinophils Relative: 0 %
HCT: 33.6 % — ABNORMAL LOW (ref 36.0–46.0)
Hemoglobin: 11.5 g/dL — ABNORMAL LOW (ref 12.0–15.0)
Immature Granulocytes: 1 %
Lymphocytes Relative: 14 %
Lymphs Abs: 1.8 10*3/uL (ref 0.7–4.0)
MCH: 28.5 pg (ref 26.0–34.0)
MCHC: 34.2 g/dL (ref 30.0–36.0)
MCV: 83.4 fL (ref 80.0–100.0)
Monocytes Absolute: 0.4 10*3/uL (ref 0.1–1.0)
Monocytes Relative: 3 %
Neutro Abs: 10.7 10*3/uL — ABNORMAL HIGH (ref 1.7–7.7)
Neutrophils Relative %: 82 %
Platelet Count: 374 10*3/uL (ref 150–400)
RBC: 4.03 MIL/uL (ref 3.87–5.11)
RDW: 13 % (ref 11.5–15.5)
WBC Count: 13 10*3/uL — ABNORMAL HIGH (ref 4.0–10.5)
nRBC: 0 % (ref 0.0–0.2)

## 2023-08-21 LAB — LACTATE DEHYDROGENASE: LDH: 145 U/L (ref 98–192)

## 2023-08-21 NOTE — Progress Notes (Signed)
 [facility] OFFICE PROGRESS NOTE DOS: 08/21/23  Center, [facility] [address] 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  CURRENT THERAPY: maintenance Rituxan  q8weeks  INTERVAL HISTORY:  [name] is here for continued evaluation and management of extranodal marginal zone lymphoma.  Patient was last seen by me on 03/06/2023.  She has had several ED visits since her last clinical visit with us , including for dizziness, chest pain, acute pain of right shoulder, and acute midline thoracic back pain.  Patient reports that she has been doing well overall since her last clinical visit.   Patient is currently on Prednisone  for her back pain. She reports that her back pain resolved with steroids.   She received an MRI in February 2025 which was stable with no signs of lymphoma and did not show any reasons for her dizziness. She has no dizziness at this time or new headache.  Patient reports recent back pain. CT scan of the thoracic spine showed degenerative changes and mild multifactorial spinal stenosis is possible at T10-T11.  She reports that she generally follows up with her PCP once a month.   She reports that her energy levels have been good. Patient denies any night sweats, new lumps/bumps, new abdominal pain, neck stiffness, leg swelling, or recent infections.  She reports that she received cystoscopy and CT scan for hematuria.   MEDICAL HISTORY: Past Medical History:  Diagnosis Date   Anemia    Arthritis    Blood dyscrasia    sickle cell trait   Cancer (HCC)    lymphoma   Carbuncle and furuncle    Hypercholesterolemia    Hypertension    does not take meds   Papanicolaou smear of cervix with positive high risk human papilloma virus (HPV) test 08/23/2020   08/23/20    Colpo per ASCCP guidelines, immediate risk of CIN 3+ is 4.1 %    Sickle cell trait (HCC)     ALLERGIES:  is allergic to penicillins, penicillins cross reactors, and tape.  MEDICATIONS:  Current Outpatient Medications  Medication Sig Dispense Refill   atorvastatin  (LIPITOR) 20 MG tablet Take 1 tablet (20 mg total) by mouth daily. 90 tablet 2   gabapentin (NEURONTIN) 300 MG capsule Take 300 mg by mouth daily.     lisinopril -hydrochlorothiazide  (PRINZIDE ,ZESTORETIC ) 20-12.5 MG tablet Take 1 tablet by mouth daily.     meclizine  (ANTIVERT ) 25 MG tablet Take 1 tablet (25 mg total) by mouth 3 (three) times daily as needed for dizziness. 30 tablet 0   meloxicam  (MOBIC ) 7.5 MG tablet Take 1 tablet (7.5 mg total) by mouth daily. 51 tablet 0   methocarbamol  (ROBAXIN ) 500 MG tablet Take 1 tablet (500 mg total) by mouth every 8 (eight) hours as needed for muscle spasms. 20 tablet 0   Multiple Vitamin (MULTIVITAMIN WITH MINERALS) TABS tablet Take 1 tablet by mouth daily.     oxyCODONE -acetaminophen  (PERCOCET) 10-325 MG tablet Take 1 tablet by mouth 3 (three) times daily as needed.     pantoprazole  (PROTONIX ) 20 MG tablet Take 1 tablet (20 mg total) by mouth daily. 15 tablet 0   potassium chloride  SA (KLOR-CON ) 20 MEQ tablet TAKE (1) TABLET BY MOUTH TWICE DAILY. (Patient taking differently: Take 20 mEq by mouth.) 60 tablet 1   predniSONE  (DELTASONE ) 20 MG tablet 3 tabs po daily x 3 days, then 2 tabs x 3 days, then 1.5 tabs x  3 days, then 1 tab x 3 days, then 0.5 tabs x 3 days 27 tablet 0   silver  sulfADIAZINE  (SILVADENE ) 1 % cream Apply 1 Application topically daily. 50 g 1   No current facility-administered medications for this visit.   Facility-Administered Medications Ordered in Other Visits  Medication Dose Route Frequency Provider Last Rate Last Admin   acetaminophen  (TYLENOL ) tablet 500 mg  500 mg Oral Once [name], MD        SURGICAL HISTORY:  Past Surgical History:  Procedure Laterality Date   APPENDECTOMY     APPLICATION OF CRANIAL  NAVIGATION N/A 07/28/2018   Procedure: APPLICATION OF CRANIAL NAVIGATION;  Surgeon: Gearl Keens, MD;  Location: [facility] OR;  Service: Neurosurgery;  Laterality: N/A;   ARTHOSCOPIC ROTAOR CUFF REPAIR Right 08/24/2021   Procedure: ARTHROSCOPIC ROTATOR CUFF REPAIR;  Surgeon: Tonita Frater, MD;  Location: [facility] ORS;  Service: Orthopedics;  Laterality: Right;   BRAIN SURGERY  2020   CARPAL TUNNEL RELEASE  03/23/2011   Procedure: CARPAL TUNNEL RELEASE;  Surgeon: Pleasant Brilliant;  Location: [facility] ORS;  Service: Orthopedics;  Laterality: Left;   CARPAL TUNNEL RELEASE  05/10/2011   Procedure: CARPAL TUNNEL RELEASE;  Surgeon: Pleasant Brilliant, MD;  Location: [facility];  Service: Orthopedics;  Laterality: Right;   CESAREAN SECTION     x 2   COLONOSCOPY WITH PROPOFOL  N/A 11/16/2020   Procedure: COLONOSCOPY WITH PROPOFOL ;  Surgeon: Urban Garden, MD;  Location: [facility] SUITE;  Service: Gastroenterology;  Laterality: N/A;  9:15   INCISION AND DRAINAGE PERIRECTAL ABSCESS  12/21/2009   LUMBAR LAMINECTOMY/DECOMPRESSION MICRODISCECTOMY Left 12/12/2020   Procedure: Laminectomy and Foraminotomy - left - L5-S1;  Surgeon: Gearl Keens, MD;  Location: [facility] OR;  Service: Neurosurgery;  Laterality: Left;  3C   PR DURAL GRAFT SPINAL N/A 07/28/2018   Procedure: Stereotactic open biopsy of Right cerebellar hemisphere and dura with brainlab;  Surgeon: Gearl Keens, MD;  Location[facility] OR;  Service: Neurosurgery;  Laterality: N/A;  Stereotactic open biopsy of Right cerebellar hemisphere and dura with brainlab   PROCTOSCOPY  10/17/2010   Procedure: PROCTOSCOPY;  Surgeon: Myrl Askew;  Location: [facility];  Service: General;  Laterality: N/A;  Rigid Proctoscopy/Possible Fistula in Ano  Procedure ended at 1003   SHOULDER ARTHROSCOPY WITH BICEPSTENOTOMY Right 08/24/2021   Procedure: SHOULDER ARTHROSCOPY WITH BICEPSTENOTOMY;  Surgeon: Tonita Frater, MD;  Location: [facility];  Service: Orthopedics;  Laterality: Right;   THERAPEUTIC ABORTION      x2    REVIEW OF SYSTEMS:   .10 Point review of Systems was done is negative except as noted above.   PHYSICAL EXAMINATION:  Blood pressure (!) 147/65, pulse 78, temperature (!) 97 F (36.1 C), resp. rate 20, weight 166 lb 9.6 oz (75.6 kg), last menstrual period 07/18/2010, SpO2 97%. GENERAL:alert, in no acute distress and comfortable SKIN: no acute rashes, no significant lesions EYES: conjunctiva are pink and non-injected, sclera anicteric OROPHARYNX: MMM, no exudates, no oropharyngeal erythema or ulceration NECK: supple, no JVD LYMPH:  no palpable lymphadenopathy in the cervical, axillary or inguinal regions LUNGS: clear to auscultation b/l with normal respiratory effort HEART: regular rate & rhythm ABDOMEN:  normoactive bowel sounds , non tender, not distended. Extremity: no pedal edema PSYCH: alert & oriented x 3 with fluent speech NEURO: no focal motor/sensory deficits   LABORATORY DATA:     Latest Ref Rng & Units 08/21/2023    8:37 AM 07/22/2023   11:07 AM 07/09/2023  5:33 PM  CBC  WBC 4.0 - 10.5 K/uL 13.0  7.2  7.9   Hemoglobin 12.0 - 15.0 g/dL 16.1  09.6  04.5   Hematocrit 36.0 - 46.0 % 33.6  34.0  37.5   Platelets 150 - 400 K/uL 374  351  346     .    Latest Ref Rng & Units 08/21/2023    8:37 AM 07/22/2023   11:07 AM 07/09/2023    5:33 PM  CMP  Glucose 70 - 99 mg/dL 409  93  89   BUN 8 - 23 mg/dL 16  13  14    Creatinine 0.44 - 1.00 mg/dL 8.11  9.14  7.82   Sodium 135 - 145 mmol/L 142  139  141   Potassium 3.5 - 5.1 mmol/L 4.1  3.7  3.9   Chloride 98 - 111 mmol/L 107  104  103   CO2 22 - 32 mmol/L 28  27  28    Calcium  8.9 - 10.3 mg/dL 9.6  9.5  95.6   Total Protein 6.5 - 8.1 g/dL 6.6  6.5  7.2   Total Bilirubin 0.0 - 1.2 mg/dL 0.2  0.6  0.5   Alkaline Phos 38 - 126 U/L 72  70  71   AST 15 - 41 U/L 13  16  18    ALT 0 - 44 U/L 15  11  17      . Lab Results  Component Value Date   LDH 145 08/21/2023    RADIOGRAPHIC STUDIES:  CT Thoracic Spine Wo  Contrast Result Date: 08/17/2023 CLINICAL DATA:  63 year old female with back pain progressing over the past 2 days. EXAM: CT THORACIC SPINE WITHOUT CONTRAST TECHNIQUE: Multidetector CT images of the thoracic were obtained using the standard protocol without intravenous contrast. RADIATION DOSE REDUCTION: This exam was performed according to the departmental dose-optimization program which includes automated exposure control, adjustment of the mA and/or kV according to patient size and/or use of iterative reconstruction technique. COMPARISON:  CTA chest 07/02/2023. FINDINGS: Limited cervical spine imaging: Lower cervical disc and endplate degeneration C5-C6 and C6-C7. Cervicothoracic junction alignment is within normal limits. Thoracic spine segmentation:  Normal. Alignment: Stable kyphosis from last month. Minor dextroconvex thoracic scoliosis. No spondylolisthesis. Vertebrae: Stable bone mineralization. Maintained thoracic vertebral height. Thoracic vertebrae appear intact. Visible posterior ribs appear intact. Visible upper lumbar levels appear intact. Paraspinal and other soft tissues: Centrilobular emphysema. Mildly improved lung volumes. Decreased dependent atelectasis bilaterally. Visible major airways are patent. No pericardial or pleural effusion. Calcified aortic atherosclerosis. Otherwise negative visible noncontrast mediastinum. Negative visible noncontrast upper abdominal viscera. Thoracic paraspinal soft tissues are within normal limits. Disc levels: Age-appropriate thoracic spine degeneration at most levels. Disc bulging, endplate spurring which is mostly anterior. Mild facet hypertrophy. Chronic T11 anterior inferior endplate degenerative appearing sclerosis with adjacent vacuum disc, stable. Possible mild multifactorial thoracic spinal stenosis at T10-T11. Broad-based disc bulging there. No other significant thoracic spinal stenosis by CT. IMPRESSION: 1. No acute osseous abnormality in the Thoracic  Spine. 2. Mostly age-appropriate thoracic spine degeneration. Mild multifactorial spinal stenosis is possible at T10-T11. 3. Aortic Atherosclerosis (ICD10-I70.0) and Emphysema (ICD10-J43.9). Electronically Signed   By: Marlise Simpers M.D.   On: 08/17/2023 03:58   DG Chest 2 View Result Date: 08/17/2023 CLINICAL DATA:  Cough and chest pain EXAM: CHEST - 2 VIEW COMPARISON:  07/09/2018 FINDINGS: The heart size and mediastinal contours are within normal limits. Both lungs are clear. The visualized skeletal structures are unremarkable. IMPRESSION:  No active cardiopulmonary disease. Electronically Signed   By: Violeta Grey M.D.   On: 08/17/2023 03:42   CT Angio Chest PE W/Cm &/Or Wo Cm Result Date: 07/22/2023 CLINICAL DATA:  Pulmonary embolism (PE) suspected, high prob. Right shoulder pain. EXAM: CT ANGIOGRAPHY CHEST WITH CONTRAST TECHNIQUE: Multidetector CT imaging of the chest was performed using the standard protocol during bolus administration of intravenous contrast. Multiplanar CT image reconstructions and MIPs were obtained to evaluate the vascular anatomy. RADIATION DOSE REDUCTION: This exam was performed according to the departmental dose-optimization program which includes automated exposure control, adjustment of the mA and/or kV according to patient size and/or use of iterative reconstruction technique. CONTRAST:  75mL OMNIPAQUE  IOHEXOL  350 MG/ML SOLN COMPARISON:  07/25/2018 FINDINGS: Cardiovascular: Heart is normal size. Aorta is normal caliber. No filling defects in the pulmonary arteries to suggest pulmonary emboli. Scattered aortic calcifications. Mediastinum/Nodes: No mediastinal, hilar, or axillary adenopathy. Trachea and esophagus are unremarkable. Thyroid unremarkable. Lungs/Pleura: Minimal dependent atelectasis in the lower lobes. No confluent opacities or effusions. Upper Abdomen: No acute findings Musculoskeletal: Chest wall soft tissues are unremarkable. No acute bony abnormality. Review of the MIP  images confirms the above findings. IMPRESSION: No evidence of pulmonary embolus. Dependent atelectasis in the lower lobes. No acute cardiopulmonary disease. Aortic Atherosclerosis (ICD10-I70.0). Electronically Signed   By: Janeece Mechanic M.D.   On: 07/22/2023 14:25   DG Shoulder Right Result Date: 07/22/2023 CLINICAL DATA:  Right shoulder pain for 2 days. No history of trauma. History of rotator cuff surgery 2 years ago EXAM: RIGHT SHOULDER - 3 VIEW COMPARISON:  X-ray 09/06/2021 FINDINGS: Osteopenia. Small osteophytes of the AC joint and glenohumeral joint. No fracture or dislocation. IMPRESSION: Mild degenerative changes of the [facility] joint and glenohumeral joint. Electronically Signed   By: Adrianna Horde M.D.   On: 07/22/2023 12:12    MRI Brain 06/20/2022: IMPRESSION: 1. No evidence of acute intracranial abnormality or metastatic disease. 2. 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     Electronically Signed   By: Stevenson Elbe M.D.   On: 06/20/2022 15:16   ASSESSMENT/PLAN:   63 y.o. female with   1. 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 The process appears to extend into the upper cervical canal. Mild obstructive hydrocephalus. Differential includes tumor including metastatic disease and lymphoma. Chronic inflammatory process such as sarcoid is a consideration however chest x-ray negative. TB and atypical infection/fungus also possible. 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 2. Nonspecific focus of increased uptake within the cord extending from T12 to L1. Cannot rule out additional site of CNS lymphoma. Consider further evaluation with contrast enhanced MRI through this area. 3. Aortic Atherosclerosis and Emphysema."   10/16/2018 MRI brain w and w/o contrast revealed "1. Resolved dural mass in the right posterior fossa with minimal smooth dural thickening  that may be treatment related. Cerebellar edema and mass effect is also resolved. No new site of disease. 2. 6 mm colloid cyst."   10/20/2018 MRI cervical spine w and w/o contrast revealed "Negative for lymphoma.  No acute abnormality. Central disc protrusion at C5-6 effaces the ventral thecal sac causing mild central canal narrowing. There is also a shallow disc bulge at C6-7 which narrows but does not efface the ventral thecal Sac."   01/13/2019 MRI Brain ([id number]) revealed "1. Stable and satisfactory post treatment appearance of the posterior fossa. 2. Stable small 6 mm colloid cyst. 3. No new intracranial abnormality."   05/06/2019 MRI Cervical Spine ([id number]) revealed "No evidence of lymphoma in the cervical spine. Previously noted dural enhancement in the posterior fossa and cervical canal has resolved. No cord compression or cord lesion. Chronic cervical spine degenerative changes are stable from the prior study."   12/15/2019 MRI Brain ([id number]) revealed "No acute intracranial abnormality. No enhancing mass lesion 5 mm colloid cyst in the third ventricle is chronic and unchanged from prior studies."  08/14/2020, MR Brain ([id number]) which revealed "1. Unchanged appearance of the brain. No evidence of recurrent intracranial lymphoma. 2. Small colloid cyst without  hydrocephalus."   06/22/2021 MRI brain- Stable MRI. No recurrent mass in the right posterior fossa at the site of prior craniotomy.   PLAN:  Hematuria Asymptomatic, microscopic hematuria noted on routine urine studies. No visible hematuria, dysuria, or abdominal pain.  Meningeal/CNS Marginal Zone Lymphoma PLAN:  -No new neurological symptoms, headaches, or vision changes. Stable condition with no signs of recurrence. -Discussed lab results on 08/21/23 in detail with patient. CBC showed WBC of 13K, hemoglobin of 11.5, and platelets of 374K. -WBC slightly elevated likely due to Prednisone .  -there is no sign of lymphoma in the abdomen based on her recent CT scans -CTA chest scan showed no findings of blood clot or sign of lymphoma -did not feel any enlarged lymph nodes during physical examination -patient shall continue to follow with PCP for degenerative changes and possible mild multifactorial spinal stenosis management, or potentially orthopedics if needed.  -patient shall return to clinic with us  in one year  Recurrent Boils Recurrent boils, possibly related to hidradenitis suppurativa or MRSA colonization. Currently using topical and oral antibiotics. -Continue current treatment. Consider lifestyle modifications such as reducing soda intake and maintaining good hygiene.  General Health Maintenance -Continue regular mammograms and colonoscopies as per guidelines. -Plan for retirement in January.   FOLLOW UP: RTC with Dr Salomon Cree with labs in 12 months  The total time spent in the appointment was 25 minutes* .  All of the patient's questions were answered with apparent satisfaction. The patient knows to call the clinic with any problems, questions or concerns.   Jacquelyn Matt MD MS AAHIVMS [facility] Hematology/Oncology Physician [facility]  .*Total Encounter Time as defined by the Centers for Medicare and Medicaid Services includes, in addition to the face-to-face  time of a patient visit (documented in the note above) non-face-to-face time: obtaining and reviewing outside history, ordering and reviewing medications, tests or procedures, care coordination (communications with other health care professionals or caregivers) and documentation in the medical record.   I,Mitra Faeizi,acting as a Neurosurgeon for Jacquelyn Matt, MD.,have documented all relevant documentation on the behalf of Jacquelyn Matt, MD,as directed by  Jacquelyn Matt, MD while in the presence of Jacquelyn Matt, MD.  .I have reviewed the above documentation for accuracy and completeness, and I agree with the above. .Whitlee Sluder  Teddie Favre MD

## 2023-08-27 ENCOUNTER — Ambulatory Visit: Admitting: Adult Health

## 2023-08-27 ENCOUNTER — Encounter: Payer: Self-pay | Admitting: Adult Health

## 2023-08-27 VITALS — BP 132/74 | HR 72 | Ht 66.0 in | Wt 167.5 lb

## 2023-08-27 DIAGNOSIS — Z01419 Encounter for gynecological examination (general) (routine) without abnormal findings: Secondary | ICD-10-CM | POA: Diagnosis not present

## 2023-08-27 DIAGNOSIS — I10 Essential (primary) hypertension: Secondary | ICD-10-CM | POA: Diagnosis not present

## 2023-08-27 DIAGNOSIS — L732 Hidradenitis suppurativa: Secondary | ICD-10-CM

## 2023-08-27 DIAGNOSIS — Z1331 Encounter for screening for depression: Secondary | ICD-10-CM | POA: Diagnosis not present

## 2023-08-27 NOTE — Progress Notes (Signed)
 Patient ID: Amber Drake, female   DOB: 1960-09-01, 63 y.o.   MRN: [id number] History of Present Illness: Amber Drake is a 63 year old black female, married, PM in for a well woman gyn exam. She retired in February.      Component Value Date/Time   DIAGPAP  08/23/2022 1330    - Negative for intraepithelial lesion or malignancy (NILM)   DIAGPAP  08/18/2021 1020    - Negative for intraepithelial lesion or malignancy (NILM)   DIAGPAP  08/16/2020 1022    - Negative for intraepithelial lesion or malignancy (NILM)   HPVHIGH Negative 08/23/2022 1330   HPVHIGH Positive (A) 08/18/2021 1020   HPVHIGH Positive (A) 08/16/2020 1022   ADEQPAP  08/23/2022 1330    Satisfactory for evaluation; transformation zone component ABSENT.   ADEQPAP  08/18/2021 1020    Satisfactory for evaluation; transformation zone component PRESENT.   ADEQPAP  08/16/2020 1022    Satisfactory for evaluation; transformation zone component PRESENT.   PCP is [facility]    Current Medications, Allergies, Past Medical History, Past Surgical History, Family History and Social History were reviewed in [employer] record.     Review of Systems: Patient denies any headaches, hearing loss, fatigue, blurred vision, shortness of breath, chest pain, abdominal pain, problems with bowel movements, or intercourse(not active). No joint pain or mood swings.  Has to void often, about every 2-3 hours, no burning    Physical Exam:BP 132/74 (BP Location: Right Arm, Patient Position: Sitting, Cuff Size: Normal)   Pulse 72   Ht 5\' 6"  (1.676 m)   Wt 167 lb 8 oz (76 kg)   LMP 07/18/2010   BMI 27.04 kg/m   General:  Well developed, well nourished, no acute distress Skin:  Warm and dry Neck:  Midline trachea, normal thyroid, good ROM, no lymphadenopathy,no carotid bruits heard  Lungs; Clear to auscultation bilaterally Breast:  No dominant palpable mass, retraction, or nipple discharge Cardiovascular: Regular rate  and rhythm Abdomen:  Soft, non tender, no hepatosplenomegaly Pelvic:  External genitalia is normal in appearance, has scarring from HS and boils.  The vagina is normal in appearance. Urethra has no lesions or masses. The cervix is smooth.  Uterus is felt to be normal size, shape, and contour.  No adnexal masses or tenderness noted.Bladder is non tender, no masses felt. Rectal: Deferred at pt request  Extremities/musculoskeletal:  No swelling or varicosities noted, no clubbing or cyanosis Psych:  No mood changes, alert and cooperative,seems happy AA is 0    08/27/2023    8:41 AM 08/23/2022   11:36 AM 08/18/2021   10:19 AM  Depression screen PHQ 2/9  Decreased Interest 0 0 0  Down, Depressed, Hopeless 0 0 0  PHQ - 2 Score 0 0 0  Altered sleeping 0 0 0  Tired, decreased energy 0 0 0  Change in appetite 0 0 0  Feeling bad or failure about yourself  0 0 0  Trouble concentrating 0 0 0  Moving slowly or fidgety/restless 0 0 0  Suicidal thoughts 0 0 0  PHQ-9 Score 0 0 0  Difficult doing work/chores  Not difficult at all        08/27/2023    8:41 AM 08/23/2022   11:36 AM 08/18/2021   10:19 AM 08/16/2020   10:29 AM  GAD 7 : Generalized Anxiety Score  Nervous, Anxious, on Edge 0 0 0 0  Control/stop worrying 0 0 0 0  Worry too much - different  things 0 0 2 0  Trouble relaxing 0 0 0 0  Restless 0 0 0 0  Easily annoyed or irritable 1 0 1 0  Afraid - awful might happen 0 0 0 0  Total GAD 7 Score 1 0 3 0  Anxiety Difficulty  Not difficult at all  Not difficult at all    Upstream - 08/27/23 0840       Pregnancy Intention Screening   Does the patient want to become pregnant in the next year? N/A    Does the patient's partner want to become pregnant in the next year? N/A    Would the patient like to discuss contraceptive options today? N/A      Contraception Wrap Up   Current Method Abstinence   PM   End Method Abstinence   PM   Contraception Counseling Provided No               Examination chaperoned by Alphonso Aschoff LPN  Impression and plan: 1. Encounter for well woman exam with routine gynecological exam (Primary) Physical in 1 year Pap in 2027 Mammogram was negative 09/03/22 Colonoscopy per GI Labs with PCP Stay active   2. Hidradenitis suppurativa Has scarring  3. Essential hypertension, benign Continue BP meds and follow up with PCP

## 2023-08-29 ENCOUNTER — Telehealth: Payer: Self-pay

## 2023-08-29 NOTE — Telephone Encounter (Signed)
 Received VM from pt earlier this AM, requesting to speak w/ SW about needing help w/ electricity bill. Notified Nadiyah, SW, who states she will call pt today.

## 2023-08-29 NOTE — Telephone Encounter (Signed)
 [facility] Clinical Social Work  Clinical Social Work was referred by nurse for assessment of psychosocial needs.  Clinical Social Worker attempted to contact patient by phone to offer support and assess for needs.  Patient unable to hear CSW during first attempt. CSW attempted phone call again, no answer.  Maudie Sorrow, LCSW  Clinical Social Worker [facility]

## 2023-08-29 NOTE — Telephone Encounter (Signed)
 [facility] Clinical Social Work  Clinical Social Work was referred by self for assessment of psychosocial needs.  Clinical Social Worker contacted patient by phone to offer support and assess for needs.  Patient expressed need with utility bill. At time of call patient does not meet eligibility for assistance through [employer] or [employer]. Phone call disconnected prior to exploring community resources. No follow up scheduled.   Maudie Sorrow, LCSW  Clinical Social Worker [facility]

## 2023-09-07 ENCOUNTER — Other Ambulatory Visit: Payer: Self-pay | Admitting: Adult Health

## 2023-09-11 ENCOUNTER — Other Ambulatory Visit (HOSPITAL_COMMUNITY): Payer: Self-pay | Admitting: Adult Health

## 2023-09-11 DIAGNOSIS — Z1231 Encounter for screening mammogram for malignant neoplasm of breast: Secondary | ICD-10-CM

## 2023-09-12 ENCOUNTER — Ambulatory Visit (HOSPITAL_COMMUNITY)
Admission: RE | Admit: 2023-09-12 | Discharge: 2023-09-12 | Disposition: A | Discharge status: Home / Self Care | Source: Ambulatory Visit | Attending: Adult Health | Admitting: Adult Health

## 2023-09-12 DIAGNOSIS — Z1231 Encounter for screening mammogram for malignant neoplasm of breast: Secondary | ICD-10-CM | POA: Diagnosis present

## 2023-09-16 ENCOUNTER — Ambulatory Visit: Payer: Self-pay | Admitting: Adult Health

## 2023-09-18 ENCOUNTER — Other Ambulatory Visit: Payer: Self-pay | Admitting: Adult Health

## 2023-10-18 ENCOUNTER — Telehealth: Payer: Self-pay

## 2023-10-18 MED ORDER — SULFAMETHOXAZOLE-TRIMETHOPRIM 800-160 MG PO TABS: 1.0000 | ORAL_TABLET | Freq: Two times a day (BID) | ORAL | 0 refills | Status: DC

## 2023-10-18 NOTE — Telephone Encounter (Signed)
 Patient would like for Delon to call her in an antibiotic; her boils have come back again.

## 2023-10-18 NOTE — Telephone Encounter (Signed)
Rx sent for septra ds

## 2023-10-18 NOTE — Telephone Encounter (Signed)
 Pt has a boil and is requesting med. Thanks! JSY

## 2023-10-29 ENCOUNTER — Encounter (HOSPITAL_COMMUNITY): Payer: Self-pay

## 2023-10-29 ENCOUNTER — Emergency Department (HOSPITAL_COMMUNITY)

## 2023-10-29 ENCOUNTER — Emergency Department (HOSPITAL_COMMUNITY)
Admission: EM | Admit: 2023-10-29 | Discharge: 2023-10-29 | Disposition: A | Discharge status: Home / Self Care | Attending: Emergency Medicine | Admitting: Emergency Medicine

## 2023-10-29 ENCOUNTER — Other Ambulatory Visit: Payer: Self-pay

## 2023-10-29 DIAGNOSIS — R350 Frequency of micturition: Secondary | ICD-10-CM | POA: Diagnosis not present

## 2023-10-29 DIAGNOSIS — Z79899 Other long term (current) drug therapy: Secondary | ICD-10-CM | POA: Insufficient documentation

## 2023-10-29 DIAGNOSIS — I10 Essential (primary) hypertension: Secondary | ICD-10-CM | POA: Diagnosis not present

## 2023-10-29 DIAGNOSIS — R0789 Other chest pain: Secondary | ICD-10-CM | POA: Diagnosis present

## 2023-10-29 HISTORY — DX: Age-related osteoporosis without current pathological fracture: M81.0

## 2023-10-29 LAB — BASIC METABOLIC PANEL WITH GFR
Anion gap: 10 (ref 5–15)
BUN: 18 mg/dL (ref 8–23)
CO2: 25 mmol/L (ref 22–32)
Calcium: 9.4 mg/dL (ref 8.9–10.3)
Chloride: 101 mmol/L (ref 98–111)
Creatinine, Ser: 1.03 mg/dL — ABNORMAL HIGH (ref 0.44–1.00)
GFR, Estimated: >60 mL/min (ref >60)
Glucose, Bld: 101 mg/dL — ABNORMAL HIGH (ref 70–99)
Potassium: 4.1 mmol/L (ref 3.5–5.1)
Sodium: 136 mmol/L (ref 135–145)

## 2023-10-29 LAB — URINALYSIS, W/ REFLEX TO CULTURE (INFECTION SUSPECTED)
Bacteria, UA: NONE SEEN
Bilirubin Urine: NEGATIVE
Glucose, UA: NEGATIVE mg/dL
Ketones, ur: NEGATIVE mg/dL
Leukocytes,Ua: NEGATIVE
Nitrite: NEGATIVE
Protein, ur: NEGATIVE mg/dL
Specific Gravity, Urine: 1.011 (ref 1.005–1.030)
pH: 5 (ref 5.0–8.0)

## 2023-10-29 LAB — CBC WITH DIFFERENTIAL/PLATELET
Abs Immature Granulocytes: 0.02 K/uL (ref 0.00–0.07)
Basophils Absolute: 0.1 K/uL (ref 0.0–0.1)
Basophils Relative: 1 %
Eosinophils Absolute: 0.1 K/uL (ref 0.0–0.5)
Eosinophils Relative: 2 %
HCT: 34.6 % — ABNORMAL LOW (ref 36.0–46.0)
Hemoglobin: 12.1 g/dL (ref 12.0–15.0)
Immature Granulocytes: 0 %
Lymphocytes Relative: 51 %
Lymphs Abs: 4.2 K/uL — ABNORMAL HIGH (ref 0.7–4.0)
MCH: 29.9 pg (ref 26.0–34.0)
MCHC: 35 g/dL (ref 30.0–36.0)
MCV: 85.4 fL (ref 80.0–100.0)
Monocytes Absolute: 0.6 K/uL (ref 0.1–1.0)
Monocytes Relative: 7 %
Neutro Abs: 3.2 K/uL (ref 1.7–7.7)
Neutrophils Relative %: 39 %
Platelets: 383 K/uL (ref 150–400)
RBC: 4.05 MIL/uL (ref 3.87–5.11)
RDW: 13 % (ref 11.5–15.5)
WBC: 8.1 K/uL (ref 4.0–10.5)
nRBC: 0 % (ref 0.0–0.2)

## 2023-10-29 LAB — TROPONIN I (HIGH SENSITIVITY): Troponin I (High Sensitivity): 4 ng/L (ref <18)

## 2023-10-29 MED ORDER — ONDANSETRON 4 MG PO TBDP: 4.0000 mg | ORAL_TABLET | Freq: Three times a day (TID) | ORAL | 0 refills | Status: AC | PRN

## 2023-10-29 MED ORDER — ONDANSETRON HCL 4 MG/2ML IJ SOLN
4.0000 mg | Freq: Once | INTRAMUSCULAR | Status: AC
  Administered 2023-10-29: 4 mg via INTRAVENOUS
  Filled 2023-10-29: qty 2

## 2023-10-29 NOTE — ED Provider Notes (Signed)
 [place] EMERGENCY DEPARTMENT AT [facility]  Provider Note  CSN: [id number] Arrival date & time: 10/29/23 9580  History Chief Complaint  Patient presents with   Chest Pain    Amber Drake is a 63 y.o. female with history of HTN, GERD, remote B-cell lymphoma in remission x 6 years, reports about 6 hours of aching R upper chest pain. Not radiating, no SOB, cough or fever. Has had similar before.   She also reports 2 weeks of urinary frequency, no burning. Reports she has had microscopic hematuria in the past. No flank pain, fever or vomiting.    Home Medications Prior to Admission medications   Medication Sig Start Date End Date Taking? Authorizing Provider  ondansetron  (ZOFRAN -ODT) 4 MG disintegrating tablet Take 1 tablet (4 mg total) by mouth every 8 (eight) hours as needed for nausea or vomiting. 10/29/23  Yes Roselyn Carlin NOVAK, MD  atorvastatin  (LIPITOR) 20 MG tablet Take 1 tablet (20 mg total) by mouth daily. 03/14/15   Comer Kirsch, PA-C  gabapentin (NEURONTIN) 300 MG capsule Take 300 mg by mouth daily. 08/15/22   [provider]  lisinopril -hydrochlorothiazide  (PRINZIDE ,ZESTORETIC ) 20-12.5 MG tablet Take 1 tablet by mouth daily.    [provider]  meclizine  (ANTIVERT ) 25 MG tablet Take 1 tablet (25 mg total) by mouth 3 (three) times daily as needed for dizziness. 05/19/23   Ruthell Lonni FALCON, PA-C  methocarbamol  (ROBAXIN ) 500 MG tablet Take 1 tablet (500 mg total) by mouth every 8 (eight) hours as needed for muscle spasms. 08/17/23   Mesner, Jason, MD  Multiple Vitamin (MULTIVITAMIN WITH MINERALS) TABS tablet Take 1 tablet by mouth daily.    [provider]  oxyCODONE -acetaminophen  (PERCOCET) 10-325 MG tablet Take 1 tablet by mouth 3 (three) times daily as needed. 06/21/23   [provider]  pantoprazole  (PROTONIX ) 20 MG tablet Take 1 tablet (20 mg total) by mouth daily. 08/17/23   Mesner, Selinda, MD  potassium chloride  SA  (KLOR-CON ) 20 MEQ tablet TAKE (1) TABLET BY MOUTH TWICE DAILY. Patient taking differently: Take 20 mEq by mouth. 01/31/21   Onesimo Emaline Brink, MD  silver  sulfADIAZINE  (SILVADENE ) 1 % cream Apply topically daily. 09/10/23   Signa Delon LABOR, NP     Allergies    Penicillins, Penicillins cross reactors, and Tape   Review of Systems   Review of Systems Please see HPI for pertinent positives and negatives  Physical Exam BP (!) 117/49   Pulse 64   Temp 98.3 F (36.8 C) (Oral)   Resp 16   LMP 07/18/2010   SpO2 98%   Physical Exam Vitals and nursing note reviewed.  Constitutional:      Appearance: Normal appearance.  HENT:     Head: Normocephalic and atraumatic.     Nose: Nose normal.     Mouth/Throat:     Mouth: Mucous membranes are moist.  Eyes:     Extraocular Movements: Extraocular movements intact.     Conjunctiva/sclera: Conjunctivae normal.  Cardiovascular:     Rate and Rhythm: Normal rate.  Pulmonary:     Effort: Pulmonary effort is normal.     Breath sounds: Normal breath sounds.  Abdominal:     General: Abdomen is flat.     Palpations: Abdomen is soft.     Tenderness: There is no abdominal tenderness.  Musculoskeletal:        General: No swelling. Normal range of motion.     Cervical back: Neck supple.  Skin:  General: Skin is warm and dry.  Neurological:     General: No focal deficit present.     Mental Status: She is alert.  Psychiatric:        Mood and Affect: Mood normal.     ED Results / Procedures / Treatments   EKG EKG Interpretation Date/Time:  Tuesday October 29 2023 04:36:15 EDT Ventricular Rate:  63 PR Interval:  175 QRS Duration:  89 QT Interval:  387 QTC Calculation: 397 R Axis:   49  Text Interpretation: Sinus rhythm Atrial premature complex Anteroseptal infarct, old No significant change since last tracing Confirmed by Roselyn Dunnings [phone]) on 10/29/2023 4:43:28 AM  Procedures Procedures  Medications Ordered in the  ED Medications  ondansetron  (ZOFRAN ) injection 4 mg (4 mg Intravenous Given 10/29/23 0517)    Initial Impression and Plan  Patient here with atypical chest pain, ongoing for 6 hours. Will check labs including single Trop, CXR. Add UA for urinary frequency, could be medication related.   ED Course   Clinical Course as of 10/29/23 0550  Tue Oct 29, 2023  0511 BMP and Trop are normal.  [CS]  [phone] UA with blood but no infection [CS]  0538 CBC is normal. I personally viewed the images from radiology studies and agree with radiologist interpretation: CXR is clear.  [CS]  0548 Patient resting comfortable. Low risk for ACS with atypical symptoms and neg Trop after 6+ hours of pain. Recommend PCP follow up, RTED for any other concerns. She is requesting a Rx for zofran  for nausea.   [CS]    Clinical Course User Index [CS] Roselyn Dunnings NOVAK, MD     MDM Rules/Calculators/A&P Medical Decision Making Problems Addressed: Atypical chest pain: acute illness or injury Urinary frequency: acute illness or injury  Amount and/or Complexity of Data Reviewed Labs: ordered. Decision-making details documented in ED Course. Radiology: ordered and independent interpretation performed. Decision-making details documented in ED Course. ECG/medicine tests: ordered and independent interpretation performed. Decision-making details documented in ED Course.  Risk Prescription drug management.     Final Clinical Impression(s) / ED Diagnoses Final diagnoses:  Atypical chest pain  Urinary frequency    Rx / DC Orders ED Discharge Orders          Ordered    ondansetron  (ZOFRAN -ODT) 4 MG disintegrating tablet  Every 8 hours PRN        10/29/23 0550             Roselyn Dunnings NOVAK, MD 10/29/23 [phone]

## 2023-10-29 NOTE — ED Notes (Signed)
 Patient transported to X-ray

## 2023-10-29 NOTE — ED Triage Notes (Signed)
 Pt c/o centralized chest pain that started last night, denies SOB. Also c/o urinary frequency, reports she has been urinating every 2 hours x1 week.

## 2023-11-22 ENCOUNTER — Encounter: Payer: Self-pay | Admitting: Radiology

## 2023-11-25 ENCOUNTER — Telehealth: Payer: Self-pay | Admitting: Adult Health

## 2023-11-25 MED ORDER — SULFAMETHOXAZOLE-TRIMETHOPRIM 800-160 MG PO TABS: 1.0000 | ORAL_TABLET | Freq: Two times a day (BID) | ORAL | 0 refills | Status: DC

## 2023-11-25 NOTE — Telephone Encounter (Signed)
 Pt states she is in need of medication for her boils. Please advise.

## 2023-11-25 NOTE — Telephone Encounter (Signed)
 Rx sent in for septra ds

## 2023-11-25 NOTE — Addendum Note (Signed)
 Addended by: Arletta Lumadue A on: 11/25/2023 01:26 PM   Modules accepted: Orders

## 2023-11-25 NOTE — Telephone Encounter (Addendum)
 Pt states she has multiple boils and is needing an antibiotic. Pt states you have sent in med before without her having to be seen. Please advise. Thanks! JSY

## 2023-12-07 ENCOUNTER — Other Ambulatory Visit: Payer: Self-pay

## 2023-12-07 ENCOUNTER — Emergency Department (HOSPITAL_COMMUNITY)
Admission: EM | Admit: 2023-12-07 | Discharge: 2023-12-07 | Disposition: A | Discharge status: Home / Self Care | Attending: Emergency Medicine | Admitting: Emergency Medicine

## 2023-12-07 ENCOUNTER — Encounter (HOSPITAL_COMMUNITY): Payer: Self-pay

## 2023-12-07 DIAGNOSIS — R519 Headache, unspecified: Secondary | ICD-10-CM | POA: Diagnosis not present

## 2023-12-07 DIAGNOSIS — I10 Essential (primary) hypertension: Secondary | ICD-10-CM | POA: Insufficient documentation

## 2023-12-07 DIAGNOSIS — F17210 Nicotine dependence, cigarettes, uncomplicated: Secondary | ICD-10-CM | POA: Insufficient documentation

## 2023-12-07 DIAGNOSIS — Z79899 Other long term (current) drug therapy: Secondary | ICD-10-CM | POA: Diagnosis not present

## 2023-12-07 DIAGNOSIS — R42 Dizziness and giddiness: Secondary | ICD-10-CM | POA: Diagnosis present

## 2023-12-07 LAB — COMPREHENSIVE METABOLIC PANEL WITH GFR
ALT: 16 U/L (ref 0–44)
AST: 18 U/L (ref 15–41)
Albumin: 3.6 g/dL (ref 3.5–5.0)
Alkaline Phosphatase: 66 U/L (ref 38–126)
Anion gap: 14 (ref 5–15)
BUN: 11 mg/dL (ref 8–23)
CO2: 24 mmol/L (ref 22–32)
Calcium: 9.5 mg/dL (ref 8.9–10.3)
Chloride: 100 mmol/L (ref 98–111)
Creatinine, Ser: 0.79 mg/dL (ref 0.44–1.00)
GFR, Estimated: >60 mL/min (ref >60)
Glucose, Bld: 98 mg/dL (ref 70–99)
Potassium: 3.4 mmol/L — ABNORMAL LOW (ref 3.5–5.1)
Sodium: 138 mmol/L (ref 135–145)
Total Bilirubin: 0.3 mg/dL (ref 0.0–1.2)
Total Protein: 6.7 g/dL (ref 6.5–8.1)

## 2023-12-07 LAB — URINALYSIS, ROUTINE W REFLEX MICROSCOPIC
Bilirubin Urine: NEGATIVE
Glucose, UA: NEGATIVE mg/dL
Ketones, ur: NEGATIVE mg/dL
Leukocytes,Ua: NEGATIVE
Nitrite: NEGATIVE
Protein, ur: NEGATIVE mg/dL
Specific Gravity, Urine: 1.011 (ref 1.005–1.030)
pH: 5 (ref 5.0–8.0)

## 2023-12-07 LAB — CBC WITH DIFFERENTIAL/PLATELET
Abs Immature Granulocytes: 0.02 K/uL (ref 0.00–0.07)
Basophils Absolute: 0 K/uL (ref 0.0–0.1)
Basophils Relative: 0 %
Eosinophils Absolute: 0.1 K/uL (ref 0.0–0.5)
Eosinophils Relative: 2 %
HCT: 36.7 % (ref 36.0–46.0)
Hemoglobin: 12.3 g/dL (ref 12.0–15.0)
Immature Granulocytes: 0 %
Lymphocytes Relative: 44 %
Lymphs Abs: 3 K/uL (ref 0.7–4.0)
MCH: 28.5 pg (ref 26.0–34.0)
MCHC: 33.5 g/dL (ref 30.0–36.0)
MCV: 85 fL (ref 80.0–100.0)
Monocytes Absolute: 0.5 K/uL (ref 0.1–1.0)
Monocytes Relative: 7 %
Neutro Abs: 3.2 K/uL (ref 1.7–7.7)
Neutrophils Relative %: 47 %
Platelets: 400 K/uL (ref 150–400)
RBC: 4.32 MIL/uL (ref 3.87–5.11)
RDW: 12.7 % (ref 11.5–15.5)
WBC: 6.9 K/uL (ref 4.0–10.5)
nRBC: 0 % (ref 0.0–0.2)

## 2023-12-07 MED ORDER — NAPROXEN 250 MG PO TABS
500.0000 mg | ORAL_TABLET | Freq: Once | ORAL | Status: AC
  Administered 2023-12-07: 500 mg via ORAL
  Filled 2023-12-07: qty 2

## 2023-12-07 NOTE — ED Provider Notes (Signed)
 [place] EMERGENCY DEPARTMENT AT [facility] Provider Note   CSN: [id number] Arrival date & time: 12/07/23  1445     Patient presents with: Headache and Dizziness   Charisma Charlot is a 63 y.o. female.    Headache Associated symptoms: dizziness   Dizziness Associated symptoms: headaches    This patient is a very pleasant 63 year old female with a history of hypertension on lisinopril , she also takes potassium supplements, she presents to the hospital today with 2 complaints, 1 is that she feels like she is having some dizziness which she describes as a lightheadedness when she stands up too fast.  This occurs only occasionally, it is not every day, it has been going on for a while, there is no associated chest pain shortness of breath fevers chills numbness weakness blurred vision dysuria diarrhea swelling of the legs or abdominal pain or back pain.  She stated she is going to be checked to be sure.  Is not feeling that at this time and it is not associated with palpitations or shortness of breath    Prior to Admission medications   Medication Sig Start Date End Date Taking? Authorizing Provider  atorvastatin  (LIPITOR) 20 MG tablet Take 1 tablet (20 mg total) by mouth daily. 03/14/15   Comer Kirsch, PA-C  gabapentin (NEURONTIN) 300 MG capsule Take 300 mg by mouth daily. 08/15/22   [provider]  lisinopril -hydrochlorothiazide  (PRINZIDE ,ZESTORETIC ) 20-12.5 MG tablet Take 1 tablet by mouth daily.    [provider]  meclizine  (ANTIVERT ) 25 MG tablet Take 1 tablet (25 mg total) by mouth 3 (three) times daily as needed for dizziness. 05/19/23   Ruthell Lonni FALCON, PA-C  methocarbamol  (ROBAXIN ) 500 MG tablet Take 1 tablet (500 mg total) by mouth every 8 (eight) hours as needed for muscle spasms. 08/17/23   Mesner, Jason, MD  Multiple Vitamin (MULTIVITAMIN WITH MINERALS) TABS tablet Take 1 tablet by mouth daily.    [provider]  ondansetron   (ZOFRAN -ODT) 4 MG disintegrating tablet Take 1 tablet (4 mg total) by mouth every 8 (eight) hours as needed for nausea or vomiting. 10/29/23   Roselyn Carlin NOVAK, MD  oxyCODONE -acetaminophen  (PERCOCET) 10-325 MG tablet Take 1 tablet by mouth 3 (three) times daily as needed. 06/21/23   [provider]  pantoprazole  (PROTONIX ) 20 MG tablet Take 1 tablet (20 mg total) by mouth daily. 08/17/23   Mesner, Selinda, MD  potassium chloride  SA (KLOR-CON ) 20 MEQ tablet TAKE (1) TABLET BY MOUTH TWICE DAILY. Patient taking differently: Take 20 mEq by mouth. 01/31/21   Kale, Gautam Kishore, MD  silver  sulfADIAZINE  (SILVADENE ) 1 % cream Apply topically daily. 09/10/23   Signa Delon LABOR, NP  sulfamethoxazole -trimethoprim  (BACTRIM  DS) 800-160 MG tablet Take 1 tablet by mouth 2 (two) times daily. Take 1 bid 11/25/23   Signa Delon A, NP    Allergies: Penicillins, Penicillins cross reactors, and Tape    Review of Systems  Neurological:  Positive for dizziness and headaches.  All other systems reviewed and are negative.   Updated Vital Signs BP 139/70   Pulse 70   Temp 98.5 F (36.9 C)   Resp 16   Ht 1.676 m (5' 6)   Wt 72.1 kg   LMP 07/18/2010   SpO2 100%   BMI 25.66 kg/m   Physical Exam Vitals and nursing note reviewed.  Constitutional:      General: She is not in acute distress.    Appearance: She is well-developed.  HENT:  Head: Normocephalic and atraumatic.     Mouth/Throat:     Pharynx: No oropharyngeal exudate.  Eyes:     General: No scleral icterus.       Right eye: No discharge.        Left eye: No discharge.     Conjunctiva/sclera: Conjunctivae normal.     Pupils: Pupils are equal, round, and reactive to light.  Neck:     Thyroid: No thyromegaly.     Vascular: No JVD.  Cardiovascular:     Rate and Rhythm: Normal rate and regular rhythm.     Heart sounds: Normal heart sounds. No murmur heard.    No friction rub. No gallop.  Pulmonary:     Effort: Pulmonary  effort is normal. No respiratory distress.     Breath sounds: Normal breath sounds. No wheezing or rales.  Abdominal:     General: Bowel sounds are normal. There is no distension.     Palpations: Abdomen is soft. There is no mass.     Tenderness: There is no abdominal tenderness.  Musculoskeletal:        General: No tenderness. Normal range of motion.     Cervical back: Normal range of motion and neck supple.     Right lower leg: No edema.     Left lower leg: No edema.  Lymphadenopathy:     Cervical: No cervical adenopathy.  Skin:    General: Skin is warm and dry.     Findings: No erythema or rash.  Neurological:     Mental Status: She is alert.     Coordination: Coordination normal.     Comments: Speech is clear, cranial nerves III through XII are intact, memory is intact, strength is normal in all 4 extremities including grips and strength at the bilateral thighs, knees and ankles to extention and flexion, sensation is intact to light touch and pinprick in all 4 extremities. Coordination as tested by finger-nose-finger is normal, no limb ataxia. Normal gait, normal reflexes at the patellar tendons bilaterally  Psychiatric:        Behavior: Behavior normal.     (all labs ordered are listed, but only abnormal results are displayed) Labs Reviewed  URINALYSIS, ROUTINE W REFLEX MICROSCOPIC - Abnormal; Notable for the following components:      Result Value   Hgb urine dipstick MODERATE (*)    Bacteria, UA RARE (*)    All other components within normal limits  COMPREHENSIVE METABOLIC PANEL WITH GFR - Abnormal; Notable for the following components:   Potassium 3.4 (*)    All other components within normal limits  CBC WITH DIFFERENTIAL/PLATELET    EKG: EKG Interpretation Date/Time:  Saturday December 07 2023 18:20:32 EDT Ventricular Rate:  69 PR Interval:  161 QRS Duration:  89 QT Interval:  405 QTC Calculation: 434 R Axis:   74  Text Interpretation: Sinus rhythm  Anteroseptal infarct, age indeterminate Confirmed by Cleotilde Rogue ([id number]) on 12/07/2023 6:26:59 PM  Radiology: No results found.   Procedures   Medications Ordered in the ED  naproxen  (NAPROSYN ) tablet 500 mg (has no administration in time range)    Clinical Course as of 12/07/23 1835  Sat Dec 07, 2023  8166 Patient's urinalysis shows no significant findings of urinary tract infection, labs are totally unremarkable, she had 1 episode of feeling very lightheaded and when she was evaluated at the bedside she had an unremarkable and unchanged EKG and a normal blood pressure with no signs of arrhythmia.  This patient has no neurologic findings, I believe this is likely in some degree related to anxiety, she is stable for discharge, she was updated on her results [BM]    Clinical Course User Index [BM] Cleotilde Rogue, MD                                 Medical Decision Making Amount and/or Complexity of Data Reviewed Labs: ordered. ECG/medicine tests: ordered.  Risk Prescription drug management.    This patient presents to the ED for concern of dizziness - described as light headedness differential diagnosis includes could be electrolyte abnormalities, dehydration, could be cardiac cause though this does seem to be very positional suggesting that is a benign source.  The patient's vital signs are normal, there is no tachycardia, no fever, no hypotension and she is asymptomatic at this time, she has chronic microscopic hematuria for 30 years    Additional history obtained   Additional history obtained from Electronic Medical Record External records from outside source obtained and reviewed including prior medical records, office visits followed in the outpatient setting   Lab Tests:  I Ordered, and personally interpreted labs.  The pertinent results include: CBC and metabolic panel unremarkable, urinalysis with mild microscopic materia    Medicines ordered and prescription  drug management:  I ordered medication including Naprosyn     I have reviewed the patients home medicines and have made adjustments as needed   Problem List / ED Course:  Patient with lightheadedness, no arrhythmia seen, patient is very well-appearing   Social Determinants of Health:  Continues to smoke cigarettes, counseled   Patient updated on results, no signs of vertigo, no neurologic symptoms, no arrhythmias, patient even symptomatic with some lightheadedness during this time and there was nothing acute.  Patient stable for discharge       Final diagnoses:  Lightheadedness    ED Discharge Orders     None          Cleotilde Rogue, MD 12/07/23 [phone]

## 2023-12-07 NOTE — ED Provider Triage Note (Signed)
 Emergency Medicine Provider Triage Evaluation Note  Amber Drake , a 62 y.o. female  was evaluated in triage.  Pt complains of left shoulder pain.  Pt noticed blood in her urine. Pt reports she has had a headache and dizziness.  Pt reports she has vertigo  Review of Systems  Positive: Blood in urine Negative:   Physical Exam  BP 117/62   Pulse 70   Temp 98.5 F (36.9 C)   Resp 18   Ht 5' 6 (1.676 m)   Wt 72.1 kg   LMP 07/18/2010   SpO2 99%   BMI 25.66 kg/m  Gen:   Awake, no distress   Resp:  Normal effort  MSK:   Moves extremities without difficulty  Other:    Medical Decision Making  Medically screening exam initiated at 5:26 PM.  Appropriate orders placed.  Amber Drake was informed that the remainder of the evaluation will be completed by another provider, this initial triage assessment does not replace that evaluation, and the importance of remaining in the ED until their evaluation is complete.     Flint Sonny POUR, PA-C 12/07/23 1727

## 2023-12-07 NOTE — ED Triage Notes (Signed)
 Pt stated that she has been having a headache that radiates down her left shoulder. Pt also stated that she has been feeling dizzy as well and stated that she has a hx of vertigo

## 2023-12-07 NOTE — Discharge Instructions (Addendum)
 Thankfully your testing has been normal, there is no signs of anemia, no signs of significant findings that would cause you to be lightheaded or dizzy, there is no signs of heart attack, no signs of kidney failure, no signs of electrolyte abnormalities, it was very reassuring.  Please drink plenty of clear liquids and follow-up with your doctor this week for recheck, ER for worsening symptoms

## 2023-12-12 ENCOUNTER — Emergency Department (HOSPITAL_COMMUNITY)
Admission: EM | Admit: 2023-12-12 | Discharge: 2023-12-12 | Disposition: A | Discharge status: Home / Self Care | Attending: Emergency Medicine | Admitting: Emergency Medicine

## 2023-12-12 ENCOUNTER — Other Ambulatory Visit: Payer: Self-pay

## 2023-12-12 ENCOUNTER — Emergency Department (HOSPITAL_COMMUNITY)

## 2023-12-12 ENCOUNTER — Encounter (HOSPITAL_COMMUNITY): Payer: Self-pay

## 2023-12-12 DIAGNOSIS — R42 Dizziness and giddiness: Secondary | ICD-10-CM | POA: Insufficient documentation

## 2023-12-12 DIAGNOSIS — Z79899 Other long term (current) drug therapy: Secondary | ICD-10-CM | POA: Diagnosis not present

## 2023-12-12 LAB — BASIC METABOLIC PANEL WITH GFR
Anion gap: 12 (ref 5–15)
BUN: 16 mg/dL (ref 8–23)
CO2: 23 mmol/L (ref 22–32)
Calcium: 9.8 mg/dL (ref 8.9–10.3)
Chloride: 103 mmol/L (ref 98–111)
Creatinine, Ser: 0.72 mg/dL (ref 0.44–1.00)
GFR, Estimated: >60 mL/min (ref >60)
Glucose, Bld: 88 mg/dL (ref 70–99)
Potassium: 4.1 mmol/L (ref 3.5–5.1)
Sodium: 138 mmol/L (ref 135–145)

## 2023-12-12 LAB — CBC WITH DIFFERENTIAL/PLATELET
Abs Immature Granulocytes: 0.02 K/uL (ref 0.00–0.07)
Basophils Absolute: 0 K/uL (ref 0.0–0.1)
Basophils Relative: 0 %
Eosinophils Absolute: 0.1 K/uL (ref 0.0–0.5)
Eosinophils Relative: 1 %
HCT: 35.9 % — ABNORMAL LOW (ref 36.0–46.0)
Hemoglobin: 12 g/dL (ref 12.0–15.0)
Immature Granulocytes: 0 %
Lymphocytes Relative: 35 %
Lymphs Abs: 2.9 K/uL (ref 0.7–4.0)
MCH: 28.6 pg (ref 26.0–34.0)
MCHC: 33.4 g/dL (ref 30.0–36.0)
MCV: 85.7 fL (ref 80.0–100.0)
Monocytes Absolute: 0.5 K/uL (ref 0.1–1.0)
Monocytes Relative: 6 %
Neutro Abs: 4.9 K/uL (ref 1.7–7.7)
Neutrophils Relative %: 58 %
Platelets: 372 K/uL (ref 150–400)
RBC: 4.19 MIL/uL (ref 3.87–5.11)
RDW: 13 % (ref 11.5–15.5)
WBC: 8.4 K/uL (ref 4.0–10.5)
nRBC: 0 % (ref 0.0–0.2)

## 2023-12-12 LAB — CBG MONITORING, ED: Glucose-Capillary: 93 mg/dL (ref 70–99)

## 2023-12-12 MED ORDER — MECLIZINE HCL 25 MG PO TABS: 25.0000 mg | ORAL_TABLET | Freq: Three times a day (TID) | ORAL | 0 refills | Status: AC | PRN

## 2023-12-12 NOTE — Discharge Instructions (Signed)
 Follow-up with the ENT doctor as planned

## 2023-12-12 NOTE — ED Triage Notes (Addendum)
 Per pt she was recently seen for dizziness. She has taken anti vertigo med and meclizine . Dizziness comes and goes. Denies emesis, nausea, weakness, or syncopal episode. When standing she notices the dizziness more.

## 2023-12-14 NOTE — ED Provider Notes (Signed)
 [place] EMERGENCY DEPARTMENT AT [facility] Provider Note   CSN: [id number] Arrival date & time: 12/12/23  1525     Patient presents with: Dizziness   Amber Drake is a 63 y.o. female.   Patient complains of some dizziness.  Patient is supposed to follow-up with ENT  The history is provided by the patient and medical records.  Dizziness Quality:  Lightheadedness Severity:  Mild Onset quality:  Sudden Timing:  Intermittent Progression:  Waxing and waning Chronicity:  New Context: not when bending over   Relieved by:  Nothing Worsened by:  Nothing Associated symptoms: no chest pain, no diarrhea and no headaches        Prior to Admission medications   Medication Sig Start Date End Date Taking? Authorizing Provider  meclizine  (ANTIVERT ) 25 MG tablet Take 1 tablet (25 mg total) by mouth 3 (three) times daily as needed for dizziness. 12/12/23  Yes Suzette Pac, MD  atorvastatin  (LIPITOR) 20 MG tablet Take 1 tablet (20 mg total) by mouth daily. 03/14/15   Comer Kirsch, PA-C  gabapentin (NEURONTIN) 300 MG capsule Take 300 mg by mouth daily. 08/15/22   [provider]  lisinopril -hydrochlorothiazide  (PRINZIDE ,ZESTORETIC ) 20-12.5 MG tablet Take 1 tablet by mouth daily.    [provider]  methocarbamol  (ROBAXIN ) 500 MG tablet Take 1 tablet (500 mg total) by mouth every 8 (eight) hours as needed for muscle spasms. 08/17/23   Mesner, Jason, MD  Multiple Vitamin (MULTIVITAMIN WITH MINERALS) TABS tablet Take 1 tablet by mouth daily.    [provider]  ondansetron  (ZOFRAN -ODT) 4 MG disintegrating tablet Take 1 tablet (4 mg total) by mouth every 8 (eight) hours as needed for nausea or vomiting. 10/29/23   Roselyn Carlin NOVAK, MD  oxyCODONE -acetaminophen  (PERCOCET) 10-325 MG tablet Take 1 tablet by mouth 3 (three) times daily as needed. 06/21/23   [provider]  pantoprazole  (PROTONIX ) 20 MG tablet Take 1 tablet (20 mg total) by mouth  daily. 08/17/23   Mesner, Selinda, MD  potassium chloride  SA (KLOR-CON ) 20 MEQ tablet TAKE (1) TABLET BY MOUTH TWICE DAILY. Patient taking differently: Take 20 mEq by mouth. 01/31/21   Onesimo Emaline Brink, MD  silver  sulfADIAZINE  (SILVADENE ) 1 % cream Apply topically daily. 09/10/23   Signa Delon LABOR, NP  sulfamethoxazole -trimethoprim  (BACTRIM  DS) 800-160 MG tablet Take 1 tablet by mouth 2 (two) times daily. Take 1 bid 11/25/23   Signa Delon A, NP    Allergies: Penicillins, Penicillins cross reactors, and Tape    Review of Systems  Constitutional:  Negative for appetite change and fatigue.  HENT:  Negative for congestion, ear discharge and sinus pressure.   Eyes:  Negative for discharge.  Respiratory:  Negative for cough.   Cardiovascular:  Negative for chest pain.  Gastrointestinal:  Negative for abdominal pain and diarrhea.  Genitourinary:  Negative for frequency and hematuria.  Musculoskeletal:  Negative for back pain.  Skin:  Negative for rash.  Neurological:  Positive for dizziness. Negative for seizures and headaches.  Psychiatric/Behavioral:  Negative for hallucinations.     Updated Vital Signs BP 106/63   Pulse 71   Temp 98.7 F (37.1 C) (Oral)   Resp 17   Ht 5' 4 (1.626 m)   Wt 72.6 kg   LMP 07/18/2010   SpO2 96%   BMI 27.46 kg/m   Physical Exam Vitals and nursing note reviewed.  Constitutional:      Appearance: She is well-developed.  HENT:     Head: Normocephalic.  Nose: Nose normal.  Eyes:     General: No scleral icterus.    Conjunctiva/sclera: Conjunctivae normal.  Neck:     Thyroid: No thyromegaly.  Cardiovascular:     Rate and Rhythm: Normal rate and regular rhythm.     Heart sounds: No murmur heard.    No friction rub. No gallop.  Pulmonary:     Breath sounds: No stridor. No wheezing or rales.  Chest:     Chest wall: No tenderness.  Abdominal:     General: There is no distension.     Tenderness: There is no abdominal tenderness. There  is no rebound.  Musculoskeletal:        General: Normal range of motion.     Cervical back: Neck supple.  Lymphadenopathy:     Cervical: No cervical adenopathy.  Skin:    Findings: No erythema or rash.  Neurological:     Mental Status: She is alert and oriented to person, place, and time.     Motor: No abnormal muscle tone.     Coordination: Coordination normal.  Psychiatric:        Behavior: Behavior normal.     (all labs ordered are listed, but only abnormal results are displayed) Labs Reviewed  CBC WITH DIFFERENTIAL/PLATELET - Abnormal; Notable for the following components:      Result Value   HCT 35.9 (*)    All other components within normal limits  BASIC METABOLIC PANEL WITH GFR  CBG MONITORING, ED    EKG: None  Radiology: CT Head Wo Contrast Result Date: 12/12/2023 CLINICAL DATA:  Headache dizziness EXAM: CT HEAD WITHOUT CONTRAST TECHNIQUE: Contiguous axial images were obtained from the base of the skull through the vertex without intravenous contrast. RADIATION DOSE REDUCTION: This exam was performed according to the departmental dose-optimization program which includes automated exposure control, adjustment of the mA and/or kV according to patient size and/or use of iterative reconstruction technique. COMPARISON:  MRI 05/19/2023, CT brain 07/29/2018 FINDINGS: Brain: No acute territorial infarction or hemorrhage. 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 Appears slightly more smudgy, probably due to mild artifact. No ventricular enlargement. Minimal encephalomalacia in the right cerebellum underlying the craniotomy. Vascular: No hyperdense vessels.  Carotid vascular calcification Skull: No fracture.  Right occipital craniotomy/craniectomy Sinuses/Orbits: No acute finding. Other: None IMPRESSION: 1. No CT evidence for acute intracranial abnormality. 2. 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 3. Right occipital craniotomy with minimal encephalomalacia in  the right cerebellum. Electronically Signed   By: Luke Bun M.D.   On: 12/12/2023 17:27     Procedures   Medications Ordered in the ED - No data to display           Patient with dizziness.  She was placed on meclizine  and follow-up with ENT                               Medical Decision Making Amount and/or Complexity of Data Reviewed Labs: ordered. Radiology: ordered.     Final diagnoses:  Dizziness    ED Discharge Orders          Ordered    meclizine  (ANTIVERT ) 25 MG tablet  3 times daily PRN        12/12/23 1919               Suzette Pac, MD 12/14/23 1128

## 2024-01-28 ENCOUNTER — Emergency Department (HOSPITAL_COMMUNITY)
Admission: EM | Admit: 2024-01-28 | Discharge: 2024-01-28 | Disposition: A | Discharge status: Home / Self Care | Attending: Emergency Medicine | Admitting: Emergency Medicine

## 2024-01-28 ENCOUNTER — Encounter (HOSPITAL_COMMUNITY): Payer: Self-pay

## 2024-01-28 ENCOUNTER — Other Ambulatory Visit: Payer: Self-pay

## 2024-01-28 DIAGNOSIS — R1010 Upper abdominal pain, unspecified: Secondary | ICD-10-CM | POA: Insufficient documentation

## 2024-01-28 DIAGNOSIS — L732 Hidradenitis suppurativa: Secondary | ICD-10-CM | POA: Diagnosis present

## 2024-01-28 DIAGNOSIS — R110 Nausea: Secondary | ICD-10-CM | POA: Diagnosis not present

## 2024-01-28 LAB — CBC WITH DIFFERENTIAL/PLATELET
Abs Immature Granulocytes: 0.03 K/uL (ref 0.00–0.07)
Basophils Absolute: 0 K/uL (ref 0.0–0.1)
Basophils Relative: 0 %
Eosinophils Absolute: 0.1 K/uL (ref 0.0–0.5)
Eosinophils Relative: 1 %
HCT: 37.5 % (ref 36.0–46.0)
Hemoglobin: 12.4 g/dL (ref 12.0–15.0)
Immature Granulocytes: 0 %
Lymphocytes Relative: 34 %
Lymphs Abs: 2.7 K/uL (ref 0.7–4.0)
MCH: 28.7 pg (ref 26.0–34.0)
MCHC: 33.1 g/dL (ref 30.0–36.0)
MCV: 86.8 fL (ref 80.0–100.0)
Monocytes Absolute: 0.5 K/uL (ref 0.1–1.0)
Monocytes Relative: 6 %
Neutro Abs: 4.6 K/uL (ref 1.7–7.7)
Neutrophils Relative %: 59 %
Platelets: 381 K/uL (ref 150–400)
RBC: 4.32 MIL/uL (ref 3.87–5.11)
RDW: 13.2 % (ref 11.5–15.5)
WBC: 8 K/uL (ref 4.0–10.5)
nRBC: 0 % (ref 0.0–0.2)

## 2024-01-28 LAB — URINALYSIS, ROUTINE W REFLEX MICROSCOPIC
Bacteria, UA: NONE SEEN
Bilirubin Urine: NEGATIVE
Glucose, UA: NEGATIVE mg/dL
Ketones, ur: NEGATIVE mg/dL
Leukocytes,Ua: NEGATIVE
Nitrite: NEGATIVE
Protein, ur: NEGATIVE mg/dL
Specific Gravity, Urine: 1.012 (ref 1.005–1.030)
pH: 6 (ref 5.0–8.0)

## 2024-01-28 LAB — COMPREHENSIVE METABOLIC PANEL WITH GFR
ALT: 11 U/L (ref 0–44)
AST: 17 U/L (ref 15–41)
Albumin: 4.4 g/dL (ref 3.5–5.0)
Alkaline Phosphatase: 75 U/L (ref 38–126)
Anion gap: 10 (ref 5–15)
BUN: 8 mg/dL (ref 8–23)
CO2: 30 mmol/L (ref 22–32)
Calcium: 10.2 mg/dL (ref 8.9–10.3)
Chloride: 103 mmol/L (ref 98–111)
Creatinine, Ser: 0.69 mg/dL (ref 0.44–1.00)
GFR, Estimated: >60 mL/min (ref >60)
Glucose, Bld: 93 mg/dL (ref 70–99)
Potassium: 3.9 mmol/L (ref 3.5–5.1)
Sodium: 142 mmol/L (ref 135–145)
Total Bilirubin: 0.3 mg/dL (ref 0.0–1.2)
Total Protein: 7 g/dL (ref 6.5–8.1)

## 2024-01-28 LAB — LIPASE, BLOOD: Lipase: 17 U/L (ref 11–51)

## 2024-01-28 MED ORDER — SIMETHICONE 80 MG PO CHEW
160.0000 mg | CHEWABLE_TABLET | Freq: Once | ORAL | Status: AC
  Administered 2024-01-28: 160 mg via ORAL
  Filled 2024-01-28: qty 2

## 2024-01-28 MED ORDER — SIMETHICONE 125 MG PO CAPS: ORAL_CAPSULE | ORAL | 1 refills | Status: AC

## 2024-01-28 NOTE — ED Notes (Signed)
 Fortunately pt appears in no distress at this time, she is asking for a sandwich and cookies, advised that we are waiting on labs to result.

## 2024-01-28 NOTE — ED Notes (Signed)
 Pt given gown and blanket and advised to change from waist down to allow assessment by MD

## 2024-01-28 NOTE — ED Provider Notes (Signed)
 [place] EMERGENCY DEPARTMENT AT [facility] Provider Note   CSN: [id number] Arrival date & time: 01/28/24  1100     Patient presents with: Abscess   Amber Drake is a 63 y.o. female.    Abscess Associated symptoms: nausea   Associated symptoms: no fever and no vomiting        Amber Drake is a 63 y.o. female who presents to the Emergency Department complaining of waxing and waning pressure to her upper abdomen associated with nausea.  Symptoms began after starting doxycycline  for recurrent abscess.  This has been occurring for few days.  She denies any vomiting or diarrhea.  No specific abdominal pain.  She does describes it as a wave of fullness across her upper abdomen that spontaneously resolves after several minutes.  She does endorse increased flatus.  No chest pain or shortness of breath.  She has been eating well and denies any decreased appetite, no fever or chills.  She was seen recently and prescribed doxycycline  for her abscess 4 days ago.  She denies any increasing pain at the site, increased redness or swelling of the site.     Prior to Admission medications   Medication Sig Start Date End Date Taking? Authorizing Provider  atorvastatin  (LIPITOR) 20 MG tablet Take 1 tablet (20 mg total) by mouth daily. 03/14/15   Comer Kirsch, PA-C  gabapentin (NEURONTIN) 300 MG capsule Take 300 mg by mouth daily. 08/15/22   [provider]  lisinopril -hydrochlorothiazide  (PRINZIDE ,ZESTORETIC ) 20-12.5 MG tablet Take 1 tablet by mouth daily.    [provider]  meclizine  (ANTIVERT ) 25 MG tablet Take 1 tablet (25 mg total) by mouth 3 (three) times daily as needed for dizziness. 12/12/23   Zammit, Joseph, MD  methocarbamol  (ROBAXIN ) 500 MG tablet Take 1 tablet (500 mg total) by mouth every 8 (eight) hours as needed for muscle spasms. 08/17/23   Mesner, Jason, MD  Multiple Vitamin (MULTIVITAMIN WITH MINERALS) TABS tablet Take 1 tablet by mouth daily.     [provider]  ondansetron  (ZOFRAN -ODT) 4 MG disintegrating tablet Take 1 tablet (4 mg total) by mouth every 8 (eight) hours as needed for nausea or vomiting. 10/29/23   Roselyn Carlin NOVAK, MD  oxyCODONE -acetaminophen  (PERCOCET) 10-325 MG tablet Take 1 tablet by mouth 3 (three) times daily as needed. 06/21/23   [provider]  pantoprazole  (PROTONIX ) 20 MG tablet Take 1 tablet (20 mg total) by mouth daily. 08/17/23   Mesner, Selinda, MD  potassium chloride  SA (KLOR-CON ) 20 MEQ tablet TAKE (1) TABLET BY MOUTH TWICE DAILY. Patient taking differently: Take 20 mEq by mouth. 01/31/21   Onesimo Emaline Brink, MD  silver  sulfADIAZINE  (SILVADENE ) 1 % cream Apply topically daily. 09/10/23   Signa Delon LABOR, NP  sulfamethoxazole -trimethoprim  (BACTRIM  DS) 800-160 MG tablet Take 1 tablet by mouth 2 (two) times daily. Take 1 bid 11/25/23   Signa Delon A, NP    Allergies: Penicillins, Penicillins cross reactors, and Tape    Review of Systems  Constitutional:  Negative for appetite change and fever.  HENT:  Negative for sore throat.   Respiratory:  Negative for shortness of breath.   Cardiovascular:  Negative for chest pain.  Gastrointestinal:  Positive for abdominal distention and nausea. Negative for diarrhea and vomiting.  Musculoskeletal:  Negative for arthralgias and back pain.  Neurological:  Negative for weakness and numbness.    Updated Vital Signs BP 137/77   Pulse 63   Temp 98.4 F (36.9 C) (Oral)  Resp 17   Ht 5' 4 (1.626 m)   Wt 72.6 kg   LMP 07/18/2010   SpO2 99%   BMI 27.47 kg/m   Physical Exam Vitals and nursing note reviewed.  Constitutional:      Appearance: Normal appearance.  Cardiovascular:     Rate and Rhythm: Normal rate and regular rhythm.     Pulses: Normal pulses.  Pulmonary:     Effort: Pulmonary effort is normal.  Abdominal:     General: There is no distension.     Palpations: Abdomen is soft. There is no mass.     Tenderness: There  is no abdominal tenderness. There is no guarding or rebound.  Musculoskeletal:        General: Normal range of motion.  Skin:    General: Skin is warm.  Neurological:     General: No focal deficit present.     Mental Status: She is alert.     (all labs ordered are listed, but only abnormal results are displayed) Labs Reviewed  URINALYSIS, ROUTINE W REFLEX MICROSCOPIC - Abnormal; Notable for the following components:      Result Value   Hgb urine dipstick MODERATE (*)    All other components within normal limits  CBC WITH DIFFERENTIAL/PLATELET  COMPREHENSIVE METABOLIC PANEL WITH GFR  LIPASE, BLOOD    EKG: None  Radiology: No results found.   Procedures   Medications Ordered in the ED  simethicone  [facility]) chewable tablet 160 mg (160 mg Oral Given 01/28/24 1337)                                    Medical Decision Making   Patient here for evaluation of sensation of fullness of her upper abdomen for several days.  She began antibiotic for an abscess of her right groin area.  She denies having any nausea or vomiting fever or chills.  No diarrhea or constipation.  States she is having regular bowel movements.  No recent abdominal surgeries and she states her appetite is good.  Patient is nontoxic-appearing on exam and has reassuring vital signs.  I do not appreciate any abdominal distention or peritoneal signs on exam.  Doubt acute abdominal process.  Possibly related to gas.  Will try simethicone  and check labs  Amount and/or Complexity of Data Reviewed Labs: ordered.    Details: Labs unremarkable Discussion of management or test interpretation with external provider(s):   On recheck, patient resting comfortably.  Endorses some improvement of her symptoms after simethicone .  Possibly GI upset secondary to her doxycycline  use.  She has what appears to be hidradenitis of the right groin area she has been seen here several times for same.  She has a reassuring abdominal  exam and I have low clinical suspicion for acute abdominal process or need for abdominal imaging at this time.  Will have her try simethicone  for symptomatic relief.  She is able to tolerate oral fluid trial here without difficulty.  She appears appropriate for discharge home and strict return precautions were given  Risk OTC drugs.        Final diagnoses:  Hidradenitis    ED Discharge Orders     None          Herlinda Milling, PA-C 01/31/24 1357    Melvenia Motto, MD 02/03/24 2247

## 2024-01-28 NOTE — Discharge Instructions (Signed)
 Continue taking your antibiotics as directed.  Warm wet compresses to the area may also help.  I have prescribed medication that you can take as directed to help with increased gas.  Please follow-up with your primary care provider for recheck return to the emergency department if you develop any new or worsening symptoms

## 2024-01-28 NOTE — ED Triage Notes (Signed)
 Pt arrived via POV c/o recurrent abscess in her right groin and reports being on an antibiotic for this, but reports now she is developing gas and an upset stomach.

## 2024-01-29 ENCOUNTER — Encounter (HOSPITAL_COMMUNITY): Payer: Self-pay | Admitting: Emergency Medicine

## 2024-01-29 ENCOUNTER — Emergency Department (HOSPITAL_COMMUNITY)
Admission: EM | Admit: 2024-01-29 | Discharge: 2024-01-29 | Disposition: A | Discharge status: Home / Self Care | Attending: Emergency Medicine | Admitting: Emergency Medicine

## 2024-01-29 DIAGNOSIS — I10 Essential (primary) hypertension: Secondary | ICD-10-CM | POA: Diagnosis not present

## 2024-01-29 DIAGNOSIS — Z79899 Other long term (current) drug therapy: Secondary | ICD-10-CM | POA: Insufficient documentation

## 2024-01-29 DIAGNOSIS — R140 Abdominal distension (gaseous): Secondary | ICD-10-CM | POA: Insufficient documentation

## 2024-01-29 DIAGNOSIS — R109 Unspecified abdominal pain: Secondary | ICD-10-CM | POA: Diagnosis present

## 2024-01-29 DIAGNOSIS — Z85828 Personal history of other malignant neoplasm of skin: Secondary | ICD-10-CM | POA: Diagnosis not present

## 2024-01-29 NOTE — ED Notes (Signed)
 ED Provider at bedside.

## 2024-01-29 NOTE — ED Triage Notes (Signed)
 Pt states she was here yesterday for abd pain and constipation and is here today for the same, pt states she does take iron pills. Also states she took a laxitive the other day and it helped. LBM was yesterday and states it was normal. States abd pain isnt so much pain but more so gas

## 2024-01-29 NOTE — Discharge Instructions (Signed)
 You were seen today for gassiness.  As long as you are having bowel movements and not vomiting, this is sometimes related to food or medications.  Iron pills can change your stools and stool patterns.  Follow-up with your primary doctor.

## 2024-01-29 NOTE — ED Provider Notes (Signed)
 [place] EMERGENCY DEPARTMENT AT [facility] Provider Note   CSN: [id number] Arrival date & time: 01/29/24  9389     Patient presents with: Abdominal Pain   Amber Drake is a 63 y.o. female.   HPI     This is a 63 year old female who presents with gassiness.  Was seen and evaluated yesterday.  She states that she was seen saline.  She states over the last 2 weeks she has had increased gassy feeling in her abdomen.  She thought she was constipated but had a normal stool yesterday.  No nausea or vomiting.  No fevers.  She did start iron pills last month and is unsure whether this may have made a difference.  She has noticed a change in her stools.  Prior to Admission medications   Medication Sig Start Date End Date Taking? Authorizing Provider  atorvastatin  (LIPITOR) 20 MG tablet Take 1 tablet (20 mg total) by mouth daily. 03/14/15   Comer Kirsch, PA-C  gabapentin (NEURONTIN) 300 MG capsule Take 300 mg by mouth daily. 08/15/22   [provider]  lisinopril -hydrochlorothiazide  (PRINZIDE ,ZESTORETIC ) 20-12.5 MG tablet Take 1 tablet by mouth daily.    [provider]  meclizine  (ANTIVERT ) 25 MG tablet Take 1 tablet (25 mg total) by mouth 3 (three) times daily as needed for dizziness. 12/12/23   Suzette Pac, MD  methocarbamol  (ROBAXIN ) 500 MG tablet Take 1 tablet (500 mg total) by mouth every 8 (eight) hours as needed for muscle spasms. 08/17/23   Mesner, Jason, MD  Multiple Vitamin (MULTIVITAMIN WITH MINERALS) TABS tablet Take 1 tablet by mouth daily.    [provider]  ondansetron  (ZOFRAN -ODT) 4 MG disintegrating tablet Take 1 tablet (4 mg total) by mouth every 8 (eight) hours as needed for nausea or vomiting. 10/29/23   Roselyn Carlin NOVAK, MD  oxyCODONE -acetaminophen  (PERCOCET) 10-325 MG tablet Take 1 tablet by mouth 3 (three) times daily as needed. 06/21/23   [provider]  pantoprazole  (PROTONIX ) 20 MG tablet Take 1 tablet (20 mg  total) by mouth daily. 08/17/23   Mesner, Selinda, MD  potassium chloride  SA (KLOR-CON ) 20 MEQ tablet TAKE (1) TABLET BY MOUTH TWICE DAILY. Patient taking differently: Take 20 mEq by mouth. 01/31/21   Onesimo Emaline Brink, MD  silver  sulfADIAZINE  (SILVADENE ) 1 % cream Apply topically daily. 09/10/23   Signa Delon LABOR, NP  Simethicone  125 MG CAPS One tablet 4 times a day as needed 01/28/24   Triplett, Tammy, PA-C  sulfamethoxazole -trimethoprim  (BACTRIM  DS) 800-160 MG tablet Take 1 tablet by mouth 2 (two) times daily. Take 1 bid 11/25/23   Signa Delon A, NP    Allergies: Penicillins, Penicillins cross reactors, and Tape    Review of Systems  Constitutional:  Negative for fever.  Respiratory:  Negative for shortness of breath.   Cardiovascular:  Negative for chest pain.  Gastrointestinal:  Negative for abdominal pain, diarrhea, nausea and vomiting.       Gassiness  All other systems reviewed and are negative.   Updated Vital Signs BP (!) 149/75 (BP Location: Left Arm)   Pulse 72   Temp 98.4 F (36.9 C) (Oral)   Resp 18   LMP 07/18/2010   SpO2 98%   Physical Exam Vitals and nursing note reviewed.  Constitutional:      Appearance: She is well-developed. She is not ill-appearing.  HENT:     Head: Normocephalic and atraumatic.  Eyes:     Pupils: Pupils are equal, round, and reactive to  light.  Cardiovascular:     Rate and Rhythm: Normal rate and regular rhythm.     Heart sounds: Normal heart sounds.  Pulmonary:     Effort: Pulmonary effort is normal. No respiratory distress.     Breath sounds: No wheezing.  Abdominal:     General: Bowel sounds are normal.     Palpations: Abdomen is soft.     Tenderness: There is no abdominal tenderness. There is no guarding or rebound.  Musculoskeletal:     Cervical back: Neck supple.  Skin:    General: Skin is warm and dry.  Neurological:     Mental Status: She is alert and oriented to person, place, and time.     (all labs  ordered are listed, but only abnormal results are displayed) Labs Reviewed - No data to display  EKG: None  Radiology: No results found.   Procedures   Medications Ordered in the ED - No data to display                                  Medical Decision Making  This patient presents to the ED for concern of abdominal gassiness, this involves an extensive number of treatment options, and is a complaint that carries with it a high risk of complications and morbidity.  I considered the following differential and admission for this acute, potentially life threatening condition.  The differential diagnosis includes obstruction, gastritis, gastroenteritis, constipation, IBS, IBD, food or medication related side effects  MDM:    This is a 63 year old female who presents with gassiness.  Was seen for the same yesterday and had lab work that was reassuring.  She believes this may be related to taking her iron pills.  Her abdomen is benign.  Has not had any nausea or vomiting.  Reports normal bowel movement after taking a laxative.  Discussed with the patient that taking iron can in fact change your stool patterns.  With a benign abdominal exam, and labs yesterday were reassuring, would not pursue imaging at this time.  Patient is agreeable to this.  She states that she wants to stop her iron pill and follow-up with her primary doctor.  Low suspicion for obstructive pathology or other acute emergent process.  (Labs, imaging, consults)  Labs: I Ordered, and personally interpreted labs.  The pertinent results include: Labs reviewed from yesterday  Imaging Studies ordered: I ordered imaging studies including none I independently visualized and interpreted imaging. I agree with the radiologist interpretation  Additional history obtained from chart review.  External records from outside source obtained and reviewed including prior evaluations  Cardiac Monitoring: The patient was maintained on a  cardiac monitor.  If on the cardiac monitor, I personally viewed and interpreted the cardiac monitored which showed an underlying rhythm of: Sinus  Reevaluation: After the interventions noted above, I reevaluated the patient and found that they have :stayed the same  Social Determinants of Health:  lives independently  Disposition: Discharge  Co morbidities that complicate the patient evaluation  Past Medical History:  Diagnosis Date   Anemia    Arthritis    Blood dyscrasia    sickle cell trait   Cancer (HCC)    lymphoma   Carbuncle and furuncle    Hypercholesterolemia    Hypertension    does not take meds   Osteoporosis    Papanicolaou smear of cervix with positive high risk human papilloma  virus (HPV) test 08/23/2020   08/23/20    [place] per ASCCP guidelines, immediate risk of CIN 3+ is 4.1 %   Sickle cell trait      Medicines No orders of the defined types were placed in this encounter.   I have reviewed the patients home medicines and have made adjustments as needed  Problem List / ED Course: Problem List Items Addressed This Visit   None Visit Diagnoses       Abdominal bloating    -  Primary                Final diagnoses:  Abdominal bloating    ED Discharge Orders     None          Bari Charmaine FALCON, MD 01/29/24 [phone]

## 2024-02-03 ENCOUNTER — Encounter: Payer: Self-pay | Admitting: Radiology

## 2024-02-18 ENCOUNTER — Encounter (HOSPITAL_COMMUNITY): Payer: Self-pay

## 2024-02-18 ENCOUNTER — Emergency Department (HOSPITAL_COMMUNITY)

## 2024-02-18 ENCOUNTER — Emergency Department (HOSPITAL_COMMUNITY)
Admission: EM | Admit: 2024-02-18 | Discharge: 2024-02-18 | Disposition: A | Discharge status: Home / Self Care | Source: Skilled Nursing Facility | Attending: Emergency Medicine | Admitting: Emergency Medicine

## 2024-02-18 ENCOUNTER — Other Ambulatory Visit: Payer: Self-pay

## 2024-02-18 DIAGNOSIS — M545 Low back pain, unspecified: Secondary | ICD-10-CM | POA: Diagnosis present

## 2024-02-18 DIAGNOSIS — I10 Essential (primary) hypertension: Secondary | ICD-10-CM | POA: Diagnosis not present

## 2024-02-18 DIAGNOSIS — Z79899 Other long term (current) drug therapy: Secondary | ICD-10-CM | POA: Diagnosis not present

## 2024-02-18 HISTORY — DX: Other intervertebral disc degeneration, lumbar region without mention of lumbar back pain or lower extremity pain: M51.369

## 2024-02-18 LAB — COMPREHENSIVE METABOLIC PANEL WITH GFR
ALT: 10 U/L (ref 0–44)
AST: 18 U/L (ref 15–41)
Albumin: 4.1 g/dL (ref 3.5–5.0)
Alkaline Phosphatase: 92 U/L (ref 38–126)
Anion gap: 10 (ref 5–15)
BUN: 6 mg/dL — ABNORMAL LOW (ref 8–23)
CO2: 29 mmol/L (ref 22–32)
Calcium: 9.8 mg/dL (ref 8.9–10.3)
Chloride: 102 mmol/L (ref 98–111)
Creatinine, Ser: 0.57 mg/dL (ref 0.44–1.00)
GFR, Estimated: >60 mL/min (ref >60)
Glucose, Bld: 82 mg/dL (ref 70–99)
Potassium: 4 mmol/L (ref 3.5–5.1)
Sodium: 141 mmol/L (ref 135–145)
Total Bilirubin: 0.3 mg/dL (ref 0.0–1.2)
Total Protein: 7.1 g/dL (ref 6.5–8.1)

## 2024-02-18 LAB — CBC WITH DIFFERENTIAL/PLATELET
Abs Immature Granulocytes: 0.01 K/uL (ref 0.00–0.07)
Basophils Absolute: 0 K/uL (ref 0.0–0.1)
Basophils Relative: 0 %
Eosinophils Absolute: 0.1 K/uL (ref 0.0–0.5)
Eosinophils Relative: 1 %
HCT: 33.8 % — ABNORMAL LOW (ref 36.0–46.0)
Hemoglobin: 11.3 g/dL — ABNORMAL LOW (ref 12.0–15.0)
Immature Granulocytes: 0 %
Lymphocytes Relative: 26 %
Lymphs Abs: 2.6 K/uL (ref 0.7–4.0)
MCH: 28.7 pg (ref 26.0–34.0)
MCHC: 33.4 g/dL (ref 30.0–36.0)
MCV: 85.8 fL (ref 80.0–100.0)
Monocytes Absolute: 0.6 K/uL (ref 0.1–1.0)
Monocytes Relative: 6 %
Neutro Abs: 6.6 K/uL (ref 1.7–7.7)
Neutrophils Relative %: 67 %
Platelets: 408 K/uL — ABNORMAL HIGH (ref 150–400)
RBC: 3.94 MIL/uL (ref 3.87–5.11)
RDW: 12.8 % (ref 11.5–15.5)
WBC: 9.9 K/uL (ref 4.0–10.5)
nRBC: 0 % (ref 0.0–0.2)

## 2024-02-18 LAB — C-REACTIVE PROTEIN: CRP: 2.9 mg/dL — ABNORMAL HIGH (ref <1.0)

## 2024-02-18 LAB — SEDIMENTATION RATE: Sed Rate: 58 mm/h — ABNORMAL HIGH (ref 0–30)

## 2024-02-18 LAB — MAGNESIUM: Magnesium: 2 mg/dL (ref 1.7–2.4)

## 2024-02-18 MED ORDER — HYDROMORPHONE HCL 1 MG/ML IJ SOLN
1.0000 mg | Freq: Once | INTRAMUSCULAR | Status: AC
  Administered 2024-02-18: 1 mg via INTRAVENOUS
  Filled 2024-02-18: qty 1

## 2024-02-18 MED ORDER — KETOROLAC TROMETHAMINE 15 MG/ML IJ SOLN
15.0000 mg | Freq: Once | INTRAMUSCULAR | Status: AC
  Administered 2024-02-18: 15 mg via INTRAVENOUS
  Filled 2024-02-18: qty 1

## 2024-02-18 MED ORDER — METHOCARBAMOL 500 MG PO TABS
1000.0000 mg | ORAL_TABLET | Freq: Once | ORAL | Status: AC
  Administered 2024-02-18: 1000 mg via ORAL
  Filled 2024-02-18: qty 2

## 2024-02-18 MED ORDER — PREDNISONE 10 MG (21) PO TBPK: ORAL_TABLET | Freq: Every day | ORAL | 0 refills | Status: AC

## 2024-02-18 MED ORDER — DEXAMETHASONE SOD PHOSPHATE PF 10 MG/ML IJ SOLN
10.0000 mg | Freq: Once | INTRAMUSCULAR | Status: AC
  Administered 2024-02-18: 10 mg via INTRAVENOUS

## 2024-02-18 MED ORDER — LIDOCAINE 5 % EX PTCH
1.0000 | MEDICATED_PATCH | CUTANEOUS | Status: DC
  Administered 2024-02-18: 1 via TRANSDERMAL
  Filled 2024-02-18: qty 1

## 2024-02-18 MED ORDER — GADOBUTROL 1 MMOL/ML IV SOLN
7.0000 mL | Freq: Once | INTRAVENOUS | Status: AC | PRN
  Administered 2024-02-18: 7 mL via INTRAVENOUS

## 2024-02-18 NOTE — ED Provider Notes (Signed)
 [place] EMERGENCY DEPARTMENT AT [facility] Provider Note   CSN: [id number] Arrival date & time: 02/18/24  9056     Patient presents with: Back Pain   Amber Drake is a 63 y.o. female.    Back Pain Patient presents for low back pain.  Medical history includes HTN, HLD, lumbar degenerative disc disease, arthritis, anemia.  3 years ago, she underwent laminectomy and foraminotomy of L5-S1 with Dr. Onetha.  She is on chronic 10 mg hydrocodone  every 6 hours.  For the past 2 months, she has been on Robaxin , celecoxib .  She uses lidocaine  patches.  For the past 2 days, she has had worsening severity of her low back pain.  At times, with movement, she will have shooting pains across her lower back.  She denies any radiation into her legs.  She denies any weakness, numbness, or difficulty with using the bathroom.     Prior to Admission medications   Medication Sig Start Date End Date Taking? Authorizing Provider  predniSONE  (STERAPRED UNI-PAK 21 TAB) 10 MG (21) TBPK tablet Take by mouth daily. Take 6 tabs by mouth daily  for 2 days, then 5 tabs for 2 days, then 4 tabs for 2 days, then 3 tabs for 2 days, 2 tabs for 2 days, then 1 tab by mouth daily for 2 days 02/18/24  Yes Melvenia Motto, MD  atorvastatin  (LIPITOR) 20 MG tablet Take 1 tablet (20 mg total) by mouth daily. 03/14/15   Comer Kirsch, PA-C  gabapentin (NEURONTIN) 300 MG capsule Take 300 mg by mouth daily. 08/15/22   [provider]  lisinopril -hydrochlorothiazide  (PRINZIDE ,ZESTORETIC ) 20-12.5 MG tablet Take 1 tablet by mouth daily.    [provider]  meclizine  (ANTIVERT ) 25 MG tablet Take 1 tablet (25 mg total) by mouth 3 (three) times daily as needed for dizziness. 12/12/23   Zammit, Joseph, MD  methocarbamol  (ROBAXIN ) 500 MG tablet Take 1 tablet (500 mg total) by mouth every 8 (eight) hours as needed for muscle spasms. 08/17/23   Mesner, Jason, MD  Multiple Vitamin (MULTIVITAMIN WITH MINERALS) TABS tablet  Take 1 tablet by mouth daily.    [provider]  ondansetron  (ZOFRAN -ODT) 4 MG disintegrating tablet Take 1 tablet (4 mg total) by mouth every 8 (eight) hours as needed for nausea or vomiting. 10/29/23   Roselyn Carlin NOVAK, MD  oxyCODONE -acetaminophen  (PERCOCET) 10-325 MG tablet Take 1 tablet by mouth 3 (three) times daily as needed. 06/21/23   [provider]  pantoprazole  (PROTONIX ) 20 MG tablet Take 1 tablet (20 mg total) by mouth daily. 08/17/23   Mesner, Selinda, MD  potassium chloride  SA (KLOR-CON ) 20 MEQ tablet TAKE (1) TABLET BY MOUTH TWICE DAILY. Patient taking differently: Take 20 mEq by mouth. 01/31/21   Onesimo Emaline Brink, MD  silver  sulfADIAZINE  (SILVADENE ) 1 % cream Apply topically daily. 09/10/23   Signa Delon LABOR, NP  Simethicone  125 MG CAPS One tablet 4 times a day as needed 01/28/24   Triplett, Tammy, PA-C  sulfamethoxazole -trimethoprim  (BACTRIM  DS) 800-160 MG tablet Take 1 tablet by mouth 2 (two) times daily. Take 1 bid 11/25/23   Signa Delon A, NP    Allergies: Penicillins, Penicillins cross reactors, and Tape    Review of Systems  Musculoskeletal:  Positive for back pain.  All other systems reviewed and are negative.   Updated Vital Signs BP 120/63 (BP Location: Right Arm)   Pulse 72   Temp 97.6 F (36.4 C)   Resp 16   Ht 5'  4 (1.626 m)   Wt 73.3 kg   LMP 07/18/2010   SpO2 100%   BMI 27.72 kg/m   Physical Exam Vitals and nursing note reviewed.  Constitutional:      General: She is not in acute distress.    Appearance: Normal appearance. She is well-developed. She is not ill-appearing, toxic-appearing or diaphoretic.  HENT:     Head: Normocephalic and atraumatic.     Right Ear: External ear normal.     Left Ear: External ear normal.     Nose: Nose normal.     Mouth/Throat:     Mouth: Mucous membranes are moist.  Eyes:     Extraocular Movements: Extraocular movements intact.     Conjunctiva/sclera: Conjunctivae normal.   Cardiovascular:     Rate and Rhythm: Normal rate and regular rhythm.  Pulmonary:     Effort: Pulmonary effort is normal. No respiratory distress.  Abdominal:     General: There is no distension.     Palpations: Abdomen is soft.     Tenderness: There is no abdominal tenderness.  Musculoskeletal:        General: Tenderness present. No swelling or deformity.     Cervical back: Normal range of motion and neck supple.  Skin:    General: Skin is warm and dry.     Capillary Refill: Capillary refill takes less than 2 seconds.     Coloration: Skin is not jaundiced or pale.  Neurological:     General: No focal deficit present.     Mental Status: She is alert and oriented to person, place, and time.     Sensory: No sensory deficit.     Motor: No weakness.  Psychiatric:        Mood and Affect: Mood normal.        Behavior: Behavior normal.     (all labs ordered are listed, but only abnormal results are displayed) Labs Reviewed  CBC WITH DIFFERENTIAL/PLATELET - Abnormal; Notable for the following components:      Result Value   Hemoglobin 11.3 (*)    HCT 33.8 (*)    Platelets 408 (*)    All other components within normal limits  SEDIMENTATION RATE - Abnormal; Notable for the following components:   Sed Rate 58 (*)    All other components within normal limits  COMPREHENSIVE METABOLIC PANEL WITH GFR - Abnormal; Notable for the following components:   BUN 6 (*)    All other components within normal limits  MAGNESIUM  C-REACTIVE PROTEIN    EKG: None  Radiology: MR Lumbar Spine W Wo Contrast Result Date: 02/18/2024 EXAM: MRI LUMBAR SPINE 02/18/2024 02:04:22 PM TECHNIQUE: Multiplanar multisequence MRI of the lumbar spine was performed without and with the administration of intravenous contrast. COMPARISON: CT abdomen and pelvis 04/04/2023 and MRI lumbar spine 10/09/2022. CLINICAL HISTORY: Low back pain, symptoms persist with > 6 wks treatment. FINDINGS: BONES AND ALIGNMENT: In  keeping with previous MRI lumbar spine 10/09/2022, there is transitional lumbosacral anatomy with a lumbarized S1 vertebra and a well-formed S1-S2 intervertebral disc. The L5-S1 intervertebral disc is depicted on axial T2 series 8, image 30. There is again overall straightening of the normal lumbar lordosis without significant listhesis. There is again minimal height loss of the L4 vertebral body posteriorly, and of the L5 vertebral body centrally-posteriorly, similar to prior exam. The remaining vertebral body heights are grossly maintained. There is multilevel intervertebral disc signal loss, most pronounced at L4-L5 and L5-S1. Bone marrow signal is  unremarkable. SPINAL CORD: The conus medullaris terminates at the superior L2 level. No abnormal intradural, conus medullaris, or cauda equina nerve root enhancement. SOFT TISSUES: No paraspinal mass. L1-L2: No significant disc herniation. No spinal canal stenosis or neural foraminal narrowing. L2-L3: Mild disc bulge and bilateral facet arthropathy without significant spinal canal stenosis. There is mild bilateral neural foraminal stenosis. Findings are overall similar to prior exam. L3-L4: There is mild disc bulge and mild bilateral facet arthropathy with mild right greater than left bilateral lateral recess stenosis. There is mild right greater than left neural foraminal stenosis bilaterally. Findings are overall similar to prior exam. L4-L5: There is disc bulge, bilateral ligamentum flavum thickening and severe bilateral facet arthropathy with enhancement suggested at juxtasynovial cysts in the dorsal paraspinal soft tissues. Constellation of findings contribute to mild-to-moderate spinal canal and right greater than left lateral recess stenosis. There is mild-to-moderate bilateral neural foraminal stenosis. Findings are overall similar to prior exam. L5-S1: There is disc bulge, bilateral ligamentum flavum thickening and moderate bilateral facet arthropathy with  increased fluid within the left facet joint in particular. There is no significant spinal canal stenosis. Mild bilateral lateral recess stenosis. There is moderate-to-severe neural foraminal stenosis bilaterally. Findings are overall similar to prior exam. S1-S2: No significant disc herniation or spinal canal stenosis. There is no significant neural foraminal stenosis. Findings are similar to prior exam. OTHER FINDINGS: There are bilateral renal T2 hyperintensities most likely representing cysts. IMPRESSION: 1. No acute fracture or malalignment of the lumbar spine. 2. Overall similar multilevel lumbar spondylosis with up to moderate spinal stenosis most pronounced at the L4-L5 level. Multilevel neural foraminal stenosis, detailed above. 3. No abnormal intradural, conus medullaris, or cauda equina nerve root enhancement. Electronically signed by: prentice spade 02/18/2024 03:09 PM EST RP Workstation: [id number]     Procedures   Medications Ordered in the ED  lidocaine  (LIDODERM ) 5 % 1 patch (1 patch Transdermal Patch Applied 02/18/24 1324)  HYDROmorphone  (DILAUDID ) injection 1 mg (1 mg Intravenous Given 02/18/24 1325)  ketorolac  (TORADOL ) 15 MG/ML injection 15 mg (15 mg Intravenous Given 02/18/24 1324)  dexamethasone  (DECADRON ) injection 10 mg (10 mg Intravenous Given 02/18/24 1325)  methocarbamol  (ROBAXIN ) tablet 1,000 mg (1,000 mg Oral Given 02/18/24 1325)  gadobutrol  (GADAVIST ) 1 MMOL/ML injection 7 mL (7 mLs Intravenous Contrast Given 02/18/24 1402)                                    Medical Decision Making Amount and/or Complexity of Data Reviewed Labs: ordered. Radiology: ordered.  Risk Prescription drug management.   Patient presenting for acute on chronic low back pain.  She does have a history of surgery to L5-S1 area 3 years ago with Dr. Onetha.  She is on chronic narcotic pain medication and has been treating her pain with multimodal pain control for the last 2 months.  Despite  this, she has had worsening severity of her pain for the past 2 days.  She denies any recent injury.  On arrival in the ED, vital signs are normal.  On exam, patient is anxious and tearful.  Area pain is the L5 area.  She does not have any neurologic deficits.  She denies any recent red flag symptoms.  Multimodal pain control provided in the ED.  Given the severe nature of her pain, workup to include MRI ordered.  Lab work shows no leukocytosis and age-adjusted-normal ESR.  MRI shows redemonstration  of known degenerative disease in multifocal areas of stenoses.  Patient did have improved symptoms while in the ED.  Patient to continue multimodal pain control.  Will prescribe steroid taper as well.  She was discharged in stable condition.     Final diagnoses:  Bilateral low back pain without sciatica, unspecified chronicity    ED Discharge Orders          Ordered    predniSONE  (STERAPRED UNI-PAK 21 TAB) 10 MG (21) TBPK tablet  Daily        02/18/24 1543               Melvenia Motto, MD 02/18/24 [phone]

## 2024-02-18 NOTE — ED Triage Notes (Signed)
 Pt arrived via POV c/o recurrent Left lower back pain. Pt reports Hx of DDD, Pt denies recent injuries and reports OTC medications have not helped.

## 2024-02-18 NOTE — Discharge Instructions (Addendum)
 Your test results today were reassuring.  Continue your current pain medication regimen.  A prescription for a steroid taper was sent to your pharmacy.  Take this as prescribed as well.  Follow-up with your spine doctor.  Return to the emergency department for any new or worsening symptoms of concern.

## 2024-03-13 ENCOUNTER — Other Ambulatory Visit: Payer: Self-pay | Admitting: Adult Health

## 2024-03-13 ENCOUNTER — Telehealth: Payer: Self-pay | Admitting: Adult Health

## 2024-03-13 MED ORDER — SILVER SULFADIAZINE 1 % EX CREA: TOPICAL_CREAM | Freq: Every day | CUTANEOUS | 1 refills | Status: AC

## 2024-03-13 MED ORDER — SULFAMETHOXAZOLE-TRIMETHOPRIM 800-160 MG PO TABS: 1.0000 | ORAL_TABLET | Freq: Two times a day (BID) | ORAL | 0 refills | Status: AC

## 2024-03-13 NOTE — Telephone Encounter (Signed)
 Patient called stating that she has boils again and wants to know if Jenn will send something in again instead of her coming in. [facility] and asked for a call back.

## 2024-03-13 NOTE — Progress Notes (Signed)
 Refilled septra  ds and silvadene 

## 2024-03-13 NOTE — Telephone Encounter (Signed)
 Pt has boils and is requesting med. I spoke with JAG. Med will be sent to [facility]. Pt aware. JSY

## 2024-03-24 ENCOUNTER — Other Ambulatory Visit: Payer: Self-pay

## 2024-03-24 ENCOUNTER — Emergency Department (HOSPITAL_COMMUNITY)
Admission: EM | Admit: 2024-03-24 | Discharge: 2024-03-24 | Disposition: A | Discharge status: Home / Self Care | Attending: Emergency Medicine | Admitting: Emergency Medicine

## 2024-03-24 ENCOUNTER — Encounter (HOSPITAL_COMMUNITY): Payer: Self-pay | Admitting: Emergency Medicine

## 2024-03-24 DIAGNOSIS — R350 Frequency of micturition: Secondary | ICD-10-CM | POA: Insufficient documentation

## 2024-03-24 DIAGNOSIS — Z79899 Other long term (current) drug therapy: Secondary | ICD-10-CM | POA: Insufficient documentation

## 2024-03-24 DIAGNOSIS — K5900 Constipation, unspecified: Secondary | ICD-10-CM | POA: Diagnosis not present

## 2024-03-24 DIAGNOSIS — R319 Hematuria, unspecified: Secondary | ICD-10-CM | POA: Insufficient documentation

## 2024-03-24 DIAGNOSIS — R1013 Epigastric pain: Secondary | ICD-10-CM | POA: Diagnosis present

## 2024-03-24 DIAGNOSIS — R634 Abnormal weight loss: Secondary | ICD-10-CM | POA: Diagnosis not present

## 2024-03-24 LAB — URINALYSIS, W/ REFLEX TO CULTURE (INFECTION SUSPECTED)
Bilirubin Urine: NEGATIVE
Glucose, UA: NEGATIVE mg/dL
Ketones, ur: NEGATIVE mg/dL
Leukocytes,Ua: NEGATIVE
Nitrite: NEGATIVE
Protein, ur: NEGATIVE mg/dL
Specific Gravity, Urine: 1.004 — ABNORMAL LOW (ref 1.005–1.030)
pH: 6 (ref 5.0–8.0)

## 2024-03-24 LAB — CBC WITH DIFFERENTIAL/PLATELET
Abs Immature Granulocytes: 0.03 K/uL (ref 0.00–0.07)
Basophils Absolute: 0 K/uL (ref 0.0–0.1)
Basophils Relative: 0 %
Eosinophils Absolute: 0.1 K/uL (ref 0.0–0.5)
Eosinophils Relative: 1 %
HCT: 38.4 % (ref 36.0–46.0)
Hemoglobin: 13.2 g/dL (ref 12.0–15.0)
Immature Granulocytes: 0 %
Lymphocytes Relative: 44 %
Lymphs Abs: 3.9 K/uL (ref 0.7–4.0)
MCH: 28.4 pg (ref 26.0–34.0)
MCHC: 34.4 g/dL (ref 30.0–36.0)
MCV: 82.8 fL (ref 80.0–100.0)
Monocytes Absolute: 0.7 K/uL (ref 0.1–1.0)
Monocytes Relative: 7 %
Neutro Abs: 4.1 K/uL (ref 1.7–7.7)
Neutrophils Relative %: 48 %
Platelets: 469 K/uL — ABNORMAL HIGH (ref 150–400)
RBC: 4.64 MIL/uL (ref 3.87–5.11)
RDW: 13.1 % (ref 11.5–15.5)
WBC: 8.8 K/uL (ref 4.0–10.5)
nRBC: 0 % (ref 0.0–0.2)

## 2024-03-24 LAB — COMPREHENSIVE METABOLIC PANEL WITH GFR
ALT: 11 U/L (ref 0–44)
AST: 15 U/L (ref 15–41)
Albumin: 4.2 g/dL (ref 3.5–5.0)
Alkaline Phosphatase: 73 U/L (ref 38–126)
Anion gap: 9 (ref 5–15)
BUN: 14 mg/dL (ref 8–23)
CO2: 27 mmol/L (ref 22–32)
Calcium: 9.6 mg/dL (ref 8.9–10.3)
Chloride: 98 mmol/L (ref 98–111)
Creatinine, Ser: 1.01 mg/dL — ABNORMAL HIGH (ref 0.44–1.00)
GFR, Estimated: >60 mL/min
Glucose, Bld: 106 mg/dL — ABNORMAL HIGH (ref 70–99)
Potassium: 3.9 mmol/L (ref 3.5–5.1)
Sodium: 134 mmol/L — ABNORMAL LOW (ref 135–145)
Total Bilirubin: 0.2 mg/dL (ref 0.0–1.2)
Total Protein: 7 g/dL (ref 6.5–8.1)

## 2024-03-24 LAB — LIPASE, BLOOD: Lipase: 29 U/L (ref 11–51)

## 2024-03-24 NOTE — ED Triage Notes (Signed)
 Pt c/o abd pain with urinary frequency and states she has blood in urine a times.

## 2024-03-24 NOTE — ED Provider Notes (Signed)
 "  [place] EMERGENCY DEPARTMENT AT [facility]  Provider Note  CSN: [id number] Arrival date & time: 03/24/24 0245  History Chief Complaint  Patient presents with   Abdominal Pain    Amber Drake is a 63 y.o. female reports 2 weeks of epigastric pain and weight loss. She reports some constipation, no N/V. She has had some urinary frequency and hematuria but that is chronic for her. She is scheduled to see her PCP in a few hours but decided to come to the ED tonight to make sure she was alright.   Home Medications Prior to Admission medications  Medication Sig Start Date End Date Taking? Authorizing Provider  atorvastatin  (LIPITOR) 20 MG tablet Take 1 tablet (20 mg total) by mouth daily. 03/14/15   Comer Kirsch, PA-C  gabapentin (NEURONTIN) 300 MG capsule Take 300 mg by mouth daily. 08/15/22   [provider]  lisinopril -hydrochlorothiazide  (PRINZIDE ,ZESTORETIC ) 20-12.5 MG tablet Take 1 tablet by mouth daily.    [provider]  meclizine  (ANTIVERT ) 25 MG tablet Take 1 tablet (25 mg total) by mouth 3 (three) times daily as needed for dizziness. 12/12/23   Zammit, Joseph, MD  methocarbamol  (ROBAXIN ) 500 MG tablet Take 1 tablet (500 mg total) by mouth every 8 (eight) hours as needed for muscle spasms. 08/17/23   Mesner, Jason, MD  Multiple Vitamin (MULTIVITAMIN WITH MINERALS) TABS tablet Take 1 tablet by mouth daily.    [provider]  ondansetron  (ZOFRAN -ODT) 4 MG disintegrating tablet Take 1 tablet (4 mg total) by mouth every 8 (eight) hours as needed for nausea or vomiting. 10/29/23   Roselyn Carlin NOVAK, MD  oxyCODONE -acetaminophen  (PERCOCET) 10-325 MG tablet Take 1 tablet by mouth 3 (three) times daily as needed. 06/21/23   [provider]  pantoprazole  (PROTONIX ) 20 MG tablet Take 1 tablet (20 mg total) by mouth daily. 08/17/23   Mesner, Selinda, MD  potassium chloride  SA (KLOR-CON ) 20 MEQ tablet TAKE (1) TABLET BY MOUTH TWICE DAILY. Patient  taking differently: Take 20 mEq by mouth. 01/31/21   Onesimo Emaline Brink, MD  predniSONE  (STERAPRED UNI-PAK 21 TAB) 10 MG (21) TBPK tablet Take by mouth daily. Take 6 tabs by mouth daily  for 2 days, then 5 tabs for 2 days, then 4 tabs for 2 days, then 3 tabs for 2 days, 2 tabs for 2 days, then 1 tab by mouth daily for 2 days 02/18/24   Melvenia Motto, MD  silver  sulfADIAZINE  (SILVADENE ) 1 % cream Apply topically daily. 03/13/24   Signa Delon LABOR, NP  Simethicone  125 MG CAPS One tablet 4 times a day as needed 01/28/24   Triplett, Tammy, PA-C  sulfamethoxazole -trimethoprim  (BACTRIM  DS) 800-160 MG tablet Take 1 tablet by mouth 2 (two) times daily. Take 1 bid 03/13/24   Signa Delon A, NP     Allergies    Penicillins, Penicillins cross reactors, and Tape   Review of Systems   Review of Systems Please see HPI for pertinent positives and negatives  Physical Exam BP (!) 142/78   Pulse 72   Temp 98.1 F (36.7 C) (Oral)   Resp 16   Ht 5' 4 (1.626 m)   Wt 73.3 kg   LMP 08/12/2010   SpO2 100%   BMI 27.74 kg/m   Physical Exam Vitals and nursing note reviewed.  Constitutional:      Appearance: Normal appearance.  HENT:     Head: Normocephalic and atraumatic.     Nose: Nose normal.  Mouth/Throat:     Mouth: Mucous membranes are moist.  Eyes:     Extraocular Movements: Extraocular movements intact.     Conjunctiva/sclera: Conjunctivae normal.  Cardiovascular:     Rate and Rhythm: Normal rate.  Pulmonary:     Effort: Pulmonary effort is normal.     Breath sounds: Normal breath sounds.  Abdominal:     General: Abdomen is flat.     Palpations: Abdomen is soft.     Tenderness: There is abdominal tenderness in the epigastric area. There is no guarding. Negative signs include Murphy's sign and McBurney's sign.  Musculoskeletal:        General: No swelling. Normal range of motion.     Cervical back: Neck supple.  Skin:    General: Skin is warm and dry.  Neurological:      General: No focal deficit present.     Mental Status: She is alert.  Psychiatric:        Mood and Affect: Mood normal.     ED Results / Procedures / Treatments   EKG None  Procedures Procedures  Medications Ordered in the ED Medications - No data to display  Initial Impression and Plan  Patient here with nonspecific abdominal pain, benign exam. Reassuring vitals. Will check labs defer imaging for now pending results.   ED Course   Clinical Course as of 03/24/24 0357  Tue Mar 24, 2024  0330 UA with some blood but no infection.  [CS]  0338 CBC is unremarkable. [CS]  0355 CMP and lipase are unremarkable. No emergent or life-threatening cause of symptoms identified. Recommend continued evaluation and management with PCP as scheduled in a few hours from now.  [CS]    Clinical Course User Index [CS] Roselyn Carlin NOVAK, MD     MDM Rules/Calculators/A&P Medical Decision Making Problems Addressed: Epigastric pain: acute illness or injury  Amount and/or Complexity of Data Reviewed Labs: ordered. Decision-making details documented in ED Course.     Final Clinical Impression(s) / ED Diagnoses Final diagnoses:  Epigastric pain    Rx / DC Orders ED Discharge Orders     None        Roselyn Carlin NOVAK, MD 03/24/24 [phone]  "

## 2024-03-25 ENCOUNTER — Other Ambulatory Visit: Payer: Self-pay | Admitting: Adult Health

## 2024-08-26 ENCOUNTER — Ambulatory Visit: Admitting: Hematology

## 2024-08-26 ENCOUNTER — Other Ambulatory Visit
